# Patient Record
Sex: Male | Born: 1935 | Race: White | Hispanic: No | Marital: Married | Smoking: Former smoker
Health system: Southern US, Community
[De-identification: ages and names within clinical notes are randomized; demographics above are authoritative.]

## PROBLEM LIST (undated history)

## (undated) DIAGNOSIS — I714 Abdominal aortic aneurysm, without rupture: Secondary | ICD-10-CM

## (undated) DIAGNOSIS — R1310 Dysphagia, unspecified: Secondary | ICD-10-CM

## (undated) DIAGNOSIS — N400 Enlarged prostate without lower urinary tract symptoms: Secondary | ICD-10-CM

## (undated) DIAGNOSIS — I10 Essential (primary) hypertension: Secondary | ICD-10-CM

## (undated) DIAGNOSIS — I428 Other cardiomyopathies: Secondary | ICD-10-CM

## (undated) DIAGNOSIS — Z931 Gastrostomy status: Secondary | ICD-10-CM

## (undated) DIAGNOSIS — R7881 Bacteremia: Secondary | ICD-10-CM

## (undated) DIAGNOSIS — I82409 Acute embolism and thrombosis of unspecified deep veins of unspecified lower extremity: Secondary | ICD-10-CM

## (undated) DIAGNOSIS — R001 Bradycardia, unspecified: Secondary | ICD-10-CM

## (undated) DIAGNOSIS — C801 Malignant (primary) neoplasm, unspecified: Secondary | ICD-10-CM

## (undated) DIAGNOSIS — B965 Pseudomonas (aeruginosa) (mallei) (pseudomallei) as the cause of diseases classified elsewhere: Secondary | ICD-10-CM

## (undated) DIAGNOSIS — I498 Other specified cardiac arrhythmias: Secondary | ICD-10-CM

## (undated) DIAGNOSIS — T827XXA Infection and inflammatory reaction due to other cardiac and vascular devices, implants and grafts, initial encounter: Secondary | ICD-10-CM

## (undated) DIAGNOSIS — I359 Nonrheumatic aortic valve disorder, unspecified: Secondary | ICD-10-CM

## (undated) DIAGNOSIS — E785 Hyperlipidemia, unspecified: Secondary | ICD-10-CM

## (undated) HISTORY — PX: TRANSURETHRAL RESECTION OF PROSTATE: SHX73

## (undated) HISTORY — PX: HEMORRHOID SURGERY: SHX153

## (undated) HISTORY — PX: APPENDECTOMY: SHX54

## (undated) HISTORY — PX: JOINT REPLACEMENT: SHX530

## (undated) HISTORY — DX: Malignant (primary) neoplasm, unspecified: C80.1

## (undated) HISTORY — DX: Abdominal aortic aneurysm, without rupture: I71.4

## (undated) HISTORY — PX: HERNIA REPAIR: SHX51

## (undated) HISTORY — PX: COLECTOMY: SHX59

## (undated) HISTORY — PX: CATARACT EXTRACTION, BILATERAL: SHX1313

## (undated) HISTORY — PX: PROSTATE BIOPSY: SHX241

---

## 1988-03-15 HISTORY — PX: TOTAL KNEE ARTHROPLASTY: SHX125

## 1998-01-30 ENCOUNTER — Other Ambulatory Visit: Admission: RE | Admit: 1998-01-30 | Discharge: 1998-01-30 | Payer: Self-pay | Admitting: Urology

## 1998-03-28 ENCOUNTER — Ambulatory Visit (HOSPITAL_COMMUNITY): Admission: RE | Admit: 1998-03-28 | Discharge: 1998-03-28 | Payer: Self-pay | Admitting: Orthopedic Surgery

## 1998-03-28 ENCOUNTER — Encounter: Payer: Self-pay | Admitting: Orthopedic Surgery

## 1998-04-21 ENCOUNTER — Encounter: Admission: RE | Admit: 1998-04-21 | Discharge: 1998-07-20 | Payer: Self-pay | Admitting: Anesthesiology

## 2000-12-02 ENCOUNTER — Encounter (INDEPENDENT_AMBULATORY_CARE_PROVIDER_SITE_OTHER): Payer: Self-pay | Admitting: Specialist

## 2000-12-02 ENCOUNTER — Other Ambulatory Visit: Admission: RE | Admit: 2000-12-02 | Discharge: 2000-12-02 | Payer: Self-pay | Admitting: Urology

## 2001-07-10 ENCOUNTER — Encounter (INDEPENDENT_AMBULATORY_CARE_PROVIDER_SITE_OTHER): Payer: Self-pay

## 2001-07-10 ENCOUNTER — Ambulatory Visit (HOSPITAL_COMMUNITY): Admission: RE | Admit: 2001-07-10 | Discharge: 2001-07-10 | Payer: Self-pay | Admitting: *Deleted

## 2002-01-11 ENCOUNTER — Emergency Department (HOSPITAL_COMMUNITY): Admission: EM | Admit: 2002-01-11 | Discharge: 2002-01-11 | Payer: Self-pay | Admitting: Emergency Medicine

## 2002-12-26 ENCOUNTER — Encounter (INDEPENDENT_AMBULATORY_CARE_PROVIDER_SITE_OTHER): Payer: Self-pay | Admitting: *Deleted

## 2002-12-26 ENCOUNTER — Ambulatory Visit (HOSPITAL_COMMUNITY): Admission: RE | Admit: 2002-12-26 | Discharge: 2002-12-26 | Payer: Self-pay | Admitting: *Deleted

## 2003-02-15 ENCOUNTER — Encounter (INDEPENDENT_AMBULATORY_CARE_PROVIDER_SITE_OTHER): Payer: Self-pay | Admitting: Specialist

## 2003-02-15 ENCOUNTER — Inpatient Hospital Stay (HOSPITAL_COMMUNITY): Admission: RE | Admit: 2003-02-15 | Discharge: 2003-02-19 | Payer: Self-pay | Admitting: Urology

## 2004-02-03 ENCOUNTER — Emergency Department (HOSPITAL_COMMUNITY): Admission: EM | Admit: 2004-02-03 | Discharge: 2004-02-03 | Payer: Self-pay | Admitting: Emergency Medicine

## 2004-06-25 ENCOUNTER — Ambulatory Visit (HOSPITAL_COMMUNITY): Admission: RE | Admit: 2004-06-25 | Discharge: 2004-06-25 | Payer: Self-pay | Admitting: *Deleted

## 2004-06-25 ENCOUNTER — Encounter (INDEPENDENT_AMBULATORY_CARE_PROVIDER_SITE_OTHER): Payer: Self-pay | Admitting: *Deleted

## 2004-08-31 ENCOUNTER — Emergency Department (HOSPITAL_COMMUNITY): Admission: EM | Admit: 2004-08-31 | Discharge: 2004-08-31 | Payer: Self-pay | Admitting: *Deleted

## 2006-03-15 HISTORY — PX: BLADDER SURGERY: SHX569

## 2006-10-22 ENCOUNTER — Emergency Department (HOSPITAL_COMMUNITY): Admission: EM | Admit: 2006-10-22 | Discharge: 2006-10-22 | Payer: Self-pay | Admitting: Emergency Medicine

## 2006-12-05 ENCOUNTER — Ambulatory Visit (HOSPITAL_COMMUNITY): Admission: RE | Admit: 2006-12-05 | Discharge: 2006-12-05 | Payer: Self-pay | Admitting: Urology

## 2006-12-12 ENCOUNTER — Ambulatory Visit: Admission: RE | Admit: 2006-12-12 | Discharge: 2007-01-10 | Payer: Self-pay | Admitting: Radiation Oncology

## 2007-02-02 ENCOUNTER — Ambulatory Visit: Admission: RE | Admit: 2007-02-02 | Discharge: 2007-03-15 | Payer: Self-pay | Admitting: Radiation Oncology

## 2007-02-28 LAB — CBC WITH DIFFERENTIAL/PLATELET
BASO%: 0.6 % (ref 0.0–2.0)
EOS%: 4.6 % (ref 0.0–7.0)
LYMPH%: 28.6 % (ref 14.0–48.0)
MCHC: 34 g/dL (ref 32.0–35.9)
MCV: 84.4 fL (ref 81.6–98.0)
MONO%: 11.5 % (ref 0.0–13.0)
Platelets: 153 10*3/uL (ref 145–400)
RBC: 5.21 10*6/uL (ref 4.20–5.71)
WBC: 5.4 10*3/uL (ref 4.0–10.0)

## 2007-03-16 ENCOUNTER — Ambulatory Visit: Admission: RE | Admit: 2007-03-16 | Discharge: 2007-05-16 | Payer: Self-pay | Admitting: Radiation Oncology

## 2010-04-17 ENCOUNTER — Ambulatory Visit: Payer: Medicare Other | Admitting: Urology

## 2010-04-17 DIAGNOSIS — R350 Frequency of micturition: Secondary | ICD-10-CM

## 2010-04-17 DIAGNOSIS — Z8546 Personal history of malignant neoplasm of prostate: Secondary | ICD-10-CM

## 2010-04-17 DIAGNOSIS — R3129 Other microscopic hematuria: Secondary | ICD-10-CM

## 2010-07-31 NOTE — Procedures (Signed)
[facility]  Patient:    ADRIANA, LINA Visit Number: [id number] MRN: [id number]          Service Type: END Location: ENDO Attending Physician:  Sabino Gasser Dictated by:   Sabino Gasser, M.D. Proc. Date: 07/10/01 Admit Date:  07/10/2001                             Procedure Report  PROCEDURE:  Upper endoscopy.  INDICATION:  Gastroesophageal reflux disease.  ANESTHESIA:  Demerol 50, Versed 6 mg.  DESCRIPTION OF PROCEDURE:  With the patient mildly sedated in the left lateral decubitus position, the Olympus videoscopic endoscope was inserted into the mouth and passed under direct vision through the esophagus, which appeared normal, into the stomach.  Fundus, body, antrum, duodenal bulb, and second portion of duodenum were all visualized.  From this point, the endoscope was slowly withdrawn, taking circumferential views of the entire duodenal mucosa until the endoscope then pulled back into the stomach and placed in retroflexion to view the stomach from below.  The endoscope was then straightened and withdrawn, taking circumferential views of the remaining gastric and esophageal mucosa.  The patients vital signs and pulse oximeter remained stable.  The patient tolerated the procedure well without apparent complications.  FINDINGS:  Minimal erythema of duodenal bulb, otherwise unremarkable examination.  PLAN:  Proceed to colonoscopy. Dictated by:   Sabino Gasser, M.D. Attending Physician:  Sabino Gasser DD:  07/10/01 TD:  07/10/01 Job: [id number]

## 2010-07-31 NOTE — H&P (Signed)
NAME:  [name], POMPLUN NO.:  [id number]   MEDICAL RECORD NO.:  [id number]                   PATIENT TYPE:  INP   LOCATION:  0381                                 FACILITY:  [facility]   PHYSICIAN:  Jamison Neighbor, M.D.               DATE OF BIRTH:  07-08-1929   DATE OF ADMISSION:  02/15/2003  DATE OF DISCHARGE:                                HISTORY & PHYSICAL   CHIEF COMPLAINT:  Large prostate.   HISTORY OF PRESENT ILLNESS:  [name] is a 77 year old gentleman who has  been followed by Dr. Logan Bores for elevated PSA as well as lower urinary tract  symptoms.  The patient has a history of an elevated PSA and has undergone a  transrectal ultrasound-guided biopsy in the past.  He was found to have an  80 g prostate.  Recently the patient has complained of increasing dysuria  and frequency as well as lower urinary tract symptoms.  He also was found to  have some red cells in his urine and underwent a CT scan as part of a  hematuria workup.  The CT scan demonstrated an approximately 2 cm bladder  calculus.  Because of the patient's enlarged, prostate, lower urinary tract  symptoms, and bladder calculus, it was recommended to him that he undergo  transurethral resection of the prostate with stone removal.  The patient  consented to this and is being admitted today postoperatively for his  convalescence.   PAST MEDICAL HISTORY:  1. Morbid obesity.  2. Arthritis.  3. History of kidney stones.   PAST SURGICAL HISTORY:  1. Hemorrhoidectomy.  2. Appendectomy.  3. Four hernia surgeries.  4. Colectomy for diverticulitis.   MEDICATIONS:  1. Zocor.  2. Cardura.  3. TriCor.  4. The patient has stopped aspirin.   ALLERGIES:  PENICILLIN causes a rash.   SOCIAL HISTORY:  The patient has a significant smoking history but quit 25  years ago.   FAMILY HISTORY:  No known genitourinary disease.   REVIEW OF SYSTEMS:  As per history of present illness, otherwise  negative  times all systems.   PHYSICAL EXAMINATION:  GENERAL:  Alert and oriented x3, morbid obesity, no  acute distress.  HEENT:  Head is normocephalic, atraumatic.  Extraocular movements are  intact.  Pupils are anicteric.  NECK:  Supple without masses.  CHEST:  Lungs are clear to auscultation bilaterally.  CARDIAC:  Regular rate and rhythm without murmurs, gallops, or rubs.  ABDOMEN:  Soft, nontender, nondistended.  There are multiple scars.  GENITOURINARY:  The patient has an enlarge prostate without nodules or  induration.  EXTREMITIES:  Warm and well-perfused.  NEUROLOGIC:  No focal motor or sensory deficits are appreciated.   IMPRESSION:  A 77 year old with lower urinary tract symptoms and bladder  calculus.   PLAN:  The patient is to undergo transurethral resection of the prostate  with bladder  calculus removal.  He will be admitted to Dr. Logan Bores  postoperatively for his convalescence.     Susanne Borders, MD                           Jamison Neighbor, M.D.    DR/MEDQ  D:  02/15/2003  T:  02/15/2003  Job:  [id number]

## 2010-07-31 NOTE — Op Note (Signed)
   NAME:  [name], [name] NO.:  [id number]   MEDICAL RECORD NO.:  [id number]                   PATIENT TYPE:  AMB   LOCATION:  ENDO                                 FACILITY:  [facility]   PHYSICIAN:  Georgiana Spinner, M.D.                 DATE OF BIRTH:  01-29-1930   DATE OF PROCEDURE:  12/26/2002  DATE OF DISCHARGE:                                 OPERATIVE REPORT   PROCEDURE:  Colonoscopy.   INDICATIONS:  Colon polyps.   ANESTHESIA:  Demerol 20 mg, Versed 1 mg.   DESCRIPTION OF PROCEDURE:  With the patient mildly sedated and in the left  lateral decubitus position, the Olympus videoscopic colonoscope was inserted  in the rectum and passed under direct vision to the cecum, identified by  ileocecal valve and appendiceal orifice, both of which were photographed.  From this point,  the colonoscope was slowly withdrawn, taking  circumferential views of the colonic mucosa as we withdrew all the way to  the rectum, stopping only to photograph diverticula seen along the way, both  in the right and left colon and polyps that we saw mostly in the left colon.  Two in the sigmoid colon were adjacent.  One was removed using snare cautery  technique.  The other hot biopsy forceps technique, both the same 20/20  blended current and both were retrieved for pathology and placed in the same  specimen container.  Third polyp was seen at 20 cm from the anal verge and  it, too, was removed this time using hot biopsy forceps technique, a setting  of 20/20 blended current.  The endoscope was withdrawn into the rectum which  appeared normal and direct and retroflexed view showed hemorrhoids.  The  endoscope was straightened and withdrawn.  The patient's vital signs and  pulse oximetry remained stable.  The patient tolerated the procedure well  without apparent complications.   FINDINGS:  1. Hemorrhoids internally.  2. Diverticulosis, mild but noted throughout the colon, right  and left     equally.  3. Polyps as described above in the sigmoid colon at 20 cm from the anal     verge.   PLAN:  Await biopsy report.  The patient will call me for results and follow  up with me as an outpatient.                                               Georgiana Spinner, M.D.    GMO/MEDQ  D:  12/26/2002  T:  12/26/2002  Job:  [id number]

## 2010-07-31 NOTE — Op Note (Signed)
NAME:  [name], [name] NO.:  [id number]   MEDICAL RECORD NO.:  [id number]          PATIENT TYPE:  AMB   LOCATION:  ENDO                         FACILITY:  [facility]   PHYSICIAN:  Georgiana Spinner, M.D.    DATE OF BIRTH:  1930-01-08   DATE OF PROCEDURE:  DATE OF DISCHARGE:                                 OPERATIVE REPORT   PROCEDURE:  Colonoscopy.   ANESTHESIA:  Demerol 25, Versed 2 mg.   INDICATIONS:  Rectal bleeding.   PROCEDURE IN DETAIL:  With the patient mildly sedated in the left lateral  decubitus position, a rectal exam was performed which was unremarkable to my  view.  Subsequently the Olympus videoscopic colonoscope was inserted in the  rectum and passed rather easily to the cecum under direct vision.  The cecum  was identified by ileocecal valve and base of cecum both of which were  photographed.  From this point the colonoscope was slowly withdrawn.  __________ colonic mucosa stopping in the rectum which appeared normal and  showed hemorrhoidal tissue.  On retroflex view, the endoscope was straight  and withdrawn.  The patient's vital signs, pulse oximeter remained stable.  The patient tolerated the procedure well. There were no apparent  complications.   FINDINGS:  An occasional diverticulum was seen in the sigmoid colon and  internal hemorrhoids.  Otherwise an unremarkable colonoscope examination to  the cecum.   PLAN:  Consider repeat examination in 5 or more likely 10 years.      GMO/MEDQ  D:  06/25/2004  T:  06/25/2004  Job:  [id number]

## 2010-07-31 NOTE — Op Note (Signed)
NAME:  [name], [name] NO.:  [id number]   MEDICAL RECORD NO.:  [id number]                   PATIENT TYPE:  INP   LOCATION:  0381                                 FACILITY:  [facility]   PHYSICIAN:  Jamison Neighbor, M.D.               DATE OF BIRTH:  [birth year]   DATE OF PROCEDURE:  02/15/2003  DATE OF DISCHARGE:                                 OPERATIVE REPORT   PREOPERATIVE DIAGNOSES:  Benign prostatic hyperplasia/bladder outlet  obstruction, bladder calculus.   POSTOPERATIVE DIAGNOSES:  Benign prostatic hyperplasia/bladder outlet  obstruction, bladder calculus.   PROCEDURE:  Cystoscopy, transurethral resection of prostate, open cystostomy  with stone extraction and suprapubic tube placement.   SURGEON:  Jamison Neighbor, M.D.   ASSISTANT:  Susanne Borders, MD   ANESTHESIA:  General endotracheal.   DRAINS:  1. 24 French Foley catheter to straight drain.  2. 20 French suprapubic tube connected to continuous bladder irrigation.  3. Estimated blood loss 50 mL.   SPECIMENS:  1. Transurethral resection of the prostate, chips for pathologic analysis.  2. Bladder calculus.   COMPLICATIONS:  None.   DISPOSITION:  Post anesthesia care unit in stable condition.   INDICATIONS FOR PROCEDURE:  Mr. Gains is a 77 year old gentleman with a  long standing history of lower urinary tract symptoms.  The patient has a  known enlarged prostate from transrectal ultrasound in the past.  His  prostate is over 80 g in size.  The patient complains of urgency and  frequency and difficulty emptying his bladder.  The patient had some red  cells in his urine and was evaluated with a CT scan as part of his hematuria  workup.  Noted on the CT scan was an approximately 2 cm bladder calculus.  Because of the patient's history of lower urinary tract symptoms and known  bladder calculus with enlarged prostate. A transurethral resection of the  prostate with cystolithalopaxy was  offered to the patient.  He consented to  this after understanding the risks, benefits, and alternatives.   DESCRIPTION OF PROCEDURE:  The patient was brought to the operating room and  correctly identified by his identification bracelet. He was given  preoperative antibiotics and general endotracheal anesthesia.  He was placed  in the dorsal lithotomy position and genitalia were prepped and draped in  typical sterile fashion.  The resectoscope sheath was placed carefully  through the patient's urethra and into his bladder using the obturator.  The  12 degree lens resectoscope was connected to the sheath and the patient's  cystoscopy was performed. Bilateral ureteral orifices were seen in their  anatomic location.  The patient had marked trabeculation throughout his  bladder.  There were no mucosal abnormalities, tumors, or other  abnormalities.  Noted on the floor of the bladder was an approximately 2 cm  bladder calculus.  The adaptor was placed on the resectoscope sheath and the  cystoscope was placed within the resectoscope sheath.  The electrohydraulic  lithotripsy probe was then used and attempts were made to fragment the  calculus cystoscopically.  The stone was found to be very dense and was not  fragmenting whatsoever even with the electrohydraulic lithotriptor on  maximum setting.  At this point, a decision was made that the patient would  need to have an open cystostomy to remove the calculus.  Dr. Logan Bores had a  conversation with the patient's wife who gave consent for that procedure.   The transurethral resection of prostate was performed cystoscopically.  The  resectoscope was replaced within the sheath and the median lobe was  systematically taken down from the bladder neck to just proximal to the  verumontanum.  The tissue was resected down to the capsule an all vessels  were coagulated with a coagulation current. Next attention was turned to the  left lateral lobe and this  was taken down from approximately 2 o'clock to  the 6 o'clock position.  Again the adenoma was resected down to the level of  the capsule.  Care was taken not to resect pass the verumontanum.  This  process was repeated on the right side from approximately 6 o'clock to 11  o'clock.  Following the resection all sites of bleeding were carefully  fulgurated.  There was no damage to the external sphincter as the resection  did not pass the level of the verumontanum. The ureteral orifices were  visualized after resection and were intact.  All bleeding that was noted was  carefully coagulated with the coagulation current.  The chips were removed  through the resectoscope with a Toomey syringe.  The resectoscope was  removed and a 24 French Foley catheter was placed within the bladder.   The patient was then taken out of the lithotomy position and placed in  supine position.  He was shaved, prepped and draped above his pubis to his  umbilicus.  An approximately 8 cm infraumbilical midline incision was with a  scalpel.  The Bovie electrocautery was used to incise Camper's and Scarpa's  fascia down to the level of the rectus sheath.  The rectus sheath was  grasped on both sides with Kelly clamps and was incised in the midline with  Metzenbaum scissors for fear that the patient might have underlying bowel  beneath it. There was known bowel noted.  The fascia was incised down to the  level of the pubis.  The patient's bladder was filled slightly through his  Foley catheter with normal saline.  The bladder could be palpated and the  Foley balloon could be palpated.  Babcock clamps were placed on either side  of the midline of the bladder and an approximately 4 cm cystotomy incision  was made with the  Bovie electrocautery.  The stone was then visualized and  was easily pulled from the bladder and passed off the table.  A small stab  wound was made just inferior to our skin incision and a Vanderbilt  clamp was passed through the body wall and out of this incision.  The 20 French Foley  catheter was grasped and pulled through this incision.  A second small  incision was made in the bladder to the right of the midline cystotomy  incision.  A Vanderbilt was passed through the bladder wall and the Foley  catheter was pulled into the bladder.  The suprapubic tube was inflated with  approximately 6 mL of sterile water.  The  cystostomy incision was then  closed in two running layers of 2-0 Vicryl.  The suprapubic tube was  irrigated and the cystotomy closure was water tight. The entire wound was  then copiously irrigated and no further bleeding was noted.  Kocher clamps  were placed on either side of the rectus fascia and the fascia was closed  with a running #0 Vicryl suture.  There was no obvious fascial defect after  closure.  The skin was then irrigated once more with saline and the skin was  closed with a stapling device.  The suprapubic tube was connected to  continuous bladder irrigation and the Foley catheter was connected to  straight drain. The patient was then awakened from his anesthesia without  complications and taken to the post anesthesia care unit in stable  condition.  Please note that Dr. Logan Bores was present and participated in all  aspects of this case as he was the primary surgeon.     Susanne Borders, MD                           Jamison Neighbor, M.D.    DR/MEDQ  D:  02/15/2003  T:  02/15/2003  Job:  [id number]

## 2010-07-31 NOTE — Op Note (Signed)
   NAME:  [name], [name] NO.:  [id number]   MEDICAL RECORD NO.:  [id number]                   PATIENT TYPE:  AMB   LOCATION:  ENDO                                 FACILITY:  [facility]   PHYSICIAN:  Georgiana Spinner, M.D.                 DATE OF BIRTH:  04-Feb-1930   DATE OF PROCEDURE:  12/26/2002  DATE OF DISCHARGE:                                 OPERATIVE REPORT   PROCEDURE:  Upper endoscopy.   INDICATIONS:  GERD.   ANESTHESIA:  Demerol 60 mg, Versed 6 mg.   DESCRIPTION OF PROCEDURE:  With the patient mildly sedated in the left  lateral decubitus position, the Olympus videoscopic endoscope was inserted  in the mouth, passed under direct vision through the esophagus, which showed  changes of Barrett's esophagus distally.  Photographs and biopsies were  taken.  From this point the endoscope was then advanced into the stomach.  Fundus, body, antrum, duodenal bulb, second portion of duodenum were  visualized.  From this point the endoscope was slowly withdrawn, taking  circumferential views of the duodenal mucosa until the endoscope had been  pulled back in the stomach, placed in retroflexion to view the stomach from  below.  The endoscope was then straightened and withdrawn, taking  circumferential views of the remaining gastric and esophageal mucosa.  The  patient's vital signs and pulse oximetry remained stable.  The patient  tolerated the procedure well without apparent complications.   FINDINGS:  Barrett's esophagus, otherwise unremarkable exam.   PLAN:  Await biopsy report.  The patient will call me for results and follow  up with me as an outpatient.  Proceed to colonoscopy as planned.                                               Georgiana Spinner, M.D.    GMO/MEDQ  D:  12/26/2002  T:  12/26/2002  Job:  [id number]

## 2010-07-31 NOTE — Procedures (Signed)
[facility]  Patient:    [name], [name] Visit Number: [id number] MRN: [id number]          Service Type: END Location: ENDO Attending Physician:  Sabino Gasser Dictated by:   Sabino Gasser, M.D. Proc. Date: 07/10/01 Admit Date:  07/10/2001                             Procedure Report  PROCEDURE:  Colonoscopy.  INDICATION:  Colon polyp.  ANESTHESIA:  Demerol 10, Versed 1 mg.  DESCRIPTION OF PROCEDURE:  With the patient mildly sedated in the left lateral decubitus position, the Olympus videoscopic colonoscope was inserted into the rectum and passed under direct vision to the cecum, identified by the ileocecal valve and appendiceal orifice, both of which were photographed. From this point, the colonoscope was slowly withdrawn, taking circumferential views of the entire colonic mucosa, stopping first in the cecum where a polyp was seen, photographed, and removed using hot biopsy forceps technique setting of 20-20 blended current.  A second polyp was seen in the rectum, and it too was removed using hot biopsy forceps technique setting of 20-20 blended current.  The rectum was viewed on direct and retroflexed view.  The patients vital signs and pulse oximeter remained stable.  The patient tolerated the procedure well without apparent complications.  FINDINGS: 1. Polyp at cecum. 2. Polyp at rectum.  PLAN: 1. Await biopsy report. 2. The patient will call me for results and follow up with me as an    outpatient. Dictated by:   Sabino Gasser, M.D. Attending Physician:  Sabino Gasser DD:  07/10/01 TD:  07/10/01 Job: [id number]

## 2010-07-31 NOTE — Op Note (Signed)
NAME:  EARLEY, GROBE NO.:  [id number]   MEDICAL RECORD NO.:  [id number]          PATIENT TYPE:  AMB   LOCATION:  ENDO                         FACILITY:  [facility]   PHYSICIAN:  Georgiana Spinner, M.D.    DATE OF BIRTH:  March 19, 1929   DATE OF PROCEDURE:  06/25/2004  DATE OF DISCHARGE:                                 OPERATIVE REPORT   PROCEDURE:  Upper endoscopy.   INDICATIONS:  GERD.   ANESTHESIA:  Demerol 50 mg and Versed 6 mg.   DESCRIPTION OF PROCEDURE:  With the patient mildly sedated in the left  lateral decubitus position, the Olympus videoscopic endoscope was inserted  in the mouth and passed under direct vision through the esophagus, which  appeared normal.  The distal esophagus was approach and the squamocolumnar  junction could not be well seen due to the anatomy of this area, but I was  able to view parts of the squamocolumnar junction and took biopsies around  the perimeter.  We were then able to enter into the stomach.  The fundus,  body, antrum, duodenal bulb and second portion of the duodenum all appeared  normal.  From this point, the endoscope was slowly withdrawn, taking  circumferential views of the duodenal mucosa until the endoscope had been  pulled back into the stomach and placed in retroflexion to view the stomach  from below.  The endoscope was then straightened and withdrawn, taking  circumferential views of the remaining gastric and esophageal mucosa.  The  patient's vital signs and pulse oximetry remained stable.  The patient  tolerated the procedure well without apparent complications.   FINDINGS:  Unremarkable examination with biopsies taken of the  squamocolumnar junction.  Await biopsy report.  The patient will call me for  results and follow up with me as an outpatient.      GMO/MEDQ  D:  06/25/2004  T:  06/25/2004  Job:  [id number]

## 2010-07-31 NOTE — Discharge Summary (Signed)
NAME:  [name], [name] NO.:  [id number]   MEDICAL RECORD NO.:  [id number]                   PATIENT TYPE:  INP   LOCATION:  0381                                 FACILITY:  [facility]   PHYSICIAN:  Jamison Neighbor, M.D.               DATE OF BIRTH:  06-Mar-1930   DATE OF ADMISSION:  02/15/2003  DATE OF DISCHARGE:  02/19/2003                                 DISCHARGE SUMMARY   DISCHARGE DIAGNOSES:  1. Benign prostatic hypertrophy with bladder outlet obstruction.  2. Bladder calculi.  3. Hypertension.  4. Morbid obesity.  5. History of tobacco use.   PRINCIPAL PROCEDURE:  1. Transurethral resection of the prostate.  2. Cystotomy with removal of bladder calculus.   HISTORY:  This 77 year old male was found to have an elevated PSA.  He  underwent a biopsy which was negative.  He was found to have a large  prostate approximately 88 g in size.  The patient developed increasing  problems with dysuria, urgency, and frequency as well as some problems with  hematuria.  CT scan showed a stone in the bladder and cystoscopic  examination revealed evidence of bladder outlet obstruction.  The patient  was scheduled to undergo a TURP as well as removal of the bladder calculus.   PAST MEDICAL HISTORY:  1. Arthritis.  2. Morbid obesity.  3. Past history of renal calculi.   PAST SURGICAL HISTORY:  1. Hemorrhoidectomy.  2. Appendectomy.  3. Hernia surgery x4.  4. Colectomy for diverticulitis.   CURRENT MEDICATIONS:  1. Zocor.  2. Cardura.  3. TriCor.   ALLERGIES:  PENICILLIN.   PHYSICAL EXAMINATION:  RECTAL:  Prostatic enlargement.  GENERAL:  Significant obesity.   HOSPITAL COURSE:  The patient was taken to the operating room and underwent  cystoscopy and TURP.  The patient's stone could not be fragmented and for  that reason we obtained permission to make a small incision to remove the  stone.  This was done and a suprapubic tube was placed.   The patient  did well postoperatively.  He was advanced to a regular diet.  His Foley catheter was removed on the second postoperative day.  The patient  was thought to be ready for discharge on postoperative day #3.  He was going  to go home with his suprapubic tube in place in case he had any problems  with postoperative retention.  He did have a terrible problem with  constipation and required magnesium citrate.  Once the patient finally had a  bowel movement he felt appreciably better and was able to go home on  February 19, 2003.  He will return in several days for removal of the  suprapubic tube.  The patient will be checking postvoid residuals in the  postoperative period and we will monitor that when he returns in follow-up.  Jamison Neighbor, M.D.    RJE/MEDQ  D:  02/26/2003  T:  02/26/2003  Job:  [id number]

## 2010-09-02 ENCOUNTER — Ambulatory Visit (HOSPITAL_COMMUNITY)
Admission: RE | Admit: 2010-09-02 | Discharge: 2010-09-02 | Disposition: A | Discharge status: Home / Self Care | Payer: Medicare Other | Source: Ambulatory Visit | Attending: Internal Medicine | Admitting: Internal Medicine

## 2010-09-02 DIAGNOSIS — R011 Cardiac murmur, unspecified: Secondary | ICD-10-CM | POA: Insufficient documentation

## 2010-09-02 DIAGNOSIS — I10 Essential (primary) hypertension: Secondary | ICD-10-CM | POA: Insufficient documentation

## 2010-09-02 DIAGNOSIS — I359 Nonrheumatic aortic valve disorder, unspecified: Secondary | ICD-10-CM

## 2010-10-23 ENCOUNTER — Ambulatory Visit (INDEPENDENT_AMBULATORY_CARE_PROVIDER_SITE_OTHER): Payer: Medicare Other | Admitting: Urology

## 2010-10-23 DIAGNOSIS — R3129 Other microscopic hematuria: Secondary | ICD-10-CM

## 2010-10-23 DIAGNOSIS — Z8546 Personal history of malignant neoplasm of prostate: Secondary | ICD-10-CM

## 2010-10-23 DIAGNOSIS — R350 Frequency of micturition: Secondary | ICD-10-CM

## 2010-11-09 ENCOUNTER — Encounter: Payer: Self-pay | Admitting: Emergency Medicine

## 2010-11-09 ENCOUNTER — Emergency Department (HOSPITAL_COMMUNITY)
Admission: EM | Admit: 2010-11-09 | Discharge: 2010-11-09 | Disposition: A | Discharge status: Home / Self Care | Payer: Medicare Other | Attending: Emergency Medicine | Admitting: Emergency Medicine

## 2010-11-09 DIAGNOSIS — IMO0001 Reserved for inherently not codable concepts without codable children: Secondary | ICD-10-CM | POA: Insufficient documentation

## 2010-11-09 DIAGNOSIS — E785 Hyperlipidemia, unspecified: Secondary | ICD-10-CM | POA: Insufficient documentation

## 2010-11-09 DIAGNOSIS — M791 Myalgia, unspecified site: Secondary | ICD-10-CM

## 2010-11-09 DIAGNOSIS — I10 Essential (primary) hypertension: Secondary | ICD-10-CM | POA: Insufficient documentation

## 2010-11-09 HISTORY — DX: Benign prostatic hyperplasia without lower urinary tract symptoms: N40.0

## 2010-11-09 HISTORY — DX: Hyperlipidemia, unspecified: E78.5

## 2010-11-09 HISTORY — DX: Essential (primary) hypertension: I10

## 2010-11-09 MED ORDER — HYDROCODONE-ACETAMINOPHEN 5-325 MG PO TABS: 1.0000 | ORAL_TABLET | ORAL | Status: AC | PRN

## 2010-11-09 MED ORDER — CYCLOBENZAPRINE HCL 10 MG PO TABS: 10.0000 mg | ORAL_TABLET | Freq: Two times a day (BID) | ORAL | Status: AC | PRN

## 2010-11-09 NOTE — ED Provider Notes (Signed)
History   Chart scribed for Shelda Jakes, MD by Enos Fling; the patient was seen in room APA04/APA04; this patient's care was started at 10:22 AM.    CSN: [id number] Arrival date & time: 11/09/2010 10:00 AM  Chief Complaint  Patient presents with  . Headache   HPI [name] is a 77 y.o. male who presents to the Emergency Department complaining of head pain. Pt reports pain to right posterior head pain behind ear that has been intermittent x 1 week. Pt states pain comes and goes for various amounts time and is not associated with anything. Pain is relieved by tylenol, palpation, and rest. Pain is not worse with movement of head. Pt reports same pain 10-12 years ago, had head CT and dx pulled muscle. No headache, n/v, numbness, tingling, neck pain, change in vision, or dizziness.  PCP Thea Silversmith  Past Medical History  Diagnosis Date  . Hypertension   . BPH (benign prostatic hyperplasia)   . Hyperlipidemia     Past Surgical History  Procedure Date  . Abdominal surgery   . Appendectomy   . Hernia repair   . Knee surgery     History reviewed. No pertinent family history.  History  Substance Use Topics  . Smoking status: Not on file  . Smokeless tobacco: Not on file  . Alcohol Use:    Previous Medications   ACETAMINOPHEN (TYLENOL) 500 MG TABLET    Take 1,000 mg by mouth every 6 (six) hours as needed. For pain/fever    DEXBROMPHENIRAMINE-PSEUDOEPHEDRINE (DRIXORAL) 6-120 MG PER TABLET    Take 1 tablet by mouth daily.     DOXAZOSIN (CARDURA) 8 MG TABLET    Take 8 mg by mouth at bedtime.     SIMVASTATIN (ZOCOR) 80 MG TABLET    Take 80 mg by mouth at bedtime.       Allergies as of 11/09/2010 - Review Complete 11/09/2010  Allergen Reaction Noted  . Penicillins Other (See Comments) 11/09/2010       Review of Systems  Constitutional: Negative for fever and chills.  HENT: Negative for ear pain, congestion, rhinorrhea and neck pain.   Eyes: Negative for  photophobia, pain, redness and visual disturbance.  Respiratory: Negative for cough and shortness of breath.   Cardiovascular: Negative for chest pain and leg swelling.  Gastrointestinal: Negative for nausea, vomiting, abdominal pain and diarrhea.  Genitourinary: Negative for flank pain.  Musculoskeletal: Negative for back pain.       Head pain  Skin: Negative for rash.  Neurological: Negative for dizziness, weakness, numbness and headaches.    Physical Exam  BP 123/80  Pulse 78  Temp(Src) 98 F (36.7 C) (Oral)  Resp 18  Ht 6\' 2"  (1.88 m)  Wt 300 lb (136.079 kg)  BMI 38.52 kg/m2  SpO2 95%  Physical Exam  Nursing note and vitals reviewed. Constitutional: He is oriented to person, place, and time. He appears well-developed and well-nourished. No distress.  HENT:  Head: Normocephalic and atraumatic.       Bilateral TMs clear   Eyes: Conjunctivae and EOM are normal. Pupils are equal, round, and reactive to light.  Neck: Normal range of motion. Neck supple.       nontender  Cardiovascular: Normal rate, regular rhythm and normal heart sounds.   Pulmonary/Chest: Effort normal and breath sounds normal. He has no wheezes. He exhibits no tenderness.  Abdominal: Soft. Bowel sounds are normal. There is no tenderness.  Musculoskeletal: Normal range of motion. He  exhibits no edema and no tenderness.  Lymphadenopathy:    He has no cervical adenopathy.  Neurological: He is alert and oriented to person, place, and time. He has normal strength. No cranial nerve deficit or sensory deficit.  Skin: Skin is warm and dry. No rash noted.  Psychiatric: He has a normal mood and affect.    Procedures - none  OTHER DATA REVIEWED: Nursing notes and vital signs reviewed.   ED COURSE: 12:11 PM  Pt resting comfortably, c/o mild pain currently; discussed with pt plan for discharge with PCP f/u in one week if pain does not resolve with muscle relaxants. Pt understands and  agrees.  MDM: SPECIFICALLY ETIOLOGY NOT CLEAR BUT LONG STANDING POSSIBLE MUSCLE STRAIN.   IMPRESSION: 1. Muscle pain      PLAN: discharge All results reviewed and discussed with pt, questions answered, pt agreeable with plan.   CONDITION ON DISCHARGE: stable   MEDS GIVEN IN ED: none  DISCHARGE MEDICATIONS: New Prescriptions   CYCLOBENZAPRINE (FLEXERIL) 10 MG TABLET    Take 1 tablet (10 mg total) by mouth 2 (two) times daily as needed for muscle spasms.   HYDROCODONE-ACETAMINOPHEN (NORCO) 5-325 MG PER TABLET    Take 1-2 tablets by mouth every 4 (four) hours as needed for pain.     SCRIBE ATTESTATION: I personally performed the services described in this documentation, which was scribed in my presence. The recorded information has been reviewed and considered. No att. providers found        Shelda Jakes, MD 11/20/10 [phone]

## 2010-11-09 NOTE — ED Notes (Signed)
Pt c/o head pain behind the rt ear x one week. Pt denies any n/v or vision changes.

## 2010-11-09 NOTE — ED Notes (Signed)
Pt c/o pain in the right lower side of his head off and on x 1 week. Denies injury, dizziness, blurred vision, weakness, nausea or vomiting. Pt states that he had pain like this several years ago and it was a pulled muscle.

## 2010-11-12 ENCOUNTER — Emergency Department (HOSPITAL_COMMUNITY)
Admission: EM | Admit: 2010-11-12 | Discharge: 2010-11-12 | Disposition: A | Discharge status: Home / Self Care | Payer: Medicare Other | Attending: Emergency Medicine | Admitting: Emergency Medicine

## 2010-11-12 ENCOUNTER — Emergency Department (HOSPITAL_COMMUNITY): Payer: Medicare Other

## 2010-11-12 DIAGNOSIS — R51 Headache: Secondary | ICD-10-CM | POA: Insufficient documentation

## 2010-11-12 DIAGNOSIS — I10 Essential (primary) hypertension: Secondary | ICD-10-CM | POA: Insufficient documentation

## 2010-11-12 DIAGNOSIS — Z79899 Other long term (current) drug therapy: Secondary | ICD-10-CM | POA: Insufficient documentation

## 2012-02-25 ENCOUNTER — Ambulatory Visit (INDEPENDENT_AMBULATORY_CARE_PROVIDER_SITE_OTHER): Payer: Medicare Other | Admitting: Urology

## 2012-02-25 ENCOUNTER — Other Ambulatory Visit: Payer: Self-pay | Admitting: Urology

## 2012-02-25 DIAGNOSIS — R350 Frequency of micturition: Secondary | ICD-10-CM

## 2012-02-25 DIAGNOSIS — C61 Malignant neoplasm of prostate: Secondary | ICD-10-CM

## 2012-02-25 DIAGNOSIS — R3129 Other microscopic hematuria: Secondary | ICD-10-CM

## 2012-03-02 ENCOUNTER — Ambulatory Visit (HOSPITAL_COMMUNITY)
Admission: RE | Admit: 2012-03-02 | Discharge: 2012-03-02 | Disposition: A | Discharge status: Home / Self Care | Payer: Medicare Other | Source: Ambulatory Visit | Attending: Urology | Admitting: Urology

## 2012-03-02 DIAGNOSIS — N400 Enlarged prostate without lower urinary tract symptoms: Secondary | ICD-10-CM | POA: Insufficient documentation

## 2012-03-02 DIAGNOSIS — I714 Abdominal aortic aneurysm, without rupture, unspecified: Secondary | ICD-10-CM | POA: Insufficient documentation

## 2012-03-02 DIAGNOSIS — R3129 Other microscopic hematuria: Secondary | ICD-10-CM

## 2012-03-02 DIAGNOSIS — N200 Calculus of kidney: Secondary | ICD-10-CM | POA: Insufficient documentation

## 2012-03-02 MED ORDER — IOHEXOL 300 MG/ML  SOLN
125.0000 mL | Freq: Once | INTRAMUSCULAR | Status: AC | PRN
  Administered 2012-03-02: 125 mL via INTRAVENOUS

## 2012-03-24 ENCOUNTER — Encounter: Payer: Medicare Other | Admitting: Vascular Surgery

## 2012-03-27 ENCOUNTER — Encounter: Payer: Self-pay | Admitting: Vascular Surgery

## 2012-03-28 ENCOUNTER — Ambulatory Visit (INDEPENDENT_AMBULATORY_CARE_PROVIDER_SITE_OTHER): Payer: Medicare Other | Admitting: Vascular Surgery

## 2012-03-28 ENCOUNTER — Telehealth: Payer: Self-pay | Admitting: *Deleted

## 2012-03-28 ENCOUNTER — Encounter: Payer: Self-pay | Admitting: Vascular Surgery

## 2012-03-28 VITALS — BP 95/74 | HR 99 | Ht 74.0 in | Wt 283.9 lb

## 2012-03-28 DIAGNOSIS — I714 Abdominal aortic aneurysm, without rupture: Secondary | ICD-10-CM

## 2012-03-28 NOTE — Telephone Encounter (Signed)
Left Message for Hedy Jacob at the [facility] vascular referral [facility] regarding Dr Early wanted Dr Bennie Pierini to see Mr Schellenberg. I have requested Jeani Hawking to send a CD of the patients recent CT ABD & pelvis to [facility] also. I faxed todays notes the imaging report and lab to [phone] attn:[email or web address]

## 2012-03-28 NOTE — Progress Notes (Signed)
[facility] [place]   Patient name: [name] MRN: [id number] DOB: 1929/10/13 Sex: male   Referred by: Annabell Howells  Reason for referral:  Chief Complaint  Patient presents with  . AAA    New pt. 5.7cm AAA referaal from Dr. Bjorn Pippin     HISTORY OF PRESENT ILLNESS: The patient is a very pleasant 79 year old gentleman with prior history of prostate cancer. He recently had a CT scan for evaluation of microhematuria. This revealed aortic aneurysm and iliac artery aneurysm and I am seeing him today for further discussion of this. He had no prior known history of this. He does not have a family history of aneurysm. He does have a prior history of colon resection for diverticulitis approximately 10 years ago. Also had a history of surgery for a bladder stone. He denies any cardiac disease and is a nonsmoker since 21  Past Medical History  Diagnosis Date  . Hypertension   . BPH (benign prostatic hyperplasia)   . Hyperlipidemia   . AAA (abdominal aortic aneurysm)   . Cancer     prostate    Past Surgical History  Procedure Date  . Abdominal surgery   . Appendectomy   . Hernia repair   . Bladder surgery 2008  . Total knee arthroplasty 1990    left    History   Social History  . Marital Status: Married    Spouse Name: N/A    Number of Children: N/A  . Years of Education: N/A   Occupational History  . Not on file.   Social History Main Topics  . Smoking status: Former Smoker    Types: Cigarettes    Quit date: 03/15/1985  . Smokeless tobacco: Never Used  . Alcohol Use: No  . Drug Use: No  . Sexually Active: Not on file   Other Topics Concern  . Not on file   Social History Narrative  . No narrative on file    History reviewed. No pertinent family history.  Allergies as of 03/28/2012 - Review Complete 03/28/2012  Allergen Reaction Noted  . Penicillins Other (See Comments) 11/09/2010    Current Outpatient Prescriptions on File Prior to  Visit  Medication Sig Dispense Refill  . acetaminophen (TYLENOL) 500 MG tablet Take 1,000 mg by mouth every 6 (six) hours as needed. For pain/fever       . dexbrompheniramine-pseudoephedrine (DRIXORAL) 6-120 MG per tablet Take 1 tablet by mouth daily.        Marland Kitchen doxazosin (CARDURA) 8 MG tablet Take 8 mg by mouth at bedtime.        . simvastatin (ZOCOR) 80 MG tablet Take 80 mg by mouth at bedtime.           REVIEW OF SYSTEMS:  Positives indicated with an "X"  CARDIOVASCULAR:  [ ]  chest pain   [ ]  chest pressure   [ ]  palpitations   [ ]  orthopnea   [ ]  dyspnea on exertion   [ ]  claudication   [ ]  rest pain   [ ]  DVT   [ ]  phlebitis PULMONARY:   [ ]  productive cough   [ ]  asthma   [ ]  wheezing NEUROLOGIC:   [ ]  weakness  [ ]  paresthesias  [ ]  aphasia  [ ]  amaurosis  [ ]  dizziness HEMATOLOGIC:   [ ]  bleeding problems   [ ]  clotting disorders MUSCULOSKELETAL:  [ ]  joint pain   [ ]  joint swelling GASTROINTESTINAL: [ ]   blood in stool  [ ]   hematemesis GENITOURINARY:  [ ]   dysuria  [ ]   hematuria PSYCHIATRIC:  [ ]  history of major depression INTEGUMENTARY:  [ ]  rashes  [ ]  ulcers CONSTITUTIONAL:  [ ]  fever   [ ]  chills  PHYSICAL EXAMINATION:  General: The patient is a well-nourished male, in no acute distress. Vital signs are BP 95/74  Pulse 99  Ht 6\' 2"  (1.88 m)  Wt 283 lb 14.4 oz (128.776 kg)  BMI 36.45 kg/m2  SpO2 96% Pulmonary: There is a good air exchange bilaterally without wheezing or rales. Abdomen: Soft and non-tender with normal pitch bowel sounds. He does have a well-healed lower midline incision. I do not palpate an aneurysm Musculoskeletal: There are no major deformities.  There is no significant extremity pain. Neurologic: No focal weakness or paresthesias are detected, Skin: There are no ulcer or rashes noted. Psychiatric: The patient has normal affect. Cardiovascular: There is a regular rate and rhythm without significant murmur appreciated. Pulse status: 2+ radial 2+  femoral 2+ popliteal 2+ posterior tibial pulses with no evidence of peripheral aneurysm Carotid arteries without bruits bilaterally   I have reviewed his CT scan and discuss this with the patient. This does show a 5.7 cm thoracoabdominal aneurysm at the level of the celiac artery and extending down to the level of the superior mesenteric artery. He does have arteriomegaly in his thoracic aorta. The aorta is slightly enlarged at the level of the renal arteries. He then has a large common iliac artery on the right at approximately 4.3 cm. This extends to the iliac bifurcation with no evidence of aneurysm in his internal or external iliac arteries bilaterally.  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  I discussed the difficulty in treating his thoracoabdominal aneurysm with options being open thoracoabdominal repair or potentially fenestrated stent graft repair. I have suggested referral to Dr. Pattricia Boss at [facility]. I explained the options would be continued observation versus stent graft repair versus open repair. I explained the additional treatment options available to Dr.Farber. We will coordinate referral and see him again on an as-needed basis    [name] [facility] [place] Office: [phone]

## 2012-03-31 ENCOUNTER — Ambulatory Visit (INDEPENDENT_AMBULATORY_CARE_PROVIDER_SITE_OTHER): Payer: Medicare Other | Admitting: Urology

## 2012-03-31 DIAGNOSIS — N200 Calculus of kidney: Secondary | ICD-10-CM

## 2012-03-31 DIAGNOSIS — R3129 Other microscopic hematuria: Secondary | ICD-10-CM

## 2012-03-31 DIAGNOSIS — C61 Malignant neoplasm of prostate: Secondary | ICD-10-CM

## 2012-03-31 DIAGNOSIS — N3040 Irradiation cystitis without hematuria: Secondary | ICD-10-CM

## 2012-04-15 DIAGNOSIS — I714 Abdominal aortic aneurysm, without rupture, unspecified: Secondary | ICD-10-CM

## 2012-04-15 HISTORY — DX: Abdominal aortic aneurysm, without rupture: I71.4

## 2012-04-15 HISTORY — DX: Abdominal aortic aneurysm, without rupture, unspecified: I71.40

## 2012-04-15 HISTORY — PX: ABDOMINAL AORTIC ANEURYSM REPAIR: SUR1152

## 2012-05-19 ENCOUNTER — Emergency Department (HOSPITAL_COMMUNITY): Payer: Medicare Other

## 2012-05-19 ENCOUNTER — Inpatient Hospital Stay (HOSPITAL_COMMUNITY): Payer: Medicare Other

## 2012-05-19 ENCOUNTER — Inpatient Hospital Stay (HOSPITAL_COMMUNITY)
Admission: EM | Admit: 2012-05-19 | Discharge: 2012-05-21 | DRG: 554 | Disposition: A | Discharge status: Home / Self Care | Payer: Medicare Other | Attending: Family Medicine | Admitting: Family Medicine

## 2012-05-19 ENCOUNTER — Encounter (HOSPITAL_COMMUNITY): Payer: Self-pay

## 2012-05-19 DIAGNOSIS — N390 Urinary tract infection, site not specified: Secondary | ICD-10-CM

## 2012-05-19 DIAGNOSIS — R29898 Other symptoms and signs involving the musculoskeletal system: Secondary | ICD-10-CM

## 2012-05-19 DIAGNOSIS — Z880 Allergy status to penicillin: Secondary | ICD-10-CM

## 2012-05-19 DIAGNOSIS — Z96659 Presence of unspecified artificial knee joint: Secondary | ICD-10-CM

## 2012-05-19 DIAGNOSIS — Z7982 Long term (current) use of aspirin: Secondary | ICD-10-CM

## 2012-05-19 DIAGNOSIS — I714 Abdominal aortic aneurysm, without rupture, unspecified: Secondary | ICD-10-CM

## 2012-05-19 DIAGNOSIS — I10 Essential (primary) hypertension: Secondary | ICD-10-CM

## 2012-05-19 DIAGNOSIS — Z6835 Body mass index (BMI) 35.0-35.9, adult: Secondary | ICD-10-CM

## 2012-05-19 DIAGNOSIS — Z79899 Other long term (current) drug therapy: Secondary | ICD-10-CM

## 2012-05-19 DIAGNOSIS — M1710 Unilateral primary osteoarthritis, unspecified knee: Principal | ICD-10-CM | POA: Diagnosis present

## 2012-05-19 DIAGNOSIS — R531 Weakness: Secondary | ICD-10-CM

## 2012-05-19 DIAGNOSIS — Z8546 Personal history of malignant neoplasm of prostate: Secondary | ICD-10-CM

## 2012-05-19 DIAGNOSIS — E669 Obesity, unspecified: Secondary | ICD-10-CM | POA: Diagnosis present

## 2012-05-19 DIAGNOSIS — Z87891 Personal history of nicotine dependence: Secondary | ICD-10-CM

## 2012-05-19 LAB — URINE MICROSCOPIC-ADD ON

## 2012-05-19 LAB — BASIC METABOLIC PANEL
CO2: 27 mEq/L (ref 19–32)
Calcium: 9.7 mg/dL (ref 8.4–10.5)
Chloride: 99 mEq/L (ref 96–112)
GFR calc Af Amer: 76 mL/min — ABNORMAL LOW (ref >90)
Sodium: 135 mEq/L (ref 135–145)

## 2012-05-19 LAB — CBC WITH DIFFERENTIAL/PLATELET
Basophils Absolute: 0 10*3/uL (ref 0.0–0.1)
Lymphocytes Relative: 6 % — ABNORMAL LOW (ref 12–46)
Neutro Abs: 11.6 10*3/uL — ABNORMAL HIGH (ref 1.7–7.7)
Platelets: 187 10*3/uL (ref 150–400)
RDW: 14 % (ref 11.5–15.5)
WBC: 13.4 10*3/uL — ABNORMAL HIGH (ref 4.0–10.5)

## 2012-05-19 LAB — URINALYSIS, ROUTINE W REFLEX MICROSCOPIC
Glucose, UA: NEGATIVE mg/dL
Nitrite: POSITIVE — AB
Protein, ur: 30 mg/dL — AB

## 2012-05-19 LAB — PROTIME-INR: Prothrombin Time: 14.8 seconds (ref 11.6–15.2)

## 2012-05-19 MED ORDER — ATORVASTATIN CALCIUM 40 MG PO TABS
40.0000 mg | ORAL_TABLET | Freq: Every day | ORAL | Status: DC
  Administered 2012-05-19 – 2012-05-20 (×2): 40 mg via ORAL
  Filled 2012-05-19 (×3): qty 1

## 2012-05-19 MED ORDER — ONDANSETRON HCL 4 MG PO TABS: 4.0000 mg | ORAL_TABLET | Freq: Four times a day (QID) | ORAL | Status: DC | PRN

## 2012-05-19 MED ORDER — ONDANSETRON HCL 4 MG/2ML IJ SOLN: 4.0000 mg | Freq: Four times a day (QID) | INTRAMUSCULAR | Status: DC | PRN

## 2012-05-19 MED ORDER — ACETAMINOPHEN 500 MG PO TABS
1000.0000 mg | ORAL_TABLET | Freq: Four times a day (QID) | ORAL | Status: DC | PRN
  Administered 2012-05-20: 1000 mg via ORAL
  Filled 2012-05-19: qty 2

## 2012-05-19 MED ORDER — IOHEXOL 350 MG/ML SOLN
100.0000 mL | Freq: Once | INTRAVENOUS | Status: AC | PRN
  Administered 2012-05-19: 100 mL via INTRAVENOUS

## 2012-05-19 MED ORDER — DEXTROSE 5 % IV SOLN
1.0000 g | Freq: Once | INTRAVENOUS | Status: AC
  Administered 2012-05-19: 1 g via INTRAVENOUS
  Filled 2012-05-19: qty 10

## 2012-05-19 MED ORDER — ASPIRIN EC 81 MG PO TBEC
81.0000 mg | DELAYED_RELEASE_TABLET | Freq: Every day | ORAL | Status: DC
  Administered 2012-05-19 – 2012-05-21 (×3): 81 mg via ORAL
  Filled 2012-05-19 (×3): qty 1

## 2012-05-19 MED ORDER — OXYCODONE-ACETAMINOPHEN 5-325 MG PO TABS
1.0000 | ORAL_TABLET | Freq: Once | ORAL | Status: AC
  Administered 2012-05-19: 1 via ORAL
  Filled 2012-05-19: qty 1

## 2012-05-19 MED ORDER — ASPIRIN 81 MG PO CHEW
CHEWABLE_TABLET | ORAL | Status: AC
  Filled 2012-05-19: qty 1

## 2012-05-19 MED ORDER — TRAZODONE HCL 50 MG PO TABS
50.0000 mg | ORAL_TABLET | Freq: Every evening | ORAL | Status: DC | PRN
  Administered 2012-05-19: 50 mg via ORAL
  Filled 2012-05-19 (×2): qty 1

## 2012-05-19 MED ORDER — SODIUM CHLORIDE 0.9 % IV SOLN
INTRAVENOUS | Status: DC
  Administered 2012-05-19 – 2012-05-20 (×2): via INTRAVENOUS

## 2012-05-19 MED ORDER — DEXTROSE 5 % IV SOLN
1.0000 g | INTRAVENOUS | Status: DC
  Filled 2012-05-19 (×2): qty 10

## 2012-05-19 MED ORDER — DOXAZOSIN MESYLATE 8 MG PO TABS
8.0000 mg | ORAL_TABLET | Freq: Every day | ORAL | Status: DC
  Administered 2012-05-19 – 2012-05-20 (×2): 8 mg via ORAL
  Filled 2012-05-19 (×3): qty 1

## 2012-05-19 MED ORDER — DEXBROMPHENIRAMINE-PSE ER 6-120 MG PO TB12: 1.0000 | ORAL_TABLET | Freq: Every day | ORAL | Status: DC

## 2012-05-19 MED ORDER — DEXTROSE 5 % IV SOLN
1.0000 g | INTRAVENOUS | Status: DC
  Administered 2012-05-20: 1 g via INTRAVENOUS
  Filled 2012-05-19 (×5): qty 10

## 2012-05-19 NOTE — ED Notes (Signed)
Daughter states she wants her dad's stents checked out while he is here. States he is to have a CT next Thursday at [place]. States they do not feel comfortable taking him home.

## 2012-05-19 NOTE — ED Notes (Signed)
Pt sitting on side of bed with family present. Pt refuses to attempt to stand or ambulate, states he knows he is too week.

## 2012-05-19 NOTE — ED Notes (Signed)
Pt waiting to be reeval and disposition 

## 2012-05-19 NOTE — ED Notes (Signed)
Daughter states pt's right knee keeps giving out. States he needs to have surgery but it had to be postponed

## 2012-05-19 NOTE — H&P (Signed)
[facility] History and Physical  [name] ZOX:[id number] DOB: 15-Mar-1930 DOA: 05/19/2012  Referring physician: Dr. Adriana Simas, ER physician. PCP: Thayer Headings, MD    Chief Complaint: Weakness, inability to walk.  HPI: [name] is a 79 y.o. male who presents to the emergency room with sudden onset of weakness this morning and inability to get out of his bed and walk. He had been walking and driving his car yesterday. He denies any arm weakness, speech difficulties. He denies any fall or injury yesterday. He did have repair of abdominal aortic aneurysm 3 weeks ago at [facility]. This procedure was apparently done under laparoscopy.   Review of Systems: Apart from history of present illness, other systems negative.  Past Medical History  Diagnosis Date  . Hypertension   . BPH (benign prostatic hyperplasia)   . Hyperlipidemia   . AAA (abdominal aortic aneurysm)   . Cancer     prostate   Past Surgical History  Procedure Laterality Date  . Abdominal surgery    . Appendectomy    . Hernia repair    . Bladder surgery  2008  . Total knee arthroplasty  1990    left   Social History:  reports that he quit smoking about 27 years ago. His smoking use included Cigarettes. He smoked 0.00 packs per day. He has never used smokeless tobacco. He reports that he does not drink alcohol or use illicit drugs.    Allergies  Allergen Reactions  . Penicillins Other (See Comments)    Unknown. Childhood allergy    No family history on file. noncontributory.   Prior to Admission medications   Medication Sig Start Date End Date Taking? Authorizing Provider  acetaminophen (TYLENOL) 500 MG tablet Take 1,000 mg by mouth every 6 (six) hours as needed. For pain/fever    Yes Historical Provider, MD  aspirin EC 81 MG tablet Take 81 mg by mouth daily.   Yes Historical Provider, MD  dexbrompheniramine-pseudoephedrine (DRIXORAL) 6-120 MG per tablet Take 1 tablet by mouth daily.     Yes  Historical Provider, MD  doxazosin (CARDURA) 8 MG tablet Take 8 mg by mouth at bedtime.     Yes Historical Provider, MD  simvastatin (ZOCOR) 80 MG tablet Take 80 mg by mouth at bedtime.     Yes Historical Provider, MD   Physical Exam: Filed Vitals:   05/19/12 1255 05/19/12 1510 05/19/12 1648 05/19/12 1650  BP: 130/80 128/76 120/73 125/77  Pulse: 93 95 100 101  Temp:      TempSrc:      Resp:  18    SpO2: 98% 96%       General:  He looks systemically well. Is not toxic or septic.  Eyes: No pallor. No jaundice.  ENT: No abnormalities.  Neck: No lymphadenopathy.  Cardiovascular: Heart sounds are present and appears to have a resting tachycardia, regular.  Respiratory: Lung fields are clear.  Abdomen: Soft, nontender.  Skin: No rash.  Musculoskeletal: Right knee swelling and tender. Unable to extend.  Psychiatric: Appropriate affect.  Neurologic: His right leg in particular is weak, he is unable to flex his right leg. He is unable to stand on it. He is scared to stand on the left leg even though that has some strength although it is also slightly weak, 4 minus over 5. There is no specific temperature difference in either leg.  Labs on Admission:  Basic Metabolic Panel:  Recent Labs Lab 05/19/12 1232  NA 135  K 4.0  CL 99  CO2 27  GLUCOSE 134*  BUN 20  CREATININE 1.04  CALCIUM 9.7       CBC:  Recent Labs Lab 05/19/12 1232  WBC 13.4*  NEUTROABS 11.6*  HGB 13.2  HCT 39.8  MCV 85.6  PLT 187   Cardiac Enzymes: No results found for this basename: CKTOTAL, CKMB, CKMBINDEX, TROPONINI,  in the last 168 hours  BNP (last 3 results) No results found for this basename: PROBNP,  in the last 8760 hours CBG: No results found for this basename: GLUCAP,  in the last 168 hours  Radiological Exams on Admission: Dg Knee Complete 4 Views Right  05/19/2012  *RADIOLOGY REPORT*  Clinical Data: Knee pain  RIGHT KNEE - COMPLETE 4+ VIEW  Comparison: None.  Findings: Four  views of the right knee submitted.  No acute fracture or subluxation.  Significant narrowing of medial joint compartment.  There is spurring of medial and lateral tibial plateau.  Spurring of femoral condyles.  Significant narrowing of patellofemoral joint space.  Spurring of patella.  Moderate joint effusion.  IMPRESSION: No acute fracture or subluxation.  Significant degenerative changes as described above.  Moderate joint effusion.   Original Report Authenticated By: Natasha Mead, M.D.       Assessment/Plan   1. Right leg weakness, inability to walk. Unclear etiology. Recent repair of abdominal aortic aneurysm. 2. Hypertension. 3. Right knee osteoarthritis with knee effusion. 4. UTI.  Plan: 1. Admit to medical floor. 2. CT angiogram of the abdomen/pelvis to look at the abdominal aortic aneurysm repair to make sure there is no leak. 3. He may need spinal cord imaging if the CT angiogram of the abdomen is negative. 4. Treat UTI with IV Rocephin. Further recommendations will depend on hospital progress.  Code Status: Full code.   Family Communication: Discussed plan with patient and 2 daughters at the bedside.   Disposition Plan: Depending on progress.   Time spent: 45 minutes.  Wilson Singer [facility] Pager [phone]  If 7PM-7AM, please contact night-coverage [email or web address] Password [facility] 05/19/2012, 4:54 PM

## 2012-05-19 NOTE — ED Notes (Signed)
Pt refused to stand during orthostatics

## 2012-05-19 NOTE — ED Provider Notes (Signed)
History     CSN: [id number]  Arrival date & time 05/19/12  1029   First MD Initiated Contact with Patient 05/19/12 1041      Chief Complaint  Patient presents with  . Knee Pain    (Consider location/radiation/quality/duration/timing/severity/associated sxs/prior treatment) HPI Comments: Patient and his daughter c/o generalized weakness since yesterday.  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 Patient and daughter states that he had been progressing well since his return home, ambulating with a cane and appetite was good.  Daughter concerned that he may be developing an infection or "blood clot" due to his sudden lack of energy , generalized weakness and fatigue.  Patient also c/o chronic right knee pain states that he has right knee pain for "A long time" and is going to need a knee replacement. Patient also reports that his wife noticed "blood in his underwear" yesterday after he voided.  He denies dysuria, recent injury, abd pain, chest pain, shortness of breath, fever, chills, or vomiting.    Patient PMD is Dr. Thea Silversmith in [place].  Has f/u appt with his surgeon in [place] next Thursday.    Patient is a 79 y.o. male presenting with knee pain.  Knee Pain Associated symptoms: fatigue   Associated symptoms: no back pain, no fever and no neck pain     Past Medical History  Diagnosis Date  . Hypertension   . BPH (benign prostatic hyperplasia)   . Hyperlipidemia   . AAA (abdominal aortic aneurysm)   . Cancer     prostate    Past Surgical History  Procedure Laterality Date  . Abdominal surgery    . Appendectomy    . Hernia repair    . Bladder surgery  2008  . Total knee arthroplasty  1990    left    No family history on file.  History  Substance Use Topics  . Smoking status: Former Smoker    Types: Cigarettes    Quit date: 03/15/1985  .  Smokeless tobacco: Never Used  . Alcohol Use: No      Review of Systems  Constitutional: Positive for activity change and fatigue. Negative for fever, chills and appetite change.  HENT: Negative for trouble swallowing, neck pain and neck stiffness.   Respiratory: Negative for cough, chest tightness and shortness of breath.   Cardiovascular: Negative for chest pain, palpitations and leg swelling.  Gastrointestinal: Negative for nausea, vomiting, abdominal pain, diarrhea and blood in stool.  Genitourinary: Positive for hematuria. Negative for dysuria, frequency, flank pain and difficulty urinating.  Musculoskeletal: Positive for arthralgias and gait problem. Negative for myalgias, back pain and joint swelling.  Skin: Negative for color change and rash.  Neurological: Positive for weakness. Negative for dizziness, seizures, syncope, facial asymmetry, speech difficulty, light-headedness, numbness and headaches.  Hematological: Negative for adenopathy.  Psychiatric/Behavioral: Negative for confusion and decreased concentration.  All other systems reviewed and are negative.    Allergies  Penicillins  Home Medications   Current Outpatient Rx  Name  Route  Sig  Dispense  Refill  . acetaminophen (TYLENOL) 500 MG tablet   Oral   Take 1,000 mg by mouth every 6 (six) hours as needed. For pain/fever          . aspirin EC 81 MG tablet   Oral   Take 81 mg by mouth daily.         Marland Kitchen  dexbrompheniramine-pseudoephedrine (DRIXORAL) 6-120 MG per tablet   Oral   Take 1 tablet by mouth daily.           Marland Kitchen doxazosin (CARDURA) 8 MG tablet   Oral   Take 8 mg by mouth at bedtime.           . simvastatin (ZOCOR) 80 MG tablet   Oral   Take 80 mg by mouth at bedtime.             BP 130/80  Pulse 93  Temp(Src) 98.6 F (37 C) (Oral)  Resp 20  SpO2 98%  Physical Exam  Vitals reviewed. Constitutional: He is oriented to person, place, and time. He appears well-developed and  well-nourished. No distress.  HENT:  Head: Normocephalic and atraumatic.  Mouth/Throat: Oropharynx is clear and moist.  Neck: Normal range of motion. Neck supple.  Cardiovascular: Normal rate, regular rhythm, normal heart sounds and intact distal pulses.   No murmur heard. Pulmonary/Chest: Effort normal and breath sounds normal. No respiratory distress. He has no wheezes. He exhibits no tenderness.  Abdominal: Soft. Bowel sounds are normal. He exhibits no distension and no mass. There is no tenderness. There is no rebound and no guarding.  Musculoskeletal: He exhibits tenderness.       Right knee: He exhibits decreased range of motion and bony tenderness. He exhibits no swelling, no effusion, no ecchymosis, no deformity, no laceration, no erythema, normal alignment and no LCL laxity. Tenderness found. Medial joint line and lateral joint line tenderness noted.       Legs: Diffuse ttp of the anterior right knee.  Pain reproduced with flexion.  No erythema, edema, effusion.  DP pulses are brisk bilaterally, distal sensation intact, no calf pain, erythema or edema  Neurological: He is alert and oriented to person, place, and time. No cranial nerve deficit or sensory deficit. He exhibits normal muscle tone. Coordination normal.  Reflex Scores:      Patellar reflexes are 2+ on the right side and 2+ on the left side.      Achilles reflexes are 2+ on the right side and 2+ on the left side. Skin: Skin is warm and dry.  Surgical incision to the bilateral groin appears to be healing well.  No edema, erythema, drainage, or excessive warmth.      ED Course  Procedures (including critical care time)  Labs Reviewed  CBC WITH DIFFERENTIAL - Abnormal; Notable for the following:    WBC 13.4 (*)    Neutrophils Relative 86 (*)    Neutro Abs 11.6 (*)    Lymphocytes Relative 6 (*)    Monocytes Absolute 1.1 (*)    All other components within normal limits  BASIC METABOLIC PANEL - Abnormal; Notable for the  following:    Glucose, Bld 134 (*)    GFR calc non Af Amer 66 (*)    GFR calc Af Amer 76 (*)    All other components within normal limits  URINALYSIS, ROUTINE W REFLEX MICROSCOPIC - Abnormal; Notable for the following:    APPearance CLOUDY (*)    Specific Gravity, Urine >1.030 (*)    Hgb urine dipstick MODERATE (*)    Protein, ur 30 (*)    Nitrite POSITIVE (*)    Leukocytes, UA SMALL (*)    All other components within normal limits  URINE MICROSCOPIC-ADD ON - Abnormal; Notable for the following:    Bacteria, UA MANY (*)    All other components within normal limits  URINE  CULTURE  PROTIME-INR   Dg Knee Complete 4 Views Right  05/19/2012  *RADIOLOGY REPORT*  Clinical Data: Knee pain  RIGHT KNEE - COMPLETE 4+ VIEW  Comparison: None.  Findings: Four views of the right knee submitted.  No acute fracture or subluxation.  Significant narrowing of medial joint compartment.  There is spurring of medial and lateral tibial plateau.  Spurring of femoral condyles.  Significant narrowing of patellofemoral joint space.  Spurring of patella.  Moderate joint effusion.  IMPRESSION: No acute fracture or subluxation.  Significant degenerative changes as described above.  Moderate joint effusion.   Original Report Authenticated By: Natasha Mead, M.D.       Urine culture pending  MDM     Pt has generalized weakness w/o focal neuro deficits, vitals stable.  U/A shows UTI, urine culture obtained.  IV Rocephin given in the dept.  S/p surgical repair of aneurysm at [facility] 3 weeks ago, was ambulating well according to family until yesterday.  Family reports sudden change in his normal activities  Patient also seen by EDP and care plan discussed.  Will consult hospitalist for admit  3:09 PM Consulted Dr. Karilyn Cota.  Recommends outpatient treatment for UTI.  1620  Attempted to ambulate patient myself, pt sitting on the bed but unable to stand due to generalized weakness.  No focal neuro deficits.  Consulted   Dr. Karilyn Cota again who will see pt in the ED for admit     Loralye Loberg L. Caz Weaver, PA-C 05/19/12 1708

## 2012-05-20 ENCOUNTER — Ambulatory Visit (HOSPITAL_COMMUNITY)
Admit: 2012-05-20 | Discharge: 2012-05-20 | Disposition: A | Discharge status: Home / Self Care | Payer: Medicare Other | Source: Ambulatory Visit | Attending: Internal Medicine | Admitting: Internal Medicine

## 2012-05-20 DIAGNOSIS — M48061 Spinal stenosis, lumbar region without neurogenic claudication: Secondary | ICD-10-CM | POA: Insufficient documentation

## 2012-05-20 DIAGNOSIS — Z9889 Other specified postprocedural states: Secondary | ICD-10-CM | POA: Insufficient documentation

## 2012-05-20 DIAGNOSIS — R5381 Other malaise: Secondary | ICD-10-CM | POA: Insufficient documentation

## 2012-05-20 DIAGNOSIS — R262 Difficulty in walking, not elsewhere classified: Secondary | ICD-10-CM | POA: Insufficient documentation

## 2012-05-20 LAB — CBC
HCT: 36.9 % — ABNORMAL LOW (ref 39.0–52.0)
MCV: 85.2 fL (ref 78.0–100.0)
Platelets: 175 10*3/uL (ref 150–400)
RBC: 4.33 MIL/uL (ref 4.22–5.81)
RDW: 14.1 % (ref 11.5–15.5)
WBC: 9.3 10*3/uL (ref 4.0–10.5)

## 2012-05-20 LAB — COMPREHENSIVE METABOLIC PANEL
Albumin: 2.4 g/dL — ABNORMAL LOW (ref 3.5–5.2)
Alkaline Phosphatase: 52 U/L (ref 39–117)
BUN: 20 mg/dL (ref 6–23)
Creatinine, Ser: 1.06 mg/dL (ref 0.50–1.35)
GFR calc Af Amer: 74 mL/min — ABNORMAL LOW (ref >90)
Glucose, Bld: 115 mg/dL — ABNORMAL HIGH (ref 70–99)
Potassium: 4 mEq/L (ref 3.5–5.1)
Total Protein: 6.3 g/dL (ref 6.0–8.3)

## 2012-05-20 LAB — PROTIME-INR: INR: 1.24 (ref 0.00–1.49)

## 2012-05-20 MED ORDER — METHYLPREDNISOLONE ACETATE 40 MG/ML IJ SUSP
80.0000 mg | Freq: Once | INTRAMUSCULAR | Status: AC
  Administered 2012-05-20: 80 mg via INTRA_ARTICULAR
  Filled 2012-05-20: qty 2

## 2012-05-20 MED ORDER — ENSURE COMPLETE PO LIQD
237.0000 mL | Freq: Three times a day (TID) | ORAL | Status: DC
  Administered 2012-05-20: 237 mL via ORAL

## 2012-05-20 MED ORDER — GADOBENATE DIMEGLUMINE 529 MG/ML IV SOLN
20.0000 mL | Freq: Once | INTRAVENOUS | Status: AC | PRN
  Administered 2012-05-20: 20 mL via INTRAVENOUS

## 2012-05-20 MED ORDER — OXYCODONE HCL 5 MG PO TABS
5.0000 mg | ORAL_TABLET | ORAL | Status: DC | PRN
  Administered 2012-05-20: 5 mg via ORAL
  Filled 2012-05-20: qty 1

## 2012-05-20 NOTE — Progress Notes (Signed)
Report given to Mat[place]y on 5500.

## 2012-05-20 NOTE — Progress Notes (Signed)
INITIAL NUTRITION ASSESSMENT  DOCUMENTATION CODES Per approved criteria  -Obesity Unspecified   INTERVENTION: Ensure Complete po BID, each supplement provides 350 kcal and 13 grams of protein.  NUTRITION DIAGNOSIS: Inadequate oral food intake related to decreased appetite, weight loss as evidenced by recent sx, progressive wt loss, 5.6% wt loss x 1 month.   Goal: 1) Pt will maintain current weight of 267# 2) Pt will meet >75% of estimated needs  Monitor:  PO intake, labs, weight changes, skin integrity, changes in status  Reason for Assessment: MST=4  79 y.o. male  Admitting Dx: <principal problem not specified>  ASSESSMENT: Pt admitted with lower leg weakness. S/p aortic aneurysm repair at [facility] in 2/14 (about 3 weeks ago).  Chart reviewed. Pt unavailable at time of visit. Chart reveals wt loss since most recent sx. Review of wt hx reports wt loss since at least 8/12. Noted a 16# (5.6%) wt loss x 2 months.  PO intake good; PO: 75%.  Pt for MRI of lumbar spine. Pt to transfer to [facility].   Height: Ht Readings from Last 1 Encounters:  05/20/12 6\' 1"  (1.854 m)    Weight: Wt Readings from Last 1 Encounters:  05/20/12 267 lb 10.2 oz (121.4 kg)    Ideal Body Weight: 184#  % Ideal Body Weight: 145%  Wt Readings from Last 10 Encounters:  05/20/12 267 lb 10.2 oz (121.4 kg)  03/28/12 283 lb 14.4 oz (128.776 kg)  11/09/10 300 lb (136.079 kg)    Usual Body Weight: 300#  % Usual Body Weight: 89%  BMI:  Body mass index is 35.32 kg/(m^2). Classified as obesity, class II.   Estimated Nutritional Needs: Kcal: 1900-2000 daily Protein: 97-121 grams daily Fluid: 1 ml/kcal  Skin: Intact  Diet Order: Cardiac  EDUCATION NEEDS: -Education not appropriate at this time   Intake/Output Summary (Last 24 hours) at 05/20/12 1357 Last data filed at 05/20/12 0700  Gross per 24 hour  Intake   1170 ml  Output    775 ml  Net    395 ml    Last BM: 05/18/12  Labs:   Recent  Labs Lab 05/19/12 1232 05/20/12 0617  NA 135 138  K 4.0 4.0  CL 99 103  CO2 27 27  BUN 20 20  CREATININE 1.04 1.06  CALCIUM 9.7 9.4  GLUCOSE 134* 115*    CBG (last 3)  No results found for this basename: GLUCAP,  in the last 72 hours  Scheduled Meds: . aspirin EC  81 mg Oral Daily  . atorvastatin  40 mg Oral q1800  . cefTRIAXone (ROCEPHIN)  IV  1 g Intravenous Q24H  . doxazosin  8 mg Oral QHS    Continuous Infusions: . sodium chloride 75 mL/hr at 05/20/12 1610    Past Medical History  Diagnosis Date  . Hypertension   . BPH (benign prostatic hyperplasia)   . Hyperlipidemia   . AAA (abdominal aortic aneurysm)   . Cancer     prostate    Past Surgical History  Procedure Laterality Date  . Abdominal surgery    . Appendectomy    . Hernia repair    . Bladder surgery  2008  . Total knee arthroplasty  1990    left  . Abdominal aortic aneurysm repair  04/2012    Melody Haver, RD, LDN Pager: [phone]

## 2012-05-20 NOTE — Progress Notes (Signed)
-   Continues to have right hip weakness, with a recent AAA repair 3 weeks ago. Accepted  transfer [facility] hospital to proceed with an MRI of the lumbar spine to rule out any ischemia. 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 This region is small enough that it may simply represent artifact and does not appear to expand on the delayed images.  - Currently his hemoglobin has only dropped a gram there is no sign of melanotic stools.  -The patient is otherwise stable so it is adhered to the MedSurg unit.

## 2012-05-20 NOTE — Progress Notes (Signed)
NEURO HOSPITALIST CONSULT NOTE    Reason for Consult: Generalized weakness of unclear etiology.  HPI:                                                                                                                                          [name] is an 79 y.o. male history of hypertension, prostate cancer, BPH, hyperlipidemia, and is status post AAA repair 2 weeks ago, who was admitted to [facility] on 05/19/2012 14 onset of generalized weakness. Patient woke up with severe weakness of his extremities yesterday morning. He was doing well on the previous evening and previous day with no weakness. There was no associated sensory loss. He had no change in speech no difficulty with swallowing. He has known degenerative arthritis and had pain involving his right knee. His workup at 8 PM was unremarkable with no indication of infectious process except for possible urinary tract infection. A lumbar MRI study after being transferred here, which was normal. CT angiogram of his abdomen and pelvis was unremarkable. Patient's strength has returned to normal in his upper extremities and in his left lower extremity. It is difficult to assess his right lower extremity because of severe pain involving his right knee in addition to swelling and inflammation. He said no bowel or bladder control abnormalities. He also has not been experiencing back pain.  Past Medical History  Diagnosis Date  . Hypertension   . BPH (benign prostatic hyperplasia)   . Hyperlipidemia   . AAA (abdominal aortic aneurysm)   . Cancer     prostate    Past Surgical History  Procedure Laterality Date  . Abdominal surgery    . Appendectomy    . Hernia repair    . Bladder surgery  2008  . Total knee arthroplasty  1990    left  . Abdominal aortic aneurysm repair  04/2012    Social History:  reports that he quit smoking about 27 years ago. His smoking use included Cigarettes. He smoked 0.00 packs  per day. He has never used smokeless tobacco. He reports that he does not drink alcohol or use illicit drugs.  Allergies  Allergen Reactions  . Penicillins Other (See Comments)    Unknown. Childhood allergy    MEDICATIONS:  I have reviewed the patient's current medications. Scheduled: . aspirin EC  81 mg Oral Daily  . atorvastatin  40 mg Oral q1800  . cefTRIAXone (ROCEPHIN)  IV  1 g Intravenous Q24H  . doxazosin  8 mg Oral QHS  . feeding supplement  237 mL Oral TID WC   RUE:[id number], ondansetron (ZOFRAN) IV, ondansetron, oxyCODONE, traZODone   Blood pressure 127/79, pulse 107, temperature 99 F (37.2 C), temperature source Oral, resp. rate 20, height 6\' 1"  (1.854 m), weight 121.4 kg (267 lb 10.2 oz), SpO2 94.00%.   Neurologic Examination:                                                                                                      Mental Status: Alert, oriented, thought content appropriate.  Speech fluent without evidence of aphasia. Able to follow commands without difficulty. Cranial Nerves: II-Visual fields were normal. III/IV/VI-Pupils were equal and reacted. Extraocular movements were full and conjugate.    V/VII-no facial numbness and no facial weakness. VIII-normal. X-normal speech and symmetrical palatal movement. Motor: 5/5 strength bilaterally the upper extremities proximally and distally as well as normal strength proximally and distally involving left lower extremity. Proximal right lower extremity could not be assessed because of severe pain involving his right knee which appear to be edematous and was very tender and warm compared to his left knee. Strength of his right lower extremity distally was normal. Sensory: Normal throughout. Deep Tendon Reflexes: 2+ and symmetric in the upper extremities; left knee jerk was 2+, right knee jerk not  tested, and both ankle jerks were 2+.. Plantars: Mute bilaterally Cerebellar: Normal finger-to-nose testing. Carotid auscultation: Normal  No results found for this basename: cbc, bmp, coags, chol, tri, ldl, hga1c    Results for orders placed during the hospital encounter of 05/19/12 (from the past 48 hour(s))  URINALYSIS, ROUTINE W REFLEX MICROSCOPIC     Status: Abnormal   Collection Time    05/19/12 12:28 PM      Result Value Range   Color, Urine YELLOW  YELLOW   APPearance CLOUDY (*) CLEAR   Specific Gravity, Urine >1.030 (*) 1.005 - 1.030   pH 6.0  5.0 - 8.0   Glucose, UA NEGATIVE  NEGATIVE mg/dL   Hgb urine dipstick MODERATE (*) NEGATIVE   Bilirubin Urine NEGATIVE  NEGATIVE   Ketones, ur NEGATIVE  NEGATIVE mg/dL   Protein, ur 30 (*) NEGATIVE mg/dL   Urobilinogen, UA 0.2  0.0 - 1.0 mg/dL   Nitrite POSITIVE (*) NEGATIVE   Leukocytes, UA SMALL (*) NEGATIVE  URINE MICROSCOPIC-ADD ON     Status: Abnormal   Collection Time    05/19/12 12:28 PM      Result Value Range   WBC, UA 11-20  <3 WBC/hpf   RBC / HPF 3-6  <3 RBC/hpf   Bacteria, UA MANY (*) RARE  CBC WITH DIFFERENTIAL     Status: Abnormal   Collection Time    05/19/12 12:32 PM      Result Value Range   WBC 13.4 (*) 4.0 - 10.5 K/uL  RBC 4.65  4.22 - 5.81 MIL/uL   Hemoglobin 13.2  13.0 - 17.0 g/dL   HCT 11.9  14.7 - 82.9 %   MCV 85.6  78.0 - 100.0 fL   MCH 28.4  26.0 - 34.0 pg   MCHC 33.2  30.0 - 36.0 g/dL   RDW 56.2  13.0 - 86.5 %   Platelets 187  150 - 400 K/uL   Neutrophils Relative 86 (*) 43 - 77 %   Neutro Abs 11.6 (*) 1.7 - 7.7 K/uL   Lymphocytes Relative 6 (*) 12 - 46 %   Lymphs Abs 0.8  0.7 - 4.0 K/uL   Monocytes Relative 8  3 - 12 %   Monocytes Absolute 1.1 (*) 0.1 - 1.0 K/uL   Eosinophils Relative 0  0 - 5 %   Eosinophils Absolute 0.0  0.0 - 0.7 K/uL   Basophils Relative 0  0 - 1 %   Basophils Absolute 0.0  0.0 - 0.1 K/uL  BASIC METABOLIC PANEL     Status: Abnormal   Collection Time    05/19/12  12:32 PM      Result Value Range   Sodium 135  135 - 145 mEq/L   Potassium 4.0  3.5 - 5.1 mEq/L   Chloride 99  96 - 112 mEq/L   CO2 27  19 - 32 mEq/L   Glucose, Bld 134 (*) 70 - 99 mg/dL   BUN 20  6 - 23 mg/dL   Creatinine, Ser 7.84  0.50 - 1.35 mg/dL   Calcium 9.7  8.4 - 69.6 mg/dL   GFR calc non Af Amer 66 (*) >90 mL/min   GFR calc Af Amer 76 (*) >90 mL/min   Comment:            The eGFR has been calculated     using the CKD EPI equation.     This calculation has not been     validated in all clinical     situations.     eGFR's persistently     <90 mL/min signify     possible Chronic Kidney Disease.  PROTIME-INR     Status: None   Collection Time    05/19/12 12:32 PM      Result Value Range   Prothrombin Time 14.8  11.6 - 15.2 seconds   INR 1.18  0.00 - 1.49  COMPREHENSIVE METABOLIC PANEL     Status: Abnormal   Collection Time    05/20/12  6:17 AM      Result Value Range   Sodium 138  135 - 145 mEq/L   Potassium 4.0  3.5 - 5.1 mEq/L   Chloride 103  96 - 112 mEq/L   CO2 27  19 - 32 mEq/L   Glucose, Bld 115 (*) 70 - 99 mg/dL   BUN 20  6 - 23 mg/dL   Creatinine, Ser 2.95  0.50 - 1.35 mg/dL   Calcium 9.4  8.4 - 28.4 mg/dL   Total Protein 6.3  6.0 - 8.3 g/dL   Albumin 2.4 (*) 3.5 - 5.2 g/dL   AST 15  0 - 37 U/L   ALT 19  0 - 53 U/L   Alkaline Phosphatase 52  39 - 117 U/L   Total Bilirubin 0.5  0.3 - 1.2 mg/dL   GFR calc non Af Amer 64 (*) >90 mL/min   GFR calc Af Amer 74 (*) >90 mL/min   Comment:  The eGFR has been calculated     using the CKD EPI equation.     This calculation has not been     validated in all clinical     situations.     eGFR's persistently     <90 mL/min signify     possible Chronic Kidney Disease.  CBC     Status: Abnormal   Collection Time    05/20/12  6:17 AM      Result Value Range   WBC 9.3  4.0 - 10.5 K/uL   RBC 4.33  4.22 - 5.81 MIL/uL   Hemoglobin 12.2 (*) 13.0 - 17.0 g/dL   HCT 13.0 (*) 86.5 - 78.4 %   MCV 85.2  78.0  - 100.0 fL   MCH 28.2  26.0 - 34.0 pg   MCHC 33.1  30.0 - 36.0 g/dL   RDW 69.6  29.5 - 28.4 %   Platelets 175  150 - 400 K/uL  PROTIME-INR     Status: Abnormal   Collection Time    05/20/12  6:17 AM      Result Value Range   Prothrombin Time 15.4 (*) 11.6 - 15.2 seconds   INR 1.24  0.00 - 1.49    Mr Lumbar Spine W Wo Contrast  05/20/2012  *RADIOLOGY REPORT*  Clinical Data: Inability to walk.  Weakness.  Aortic stent graft 3 weeks ago.  MRI LUMBAR SPINE WITHOUT AND WITH CONTRAST  Technique:  Multiplanar and multiecho pulse sequences of the lumbar spine were obtained without and with intravenous contrast.  Contrast: 20mL MULTIHANCE GADOBENATE DIMEGLUMINE 529 MG/ML IV SOLN  Comparison: CTA abdomen 05/19/2012  Findings: Recent abdominal aortic stentgraft.  For aneurysm.  Upper abdominal aneurysm measures 6 mm.  Infrarenal abdominal   aneurysm has been treated with stent graft.  See separate CT report.  Normal lumbar alignment.  Negative for fracture or mass.  The distal spinal cord and conus medullaris appears normal.  Imaging is performed to the T10 level.  Postcontrast imaging reveals normal enhancement.  L1-2:  Negative  L2-3:  Disc bulging and facet degeneration with mild spinal stenosis  L3-4:  Mild disc bulging.  Moderate facet and ligamentum flavum hypertrophy with moderate spinal stenosis.  L4-5:  Disc degeneration with disc bulging and spurring.  Bilateral facet hypertrophy without significant spinal stenosis.  L5 S1:  Mild disc degeneration.  Advanced facet degeneration. Synovial cyst projects posterior to the left L5 and L1 facet and is not causing neural impingement.  Spinal canal is not narrowed.  IMPRESSION: No evidence of cord infarct or cord compression extending up to the T10 level.  The remainder of the thoracic cord is not evaluated on this study.  Abdominal aortic aneurysm with stent graft, as described in more detail on the recent CT A.  Lumbar degenerative changes with mild spinal  stenosis at L2-3 and moderate spinal stenosis at L3-4.   Original Report Authenticated By: Janeece Riggers, M.D.    Dg Knee Complete 4 Views Right  05/19/2012  *RADIOLOGY REPORT*  Clinical Data: Knee pain  RIGHT KNEE - COMPLETE 4+ VIEW  Comparison: None.  Findings: Four views of the right knee submitted.  No acute fracture or subluxation.  Significant narrowing of medial joint compartment.  There is spurring of medial and lateral tibial plateau.  Spurring of femoral condyles.  Significant narrowing of patellofemoral joint space.  Spurring of patella.  Moderate joint effusion.  IMPRESSION: No acute fracture or subluxation.  Significant degenerative changes as described  above.  Moderate joint effusion.   Original Report Authenticated By: Natasha Mead, M.D.    Ct Angio Abd/pel W/ And/or W/o  05/19/2012  *RADIOLOGY REPORT*  Clinical Data:  AAA repair 3 weeks ago, new right leg weakness evaluate for leak  CT ANGIOGRAPHY ABDOMEN AND PELVIS  Technique:  Multidetector CT imaging of the abdomen and pelvis was performed using the standard protocol during bolus administration of intravenous contrast.  Multiplanar reconstructed images including MIPs were obtained and reviewed to evaluate the vascular anatomy.  Contrast: OMNIPAQUE IOHEXOL 350 MG/ML SOLN  Comparison:  Most recent prior CT abdomen and pelvis 03/02/2012  Findings:  VASCULAR  Aorta: Stable thoracoabdominal aortic aneurysm measures 5.8 x 5.9 cm in greatest transverse dimension (measured in the axial plane just above the celiac artery origin).  There is a fair amount of wall adherent thrombus within the aneurysmal segment.  The patient is status post endovascular aortic repair of a bilobed infrarenal aortic aneurysm. The more cephalad aneurysmal component measures 3.3 x 3.9 cm which is unchanged.  The more inferior aneurysmal component beginning at the bifurcation and predominately involving the right common iliac artery is also unchanged at 4.3 cm in greatest  diameter.  The endograft appears to be a Gore endoprosthesis extending from just below the renal arteries into the bilateral common iliac arteries.  Additionally, there are kissing stent grafts on the right extending into the external and internal iliac arteries.  No evidence of limb occlusion. No definite endoleak on the arterial phase images.  On image 80 of series 4 there is a suggestion of contrast enhancement at the origin of the IMA which could represent a tiny type 2 endoleak. This does not expand on the delayed images.  On the delayed phase, there is no evidence of enhancement of the excluded upper portion of the aneurysm sac.  Unfortunately, the delayed images do not extend to the right common iliac artery aneurysm sac.  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  The artery is widely patent.  Conventional hepatic arterial anatomy.  SMA: Widely patent.  Renals: Single bilateral ring roll arteries are patent.  There is atherosclerotic calcification at the origins bilaterally which results in perhaps mild narrowing.  IMA: The IMA opacifies via retrograde flow.  Inflow: Successful exclusion of the right common iliac artery aneurysm as described above.  The excluded aneurysm sac measures 4.3 cm in greatest diameter which is unchanged compared to prior. No opacification of the aneurysm sac on the arterial phase images to suggest endoleak.  Unfortunately, this area is not included in the field of view on the delayed images. A second bifurcated endograft extends from the right limb of the primary endograft into the internal and external iliac arteries.  Additional limb extenders provide distal seal in the internal and external iliac arteries.  There is mild kinking of the extender stent graft in the external iliac artery which does not appear flow limiting.  The stent grafts are widely patent.  Minimal atherosclerotic disease is noted in the bilateral iliac systems.  No focal stenosis.   Proximal Outflow: Surgical changes of probable bilateral femoral cut downs.  No evidence of pseudoaneurysm.  The common femoral arteries are patent.  Visualized proximal superficial profunda femoral arteries are also patent.  Veins: Given limitations of non venous phase timing, no focal venous abnormality is identified.  NON-VASCULAR  Lower Chest:  Elevation of the left hemidiaphragm with left lower lobe atelectasis.  The lung bases are otherwise clear.  No pleural effusion, or focal consolidation. The visualized heart is within normal limits for size.  No pericardial effusion.  Diffuse atherosclerotic vascular calcifications throughout the coronary arteries.  Lower thoracic esophagus is unremarkable.  Abdomen: Unremarkable appearance of the stomach, spleen, duodenum, adrenal glands and liver. Gallbladder is unremarkable. No intra or extrahepatic biliary ductal dilatation. Fatty atrophy of the pancreas.  No focal lesion.  Stable 3.8 cm cyst exophytic from the upper pole of the left kidney.  Stable 1 cm cyst in the lower pole of the left kidney.  A large 1.5 cm calculus is identified in the lower pole of the left kidney extending into the lower pole infundibula.  No hydronephrosis.  No solid renal mass.  Normal-caliber large small bowel throughout the abdomen.  No evidence of bowel obstruction.  Scattered colonic diverticula without active inflammation.  No free fluid or suspicious adenopathy.  Pelvis: The bladder is collapsed.  Surgical clips identified in the region of the prostate gland.  Normal seminal vesicles.  No free fluid or suspicious adenopathy.  Bones/Soft Tissues: No acute fracture or aggressive appearing lytic or blastic osseous lesion.  Omental fat containing midline ventral abdominal hernia just above the umbilicus appears similar to prior.   Review of the MIP images confirms the above findings.  IMPRESSION:  1.  Fusiform infrarenal abdominal aortic aneurysm with the lower segment predominately  involving the right common iliac artery status post endovascular aortic repair with two bifurcated endografts and extension limbs into the right internal and external iliac arteries without evidence of complication.  The excluded aneurysm sacs remain unchanged in size compared to 03/02/2012. Please note that the right common iliac artery aneurysm sac is not imaged on the delayed series.  There is mild kinking of the extension limb of the right external iliac artery, however this does not appear flow limiting.  2.  Suggestion of a tiny type 2 endoleak at the IMA origin.  This region is small enough that it may simply represent artifact and does not appear to expand on the delayed images.  Recommend attention on routine follow-up imaging.  3. Stable 5.9 cm thoracoabdominal aortic aneurysm with peripheral wall adherent mural thrombus.  4.  Diffuse atherosclerotic vascular disease including coronary artery disease  5. Additional ancillary findings as above without significant interval change.  Signed,  Sterling Big, MD Vascular & Interventional Radiologist [facility]   Original Report Authenticated By: Malachy Moan, M.D.    Assessment/Plan: 1. Transient generalized weakness, resolved. Etiology is unclear but possibly related to urinary tract infection. 2. Patient's mobility at this point is limited because of severe pain and tenderness involving his right knee, which is warm and swollen as well.  Recommendations: 1. No further neurodiagnostic tests are indicated. 2. Recommend physical therapy intervention when patient is able to tolerate right lower extremity movement and weightbearing. 3. Management of acute inflammation involving right knee, and urinary tract infection per primary care team.  Venetia Maxon M.D. [facility] [phone]  05/20/2012, 5:17 PM

## 2012-05-20 NOTE — Progress Notes (Addendum)
Subjective: This man was admitted yesterday with weakness, mostly on the right than the left. I was concerned about leak in his recent repair of AAA. CT angiogram of the abdomen does not seem to indicate any significant leak. There is a suggestion of a tiny type II endoleak at the inferior mesenteric artery origin but the region is so small that it may represent an artifact. Overall, the patient feels somewhat better but his legs remain weak.           Physical Exam: Blood pressure 128/81, pulse 99, temperature 99.7 F (37.6 C), temperature source Oral, resp. rate 18, height 6\' 1"  (1.854 m), weight 121.4 kg (267 lb 10.2 oz), SpO2 91.00%. He is unable to flex his hip against gravity of the right leg at all. The left leg is weak but is able to lift against gravity. Otherwise, there are no new physical findings. He does seem better hydrated.   Investigations:     Basic Metabolic Panel:  Recent Labs  16/10/96 1232 05/20/12 0617  NA 135 138  K 4.0 4.0  CL 99 103  CO2 27 27  GLUCOSE 134* 115*  BUN 20 20  CREATININE 1.04 1.06  CALCIUM 9.7 9.4   Liver Function Tests:  Recent Labs  05/20/12 0617  AST 15  ALT 19  ALKPHOS 52  BILITOT 0.5  PROT 6.3  ALBUMIN 2.4*     CBC:  Recent Labs  05/19/12 1232 05/20/12 0617  WBC 13.4* 9.3  NEUTROABS 11.6*  --   HGB 13.2 12.2*  HCT 39.8 36.9*  MCV 85.6 85.2  PLT 187 175    Dg Knee Complete 4 Views Right  05/19/2012  *RADIOLOGY REPORT*  Clinical Data: Knee pain  RIGHT KNEE - COMPLETE 4+ VIEW  Comparison: None.  Findings: Four views of the right knee submitted.  No acute fracture or subluxation.  Significant narrowing of medial joint compartment.  There is spurring of medial and lateral tibial plateau.  Spurring of femoral condyles.  Significant narrowing of patellofemoral joint space.  Spurring of patella.  Moderate joint effusion.  IMPRESSION: No acute fracture or subluxation.  Significant degenerative changes as  described above.  Moderate joint effusion.   Original Report Authenticated By: Natasha Mead, M.D.    Ct Angio Abd/pel W/ And/or W/o  05/19/2012  *RADIOLOGY REPORT*  Clinical Data:  AAA repair 3 weeks ago, new right leg weakness evaluate for leak  CT ANGIOGRAPHY ABDOMEN AND PELVIS  Technique:  Multidetector CT imaging of the abdomen and pelvis was performed using the standard protocol during bolus administration of intravenous contrast.  Multiplanar reconstructed images including MIPs were obtained and reviewed to evaluate the vascular anatomy.  Contrast: OMNIPAQUE IOHEXOL 350 MG/ML SOLN  Comparison:  Most recent prior CT abdomen and pelvis 03/02/2012  Findings:  VASCULAR  Aorta: Stable thoracoabdominal aortic aneurysm measures 5.8 x 5.9 cm in greatest transverse dimension (measured in the axial plane just above the celiac artery origin).  There is a fair amount of wall adherent thrombus within the aneurysmal segment.  The patient is status post endovascular aortic repair of a bilobed infrarenal aortic aneurysm. The more cephalad aneurysmal component measures 3.3 x 3.9 cm which is unchanged.  The more inferior aneurysmal component beginning at the bifurcation and predominately involving the right common iliac artery is also unchanged at 4.3 cm in greatest diameter.  The endograft appears to be a Gore endoprosthesis extending from just below the renal arteries into the bilateral  common iliac arteries.  Additionally, there are kissing stent grafts on the right extending into the external and internal iliac arteries.  No evidence of limb occlusion. No definite endoleak on the arterial phase images.  On image 80 of series 4 there is a suggestion of contrast enhancement at the origin of the IMA which could represent a tiny type 2 endoleak. This does not expand on the delayed images.  On the delayed phase, there is no evidence of enhancement of the excluded upper portion of the aneurysm sac.  Unfortunately, the  delayed images do not extend to the right common iliac artery aneurysm sac.  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  The artery is widely patent.  Conventional hepatic arterial anatomy.  SMA: Widely patent.  Renals: Single bilateral ring roll arteries are patent.  There is atherosclerotic calcification at the origins bilaterally which results in perhaps mild narrowing.  IMA: The IMA opacifies via retrograde flow.  Inflow: Successful exclusion of the right common iliac artery aneurysm as described above.  The excluded aneurysm sac measures 4.3 cm in greatest diameter which is unchanged compared to prior. No opacification of the aneurysm sac on the arterial phase images to suggest endoleak.  Unfortunately, this area is not included in the field of view on the delayed images. A second bifurcated endograft extends from the right limb of the primary endograft into the internal and external iliac arteries.  Additional limb extenders provide distal seal in the internal and external iliac arteries.  There is mild kinking of the extender stent graft in the external iliac artery which does not appear flow limiting.  The stent grafts are widely patent.  Minimal atherosclerotic disease is noted in the bilateral iliac systems.  No focal stenosis.  Proximal Outflow: Surgical changes of probable bilateral femoral cut downs.  No evidence of pseudoaneurysm.  The common femoral arteries are patent.  Visualized proximal superficial profunda femoral arteries are also patent.  Veins: Given limitations of non venous phase timing, no focal venous abnormality is identified.  NON-VASCULAR  Lower Chest:  Elevation of the left hemidiaphragm with left lower lobe atelectasis.  The lung bases are otherwise clear.  No pleural effusion, or focal consolidation. The visualized heart is within normal limits for size.  No pericardial effusion.  Diffuse atherosclerotic vascular calcifications throughout the coronary  arteries.  Lower thoracic esophagus is unremarkable.  Abdomen: Unremarkable appearance of the stomach, spleen, duodenum, adrenal glands and liver. Gallbladder is unremarkable. No intra or extrahepatic biliary ductal dilatation. Fatty atrophy of the pancreas.  No focal lesion.  Stable 3.8 cm cyst exophytic from the upper pole of the left kidney.  Stable 1 cm cyst in the lower pole of the left kidney.  A large 1.5 cm calculus is identified in the lower pole of the left kidney extending into the lower pole infundibula.  No hydronephrosis.  No solid renal mass.  Normal-caliber large small bowel throughout the abdomen.  No evidence of bowel obstruction.  Scattered colonic diverticula without active inflammation.  No free fluid or suspicious adenopathy.  Pelvis: The bladder is collapsed.  Surgical clips identified in the region of the prostate gland.  Normal seminal vesicles.  No free fluid or suspicious adenopathy.  Bones/Soft Tissues: No acute fracture or aggressive appearing lytic or blastic osseous lesion.  Omental fat containing midline ventral abdominal hernia just above the umbilicus appears similar to prior.   Review of the MIP images confirms the above findings.  IMPRESSION:  1.  Fusiform infrarenal abdominal aortic aneurysm with the lower segment predominately involving the right common iliac artery status post endovascular aortic repair with two bifurcated endografts and extension limbs into the right internal and external iliac arteries without evidence of complication.  The excluded aneurysm sacs remain unchanged in size compared to 03/02/2012. Please note that the right common iliac artery aneurysm sac is not imaged on the delayed series.  There is mild kinking of the extension limb of the right external iliac artery, however this does not appear flow limiting.  2.  Suggestion of a tiny type 2 endoleak at the IMA origin.  This region is small enough that it may simply represent artifact and does not appear  to expand on the delayed images.  Recommend attention on routine follow-up imaging.  3. Stable 5.9 cm thoracoabdominal aortic aneurysm with peripheral wall adherent mural thrombus.  4.  Diffuse atherosclerotic vascular disease including coronary artery disease  5. Additional ancillary findings as above without significant interval change.  Signed,  Sterling Big, MD Vascular & Interventional Radiologist [facility]   Original Report Authenticated By: Malachy Moan, M.D.       Medications: I have reviewed the patient's current medications.  Impression: 1. Weakness of the right leg greater than the left leg. Unclear etiology. 2. Recent repair of AAA. 3. Hypertension. 4. Obesity.     Plan: 1. MRI lumbar spine today to make sure there is no spinal cord problem causing his symptoms. 2. Continue with therapy otherwise. Physical therapy evaluation as he may need skilled nursing facility placement, regardless of his pathology.     LOS: 1 day   Wilson Singer Pager [phone]  05/20/2012, 9:37 AM Addendum: The family are requesting transfer of this patient to [facility] hospital because patient's specialists are there. I've spoken with my colleague Dr. David Stall, who has kindly accepted the patient in transfer.

## 2012-05-20 NOTE — Progress Notes (Signed)
NURSING PROGRESS NOTE  ALEKSEI GOODLIN [id number] Transfer Data: 05/20/2012 4:16 PM Attending Provider: Marinda Elk, MD WUJ:[id number],NFAOZ, MD Code Status: full  [name] is a 79 y.o. male patient transferred from [facility] No acute distress noted.  No c/o shortness of breath, no c/o chest pain.   Blood pressure 127/79, pulse 107, temperature 99 F (37.2 C), temperature source Oral, resp. rate 20, height 6\' 1"  (1.854 m), weight 121.4 kg (267 lb 10.2 oz), SpO2 94.00%.   IV Fluids:  IV in place, occlusive dsg intact without redness, IV cath wrist right, condition patent and no redness normal saline.   Allergies:  Penicillins  Past Medical History:   has a past medical history of Hypertension; BPH (benign prostatic hyperplasia); Hyperlipidemia; AAA (abdominal aortic aneurysm); and Cancer.  Past Surgical History:   has past surgical history that includes Abdominal surgery; Appendectomy; Hernia repair; Bladder surgery (2008); Total knee arthroplasty (1990); and Abdominal aortic aneurysm repair (04/2012).  Social History:   reports that he quit smoking about 27 years ago. His smoking use included Cigarettes. He smoked 0.00 packs per day. He has never used smokeless tobacco. He reports that he does not drink alcohol or use illicit drugs.  Skin: dry and intact  Orientation to room, and floor completed with information packet given to patient/family.   SR up x 2, fall assessment complete, with patient and family able to verbalize understanding of risk associated with falls, and verbalized understanding to call for assistance before getting out of bed.   Call light within reach. Patient able to voice and demonstrate understanding of unit orientation instructions.   Will cont to eval and treat per MD orders.  Rosalie Doctor, RN

## 2012-05-20 NOTE — H&P (Signed)
PCP:   Thayer Headings, MD   Chief Complaint:  Generalized weakness  HPI: 79 yr old male who was transferred from the [facility] hospital for for further work up of lower extremity weakness, MRI of the lumbar spine showed No evidence of cord infarct or cord compression extending up to the  T10 level.  Patient says he is now better, but still unable to walk. CTA of the abdomen pelvis shows suggestion of tiny endoleak at the IMA origin. This  region is small enough that it may simply represent artifact and  does not appear to expand on the delayed images. Recommend  attention on routine follow-up imaging. Patient had abnormal UA and was started on rocephin. Urine culture is pending. Allergies:   Allergies  Allergen Reactions  . Penicillins Other (See Comments)    Unknown. Childhood allergy      Past Medical History  Diagnosis Date  . Hypertension   . BPH (benign prostatic hyperplasia)   . Hyperlipidemia   . AAA (abdominal aortic aneurysm)   . Cancer     prostate    Past Surgical History  Procedure Laterality Date  . Abdominal surgery    . Appendectomy    . Hernia repair    . Bladder surgery  2008  . Total knee arthroplasty  1990    left  . Abdominal aortic aneurysm repair  04/2012    Prior to Admission medications   Medication Sig Start Date End Date Taking? Authorizing Vielka Klinedinst  acetaminophen (TYLENOL) 500 MG tablet Take 1,000 mg by mouth every 6 (six) hours as needed. For pain/fever    Yes Historical Selby Slovacek, MD  aspirin EC 81 MG tablet Take 81 mg by mouth daily.   Yes Historical Ozan Maclay, MD  dexbrompheniramine-pseudoephedrine (DRIXORAL) 6-120 MG per tablet Take 1 tablet by mouth daily.     Yes Historical Trinadee Verhagen, MD  doxazosin (CARDURA) 8 MG tablet Take 8 mg by mouth at bedtime.     Yes Historical Jahayra Mazo, MD  simvastatin (ZOCOR) 80 MG tablet Take 80 mg by mouth at bedtime.     Yes Historical Destenie Ingber, MD    Social History:  reports that he quit smoking  about 27 years ago. His smoking use included Cigarettes. He smoked 0.00 packs per day. He has never used smokeless tobacco. He reports that he does not drink alcohol or use illicit drugs.  No family history on file.  Review of Systems:  HEENT: Denies headache, blurred vision, runny nose, sore throat,  Neck: Denies thyroid problems,lymphadenopathy Chest : Denies shortness of breath, no history of COPD Heart : Denies Chest pain,  coronary arterey disease GI: Denies  nausea, vomiting, diarrhea, constipation GU: Denies dysuria, urgency, frequency of urination, hematuria Neuro: Denies stroke,    Physical Exam: Blood pressure 127/79, pulse 107, temperature 99 F (37.2 C), temperature source Oral, resp. rate 20, height 6\' 1"  (1.854 m), weight 121.4 kg (267 lb 10.2 oz), SpO2 94.00%. Constitutional:   Patient is a well-developed and well-nourished male * in no acute distress and cooperative with exam. Head: Normocephalic and atraumatic Mouth: Mucus membranes moist Eyes: PERRL, EOMI, conjunctivae normal Neck: Supple, No Thyromegaly Cardiovascular: RRR, S1 normal, S2 normal Pulmonary/Chest: CTAB, no wheezes, rales, or rhonchi Abdominal: Soft. Non-tender, non-distended, bowel sounds are normal, no masses, organomegaly, or guarding present.  Neurological: A&O x3, Strenght is normal and symmetric bilaterally, cranial nerve II-XII are grossly intact, no focal motor deficit, sensory intact to light touch bilaterally. has generalized weakness Extremities : Right knee  warm to touch, no erythema   Labs on Admission:  Results for orders placed during the hospital encounter of 05/19/12 (from the past 48 hour(s))  URINALYSIS, ROUTINE W REFLEX MICROSCOPIC     Status: Abnormal   Collection Time    05/19/12 12:28 PM      Result Value Range   Color, Urine YELLOW  YELLOW   APPearance CLOUDY (*) CLEAR   Specific Gravity, Urine >1.030 (*) 1.005 - 1.030   pH 6.0  5.0 - 8.0   Glucose, UA NEGATIVE  NEGATIVE  mg/dL   Hgb urine dipstick MODERATE (*) NEGATIVE   Bilirubin Urine NEGATIVE  NEGATIVE   Ketones, ur NEGATIVE  NEGATIVE mg/dL   Protein, ur 30 (*) NEGATIVE mg/dL   Urobilinogen, UA 0.2  0.0 - 1.0 mg/dL   Nitrite POSITIVE (*) NEGATIVE   Leukocytes, UA SMALL (*) NEGATIVE  URINE MICROSCOPIC-ADD ON     Status: Abnormal   Collection Time    05/19/12 12:28 PM      Result Value Range   WBC, UA 11-20  <3 WBC/hpf   RBC / HPF 3-6  <3 RBC/hpf   Bacteria, UA MANY (*) RARE  CBC WITH DIFFERENTIAL     Status: Abnormal   Collection Time    05/19/12 12:32 PM      Result Value Range   WBC 13.4 (*) 4.0 - 10.5 K/uL   RBC 4.65  4.22 - 5.81 MIL/uL   Hemoglobin 13.2  13.0 - 17.0 g/dL   HCT 16.1  09.6 - 04.5 %   MCV 85.6  78.0 - 100.0 fL   MCH 28.4  26.0 - 34.0 pg   MCHC 33.2  30.0 - 36.0 g/dL   RDW 40.9  81.1 - 91.4 %   Platelets 187  150 - 400 K/uL   Neutrophils Relative 86 (*) 43 - 77 %   Neutro Abs 11.6 (*) 1.7 - 7.7 K/uL   Lymphocytes Relative 6 (*) 12 - 46 %   Lymphs Abs 0.8  0.7 - 4.0 K/uL   Monocytes Relative 8  3 - 12 %   Monocytes Absolute 1.1 (*) 0.1 - 1.0 K/uL   Eosinophils Relative 0  0 - 5 %   Eosinophils Absolute 0.0  0.0 - 0.7 K/uL   Basophils Relative 0  0 - 1 %   Basophils Absolute 0.0  0.0 - 0.1 K/uL  BASIC METABOLIC PANEL     Status: Abnormal   Collection Time    05/19/12 12:32 PM      Result Value Range   Sodium 135  135 - 145 mEq/L   Potassium 4.0  3.5 - 5.1 mEq/L   Chloride 99  96 - 112 mEq/L   CO2 27  19 - 32 mEq/L   Glucose, Bld 134 (*) 70 - 99 mg/dL   BUN 20  6 - 23 mg/dL   Creatinine, Ser 7.82  0.50 - 1.35 mg/dL   Calcium 9.7  8.4 - 95.6 mg/dL   GFR calc non Af Amer 66 (*) >90 mL/min   GFR calc Af Amer 76 (*) >90 mL/min   Comment:            The eGFR has been calculated     using the CKD EPI equation.     This calculation has not been     validated in all clinical     situations.     eGFR's persistently     <90 mL/min signify  possible Chronic Kidney  Disease.  PROTIME-INR     Status: None   Collection Time    05/19/12 12:32 PM      Result Value Range   Prothrombin Time 14.8  11.6 - 15.2 seconds   INR 1.18  0.00 - 1.49  COMPREHENSIVE METABOLIC PANEL     Status: Abnormal   Collection Time    05/20/12  6:17 AM      Result Value Range   Sodium 138  135 - 145 mEq/L   Potassium 4.0  3.5 - 5.1 mEq/L   Chloride 103  96 - 112 mEq/L   CO2 27  19 - 32 mEq/L   Glucose, Bld 115 (*) 70 - 99 mg/dL   BUN 20  6 - 23 mg/dL   Creatinine, Ser 1.61  0.50 - 1.35 mg/dL   Calcium 9.4  8.4 - 09.6 mg/dL   Total Protein 6.3  6.0 - 8.3 g/dL   Albumin 2.4 (*) 3.5 - 5.2 g/dL   AST 15  0 - 37 U/L   ALT 19  0 - 53 U/L   Alkaline Phosphatase 52  39 - 117 U/L   Total Bilirubin 0.5  0.3 - 1.2 mg/dL   GFR calc non Af Amer 64 (*) >90 mL/min   GFR calc Af Amer 74 (*) >90 mL/min   Comment:            The eGFR has been calculated     using the CKD EPI equation.     This calculation has not been     validated in all clinical     situations.     eGFR's persistently     <90 mL/min signify     possible Chronic Kidney Disease.  CBC     Status: Abnormal   Collection Time    05/20/12  6:17 AM      Result Value Range   WBC 9.3  4.0 - 10.5 K/uL   RBC 4.33  4.22 - 5.81 MIL/uL   Hemoglobin 12.2 (*) 13.0 - 17.0 g/dL   HCT 04.5 (*) 40.9 - 81.1 %   MCV 85.2  78.0 - 100.0 fL   MCH 28.2  26.0 - 34.0 pg   MCHC 33.1  30.0 - 36.0 g/dL   RDW 91.4  78.2 - 95.6 %   Platelets 175  150 - 400 K/uL  PROTIME-INR     Status: Abnormal   Collection Time    05/20/12  6:17 AM      Result Value Range   Prothrombin Time 15.4 (*) 11.6 - 15.2 seconds   INR 1.24  0.00 - 1.49    Radiological Exams on Admission: Mr Lumbar Spine W Wo Contrast  05/20/2012  *RADIOLOGY REPORT*  Clinical Data: Inability to walk.  Weakness.  Aortic stent graft 3 weeks ago.  MRI LUMBAR SPINE WITHOUT AND WITH CONTRAST  Technique:  Multiplanar and multiecho pulse sequences of the lumbar spine were obtained  without and with intravenous contrast.  Contrast: 20mL MULTIHANCE GADOBENATE DIMEGLUMINE 529 MG/ML IV SOLN  Comparison: CTA abdomen 05/19/2012  Findings: Recent abdominal aortic stentgraft.  For aneurysm.  Upper abdominal aneurysm measures 6 mm.  Infrarenal abdominal   aneurysm has been treated with stent graft.  See separate CT report.  Normal lumbar alignment.  Negative for fracture or mass.  The distal spinal cord and conus medullaris appears normal.  Imaging is performed to the T10 level.  Postcontrast imaging reveals normal enhancement.  L1-2:  Negative  L2-3:  Disc bulging and facet degeneration with mild spinal stenosis  L3-4:  Mild disc bulging.  Moderate facet and ligamentum flavum hypertrophy with moderate spinal stenosis.  L4-5:  Disc degeneration with disc bulging and spurring.  Bilateral facet hypertrophy without significant spinal stenosis.  L5 S1:  Mild disc degeneration.  Advanced facet degeneration. Synovial cyst projects posterior to the left L5 and L1 facet and is not causing neural impingement.  Spinal canal is not narrowed.  IMPRESSION: No evidence of cord infarct or cord compression extending up to the T10 level.  The remainder of the thoracic cord is not evaluated on this study.  Abdominal aortic aneurysm with stent graft, as described in more detail on the recent CT A.  Lumbar degenerative changes with mild spinal stenosis at L2-3 and moderate spinal stenosis at L3-4.   Original Report Authenticated By: Janeece Riggers, M.D.    Dg Knee Complete 4 Views Right  05/19/2012  *RADIOLOGY REPORT*  Clinical Data: Knee pain  RIGHT KNEE - COMPLETE 4+ VIEW  Comparison: None.  Findings: Four views of the right knee submitted.  No acute fracture or subluxation.  Significant narrowing of medial joint compartment.  There is spurring of medial and lateral tibial plateau.  Spurring of femoral condyles.  Significant narrowing of patellofemoral joint space.  Spurring of patella.  Moderate joint effusion.   IMPRESSION: No acute fracture or subluxation.  Significant degenerative changes as described above.  Moderate joint effusion.   Original Report Authenticated By: Natasha Mead, M.D.    Ct Angio Abd/pel W/ And/or W/o  05/19/2012  *RADIOLOGY REPORT*  Clinical Data:  AAA repair 3 weeks ago, new right leg weakness evaluate for leak  CT ANGIOGRAPHY ABDOMEN AND PELVIS  Technique:  Multidetector CT imaging of the abdomen and pelvis was performed using the standard protocol during bolus administration of intravenous contrast.  Multiplanar reconstructed images including MIPs were obtained and reviewed to evaluate the vascular anatomy.  Contrast: OMNIPAQUE IOHEXOL 350 MG/ML SOLN  Comparison:  Most recent prior CT abdomen and pelvis 03/02/2012  Findings:  VASCULAR  Aorta: Stable thoracoabdominal aortic aneurysm measures 5.8 x 5.9 cm in greatest transverse dimension (measured in the axial plane just above the celiac artery origin).  There is a fair amount of wall adherent thrombus within the aneurysmal segment.  The patient is status post endovascular aortic repair of a bilobed infrarenal aortic aneurysm. The more cephalad aneurysmal component measures 3.3 x 3.9 cm which is unchanged.  The more inferior aneurysmal component beginning at the bifurcation and predominately involving the right common iliac artery is also unchanged at 4.3 cm in greatest diameter.  The endograft appears to be a Gore endoprosthesis extending from just below the renal arteries into the bilateral common iliac arteries.  Additionally, there are kissing stent grafts on the right extending into the external and internal iliac arteries.  No evidence of limb occlusion. No definite endoleak on the arterial phase images.  On image 80 of series 4 there is a suggestion of contrast enhancement at the origin of the IMA which could represent a tiny type 2 endoleak. This does not expand on the delayed images.  On the delayed phase, there is no evidence of  enhancement of the excluded upper portion of the aneurysm sac.  Unfortunately, the delayed images do not extend to the right common iliac artery aneurysm sac.  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  The artery is widely patent.  Conventional hepatic  arterial anatomy.  SMA: Widely patent.  Renals: Single bilateral ring roll arteries are patent.  There is atherosclerotic calcification at the origins bilaterally which results in perhaps mild narrowing.  IMA: The IMA opacifies via retrograde flow.  Inflow: Successful exclusion of the right common iliac artery aneurysm as described above.  The excluded aneurysm sac measures 4.3 cm in greatest diameter which is unchanged compared to prior. No opacification of the aneurysm sac on the arterial phase images to suggest endoleak.  Unfortunately, this area is not included in the field of view on the delayed images. A second bifurcated endograft extends from the right limb of the primary endograft into the internal and external iliac arteries.  Additional limb extenders provide distal seal in the internal and external iliac arteries.  There is mild kinking of the extender stent graft in the external iliac artery which does not appear flow limiting.  The stent grafts are widely patent.  Minimal atherosclerotic disease is noted in the bilateral iliac systems.  No focal stenosis.  Proximal Outflow: Surgical changes of probable bilateral femoral cut downs.  No evidence of pseudoaneurysm.  The common femoral arteries are patent.  Visualized proximal superficial profunda femoral arteries are also patent.  Veins: Given limitations of non venous phase timing, no focal venous abnormality is identified.  NON-VASCULAR  Lower Chest:  Elevation of the left hemidiaphragm with left lower lobe atelectasis.  The lung bases are otherwise clear.  No pleural effusion, or focal consolidation. The visualized heart is within normal limits for size.  No pericardial  effusion.  Diffuse atherosclerotic vascular calcifications throughout the coronary arteries.  Lower thoracic esophagus is unremarkable.  Abdomen: Unremarkable appearance of the stomach, spleen, duodenum, adrenal glands and liver. Gallbladder is unremarkable. No intra or extrahepatic biliary ductal dilatation. Fatty atrophy of the pancreas.  No focal lesion.  Stable 3.8 cm cyst exophytic from the upper pole of the left kidney.  Stable 1 cm cyst in the lower pole of the left kidney.  A large 1.5 cm calculus is identified in the lower pole of the left kidney extending into the lower pole infundibula.  No hydronephrosis.  No solid renal mass.  Normal-caliber large small bowel throughout the abdomen.  No evidence of bowel obstruction.  Scattered colonic diverticula without active inflammation.  No free fluid or suspicious adenopathy.  Pelvis: The bladder is collapsed.  Surgical clips identified in the region of the prostate gland.  Normal seminal vesicles.  No free fluid or suspicious adenopathy.  Bones/Soft Tissues: No acute fracture or aggressive appearing lytic or blastic osseous lesion.  Omental fat containing midline ventral abdominal hernia just above the umbilicus appears similar to prior.   Review of the MIP images confirms the above findings.  IMPRESSION:  1.  Fusiform infrarenal abdominal aortic aneurysm with the lower segment predominately involving the right common iliac artery status post endovascular aortic repair with two bifurcated endografts and extension limbs into the right internal and external iliac arteries without evidence of complication.  The excluded aneurysm sacs remain unchanged in size compared to 03/02/2012. Please note that the right common iliac artery aneurysm sac is not imaged on the delayed series.  There is mild kinking of the extension limb of the right external iliac artery, however this does not appear flow limiting.  2.  Suggestion of a tiny type 2 endoleak at the IMA origin.  This  region is small enough that it may simply represent artifact and does not appear to expand on the  delayed images.  Recommend attention on routine follow-up imaging.  3. Stable 5.9 cm thoracoabdominal aortic aneurysm with peripheral wall adherent mural thrombus.  4.  Diffuse atherosclerotic vascular disease including coronary artery disease  5. Additional ancillary findings as above without significant interval change.  Signed,  Sterling Big, MD Vascular & Interventional Radiologist [facility]   Original Report Authenticated By: Malachy Moan, M.D.     Assessment/Plan Active Problems:   Abdominal aneurysm without mention of rupture   Weakness of right leg   HTN (hypertension)  S/p AAA repair CTA shows type 2 endoleak at the IMA. Called and discussed with Dr Darrick Penna who has reviewed the films and does not think it is causing lower ext weakness. He also does not recommend any intervention at this time  Generalized weakness ? neurological cause Will get neuro consult  UTI Continue IV rocephin Urine culture is pending  OA Patient has moderate joint effusion in rt knee Continue oxycodone prn for pain Ortho consulted  DVT prophylaxis SCD  Time Spent on Admission:   [facility] Pager: [phone] 05/20/2012, 4:43 PM

## 2012-05-20 NOTE — Progress Notes (Signed)
Patient transferred to [facility] by [facility].

## 2012-05-21 LAB — BASIC METABOLIC PANEL
BUN: 18 mg/dL (ref 6–23)
CO2: 25 mEq/L (ref 19–32)
GFR calc non Af Amer: 69 mL/min — ABNORMAL LOW (ref >90)
Glucose, Bld: 126 mg/dL — ABNORMAL HIGH (ref 70–99)
Potassium: 4.4 mEq/L (ref 3.5–5.1)
Sodium: 136 mEq/L (ref 135–145)

## 2012-05-21 MED ORDER — CIPROFLOXACIN HCL 250 MG PO TABS: 250.0000 mg | ORAL_TABLET | Freq: Two times a day (BID) | ORAL | Status: DC

## 2012-05-21 MED ORDER — OXYCODONE HCL 5 MG PO TABS: 5.0000 mg | ORAL_TABLET | ORAL | Status: DC | PRN

## 2012-05-21 NOTE — Consult Note (Signed)
I was asked to review pt CT scan as possible cause of his leg weakness. The patient was discharged to home prior to me being able to obtain history or physical exam.    His CT was read as type II endoleak possibly from IMA.  On my review of images this is trivial if present at all.  Pt has infrarenal stent graft with branch to right internal iliac and to distal left common iliac.  No evidence of type one leak.  No evidence of stent migration or aneurysm rupture.  Type II endoleaks occur relatively frequently and have an overall benign course.  The patient should be encouraged to continue follow up surveillance of his stent graft with his surgeon at [facility].  Fabienne Bruns, MD [facility] [place] Office: [phone] Pager: [phone]

## 2012-05-21 NOTE — Evaluation (Signed)
Physical Therapy Evaluation Patient Details Name: [name] MRN: [id number] DOB: 1929/12/30 Today's Date: 05/21/2012 Time: 1478-2956 PT Time Calculation (min): 34 min  PT Assessment / Plan / Recommendation Clinical Impression  79 yo adm with transient bil LE weakness, UTI, and Rt knee effusion. Pt will benefit from PT to address the deficits below and to educate in safe use of DME and progress independence prior to d/c home with wife.    PT Assessment  Patient needs continued PT services    Follow Up Recommendations  Home health PT;Supervision for mobility/OOB [facility] for safety eval)    Does the patient have the potential to tolerate intense rehabilitation      Barriers to Discharge None      Equipment Recommendations  Rolling walker with 5" wheels    Recommendations for Other Services OT consult   Frequency Min 4X/week    Precautions / Restrictions Precautions Precautions: Fall   Pertinent Vitals/Pain 5/10 Rt knee; instructed in ROM exercises; repositioned       Mobility  Bed Mobility Bed Mobility: Rolling Right;Right Sidelying to Sit;Sitting - Scoot to Edge of Bed Rolling Right: 5: Supervision;With rail Right Sidelying to Sit: 4: Min assist;With rails;HOB elevated Sitting - Scoot to Edge of Bed: 4: Min guard Details for Bed Mobility Assistance: pt requesting assist due to feeling weak; encouraged to do as much for himself as possible Transfers Transfers: Sit to Stand;Stand to Sit Sit to Stand: 4: Min assist;From elevated surface;From bed Stand to Sit: 4: Min assist;With upper extremity assist;With armrests;To chair/3-in-1 Details for Transfer Assistance: vc for safe use of RW; assist to shift forward over BOS Ambulation/Gait Ambulation/Gait Assistance: 4: Min guard Ambulation Distance (Feet): 10 Feet Assistive device: Rolling walker Ambulation/Gait Assistance Details: vc for sequencing for RLE pain Gait Pattern: Step-to pattern    Exercises General Exercises  - Lower Extremity Ankle Circles/Pumps: AROM;Both;5 reps;Supine Heel Slides: AROM;Right;10 reps;Seated (foot on floor, knee flexion/ext in comfortable ROM) Straight Leg Raises: AROM;Right;Supine   PT Diagnosis: Difficulty walking;Acute pain  PT Problem List: Decreased strength;Decreased range of motion;Decreased activity tolerance;Decreased mobility;Decreased knowledge of use of DME;Pain PT Treatment Interventions: DME instruction;Gait training;Stair training;Functional mobility training;Therapeutic activities;Therapeutic exercise;Patient/family education   PT Goals Acute Rehab PT Goals PT Goal Formulation: With patient Time For Goal Achievement: 05/26/12 Potential to Achieve Goals: Good Pt will go Supine/Side to Sit: with supervision;with HOB 0 degrees PT Goal: Supine/Side to Sit - Progress: Goal set today Pt will go Sit to Supine/Side: with supervision;with HOB 0 degrees PT Goal: Sit to Supine/Side - Progress: Goal set today Pt will go Sit to Stand: with supervision PT Goal: Sit to Stand - Progress: Goal set today Pt will go Stand to Sit: with supervision PT Goal: Stand to Sit - Progress: Goal set today Pt will Ambulate: 51 - 150 feet;with supervision;with least restrictive assistive device PT Goal: Ambulate - Progress: Goal set today Pt will Go Up / Down Stairs: 1-2 stairs;with min assist PT Goal: Up/Down Stairs - Progress: Goal set today  Visit Information  Last PT Received On: 05/21/12 Assistance Needed: +1    Subjective Data  Subjective: Reports knee has bothered him for about 1 month Patient Stated Goal: decr knee pain; get back to baseline   Prior Functioning  Home Living Lives With: Spouse Available Help at Discharge: Family;Available 24 hours/day Type of Home: House Home Access: Stairs to enter [employer] of Steps: 1 Entrance Stairs-Rails: None Home Layout: One level Bathroom Shower/Tub: Tub/shower unit;Door [employer]: Standard  Bathroom  Accessibility: Yes How Accessible: Accessible via walker Home Adaptive Equipment: Bedside commode/3-in-1;Crutches;Shower chair without back;Straight cane;Walker - standard Prior Function Level of Independence: Independent with assistive device(s) Able to Take Stairs?: Yes Driving: Yes Vocation: Retired Comments: began using cane ~1 month ago due to Rt knee pain Communication Communication: HOH    Cognition  Cognition Overall Cognitive Status: Appears within functional limits for tasks assessed/performed Arousal/Alertness: Awake/alert Orientation Level: Oriented X4 / Intact Behavior During Session: WFL for tasks performed    Extremity/Trunk Assessment Right Upper Extremity Assessment RUE ROM/Strength/Tone: [facility] for tasks assessed Left Upper Extremity Assessment LUE ROM/Strength/Tone: WFL for tasks assessed Right Lower Extremity Assessment RLE ROM/Strength/Tone: Deficits;Due to pain RLE ROM/Strength/Tone Deficits: AROM hip WFL, knee flexion to 100, ankle WFL; dorsiflexion 4/5, hip flexion 3+ RLE Sensation: WFL - Light Touch RLE Coordination: WFL - gross motor Left Lower Extremity Assessment LLE ROM/Strength/Tone: WFL for tasks assessed LLE Sensation: WFL - Light Touch LLE Coordination: WFL - gross motor Trunk Assessment Trunk Assessment: Normal   Balance    End of Session PT - End of Session Equipment Utilized During Treatment: Gait belt Activity Tolerance: Patient tolerated treatment well Patient left: in chair;with call bell/phone within reach;with family/visitor present Nurse Communication: Mobility status  GP     SASSER,LYNN 05/21/2012, 10:41 AM Pager [phone]

## 2012-05-21 NOTE — Progress Notes (Signed)
   CARE MANAGEMENT NOTE 05/21/2012  Patient:  [name], [name]   Account Number:  [id number]  Date Initiated:  05/21/2012  Documentation initiated by:  [facility]  Subjective/Objective Assessment:     Action/Plan:   Anticipated DC Date:  05/21/2012   Anticipated DC Plan:  HOME W HOME HEALTH SERVICES      DC Planning Services  CM consult      [facility] Choice  HOME HEALTH   Choice offered to / List presented to:  C-4 Adult Children   DME arranged  WALKER - [name]      DME agency  [facility]     HH arranged  HH-2 PT  HH-4 NURSE'S AIDE      HH agency  [facility]   Status of service:  Completed, signed off Medicare Important Message given?   (If response is "NO", the following Medicare IM given date fields will be blank) Date Medicare IM given:   Date Additional Medicare IM given:    Discharge Disposition:  HOME W HOME HEALTH SERVICES  Per UR Regulation:    If discussed at Long Length of Stay Meetings, dates discussed:    Comments:  05/21/2012 1300 NCM spoke to dtr, [name] # [phone]. Offered choice for [facility]. States she wants [facility] for [facility]. Received RW for [facility] DME rep. Isidoro Donning RN CCM Case Mgmt phone [phone]

## 2012-05-21 NOTE — Discharge Summary (Signed)
Physician Discharge Summary  [name] YNW:[id number] DOB: 1929/07/18 DOA: 05/19/2012  PCP: Thayer Headings, MD  Admit date: 05/19/2012 Discharge date: 05/21/2012  Time spent: > 35 minutes  Recommendations for Outpatient Follow-up:  1) Please be sure to follow up with your orthopaedic surgeon for further evaluation and recommendations.  Discharge Diagnoses:  Active Problems:   Abdominal aneurysm without mention of rupture   Weakness of right leg   HTN (hypertension)   Discharge Condition: Stable  Diet recommendation: heart healthy  Filed Weights   05/20/12 0044  Weight: 121.4 kg (267 lb 10.2 oz)    History of present illness:  79 y/o CM with h/o BPH, HPL, AAA, transferred from [facility] for evaluation and lower extremity weakness.    Hospital Course:  S/p AAA repair  Caser reportedly discussed with Dr Darrick Penna by admitting physician and per their discussion they did not recommend any intervention at this time   Generalized weakness  - Neurology saw patient in consult and did no recommend any more neurodiagnostic tests - Physical therapy evaluated patient and recommended home health with physical therapy. - Patient had right knee pain and swelling.  Was evaluated by orthopaedic surgeon who recommended knee aspiration with injection of depomedrol and lidocaine.  Patient is feeling better after procedure.  UTI  Has received 2 days of rocephin Urine culture grew out E. Coli - Will d/c on cipro for 2 more days for uncomplicated uti.  OA  prescrive oxycodone IR prn for pain  Ortho consulted while in house and aspirated right knee effusion. Please see above. Patient to follow up with orthopaedic surgeon as per discussion with his orthopaedic surgeon while in house.   Procedures:  Joint aspiration and injection please see above  Consultations:  Orthopaedic surgeon: Dr. Durene Romans  Neurology: Dr. Roseanne Reno  Discussed by admission physician with Dr. Darrick Penna  (vascular)  Discharge Exam: Filed Vitals:   05/20/12 1121 05/20/12 1501 05/20/12 2142 05/21/12 0542  BP: 131/74 127/79 148/61 130/71  Pulse: 100 107 100 70  Temp: 98.2 F (36.8 C) 99 F (37.2 C) 100 F (37.8 C) 97.6 F (36.4 C)  TempSrc:  Oral Oral Oral  Resp: 19 20 18 18   Height:      Weight:      SpO2: 93% 94% 94% 92%    General: Pt in NAD, Alert and Awake Cardiovascular: RRR, no mrg Respiratory: cTA BL, no wheezes  Discharge Instructions  Discharge Orders   Future Orders Complete By Expires     Call MD for:  extreme fatigue  As directed     Call MD for:  redness, tenderness, or signs of infection (pain, swelling, redness, odor or green/yellow discharge around incision site)  As directed     Call MD for:  severe uncontrolled pain  As directed     Call MD for:  temperature >100.4  As directed     Diet - low sodium heart healthy  As directed     Discharge instructions  As directed     Comments:      Discharge home with home health    Increase activity slowly  As directed         Medication List    STOP taking these medications       dexbrompheniramine-pseudoephedrine 6-120 MG per tablet  Commonly known as:  DRIXORAL      TAKE these medications       acetaminophen 500 MG tablet  Commonly known as:  TYLENOL  Take 1,000 mg by mouth every 6 (six) hours as needed. For pain/fever     aspirin EC 81 MG tablet  Take 81 mg by mouth daily.     doxazosin 8 MG tablet  Commonly known as:  CARDURA  Take 8 mg by mouth at bedtime.     simvastatin 80 MG tablet  Commonly known as:  ZOCOR  Take 80 mg by mouth at bedtime.           Follow-up Information   Follow up with [name],RONALD A, MD. Schedule an appointment as soon as possible for a visit in 3 weeks.   Contact information:   [address], [address] [place] [address] [phone]        The results of significant diagnostics from this hospitalization (including  imaging, microbiology, ancillary and laboratory) are listed below for reference.    Significant Diagnostic Studies: Mr Lumbar Spine W Wo Contrast  05/20/2012  *RADIOLOGY REPORT*  Clinical Data: Inability to walk.  Weakness.  Aortic stent graft 3 weeks ago.  MRI LUMBAR SPINE WITHOUT AND WITH CONTRAST  Technique:  Multiplanar and multiecho pulse sequences of the lumbar spine were obtained without and with intravenous contrast.  Contrast: 20mL MULTIHANCE GADOBENATE DIMEGLUMINE 529 MG/ML IV SOLN  Comparison: CTA abdomen 05/19/2012  Findings: Recent abdominal aortic stentgraft.  For aneurysm.  Upper abdominal aneurysm measures 6 mm.  Infrarenal abdominal   aneurysm has been treated with stent graft.  See separate CT report.  Normal lumbar alignment.  Negative for fracture or mass.  The distal spinal cord and conus medullaris appears normal.  Imaging is performed to the T10 level.  Postcontrast imaging reveals normal enhancement.  L1-2:  Negative  L2-3:  Disc bulging and facet degeneration with mild spinal stenosis  L3-4:  Mild disc bulging.  Moderate facet and ligamentum flavum hypertrophy with moderate spinal stenosis.  L4-5:  Disc degeneration with disc bulging and spurring.  Bilateral facet hypertrophy without significant spinal stenosis.  L5 S1:  Mild disc degeneration.  Advanced facet degeneration. Synovial cyst projects posterior to the left L5 and L1 facet and is not causing neural impingement.  Spinal canal is not narrowed.  IMPRESSION: No evidence of cord infarct or cord compression extending up to the T10 level.  The remainder of the thoracic cord is not evaluated on this study.  Abdominal aortic aneurysm with stent graft, as described in more detail on the recent CT A.  Lumbar degenerative changes with mild spinal stenosis at L2-3 and moderate spinal stenosis at L3-4.   Original Report Authenticated By: Janeece Riggers, M.D.    Dg Knee Complete 4 Views Right  05/19/2012  *RADIOLOGY REPORT*  Clinical Data:  Knee pain  RIGHT KNEE - COMPLETE 4+ VIEW  Comparison: None.  Findings: Four views of the right knee submitted.  No acute fracture or subluxation.  Significant narrowing of medial joint compartment.  There is spurring of medial and lateral tibial plateau.  Spurring of femoral condyles.  Significant narrowing of patellofemoral joint space.  Spurring of patella.  Moderate joint effusion.  IMPRESSION: No acute fracture or subluxation.  Significant degenerative changes as described above.  Moderate joint effusion.   Original Report Authenticated By: Natasha Mead, M.D.    Ct Angio Abd/pel W/ And/or W/o  05/19/2012  *RADIOLOGY REPORT*  Clinical Data:  AAA repair 3 weeks ago, new right leg weakness evaluate for leak  CT ANGIOGRAPHY ABDOMEN AND PELVIS  Technique:  Multidetector CT  imaging of the abdomen and pelvis was performed using the standard protocol during bolus administration of intravenous contrast.  Multiplanar reconstructed images including MIPs were obtained and reviewed to evaluate the vascular anatomy.  Contrast: OMNIPAQUE IOHEXOL 350 MG/ML SOLN  Comparison:  Most recent prior CT abdomen and pelvis 03/02/2012  Findings:  VASCULAR  Aorta: Stable thoracoabdominal aortic aneurysm measures 5.8 x 5.9 cm in greatest transverse dimension (measured in the axial plane just above the celiac artery origin).  There is a fair amount of wall adherent thrombus within the aneurysmal segment.  The patient is status post endovascular aortic repair of a bilobed infrarenal aortic aneurysm. The more cephalad aneurysmal component measures 3.3 x 3.9 cm which is unchanged.  The more inferior aneurysmal component beginning at the bifurcation and predominately involving the right common iliac artery is also unchanged at 4.3 cm in greatest diameter.  The endograft appears to be a Gore endoprosthesis extending from just below the renal arteries into the bilateral common iliac arteries.  Additionally, there are kissing stent grafts on  the right extending into the external and internal iliac arteries.  No evidence of limb occlusion. No definite endoleak on the arterial phase images.  On image 80 of series 4 there is a suggestion of contrast enhancement at the origin of the IMA which could represent a tiny type 2 endoleak. This does not expand on the delayed images.  On the delayed phase, there is no evidence of enhancement of the excluded upper portion of the aneurysm sac.  Unfortunately, the delayed images do not extend to the right common iliac artery aneurysm sac.  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  The artery is widely patent.  Conventional hepatic arterial anatomy.  SMA: Widely patent.  Renals: Single bilateral ring roll arteries are patent.  There is atherosclerotic calcification at the origins bilaterally which results in perhaps mild narrowing.  IMA: The IMA opacifies via retrograde flow.  Inflow: Successful exclusion of the right common iliac artery aneurysm as described above.  The excluded aneurysm sac measures 4.3 cm in greatest diameter which is unchanged compared to prior. No opacification of the aneurysm sac on the arterial phase images to suggest endoleak.  Unfortunately, this area is not included in the field of view on the delayed images. A second bifurcated endograft extends from the right limb of the primary endograft into the internal and external iliac arteries.  Additional limb extenders provide distal seal in the internal and external iliac arteries.  There is mild kinking of the extender stent graft in the external iliac artery which does not appear flow limiting.  The stent grafts are widely patent.  Minimal atherosclerotic disease is noted in the bilateral iliac systems.  No focal stenosis.  Proximal Outflow: Surgical changes of probable bilateral femoral cut downs.  No evidence of pseudoaneurysm.  The common femoral arteries are patent.  Visualized proximal superficial profunda  femoral arteries are also patent.  Veins: Given limitations of non venous phase timing, no focal venous abnormality is identified.  NON-VASCULAR  Lower Chest:  Elevation of the left hemidiaphragm with left lower lobe atelectasis.  The lung bases are otherwise clear.  No pleural effusion, or focal consolidation. The visualized heart is within normal limits for size.  No pericardial effusion.  Diffuse atherosclerotic vascular calcifications throughout the coronary arteries.  Lower thoracic esophagus is unremarkable.  Abdomen: Unremarkable appearance of the stomach, spleen, duodenum, adrenal glands and liver. Gallbladder is unremarkable. No intra or extrahepatic  biliary ductal dilatation. Fatty atrophy of the pancreas.  No focal lesion.  Stable 3.8 cm cyst exophytic from the upper pole of the left kidney.  Stable 1 cm cyst in the lower pole of the left kidney.  A large 1.5 cm calculus is identified in the lower pole of the left kidney extending into the lower pole infundibula.  No hydronephrosis.  No solid renal mass.  Normal-caliber large small bowel throughout the abdomen.  No evidence of bowel obstruction.  Scattered colonic diverticula without active inflammation.  No free fluid or suspicious adenopathy.  Pelvis: The bladder is collapsed.  Surgical clips identified in the region of the prostate gland.  Normal seminal vesicles.  No free fluid or suspicious adenopathy.  Bones/Soft Tissues: No acute fracture or aggressive appearing lytic or blastic osseous lesion.  Omental fat containing midline ventral abdominal hernia just above the umbilicus appears similar to prior.   Review of the MIP images confirms the above findings.  IMPRESSION:  1.  Fusiform infrarenal abdominal aortic aneurysm with the lower segment predominately involving the right common iliac artery status post endovascular aortic repair with two bifurcated endografts and extension limbs into the right internal and external iliac arteries without  evidence of complication.  The excluded aneurysm sacs remain unchanged in size compared to 03/02/2012. Please note that the right common iliac artery aneurysm sac is not imaged on the delayed series.  There is mild kinking of the extension limb of the right external iliac artery, however this does not appear flow limiting.  2.  Suggestion of a tiny type 2 endoleak at the IMA origin.  This region is small enough that it may simply represent artifact and does not appear to expand on the delayed images.  Recommend attention on routine follow-up imaging.  3. Stable 5.9 cm thoracoabdominal aortic aneurysm with peripheral wall adherent mural thrombus.  4.  Diffuse atherosclerotic vascular disease including coronary artery disease  5. Additional ancillary findings as above without significant interval change.  Signed,  Sterling Big, MD Vascular & Interventional Radiologist [facility]   Original Report Authenticated By: Malachy Moan, M.D.     Microbiology: Recent Results (from the past 240 hour(s))  URINE CULTURE     Status: None   Collection Time    05/19/12 12:28 PM      Result Value Range Status   Specimen Description URINE, CLEAN CATCH   Final   Special Requests URINE, CLEAN CATCH   Final   Culture  Setup Time 05/20/2012 00:47   Final   Colony Count >=100,000 COLONIES/ML   Final   Culture ESCHERICHIA COLI   Final   Report Status PENDING   Incomplete     Labs: Basic Metabolic Panel:  Recent Labs Lab 05/19/12 1232 05/20/12 0617 05/21/12 0540  NA 135 138 136  K 4.0 4.0 4.4  CL 99 103 101  CO2 27 27 25   GLUCOSE 134* 115* 126*  BUN 20 20 18   CREATININE 1.04 1.06 1.00  CALCIUM 9.7 9.4 9.3   Liver Function Tests:  Recent Labs Lab 05/20/12 0617  AST 15  ALT 19  ALKPHOS 52  BILITOT 0.5  PROT 6.3  ALBUMIN 2.4*   No results found for this basename: LIPASE, AMYLASE,  in the last 168 hours No results found for this basename: AMMONIA,  in the last 168  hours CBC:  Recent Labs Lab 05/19/12 1232 05/20/12 0617  WBC 13.4* 9.3  NEUTROABS 11.6*  --   HGB 13.2 12.2*  HCT 39.8 36.9*  MCV 85.6 85.2  PLT 187 175   Cardiac Enzymes: No results found for this basename: CKTOTAL, CKMB, CKMBINDEX, TROPONINI,  in the last 168 hours BNP: BNP (last 3 results) No results found for this basename: PROBNP,  in the last 8760 hours CBG: No results found for this basename: GLUCAP,  in the last 168 hours     Signed:  Penny Pia  [facility] 05/21/2012, 1:14 PM

## 2012-05-21 NOTE — Progress Notes (Signed)
NURSING PROGRESS NOTE  [name] [id number] Discharge Data: 05/21/2012 2:17 PM Attending Shanvi Moyd: Penny Pia, MD WUJ:[email or web address],NFAOZ, MD     Daralene Milch to be D/C'd Home per MD order.  Discussed with the patient the After Visit Summary and all questions fully answered. All IV's discontinued with no bleeding noted. All belongings returned to patient for patient to take home.   Last Vital Signs:  Blood pressure 130/71, pulse 70, temperature 97.6 F (36.4 C), temperature source Oral, resp. rate 18, height 6\' 1"  (1.854 m), weight 121.4 kg (267 lb 10.2 oz), SpO2 92.00%.  Discharge Medication List   Medication List    STOP taking these medications       dexbrompheniramine-pseudoephedrine 6-120 MG per tablet  Commonly known as:  DRIXORAL      TAKE these medications       acetaminophen 500 MG tablet  Commonly known as:  TYLENOL  Take 1,000 mg by mouth every 6 (six) hours as needed. For pain/fever     aspirin EC 81 MG tablet  Take 81 mg by mouth daily.     ciprofloxacin 250 MG tablet  Commonly known as:  CIPRO  Take 1 tablet (250 mg total) by mouth 2 (two) times daily.     doxazosin 8 MG tablet  Commonly known as:  CARDURA  Take 8 mg by mouth at bedtime.     oxyCODONE 5 MG immediate release tablet  Commonly known as:  Oxy IR/ROXICODONE  Take 1 tablet (5 mg total) by mouth every 4 (four) hours as needed for pain.     simvastatin 80 MG tablet  Commonly known as:  ZOCOR  Take 80 mg by mouth at bedtime.        Rosalie Doctor, RN

## 2012-05-21 NOTE — Consult Note (Signed)
Reason for Consult: Right knee pain associated with decreased functional capacity and activity Referring Physician:  Vega,O  [name] is an 79 y.o. male.  HPI: 79 yo male with recent lethargy and decrease functional activity.  Admitted for basically failing to thrive.  Medical team recognized right knee swelling and pain and questioned whether this could be contributed to his recent decline.  Has recent history of aneurysmal surgery complicating activity level.  Has history of left total knee replacement.  No reports of fever chills or night sweats associated with this right knee pain onset.   Past Medical History  Diagnosis Date  . Hypertension   . BPH (benign prostatic hyperplasia)   . Hyperlipidemia   . AAA (abdominal aortic aneurysm)   . Cancer     prostate    Past Surgical History  Procedure Laterality Date  . Abdominal surgery    . Appendectomy    . Hernia repair    . Bladder surgery  2008  . Total knee arthroplasty  1990    left  . Abdominal aortic aneurysm repair  04/2012    No family history on file.  Social History:  reports that he quit smoking about 27 years ago. His smoking use included Cigarettes. He smoked 0.00 packs per day. He has never used smokeless tobacco. He reports that he does not drink alcohol or use illicit drugs.  Allergies:  Allergies  Allergen Reactions  . Penicillins Other (See Comments)    Unknown. Childhood allergy    Medications:  I have reviewed the patient's current medications. Scheduled: . aspirin EC  81 mg Oral Daily  . atorvastatin  40 mg Oral q1800  . cefTRIAXone (ROCEPHIN)  IV  1 g Intravenous Q24H  . doxazosin  8 mg Oral QHS  . feeding supplement  237 mL Oral TID WC    Results for orders placed during the hospital encounter of 05/19/12 (from the past 24 hour(s))  BASIC METABOLIC PANEL     Status: Abnormal   Collection Time    05/21/12  5:40 AM      Result Value Range   Sodium 136  135 - 145 mEq/L   Potassium 4.4   3.5 - 5.1 mEq/L   Chloride 101  96 - 112 mEq/L   CO2 25  19 - 32 mEq/L   Glucose, Bld 126 (*) 70 - 99 mg/dL   BUN 18  6 - 23 mg/dL   Creatinine, Ser 1.61  0.50 - 1.35 mg/dL   Calcium 9.3  8.4 - 09.6 mg/dL   GFR calc non Af Amer 69 (*) >90 mL/min   GFR calc Af Amer 80 (*) >90 mL/min    X-ray: Advanced right knee arthritis, no acute fracture Positive for large knee effusion  Review of Systems:  HEENT: Denies headache, blurred vision, runny nose, sore throat,  Neck: Denies thyroid problems,lymphadenopathy  Chest : Denies shortness of breath, no history of COPD  Heart : Denies Chest pain, coronary arterey disease  GI: Denies nausea, vomiting, diarrhea, constipation  GU: Denies dysuria, urgency, frequency of urination, hematuria  Neuro: Denies stroke,  Musculoskeletal: painful swelling right knee General reports of weakness   Blood pressure 130/71, pulse 70, temperature 97.6 F (36.4 C), temperature source Oral, resp. rate 18, height 6\' 1"  (1.854 m), weight 121.4 kg (267 lb 10.2 oz), SpO2 92.00%.  Exam: Awake alert In bed somewhat comfortable without moving right lower leg Daughter at bedside  Right knee large effusion, tender to palpation,  painful motion - but not excessively  history of left TKR with healed incision no warmth, effusion Otherwise neuro stable intact sensibility  General medical exam reviewed from admission work  Assessment/Plan: Advanced right knee arthritis associated with larger effusion/pain questioning correlation with decreased functional ability  After reviewing his right and left knee issues reviewed with he and his daughter a plan that consisted of aspiration and injection of the right knee, ice and subsequent follow up in our office   Under sterile clean prep I aspirated 70-80cc of synovial fluid, slightly inflammatory in appearance but non infectious I then injected the knee with 80mg  depomedrol and 5 cc 1% lidocaine  Follow up with [name] his  primary Orthopaedic surgeon at [facility] D 05/21/2012, 9:03 AM

## 2012-05-22 LAB — URINE CULTURE: Colony Count: >=100000

## 2012-05-29 NOTE — ED Provider Notes (Signed)
Medical screening examination/treatment/procedure(s) were conducted as a shared visit with non-physician practitioner(s) and myself.  I personally evaluated the patient during the encounter.  Patient unable to walk. I recommended admission for this patient. Hospitalist consultant will evaluate patient. Decision to admit or discharge is to be made by the hospitalist  Donnetta Hutching, MD 05/29/12 [phone]

## 2012-06-07 ENCOUNTER — Telehealth (HOSPITAL_COMMUNITY): Payer: Self-pay | Admitting: Emergency Medicine

## 2012-07-12 ENCOUNTER — Encounter (HOSPITAL_COMMUNITY): Payer: Self-pay | Admitting: Pharmacy Technician

## 2012-07-12 NOTE — Progress Notes (Signed)
Dr Darrelyn Hillock-  NEED PRE OP ORDERS PLEASE  THANK YOU

## 2012-07-18 ENCOUNTER — Encounter (HOSPITAL_COMMUNITY)
Admission: RE | Admit: 2012-07-18 | Discharge: 2012-07-18 | Disposition: A | Discharge status: Home / Self Care | Payer: Medicare Other | Source: Ambulatory Visit | Attending: Orthopedic Surgery | Admitting: Orthopedic Surgery

## 2012-07-18 ENCOUNTER — Ambulatory Visit (HOSPITAL_COMMUNITY)
Admission: RE | Admit: 2012-07-18 | Discharge: 2012-07-18 | Disposition: A | Discharge status: Home / Self Care | Payer: Medicare Other | Source: Ambulatory Visit | Attending: Orthopedic Surgery | Admitting: Orthopedic Surgery

## 2012-07-18 ENCOUNTER — Encounter (HOSPITAL_COMMUNITY): Payer: Self-pay

## 2012-07-18 DIAGNOSIS — Z01818 Encounter for other preprocedural examination: Secondary | ICD-10-CM | POA: Insufficient documentation

## 2012-07-18 DIAGNOSIS — Z0183 Encounter for blood typing: Secondary | ICD-10-CM | POA: Insufficient documentation

## 2012-07-18 DIAGNOSIS — I10 Essential (primary) hypertension: Secondary | ICD-10-CM | POA: Insufficient documentation

## 2012-07-18 DIAGNOSIS — M5380 Other specified dorsopathies, site unspecified: Secondary | ICD-10-CM | POA: Insufficient documentation

## 2012-07-18 DIAGNOSIS — I700 Atherosclerosis of aorta: Secondary | ICD-10-CM | POA: Insufficient documentation

## 2012-07-18 DIAGNOSIS — Z01812 Encounter for preprocedural laboratory examination: Secondary | ICD-10-CM | POA: Insufficient documentation

## 2012-07-18 LAB — COMPREHENSIVE METABOLIC PANEL
ALT: 12 U/L (ref 0–53)
AST: 18 U/L (ref 0–37)
Albumin: 3.1 g/dL — ABNORMAL LOW (ref 3.5–5.2)
Alkaline Phosphatase: 59 U/L (ref 39–117)
BUN: 20 mg/dL (ref 6–23)
CO2: 27 mEq/L (ref 19–32)
Calcium: 9.7 mg/dL (ref 8.4–10.5)
Chloride: 106 mEq/L (ref 96–112)
Creatinine, Ser: 1.2 mg/dL (ref 0.50–1.35)
GFR calc Af Amer: 64 mL/min — ABNORMAL LOW (ref >90)
GFR calc non Af Amer: 55 mL/min — ABNORMAL LOW (ref >90)
Glucose, Bld: 122 mg/dL — ABNORMAL HIGH (ref 70–99)
Potassium: 4.3 mEq/L (ref 3.5–5.1)
Sodium: 141 mEq/L (ref 135–145)
Total Bilirubin: 0.3 mg/dL (ref 0.3–1.2)
Total Protein: 6.2 g/dL (ref 6.0–8.3)

## 2012-07-18 LAB — SURGICAL PCR SCREEN: Staphylococcus aureus: NEGATIVE

## 2012-07-18 LAB — URINALYSIS, ROUTINE W REFLEX MICROSCOPIC
Bilirubin Urine: NEGATIVE
Glucose, UA: NEGATIVE mg/dL
Ketones, ur: NEGATIVE mg/dL
Leukocytes, UA: NEGATIVE
Nitrite: NEGATIVE
Protein, ur: NEGATIVE mg/dL
Specific Gravity, Urine: 1.028 (ref 1.005–1.030)
Urobilinogen, UA: 0.2 mg/dL (ref 0.0–1.0)
pH: 5 (ref 5.0–8.0)

## 2012-07-18 LAB — APTT: aPTT: 30 seconds (ref 24–37)

## 2012-07-18 LAB — URINE MICROSCOPIC-ADD ON

## 2012-07-18 LAB — CBC
HCT: 40.6 % (ref 39.0–52.0)
Hemoglobin: 13 g/dL (ref 13.0–17.0)
MCV: 86.2 fL (ref 78.0–100.0)
RBC: 4.71 MIL/uL (ref 4.22–5.81)
WBC: 5.4 10*3/uL (ref 4.0–10.5)

## 2012-07-18 LAB — PROTIME-INR
INR: 1.03 (ref 0.00–1.49)
Prothrombin Time: 13.4 seconds (ref 11.6–15.2)

## 2012-07-18 LAB — ABO/RH: ABO/RH(D): O POS

## 2012-07-18 MED ORDER — CHLORHEXIDINE GLUCONATE 4 % EX LIQD: 60.0000 mL | Freq: Once | CUTANEOUS | Status: DC

## 2012-07-18 NOTE — Progress Notes (Signed)
07/18/12 1355  OBSTRUCTIVE SLEEP APNEA  Have you ever been diagnosed with sleep apnea through a sleep study? No  Do you snore loudly (loud enough to be heard through closed doors)?  0  Do you often feel tired, fatigued, or sleepy during the daytime? 0  Has anyone observed you stop breathing during your sleep? 0  Do you have, or are you being treated for high blood pressure? 1  BMI more than 35 kg/m2? 1  Age over 77 years old? 1  Neck circumference greater than 40 cm/18 inches? 1  Gender: 1  Obstructive Sleep Apnea Score 5  Score 4 or greater  Results sent to PCP

## 2012-07-18 NOTE — Pre-Procedure Instructions (Signed)
CXR WAS DONE TODAY AT [facility] - PREOP. PT HAS EKG REPORT AND OFFICE NOTE 07/06/12 AND MEDICAL CLEARANCE FOR RIGHT TKA FROM DR. Martyn Ehrich ARE ON PT'S CHART.

## 2012-07-18 NOTE — Patient Instructions (Signed)
YOUR SURGERY IS SCHEDULED AT [facility]  ON:  Wednesday  5/14  REPORT TO [place] SHORT STAY [facility]ENTER AT:  11:00 AM      PHONE # FOR SHORT STAY IS [phone]  DO NOT EAT ANYTHING AFTER MIDNIGHT THE NIGHT BEFORE YOUR SURGERY.   NO FOOD, NO CHEWING GUM, NO MINTS, NO CANDIES, NO CHEWING TOBACCO. YOU MAY HAVE CLEAR LIQUIDS TO DRINK FROM MIDNIGHT UNTIL 7:30 AM DAY OF SURGERY  -  LIKE WATER  --NOTHING TO DRINK AFTER 7:30 AM DAY OF YOUR SURGERY.  PLEASE TAKE THE FOLLOWING MEDICATIONS THE AM OF YOUR SURGERY WITH A FEW SIPS OF WATER:  TOLTERODINE ( DETROL LA )    DO NOT BRING VALUABLES, MONEY, CREDIT CARDS.  DO NOT WEAR JEWELRY, MAKE-UP, NAIL POLISH AND NO METAL PINS OR CLIPS IN YOUR HAIR. CONTACT LENS, DENTURES / PARTIALS, GLASSES SHOULD NOT BE WORN TO SURGERY AND IN MOST CASES-HEARING AIDS WILL NEED TO BE REMOVED.  BRING YOUR GLASSES CASE, ANY EQUIPMENT NEEDED FOR YOUR CONTACT LENS. FOR PATIENTS ADMITTED TO THE HOSPITAL--CHECK OUT TIME THE DAY OF DISCHARGE IS 11:00 AM.  ALL INPATIENT ROOMS ARE PRIVATE - WITH BATHROOM, TELEPHONE, TELEVISION AND WIFI INTERNET.                             PLEASE READ OVER ANY  FACT SHEETS THAT YOU WERE GIVEN: MRSA INFORMATION, BLOOD TRANSFUSION INFORMATION, INCENTIVE SPIROMETER INFORMATION. FAILURE TO FOLLOW THESE INSTRUCTIONS MAY RESULT IN THE CANCELLATION OF YOUR SURGERY.   PATIENT SIGNATURE_________________________________

## 2012-07-19 NOTE — Pre-Procedure Instructions (Signed)
PT'S PREOP CBC REPORT FAXED TO DR. [name]'S OFFICE - PLATELETS 126,000.

## 2012-07-25 NOTE — H&P (Signed)
TOTAL KNEE ADMISSION H&P  Patient is being admitted for right total knee arthroplasty.  Subjective:  Chief Complaint:right knee pain.  HPI: [name], 79 y.o. male, has a history of pain and functional disability in the right knee due to arthritis and has failed non-surgical conservative treatments for greater than 12 weeks to includeNSAID's and/or analgesics, corticosteriod injections, use of assistive devices and activity modification.  Onset of symptoms was gradual, starting >10 years ago with gradually worsening course since that time. The patient noted no past surgery on the right knee(s).  Patient currently rates pain in the right knee(s) at 7 out of 10 with activity. Patient has night pain, worsening of pain with activity and weight bearing, pain that interferes with activities of daily living, pain with passive range of motion, crepitus and joint swelling.  Patient has evidence of periarticular osteophytes and joint space narrowing by imaging studies. There is no active infection.  Patient Active Problem List   Diagnosis Date Noted  . Weakness of right leg 05/19/2012  . HTN (hypertension) 05/19/2012  . Abdominal aneurysm without mention of rupture 03/28/2012   Past Medical History  Diagnosis Date  . Hypertension   . BPH (benign prostatic hyperplasia)   . Hyperlipidemia   . Cancer 2008-2009    prostate TREATED WITH RADIATION  . AAA (abdominal aortic aneurysm) 04/2012    STENTING OF AAA IN [place]    Past Surgical History  Procedure Laterality Date  . Abdominal surgery      PART OF COLON REMOVED FOR DIVERTICULITIS  . Appendectomy    . Hernia repair    . Bladder surgery  2008    FOR BLADDER STONE  . Total knee arthroplasty  1990    left  . Abdominal aortic aneurysm repair  04/2012     Current outpatient prescriptions: aspirin EC 81 MG tablet, Take 81 mg by mouth daily., Disp: , Rfl: ;   doxazosin (CARDURA) 8 MG tablet, Take 8 mg by mouth at bedtime. , Disp: ,  Rfl: ;   oxyCODONE (OXY IR/ROXICODONE) 5 MG immediate release tablet, Take 1 tablet (5 mg total) by mouth every 4 (four) hours as needed for pain., Disp: 30 tablet, Rfl: 0;   simvastatin (ZOCOR) 40 MG tablet, Take 40 mg by mouth every evening., Disp: , Rfl:  tolterodine (DETROL LA) 4 MG 24 hr capsule, Take 4 mg by mouth daily. TAKES IN AM, Disp: , Rfl:   Allergies  Allergen Reactions  . Penicillins Other (See Comments)    HIVES     History  Substance Use Topics  . Smoking status: Former Smoker    Types: Cigarettes    Quit date: 03/15/1985  . Smokeless tobacco: Never Used  . Alcohol Use: No    Family History Father deceased age 66 due to cancer Mother deceased age 62 due to MI  Review of Systems  Constitutional: Negative.   HENT: Negative.  Negative for neck pain.   Eyes: Negative.   Respiratory: Negative.   Cardiovascular: Negative.   Gastrointestinal: Negative.   Genitourinary: Negative.   Musculoskeletal: Positive for joint pain. Negative for myalgias, back pain and falls.       Right knee pain  Skin: Negative.   Neurological: Negative.   Endo/Heme/Allergies: Negative.     Objective:  Physical Exam  Constitutional: He is oriented to person, place, and time. He appears well-developed and well-nourished. No distress.  HENT:  Head: Normocephalic and atraumatic.  Right Ear: External ear normal.  Left Ear: External ear normal.  Nose: Nose normal.  Mouth/Throat: Oropharynx is clear and moist.  Eyes: Conjunctivae and EOM are normal.  Neck: Normal range of motion. Neck supple. No tracheal deviation present. No thyromegaly present.  Cardiovascular: Normal rate, regular rhythm, normal heart sounds and intact distal pulses.   No murmur heard. Respiratory: Effort normal. No respiratory distress. He has decreased breath sounds. He has no wheezes. He exhibits no tenderness.  GI: Soft. Bowel sounds are normal. He exhibits no distension and no mass. There is no tenderness.   Musculoskeletal:       Right hip: Normal.       Left hip: Normal.       Right knee: He exhibits decreased range of motion and swelling. He exhibits no effusion and no erythema. Tenderness found. Medial joint line and lateral joint line tenderness noted.       Left knee: Normal.       Right lower leg: He exhibits no tenderness and no swelling.       Left lower leg: He exhibits no tenderness and no swelling.  Lymphadenopathy:    He has no cervical adenopathy.  Neurological: He is alert and oriented to person, place, and time. He has normal strength and normal reflexes. No sensory deficit.  Skin: No rash noted. He is not diaphoretic. No erythema.  Psychiatric: He has a normal mood and affect. His behavior is normal.    Vitals Pulse: 93 (Regular) BP: 101/75 (Sitting, Left Arm, Standard)   Estimated body mass index is 35.32 kg/(m^2) as calculated from the following:   Height as of 05/20/12: 6\' 1"  (1.854 m).   Weight as of 05/19/12: 121.4 kg (267 lb 10.2 oz).   Imaging Review Plain radiographs demonstrate severe degenerative joint disease of the right knee(s). The overall alignment issignificant varus. The bone quality appears to be good for age and reported activity level.  Assessment/Plan:  End stage arthritis, right knee   The patient history, physical examination, clinical judgment of the provider and imaging studies are consistent with end stage degenerative joint disease of the right knee(s) and total knee arthroplasty is deemed medically necessary. The treatment options including medical management, injection therapy arthroscopy and arthroplasty were discussed at length. The risks and benefits of total knee arthroplasty were presented and reviewed. The risks due to aseptic loosening, infection, stiffness, patella tracking problems, thromboembolic complications and other imponderables were discussed. The patient acknowledged the explanation, agreed to proceed with the plan and  consent was signed. Patient is being admitted for inpatient treatment for surgery, pain control, PT, OT, prophylactic antibiotics, VTE prophylaxis, progressive ambulation and ADL's and discharge planning. The patient is planning to be discharged to skilled nursing facility   [facility]. PA-C

## 2012-07-26 ENCOUNTER — Encounter (HOSPITAL_COMMUNITY): Payer: Self-pay | Admitting: Anesthesiology

## 2012-07-26 ENCOUNTER — Encounter (HOSPITAL_COMMUNITY): Payer: Self-pay | Admitting: *Deleted

## 2012-07-26 ENCOUNTER — Inpatient Hospital Stay (HOSPITAL_COMMUNITY): Payer: Medicare Other | Admitting: Anesthesiology

## 2012-07-26 ENCOUNTER — Inpatient Hospital Stay (HOSPITAL_COMMUNITY)
Admission: RE | Admit: 2012-07-26 | Discharge: 2012-07-29 | DRG: 470 | Disposition: A | Discharge status: Skilled Nursing Facility | Payer: Medicare Other | Source: Ambulatory Visit | Attending: Orthopedic Surgery | Admitting: Orthopedic Surgery

## 2012-07-26 ENCOUNTER — Encounter (HOSPITAL_COMMUNITY)
Admission: RE | Disposition: A | Discharge status: Skilled Nursing Facility | Payer: Self-pay | Source: Ambulatory Visit | Attending: Orthopedic Surgery

## 2012-07-26 ENCOUNTER — Inpatient Hospital Stay (HOSPITAL_COMMUNITY): Payer: Medicare Other

## 2012-07-26 DIAGNOSIS — Z96651 Presence of right artificial knee joint: Secondary | ICD-10-CM

## 2012-07-26 DIAGNOSIS — M1710 Unilateral primary osteoarthritis, unspecified knee: Principal | ICD-10-CM | POA: Diagnosis present

## 2012-07-26 DIAGNOSIS — I10 Essential (primary) hypertension: Secondary | ICD-10-CM | POA: Diagnosis present

## 2012-07-26 DIAGNOSIS — N400 Enlarged prostate without lower urinary tract symptoms: Secondary | ICD-10-CM | POA: Diagnosis present

## 2012-07-26 DIAGNOSIS — M1711 Unilateral primary osteoarthritis, right knee: Secondary | ICD-10-CM | POA: Diagnosis present

## 2012-07-26 DIAGNOSIS — D62 Acute posthemorrhagic anemia: Secondary | ICD-10-CM | POA: Diagnosis not present

## 2012-07-26 DIAGNOSIS — E785 Hyperlipidemia, unspecified: Secondary | ICD-10-CM | POA: Diagnosis present

## 2012-07-26 HISTORY — PX: TOTAL KNEE ARTHROPLASTY: SHX125

## 2012-07-26 LAB — TYPE AND SCREEN
ABO/RH(D): O POS
Antibody Screen: NEGATIVE

## 2012-07-26 SURGERY — ARTHROPLASTY, KNEE, TOTAL
Anesthesia: General | Site: Knee | Laterality: Right | Wound class: Clean

## 2012-07-26 MED ORDER — ACETAMINOPHEN 10 MG/ML IV SOLN
INTRAVENOUS | Status: DC | PRN
  Administered 2012-07-26: 1000 mg via INTRAVENOUS

## 2012-07-26 MED ORDER — SODIUM CHLORIDE 0.9 % IR SOLN
Status: DC | PRN
  Administered 2012-07-26: 13:00:00

## 2012-07-26 MED ORDER — RIVAROXABAN 10 MG PO TABS
10.0000 mg | ORAL_TABLET | Freq: Every day | ORAL | Status: DC
  Administered 2012-07-27 – 2012-07-29 (×3): 10 mg via ORAL
  Filled 2012-07-26 (×4): qty 1

## 2012-07-26 MED ORDER — HYDROCODONE-ACETAMINOPHEN 5-325 MG PO TABS
1.0000 | ORAL_TABLET | ORAL | Status: DC | PRN
  Administered 2012-07-26 – 2012-07-27 (×3): 2 via ORAL
  Filled 2012-07-26 (×3): qty 2

## 2012-07-26 MED ORDER — CLINDAMYCIN PHOSPHATE 600 MG/50ML IV SOLN
600.0000 mg | Freq: Four times a day (QID) | INTRAVENOUS | Status: AC
  Administered 2012-07-26 – 2012-07-27 (×2): 600 mg via INTRAVENOUS
  Filled 2012-07-26 (×2): qty 50

## 2012-07-26 MED ORDER — FENTANYL CITRATE 0.05 MG/ML IJ SOLN
INTRAMUSCULAR | Status: DC | PRN
  Administered 2012-07-26 (×3): 100 ug via INTRAVENOUS
  Administered 2012-07-26: 50 ug via INTRAVENOUS

## 2012-07-26 MED ORDER — FERROUS SULFATE 325 (65 FE) MG PO TABS
325.0000 mg | ORAL_TABLET | Freq: Three times a day (TID) | ORAL | Status: DC
  Administered 2012-07-27 – 2012-07-29 (×6): 325 mg via ORAL
  Filled 2012-07-26 (×11): qty 1

## 2012-07-26 MED ORDER — EPHEDRINE SULFATE 50 MG/ML IJ SOLN
INTRAMUSCULAR | Status: DC | PRN
  Administered 2012-07-26: 10 mg via INTRAVENOUS

## 2012-07-26 MED ORDER — LIDOCAINE HCL (PF) 2 % IJ SOLN
INTRAMUSCULAR | Status: DC | PRN
  Administered 2012-07-26: 75 mg

## 2012-07-26 MED ORDER — FESOTERODINE FUMARATE ER 4 MG PO TB24
4.0000 mg | ORAL_TABLET | Freq: Every day | ORAL | Status: DC
  Administered 2012-07-27 – 2012-07-29 (×3): 4 mg via ORAL
  Filled 2012-07-26 (×3): qty 1

## 2012-07-26 MED ORDER — METHOCARBAMOL 500 MG PO TABS
500.0000 mg | ORAL_TABLET | Freq: Four times a day (QID) | ORAL | Status: DC | PRN
  Administered 2012-07-26 – 2012-07-28 (×5): 500 mg via ORAL
  Filled 2012-07-26 (×5): qty 1

## 2012-07-26 MED ORDER — ONDANSETRON HCL 4 MG/2ML IJ SOLN
INTRAMUSCULAR | Status: DC | PRN
  Administered 2012-07-26: 4 mg via INTRAVENOUS

## 2012-07-26 MED ORDER — ONDANSETRON HCL 4 MG PO TABS
4.0000 mg | ORAL_TABLET | Freq: Four times a day (QID) | ORAL | Status: DC | PRN
  Filled 2012-07-26: qty 1

## 2012-07-26 MED ORDER — LACTATED RINGERS IV SOLN: INTRAVENOUS | Status: DC

## 2012-07-26 MED ORDER — PHENOL 1.4 % MT LIQD: 1.0000 | OROMUCOSAL | Status: DC | PRN

## 2012-07-26 MED ORDER — SIMVASTATIN 40 MG PO TABS
40.0000 mg | ORAL_TABLET | Freq: Every day | ORAL | Status: DC
  Administered 2012-07-26 – 2012-07-28 (×3): 40 mg via ORAL
  Filled 2012-07-26 (×5): qty 1

## 2012-07-26 MED ORDER — ACETAMINOPHEN 325 MG PO TABS: 650.0000 mg | ORAL_TABLET | Freq: Four times a day (QID) | ORAL | Status: DC | PRN

## 2012-07-26 MED ORDER — HYDROMORPHONE HCL PF 1 MG/ML IJ SOLN
0.2500 mg | INTRAMUSCULAR | Status: DC | PRN
  Administered 2012-07-26: 0.5 mg via INTRAVENOUS

## 2012-07-26 MED ORDER — LACTATED RINGERS IV SOLN
INTRAVENOUS | Status: DC
  Administered 2012-07-26 (×2): via INTRAVENOUS

## 2012-07-26 MED ORDER — METHOCARBAMOL 100 MG/ML IJ SOLN: 500.0000 mg | Freq: Four times a day (QID) | INTRAVENOUS | Status: DC | PRN

## 2012-07-26 MED ORDER — CLINDAMYCIN PHOSPHATE 900 MG/50ML IV SOLN
900.0000 mg | INTRAVENOUS | Status: AC
  Administered 2012-07-26: 900 mg via INTRAVENOUS
  Filled 2012-07-26: qty 50

## 2012-07-26 MED ORDER — NEOSTIGMINE METHYLSULFATE 1 MG/ML IJ SOLN
INTRAMUSCULAR | Status: DC | PRN
  Administered 2012-07-26: 4 mg via INTRAVENOUS

## 2012-07-26 MED ORDER — ROCURONIUM BROMIDE 100 MG/10ML IV SOLN
INTRAVENOUS | Status: DC | PRN
  Administered 2012-07-26: 60 mg via INTRAVENOUS

## 2012-07-26 MED ORDER — THROMBIN 5000 UNITS EX SOLR
OROMUCOSAL | Status: DC | PRN
  Administered 2012-07-26: 13:00:00 via TOPICAL

## 2012-07-26 MED ORDER — BUPIVACAINE LIPOSOME 1.3 % IJ SUSP
20.0000 mL | Freq: Once | INTRAMUSCULAR | Status: DC
  Filled 2012-07-26: qty 20

## 2012-07-26 MED ORDER — GLYCOPYRROLATE 0.2 MG/ML IJ SOLN
INTRAMUSCULAR | Status: DC | PRN
  Administered 2012-07-26: 0.6 mg via INTRAVENOUS

## 2012-07-26 MED ORDER — ALUM & MAG HYDROXIDE-SIMETH 200-200-20 MG/5ML PO SUSP
30.0000 mL | ORAL | Status: DC | PRN
  Filled 2012-07-26: qty 30

## 2012-07-26 MED ORDER — FLEET ENEMA 7-19 GM/118ML RE ENEM
1.0000 | ENEMA | Freq: Once | RECTAL | Status: AC | PRN
  Filled 2012-07-26: qty 1

## 2012-07-26 MED ORDER — SODIUM CHLORIDE 0.9 % IJ SOLN
INTRAMUSCULAR | Status: DC | PRN
  Administered 2012-07-26: 14:00:00

## 2012-07-26 MED ORDER — OXYCODONE-ACETAMINOPHEN 5-325 MG PO TABS
2.0000 | ORAL_TABLET | ORAL | Status: DC | PRN
  Administered 2012-07-26: 1 via ORAL
  Administered 2012-07-27 (×2): 2 via ORAL
  Filled 2012-07-26: qty 2
  Filled 2012-07-26: qty 1
  Filled 2012-07-26: qty 2

## 2012-07-26 MED ORDER — ONDANSETRON HCL 4 MG/2ML IJ SOLN: 4.0000 mg | Freq: Four times a day (QID) | INTRAMUSCULAR | Status: DC | PRN

## 2012-07-26 MED ORDER — POLYETHYLENE GLYCOL 3350 17 G PO PACK
17.0000 g | PACK | Freq: Every day | ORAL | Status: DC | PRN
  Filled 2012-07-26: qty 1

## 2012-07-26 MED ORDER — ACETAMINOPHEN 650 MG RE SUPP
650.0000 mg | Freq: Four times a day (QID) | RECTAL | Status: DC | PRN
  Filled 2012-07-26: qty 1

## 2012-07-26 MED ORDER — MENTHOL 3 MG MT LOZG: 1.0000 | LOZENGE | OROMUCOSAL | Status: DC | PRN

## 2012-07-26 MED ORDER — BISACODYL 10 MG RE SUPP
10.0000 mg | Freq: Every day | RECTAL | Status: DC | PRN
  Filled 2012-07-26: qty 1

## 2012-07-26 MED ORDER — HYDROMORPHONE HCL PF 1 MG/ML IJ SOLN
1.0000 mg | INTRAMUSCULAR | Status: DC | PRN
  Administered 2012-07-27: 1 mg via INTRAVENOUS
  Filled 2012-07-26: qty 1

## 2012-07-26 MED ORDER — PROPOFOL 10 MG/ML IV BOLUS
INTRAVENOUS | Status: DC | PRN
  Administered 2012-07-26: 140 mg via INTRAVENOUS

## 2012-07-26 MED ORDER — HYDROMORPHONE HCL PF 1 MG/ML IJ SOLN
INTRAMUSCULAR | Status: DC | PRN
  Administered 2012-07-26 (×4): 0.5 mg via INTRAVENOUS

## 2012-07-26 MED ORDER — SODIUM CHLORIDE 0.9 % IR SOLN
Status: DC | PRN
  Administered 2012-07-26: 1000 mL

## 2012-07-26 MED ORDER — DOXAZOSIN MESYLATE 8 MG PO TABS
8.0000 mg | ORAL_TABLET | Freq: Every day | ORAL | Status: DC
  Administered 2012-07-26 – 2012-07-27 (×2): 8 mg via ORAL
  Filled 2012-07-26 (×5): qty 1

## 2012-07-26 MED ORDER — CELECOXIB 200 MG PO CAPS
200.0000 mg | ORAL_CAPSULE | Freq: Two times a day (BID) | ORAL | Status: DC
  Administered 2012-07-26 – 2012-07-29 (×6): 200 mg via ORAL
  Filled 2012-07-26 (×8): qty 1

## 2012-07-26 MED ORDER — LACTATED RINGERS IV SOLN
INTRAVENOUS | Status: DC
  Administered 2012-07-26 (×2): via INTRAVENOUS

## 2012-07-26 SURGICAL SUPPLY — 63 items
BAG SPEC THK2 15X12 ZIP CLS (MISCELLANEOUS) ×1
BAG ZIPLOCK 12X15 (MISCELLANEOUS) ×2 IMPLANT
BANDAGE ELASTIC 4 VELCRO ST LF (GAUZE/BANDAGES/DRESSINGS) ×2 IMPLANT
BANDAGE ELASTIC 6 VELCRO ST LF (GAUZE/BANDAGES/DRESSINGS) ×2 IMPLANT
BANDAGE ESMARK 6X9 LF (GAUZE/BANDAGES/DRESSINGS) ×1 IMPLANT
BANDAGE GAUZE ELAST BULKY 4 IN (GAUZE/BANDAGES/DRESSINGS) ×3 IMPLANT
BLADE SAG 18X100X1.27 (BLADE) ×2 IMPLANT
BLADE SAW SGTL 11.0X1.19X90.0M (BLADE) ×2 IMPLANT
BNDG CMPR 9X6 STRL LF SNTH (GAUZE/BANDAGES/DRESSINGS) ×1
BNDG ESMARK 6X9 LF (GAUZE/BANDAGES/DRESSINGS) ×2
BONE CEMENT GENTAMICIN (Cement) ×4 IMPLANT
CEMENT BONE GENTAMICIN 40 (Cement) ×2 IMPLANT
CLOTH BEACON ORANGE TIMEOUT ST (SAFETY) ×2 IMPLANT
CLSR STERI-STRIP ANTIMIC 1/2X4 (GAUZE/BANDAGES/DRESSINGS) ×4 IMPLANT
CUFF TOURN SGL QUICK 34 (TOURNIQUET CUFF) ×2
CUFF TRNQT CYL 34X4X40X1 (TOURNIQUET CUFF) ×1 IMPLANT
DRAPE EXTREMITY T 121X128X90 (DRAPE) ×2 IMPLANT
DRAPE INCISE IOBAN 66X45 STRL (DRAPES) ×2 IMPLANT
DRAPE LG THREE QUARTER DISP (DRAPES) ×2 IMPLANT
DRAPE POUCH INSTRU U-SHP 10X18 (DRAPES) ×2 IMPLANT
DRAPE U-SHAPE 47X51 STRL (DRAPES) ×2 IMPLANT
DRSG ADAPTIC 3X8 NADH LF (GAUZE/BANDAGES/DRESSINGS) ×2 IMPLANT
DRSG PAD ABDOMINAL 8X10 ST (GAUZE/BANDAGES/DRESSINGS) ×5 IMPLANT
DURAPREP 26ML APPLICATOR (WOUND CARE) ×2 IMPLANT
ELECT BLADE TIP CTD 4 INCH (ELECTRODE) ×1 IMPLANT
ELECT REM PT RETURN 9FT ADLT (ELECTROSURGICAL) ×2
ELECTRODE REM PT RTRN 9FT ADLT (ELECTROSURGICAL) ×1 IMPLANT
EVACUATOR 1/8 PVC DRAIN (DRAIN) ×2 IMPLANT
FACESHIELD LNG OPTICON STERILE (SAFETY) ×12 IMPLANT
GLOVE BIOGEL PI IND STRL 8 (GLOVE) ×1 IMPLANT
GLOVE BIOGEL PI INDICATOR 8 (GLOVE) ×1
GLOVE ECLIPSE 8.0 STRL XLNG CF (GLOVE) ×4 IMPLANT
GLOVE SURG SS PI 6.5 STRL IVOR (GLOVE) ×4 IMPLANT
GOWN PREVENTION PLUS LG XLONG (DISPOSABLE) ×4 IMPLANT
GOWN STRL REIN XL XLG (GOWN DISPOSABLE) ×3 IMPLANT
HANDPIECE INTERPULSE COAX TIP (DISPOSABLE) ×2
IMMOBILIZER KNEE 20 (SOFTGOODS) ×4
IMMOBILIZER KNEE 20 THIGH 36 (SOFTGOODS) IMPLANT
KIT BASIN OR (CUSTOM PROCEDURE TRAY) ×2 IMPLANT
MANIFOLD NEPTUNE II (INSTRUMENTS) ×2 IMPLANT
NEEDLE HYPO 22GX1.5 SAFETY (NEEDLE) ×2 IMPLANT
NS IRRIG 1000ML POUR BTL (IV SOLUTION) ×1 IMPLANT
PACK TOTAL JOINT (CUSTOM PROCEDURE TRAY) ×2 IMPLANT
POSITIONER SURGICAL ARM (MISCELLANEOUS) ×2 IMPLANT
SET HNDPC FAN SPRY TIP SCT (DISPOSABLE) ×1 IMPLANT
SPONGE GAUZE 4X4 12PLY (GAUZE/BANDAGES/DRESSINGS) ×1 IMPLANT
SPONGE LAP 18X18 X RAY DECT (DISPOSABLE) ×2 IMPLANT
SPONGE SURGIFOAM ABS GEL 100 (HEMOSTASIS) ×2 IMPLANT
STAPLER VISISTAT 35W (STAPLE) IMPLANT
SUCTION FRAZIER 12FR DISP (SUCTIONS) ×2 IMPLANT
SUT BONE WAX W31G (SUTURE) ×2 IMPLANT
SUT MNCRL AB 4-0 PS2 18 (SUTURE) ×2 IMPLANT
SUT VIC AB 1 CT1 27 (SUTURE) ×4
SUT VIC AB 1 CT1 27XBRD ANTBC (SUTURE) ×2 IMPLANT
SUT VIC AB 2-0 CT1 27 (SUTURE) ×8
SUT VIC AB 2-0 CT1 TAPERPNT 27 (SUTURE) ×2 IMPLANT
SUT VLOC 180 0 24IN GS25 (SUTURE) ×2 IMPLANT
SYR 20CC LL (SYRINGE) ×2 IMPLANT
TOWEL OR 17X26 10 PK STRL BLUE (TOWEL DISPOSABLE) ×4 IMPLANT
TOWER CARTRIDGE SMART MIX (DISPOSABLE) ×2 IMPLANT
TRAY FOLEY CATH 14FRSI W/METER (CATHETERS) ×2 IMPLANT
WATER STERILE IRR 1500ML POUR (IV SOLUTION) ×3 IMPLANT
WRAP KNEE MAXI GEL POST OP (GAUZE/BANDAGES/DRESSINGS) ×3 IMPLANT

## 2012-07-26 NOTE — Transfer of Care (Signed)
Immediate Anesthesia Transfer of Care Note  Patient: [name]  Procedure(s) Performed: Procedure(s): RIGHT TOTAL KNEE ARTHROPLASTY (Right)  Patient Location: PACU  Anesthesia Type:General  Level of Consciousness: awake, sedated and patient cooperative  Airway & Oxygen Therapy: Patient Spontanous Breathing and Patient connected to face mask oxygen  Post-op Assessment: Report given to PACU RN and Post -op Vital signs reviewed and stable  Post vital signs: Reviewed and stable  Complications: No apparent anesthesia complications

## 2012-07-26 NOTE — Interval H&P Note (Signed)
History and Physical Interval Note:  07/26/2012 12:43 PM  [name]  has presented today for surgery, with the diagnosis of OSTEOARTHRITIS OF RIGHT KNEE  The various methods of treatment have been discussed with the patient and family. After consideration of risks, benefits and other options for treatment, the patient has consented to  Procedure(s): RIGHT TOTAL KNEE ARTHROPLASTY (Right) as a surgical intervention .  The patient's history has been reviewed, patient examined, no change in status, stable for surgery.  I have reviewed the patient's chart and labs.  Questions were answered to the patient's satisfaction.     Suhaylah Wampole A

## 2012-07-26 NOTE — Brief Op Note (Signed)
07/26/2012  2:53 PM  PATIENT:  [name]  79 y.o. male  PRE-OPERATIVE DIAGNOSIS:  OSTEOARTHRITIS OF RIGHT KNEE  POST-OPERATIVE DIAGNOSIS:  OSTEOARTHRITIS OF RIGHT KNEE  PROCEDURE:  Procedure(s): RIGHT TOTAL KNEE ARTHROPLASTY (Right)  SURGEON:  Surgeon(s) and Role:    * Jacki Cones, MD - Primary  PHYSICIAN ASSISTANT: Dimitri Ped PA  ASSISTANTS: Pharmacist, community PA}   ANESTHESIA:   general  EBL:  Total I/O In: 1000 [I.V.:1000] Out: 150 [Urine:150]  BLOOD ADMINISTERED:none  DRAINS: (one) Hemovact drain(s) in the Right Knee with  Suction Open   LOCAL MEDICATIONS USED:  BUPIVICAINE 20cc mixed with 20cc of Normal Saline  SPECIMEN:  No Specimen  DISPOSITION OF SPECIMEN:  N/A  COUNTS:  YES  TOURNIQUET:   Total Tourniquet Time Documented: Thigh (Right) - 90 minutes Total: Thigh (Right) - 90 minutes   DICTATION: .Other Dictation: Dictation Number [id number]  PLAN OF CARE: Admit to inpatient   PATIENT DISPOSITION:  Stable in OR   Delay start of Pharmacological VTE agent (>24hrs) due to surgical blood loss or risk of bleeding: yes

## 2012-07-26 NOTE — Anesthesia Preprocedure Evaluation (Addendum)
Anesthesia Evaluation  Patient identified by MRN, date of birth, ID band Patient awake    Reviewed: Allergy & Precautions, H&P , NPO status , Patient's Chart, lab work & pertinent test results  Airway Mallampati: II TM Distance: >3 FB Neck ROM: full    Dental  (+) Edentulous Upper, Edentulous Lower and Dental Advisory Given   Pulmonary neg pulmonary ROS, former smoker,  breath sounds clear to auscultation  Pulmonary exam normal       Cardiovascular Exercise Tolerance: Good hypertension, + Peripheral Vascular Disease (AAA, stenting) negative cardio ROS  Rhythm:regular Rate:Normal     Neuro/Psych negative neurological ROS  negative psych ROS   GI/Hepatic negative GI ROS, Neg liver ROS,   Endo/Other  negative endocrine ROSMorbid obesity  Renal/GU negative Renal ROS  negative genitourinary   Musculoskeletal negative musculoskeletal ROS (+)   Abdominal (+) + obese,   Peds negative pediatric ROS (+)  Hematology negative hematology ROS (+)   Anesthesia Other Findings   Reproductive/Obstetrics negative OB ROS                         Anesthesia Physical Anesthesia Plan  ASA: III  Anesthesia Plan: General   Post-op Pain Management:    Induction: Intravenous  Airway Management Planned: Oral ETT  Additional Equipment:   Intra-op Plan:   Post-operative Plan: Extubation in OR  Informed Consent: I have reviewed the patients History and Physical, chart, labs and discussed the procedure including the risks, benefits and alternatives for the proposed anesthesia with the patient or authorized representative who has indicated his/her understanding and acceptance.   Dental Advisory Given  Plan Discussed with: CRNA and Surgeon  Anesthesia Plan Comments:         Anesthesia Quick Evaluation

## 2012-07-27 ENCOUNTER — Encounter (HOSPITAL_COMMUNITY): Payer: Self-pay | Admitting: Orthopedic Surgery

## 2012-07-27 ENCOUNTER — Inpatient Hospital Stay (HOSPITAL_COMMUNITY): Payer: Medicare Other

## 2012-07-27 LAB — BASIC METABOLIC PANEL
Calcium: 8.8 mg/dL (ref 8.4–10.5)
GFR calc non Af Amer: 65 mL/min — ABNORMAL LOW (ref >90)
Glucose, Bld: 153 mg/dL — ABNORMAL HIGH (ref 70–99)
Sodium: 136 mEq/L (ref 135–145)

## 2012-07-27 LAB — CBC
Hemoglobin: 10.5 g/dL — ABNORMAL LOW (ref 13.0–17.0)
MCH: 26.9 pg (ref 26.0–34.0)
MCHC: 31.3 g/dL (ref 30.0–36.0)

## 2012-07-27 MED ORDER — HYDROMORPHONE HCL 2 MG PO TABS
2.0000 mg | ORAL_TABLET | ORAL | Status: DC | PRN
  Administered 2012-07-27 – 2012-07-29 (×10): 2 mg via ORAL
  Filled 2012-07-27 (×10): qty 1

## 2012-07-27 NOTE — Anesthesia Postprocedure Evaluation (Signed)
Anesthesia Post Note  Patient: [name]  Procedure(s) Performed: Procedure(s) (LRB): RIGHT TOTAL KNEE ARTHROPLASTY (Right)  Anesthesia type: General  Patient location: PACU  Post pain: Pain level controlled  Post assessment: Post-op Vital signs reviewed  Last Vitals:  Filed Vitals:   07/27/12 0640  BP: 106/73  Pulse: 95  Temp: 36.8 C  Resp: 16    Post vital signs: Reviewed  Level of consciousness: sedated  Complications: No apparent anesthesia complications

## 2012-07-27 NOTE — Progress Notes (Signed)
07/27/12 1600  PT Visit Information  Last PT Received On 07/27/12  Assistance Needed +2  PT Time Calculation  PT Start Time 1517  PT Stop Time 1542  PT Time Calculation (min) 25 min  Subjective Data  Subjective I just need to catch my breath. (after ambulation upon return to supine, prior to starting ther ex)  Precautions  Precautions Knee  Required Braces or Orthoses Knee Immobilizer - Right  Restrictions  RLE Weight Bearing WBAT  Cognition  Arousal/Alertness Awake/alert  Behavior During Therapy WFL for tasks assessed/performed  Overall Cognitive Status Within Functional Limits for tasks assessed  Bed Mobility  Bed Mobility Supine to Sit;Sit to Supine  Supine to Sit 1: +2 Total assist  Supine to Sit: Patient Percentage 60%  Sit to Supine 3: Mod assist  Details for Bed Mobility Assistance assist for R LE and trunk upon upright and assist for bil LEs onto bed  Transfers  Transfers Sit to Stand;Stand to Sit  Sit to Stand 1: +2 Total assist;With upper extremity assist;From bed  Sit to Stand: Patient Percentage 70%  Stand to Sit 1: +2 Total assist;To bed  Stand to Sit: Patient Percentage 70%  Details for Transfer Assistance verbal cues for safe technique including R LE forward and hand placement  Ambulation/Gait  Ambulation/Gait Assistance 1: +2 Total assist  Ambulation/Gait: Patient Percentage 70%  Ambulation Distance (Feet) 40 Feet  Assistive device Rolling walker  Ambulation/Gait Assistance Details pt able to tolerate ambulating into hallway and back to room this visit, slight SOB upon return to bed, remained on 3L O2 [place] throughout session, no dizziness, verbal cues for sequence and RW distance  Gait Pattern Step-to pattern;Decreased step length - right;Trunk flexed  Gait velocity decreased  Exercises  Exercises Total Joint  Total Joint Exercises  Ankle Circles/Pumps AROM;Both;15 reps  Quad Sets AROM;Right;15 reps  Short Arc [employer];Right;15 reps  Heel Slides  AAROM;Right;15 reps  Hip ABduction/ADduction AROM;Right;15 reps  Straight Leg Raises AAROM;Right;15 reps  Goniometric ROM R knee -8-35*  PT - End of Session  Equipment Utilized During Treatment Oxygen;Right knee immobilizer  Activity Tolerance Patient limited by fatigue;Patient limited by pain  Patient left in bed;with call bell/phone within reach  PT - Assessment/Plan  Comments on Treatment Session Pt premedicated for session.  Pt ambulated short distance in hallway and performed exercises with multiple verbal and manual cues for neutral hip (knee cap toward ceiling) as pt tends to rest in external rotation and reports toe out ambulation due to knee prior to surgery.  PT Plan Discharge plan remains appropriate;Frequency remains appropriate  Follow Up Recommendations SNF  PT equipment None recommended by PT  Acute Rehab PT Goals  PT Goal: Supine/Side to Sit - Progress Progressing toward goal  PT Goal: Sit to Supine/Side - Progress Progressing toward goal  PT Goal: Sit to Stand - Progress Progressing toward goal  PT Goal: Stand to Sit - Progress Progressing toward goal  PT Goal: Ambulate - Progress Progressing toward goal  PT Goal: Perform Home Exercise Program - Progress Progressing toward goal  PT General Charges  $$ ACUTE PT VISIT 1 Procedure  PT Treatments  $Gait Training 8-22 mins  $Therapeutic Exercise 8-22 mins   Zenovia Jarred, PT, DPT 07/27/2012 Pager: [phone]

## 2012-07-27 NOTE — Evaluation (Signed)
Physical Therapy Evaluation Patient Details Name: [name] MRN: [id number] DOB: January 03, 1930 Today's Date: 07/27/2012 Time: 4098-1191 PT Time Calculation (min): 14 min  PT Assessment / Plan / Recommendation Clinical Impression  Pt is an 79 year old male s/p R TKR.  Pt would benefit from acute PT services in order to improve indepedence with tranfers and ambulation by increasing R LE strength and ROM to prepare for d/c to SNF.    PT Assessment  Patient needs continued PT services    Follow Up Recommendations  SNF    Does the patient have the potential to tolerate intense rehabilitation      Barriers to Discharge        Equipment Recommendations  None recommended by PT    Recommendations for Other Services     Frequency 7X/week    Precautions / Restrictions Precautions Precautions: Knee Required Braces or Orthoses: Knee Immobilizer - Right Restrictions Weight Bearing Restrictions: No RLE Weight Bearing: Weight bearing as tolerated   Pertinent Vitals/Pain Pt reports minimal pain at rest.  Ice pack applied upon sitting in recliner      Mobility  Bed Mobility Bed Mobility: Supine to Sit Supine to Sit: 1: +2 Total assist Supine to Sit: Patient Percentage: 50% Details for Bed Mobility Assistance: verbal cues for technique, assist for R LE and trunk Transfers Transfers: Sit to Stand;Stand to Sit Sit to Stand: 1: +2 Total assist;From bed;With upper extremity assist Sit to Stand: Patient Percentage: 60% Stand to Sit: 1: +2 Total assist;To chair/3-in-1;With upper extremity assist Stand to Sit: Patient Percentage: 60% Details for Transfer Assistance: verbal cues for safe technique including R LE forward and hand placement Ambulation/Gait Ambulation/Gait Assistance: 1: +2 Total assist Ambulation/Gait: Patient Percentage: 60% Ambulation Distance (Feet): 20 Feet Assistive device: Rolling walker Ambulation/Gait Assistance Details: verbal cues for sequence, step length,  RW distance, pt became dizzy so recliner brought behind pt Gait Pattern: Step-to pattern;Decreased step length - right;Trunk flexed Gait velocity: decreased General Gait Details: ambulated on 4L O2 since on 3.5 L in room via     Exercises     PT Diagnosis: Difficulty walking;Acute pain  PT Problem List: Decreased strength;Decreased range of motion;Decreased activity tolerance;Decreased mobility;Decreased safety awareness;Decreased knowledge of use of DME;Pain;Decreased knowledge of precautions PT Treatment Interventions: DME instruction;Gait training;Functional mobility training;Therapeutic activities;Therapeutic exercise;Patient/family education   PT Goals Acute Rehab PT Goals PT Goal Formulation: With patient Time For Goal Achievement: 08/03/12 Potential to Achieve Goals: Good Pt will go Supine/Side to Sit: with supervision PT Goal: Supine/Side to Sit - Progress: Goal set today Pt will go Sit to Supine/Side: with supervision PT Goal: Sit to Supine/Side - Progress: Goal set today Pt will go Sit to Stand: with supervision PT Goal: Sit to Stand - Progress: Goal set today Pt will go Stand to Sit: with supervision PT Goal: Stand to Sit - Progress: Goal set today Pt will Ambulate: 51 - 150 feet;with supervision;with least restrictive assistive device PT Goal: Ambulate - Progress: Progressing toward goal Pt will Perform Home Exercise Program: with supervision, verbal cues required/provided PT Goal: Perform Home Exercise Program - Progress: Goal set today  Visit Information  Last PT Received On: 07/27/12 Assistance Needed: +2    Subjective Data  Subjective: I believe you better get the chair.  (dizzy with ambulation)   Prior Functioning  Home Living Available Help at Discharge: Skilled Nursing Facility Home Adaptive Equipment: Walker - rolling Additional Comments: Plans to d/c to SNF. Prior Function Level of Independence: Independent  Communication Communication: HOH     Cognition  Cognition Arousal/Alertness: Awake/alert Behavior During Therapy: WFL for tasks assessed/performed Overall Cognitive Status: Within Functional Limits for tasks assessed    Extremity/Trunk Assessment Right Upper Extremity Assessment RUE ROM/Strength/Tone: WFL for tasks assessed Left Upper Extremity Assessment LUE ROM/Strength/Tone: WFL for tasks assessed Right Lower Extremity Assessment RLE ROM/Strength/Tone: Deficits RLE ROM/Strength/Tone Deficits: R knee AAROM -7-35* supine limited by pain, fair quad set, unable to perform SLR Left Lower Extremity Assessment LLE ROM/Strength/Tone: [facility] for tasks assessed   Balance    End of Session PT - End of Session Equipment Utilized During Treatment: Oxygen;Right knee immobilizer Activity Tolerance: Patient limited by fatigue;Patient limited by pain Patient left: in chair;with call bell/phone within reach;with family/visitor present  GP     [name],KATHrine E 07/27/2012, 11:04 AM Zenovia Jarred, PT, DPT 07/27/2012 Pager: [phone]

## 2012-07-27 NOTE — Progress Notes (Signed)
   Subjective: 1 Day Post-Op Procedure(s) (LRB): RIGHT TOTAL KNEE ARTHROPLASTY (Right) Patient reports pain as mild.   Patient seen in rounds with Dr. Darrelyn Hillock. Patient is feeling well but is having some issues with mantaining O2 sats without rebreather mask. he denies chest pain or shortness of breath. He reports that his knee is feeling better this morning than it did last night.  We will start therapy today.  Plan is to go Skilled nursing facility after hospital stay.  Objective: Vital signs in last 24 hours: Temp:  [97.5 F (36.4 C)-98.5 F (36.9 C)] 98.2 F (36.8 C) (05/15 0640) Pulse Rate:  [73-99] 95 (05/15 0640) Resp:  [14-18] 16 (05/15 0640) BP: (106-168)/(73-110) 106/73 mmHg (05/15 0640) SpO2:  [90 %-98 %] 91 % (05/15 0640) Weight:  [126.554 kg (279 lb)] 126.554 kg (279 lb) (05/14 1645)  Intake/Output from previous day:  Intake/Output Summary (Last 24 hours) at 07/27/12 0715 Last data filed at 07/27/12 0641  Gross per 24 hour  Intake 3273.33 ml  Output    985 ml  Net 2288.33 ml    Intake/Output this shift:    Labs:  Recent Labs  07/27/12 0355  HGB 10.5*    Recent Labs  07/27/12 0355  WBC 9.1  RBC 3.90*  HCT 33.6*  PLT 95*    Recent Labs  07/27/12 0355  NA 136  K 5.0  CL 102  CO2 28  BUN 20  CREATININE 1.05  GLUCOSE 153*  CALCIUM 8.8   No results found for this basename: LABPT, INR,  in the last 72 hours  EXAM General - Patient is Alert and Oriented Extremity - Neurologically intact Neurovascular intact Dorsiflexion/Plantar flexion intact Incision: moderate drainage No cellulitis present Compartment soft Dressing - moderate bloody drainage Motor Function - intact, moving foot and toes well on exam.  Hemovac pulled without difficulty.  Past Medical History  Diagnosis Date  . Hypertension   . BPH (benign prostatic hyperplasia)   . Hyperlipidemia   . Cancer 2008-2009    prostate TREATED WITH RADIATION  . AAA (abdominal aortic  aneurysm) 04/2012    STENTING OF AAA IN [place]    Assessment/Plan: 1 Day Post-Op Procedure(s) (LRB): RIGHT TOTAL KNEE ARTHROPLASTY (Right) Active Problems:   Osteoarthritis of right knee  Estimated body mass index is 37.83 kg/(m^2) as calculated from the following:   Height as of this encounter: 6' (1.829 m).   Weight as of this encounter: 126.554 kg (279 lb). Advance diet Up with therapy Discharge to SNF tentative Saturday  DVT Prophylaxis - Xarelto Weight-Bearing as tolerated   Will try on nasal cannula this morning. Will get CXR to evaluate cause for decreased sats. Recheck labs in the morning. Start therapy today. DC foley.   [name] LAUREN 07/27/2012, 7:15 AM

## 2012-07-27 NOTE — Op Note (Signed)
NAMEMarland Kitchen  AUDREY, ELLER NO.:  [id number]  MEDICAL RECORD NO.:  [id number]  LOCATION:  1612                         FACILITY:  [facility]  PHYSICIAN:  Georges Lynch. Jayzon Taras, M.D.DATE OF BIRTH:  08/26/1929  DATE OF PROCEDURE:  07/26/2012 DATE OF DISCHARGE:                              OPERATIVE REPORT   SURGEON:  Georges Lynch. Darrelyn Hillock, M.D.  ASSISTANT:  Dimitri Ped, [place]  PREOPERATIVE DIAGNOSIS: 1. Severe degenerative arthritis of the right knee with a complex genu     varus. 2. Flexion contracture of right knee.  POSTOPERATIVE DIAGNOSIS: 1. Severe degenerative arthritis of the right knee with a complex genu     varus. 2. Flexion contracture of right knee.  OPERATION: 1. Right total knee arthroplasty utilizing DePuy system. Sizes used     was a size 5 right femur posterior cruciate sacrificing. Tibial     tray was a size 5. The insert was a size 5, 15 mm thickness.  The     patella was a size 38 patella with 3 pegs.  All 3 components were     cemented. 2. Medial release for a severe genu varus with bone on bone.  PROCEDURE IN DETAIL:  Under general anesthesia, routine orthopedic prep and draping of the right lower extremity was carried out.  The patient had 900 mg of clindamycin.  At this time, the appropriate time-out was carried out first.  I also marked the appropriate right leg in the holding area.  Following that, after sterile prep and draping, the leg was exsanguinated first with an Esmarch.  Tourniquet was elevated at 350 mmHg.  At this time, the knee was flexed.  An incision was made over the anterior aspect of the right knee.  Bleeders were identified and cauterized.  Two flaps were created.  I then carried out a median parapatellar incision.  Patella was reflected laterally.  The knee was flexed, and I did medial lateral meniscectomies.  I excised the anterior and posterior cruciate ligaments.  I then made my initial drill hole in the intercondylar notch,  removed 12 mm thickness off the distal femur because of the severe contractures and severe genu varus.  Following that, I then measured the femur to be a size 5.  I then cut the distal femur for a size 5 right femur.  After the femoral cuts were made, I then directed the attention to the tibia.  I had to do further posterior medial release because of the contracture.  We thoroughly debrided out the area.  We then made initial soft cut to even out the surface.  We measured the tibial plateau for a size 5 tray.  At this time, the initial drill hole was made in the tibial plateau.  We then inserted our intramedullary guide after we thoroughly irrigated out the tibial canal. We then removed 6 mm thickness off the affected medial side of the tibia and removed all the spurs.  We then inserted our lamina spreaders, did our soft tissue debridement.  She had a large massive soft tissue in the posterior medial corner, we removed.  Following that, we inserted our spacer guides and had excellent function with  a size 15 mm thickness spacer guide to flexion and extension.  We had good medial lateral tension as well.  We then removed the guide and then continued our cuts with the tibial plateau.  We cut our keel cut in the usual fashion.  We then cut our notch cut.  Cut out the distal femur.  We inserted our trial components through range of motion.  I first tried a 12.5 mm thickness.  It was too loose.  I then went to 15, which fit very nicely, pars flexion and extension and the medial lateral stability.  With the trials in place, I then did a resurfacing procedure on the patella for a size 38 patella.  Three drill holes were made in the patella as well at that time.  The insert removed all trial components, thoroughly water picked out the knee and cemented all 3 components in simultaneously. Once the cement was hardened, we removed all the loose pieces of cement. We irrigated out the knee.  I injected  my first portion of the mixture of 20 mL of Exparel and 20 mL of normal saline.  I then inserted my rotating platform for 15 mm thickness, size 5 reduced the knee and had excellent motion.  We then inserted our drain, Hemovac drain and closed the knee in the layers in the usual fashion.          ______________________________ Georges Lynch Darrelyn Hillock, M.D.     RAG/MEDQ  D:  07/26/2012  T:  07/27/2012  Job:  [id number]

## 2012-07-27 NOTE — Evaluation (Signed)
Occupational Therapy Evaluation Patient Details Name: [name] MRN: [id number] DOB: 08-Nov-1929 Today's Date: 07/27/2012 Time: 4098-1191 OT Time Calculation (min): 20 min  OT Assessment / Plan / Recommendation Clinical Impression  Pt is s/p R TKA and displays increased pain which is impacting his functional mobility and ADL. He will benefit from skilled OT services to improve strength and independence with self care tasks for next venue of care.     OT Assessment  Patient needs continued OT Services    Follow Up Recommendations  SNF;Supervision/Assistance - 24 hour    Barriers to Discharge      Equipment Recommendations  3 in 1 bedside comode    Recommendations for Other Services    Frequency  Min 2X/week    Precautions / Restrictions Precautions Precautions: Knee Required Braces or Orthoses: Knee Immobilizer - Right Restrictions Weight Bearing Restrictions: No RLE Weight Bearing: Weight bearing as tolerated        ADL  Eating/Feeding: Simulated;Independent Where Assessed - Eating/Feeding: Chair Grooming: Simulated;Set up Where Assessed - Grooming: Supported sitting Upper Body Bathing: Simulated;Chest;Right arm;Left arm;Abdomen;Set up;Supervision/safety Where Assessed - Upper Body Bathing: Unsupported sitting Lower Body Bathing: Simulated;+2 Total assistance Lower Body Bathing: Patient Percentage: 40% Where Assessed - Lower Body Bathing: Supported sit to stand Upper Body Dressing: Simulated;Supervision/safety;Set up Where Assessed - Upper Body Dressing: Unsupported sitting Lower Body Dressing: Simulated;+2 Total assistance (10/10 pain right now) Lower Body Dressing: Patient Percentage: 20% Where Assessed - Lower Body Dressing: Supported sit to stand Toilet Transfer: Simulated;+2 Total assistance Toilet Transfer: Patient Percentage: 60% Statistician Method: Stand pivot Toileting - Architect and Hygiene: Simulated;+2 Total assistance Toileting -  Architect and Hygiene: Patient Percentage: 40% Where Assessed - Engineer, mining and Hygiene: Standing Equipment Used: Rolling walker ADL Comments: Discussed AE use PRN versus reaching to feet to don socks, etc. Pt planning SNF.    OT Diagnosis: Generalized weakness;Acute pain  OT Problem List: Decreased strength OT Treatment Interventions: Self-care/ADL training;Therapeutic activities;Patient/family education;DME and/or AE instruction   OT Goals Acute Rehab OT Goals OT Goal Formulation: With patient/family Time For Goal Achievement: 08/03/12 Potential to Achieve Goals: Good ADL Goals Pt Will Perform Grooming: with min assist;Standing at sink ADL Goal: Grooming - Progress: Goal set today Pt Will Transfer to Toilet: with min assist;Ambulation;3-in-1;with DME ADL Goal: Toilet Transfer - Progress: Goal set today Pt Will Perform Toileting - Clothing Manipulation: with min assist;Standing ADL Goal: Toileting - Clothing Manipulation - Progress: Goal set today  Visit Information  Last OT Received On: 07/27/12 Assistance Needed: +2    Subjective Data  Subjective: I am hurting Patient Stated Goal: to go back to bed   Prior Functioning     Home Living Available Help at Discharge: Skilled Nursing Facility Home Adaptive Equipment: Walker - rolling Additional Comments: Plans to d/c to SNF. Prior Function Level of Independence: Independent with assistive device(s) (pt states he used a cane PTA) Communication Communication: HOH         Vision/Perception     Cognition  Cognition Arousal/Alertness: Awake/alert Behavior During Therapy: WFL for tasks assessed/performed Overall Cognitive Status: Within Functional Limits for tasks assessed    Extremity/Trunk Assessment Right Upper Extremity Assessment RUE ROM/Strength/Tone: WFL for tasks assessed Left Upper Extremity Assessment LUE ROM/Strength/Tone: WFL for tasks assessed Right Lower Extremity  Assessment RLE ROM/Strength/Tone: Deficits RLE ROM/Strength/Tone Deficits: R knee AAROM -7-35* supine limited by pain, fair quad set, unable to perform SLR Left Lower Extremity Assessment LLE ROM/Strength/Tone: [facility] for tasks  assessed     Mobility Bed Mobility Bed Mobility: Sit to Supine Supine to Sit: 1: +2 Total assist Supine to Sit: Patient Percentage: 50% Sit to Supine: 3: Mod assist;HOB flat;With rail Details for Bed Mobility Assistance: used rails to self assist and slide further up in bed. Needed assist for R LE onto bed.  Transfers Transfers: Sit to Stand;Stand to Sit Sit to Stand: 1: +2 Total assist;With upper extremity assist;From chair/3-in-1 Sit to Stand: Patient Percentage: 60% Stand to Sit: 1: +2 Total assist;To bed Stand to Sit: Patient Percentage: 60% Details for Transfer Assistance: verbal cues for hand placement and R LE management     Exercise     Balance     End of Session OT - End of Session Equipment Utilized During Treatment: Gait belt Activity Tolerance: Patient limited by pain Patient left: in bed;with call bell/phone within reach;with family/visitor present  GO     Lennox Laity [phone] 07/27/2012, 11:29 AM

## 2012-07-27 NOTE — Progress Notes (Signed)
Utilization review completed.  

## 2012-07-27 NOTE — Progress Notes (Signed)
Clinical Social Work Department CLINICAL SOCIAL WORK PLACEMENT NOTE 07/27/2012  Patient:  [name], [name]  Account Number:  [id number] Admit date:  07/26/2012  Clinical Social Worker:  Cori Razor, LCSW  Date/time:  07/27/2012 01:00 PM  Clinical Social Work is seeking post-discharge placement for this patient at the following level of care:   SKILLED NURSING   (*CSW will update this form in Epic as items are completed)   07/27/2012  Patient/family provided with [facility] Department of Clinical Social Work's list of facilities offering this level of care within the geographic area requested by the patient (or if unable, by the patient's family).  07/27/2012  Patient/family informed of their freedom to choose among providers that offer the needed level of care, that participate in Medicare, Medicaid or managed care program needed by the patient, have an available bed and are willing to accept the patient.  07/27/2012  Patient/family informed of [facility]' ownership interest in [facility], as well as of the fact that they are under no obligation to receive care at this facility.  PASARR submitted to EDS on 07/26/2012 PASARR number received from EDS on 07/26/2012  FL2 transmitted to all facilities in geographic area requested by pt/family on  07/27/2012 FL2 transmitted to all facilities within larger geographic area on   Patient informed that his/her managed care company has contracts with or will negotiate with  certain facilities, including the following:     Patient/family informed of bed offers received:   Patient chooses bed at  Physician recommends and patient chooses bed at    Patient to be transferred to  on   Patient to be transferred to facility by   The following physician request were entered in Epic:   Additional Comments:  Cori Razor LCSW [phone]

## 2012-07-27 NOTE — Care Management Note (Signed)
    Page 1 of 1   07/27/2012     5:07:27 PM   CARE MANAGEMENT NOTE 07/27/2012  Patient:  [name], [name]   Account Number:  [id number]  Date Initiated:  07/27/2012  Documentation initiated by:  Colleen Can  Subjective/Objective Assessment:   dx Osteoarthritis rt knee; total knee replacemnt     Action/Plan:   SNF rehab  CM will follow as needed.   Anticipated DC Date:  07/29/2012   Anticipated DC Plan:  SKILLED NURSING FACILITY  In-house referral  Clinical Social Worker      DC Planning Services  CM consult      Choice offered to / List presented to:             Status of service:  Completed, signed off Medicare Important Message given?  NA - LOS <3 / Initial given by admissions (If response is "NO", the following Medicare IM given date fields will be blank) Date Medicare IM given:   Date Additional Medicare IM given:    Discharge Disposition:    Per UR Regulation:    If discussed at Long Length of Stay Meetings, dates discussed:    Comments:  05

## 2012-07-27 NOTE — Progress Notes (Signed)
Clinical Social Work Department BRIEF PSYCHOSOCIAL ASSESSMENT 07/27/2012  Patient:  [name], [name]     Account Number:  [id number]     Admit date:  07/26/2012  Clinical Social Worker:  Candie Chroman  Date/Time:  07/27/2012 12:49 PM  Referred by:  Physician  Date Referred:  07/27/2012 Referred for  SNF Placement   Other Referral:   Interview type:  Patient Other interview type:    PSYCHOSOCIAL DATA Living Status:  WIFE Admitted from facility:   Level of care:   Primary support name:  [name] Primary support relationship to patient:  SPOUSE Degree of support available:   supportive    CURRENT CONCERNS Current Concerns  Post-Acute Placement   Other Concerns:    SOCIAL WORK ASSESSMENT / PLAN Pt is an 79 yr old gentleman living at home prior to hospitalization. CSW met with pt / daughter to assist with d/c planning. ST Rehab is needed following hospital d/c. Pt would like to have rehab at [facility]. SNF has been contacted and decision is pending. SNF search has been initiated in [facility] and all bed offers will be provided as received.   Assessment/plan status:  Psychosocial Support/Ongoing Assessment of Needs Other assessment/ plan:   Information/referral to community resources:   SNF list provided.    PATIENT'S/FAMILY'S RESPONSE TO PLAN OF CARE: Pt feels rehab would be helpful at d/c. Family supports this decision. Pt is hoping for placement at the [facility].    Elijah Razor LCSW [phone]

## 2012-07-28 DIAGNOSIS — D62 Acute posthemorrhagic anemia: Secondary | ICD-10-CM | POA: Diagnosis not present

## 2012-07-28 LAB — BASIC METABOLIC PANEL
CO2: 28 mEq/L (ref 19–32)
Calcium: 8.5 mg/dL (ref 8.4–10.5)
GFR calc non Af Amer: 57 mL/min — ABNORMAL LOW (ref >90)
Sodium: 131 mEq/L — ABNORMAL LOW (ref 135–145)

## 2012-07-28 LAB — CBC
MCH: 27.5 pg (ref 26.0–34.0)
MCHC: 32.4 g/dL (ref 30.0–36.0)
Platelets: 81 10*3/uL — ABNORMAL LOW (ref 150–400)
RBC: 3.09 MIL/uL — ABNORMAL LOW (ref 4.22–5.81)

## 2012-07-28 LAB — HEMOGLOBIN AND HEMATOCRIT, BLOOD
HCT: 26.3 % — ABNORMAL LOW (ref 39.0–52.0)
Hemoglobin: 8.6 g/dL — ABNORMAL LOW (ref 13.0–17.0)

## 2012-07-28 MED ORDER — OXYCODONE HCL 5 MG PO TABS: 5.0000 mg | ORAL_TABLET | ORAL | Status: DC | PRN

## 2012-07-28 MED ORDER — POLYETHYLENE GLYCOL 3350 17 G PO PACK: 17.0000 g | PACK | Freq: Every day | ORAL | Status: DC | PRN

## 2012-07-28 MED ORDER — RIVAROXABAN 10 MG PO TABS: 10.0000 mg | ORAL_TABLET | Freq: Every day | ORAL | Status: DC

## 2012-07-28 MED ORDER — METHOCARBAMOL 500 MG PO TABS: 500.0000 mg | ORAL_TABLET | Freq: Four times a day (QID) | ORAL | Status: DC | PRN

## 2012-07-28 MED ORDER — BISACODYL 10 MG RE SUPP: 10.0000 mg | Freq: Every day | RECTAL | Status: DC | PRN

## 2012-07-28 MED ORDER — FERROUS SULFATE 325 (65 FE) MG PO TABS: 325.0000 mg | ORAL_TABLET | Freq: Three times a day (TID) | ORAL | Status: DC

## 2012-07-28 MED ORDER — SODIUM CHLORIDE 0.9 % IV BOLUS (SEPSIS)
500.0000 mL | Freq: Once | INTRAVENOUS | Status: AC
  Administered 2012-07-28: 500 mL via INTRAVENOUS

## 2012-07-28 NOTE — Progress Notes (Signed)
Physical Therapy Treatment Note   07/28/12 1600  PT Visit Information  Last PT Received On 07/28/12  Assistance Needed +2  PT Time Calculation  PT Start Time 1424  PT Stop Time 1437  PT Time Calculation (min) 13 min  Subjective Data  Subjective Every time I try to sleep someone comes in.  Precautions  Precautions Knee  Required Braces or Orthoses Knee Immobilizer - Right  Restrictions  RLE Weight Bearing WBAT  Cognition  Arousal/Alertness Awake/alert  Behavior During Therapy WFL for tasks assessed/performed  Overall Cognitive Status Within Functional Limits for tasks assessed  Bed Mobility  Bed Mobility Supine to Sit;Sit to Supine  Supine to Sit 3: Mod assist  Sit to Supine 3: Mod assist  Details for Bed Mobility Assistance assist for R LE and trunk upon upright and assist for bil LEs onto bed  Transfers  Transfers Sit to Stand;Stand to Sit  Sit to Stand 1: +2 Total assist;With upper extremity assist;From bed  Sit to Stand: Patient Percentage 70%  Stand to Sit 1: +2 Total assist;To bed;With upper extremity assist  Stand to Sit: Patient Percentage 70%  Details for Transfer Assistance verbal cues for safe technique including R LE forward and hand placement  Ambulation/Gait  Ambulation/Gait Assistance 1: +2 Total assist  Ambulation/Gait: Patient Percentage 80%  Ambulation Distance (Feet) 40 Feet  Assistive device Rolling walker  Ambulation/Gait Assistance Details verbal cues for step length and keeping R LE within RW, pt denies lightheadness, maintained 2L O2 [place]  Gait Pattern Step-to pattern;Decreased step length - right;Trunk flexed  Gait velocity decreased  PT - Assessment/Plan  Comments on Treatment Session Pt ambulated in hallway again this afternoon and assisted back to bed.  Pt reports d/c to SNF tomorrow.  PT Plan Discharge plan remains appropriate;Frequency remains appropriate  Follow Up Recommendations SNF  PT equipment None recommended by PT  Acute Rehab PT Goals   PT Goal: Supine/Side to Sit - Progress Progressing toward goal  PT Goal: Sit to Supine/Side - Progress Progressing toward goal  PT Goal: Sit to Stand - Progress Progressing toward goal  PT Goal: Stand to Sit - Progress Progressing toward goal  PT Goal: Ambulate - Progress Progressing toward goal  PT General Charges  $$ ACUTE PT VISIT 1 Procedure  PT Treatments  $Gait Training 8-22 mins   Zenovia Jarred, PT, DPT 07/28/2012 Pager: [phone]

## 2012-07-28 NOTE — Progress Notes (Signed)
   Subjective: 2 Days Post-Op Procedure(s) (LRB): RIGHT TOTAL KNEE ARTHROPLASTY (Right) Patient reports pain as mild.   Patient seen in rounds without Dr. Darrelyn Hillock. Patient is well, and has had no acute complaints or problems. He reports that he slept better last night than he has in weeks. No complaints of chest pain or shortness of breath. Reports that although uncomfortable he felt positive about therapy yesterday.  Plan is to go Skilled nursing facility after hospital stay.  Objective: Vital signs in last 24 hours: Temp:  [97.9 F (36.6 C)-98.7 F (37.1 C)] 98 F (36.7 C) (05/16 0624) Pulse Rate:  [97-105] 97 (05/16 0624) Resp:  [16] 16 (05/16 0624) BP: (103-132)/(64-72) 103/72 mmHg (05/16 0624) SpO2:  [95 %-96 %] 95 % (05/16 0624)  Intake/Output from previous day:  Intake/Output Summary (Last 24 hours) at 07/28/12 0736 Last data filed at 07/28/12 0329  Gross per 24 hour  Intake    720 ml  Output    675 ml  Net     45 ml    Intake/Output this shift:    Labs:  Recent Labs  07/27/12 0355 07/28/12 0409  HGB 10.5* 8.5*    Recent Labs  07/27/12 0355 07/28/12 0409  WBC 9.1 8.7  RBC 3.90* 3.09*  HCT 33.6* 26.2*  PLT 95* 81*    Recent Labs  07/27/12 0355 07/28/12 0409  NA 136 131*  K 5.0 4.3  CL 102 98  CO2 28 28  BUN 20 25*  CREATININE 1.05 1.17  GLUCOSE 153* 129*  CALCIUM 8.8 8.5    EXAM General - Patient is Alert and Oriented Extremity - Neurologically intact Intact pulses distally Dorsiflexion/Plantar flexion intact No cellulitis present Compartment soft Dressing/Incision -minimal frank blood drainage. Dressing changed Motor Function - intact, moving foot and toes well on exam.   Past Medical History  Diagnosis Date  . Hypertension   . BPH (benign prostatic hyperplasia)   . Hyperlipidemia   . Cancer 2008-2009    prostate TREATED WITH RADIATION  . AAA (abdominal aortic aneurysm) 04/2012    STENTING OF AAA IN [place]     Assessment/Plan: 2 Days Post-Op Procedure(s) (LRB): RIGHT TOTAL KNEE ARTHROPLASTY (Right) Active Problems:   Osteoarthritis of right knee Acute blood los anemia  Estimated body mass index is 37.83 kg/(m^2) as calculated from the following:   Height as of this encounter: 6' (1.829 m).   Weight as of this encounter: 126.554 kg (279 lb). Advance diet Up with therapy D/C IV fluids Discharge to SNF tomorrow  DVT Prophylaxis - Xarelto Weight-Bearing as tolerated  Continue to monitor dressing today. Reinforce as needed. Continue PT. Will recheck Hgb this afternoon. Possible transfusion if too low and/or patient is symptomatic.   Labrina Lines LAUREN 07/28/2012, 7:36 AM

## 2012-07-28 NOTE — Progress Notes (Signed)
Physical Therapy Treatment Patient Details Name: [name] MRN: [id number] DOB: [birth year] Today's Date: 07/28/2012 Time: 4098-1191 PT Time Calculation (min): 25 min  PT Assessment / Plan / Recommendation Comments on Treatment Session  Pt ambulated short distance in hallway and performed exercises in supine.  Continues to need cues to perform neutral hip vs increased external rotation with R LE exercises. May require oxygen upon d/c due to desats on room air.    Follow Up Recommendations  SNF     Does the patient have the potential to tolerate intense rehabilitation     Barriers to Discharge        Equipment Recommendations  None recommended by PT    Recommendations for Other Services    Frequency     Plan Discharge plan remains appropriate;Frequency remains appropriate    Precautions / Restrictions Precautions Precautions: Knee Required Braces or Orthoses: Knee Immobilizer - Right Restrictions Weight Bearing Restrictions: No RLE Weight Bearing: Weight bearing as tolerated   Pertinent Vitals/Pain Pt reports minimal pain.  Premedicated and ice applied to knee after session.   SATURATION QUALIFICATIONS: (This note is used to comply with regulatory documentation for home oxygen)  Patient Saturations on Room Air at Rest = 86%  2L O2 [place] applied for ambulation.  Patient Saturations on 2 Liters of oxygen while Ambulating = 95%  Please briefly explain why patient needs home oxygen: to maintain sats above 88% at rest and during physical activity   Mobility  Bed Mobility Bed Mobility: Supine to Sit;Sit to Supine Supine to Sit: 1: +2 Total assist Supine to Sit: Patient Percentage: 70% Sit to Supine: 3: Mod assist Details for Bed Mobility Assistance: assist for R LE and trunk upon upright and assist for bil LEs onto bed Transfers Transfers: Sit to Stand;Stand to Sit Sit to Stand: 1: +2 Total assist;With upper extremity assist;From bed Sit to Stand: Patient Percentage:  70% Stand to Sit: 1: +2 Total assist;To bed Stand to Sit: Patient Percentage: 70% Details for Transfer Assistance: verbal cues for safe technique including R LE forward and hand placement Ambulation/Gait Ambulation/Gait Assistance: 1: +2 Total assist Ambulation/Gait: Patient Percentage: 80% Ambulation Distance (Feet): 40 Feet Assistive device: Rolling walker Ambulation/Gait Assistance Details: verbal cues for sequence and step length, ambulated on 2L O2 [place], pt reports slight dizziness today but "not as bad as yesterday" SaO2 95% on 2L and HR 127 during ambulation Gait Pattern: Step-to pattern;Decreased step length - right;Trunk flexed Gait velocity: decreased    Exercises Total Joint Exercises Ankle Circles/Pumps: AROM;Both;15 reps Quad Sets: AROM;Right;15 reps Gluteal Sets: AROM;Both;10 reps Short Arc QuadBarbaraann Boys;Right;15 reps Heel Slides: AAROM;Right;15 reps Hip ABduction/ADduction: Right;15 reps;AAROM Straight Leg Raises: AAROM;Right;15 reps Goniometric ROM: R knee -8-35* supine   PT Diagnosis:    PT Problem List:   PT Treatment Interventions:     PT Goals Acute Rehab PT Goals PT Goal: Supine/Side to Sit - Progress: Progressing toward goal PT Goal: Sit to Supine/Side - Progress: Progressing toward goal PT Goal: Sit to Stand - Progress: Progressing toward goal PT Goal: Stand to Sit - Progress: Progressing toward goal PT Goal: Ambulate - Progress: Progressing toward goal PT Goal: Perform Home Exercise Program - Progress: Progressing toward goal  Visit Information  Last PT Received On: 07/28/12 Assistance Needed: +2    Subjective Data  Subjective: It feels better than yesterday.   Cognition  Cognition Arousal/Alertness: Awake/alert Behavior During Therapy: WFL for tasks assessed/performed Overall Cognitive Status: Within Functional Limits for tasks assessed  Balance     End of Session PT - End of Session Equipment Utilized During Treatment: Oxygen;Right knee  immobilizer Activity Tolerance: Patient limited by fatigue;Patient limited by pain Patient left: in bed;with call bell/phone within reach   GP     [name],[name] 07/28/2012, 11:13 AM [name], PT, DPT 07/28/2012 Pager: [phone]

## 2012-07-28 NOTE — Progress Notes (Signed)
CSW assisting with d/c planning. Tentative D/C Summary sent to [facility] for possible d/c tomorrow. Weekend CSW will assist with d/c planning to SNF. Pt has been updated and is in agreement with d/c plan.  Cori Razor LCSW [phone]

## 2012-07-28 NOTE — Discharge Instructions (Signed)
Total Knee Replacement Care After These instructions give you information on caring for yourself after your procedure. Your doctor also may give you specific instructions. Call your doctor if you have any problems or questions after your procedure. HOME CARE  See a physical therapist as told by your doctor.  Take medicines as told by your doctor.  Do not lift or drive until your doctor says it is okay.  Use crutches, a walker, or a cane as told by your doctor.  If you were given a knee joint motion machine, use it as told. GET HELP RIGHT AWAY IF:   You have a fever.  You have a rash.  You have shortness of breath or chest pain.  You have pain or puffiness (swelling) in your calf or thigh.  Your ability to move your knee is decreasing rather than increasing.  Your knee pain is increasing daily.  Your wound is red, puffy, or is getting more painful.  You have yellowish-white fluid (pus) coming from your wound.  You have a bad smell coming from your wound.  Your wound opens up after sutures (stitches) or staples are removed.  Your wound will not stop bleeding. MAKE SURE YOU:   Understand these instructions.  Will watch your condition.  Will get help right away if you are not doing well or get worse. Document Released: 05/24/2011 Document Reviewed: 05/24/2011 [facility] Patient Information 2013 [place], [place].

## 2012-07-28 NOTE — Progress Notes (Signed)
Patient hypotensive with tachycardia during routine vital check.  Patient is asymptomatic.  Paged [facility] PA, received orders to hold cardura tonight and give NS bolus.  Will continue to monitor.

## 2012-07-28 NOTE — Discharge Summary (Signed)
Physician Discharge Summary   Patient ID: [name] MRN: [id number] DOB/AGE: 1930/02/09 79 y.o.  Admit date: 07/26/2012 Discharge date:   Primary Diagnosis: Osteoarthritis, right knee  Admission Diagnoses:  Past Medical History  Diagnosis Date  . Hypertension   . BPH (benign prostatic hyperplasia)   . Hyperlipidemia   . Cancer 2008-2009    prostate TREATED WITH RADIATION  . AAA (abdominal aortic aneurysm) 04/2012    STENTING OF AAA IN [place]   Discharge Diagnoses:   Active Problems:   Osteoarthritis of right knee   Postoperative anemia due to acute blood loss  Estimated body mass index is 37.83 kg/(m^2) as calculated from the following:   Height as of this encounter: 6' (1.829 m).   Weight as of this encounter: 126.554 kg (279 lb).  Procedure:  Procedure(s) (LRB): RIGHT TOTAL KNEE ARTHROPLASTY (Right)   Consults: None  HPI: [name], 79 y.o. male, has a history of pain and functional disability in the right knee due to arthritis and has failed non-surgical conservative treatments for greater than 12 weeks to includeNSAID's and/or analgesics, corticosteriod injections, use of assistive devices and activity modification. Onset of symptoms was gradual, starting >10 years ago with gradually worsening course since that time. The patient noted no past surgery on the right knee(s). Patient currently rates pain in the right knee(s) at 7 out of 10 with activity. Patient has night pain, worsening of pain with activity and weight bearing, pain that interferes with activities of daily living, pain with passive range of motion, crepitus and joint swelling. Patient has evidence of periarticular osteophytes and joint space narrowing by imaging studies. There is no active infection.   Laboratory Data: Admission on 07/26/2012  Component Date Value Range Status  . ABO/RH(D) 07/18/2012 O POS   Final  . WBC 07/27/2012 9.1  4.0 - 10.5 K/uL Final  . RBC 07/27/2012 3.90* 4.22 -  5.81 MIL/uL Final  . Hemoglobin 07/27/2012 10.5* 13.0 - 17.0 g/dL Final  . HCT 28/41/3244 33.6* 39.0 - 52.0 % Final  . MCV 07/27/2012 86.2  78.0 - 100.0 fL Final  . MCH 07/27/2012 26.9  26.0 - 34.0 pg Final  . MCHC 07/27/2012 31.3  30.0 - 36.0 g/dL Final  . RDW 03/17/7251 16.0* 11.5 - 15.5 % Final  . Platelets 07/27/2012 95* 150 - 400 K/uL Final   Comment: REPEATED TO VERIFY                          SPECIMEN CHECKED FOR CLOTS  . Sodium 07/27/2012 136  135 - 145 mEq/L Final  . Potassium 07/27/2012 5.0  3.5 - 5.1 mEq/L Final  . Chloride 07/27/2012 102  96 - 112 mEq/L Final  . CO2 07/27/2012 28  19 - 32 mEq/L Final  . Glucose, Bld 07/27/2012 153* 70 - 99 mg/dL Final  . BUN 66/44/0347 20  6 - 23 mg/dL Final  . Creatinine, Ser 07/27/2012 1.05  0.50 - 1.35 mg/dL Final  . Calcium 42/59/5638 8.8  8.4 - 10.5 mg/dL Final  . GFR calc non Af Amer 07/27/2012 65* >90 mL/min Final  . GFR calc Af Amer 07/27/2012 75* >90 mL/min Final   Comment:                                 The eGFR has been calculated  using the CKD EPI equation.                          This calculation has not been                          validated in all clinical                          situations.                          eGFR's persistently                          <90 mL/min signify                          possible Chronic Kidney Disease.  . WBC 07/28/2012 8.7  4.0 - 10.5 K/uL Final  . RBC 07/28/2012 3.09* 4.22 - 5.81 MIL/uL Final  . Hemoglobin 07/28/2012 8.5* 13.0 - 17.0 g/dL Final   Comment: REPEATED TO VERIFY                          DELTA CHECK NOTED  . HCT 07/28/2012 26.2* 39.0 - 52.0 % Final  . MCV 07/28/2012 84.8  78.0 - 100.0 fL Final  . MCH 07/28/2012 27.5  26.0 - 34.0 pg Final  . MCHC 07/28/2012 32.4  30.0 - 36.0 g/dL Final  . RDW 16/12/9602 15.9* 11.5 - 15.5 % Final  . Platelets 07/28/2012 81* 150 - 400 K/uL Final   CONSISTENT WITH PREVIOUS RESULT  . Sodium 07/28/2012 131* 135 -  145 mEq/L Final  . Potassium 07/28/2012 4.3  3.5 - 5.1 mEq/L Final  . Chloride 07/28/2012 98  96 - 112 mEq/L Final  . CO2 07/28/2012 28  19 - 32 mEq/L Final  . Glucose, Bld 07/28/2012 129* 70 - 99 mg/dL Final  . BUN 54/11/8117 25* 6 - 23 mg/dL Final  . Creatinine, Ser 07/28/2012 1.17  0.50 - 1.35 mg/dL Final  . Calcium 14/78/2956 8.5  8.4 - 10.5 mg/dL Final  . GFR calc non Af Amer 07/28/2012 57* >90 mL/min Final  . GFR calc Af Amer 07/28/2012 66* >90 mL/min Final   Comment:                                 The eGFR has been calculated                          using the CKD EPI equation.                          This calculation has not been                          validated in all clinical                          situations.                          eGFR's  persistently                          <90 mL/min signify                          possible Chronic Kidney Disease.  Hospital Outpatient Visit on 07/18/2012  Component Date Value Range Status  . MRSA, PCR 07/18/2012 NEGATIVE  NEGATIVE Final  . Staphylococcus aureus 07/18/2012 NEGATIVE  NEGATIVE Final   Comment:                                 The Xpert SA Assay (FDA                          approved for NASAL specimens                          in patients over 77 years of age),                          is one component of                          a comprehensive surveillance                          program.  Test performance has                          been validated by [employer] for patients greater                          than or equal to 25 year old.                          It is not intended                          to diagnose infection nor to                          guide or monitor treatment.  . WBC 07/18/2012 5.4  4.0 - 10.5 K/uL Final  . RBC 07/18/2012 4.71  4.22 - 5.81 MIL/uL Final  . Hemoglobin 07/18/2012 13.0  13.0 - 17.0 g/dL Final  . HCT 47/82/9562 40.6  39.0 - 52.0 % Final  .  MCV 07/18/2012 86.2  78.0 - 100.0 fL Final  . MCH 07/18/2012 27.6  26.0 - 34.0 pg Final  . MCHC 07/18/2012 32.0  30.0 - 36.0 g/dL Final  . RDW 13/10/6576 16.5* 11.5 - 15.5 % Final  . Platelets 07/18/2012 126* 150 - 400 K/uL Final  . aPTT 07/18/2012 30  24 - 37 seconds Final  . Sodium 07/18/2012 141  135 - 145 mEq/L Final  . Potassium 07/18/2012 4.3  3.5 - 5.1 mEq/L Final  . Chloride 07/18/2012 106  96 - 112 mEq/L Final  . CO2 07/18/2012  27  19 - 32 mEq/L Final  . Glucose, Bld 07/18/2012 122* 70 - 99 mg/dL Final  . BUN 16/12/9602 20  6 - 23 mg/dL Final  . Creatinine, Ser 07/18/2012 1.20  0.50 - 1.35 mg/dL Final  . Calcium 54/11/8117 9.7  8.4 - 10.5 mg/dL Final  . Total Protein 07/18/2012 6.2  6.0 - 8.3 g/dL Final  . Albumin 14/78/2956 3.1* 3.5 - 5.2 g/dL Final  . AST 21/30/8657 18  0 - 37 U/L Final  . ALT 07/18/2012 12  0 - 53 U/L Final  . Alkaline Phosphatase 07/18/2012 59  39 - 117 U/L Final  . Total Bilirubin 07/18/2012 0.3  0.3 - 1.2 mg/dL Final  . GFR calc non Af Amer 07/18/2012 55* >90 mL/min Final  . GFR calc Af Amer 07/18/2012 64* >90 mL/min Final   Comment:                                 The eGFR has been calculated                          using the CKD EPI equation.                          This calculation has not been                          validated in all clinical                          situations.                          eGFR's persistently                          <90 mL/min signify                          possible Chronic Kidney Disease.  Marland Kitchen Prothrombin Time 07/18/2012 13.4  11.6 - 15.2 seconds Final  . INR 07/18/2012 1.03  0.00 - 1.49 Final  . ABO/RH(D) 07/18/2012 O POS   Final  . Antibody Screen 07/18/2012 NEG   Final  . Sample Expiration 07/18/2012 07/29/2012   Final  . Color, Urine 07/18/2012 YELLOW  YELLOW Final  . APPearance 07/18/2012 CLEAR  CLEAR Final  . Specific Gravity, Urine 07/18/2012 1.028  1.005 - 1.030 Final  . pH 07/18/2012 5.0  5.0 - 8.0  Final  . Glucose, UA 07/18/2012 NEGATIVE  NEGATIVE mg/dL Final  . Hgb urine dipstick 07/18/2012 TRACE* NEGATIVE Final  . Bilirubin Urine 07/18/2012 NEGATIVE  NEGATIVE Final  . Ketones, ur 07/18/2012 NEGATIVE  NEGATIVE mg/dL Final  . Protein, ur 84/69/6295 NEGATIVE  NEGATIVE mg/dL Final  . Urobilinogen, UA 07/18/2012 0.2  0.0 - 1.0 mg/dL Final  . Nitrite 28/41/3244 NEGATIVE  NEGATIVE Final  . Leukocytes, UA 07/18/2012 NEGATIVE  NEGATIVE Final  . RBC / HPF 07/18/2012 3-6  <3 RBC/hpf Final  . Bacteria, UA 07/18/2012 FEW* RARE Final  . Urine-Other 07/18/2012 MUCOUS PRESENT   Final     X-Rays:Dg Chest 2 View  07/18/2012   *RADIOLOGY REPORT*  Clinical Data: Preoperative evaluation for right total knee replacement,  hypertension  CHEST - 2 VIEW  Comparison: 10/22/2006  Findings: Enlargement of cardiac silhouette. Atherosclerotic calcification aortic arch. Mediastinal contours and pulmonary vascularity normal. Chronic bibasilar atelectasis versus scarring. No acute infiltrate, pleural effusion or pneumothorax. Endplate spur formation thoracic spine.  IMPRESSION: Chronic bibasilar atelectasis versus scarring. Enlargement of cardiac silhouette. No acute abnormalities.   Original Report Authenticated By: Ulyses Southward, M.D.   Dg Chest Port 1 View  07/27/2012   *RADIOLOGY REPORT*  Clinical Data: P of decreased oxygen saturation levels. History of hypertension.  PORTABLE CHEST - 1 VIEW  Comparison: 07/18/2012.  Findings: There is moderate enlargement of the cardiac silhouette. Ectasia of thoracic aorta is seen.  The aortic arch is prominent. Aneurysmal dilatation cannot be excluded.  There is elevation of the left hemidiaphragm with minimal atelectasis in the left lung base.  There is atelectasis and hazy infiltrative density in the right base.  Right costophrenic angle is slightly indistinct. Small amount of right pleural fluid cannot be excluded.  There is slight scoliosis convexity to the right.  There is  degenerative spondylosis.  IMPRESSION: Moderate cardiac silhouette enlargement.  Ectasia and prominence of the aortic arch.  Aneurysmal dilatation cannot be excluded. Elevated left hemidiaphragm with minimal left basilar atelectasis. Atelectasis and infiltrative density in right base.  Small amount of right pleural fluid cannot be excluded.   Original Report Authenticated By: Onalee Hua Call   Dg Knee Right Port  07/26/2012   *RADIOLOGY REPORT*  Clinical Data: Post right total knee replacement  PORTABLE RIGHT KNEE - 1-2 VIEW  Comparison: 05/19/2012  Findings:  Post right total knee replacement without evidence of hardware failure or loosening.  No fracture.  Alignment appears near anatomic.  There is a small, expected postoperative joint effusion. A surgical drain is seen with the superior aspect of the knee joint space.  There is a minimal amount of expected subcutaneous emphysema about the operative site.  No radiopaque foreign body.  IMPRESSION: Post right total knee replacement without complicating feature.   Original Report Authenticated By: Tacey Ruiz, MD    Hospital Course: [name] is a 79 y.o. who was admitted to [facility]. They were brought to the operating room on 07/26/2012 and underwent Procedure(s): RIGHT TOTAL KNEE ARTHROPLASTY.  Patient tolerated the procedure well and was later transferred to the recovery room and then to the orthopaedic floor for postoperative care.  They were given PO and IV analgesics for pain control following their surgery.  They were given 24 hours of postoperative antibiotics of  Anti-infectives   Start     Dose/Rate Route Frequency Ordered Stop   07/26/12 2000  clindamycin (CLEOCIN) IVPB 600 mg     600 mg 100 mL/hr over 30 Minutes Intravenous Every 6 hours 07/26/12 1651 07/27/12 0255   07/26/12 1317  polymyxin B 500,000 Units, bacitracin 50,000 Units in sodium chloride irrigation 0.9 % 500 mL irrigation  Status:  Discontinued       As needed  07/26/12 1317 07/26/12 1505   07/26/12 1103  clindamycin (CLEOCIN) IVPB 900 mg     900 mg 100 mL/hr over 30 Minutes Intravenous On call to O.R. 07/26/12 1103 07/26/12 1256     and started on DVT prophylaxis in the form of Xarelto.   PT and OT were ordered for total joint protocol.  Discharge planning consulted to help with postop disposition and equipment needs.  Patient had a fair night on the evening of surgery.  They started to get up  OOB with therapy on day one. Hemovac drain was pulled without difficulty.  Dressing was saturated with frank blood and was therefore changed. Continued to work with therapy into day two.  Dressing was changed on day two and the incision was healing but with continued mild frank bloody drainage. On day three, patient plans for work with therapy and be discharge to SNF .   Discharge Medications: Prior to Admission medications   Medication Sig Start Date End Date Taking? Authorizing Provider  doxazosin (CARDURA) 8 MG tablet Take 8 mg by mouth at bedtime.    Yes Historical Provider, MD  simvastatin (ZOCOR) 40 MG tablet Take 40 mg by mouth every evening.   Yes Historical Provider, MD  tolterodine (DETROL LA) 4 MG 24 hr capsule Take 4 mg by mouth daily. TAKES IN AM   Yes Historical Provider, MD  bisacodyl (DULCOLAX) 10 MG suppository Place 1 suppository (10 mg total) rectally daily as needed. 07/28/12   Joban Colledge Tamala Ser, PA-C  ferrous sulfate 325 (65 FE) MG tablet Take 1 tablet (325 mg total) by mouth 3 (three) times daily after meals. 07/28/12   Riyanna Crutchley Tamala Ser, PA-C  methocarbamol (ROBAXIN) 500 MG tablet Take 1 tablet (500 mg total) by mouth every 6 (six) hours as needed. 07/28/12   Alima Naser Tamala Ser, PA-C  oxyCODONE (OXY IR/ROXICODONE) 5 MG immediate release tablet Take 1-3 tablets (5-15 mg total) by mouth every 4 (four) hours as needed for pain. 07/28/12   Lasheka Kempner Tamala Ser, PA-C  polyethylene glycol (MIRALAX / GLYCOLAX) packet Take 17 g by mouth  daily as needed. 07/28/12   Cuahutemoc Attar Tamala Ser, PA-C  rivaroxaban (XARELTO) 10 MG TABS tablet Take 1 tablet (10 mg total) by mouth daily with breakfast. 07/28/12   Laiana Fratus Tamala Ser, PA-C    Diet: Cardiac diet Activity:WBAT Follow-up:in 2 weeks Disposition - Skilled nursing facility Discharged Condition: fair   Discharge Orders   Future Orders Complete By Expires     Call MD / Call 911  As directed     Comments:      If you experience chest pain or shortness of breath, CALL 911 and be transported to the hospital emergency room.  If you develope a fever above 101 F, pus (white drainage) or increased drainage or redness at the wound, or calf pain, call your surgeon's office.    Change dressing  As directed     Comments:      Change dressing daily with sterile 4 x 4 inch gauze dressing    Constipation Prevention  As directed     Comments:      Drink plenty of fluids.  Prune juice may be helpful.  You may use a stool softener, such as Colace (over the counter) 100 mg twice a day.  Use MiraLax (over the counter) for constipation as needed.    Diet - low sodium heart healthy  As directed     Discharge instructions  As directed     Comments:      Walk with your walker. Weight bearing as instructed. Change your dressing daily. Shower only, no tub bath. Call if any temperatures greater than 101 or any wound complications: [phone] during the day and ask for Dr. Jeannetta Ellis nurse, Mackey Birchwood.    Do not put a pillow under the knee. Place it under the heel.  As directed     Driving restrictions  As directed     Comments:      No driving  Increase activity slowly as tolerated  As directed         Medication List    STOP taking these medications       aspirin EC 81 MG tablet      TAKE these medications       bisacodyl 10 MG suppository  Commonly known as:  DULCOLAX  Place 1 suppository (10 mg total) rectally daily as needed.     doxazosin 8 MG tablet  Commonly known  as:  CARDURA  Take 8 mg by mouth at bedtime.     ferrous sulfate 325 (65 FE) MG tablet  Take 1 tablet (325 mg total) by mouth 3 (three) times daily after meals.     methocarbamol 500 MG tablet  Commonly known as:  ROBAXIN  Take 1 tablet (500 mg total) by mouth every 6 (six) hours as needed.     oxyCODONE 5 MG immediate release tablet  Commonly known as:  Oxy IR/ROXICODONE  Take 1-3 tablets (5-15 mg total) by mouth every 4 (four) hours as needed for pain.     polyethylene glycol packet  Commonly known as:  MIRALAX / GLYCOLAX  Take 17 g by mouth daily as needed.     rivaroxaban 10 MG Tabs tablet  Commonly known as:  XARELTO  Take 1 tablet (10 mg total) by mouth daily with breakfast.     simvastatin 40 MG tablet  Commonly known as:  ZOCOR  Take 40 mg by mouth every evening.     tolterodine 4 MG 24 hr capsule  Commonly known as:  DETROL LA  Take 4 mg by mouth daily. TAKES IN AM           Follow-up Information   Follow up with [name],RONALD A, MD. Schedule an appointment as soon as possible for a visit in 2 weeks.   Contact information:   [address], [address] [place] [address] [phone]       Signed: Kerby Nora 07/28/2012, 11:41 AM

## 2012-07-29 ENCOUNTER — Inpatient Hospital Stay
Admission: RE | Admit: 2012-07-29 | Discharge: 2012-08-11 | Disposition: A | Discharge status: Home / Self Care | Payer: Medicare Other | Source: Ambulatory Visit | Attending: Internal Medicine | Admitting: Internal Medicine

## 2012-07-29 LAB — CBC
HCT: 24.3 % — ABNORMAL LOW (ref 39.0–52.0)
Hemoglobin: 7.7 g/dL — ABNORMAL LOW (ref 13.0–17.0)
MCH: 27.1 pg (ref 26.0–34.0)
MCHC: 31.7 g/dL (ref 30.0–36.0)
MCV: 85.6 fL (ref 78.0–100.0)
Platelets: 94 K/uL — ABNORMAL LOW (ref 150–400)
RBC: 2.84 MIL/uL — ABNORMAL LOW (ref 4.22–5.81)
RDW: 16.1 % — ABNORMAL HIGH (ref 11.5–15.5)
WBC: 6.6 K/uL (ref 4.0–10.5)

## 2012-07-29 MED ORDER — FERROUS SULFATE 325 (65 FE) MG PO TABS: 325.0000 mg | ORAL_TABLET | Freq: Three times a day (TID) | ORAL | Status: DC

## 2012-07-29 NOTE — Plan of Care (Signed)
Problem: Discharge Progression Outcomes Goal: Discharge plan in place and appropriate Outcome: Completed/Met Date Met:  07/29/12 [facility]

## 2012-07-29 NOTE — Progress Notes (Signed)
Report called to Merrilee Jansky, RN @ [facility].

## 2012-07-29 NOTE — Progress Notes (Signed)
Patient discharged to [facility] via EMS all belongings sent with patient.

## 2012-07-29 NOTE — Progress Notes (Signed)
Subjective: 3 Days Post-Op Procedure(s) (LRB): RIGHT TOTAL KNEE ARTHROPLASTY (Right) Patient reports pain as well controlled. Tolerating PO's well, Passing flatus. Denies SOB, CP, or Calf pain. Denies dizziness or other anemia type symptoms.   Sates he is ready to D/c to SNF.  Objective: Vital signs in last 24 hours: Temp:  [98.7 F (37.1 C)-98.9 F (37.2 C)] 98.7 F (37.1 C) (05/17 0530) Pulse Rate:  [95-112] 99 (05/17 0530) Resp:  [16] 16 (05/17 0530) BP: (93-123)/(62-77) 122/77 mmHg (05/17 0530) SpO2:  [91 %-97 %] 96 % (05/17 0530)  Intake/Output from previous day: 05/16 0701 - 05/17 0700 In: 1220 [P.O.:720; IV Piggyback:500] Out: 1875 [Urine:1875] Intake/Output this shift: Total I/O In: 480 [P.O.:480] Out: 350 [Urine:350]   Recent Labs  07/27/12 0355 07/28/12 0409 07/28/12 1356 07/29/12 0550  HGB 10.5* 8.5* 8.6* 7.7*    Recent Labs  07/28/12 0409 07/28/12 1356 07/29/12 0550  WBC 8.7  --  6.6  RBC 3.09*  --  2.84*  HCT 26.2* 26.3* 24.3*  PLT 81*  --  94*    Recent Labs  07/27/12 0355 07/28/12 0409  NA 136 131*  K 5.0 4.3  CL 102 98  CO2 28 28  BUN 20 25*  CREATININE 1.05 1.17  GLUCOSE 153* 129*  CALCIUM 8.8 8.5   No results found for this basename: LABPT, INR,  in the last 72 hours  Well nourished. Alert and oriented. RRR, Lungs clear, BS positive x4, right calf soft and non-tender. Rigth LE neurovascularly intact. Dressing changed. Some serosanguinous drainage noted. Water blister also noted at the distal incision region.  Assessment/Plan: 3 Days Post-Op Procedure(s) (LRB): RIGHT TOTAL KNEE ARTHROPLASTY (Right) D/c to [facility] for Rehab today. Follow D/C instructions and take medicine as directed. Follow up in 2 weeks for office visit.  Anemia: Post-op related Take Iron as directed.   Eloisa Chokshi L 07/29/2012, 9:41 AM

## 2012-07-29 NOTE — Progress Notes (Signed)
Per MD, Pt ready for d/c.  Notified RN, Pt, and facility.  Per Pt, family aware of d/c today.  Thus, contact from CSW is not needed.  Sent d/c summary, prog note from today and narcotic scripts, as requested by Olegario Messier, Environmental education officer at [employer].  Facility ready to receive Pt.  Arranged for transportation.  Providence Crosby, LCSWA Clinical Social Work [phone]

## 2012-07-31 ENCOUNTER — Non-Acute Institutional Stay (SKILLED_NURSING_FACILITY): Payer: Medicare Other | Admitting: Internal Medicine

## 2012-07-31 DIAGNOSIS — L03119 Cellulitis of unspecified part of limb: Secondary | ICD-10-CM

## 2012-07-31 DIAGNOSIS — L02419 Cutaneous abscess of limb, unspecified: Secondary | ICD-10-CM

## 2012-07-31 DIAGNOSIS — I359 Nonrheumatic aortic valve disorder, unspecified: Secondary | ICD-10-CM

## 2012-07-31 DIAGNOSIS — D649 Anemia, unspecified: Secondary | ICD-10-CM

## 2012-07-31 DIAGNOSIS — I350 Nonrheumatic aortic (valve) stenosis: Secondary | ICD-10-CM

## 2012-07-31 DIAGNOSIS — N400 Enlarged prostate without lower urinary tract symptoms: Secondary | ICD-10-CM

## 2012-07-31 NOTE — Progress Notes (Signed)
Patient ID: [name], male   DOB: 1929/07/27, 79 y.o.   MRN: [id number] Chief complaint; admission to [facility] May 14, through 07/29/2012. This is admission to SNF.  History; patient was admitted to hospital electively due to her pain and functional disability in the right knee. This was due to osteoarthritis. He failed nonsurgical conservative measures for greater than 12 weeks. Patient underwent an elective right total knee replacement. He was placed on xarelto for DVT prophylaxis. His postoperative hemoglobin very from 7.7-8.5.  Past medical history/problem. #1 osteoarthritis post left total knee replacement 20 years ago. Now. Total knee her placement on the right. #2 BPH. #3 history of prostate cancer treated with radiation 2008. #4 abdominal aortic aneurysm stenting in 2014. #5 Hypercholestolemia  Medications; Cardura 8 mg each bedtime, ferrous sulfate 325 3 times a day, Zocor 40 mg q. evening, xarelto 10 mg daily, [DVT prophylaxis.], Detrol LA 4 mg daily, oxycodone when necessary, Robaxin when necessary, Dulcolax suppository, when necessary  Social history; nonsmoker for many years. Uses a cane. Other than this independent with ADLs and IADLs. Still driving. Lives with his wife, who is more dependent. family history is not on file.  Review of systems Respiratory no or exertional shortness of breath. Cardiac no chest pain or palpitations. No history of heart murmur Abdomen no nausea, vomiting, or diarrhea. GU urinary frequency. No dysuria.  Physical exam; pulse 82, respirations 18 , blood pressure 106/54, temperature 90.8 Respiratory clear entry bilaterally. Cardiac 3/ 6 systolic ejection murmur heard at the left sternal border and aortic area  radiating faintly into the right carotid. Abdomen no liver no spleen no tenderness. No masses. GU bladder is not distended. No CVA tenderness. Musculoskeletal right knee large blister over the lower part of the incision filled with  clear fluid. This was opened and culture. There are smaller blisters, which appeared to be sanguinous in the mid aspect of the incision. Surrounding erythema is noted on both sides of the incision.  Impression/plan #1 cellulitis of the right knee incision based on erythema and tenderness. I will start him on doxycycline. He is allergic to penicillin. Culture of a blister from the lower aspect of the wound was obtained. The wound will be dressed with silver alginate and a dry dressing. #2 urinary frequency secondary to a history of BPH, possibly prostate cancer, on Detrol LA, and Cardura #3 hypertension. #4 DVT prophylaxis with xarelto. He will not need this when he leaves the facility and probably can be stopped in 2 weeks' time. #5 hyperlipidemia on Zocor. #6. Hemoglobin is down to 7.3 on iron. I will monitor this in 2 days' time. He may require transfusion. #7, heart murmur clinically aortic stenosis. He has not had an echo. I will arrange this.  Patient is a good candidate for rehabilitation, motivated, and cognitively intact. I will get I will order an echocardiogram. Started on doxycycline monitor the right knee. Careful

## 2012-08-01 ENCOUNTER — Ambulatory Visit (HOSPITAL_COMMUNITY)
Admission: RE | Admit: 2012-08-01 | Discharge: 2012-08-01 | Disposition: A | Discharge status: Home / Self Care | Payer: Medicare Other | Source: Ambulatory Visit | Attending: Internal Medicine | Admitting: Internal Medicine

## 2012-08-01 DIAGNOSIS — R29898 Other symptoms and signs involving the musculoskeletal system: Secondary | ICD-10-CM | POA: Insufficient documentation

## 2012-08-01 DIAGNOSIS — I359 Nonrheumatic aortic valve disorder, unspecified: Secondary | ICD-10-CM

## 2012-08-01 DIAGNOSIS — I10 Essential (primary) hypertension: Secondary | ICD-10-CM | POA: Insufficient documentation

## 2012-08-01 DIAGNOSIS — M7989 Other specified soft tissue disorders: Secondary | ICD-10-CM | POA: Insufficient documentation

## 2012-08-01 NOTE — Progress Notes (Signed)
*  PRELIMINARY RESULTS* Echocardiogram 2D Echocardiogram has been performed.  [name] [name] 08/01/2012, 3:43 PM 

## 2012-08-02 ENCOUNTER — Non-Acute Institutional Stay (SKILLED_NURSING_FACILITY): Payer: Medicare Other | Admitting: Internal Medicine

## 2012-08-02 ENCOUNTER — Other Ambulatory Visit: Payer: Self-pay | Admitting: *Deleted

## 2012-08-02 DIAGNOSIS — L02419 Cutaneous abscess of limb, unspecified: Secondary | ICD-10-CM

## 2012-08-02 DIAGNOSIS — L03119 Cellulitis of unspecified part of limb: Secondary | ICD-10-CM

## 2012-08-02 MED ORDER — OXYCODONE HCL 5 MG PO TABS: ORAL_TABLET | ORAL | Status: DC

## 2012-08-13 NOTE — Progress Notes (Signed)
Patient ID: [name], male   DOB: 1929-10-11, 79 y.o.   MRN: [id number]           PROGRESS NOTE  DATE:  08/02/2012  FACILITY: [facility]   LEVEL OF CARE:   SNF   Acute Visit   CHIEF COMPLAINT:  Review of right knee incision.    HISTORY OF PRESENT ILLNESS:  I admitted [name] to the facility two days ago.  He had a huge blister at the inferior aspect of the incision which I opened and cultured.  The culture was, fortunately, negative.  More concerning was a circular area of erythema which I thought could be incisional cellulitis.  I put him on doxycycline.  He has no real complaints other than pain with physical therapy.    PHYSICAL EXAMINATION:   MUSCULOSKELETAL:   EXTREMITIES:   RIGHT LOWER EXTREMITY:  Right knee:  The area of erythema is much better.  There is an open area with denuded skin where the blister was.  This is better.  He has some superficial blisters remaining in the mid part of the incision.  He has an extensive amount of blood loss here, old bruising, into the posterior thigh and down the calf.   The calf is edematous.  I think this is simply blood loss.  His hemoglobin was 7.3 on 07/31/2012.  This was due to be followed today.    ASSESSMENT/PLAN:  Cellulitis in incision of the knee.  I do not believe this represents a deeper infection or a serious problem.   Doxycycline has been effective.  Silver alginate to the huge blister site, which has been completely evacuated.    Anemia.  I am waiting for the follow-up hemoglobin.  This is obviously postop.  If it is below 7, he will need a transfusion.    Swelling of the right leg.  I do not believe this represents a DVT.  There is intense discoloration from significant blood loss.   I will follow this clinically for now.   CPT CODE: 40981

## 2012-09-10 NOTE — ED Provider Notes (Signed)
Unknown significance of "message"  Donnetta Hutching, MD 09/10/12 1044

## 2012-09-29 ENCOUNTER — Ambulatory Visit (INDEPENDENT_AMBULATORY_CARE_PROVIDER_SITE_OTHER): Payer: Medicare Other | Admitting: Urology

## 2012-09-29 DIAGNOSIS — N200 Calculus of kidney: Secondary | ICD-10-CM

## 2012-09-29 DIAGNOSIS — R82998 Other abnormal findings in urine: Secondary | ICD-10-CM

## 2012-09-29 DIAGNOSIS — R350 Frequency of micturition: Secondary | ICD-10-CM

## 2012-09-29 DIAGNOSIS — C61 Malignant neoplasm of prostate: Secondary | ICD-10-CM

## 2013-03-15 HISTORY — PX: VENA CAVA FILTER PLACEMENT: SUR1032

## 2013-03-16 ENCOUNTER — Ambulatory Visit (HOSPITAL_COMMUNITY)
Admission: RE | Admit: 2013-03-16 | Discharge: 2013-03-16 | Disposition: A | Discharge status: Home / Self Care | Payer: Medicare Other | Source: Ambulatory Visit | Attending: Urology | Admitting: Urology

## 2013-03-16 ENCOUNTER — Other Ambulatory Visit: Payer: Self-pay | Admitting: Urology

## 2013-03-16 DIAGNOSIS — C61 Malignant neoplasm of prostate: Secondary | ICD-10-CM | POA: Insufficient documentation

## 2013-03-16 DIAGNOSIS — N200 Calculus of kidney: Secondary | ICD-10-CM

## 2013-03-21 ENCOUNTER — Other Ambulatory Visit (HOSPITAL_COMMUNITY): Payer: Medicare Other

## 2013-03-23 ENCOUNTER — Ambulatory Visit (INDEPENDENT_AMBULATORY_CARE_PROVIDER_SITE_OTHER): Payer: Medicare Other | Admitting: Urology

## 2013-03-23 ENCOUNTER — Ambulatory Visit (HOSPITAL_COMMUNITY)
Admission: RE | Admit: 2013-03-23 | Discharge: 2013-03-23 | Disposition: A | Discharge status: Home / Self Care | Payer: Medicare Other | Source: Ambulatory Visit | Attending: Internal Medicine | Admitting: Internal Medicine

## 2013-03-23 DIAGNOSIS — E785 Hyperlipidemia, unspecified: Secondary | ICD-10-CM | POA: Insufficient documentation

## 2013-03-23 DIAGNOSIS — N320 Bladder-neck obstruction: Secondary | ICD-10-CM

## 2013-03-23 DIAGNOSIS — C61 Malignant neoplasm of prostate: Secondary | ICD-10-CM

## 2013-03-23 DIAGNOSIS — R3915 Urgency of urination: Secondary | ICD-10-CM

## 2013-03-23 DIAGNOSIS — N200 Calculus of kidney: Secondary | ICD-10-CM

## 2013-03-23 DIAGNOSIS — Z6834 Body mass index (BMI) 34.0-34.9, adult: Secondary | ICD-10-CM | POA: Insufficient documentation

## 2013-03-23 DIAGNOSIS — I10 Essential (primary) hypertension: Secondary | ICD-10-CM | POA: Insufficient documentation

## 2013-03-23 DIAGNOSIS — I359 Nonrheumatic aortic valve disorder, unspecified: Secondary | ICD-10-CM | POA: Insufficient documentation

## 2013-03-23 DIAGNOSIS — Z87891 Personal history of nicotine dependence: Secondary | ICD-10-CM | POA: Insufficient documentation

## 2013-03-23 DIAGNOSIS — N3020 Other chronic cystitis without hematuria: Secondary | ICD-10-CM

## 2013-03-23 NOTE — Progress Notes (Signed)
*  PRELIMINARY RESULTS* Echocardiogram 2D Echocardiogram has been performed.  Tumacacori-Carmen, [place] 03/23/2013, 1:34 PM

## 2013-04-09 ENCOUNTER — Other Ambulatory Visit: Payer: Self-pay | Admitting: Urology

## 2013-04-09 DIAGNOSIS — N200 Calculus of kidney: Secondary | ICD-10-CM

## 2013-04-15 DIAGNOSIS — I82409 Acute embolism and thrombosis of unspecified deep veins of unspecified lower extremity: Secondary | ICD-10-CM

## 2013-04-15 HISTORY — DX: Acute embolism and thrombosis of unspecified deep veins of unspecified lower extremity: I82.409

## 2013-04-18 ENCOUNTER — Encounter (HOSPITAL_COMMUNITY): Payer: Self-pay | Admitting: Pharmacy Technician

## 2013-04-20 ENCOUNTER — Inpatient Hospital Stay (HOSPITAL_COMMUNITY): Admission: RE | Admit: 2013-04-20 | Payer: Medicare Other | Source: Ambulatory Visit

## 2013-04-24 ENCOUNTER — Other Ambulatory Visit: Payer: Self-pay | Admitting: Radiology

## 2013-04-25 ENCOUNTER — Encounter (HOSPITAL_COMMUNITY): Payer: Self-pay

## 2013-04-25 ENCOUNTER — Inpatient Hospital Stay (HOSPITAL_COMMUNITY)
Admission: RE | Admit: 2013-04-25 | Discharge: 2013-05-14 | DRG: 660 | Disposition: A | Discharge status: Skilled Nursing Facility | Payer: Medicare Other | Source: Ambulatory Visit | Attending: Urology | Admitting: Urology

## 2013-04-25 DIAGNOSIS — I495 Sick sinus syndrome: Secondary | ICD-10-CM | POA: Diagnosis present

## 2013-04-25 DIAGNOSIS — R001 Bradycardia, unspecified: Secondary | ICD-10-CM

## 2013-04-25 DIAGNOSIS — I714 Abdominal aortic aneurysm, without rupture, unspecified: Secondary | ICD-10-CM

## 2013-04-25 DIAGNOSIS — N401 Enlarged prostate with lower urinary tract symptoms: Secondary | ICD-10-CM | POA: Diagnosis present

## 2013-04-25 DIAGNOSIS — N200 Calculus of kidney: Principal | ICD-10-CM

## 2013-04-25 DIAGNOSIS — R310 Gross hematuria: Secondary | ICD-10-CM

## 2013-04-25 DIAGNOSIS — I824Y9 Acute embolism and thrombosis of unspecified deep veins of unspecified proximal lower extremity: Secondary | ICD-10-CM | POA: Diagnosis not present

## 2013-04-25 DIAGNOSIS — B964 Proteus (mirabilis) (morganii) as the cause of diseases classified elsewhere: Secondary | ICD-10-CM | POA: Diagnosis present

## 2013-04-25 DIAGNOSIS — I359 Nonrheumatic aortic valve disorder, unspecified: Secondary | ICD-10-CM

## 2013-04-25 DIAGNOSIS — I5042 Chronic combined systolic (congestive) and diastolic (congestive) heart failure: Secondary | ICD-10-CM | POA: Diagnosis present

## 2013-04-25 DIAGNOSIS — Z96659 Presence of unspecified artificial knee joint: Secondary | ICD-10-CM

## 2013-04-25 DIAGNOSIS — E785 Hyperlipidemia, unspecified: Secondary | ICD-10-CM | POA: Diagnosis present

## 2013-04-25 DIAGNOSIS — Z7982 Long term (current) use of aspirin: Secondary | ICD-10-CM

## 2013-04-25 DIAGNOSIS — Z86718 Personal history of other venous thrombosis and embolism: Secondary | ICD-10-CM | POA: Diagnosis not present

## 2013-04-25 DIAGNOSIS — I509 Heart failure, unspecified: Secondary | ICD-10-CM | POA: Diagnosis present

## 2013-04-25 DIAGNOSIS — N179 Acute kidney failure, unspecified: Secondary | ICD-10-CM | POA: Diagnosis not present

## 2013-04-25 DIAGNOSIS — E7800 Pure hypercholesterolemia, unspecified: Secondary | ICD-10-CM | POA: Diagnosis present

## 2013-04-25 DIAGNOSIS — I10 Essential (primary) hypertension: Secondary | ICD-10-CM

## 2013-04-25 DIAGNOSIS — M7989 Other specified soft tissue disorders: Secondary | ICD-10-CM | POA: Diagnosis not present

## 2013-04-25 DIAGNOSIS — Z923 Personal history of irradiation: Secondary | ICD-10-CM

## 2013-04-25 DIAGNOSIS — Z9849 Cataract extraction status, unspecified eye: Secondary | ICD-10-CM

## 2013-04-25 DIAGNOSIS — I428 Other cardiomyopathies: Secondary | ICD-10-CM

## 2013-04-25 DIAGNOSIS — R3915 Urgency of urination: Secondary | ICD-10-CM | POA: Diagnosis present

## 2013-04-25 DIAGNOSIS — N390 Urinary tract infection, site not specified: Secondary | ICD-10-CM

## 2013-04-25 DIAGNOSIS — Z8546 Personal history of malignant neoplasm of prostate: Secondary | ICD-10-CM

## 2013-04-25 DIAGNOSIS — R5383 Other fatigue: Secondary | ICD-10-CM

## 2013-04-25 DIAGNOSIS — Z87442 Personal history of urinary calculi: Secondary | ICD-10-CM

## 2013-04-25 DIAGNOSIS — I82409 Acute embolism and thrombosis of unspecified deep veins of unspecified lower extremity: Secondary | ICD-10-CM

## 2013-04-25 DIAGNOSIS — N320 Bladder-neck obstruction: Secondary | ICD-10-CM | POA: Diagnosis present

## 2013-04-25 DIAGNOSIS — N138 Other obstructive and reflux uropathy: Secondary | ICD-10-CM | POA: Diagnosis present

## 2013-04-25 DIAGNOSIS — I498 Other specified cardiac arrhythmias: Secondary | ICD-10-CM

## 2013-04-25 DIAGNOSIS — I440 Atrioventricular block, first degree: Secondary | ICD-10-CM | POA: Diagnosis present

## 2013-04-25 DIAGNOSIS — I824Z9 Acute embolism and thrombosis of unspecified deep veins of unspecified distal lower extremity: Secondary | ICD-10-CM | POA: Diagnosis not present

## 2013-04-25 DIAGNOSIS — R5381 Other malaise: Secondary | ICD-10-CM | POA: Diagnosis present

## 2013-04-25 DIAGNOSIS — R350 Frequency of micturition: Secondary | ICD-10-CM | POA: Diagnosis present

## 2013-04-25 DIAGNOSIS — Z9079 Acquired absence of other genital organ(s): Secondary | ICD-10-CM

## 2013-04-25 DIAGNOSIS — I739 Peripheral vascular disease, unspecified: Secondary | ICD-10-CM | POA: Diagnosis present

## 2013-04-25 DIAGNOSIS — K5900 Constipation, unspecified: Secondary | ICD-10-CM | POA: Diagnosis present

## 2013-04-25 DIAGNOSIS — R609 Edema, unspecified: Secondary | ICD-10-CM | POA: Diagnosis present

## 2013-04-25 DIAGNOSIS — Z87891 Personal history of nicotine dependence: Secondary | ICD-10-CM

## 2013-04-25 DIAGNOSIS — D696 Thrombocytopenia, unspecified: Secondary | ICD-10-CM

## 2013-04-25 DIAGNOSIS — R110 Nausea: Secondary | ICD-10-CM | POA: Diagnosis present

## 2013-04-25 HISTORY — DX: Other cardiomyopathies: I42.8

## 2013-04-25 HISTORY — DX: Nonrheumatic aortic valve disorder, unspecified: I35.9

## 2013-04-25 HISTORY — DX: Other specified cardiac arrhythmias: I49.8

## 2013-04-25 HISTORY — DX: Bradycardia, unspecified: R00.1

## 2013-04-25 LAB — BASIC METABOLIC PANEL
BUN: 20 mg/dL (ref 6–23)
CALCIUM: 9.5 mg/dL (ref 8.4–10.5)
CHLORIDE: 103 meq/L (ref 96–112)
CO2: 28 meq/L (ref 19–32)
CREATININE: 1.21 mg/dL (ref 0.50–1.35)
GFR calc non Af Amer: 54 mL/min — ABNORMAL LOW (ref >90)
GFR, EST AFRICAN AMERICAN: 63 mL/min — AB (ref >90)
Glucose, Bld: 89 mg/dL (ref 70–99)
Potassium: 4.8 mEq/L (ref 3.7–5.3)
Sodium: 140 mEq/L (ref 137–147)

## 2013-04-25 LAB — PROTIME-INR
INR: 1.05 (ref 0.00–1.49)
Prothrombin Time: 13.5 seconds (ref 11.6–15.2)

## 2013-04-25 LAB — CBC
HEMATOCRIT: 42.9 % (ref 39.0–52.0)
Hemoglobin: 13.7 g/dL (ref 13.0–17.0)
MCH: 27.3 pg (ref 26.0–34.0)
MCHC: 31.9 g/dL (ref 30.0–36.0)
MCV: 85.5 fL (ref 78.0–100.0)
Platelets: 126 10*3/uL — ABNORMAL LOW (ref 150–400)
RBC: 5.02 MIL/uL (ref 4.22–5.81)
RDW: 14.6 % (ref 11.5–15.5)
WBC: 5.9 10*3/uL (ref 4.0–10.5)

## 2013-04-25 LAB — TYPE AND SCREEN
ABO/RH(D): O POS
Antibody Screen: NEGATIVE

## 2013-04-25 LAB — APTT: APTT: 29 s (ref 24–37)

## 2013-04-25 MED ORDER — ACETAMINOPHEN 325 MG PO TABS: 650.0000 mg | ORAL_TABLET | ORAL | Status: DC | PRN

## 2013-04-25 MED ORDER — DEXTROSE 5 % IV SOLN
700.0000 mg | INTRAVENOUS | Status: DC
Start: 2013-04-25 — End: 2013-04-26
  Administered 2013-04-25: 700 mg via INTRAVENOUS
  Filled 2013-04-25: qty 17.5

## 2013-04-25 MED ORDER — TOBRAMYCIN SULFATE 80 MG/2ML IJ SOLN
60.0000 mg | Freq: Once | INTRAMUSCULAR | Status: DC
Start: 2013-04-25 — End: 2013-04-25

## 2013-04-25 MED ORDER — DIPHENHYDRAMINE HCL 12.5 MG/5ML PO ELIX: 12.5000 mg | ORAL_SOLUTION | Freq: Four times a day (QID) | ORAL | Status: DC | PRN

## 2013-04-25 MED ORDER — ZOLPIDEM TARTRATE 5 MG PO TABS: 5.0000 mg | ORAL_TABLET | Freq: Every evening | ORAL | Status: DC | PRN

## 2013-04-25 MED ORDER — HYOSCYAMINE SULFATE 0.125 MG SL SUBL
0.1250 mg | SUBLINGUAL_TABLET | SUBLINGUAL | Status: DC | PRN
  Filled 2013-04-25: qty 1

## 2013-04-25 MED ORDER — DIPHENHYDRAMINE HCL 50 MG/ML IJ SOLN: 12.5000 mg | Freq: Four times a day (QID) | INTRAMUSCULAR | Status: DC | PRN

## 2013-04-25 MED ORDER — ONDANSETRON HCL 4 MG/2ML IJ SOLN: 4.0000 mg | INTRAMUSCULAR | Status: DC | PRN

## 2013-04-25 MED ORDER — HYDROCODONE-ACETAMINOPHEN 5-325 MG PO TABS: 1.0000 | ORAL_TABLET | ORAL | Status: DC | PRN

## 2013-04-25 MED ORDER — DOCUSATE SODIUM 100 MG PO CAPS
100.0000 mg | ORAL_CAPSULE | Freq: Two times a day (BID) | ORAL | Status: DC
Start: 2013-04-25 — End: 2013-04-26
  Administered 2013-04-25: 100 mg via ORAL
  Filled 2013-04-25 (×3): qty 1

## 2013-04-25 MED ORDER — CIPROFLOXACIN IN D5W 400 MG/200ML IV SOLN
400.0000 mg | INTRAVENOUS | Status: AC
  Administered 2013-04-26: 400 mg via INTRAVENOUS
  Filled 2013-04-25: qty 200

## 2013-04-25 MED ORDER — KCL IN DEXTROSE-NACL 20-5-0.45 MEQ/L-%-% IV SOLN
INTRAVENOUS | Status: DC
  Administered 2013-04-25: 17:00:00 via INTRAVENOUS
  Filled 2013-04-25 (×3): qty 1000

## 2013-04-25 MED ORDER — HYDROMORPHONE HCL PF 1 MG/ML IJ SOLN: 0.5000 mg | INTRAMUSCULAR | Status: DC | PRN

## 2013-04-25 NOTE — Progress Notes (Addendum)
ANTIBIOTIC CONSULT NOTE - INITIAL  Pharmacy Consult for Tobramycin  Indication: UTI  Allergies  Allergen Reactions  . Penicillins Other (See Comments)    HIVES     Patient Measurements: Height: 6\' 2"  (188 cm) Weight: 282 lb (127.914 kg) IBW/kg (Calculated) : 82.2   Vital Signs: Temp: 97.4 F (36.3 C) (02/11 1405) Temp src: Oral (02/11 1405) BP: 171/87 mmHg (02/11 1405) Pulse Rate: 61 (02/11 1405) Intake/Output from previous day:   Intake/Output from this shift:    Labs:  Recent Labs  04/25/13 1505  WBC 5.9  HGB 13.7  PLT 126*  CREATININE 1.21   Estimated Creatinine Clearance: 68.1 ml/min (by C-G formula based on Cr of 1.21). No results found for this basename: VANCOTROUGH, VANCOPEAK, VANCORANDOM, GENTTROUGH, GENTPEAK, GENTRANDOM, TOBRATROUGH, TOBRAPEAK, TOBRARND, AMIKACINPEAK, AMIKACINTROU, AMIKACIN,  in the last 72 hours   Microbiology: No results found for this or any previous visit (from the past 720 hour(s)).  Medical History: Past Medical History  Diagnosis Date  . Hypertension   . BPH (benign prostatic hyperplasia)   . Hyperlipidemia   . Cancer 2008-2009    prostate TREATED WITH RADIATION  . AAA (abdominal aortic aneurysm) 04/2012    STENTING OF AAA IN [place]    Medications:  Scheduled:  . [START ON 04/26/2013] ciprofloxacin  400 mg Intravenous On Call  . docusate sodium  100 mg Oral BID   Infusions:  . dextrose 5 % and 0.45 % NaCl with KCl 20 mEq/L     PRN: acetaminophen, diphenhydrAMINE, diphenhydrAMINE, HYDROcodone-acetaminophen, HYDROmorphone (DILAUDID) injection, hyoscyamine, ondansetron, zolpidem Assessment:  80 yo M with large renal calculus.  He is scheduled for surgery with Dr. Jeffie Pollock on 2/11.  His pre-op urine culture grew proteus, sensitive to tobramycin.  Also sensitive to PCN and cephalosporin, however patient has anaphylaxis to PCN  Scr 1.21 with estimated CrCl 68 ml/min, AF, WBC WNL  Using adjusted body weight to dose (100  kg)  Goal of Therapy:  Tobramycin per renal function   Plan:  1.) Tobramycin 7 mg/kg extended dosing interval - 700 mg IV q24h  2.) Monitor renal function, tobramycin levels as needed 3.) Dr. Jeffie Pollock, please address duration of therapy when able so pharmacy aware of need for tobramycin levels.   Karrissa Parchment, Gaye Alken PharmD Pager #: [phone] 4:31 PM 04/25/2013

## 2013-04-25 NOTE — Progress Notes (Signed)
Dr. Jeffie Pollock notified of patient's arrival to the floor.

## 2013-04-25 NOTE — Consult Note (Signed)
HPI: [name] is an 80 y.o. male patient with a large (L) renal calculus. He is scheduled for surgery with Dr. Jeffie Pollock tomorrow but IR is requested to place nephroureteral access tube prior to procedure. He is on schedule with IR dept at 0930 on 2/12. Pt has been admitted today for pre-op preparation. PMHx and meds reviewed.  Past Medical History:  Past Medical History  Diagnosis Date  . Hypertension   . BPH (benign prostatic hyperplasia)   . Hyperlipidemia   . Cancer 2008-2009    prostate TREATED WITH RADIATION  . AAA (abdominal aortic aneurysm) 04/2012    STENTING OF AAA IN [place]    Past Surgical History:  Past Surgical History  Procedure Laterality Date  . Abdominal surgery      PART OF COLON REMOVED FOR DIVERTICULITIS  . Appendectomy    . Hernia repair    . Bladder surgery  2008    FOR BLADDER STONE  . Total knee arthroplasty  1990    left  . Abdominal aortic aneurysm repair  04/2012  . Total knee arthroplasty Right 07/26/2012    Procedure: RIGHT TOTAL KNEE ARTHROPLASTY;  Surgeon: Tobi Bastos, MD;  Location: [facility];  Service: Orthopedics;  Laterality: Right;    Family History:  Family History  Problem Relation Age of Onset  . Lung cancer Father     Social History:  reports that he quit smoking about 28 years ago. His smoking use included Cigarettes. He smoked 0.00 packs per day. He has never used smokeless tobacco. He reports that he does not drink alcohol or use illicit drugs.  Allergies:  Allergies  Allergen Reactions  . Penicillins Other (See Comments)    HIVES     Medications:   Medication List    ASK your doctor about these medications       acetaminophen 500 MG tablet  Commonly known as:  TYLENOL  Take 1,000 mg by mouth every 6 (six) hours as needed for mild pain or moderate pain.     aspirin 325 MG tablet  Take 325 mg by mouth daily.     doxazosin 8 MG tablet  Commonly known as:  CARDURA  Take 8 mg by mouth at bedtime.     simvastatin 80 MG tablet  Commonly known as:  ZOCOR  Take 80 mg by mouth every evening.     tolterodine 4 MG 24 hr capsule  Commonly known as:  DETROL LA  Take 4 mg by mouth 2 (two) times daily.        Please HPI for pertinent positives, otherwise complete 10 system ROS negative.  Physical Exam: Ht 6\' 2"  (1.88 m)  Wt 282 lb (127.914 kg)  BMI 36.19 kg/m2 Body mass index is 36.19 kg/(m^2).   General Appearance:  Alert, cooperative, no distress, appears stated age  Head:  Normocephalic, without obvious abnormality, atraumatic  ENT: Unremarkable airway  Lungs:   Clear to auscultation bilaterally, no w/r/r, respirations unlabored without use of accessory muscles.  Chest Wall:  No tenderness or deformity  Heart:  Regular rate and rhythm, S1, S2 normal, no murmur, rub or gallop.  Abdomen:   Soft, non-tender, non distended.  Extremities: Extremities normal, atraumatic, no cyanosis or edema  Pulses: 2+ and symmetric  Neurologic: Normal affect, no gross deficits.   No results found for this or any previous visit (from the past 48 hour(s)). No results found.  Assessment/Plan (L)renal calculus For percutaneous left nephroureteral access drain/tube tomorrow, prior  to operative procedure with Urology. Labs ordered, will review. Discussed IR part of procedure, risks, complications, use of moderate sedation Consent signed in chart  Ascencion Dike PA-C 04/25/2013, 2:41 PM

## 2013-04-25 NOTE — H&P (Addendum)
1. 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 Aneurysm Of The Abdominal Aorta (441.4)  3. Bilateral kidney stones (592.0)  4. Bladder neck contracture (596.0)  5. Erectile dysfunction due to arterial insufficiency (607.84)  6. H/O prostate cancer (V10.46)  7. Increased urinary frequency (788.41)  8. 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 Microscopic hematuria (599.72)  10. Pyuria (791.9)  11. Renal cyst, acquired (593.2)  12. Urinary urgency (788.63)  History of Present Illness        [name] returns today in f/u.  He has a history of a 3.42mm right renal stone and a 1.4cm LLP stone with microhematuria.  He thinks he passed a stone on the left about a month ago.   He has had no flank pain or gross hematuria.   he has no dysuria but he does have some urgency and UUI.   He has a history of a UTI in 2/14 and had another in 7/14 that was treated with fosfomycin but he doesn't think that helped, and his UA today looks infected.  He has a history of prostate cancer treated in 2008 with EXRT and his PSA has been rising slowly.  His PSA is 1.36 prior to this visit which is up from 1.15 in 7/14.   He has BPH with BOO and remains on detrol LA 4mg  2 daily and doxazosin.   He thinks he empties some of the time and he can have intermittancy but his stream is strong.   Past Medical History  1. 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 History of Aneurysm Of The Abdominal Aorta (441.4)  3. History of Bladder Calculus (V13.01)  4. H/O prostate cancer (V10.46)  5. History of diverticulitis of colon (V12.79)  6. History of hypercholesterolemia (V12.29)  7. History of hypertension (V12.59)  Surgical History  1. History of Appendectomy  2. History of Biopsy Of The Prostate Needle  3. History of Cataract Surgery  4. History of Endovascular Repair Of Aortic Aneurysm  5. History of Eye Surgery  6. History of Hemorrhoidectomy  7. History of Hernia Repair  8. History of  Intestinal Surgery  9. History of Knee Replacement  10. History of Lithotomy  11. History of Transurethral Resection Of Prostate (TURP)  Current Meds  1. Aspirin 81 MG Oral Tablet; 1 tab 3 x week;  Therapy: [id number] to Recorded  2. Cardura 4 MG Oral Tablet;  Therapy: [id number] to Recorded  3. Tolterodine Tartrate ER 4 MG Oral Capsule Extended Release 24 Hour; TAKE 2  CAPSULE qHS;  Therapy: [id number] to (Evaluate:13Jul2015)  Requested for: [phone]; Last  Rx:18Jul2014 Ordered  4. Zocor 40 MG Oral Tablet;  Therapy: [id number] to Recorded  Allergies  1. Penicillins  Family History  Negative   Social History  1. Denied: History of Alcohol Use  2. Marital History - Currently Married  3. Occupation:   RETIRED  4. Tobacco Use (V15.82)   SMOKED FOR 35 YRS AND QUIT OVER 22 YRS AGO   Past and social history reviewed and updated.   Review of Systems Genitourinary, constitutional, skin, eye, otolaryngeal, hematologic/lymphatic, cardiovascular, pulmonary, endocrine, musculoskeletal, gastrointestinal, neurological and psychiatric system(s) were reviewed and pertinent findings if present are noted.  Genitourinary: urinary frequency, feelings of urinary urgency, weak urinary stream, urinary stream starts and stops and incomplete emptying of bladder, but no hematuria.  Gastrointestinal: no flank pain, no diarrhea and no constipation.  Constitutional: no fever.  Musculoskeletal: joint pain (knee), but  no bone pain.    Vitals Vital Signs [Data Includes: Last 1 Day]  Recorded: [id number] 10:14AM  Weight: 272 lb  BMI Calculated: 34.92 BSA Calculated: 2.48 Blood Pressure: 111 / 83 Temperature: 98.4 F Heart Rate: 90  Physical Exam Constitutional: Well nourished and well developed . No acute distress.  ENT:. The ears and nose are normal in appearance.  Neck: The appearance of the neck is normal and no neck mass is present.  Pulmonary: No respiratory distress and normal respiratory  rhythm and effort.  Cardiovascular: Heart rate and rhythm are normal . No peripheral edema. A grade 2/6 systolic murmur was heard.  Abdomen: The abdomen is mildly obese. The abdomen is soft and nontender. No masses are palpated. No CVA tenderness. No hernias are palpable. No hepatosplenomegaly noted.  Rectal: Rectal exam demonstrates normal sphincter tone, no tenderness and no masses. The prostate is smooth and flat. The prostate has no nodularity and is not tender. The left seminal vesicle is nonpalpable. The right seminal vesicle is nonpalpable. The perineum is normal on inspection.  Genitourinary: Examination of the penis demonstrates no discharge, no masses, no lesions and a normal meatus. The scrotum is without lesions. The right epididymis is palpably normal and non-tender. The left epididymis is palpably normal and non-tender. The right testis is non-tender and without masses. The left testis is non-tender and without masses.  Lymphatics: The supraclavicular, axillary, femoral and inguinal nodes are not enlarged or tender.  Skin: Normal skin turgor, no visible rash and no visible skin lesions.  Neuro/Psych:. Mood and affect are appropriate. Normal sensation of the perineum/perianal region (S3,4,5).    Results/Data Urine [Data Includes: Last 1 Day]   [id number]  COLOR YELLOW   APPEARANCE CLOUDY   SPECIFIC GRAVITY >1.030   pH 6.0   GLUCOSE NEG mg/dL  BILIRUBIN NEG   KETONE NEG mg/dL  BLOOD LARGE   PROTEIN 30 mg/dL  UROBILINOGEN 0.2 mg/dL  NITRITE NEG   LEUKOCYTE ESTERASE LARGE   SQUAMOUS EPITHELIAL/HPF RARE   WBC TNTC WBC/hpf  RBC 11-20 RBC/hpf  BACTERIA FEW   CRYSTALS NONE SEEN   CASTS NONE SEEN   Other MUCUS    The following images/tracing/specimen were independently visualized:  KUB reviewed and his stone in the LLP is up to 2.1cm from 1.6cm in 2013. Prior CT reviewed as well.  The following clinical lab reports were reviewed:  UA and PSA reviewed. PSA Flowsheet [Data  Includes: Last 5 Years]   Goal [id number] [id number] [id number]  Free PSA         % Free PSA         PSA  1.36 ng/mL 1.15 ng/mL 1.05 ng/mL 0.54 ng/mL 0.52 ng/mL 0.55 ng/mL  Assessment  1. H/O prostate cancer (V10.46)  2. Pyuria (791.9)  3. Bilateral kidney stones (592.0)  4. Bladder neck contracture (596.0)  5. Urinary urgency (788.63)   He has a recurrent/persistent UTI. His PSA is up but only slightly. He has further enlargement of the LLP stone to 2.1cm and needs treatment.  He has increased urgency which may be related to the infection.   Plan Microscopic hematuria   1. UA With REFLEX; [Do Not Release]; Status:Resulted - Requires Verification;   [id number] 10:03AM Pyuria   2. URINE CULTURE; Status:Hold For - Specimen/Data Collection,Appointment; Requested  [id number];    Urine culture today. I will treat accordingly. I discussed ESWL and PCNL with him for the LLP stone and with the location, size and  infection, I think a perc is the best option and reviewed the risks of bleeding, infection, urine leak, renal and adjacent organ injury, need for a staged procedure, thrombotic events and anesthetic complications.  He will need medical clearance.  I will work on scheduling.   Discussion/Summary  CC: Dr. Trilby Drummer.   His preop urine culture grew proteus that is sensitive to pcn's, cephalosporins and tobramycin.   He has anaphyllaxis with PCN so he is to be admitted on 12/11 to begin tobramycin.

## 2013-04-26 ENCOUNTER — Observation Stay (HOSPITAL_COMMUNITY): Payer: Medicare Other

## 2013-04-26 ENCOUNTER — Observation Stay (HOSPITAL_COMMUNITY): Payer: Medicare Other | Admitting: Certified Registered Nurse Anesthetist

## 2013-04-26 ENCOUNTER — Encounter (HOSPITAL_COMMUNITY): Payer: Medicare Other | Admitting: Certified Registered Nurse Anesthetist

## 2013-04-26 ENCOUNTER — Encounter (HOSPITAL_COMMUNITY)
Admission: RE | Disposition: A | Discharge status: Skilled Nursing Facility | Payer: Self-pay | Source: Ambulatory Visit | Attending: Urology

## 2013-04-26 ENCOUNTER — Encounter (HOSPITAL_COMMUNITY): Payer: Self-pay | Admitting: Certified Registered Nurse Anesthetist

## 2013-04-26 ENCOUNTER — Ambulatory Visit (HOSPITAL_COMMUNITY)
Admission: RE | Admit: 2013-04-26 | Discharge: 2013-04-26 | Disposition: A | Discharge status: Home / Self Care | Payer: Medicare Other | Source: Ambulatory Visit | Attending: Urology | Admitting: Urology

## 2013-04-26 VITALS — BP 114/71 | HR 64 | Resp 22

## 2013-04-26 DIAGNOSIS — R001 Bradycardia, unspecified: Secondary | ICD-10-CM | POA: Diagnosis present

## 2013-04-26 DIAGNOSIS — I495 Sick sinus syndrome: Secondary | ICD-10-CM

## 2013-04-26 DIAGNOSIS — I714 Abdominal aortic aneurysm, without rupture, unspecified: Secondary | ICD-10-CM

## 2013-04-26 DIAGNOSIS — N200 Calculus of kidney: Secondary | ICD-10-CM

## 2013-04-26 DIAGNOSIS — I498 Other specified cardiac arrhythmias: Secondary | ICD-10-CM | POA: Diagnosis present

## 2013-04-26 HISTORY — PX: NEPHROLITHOTOMY: SHX5134

## 2013-04-26 LAB — HEMOGLOBIN AND HEMATOCRIT, BLOOD
HCT: 42 % (ref 39.0–52.0)
HEMOGLOBIN: 13.8 g/dL (ref 13.0–17.0)

## 2013-04-26 SURGERY — NEPHROLITHOTOMY PERCUTANEOUS
Anesthesia: General | Laterality: Left

## 2013-04-26 MED ORDER — PROPOFOL 10 MG/ML IV BOLUS
INTRAVENOUS | Status: AC
  Filled 2013-04-26: qty 20

## 2013-04-26 MED ORDER — LACTATED RINGERS IV SOLN
INTRAVENOUS | Status: DC
  Administered 2013-04-26: 08:00:00 via INTRAVENOUS

## 2013-04-26 MED ORDER — LACTATED RINGERS IV SOLN
INTRAVENOUS | Status: DC | PRN
  Administered 2013-04-26: 11:00:00 via INTRAVENOUS

## 2013-04-26 MED ORDER — ACETAMINOPHEN 325 MG PO TABS
650.0000 mg | ORAL_TABLET | ORAL | Status: DC | PRN
  Administered 2013-04-27 – 2013-04-28 (×2): 650 mg via ORAL
  Filled 2013-04-26 (×2): qty 2

## 2013-04-26 MED ORDER — FENTANYL CITRATE 0.05 MG/ML IJ SOLN
INTRAMUSCULAR | Status: AC
  Filled 2013-04-26: qty 6

## 2013-04-26 MED ORDER — FENTANYL CITRATE 0.05 MG/ML IJ SOLN
INTRAMUSCULAR | Status: DC | PRN
  Administered 2013-04-26: 100 ug via INTRAVENOUS

## 2013-04-26 MED ORDER — HYDROMORPHONE HCL PF 1 MG/ML IJ SOLN
0.5000 mg | INTRAMUSCULAR | Status: DC | PRN
  Administered 2013-04-26 – 2013-04-27 (×2): 0.5 mg via INTRAVENOUS
  Administered 2013-04-28 – 2013-05-04 (×7): 1 mg via INTRAVENOUS
  Filled 2013-04-26 (×9): qty 1

## 2013-04-26 MED ORDER — PROPOFOL 10 MG/ML IV BOLUS
INTRAVENOUS | Status: DC | PRN
  Administered 2013-04-26: 50 mg via INTRAVENOUS
  Administered 2013-04-26: 120 mg via INTRAVENOUS

## 2013-04-26 MED ORDER — PHENYLEPHRINE HCL 10 MG/ML IJ SOLN
INTRAMUSCULAR | Status: DC | PRN
  Administered 2013-04-26 (×6): 80 ug via INTRAVENOUS

## 2013-04-26 MED ORDER — DOCUSATE SODIUM 100 MG PO CAPS
100.0000 mg | ORAL_CAPSULE | Freq: Two times a day (BID) | ORAL | Status: DC
  Administered 2013-04-26 – 2013-05-14 (×33): 100 mg via ORAL
  Filled 2013-04-26 (×39): qty 1

## 2013-04-26 MED ORDER — SUCCINYLCHOLINE CHLORIDE 20 MG/ML IJ SOLN
INTRAMUSCULAR | Status: AC
  Filled 2013-04-26: qty 1

## 2013-04-26 MED ORDER — HYDROMORPHONE HCL PF 1 MG/ML IJ SOLN: 0.2500 mg | INTRAMUSCULAR | Status: DC | PRN

## 2013-04-26 MED ORDER — TOBRAMYCIN SULFATE 80 MG/2ML IJ SOLN
700.0000 mg | INTRAVENOUS | Status: DC
  Administered 2013-04-26: 700 mg via INTRAVENOUS
  Filled 2013-04-26 (×2): qty 17.5

## 2013-04-26 MED ORDER — LACTATED RINGERS IV SOLN: INTRAVENOUS | Status: DC

## 2013-04-26 MED ORDER — ONDANSETRON HCL 4 MG/2ML IJ SOLN
INTRAMUSCULAR | Status: DC | PRN
  Administered 2013-04-26: 4 mg via INTRAVENOUS

## 2013-04-26 MED ORDER — NEOSTIGMINE METHYLSULFATE 1 MG/ML IJ SOLN
INTRAMUSCULAR | Status: AC
  Filled 2013-04-26: qty 10

## 2013-04-26 MED ORDER — ONDANSETRON HCL 4 MG/2ML IJ SOLN
4.0000 mg | INTRAMUSCULAR | Status: DC | PRN
  Administered 2013-04-27 – 2013-05-04 (×2): 4 mg via INTRAVENOUS
  Filled 2013-04-26 (×3): qty 2

## 2013-04-26 MED ORDER — NEOSTIGMINE METHYLSULFATE 1 MG/ML IJ SOLN
INTRAMUSCULAR | Status: DC | PRN
  Administered 2013-04-26: 5 mg via INTRAVENOUS

## 2013-04-26 MED ORDER — IOHEXOL 300 MG/ML  SOLN
INTRAMUSCULAR | Status: DC | PRN
  Administered 2013-04-26: 10 mL via URETHRAL

## 2013-04-26 MED ORDER — MIDAZOLAM HCL 2 MG/2ML IJ SOLN
INTRAMUSCULAR | Status: AC | PRN
  Administered 2013-04-26: 1 mg via INTRAVENOUS

## 2013-04-26 MED ORDER — ROCURONIUM BROMIDE 100 MG/10ML IV SOLN
INTRAVENOUS | Status: AC
  Filled 2013-04-26: qty 1

## 2013-04-26 MED ORDER — MIDAZOLAM HCL 2 MG/2ML IJ SOLN
INTRAMUSCULAR | Status: AC
  Filled 2013-04-26: qty 6

## 2013-04-26 MED ORDER — DIPHENHYDRAMINE HCL 50 MG/ML IJ SOLN: 12.5000 mg | Freq: Four times a day (QID) | INTRAMUSCULAR | Status: DC | PRN

## 2013-04-26 MED ORDER — OXYCODONE-ACETAMINOPHEN 5-325 MG PO TABS
1.0000 | ORAL_TABLET | ORAL | Status: DC | PRN
  Administered 2013-04-27 (×2): 1 via ORAL
  Administered 2013-05-01 – 2013-05-08 (×8): 2 via ORAL
  Filled 2013-04-26: qty 2
  Filled 2013-04-26: qty 1
  Filled 2013-04-26 (×6): qty 2
  Filled 2013-04-26: qty 1
  Filled 2013-04-26: qty 2

## 2013-04-26 MED ORDER — GLYCOPYRROLATE 0.2 MG/ML IJ SOLN
INTRAMUSCULAR | Status: DC | PRN
  Administered 2013-04-26: .8 mg via INTRAVENOUS

## 2013-04-26 MED ORDER — FENTANYL CITRATE 0.05 MG/ML IJ SOLN
INTRAMUSCULAR | Status: AC
  Filled 2013-04-26: qty 5

## 2013-04-26 MED ORDER — LIDOCAINE HCL (CARDIAC) 20 MG/ML IV SOLN
INTRAVENOUS | Status: DC | PRN
  Administered 2013-04-26: 100 mg via INTRAVENOUS

## 2013-04-26 MED ORDER — LIDOCAINE HCL (CARDIAC) 20 MG/ML IV SOLN
INTRAVENOUS | Status: AC
  Filled 2013-04-26: qty 5

## 2013-04-26 MED ORDER — ONDANSETRON HCL 4 MG/2ML IJ SOLN
INTRAMUSCULAR | Status: AC
  Filled 2013-04-26: qty 2

## 2013-04-26 MED ORDER — ROCURONIUM BROMIDE 100 MG/10ML IV SOLN
INTRAVENOUS | Status: DC | PRN
  Administered 2013-04-26: 50 mg via INTRAVENOUS
  Administered 2013-04-26: 10 mg via INTRAVENOUS

## 2013-04-26 MED ORDER — SODIUM CHLORIDE 0.9 % IR SOLN
Status: DC | PRN
  Administered 2013-04-26: 5000 mL

## 2013-04-26 MED ORDER — KCL IN DEXTROSE-NACL 20-5-0.45 MEQ/L-%-% IV SOLN
INTRAVENOUS | Status: DC
  Administered 2013-04-26 – 2013-04-29 (×6): via INTRAVENOUS
  Filled 2013-04-26 (×7): qty 1000

## 2013-04-26 MED ORDER — CIPROFLOXACIN IN D5W 400 MG/200ML IV SOLN
INTRAVENOUS | Status: AC
  Filled 2013-04-26: qty 200

## 2013-04-26 MED ORDER — DIPHENHYDRAMINE HCL 12.5 MG/5ML PO ELIX
12.5000 mg | ORAL_SOLUTION | Freq: Four times a day (QID) | ORAL | Status: DC | PRN
  Administered 2013-04-30: 12.5 mg via ORAL
  Filled 2013-04-26 (×2): qty 5

## 2013-04-26 MED ORDER — ATORVASTATIN CALCIUM 40 MG PO TABS
40.0000 mg | ORAL_TABLET | Freq: Every day | ORAL | Status: DC
  Administered 2013-04-27 – 2013-05-13 (×17): 40 mg via ORAL
  Filled 2013-04-26 (×19): qty 1

## 2013-04-26 MED ORDER — DOXAZOSIN MESYLATE 8 MG PO TABS
8.0000 mg | ORAL_TABLET | Freq: Every day | ORAL | Status: DC
  Administered 2013-04-26 – 2013-05-13 (×18): 8 mg via ORAL
  Filled 2013-04-26 (×20): qty 1

## 2013-04-26 MED ORDER — SUCCINYLCHOLINE CHLORIDE 20 MG/ML IJ SOLN
INTRAMUSCULAR | Status: DC | PRN
  Administered 2013-04-26: 160 mg via INTRAVENOUS

## 2013-04-26 MED ORDER — GLYCOPYRROLATE 0.2 MG/ML IJ SOLN
INTRAMUSCULAR | Status: AC
  Filled 2013-04-26: qty 3

## 2013-04-26 MED ORDER — FESOTERODINE FUMARATE ER 4 MG PO TB24
4.0000 mg | ORAL_TABLET | Freq: Every day | ORAL | Status: DC
  Administered 2013-04-27 – 2013-04-28 (×2): 4 mg via ORAL
  Filled 2013-04-26 (×3): qty 1

## 2013-04-26 MED ORDER — EPHEDRINE SULFATE 50 MG/ML IJ SOLN
INTRAMUSCULAR | Status: DC | PRN
  Administered 2013-04-26: 5 mg via INTRAVENOUS
  Administered 2013-04-26 (×2): 10 mg via INTRAVENOUS

## 2013-04-26 MED ORDER — FENTANYL CITRATE 0.05 MG/ML IJ SOLN
INTRAMUSCULAR | Status: AC | PRN
  Administered 2013-04-26: 50 ug via INTRAVENOUS

## 2013-04-26 MED ORDER — BISACODYL 10 MG RE SUPP
10.0000 mg | Freq: Every day | RECTAL | Status: DC | PRN
  Administered 2013-05-04: 10 mg via RECTAL
  Filled 2013-04-26 (×2): qty 1

## 2013-04-26 SURGICAL SUPPLY — 47 items
APL SKNCLS STERI-STRIP NONHPOA (GAUZE/BANDAGES/DRESSINGS) ×2
BAG URINE DRAINAGE (UROLOGICAL SUPPLIES) ×1 IMPLANT
BASKET ZERO TIP NITINOL 2.4FR (BASKET) ×1 IMPLANT
BENZOIN TINCTURE PRP APPL 2/3 (GAUZE/BANDAGES/DRESSINGS) ×8 IMPLANT
BLADE SURG 15 STRL LF DISP TIS (BLADE) ×1 IMPLANT
BLADE SURG 15 STRL SS (BLADE) ×3
BSKT STON RTRVL ZERO TP 2.4FR (BASKET)
CARTRIDGE STONEBREAK CO2 KIDNE (ELECTROSURGICAL) ×4 IMPLANT
CATCHER STONE W/TUBE ADAPTER (UROLOGICAL SUPPLIES) ×1 IMPLANT
CATH BEACON 5.038 65CM KMP-01 (CATHETERS) ×2 IMPLANT
CATH FOLEY 2W COUNCIL 20FR 5CC (CATHETERS) ×2 IMPLANT
CATH ROBINSON RED A/P 20FR (CATHETERS) IMPLANT
CATH URET 5FR 28IN OPEN ENDED (CATHETERS) IMPLANT
CATH X-FORCE N30 NEPHROSTOMY (TUBING) ×3 IMPLANT
COVER SURGICAL LIGHT HANDLE (MISCELLANEOUS) ×3 IMPLANT
DRAPE C-ARM 42X120 X-RAY (DRAPES) ×3 IMPLANT
DRAPE CAMERA CLOSED 9X96 (DRAPES) ×3 IMPLANT
DRAPE LINGEMAN PERC (DRAPES) ×3 IMPLANT
DRAPE SURG IRRIG POUCH 19X23 (DRAPES) ×3 IMPLANT
DRSG TEGADERM 8X12 (GAUZE/BANDAGES/DRESSINGS) ×6 IMPLANT
FIBER LASER FLEXIVA 550 (UROLOGICAL SUPPLIES) IMPLANT
GLOVE SURG SS PI 8.0 STRL IVOR (GLOVE) IMPLANT
GOWN STRL REUS W/TWL XL LVL3 (GOWN DISPOSABLE) ×3 IMPLANT
GUIDEWIRE AMPLAZ .035X145 (WIRE) ×4 IMPLANT
KIT BASIN OR (CUSTOM PROCEDURE TRAY) ×3 IMPLANT
LASER FIBER DISP 1000U (UROLOGICAL SUPPLIES) IMPLANT
MANIFOLD NEPTUNE II (INSTRUMENTS) ×3 IMPLANT
NS IRRIG 1000ML POUR BTL (IV SOLUTION) ×1 IMPLANT
PACK BASIC VI WITH GOWN DISP (CUSTOM PROCEDURE TRAY) ×3 IMPLANT
PAD ABD 7.5X8 STRL (GAUZE/BANDAGES/DRESSINGS) ×6 IMPLANT
PROBE KIDNEY STONEBRKR 2.0X425 (ELECTROSURGICAL) ×2 IMPLANT
PROBE LITHOCLAST ULTRA 3.8X403 (UROLOGICAL SUPPLIES) ×1 IMPLANT
PROBE PNEUMATIC 1.0MMX570MM (UROLOGICAL SUPPLIES) ×1 IMPLANT
SET IRRIG Y TYPE TUR BLADDER L (SET/KITS/TRAYS/PACK) ×3 IMPLANT
SET WARMING FLUID IRRIGATION (MISCELLANEOUS) ×1 IMPLANT
SHEATH PEELAWAY SET 9 (SHEATH) ×2 IMPLANT
SPONGE GAUZE 4X4 12PLY (GAUZE/BANDAGES/DRESSINGS) ×3 IMPLANT
SPONGE LAP 4X18 X RAY DECT (DISPOSABLE) ×3 IMPLANT
STONE CATCHER W/TUBE ADAPTER (UROLOGICAL SUPPLIES) IMPLANT
SUT SILK 2 0 30  PSL (SUTURE) ×2
SUT SILK 2 0 30 PSL (SUTURE) ×1 IMPLANT
SYR 20CC LL (SYRINGE) ×6 IMPLANT
SYRINGE 10CC LL (SYRINGE) ×3 IMPLANT
TOWEL OR NON WOVEN STRL DISP B (DISPOSABLE) ×3 IMPLANT
TRAY FOLEY CATH 14FRSI W/METER (CATHETERS) ×1 IMPLANT
TUBING CONNECTING 10 (TUBING) ×6 IMPLANT
TUBING CONNECTING 10' (TUBING) ×3

## 2013-04-26 NOTE — Anesthesia Postprocedure Evaluation (Signed)
  Anesthesia Post-op Note  Patient: [name]  Procedure(s) Performed: Procedure(s) (LRB): LEFT PERCUTANEOUS NEPHROLITHOTOMY  (Left)  Patient Location: PACU  Anesthesia Type: General  Level of Consciousness: awake and alert   Airway and Oxygen Therapy: Patient Spontanous Breathing  Post-op Pain: mild  Post-op Assessment: Post-op Vital signs reviewed, Patient's Cardiovascular Status Stable, Respiratory Function Stable, Patent Airway and No signs of Nausea or vomiting  Last Vitals:  Filed Vitals:   04/26/13 1355  BP:   Pulse: 78  Temp:   Resp: 21    Post-op Vital Signs: stable   Complications: No apparent anesthesia complications

## 2013-04-26 NOTE — Preoperative (Signed)
Beta Blockers   Reason not to administer Beta Blockers:Not Applicable 

## 2013-04-26 NOTE — Progress Notes (Signed)
Patient ID: [name], male   DOB: 04/29/1929, 80 y.o.   MRN: [id number]   [name] had bradycardia with episodes of a junctional rhythm in the PACU and a cardiology consult was obtained.   He is now in sinus tach without symptoms.   His post op hgb was stable from preop.   His NT drainage is bloody but the foley drainage is less so.   I will get a CT without contrast in the AM to assess for residual fragments and if that is negative, I will plan to get a nephrostogram and possible tube removal on Saturday.   He will stay on tobramycin but will not need antibiotics post discharge per my conversation today with ID.

## 2013-04-26 NOTE — Progress Notes (Signed)
Dr. Landry Dyke  Made aware of patient's condition and that patient will be going to [facility] per Dr. Jeffie Pollock.

## 2013-04-26 NOTE — Progress Notes (Signed)
UR completed 

## 2013-04-26 NOTE — Procedures (Signed)
66f antegrade Left nephroureteral placed LLP to bladder under fluoro No complication No blood loss. See complete dictation in [facility].

## 2013-04-26 NOTE — Brief Op Note (Signed)
04/25/2013 - 04/26/2013  12:36 PM  PATIENT:  [name]  80 y.o. male  PRE-OPERATIVE DIAGNOSIS:  Left 2.1cm renal stone  POST-OPERATIVE DIAGNOSIS:  Left 2.1cm renal stone  PROCEDURE:  Procedure(s): 1st Stage LEFT PERCUTANEOUS NEPHROLITHOTOMY >2cm  (Left)  SURGEON:  Surgeon(s) and Role:    * [name], MD - Primary  PHYSICIAN ASSISTANT:   ASSISTANTS: none   ANESTHESIA:   general  EBL:  Total I/O In: -  Out: 340 [Urine:340]  BLOOD ADMINISTERED:none  DRAINS: Urinary Catheter (Foley) and Left 32fr council nephrostomy tube and 18fr Kompy safety catheter   LOCAL MEDICATIONS USED:  NONE  SPECIMEN:  Source of Specimen:  left renal stones  DISPOSITION OF SPECIMEN:  to family to bring to office for analysis  COUNTS:  YES  TOURNIQUET:  * No tourniquets in log *  DICTATION: .Other Dictation: Dictation Number [id number]?  PLAN OF CARE: Admit for overnight observation  PATIENT DISPOSITION:  PACU - hemodynamically stable.   Delay start of Pharmacological VTE agent (>24hrs) due to surgical blood loss or risk of bleeding: yes

## 2013-04-26 NOTE — Transfer of Care (Signed)
Immediate Anesthesia Transfer of Care Note  Patient: [name]  Procedure(s) Performed: Procedure(s) (LRB): LEFT PERCUTANEOUS NEPHROLITHOTOMY  (Left)  Patient Location: PACU  Anesthesia Type: General  Level of Consciousness: sedated, patient cooperative and responds to stimulation  Airway & Oxygen Therapy: Patient Spontanous Breathing and Patient connected to face mask oxgen  Post-op Assessment: Report given to PACU RN and Post -op Vital signs reviewed and stable  Post vital signs: Reviewed and stable  Complications: No apparent anesthesia complications

## 2013-04-26 NOTE — Progress Notes (Signed)
Dr. Landry Dyke made aware of patient's heart rates being as low as 39-41-blood pressures stable

## 2013-04-26 NOTE — Progress Notes (Signed)
Hgb. 13.8 Hct. 42.0 results noted.

## 2013-04-26 NOTE — Consult Note (Signed)
Admit date: 04/25/2013 Referring Physician  Dr. Jeffie Pollock Primary Physician  Dr. Trilby Drummer Primary Cardiologist   New- [place] Reason for Consultation  bradycardia  HPI: 80 year old man who had urologic surgery today. In the PACU, he developed a junctional rhythm. When the ECG was obtained, it showed an accelerated junctional rhythm. There were also episodes of sinus bradycardia, with rates down into the 30s. He recalls that at that time, he was having some pain related to the procedure today. He had some nausea. He denies any chest pain, dizziness or syncope. Even prior to the operation, he has not had dizziness or syncope. He notes that his heart rate was slow when he came to be admitted before the surgery. His daughter states that his heart rate was 49.  He has an extensive vascular history. He has had an iliac aneurysm. He apparently has an abdominal aortic aneurysm as well, treated by Dr. Katy Apo. This is being monitored.  At the time I saw him, his bradycardia had recovered. He had his rhythm of sinus tachycardia with heart rates around 100.    PMH:   Past Medical History  Diagnosis Date  . Hypertension   . BPH (benign prostatic hyperplasia)   . Hyperlipidemia   . Cancer 2008-2009    prostate TREATED WITH RADIATION  . AAA (abdominal aortic aneurysm) 04/2012    STENTING OF AAA IN [place]     PSH:   Past Surgical History  Procedure Laterality Date  . Abdominal surgery      PART OF COLON REMOVED FOR DIVERTICULITIS  . Appendectomy    . Hernia repair    . Bladder surgery  2008    FOR BLADDER STONE  . Total knee arthroplasty  1990    left  . Abdominal aortic aneurysm repair  04/2012  . Total knee arthroplasty Right 07/26/2012    Procedure: RIGHT TOTAL KNEE ARTHROPLASTY;  Surgeon: Tobi Bastos, MD;  Location: [facility];  Service: Orthopedics;  Laterality: Right;    Allergies:  Penicillins Prior to Admit Meds:   Prescriptions prior to admission  Medication Sig Dispense  Refill  . acetaminophen (TYLENOL) 500 MG tablet Take 1,000 mg by mouth every 6 (six) hours as needed for mild pain or moderate pain.      Marland Kitchen aspirin 325 MG tablet Take 325 mg by mouth daily.      Marland Kitchen doxazosin (CARDURA) 8 MG tablet Take 8 mg by mouth at bedtime.       . Naphazoline-Glycerin-Zinc Sulf (CLEAR EYES MAXIMUM ITCHY EYE OP) Place 2 drops into the left eye as needed (for reddness in eye).      . simvastatin (ZOCOR) 80 MG tablet Take 80 mg by mouth every evening.      . tolterodine (DETROL LA) 4 MG 24 hr capsule Take 4 mg by mouth 2 (two) times daily.        Fam HX:    Family History  Problem Relation Age of Onset  . Lung cancer Father    Social HX:    History   Social History  . Marital Status: Married    Spouse Name: N/A    Number of Children: N/A  . Years of Education: N/A   Occupational History  . Not on file.   Social History Main Topics  . Smoking status: Former Smoker    Types: Cigarettes    Quit date: 03/15/1985  . Smokeless tobacco: Never Used  . Alcohol Use: No  . Drug Use:  No  . Sexual Activity: No   Other Topics Concern  . Not on file   Social History Narrative  . No narrative on file     ROS:  All 11 ROS were addressed and are negative except what is stated in the HPI  Physical Exam: Blood pressure 137/95, pulse 103, temperature 97.8 F (36.6 C), temperature source Oral, resp. rate 24, height 6\' 2"  (1.88 m), weight 279 lb 1.6 oz (126.6 kg), SpO2 97.00%.    General: Well developed, well nourished, in no acute distress Head: Eyes PERRLA, No xanthomas.   Normal cephalic and atramatic  Lungs:   Clear bilaterally to auscultation and percussion. Heart:   HRRR S1 S2 No JVD.   Abdomen: abdomen soft and non-tender Msk:   Normal strength and tone for age. Extremities:  no edema. Neuro: Alert and oriented X 3. Psych:  Normal affect, responds appropriately    Labs:   Lab Results  Component Value Date   WBC 5.9 04/25/2013   HGB 13.8 04/26/2013   HCT  42.0 04/26/2013   MCV 85.5 04/25/2013   PLT 126* 04/25/2013    Recent Labs Lab 04/25/13 1505  NA 140  K 4.8  CL 103  CO2 28  BUN 20  CREATININE 1.21  CALCIUM 9.5  GLUCOSE 89   No results found for this basename: PTT   Lab Results  Component Value Date   INR 1.05 04/25/2013   INR 1.03 07/18/2012   INR 1.24 05/20/2012   No results found for this basename: CKTOTAL, CKMB, CKMBINDEX, TROPONINI     No results found for this basename: CHOL   No results found for this basename: HDL   No results found for this basename: LDLCALC   No results found for this basename: TRIG   No results found for this basename: CHOLHDL   No results found for this basename: LDLDIRECT      Radiology:  Ir Oris Drone Cath Perc Left  04/26/2013   CLINICAL DATA:  Left lower pole calculus , pre percutaneous intraoperative nephrolithotomy  EXAM: LEFT PERCUTANEOUS NEPHRO-URETERAL CATHETER PLACEMENT UNDER FLUOROSCOPIC GUIDANCE  TECHNIQUE: The procedure, risks (including but not limited to bleeding, infection, organ damage ), benefits, and alternatives were explained to the patient. Questions regarding the procedure were encouraged and answered. The patient understands and consents to the procedure. The leftflank region prepped with Betadine, draped in usual sterile fashion, infiltrated locally with 1% lidocaine.As antibiotic prophylaxis, ciprofloxacin 400 mg was ordered pre-procedure and administered intravenously within one hour of incision.  Intravenous Fentanyl and Versed were administered as conscious sedation during continuous cardiorespiratory monitoring by the radiology RN, with a total moderate sedation time of 20 minutes.  Under real-time ultrasound guidance, a 21-gauge trocar needle was advanced into a posterior lower pole calyx using the calculus as a guide. A 018 inch guidewire was advanced into the proximal ureter. Needle exchanged for 3 French dilator allowing contrast injection, confirming appropriate  positioning. The dilator was exchanged over the 018 wire for the transitional dilator, which allowed passage of the 035 Bentson wire down the ureter. Over this, a 5 [place] angiographic catheter was advanced into the urinary bladder. Catheter was capped externally and secured to the skin with an 0 silk suture. The patient tolerated the procedure well. No immediate complication.  FLUOROSCOPY TIME:  5 MIN 0 seconds  IMPRESSION: 1. Technically successful left percutaneous nephro-ureteral catheter placement for planned intraoperative procedure.   Electronically Signed   By: Delories Heinz.D.  On: 04/26/2013 15:17    EKG:  Junctional rhythm, early RS transition, no significant ST segment changes, prolonged QT interval  ASSESSMENT: Intermittent bradycardia postoperatively, peripheral vascular disease, hyperlipidemia  PLAN:  Watch on telemetry. He'll be going to the step down unit. I suspect his bradycardia was related to pain and a vasovagal type reaction.  As long as he is asymptomatic, we can continue to monitor without any further invasive management. He has not had any symptoms of bradycardia even before this.  I long conversation with [name], the patient's daughter. I explained that we need to watch for symptoms of dizziness or syncope. Those potentially could be indication for pacemaker if they're connected to low heart rates. I would not be surprised if he had slow heart rates at night while asleep. Nighttime bradycardia would not concern me as much.  Peripheral vascular disease: He follows at [facility] with Dr. Sammuel Hines. He has a high likelihood of coronary artery disease. I do not think that his current rhythm issues are related to ischemia. He has not had any symptoms of angina. Continue aggressive medical therapy for his peripheral vascular disease as this will be beneficial for heart health as well.  Hyperlipidemia: Continue simvastatin.  The patient's daughter would like him to have cardiology  followup at discharge. I think this is very reasonable.  Jettie Booze., MD  04/26/2013  6:14 PM

## 2013-04-26 NOTE — Progress Notes (Signed)
Dr. Jeffie Pollock made aware of patient's condition - Orders given

## 2013-04-26 NOTE — Discharge Instructions (Addendum)
Percutaneous Nephrolithotomy Kidney stones can cause a great deal of pain. They can block urine from leaving the body. And they can lead to infection. Kidney stones might pass on their own, or they can be broken up by shock waves from a special machine. But sometimes surgery is needed to get rid of kidney stones. One type of surgery is called percutaneous nephrolithotomy. "Nephro" means kidney, "litho" means stone and "tomy" means removal by surgery. "Percutaneous" means through the skin. This type of surgery needs only a small cut (incision) in the skin. This surgery may be suggested for various reasons. They include:  The kidney stones are 2 centimeters wide (about 3/4 inch) or bigger. They also might be oddly shaped.  Other treatments were tried, but all or some of the kidney stones remain.  Infection has developed.  No other treatment can be used. LET YOUR CAREGIVER KNOW ABOUT:   Any allergies.  All medications you are taking, including:  Herbs, eyedrops, over-the-counter medications and creams.  Blood thinners (anticoagulants), aspirin or other drugs that could affect blood clotting.  Use of steroids (by mouth or as creams).  Previous problems with anesthetics, including local anesthetics.  Possibility of pregnancy, if this applies.  Any history of blood clots.  Any history of bleeding or other blood problems.  Previous surgery.  Smoking history.  Other health problems. RISKS AND COMPLICATIONS   During surgery:  Sometimes all the kidney stones cannot be removed through the tube. Then, the percutaneous nephrolithotomy procedure would be stopped. Open surgery would be used to remove the remaining stones.  Short-term risks from the surgery could include:  Excessive bleeding.  Blood in your urine.  Holes in the kidney. These usually heal on their own.  Pain.  Redness or tenderness at the incision site.  Numbness (loss of feeling) in the area treated. Tingling  also is possible.  A pooling of blood in the wound (hematoma).  Infection.  Slow healing.  Longer-term possibilities include:  Kidney damage.  Damage to organs near the kidney.  Need for a repeat surgery. BEFORE THE PROCEDURE  You may need to take some tests before your surgery. These might include:  Blood tests.  Urine tests.  Tests to make sure your heart is working properly.  Let your healthcare provider know if you think you might have a urinary tract infection. You will probably need to take an antibiotic to treat this before the surgery.  Two weeks before your surgery, stop using aspirin and non-steroidal anti-inflammatory drugs (NSAIDs) for pain relief. This includes prescription drugs and over-the-counter drugs such as ibuprofen and naproxen. Also stop taking vitamin E.  If you take blood-thinners, ask your healthcare provider when you should stop taking them.  Do not eat or drink for about 8 hours before your surgery.  You might be asked to shower or wash with a special antibacterial soap before the procedure.  Arrive at least an hour before the surgery, or whenever your surgeon recommends. This will give you time to check in and fill out any needed paperwork. PROCEDURE  The preparation:  You will change into a hospital gown.  You will be given an IV. A needle will be inserted in your arm. Medication will be able to flow directly into your body through this needle.  You might be given a sedative. This medication will help you relax.  You may be given a general anesthetic (a drug that will put you to sleep during the surgery). Or, you may  get a local anesthetic (part of your body will be numb, but you will remain awake).  A catheter (tube) will be put in your bladder to drain urine during and after surgery.  The procedure:  The surgeon will make a small incision in your lower back.  A tube will be inserted through the incision into your kidney.  Each  kidney stone is removed through this tube. Larger stones may first be broken up with a laser (a high-intensity light beam) or other tools.  If a kidney stone has already left the kidney, the surgeon would use a special tool to bring it back in. Then it would be removed through the tube.  After all the stones are taken out, a catheter will be put in. Fluid can build up around the kidney as it heals. The catheter lets this fluid drain out of the body.  A dressing (medicine and bandage) will be put on the incision area.  The surgery usually takes three to four hours. AFTER THE PROCEDURE  You will stay in a recovery area until the anesthesia has worn off. Your blood pressure and pulse will be checked often. You might be given more pain medication.  You will be moved to a hospital room for the rest of your stay.  The catheter that is taking urine out of your body will be taken out within 24 hours.  The day after your surgery, you should be able to walk around. Walking helps prevent blood clots (thick clumps that can block the flow of blood).  You may be asked to do some breathing exercises.  You will be able to have only liquids for a day or two.  Before you go home:  The catheter that is draining fluid from the kidney area is usually taken out.  You will be taught how to care for the incision. Be sure to ask how often the dressing should be changed and when it can get wet.  You also will be told what you should and should not do while your kidney and incisions heal. For instance, you may be urged to walk to prevent blood clots. Document Released: 12/27/2008 Document Revised: 05/24/2011 Document Reviewed: 12/27/2008 [facility] Patient Information 2014 [place], [place]. Inferior Vena Cava Filter Insertion Insertion of an inferior vena cava (IVC) filter is a procedure in which a filter is placed into the large vein in your abdomen that carries blood from the lower part of your body to your  heart (inferior vena cava). Placement of the filter here helps prevent blood clots in the legs or pelvis from traveling to your lungs. A large blood clot in the lungs can cause death.  The filter is a small, metal device about an inch long. It is shaped like the spokes of an umbrella and is inserted through a pathway created in your neck or groin. The risks of this procedure are usually small and easily managed. Inferior vena cava filters are only used when blood thinners (anticoagulants) cannot be used to prevent blood clots from forming. This may occur because of:  You have severe platelet problems or shortage.  You have had recent or current major bleeding that cannot be treated.  You have bleeding associated with anticoagulants.  You have recurrence of blood clots while on anticoagulants.  You have a need for surgery in the near future.  You have bleeding in your head. EXPECTATIONS OF A FILTER  An IVC filter will reduce the risk of a large blood  clot making its way to your lungs (pulmonary embolus, or PE). It cannot eliminate the risk completely, or prevent small PEs from occurring.  It does not stop blood clots from growing, recurring, or developing into postphlebitic syndrome. This is a condition that can occur after there is inflammation in a vein (phlebitis). LET [facility] CARE PROVIDER KNOW ABOUT:  Any allergies you have. This includes an allergy to iodine or contrast dye.  All medicines you are taking, including vitamins, herbs, eye drops, creams and over-the-counter medicines.  Previous problems you or members of your family have had with the use of anesthetics.  Any blood disorders you have.  Previous surgeries you had had.  Medical conditions you have.  Possibility of pregnancy, if this applies. RISKS AND COMPLICATIONS Generally, this is a safe procedure. However, as with any procedure, problems can occur. Possible problems include:  A small bruise around the  needle insertion site. A larger pooling of blood called a hematoma may form. This is usually of no concern.  The filter can block the vena cava. This can cause some swelling of the legs.  The filter may eventually fail and not work properly.  Continued bleeding or infections (uncommon).  Damage to the vein by the catheter (rare). BEFORE THE PROCEDURE  Your health care provider may want you to have blood tests. These tests can help tell how well your kidneys and liver are working. They can also show how well your blood clots.  If you take blood thinners, ask your health care provider if and when you should stop taking them.  Do not eat or drink for 4 hours before the procedure or as directed by your health care provider.  Make arrangements for someone to drive you home. Depending on the procedure, you may be able to go home the same day.  PROCEDURE   The procedure usually takes about 30 minutes to 1 hour. This can vary.  An IV needle will be inserted into one of your veins. Medicine will be able to flow directly into your body through this needle.  Medicines may given to help you relax and relieve anxiety (sedative).  The procedure is done through a large vein either in your neck or groin. The skin around this area is cleaned and shaved, as necessary.  The skin and deeper tissues over the vein will be made numb with a local anesthetic. You are awake during the procedure and can let your health care providers know if you have discomfort.  A needle is then put into the vein. A guide wire is placed through the needle and into the vein. This is used to help insert a catheter and the IVC filter into your vein.  Contrast dye may be injected into the inferior vena cava to help guide the catheter and verify precise placement of the IVC filter. X-ray equipment may also be used to verify that the catheter and the wire are in the correct position.  The wire is then withdrawn.  The IVC filter  is then passed over the catheter into the vein, and inserted into the correct location in the vena cava.  The catheter is then removed. Pressure will be kept on the needle insertion point for several minutes or until it is unlikely to bleed. AFTER THE PROCEDURE  You will stay in a recovery area until any sedation medicine you were given has worn off. Your blood pressure and pulse will be checked.  If there are no problems, you  should be able to go home after the procedure.  You may feel sore at the area of the needle insertion for a few days. Document Released: 04/21/2005 Document Revised: 12/20/2012 Document Reviewed: 11/06/2012 [facility] Patient Information 2014 [place].   Coumadin: Would recommend home dose to be 5mg  (1 tablet) daily  INR should be checked Tues (3/4) and Fri (3/7) next week for continued dosing

## 2013-04-26 NOTE — Progress Notes (Signed)
Hgb. And Hct drawn by lab

## 2013-04-26 NOTE — Progress Notes (Signed)
Dr. Landry Dyke shown EKG copy

## 2013-04-26 NOTE — Progress Notes (Signed)
12 Lead EKG done.

## 2013-04-26 NOTE — Anesthesia Preprocedure Evaluation (Addendum)
Anesthesia Evaluation  Patient identified by MRN, date of birth, ID band Patient awake    Reviewed: Allergy & Precautions, H&P , NPO status , Patient's Chart, lab work & pertinent test results  Airway Mallampati: II TM Distance: >3 FB Neck ROM: full    Dental  (+) Edentulous Upper, Edentulous Lower   Pulmonary neg pulmonary ROS, former smoker,  breath sounds clear to auscultation  Pulmonary exam normal       Cardiovascular Exercise Tolerance: Good hypertension, Pt. on medications Rhythm:regular Rate:Normal  AAA stenting   Neuro/Psych Weakness right leg negative neurological ROS  negative psych ROS   GI/Hepatic negative GI ROS, Neg liver ROS,   Endo/Other  negative endocrine ROS  Renal/GU negative Renal ROS  negative genitourinary   Musculoskeletal   Abdominal   Peds  Hematology negative hematology ROS (+)   Anesthesia Other Findings   Reproductive/Obstetrics negative OB ROS                          Anesthesia Physical Anesthesia Plan  ASA: III  Anesthesia Plan: General   Post-op Pain Management:    Induction: Intravenous  Airway Management Planned: Oral ETT  Additional Equipment:   Intra-op Plan:   Post-operative Plan: Extubation in OR  Informed Consent: I have reviewed the patients History and Physical, chart, labs and discussed the procedure including the risks, benefits and alternatives for the proposed anesthesia with the patient or authorized representative who has indicated his/her understanding and acceptance.   Dental Advisory Given  Plan Discussed with: CRNA and Surgeon  Anesthesia Plan Comments:         Anesthesia Quick Evaluation

## 2013-04-27 ENCOUNTER — Inpatient Hospital Stay (HOSPITAL_COMMUNITY): Payer: Medicare Other

## 2013-04-27 ENCOUNTER — Encounter (HOSPITAL_COMMUNITY): Payer: Self-pay | Admitting: Urology

## 2013-04-27 DIAGNOSIS — N390 Urinary tract infection, site not specified: Secondary | ICD-10-CM | POA: Diagnosis present

## 2013-04-27 LAB — BASIC METABOLIC PANEL
BUN: 21 mg/dL (ref 6–23)
CO2: 25 meq/L (ref 19–32)
Calcium: 8.8 mg/dL (ref 8.4–10.5)
Chloride: 99 mEq/L (ref 96–112)
Creatinine, Ser: 1.29 mg/dL (ref 0.50–1.35)
GFR calc Af Amer: 58 mL/min — ABNORMAL LOW (ref >90)
GFR, EST NON AFRICAN AMERICAN: 50 mL/min — AB (ref >90)
GLUCOSE: 153 mg/dL — AB (ref 70–99)
POTASSIUM: 4.6 meq/L (ref 3.7–5.3)
SODIUM: 135 meq/L — AB (ref 137–147)

## 2013-04-27 LAB — TOBRAMYCIN LEVEL, RANDOM: Tobramycin Rm: 6.9 ug/mL

## 2013-04-27 LAB — HEMOGLOBIN AND HEMATOCRIT, BLOOD
HEMATOCRIT: 41.5 % (ref 39.0–52.0)
Hemoglobin: 13.7 g/dL (ref 13.0–17.0)

## 2013-04-27 MED ORDER — TRAMADOL HCL 50 MG PO TABS
50.0000 mg | ORAL_TABLET | Freq: Four times a day (QID) | ORAL | Status: DC | PRN
  Administered 2013-05-04 (×2): 50 mg via ORAL
  Filled 2013-04-27 (×2): qty 1

## 2013-04-27 MED ORDER — DEXTROSE 5 % IV SOLN
1.0000 g | INTRAVENOUS | Status: DC
  Administered 2013-04-28 – 2013-05-02 (×4): 1 g via INTRAVENOUS
  Filled 2013-04-27 (×6): qty 10

## 2013-04-27 MED ORDER — SODIUM CHLORIDE 0.9 % IV BOLUS (SEPSIS)
500.0000 mL | Freq: Once | INTRAVENOUS | Status: AC
  Administered 2013-04-28: 500 mL via INTRAVENOUS

## 2013-04-27 NOTE — Progress Notes (Signed)
Patient's output for this evening was 125 ml,  dark red urine. Nephrostomy output was 5 ml.  The answering service for the MD on call was notified by telephone.  Awaiting a response.

## 2013-04-27 NOTE — Progress Notes (Signed)
       Patient Name: [name] Date of Encounter: 04/27/2013    SUBJECTIVE: No cardiovascular symptoms.  TELEMETRY:  Review of telemetry demonstrates first degree AV block with sinus rhythm and rare bradycardia since on telemetry. He has occasional junctional rhythm. No high-grade AV block of any significance is noted. Filed Vitals:   04/26/13 2105 04/26/13 2200 04/27/13 0000 04/27/13 0400  BP: 104/74 111/71 91/61 107/63  Pulse: 112 112 104 98  Temp: 98.2 F (36.8 C)  98.1 F (36.7 C) 97.4 F (36.3 C)  TempSrc: Oral  Oral Oral  Resp: 27 23 21 20   Height:      Weight:    280 lb 13.9 oz (127.4 kg)  SpO2: 97% 97% 97% 96%    Intake/Output Summary (Last 24 hours) at 04/27/13 0657 Last data filed at 04/27/13 0600  Gross per 24 hour  Intake   2017 ml  Output   1370 ml  Net    647 ml    LABS: Basic Metabolic Panel:  Recent Labs  04/25/13 1505 04/27/13 0345  NA 140 135*  K 4.8 4.6  CL 103 99  CO2 28 25  GLUCOSE 89 153*  BUN 20 21  CREATININE 1.21 1.29  CALCIUM 9.5 8.8   CBC:  Recent Labs  04/25/13 1505 04/26/13 1325 04/27/13 0345  WBC 5.9  --   --   HGB 13.7 13.8 13.7  HCT 42.9 42.0 41.5  MCV 85.5  --   --   PLT 126*  --   --     Radiology/Studies:   no new data   Physical Exam: Blood pressure 107/63, pulse 98, temperature 97.4 F (36.3 C), temperature source Oral, resp. rate 20, height 6\' 2"  (1.88 m), weight 280 lb 13.9 oz (127.4 kg), SpO2 96.00%. Weight change: -2 lb 14.4 oz (-1.314 kg)    1 of 6 systolic murmur. Neck veins are flat Chest is clear  ASSESSMENT:   1. 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 Significant bradycardia post procedure possibly related to vagal mechanism versus underlying sinus node dysfunction.  Plan:   1. Avoid medications that can block the AV node or depressed sinus node function   2. There is certainly no rhythm  associated with symptoms are that will qualify for permanent pacemaker implantation 3. May consider outpatient 30 day continuous monitor to get a broader view of his overall rhythm generation. 4. No further inpatient evaluation seems necessary  Demetrios Isaacs 04/27/2013, 6:57 AM

## 2013-04-27 NOTE — Progress Notes (Signed)
Pt bladder scanned and 34 ml found

## 2013-04-27 NOTE — Progress Notes (Signed)
Report called to Levada Dy, RN for transfer to [phone].

## 2013-04-27 NOTE — Progress Notes (Signed)
Patient ID: [name], male   DOB: November 13, 1929, 80 y.o.   MRN: [id number]  [name] has had little pain today.   His urine remains bloody despite a stable Hgb.   He is having urgency with incontinence since the foley was removed and now has a condom cath.   The CT today showed several residual stone fragments up to 12mm in the lower pole with a few smaller fragments adjacent to the tube in the renal pelvis.   The tube is in good position.  There is mild to moderate perinephric stranding.   I will have a PVR checked and replace the foley if it is greater than 200cc.   I have cancelled the nephrostogram for tomorrow and have scheduled him for a second look next Thursday to retrieve the residual fragments.  He was changed to rocephin per pharmacy recommendation since he tolerated that in the past be their records.

## 2013-04-27 NOTE — Progress Notes (Signed)
Cardiology PA notified pt once again having juctional, SB, 1st degree HB rhythms w/ HR falling to 30 BPM; Pt is asymptomatic, B/P 105/69.  RN continuing to monitor.

## 2013-04-27 NOTE — Op Note (Signed)
NAME:  TIELER, COURNOYER NO.:  [id number]  MEDICAL RECORD NO.:  [id number]  LOCATION:  XRAY                         FACILITY:  [facility] ([place])  PHYSICIAN:  Marshall Cork. Jeffie Pollock, M.D.    DATE OF BIRTH:  1930/01/26  DATE OF PROCEDURE:  04/26/2013 DATE OF DISCHARGE:  04/26/2013                              OPERATIVE REPORT   PROCEDURE: 1st Stage Left percutaneous nephrolithotomy for 2.1-cm stone.  PREOPERATIVE DIAGNOSIS:  A 2.1-cm left renal stone.  POSTOPERATIVE DIAGNOSIS:  A 2.1-cm left renal stone.  SURGEON:  Marshall Cork. Jeffie Pollock, MD  ANESTHESIA:  General.  SPECIMEN:  Stone fragments.  DRAINS:  Left 20-French Councill catheter nephrostomy tube and 6-French Kumpe safety catheter along with a Foley catheter.  ESTIMATED BLOOD LOSS:  200 mL.  COMPLICATIONS:  None.  INDICATIONS:  Mr. Selover is an 80 year old white male with a history of nephrolithiasis.  He was found on recent evaluation to have metabolically active stone disease with close to his left renal stone from 1.3 to 2.1 cm over approximately a year.  He also has a Proteus urinary tract infection, and was admitted the day prior to surgery because of his history of anaphylaxis with penicillin and the need for tobramycin for coverage of the Proteus.  FINDINGS AND PROCEDURE:  He was admitted the day before and given tobramycin per pharmacy.  The morning of surgery, he was taken to the Interventional Radiology Suite where he was given Cipro for additional prophylaxis and a 6-French nephrostomy catheter was placed by the stone down the ureter into the bladder.  He was then taken to the operating room where a general anesthetic was induced on the holding room stretcher.  His genitalia was prepped with Betadine solution and a Foley catheter was inserted.  The balloon was filled with 10 mL of sterile fluid and the catheter was placed to straight drainage.  The patient was then rolled prone on to chest rolls with great care  being taken to pad all pressure points.  PAS hoses were placed.  His nephrostomy tube site was then prepped with ChloraPrep and he was draped in usual sterile fashion.  Guidewire was then passed through the nephrostomy catheter and nephrostomy catheter was removed.  The flank incision was extended to 2 cm with a knife and a 10-French peel-away sheath was placed over the catheter in the proximal ureter.  The inner core was removed and a second wire was passed down the ureter to the bladder.  At this point, the peel-away sheath was removed and a nephrostomy balloon catheter was placed over the working wire into the renal pelvis by the stone.  The balloon was inflated to 18 atmospheres and the nephrostomy sheath was advanced over the balloon into the renal pelvis.  At this point, the balloon was deflated and removed.  The working wire was left in place and the rigid nephroscope was then advanced through the sheath into the renal pelvis.  Initially, there was some bleeding, but this abated with time.  The sheath was backed out to the point where the stone was visualized.  The stone was too large to remove intact, so the CO2 StoneBreaker was then passed  through the scope and the stone was broken into manageable fragments.  The fragments were then removed with two prong grasping forceps.  Once all visible fragments were removed, inspection on fluoroscopy revealed no retained stones but a CT will be obtained in the morning to assess the need for secondary procedures.  At this point, a 20-French Councill catheter was advanced over the working wire through the sheath into the renal pelvis.  The balloon was filled with 3 mL of sterile water.  The wire was removed and nephrostogram was performed.  The tube was in good position, but there was moderate extravasation.  At this point, the nephrostomy sheath was backed out over the catheter and a fresh 6-French Kumpe catheter was placed over the  safety wire. This was advanced to the distal ureter.  The wire was then removed.  The nephrostomy tube and safety catheter were secured to the skin with a 0 silk suture which was also used to partially close the incision. Pressure was applied to the flank for approximately 4 minutes for some minor oozing.  The drapes were then removed. The dressing of 4x4s, ABDs, and Hypafix tape was applied.  The nephrostomy tube was placed to straight drainage.  The Kumpe catheter had been capped.  The patient was then rolled back supine on the recovery room stretcher.  His anesthetic was reversed.  He was moved to recovery room in stable condition.  There were no complications.     Marshall Cork. Jeffie Pollock, M.D.     JJW/MEDQ  D:  04/26/2013  T:  04/27/2013  Job:  [id number]

## 2013-04-27 NOTE — Progress Notes (Signed)
Patient ID: [name], male   DOB: 1929/06/01, 80 y.o.   MRN: [id number] 1 Day Post-Op  Subjective: [name] is without major complaints but did have some pain.  He has adequate urine output but the drainage remains bloody.  His Hgb is stable.  He continues to have some issues with Bradycardia and a junctional rhythm and has been seen by Cardiology who felt further inpatient evaluation is not indicated but that outpatient monitoring will be of value.   He is due for a CT today.   ROS: Negative except as above.   Objective: Vital signs in last 24 hours: Temp:  [97 F (36.1 C)-98.2 F (36.8 C)] 97.4 F (36.3 C) (02/13 0400) Pulse Rate:  [42-112] 98 (02/13 0400) Resp:  [16-27] 20 (02/13 0400) BP: (91-165)/(61-110) 107/63 mmHg (02/13 0400) SpO2:  [93 %-100 %] 96 % (02/13 0400) Weight:  [126.6 kg (279 lb 1.6 oz)-127.4 kg (280 lb 13.9 oz)] 127.4 kg (280 lb 13.9 oz) (02/13 0400)  Intake/Output from previous day: 02/12 0701 - 02/13 0700 In: 2117 [I.V.:2000; IV Piggyback:117] Out: 1370 [Urine:1370] Intake/Output this shift:    General appearance: alert and no distress Resp: clear to auscultation bilaterally Cardio: sinus bradycardia GI: His left flank dressing is dry  Lab Results:   Recent Labs  04/25/13 1505 04/26/13 1325 04/27/13 0345  WBC 5.9  --   --   HGB 13.7 13.8 13.7  HCT 42.9 42.0 41.5  PLT 126*  --   --    BMET  Recent Labs  04/25/13 1505 04/27/13 0345  NA 140 135*  K 4.8 4.6  CL 103 99  CO2 28 25  GLUCOSE 89 153*  BUN 20 21  CREATININE 1.21 1.29  CALCIUM 9.5 8.8   PT/INR  Recent Labs  04/25/13 1505  LABPROT 13.5  INR 1.05   ABG No results found for this basename: PHART, PCO2, PO2, HCO3,  in the last 72 hours  Studies/Results: Dg C-arm 1-60 Min-no Report  04/26/2013   CLINICAL DATA: intra op   C-ARM 1-60 MINUTES  Fluoroscopy was utilized by the requesting physician.  No radiographic  interpretation.    Ir Oris Drone Cath Perc  Left  04/26/2013   CLINICAL DATA:  Left lower pole calculus , pre percutaneous intraoperative nephrolithotomy  EXAM: LEFT PERCUTANEOUS NEPHRO-URETERAL CATHETER PLACEMENT UNDER FLUOROSCOPIC GUIDANCE  TECHNIQUE: The procedure, risks (including but not limited to bleeding, infection, organ damage ), benefits, and alternatives were explained to the patient. Questions regarding the procedure were encouraged and answered. The patient understands and consents to the procedure. The leftflank region prepped with Betadine, draped in usual sterile fashion, infiltrated locally with 1% lidocaine.As antibiotic prophylaxis, ciprofloxacin 400 mg was ordered pre-procedure and administered intravenously within one hour of incision.  Intravenous Fentanyl and Versed were administered as conscious sedation during continuous cardiorespiratory monitoring by the radiology RN, with a total moderate sedation time of 20 minutes.  Under real-time ultrasound guidance, a 21-gauge trocar needle was advanced into a posterior lower pole calyx using the calculus as a guide. A 018 inch guidewire was advanced into the proximal ureter. Needle exchanged for 3 French dilator allowing contrast injection, confirming appropriate positioning. The dilator was exchanged over the 018 wire for the transitional dilator, which allowed passage of the 035 Bentson wire down the ureter. Over this, a 5 [place] angiographic catheter was advanced into the urinary bladder. Catheter was capped externally and secured to the skin with an 0 silk suture. The patient  tolerated the procedure well. No immediate complication.  FLUOROSCOPY TIME:  5 MIN 0 seconds  IMPRESSION: 1. Technically successful left percutaneous nephro-ureteral catheter placement for planned intraoperative procedure.   Electronically Signed   By: Arne Cleveland M.D.   On: 04/26/2013 15:17    Anti-infectives: Anti-infectives   Start     Dose/Rate Route Frequency Ordered Stop   04/26/13 1800  tobramycin  (NEBCIN) 700 mg in dextrose 5 % 100 mL IVPB  Status:  Discontinued     700 mg 117.5 mL/hr over 60 Minutes Intravenous Every 24 hours 04/26/13 1731 04/27/13 0710   04/26/13 0853  ciprofloxacin (CIPRO) 400 MG/200ML IVPB    Comments:  Margaretmary Dys   : cabinet override      04/26/13 0853 04/26/13 0943   04/26/13 0730  ciprofloxacin (CIPRO) IVPB 400 mg     400 mg 200 mL/hr over 60 Minutes Intravenous On call 04/25/13 1423 04/26/13 1029   04/25/13 1730  [MAR Hold]  tobramycin (NEBCIN) 700 mg in dextrose 5 % 100 mL IVPB  Status:  Discontinued     (On MAR Hold since 04/26/13 1014)   700 mg 117.5 mL/hr over 60 Minutes Intravenous Every 24 hours 04/25/13 1637 04/26/13 1637   04/25/13 1415  tobramycin (NEBCIN) 60 mg in dextrose 5 % 50 mL IVPB  Status:  Discontinued    Comments:  PER PHARMACY DOSING   60 mg 103 mL/hr over 30 Minutes Intravenous  Once 04/25/13 1409 04/25/13 1419      Current Facility-Administered Medications  Medication Dose Route Frequency Provider Last Rate Last Dose  . acetaminophen (TYLENOL) tablet 650 mg  650 mg Oral Q4H PRN [name], MD      . atorvastatin (LIPITOR) tablet 40 mg  40 mg Oral q1800 [name], MD      . bisacodyl (DULCOLAX) suppository 10 mg  10 mg Rectal Daily PRN [name], MD      . dextrose 5 % and 0.45 % NaCl with KCl 20 mEq/L infusion   Intravenous Continuous [name], MD 100 mL/hr at 04/27/13 0727    . diphenhydrAMINE (BENADRYL) injection 12.5 mg  12.5 mg Intravenous Q6H PRN [name], MD       Or  . diphenhydrAMINE (BENADRYL) 12.5 MG/5ML elixir 12.5 mg  12.5 mg Oral Q6H PRN [name], MD      . docusate sodium (COLACE) capsule 100 mg  100 mg Oral BID [name], MD   100 mg at 04/26/13 2116  . doxazosin (CARDURA) tablet 8 mg  8 mg Oral QHS [name], MD   8 mg at 04/26/13 2116  . fesoterodine (TOVIAZ) tablet 4 mg  4 mg Oral Daily [name], MD      . HYDROmorphone (DILAUDID) injection 0.5-1 mg  0.5-1 mg Intravenous Q2H PRN [name], MD   0.5  mg at 04/27/13 0550  . ondansetron (ZOFRAN) injection 4 mg  4 mg Intravenous Q4H PRN [name], MD   4 mg at 04/27/13 0550  . oxyCODONE-acetaminophen (PERCOCET/ROXICET) 5-325 MG per tablet 1-2 tablet  1-2 tablet Oral Q4H PRN [name], MD   1 tablet at 04/27/13 0527    Assessment: s/p Procedure(s): LEFT PERCUTANEOUS NEPHROLITHOTOMY   He is doing well post op but has some persistent bradycardia.    His Hgb is stable but his urine remains bloody.   Plan: CT today and if there are no fragments, I will arrange a nephrostogram for tomorrow prior to tube removal to r/o  persistent extravasation.      LOS: 2 days    Malka So 04/27/2013

## 2013-04-28 LAB — CBC
HEMATOCRIT: 39.1 % (ref 39.0–52.0)
HEMOGLOBIN: 13.1 g/dL (ref 13.0–17.0)
MCH: 28.6 pg (ref 26.0–34.0)
MCHC: 33.5 g/dL (ref 30.0–36.0)
MCV: 85.4 fL (ref 78.0–100.0)
Platelets: 101 10*3/uL — ABNORMAL LOW (ref 150–400)
RBC: 4.58 MIL/uL (ref 4.22–5.81)
RDW: 15 % (ref 11.5–15.5)
WBC: 12.9 10*3/uL — ABNORMAL HIGH (ref 4.0–10.5)

## 2013-04-28 MED ORDER — FESOTERODINE FUMARATE ER 8 MG PO TB24
8.0000 mg | ORAL_TABLET | Freq: Every day | ORAL | Status: DC
  Administered 2013-04-29 – 2013-05-14 (×15): 8 mg via ORAL
  Filled 2013-04-28 (×19): qty 1

## 2013-04-28 MED ORDER — BISACODYL 10 MG RE SUPP
10.0000 mg | Freq: Once | RECTAL | Status: AC
  Administered 2013-04-28: 10 mg via RECTAL
  Filled 2013-04-28: qty 1

## 2013-04-28 NOTE — Progress Notes (Signed)
Patient ID: [name], male   DOB: 06-28-1929, 80 y.o.   MRN: [id number] 2 Days Post-Op  Subjective: [name] continues to have some pain and had some dry heaves yesterday.   He has not had a BM since Weds but has eaten little since then.   He has had no fever.   He continues to have urgency with UUI but his PVR was only 34cc yesterday.  He has been on toviaz 4mg  prior to admission.   His urine remains blood tinged but his Hgb remains fairly stable.     ROS: Negative except as above.   Objective: Vital signs in last 24 hours: Temp:  [98 F (36.7 C)-101.3 F (38.5 C)] 98.6 F (37 C) (02/14 0509) Pulse Rate:  [59-100] 99 (02/14 0509) Resp:  [16-30] 20 (02/14 0509) BP: (98-142)/(66-85) 142/73 mmHg (02/14 0509) SpO2:  [92 %-99 %] 92 % (02/14 0509)  Intake/Output from previous day: 02/13 0701 - 02/14 0700 In: 3198.3 [P.O.:360; I.V.:2338.3; IV Piggyback:500] Out: 480 [Urine:480] Intake/Output this shift:    General appearance: alert and no distress Resp: clear to auscultation bilaterally Cardio: regular rate and rhythm GI: Soft with positive BS.   NT in left flank draining bloody urine without clots.   Lab Results:   Recent Labs  04/25/13 1505  04/27/13 0345 04/28/13 0550  WBC 5.9  --   --  12.9*  HGB 13.7  < > 13.7 13.1  HCT 42.9  < > 41.5 39.1  PLT 126*  --   --  101*  < > = values in this interval not displayed. BMET  Recent Labs  04/25/13 1505 04/27/13 0345  NA 140 135*  K 4.8 4.6  CL 103 99  CO2 28 25  GLUCOSE 89 153*  BUN 20 21  CREATININE 1.21 1.29  CALCIUM 9.5 8.8   PT/INR  Recent Labs  04/25/13 1505  LABPROT 13.5  INR 1.05   ABG No results found for this basename: PHART, PCO2, PO2, HCO3,  in the last 72 hours  Studies/Results: Ct Abdomen Wo Contrast  04/27/2013   CLINICAL DATA:  Status post lithotripsy, evaluate for residual left renal stones  EXAM: CT ABDOMEN WITHOUT CONTRAST  TECHNIQUE: Multidetector CT imaging of the abdomen was  performed following the standard protocol without IV contrast.  COMPARISON:  IR INTRO URET CATH PERC *L* dated 04/26/2013; DG ABDOMEN 1V dated 03/16/2013; CT CTA ABD/PEL W/CM AND/OR W/O CM dated 05/19/2012  FINDINGS: There is perinephric inflammatory change on the left, and there are multiple small foci of retroperitoneal left perinephric air. There is a percutaneous nephrostomy tube in addition to a percutaneously placed and partially visualized left ureteral stent. There is mild dilatation of the left renal pelvis. There is a 4.5 cm left renal cyst.  There are a few tiny stone fragments in the mildly dilated renal pelvis. There are approximately 3 tiny fragments the largest measuring 2 mm. In the lower pole collecting system, there are for remaining calculi. These measure 4 mm, 6 mm, 4 mm, and 8 mm respectively.  There is coronary arterial calcification. There are small bilateral pleural effusions and bilateral lower lobe consolidation deep dependently. Aspiration is not excluded, although dependent atelectasis may account for the findings. There is a 6 cm abdominal aortic aneurysm at the level of the diaphragm. There is stent bypass repair involving the distal abdominal aorta. No significant change in aortic aneurysm when compared to 2014. Liver, gallbladder, right kidney, pancreas, spleen, and adrenal glands  show no acute findings.  IMPRESSION: Multiple residual left renal calculi as described above. Other findings involving the left kidney consistent with post-procedure appearance.  Bilateral lower lobe consolidation.   Electronically Signed   By: Skipper Cliche M.D.   On: 04/27/2013 09:52   Dg C-arm 1-60 Min-no Report  04/26/2013   CLINICAL DATA: intra op   C-ARM 1-60 MINUTES  Fluoroscopy was utilized by the requesting physician.  No radiographic  interpretation.    Ir Oris Drone Cath Perc Left  04/26/2013   CLINICAL DATA:  Left lower pole calculus , pre percutaneous intraoperative nephrolithotomy  EXAM:  LEFT PERCUTANEOUS NEPHRO-URETERAL CATHETER PLACEMENT UNDER FLUOROSCOPIC GUIDANCE  TECHNIQUE: The procedure, risks (including but not limited to bleeding, infection, organ damage ), benefits, and alternatives were explained to the patient. Questions regarding the procedure were encouraged and answered. The patient understands and consents to the procedure. The leftflank region prepped with Betadine, draped in usual sterile fashion, infiltrated locally with 1% lidocaine.As antibiotic prophylaxis, ciprofloxacin 400 mg was ordered pre-procedure and administered intravenously within one hour of incision.  Intravenous Fentanyl and Versed were administered as conscious sedation during continuous cardiorespiratory monitoring by the radiology RN, with a total moderate sedation time of 20 minutes.  Under real-time ultrasound guidance, a 21-gauge trocar needle was advanced into a posterior lower pole calyx using the calculus as a guide. A 018 inch guidewire was advanced into the proximal ureter. Needle exchanged for 3 French dilator allowing contrast injection, confirming appropriate positioning. The dilator was exchanged over the 018 wire for the transitional dilator, which allowed passage of the 035 Bentson wire down the ureter. Over this, a 5 [place] angiographic catheter was advanced into the urinary bladder. Catheter was capped externally and secured to the skin with an 0 silk suture. The patient tolerated the procedure well. No immediate complication.  FLUOROSCOPY TIME:  5 MIN 0 seconds  IMPRESSION: 1. Technically successful left percutaneous nephro-ureteral catheter placement for planned intraoperative procedure.   Electronically Signed   By: Arne Cleveland M.D.   On: 04/26/2013 15:17    Anti-infectives: Anti-infectives   Start     Dose/Rate Route Frequency Ordered Stop   04/28/13 1200  cefTRIAXone (ROCEPHIN) 1 g in dextrose 5 % 50 mL IVPB     1 g 100 mL/hr over 30 Minutes Intravenous Every 24 hours 04/27/13  1040     04/26/13 1800  tobramycin (NEBCIN) 700 mg in dextrose 5 % 100 mL IVPB  Status:  Discontinued     700 mg 117.5 mL/hr over 60 Minutes Intravenous Every 24 hours 04/26/13 1731 04/27/13 0710   04/26/13 0853  ciprofloxacin (CIPRO) 400 MG/200ML IVPB    Comments:  Margaretmary Dys   : cabinet override      04/26/13 0853 04/26/13 0943   04/26/13 0730  ciprofloxacin (CIPRO) IVPB 400 mg     400 mg 200 mL/hr over 60 Minutes Intravenous On call 04/25/13 1423 04/26/13 1029   04/25/13 1730  [MAR Hold]  tobramycin (NEBCIN) 700 mg in dextrose 5 % 100 mL IVPB  Status:  Discontinued     (On MAR Hold since 04/26/13 1014)   700 mg 117.5 mL/hr over 60 Minutes Intravenous Every 24 hours 04/25/13 1637 04/26/13 1637   04/25/13 1415  tobramycin (NEBCIN) 60 mg in dextrose 5 % 50 mL IVPB  Status:  Discontinued    Comments:  PER PHARMACY DOSING   60 mg 103 mL/hr over 30 Minutes Intravenous  Once 04/25/13 1409 04/25/13 1419  Current Facility-Administered Medications  Medication Dose Route Frequency Provider Last Rate Last Dose  . acetaminophen (TYLENOL) tablet 650 mg  650 mg Oral Q4H PRN [name], MD   650 mg at 04/27/13 2000  . atorvastatin (LIPITOR) tablet 40 mg  40 mg Oral q1800 [name], MD   40 mg at 04/27/13 1742  . bisacodyl (DULCOLAX) suppository 10 mg  10 mg Rectal Daily PRN [name], MD      . cefTRIAXone (ROCEPHIN) 1 g in dextrose 5 % 50 mL IVPB  1 g Intravenous Q24H [name], MD      . dextrose 5 % and 0.45 % NaCl with KCl 20 mEq/L infusion   Intravenous Continuous [name], MD 100 mL/hr at 04/28/13 0516    . diphenhydrAMINE (BENADRYL) injection 12.5 mg  12.5 mg Intravenous Q6H PRN [name], MD       Or  . diphenhydrAMINE (BENADRYL) 12.5 MG/5ML elixir 12.5 mg  12.5 mg Oral Q6H PRN [name], MD      . docusate sodium (COLACE) capsule 100 mg  100 mg Oral BID [name], MD   100 mg at 04/28/13 3220  . doxazosin (CARDURA) tablet 8 mg  8 mg Oral QHS [name], MD   8 mg at 04/27/13  2254  . [START ON 04/29/2013] fesoterodine (TOVIAZ) tablet 8 mg  8 mg Oral Daily [name], MD      . HYDROmorphone (DILAUDID) injection 0.5-1 mg  0.5-1 mg Intravenous Q2H PRN [name], MD   1 mg at 04/28/13 2542  . ondansetron (ZOFRAN) injection 4 mg  4 mg Intravenous Q4H PRN [name], MD   4 mg at 04/27/13 0550  . oxyCODONE-acetaminophen (PERCOCET/ROXICET) 5-325 MG per tablet 1-2 tablet  1-2 tablet Oral Q4H PRN [name], MD   1 tablet at 04/27/13 1130  . traMADol (ULTRAM) tablet 50 mg  50 mg Oral QID PRN [name], MD        Assessment: s/p Procedure(s): LEFT PERCUTANEOUS NEPHROLITHOTOMY   He continues to have increased UUI but is emptying.  He has been on toviaz 4mg  preop. He is constipated. His urine remains bloody but his Hgb is stable.  He is tolerating rocephin.   Plan: I am going to increase the toviaz to 8mg  daily. I will have him given a B&O.  If the UUI doesn't improve with the increased toviaz, I will consider Myrbetriq. He is on the schedule for a second look next Thursday and I hope to get him home prior to that but he can't go now.   I will get a PT/OT consult to aid ambulation.       LOS: 3 days    Malka So 04/28/2013

## 2013-04-29 NOTE — Evaluation (Addendum)
Physical Therapy Evaluation Patient Details Name: [name] MRN: [id number] DOB: 26-Sep-1929 Today's Date: 04/29/2013 Time: 1011-1040 PT Time Calculation (min): 29 min  PT Assessment / Plan / Recommendation History of Present Illness  [name] returns today in f/u.  He has a history of a 3.66mm right renal stone and a 1.4cm LLP stone with microhematuria.  He thinks he passed a stone on the left about a month ago.   He has had no flank pain or gross hematuria.   he has no dysuria but he does have some urgency and UUI.   He has a history of a UTI in 2/14 and had another in 7/14 that was treated with fosfomycin but he doesn't think that helped, and his UA today looks infected.  He has a history of prostate cancer treated in 2008 . Pt is S/P  L percutaneous Nephrolithotomy on 04/27/13.  Clinical Impression  Pt incontinent of BM prior to getting up. Pt is weak, requires 2 persons for safety to be up with RW. Pt adamantly declines going to SNF for rehab. Recommend OT consult and HHPT if family  Can provide assistance. Pt reports his wife is unable to assist./    PT Assessment  Patient needs continued PT services    Follow Up Recommendations   HHPT, Supervision/Assistance - 24 hour    Does the patient have the potential to tolerate intense rehabilitation      Barriers to Discharge Decreased caregiver support      Equipment Recommendations  None recommended by PT    Recommendations for Other Services OT consult   Frequency Min 3X/week    Precautions / Restrictions Precautions Precautions: Fall Precaution Comments: L post/lateral drain, condom cath   Pertinent Vitals/Pain L side  At tube site when moving.     Mobility  Bed Mobility Overal bed mobility: Needs Assistance;+2 for physical assistance;+ 2 for safety/equipment Bed Mobility: Supine to Sit Supine to sit: +2 for physical assistance;+2 for safety/equipment;Max assist;HOB elevated General bed mobility comments: pt requires  extensive assistance to get to sitting upright on edge af bed, HOB elevated to facilitate. Transfers Overall transfer level: Needs assistance Equipment used: Rolling walker (2 wheeled) Transfers: Sit to/from [facility] Sit to Stand: +2 physical assistance;+2 safety/equipment;Mod assist;From elevated surface Stand pivot transfers: +2 safety/equipment;+2 physical assistance;Mod assist;From elevated surface General transfer comment: pt stood  from bed with cues for UE placement.  able to take steps to move to recliner. Ambulation/Gait Ambulation/Gait assistance: +2 safety/equipment;Mod assist Ambulation Distance (Feet): 5 Feet Assistive device: Rolling walker (2 wheeled) Gait Pattern/deviations: Step-to pattern General Gait Details: pt took 4 steps forward and back x 2 before sitting down into recliner.    Exercises     PT Diagnosis: Difficulty walking;Generalized weakness  PT Problem List: Decreased strength;Decreased range of motion;Decreased activity tolerance;Decreased mobility;Decreased knowledge of use of DME;Decreased knowledge of precautions;Decreased safety awareness;Pain PT Treatment Interventions: DME instruction;Gait training;Functional mobility training;Therapeutic activities;Therapeutic exercise;Patient/family education     PT Goals(Current goals can be found in the care plan section) Acute Rehab PT Goals Patient Stated Goal: I want to go home PT Goal Formulation: With patient Time For Goal Achievement: 05/13/13 Potential to Achieve Goals: Good  Visit Information  Last PT Received On: 04/29/13 Assistance Needed: +2 History of Present Illness: [name] returns today in f/u.  He has a history of a 3.6mm right renal stone and a 1.4cm LLP stone with microhematuria.  He thinks he passed a stone on the left  about a month ago.   He has had no flank pain or gross hematuria.   he has no dysuria but he does have some urgency and UUI.   He has a history of a UTI  in 2/14 and had another in 7/14 that was treated with fosfomycin but he doesn't think that helped, and his UA today looks infected.  He has a history of prostate cancer treated in 2008 . Pt is S/P  L percutaneous Nephrolithotomy on 04/27/13.       Prior [place] expects to be discharged to:: Private residence Living Arrangements: Spouse/significant other Available Help at Discharge: Family Type of Home: House Home Access: Stairs to enter Technical brewer of Steps: 1 Home Layout: One level Home Equipment: Environmental consultant - 2 wheels;Bedside commode Additional Comments: Does not want to go to rehab again, states wife is unable to help, daughter lives nex door. Prior Function Level of Independence: Independent Communication Communication: HOH    Cognition  Cognition Arousal/Alertness: Awake/alert Behavior During Therapy: WFL for tasks assessed/performed Overall Cognitive Status: Within Functional Limits for tasks assessed    Extremity/Trunk Assessment Upper Extremity Assessment Upper Extremity Assessment: Generalized weakness Lower Extremity Assessment Lower Extremity Assessment: Generalized weakness;LLE deficits/detail LLE Deficits / Details: stiffness L knee.   Balance Balance Overall balance assessment: Needs assistance Sitting-balance support: Bilateral upper extremity supported Sitting balance-Leahy Scale: Fair Standing balance support: Bilateral upper extremity supported;During functional activity Standing balance-Leahy Scale: Fair  End of Session PT - End of Session Equipment Utilized During Treatment: Gait belt Activity Tolerance: Patient limited by fatigue;Patient tolerated treatment well Patient left: in chair;with call bell/phone within reach Nurse Communication: Mobility status  GP     Claretha Cooper 04/29/2013, 11:18 AM Tresa Endo PT [phone]

## 2013-04-29 NOTE — Progress Notes (Signed)
Patient ID: [name], male   DOB: 01/15/1930, 80 y.o.   MRN: [id number] 3 Days Post-Op  Subjective: [name] is feeling better today.  He had 3 BM's last night but is not having diarrhea.   He continues to have UUI and is using the condom catheter.   He is eating better as well.   He has had increased output from the left NT and the urine is clearing.  His Hgb is down minimally at 13.1.   He has a moderate thrombocytopenia of 101K and an elevated WBC but no fever.  He remains on rocephin. ROS: Negative except as above.  He has no nausea and no fever.   He remains weak.   Objective: Vital signs in last 24 hours: Temp:  [98.4 F (36.9 C)-100.1 F (37.8 C)] 98.4 F (36.9 C) (02/15 0531) Pulse Rate:  [95-116] 95 (02/15 0531) Resp:  [22-24] 22 (02/15 0531) BP: (112-137)/(74-86) 112/74 mmHg (02/15 0531) SpO2:  [90 %-93 %] 92 % (02/15 0531)  Intake/Output from previous day: 02/14 0701 - 02/15 0700 In: 3343.3 [P.O.:840; I.V.:2453.3; IV Piggyback:50] Out: 2000 [Urine:2000] Intake/Output this shift:    General appearance: alert and no distress Resp: clear to auscultation bilaterally Cardio: regular rate and rhythm GI: soft, non-tender; bowel sounds normal; no masses,  no organomegaly and The NT is draining well with pink urine.   Lab Results:   Recent Labs  04/27/13 0345 04/28/13 0550  WBC  --  12.9*  HGB 13.7 13.1  HCT 41.5 39.1  PLT  --  101*   BMET  Recent Labs  04/27/13 0345  NA 135*  K 4.6  CL 99  CO2 25  GLUCOSE 153*  BUN 21  CREATININE 1.29  CALCIUM 8.8   PT/INR No results found for this basename: LABPROT, INR,  in the last 72 hours ABG No results found for this basename: PHART, PCO2, PO2, HCO3,  in the last 72 hours  Studies/Results: No results found.  Anti-infectives: Anti-infectives   Start     Dose/Rate Route Frequency Ordered Stop   04/28/13 1200  cefTRIAXone (ROCEPHIN) 1 g in dextrose 5 % 50 mL IVPB     1 g 100 mL/hr over 30 Minutes  Intravenous Every 24 hours 04/27/13 1040     04/26/13 1800  tobramycin (NEBCIN) 700 mg in dextrose 5 % 100 mL IVPB  Status:  Discontinued     700 mg 117.5 mL/hr over 60 Minutes Intravenous Every 24 hours 04/26/13 1731 04/27/13 0710   04/26/13 0853  ciprofloxacin (CIPRO) 400 MG/200ML IVPB    Comments:  Margaretmary Dys   : cabinet override      04/26/13 0853 04/26/13 0943   04/26/13 0730  ciprofloxacin (CIPRO) IVPB 400 mg     400 mg 200 mL/hr over 60 Minutes Intravenous On call 04/25/13 1423 04/26/13 1029   04/25/13 1730  [MAR Hold]  tobramycin (NEBCIN) 700 mg in dextrose 5 % 100 mL IVPB  Status:  Discontinued     (On MAR Hold since 04/26/13 1014)   700 mg 117.5 mL/hr over 60 Minutes Intravenous Every 24 hours 04/25/13 1637 04/26/13 1637   04/25/13 1415  tobramycin (NEBCIN) 60 mg in dextrose 5 % 50 mL IVPB  Status:  Discontinued    Comments:  PER PHARMACY DOSING   60 mg 103 mL/hr over 30 Minutes Intravenous  Once 04/25/13 1409 04/25/13 1419      Current Facility-Administered Medications  Medication Dose Route Frequency Provider Last Rate  Last Dose  . acetaminophen (TYLENOL) tablet 650 mg  650 mg Oral Q4H PRN [name], MD   650 mg at 04/28/13 2127  . atorvastatin (LIPITOR) tablet 40 mg  40 mg Oral q1800 [name], MD   40 mg at 04/28/13 1729  . bisacodyl (DULCOLAX) suppository 10 mg  10 mg Rectal Daily PRN [name], MD      . cefTRIAXone (ROCEPHIN) 1 g in dextrose 5 % 50 mL IVPB  1 g Intravenous Q24H [name], MD   1 g at 04/28/13 1105  . dextrose 5 % and 0.45 % NaCl with KCl 20 mEq/L infusion   Intravenous Continuous [name], MD 100 mL/hr at 04/29/13 0045    . diphenhydrAMINE (BENADRYL) injection 12.5 mg  12.5 mg Intravenous Q6H PRN [name], MD       Or  . diphenhydrAMINE (BENADRYL) 12.5 MG/5ML elixir 12.5 mg  12.5 mg Oral Q6H PRN [name], MD      . docusate sodium (COLACE) capsule 100 mg  100 mg Oral BID [name], MD   100 mg at 04/29/13 1884  . doxazosin (CARDURA)  tablet 8 mg  8 mg Oral QHS [name], MD   8 mg at 04/28/13 2133  . fesoterodine (TOVIAZ) tablet 8 mg  8 mg Oral Daily [name], MD   8 mg at 04/29/13 1660  . HYDROmorphone (DILAUDID) injection 0.5-1 mg  0.5-1 mg Intravenous Q2H PRN [name], MD   1 mg at 04/29/13 0352  . ondansetron (ZOFRAN) injection 4 mg  4 mg Intravenous Q4H PRN [name], MD   4 mg at 04/27/13 0550  . oxyCODONE-acetaminophen (PERCOCET/ROXICET) 5-325 MG per tablet 1-2 tablet  1-2 tablet Oral Q4H PRN [name], MD   1 tablet at 04/27/13 1130  . traMADol (ULTRAM) tablet 50 mg  50 mg Oral QID PRN [name], MD        Assessment: s/p Procedure(s): LEFT PERCUTANEOUS NEPHROLITHOTOMY   He continues to have UUI despite the higher dose of toviaz. He has a new thrombocytopenia that will need to be monitored particularly with the repeat procedure that is planned for next Thursday. He remains weak and not ready for discharge.  Plan: Heplock IV.  Continue rocephin.  I will continue the rocephin since he has only had one day on the higher dose. PT to see today.     LOS: 4 days    Malka So 04/29/2013

## 2013-04-30 ENCOUNTER — Other Ambulatory Visit: Payer: Self-pay | Admitting: Urology

## 2013-04-30 DIAGNOSIS — D696 Thrombocytopenia, unspecified: Secondary | ICD-10-CM | POA: Diagnosis not present

## 2013-04-30 NOTE — Progress Notes (Signed)
Patient ID: [name], male   DOB: 08/14/1929, 80 y.o.   MRN: [id number] 4 Days Post-Op  Subjective: Mr. Halsted is doing well without pain and his urine has finally cleared in the NT.   He continues to have some issues with UUI which is probably related to the safety catheter.   He remains afebrile.   PT saw him yesterday and got him up in the chair but he remains weak and needs additional therapy ROS: Negative except as above  Objective: Vital signs in last 24 hours: Temp:  [98.3 F (36.8 C)-98.9 F (37.2 C)] 98.9 F (37.2 C) (02/16 0603) Pulse Rate:  [93-102] 93 (02/16 0603) Resp:  [20-22] 20 (02/16 0603) BP: (142-146)/(87-96) 142/96 mmHg (02/16 0603) SpO2:  [94 %-96 %] 96 % (02/16 0603)  Intake/Output from previous day: 02/15 0701 - 02/16 0700 In: 2228.3 [P.O.:1820; I.V.:408.3] Out: 2100 [Urine:2100] Intake/Output this shift:    General appearance: alert, no distress and urine clear in NT.   Lab Results:   Recent Labs  04/28/13 0550  WBC 12.9*  HGB 13.1  HCT 39.1  PLT 101*   BMET No results found for this basename: NA, K, CL, CO2, GLUCOSE, BUN, CREATININE, CALCIUM,  in the last 72 hours PT/INR No results found for this basename: LABPROT, INR,  in the last 72 hours ABG No results found for this basename: PHART, PCO2, PO2, HCO3,  in the last 72 hours  Studies/Results: No results found.  Anti-infectives: Anti-infectives   Start     Dose/Rate Route Frequency Ordered Stop   04/28/13 1200  cefTRIAXone (ROCEPHIN) 1 g in dextrose 5 % 50 mL IVPB     1 g 100 mL/hr over 30 Minutes Intravenous Every 24 hours 04/27/13 1040     04/26/13 1800  tobramycin (NEBCIN) 700 mg in dextrose 5 % 100 mL IVPB  Status:  Discontinued     700 mg 117.5 mL/hr over 60 Minutes Intravenous Every 24 hours 04/26/13 1731 04/27/13 0710   04/26/13 0853  ciprofloxacin (CIPRO) 400 MG/200ML IVPB    Comments:  Margaretmary Dys   : cabinet override      04/26/13 0853 04/26/13 0943   04/26/13 0730   ciprofloxacin (CIPRO) IVPB 400 mg     400 mg 200 mL/hr over 60 Minutes Intravenous On call 04/25/13 1423 04/26/13 1029   04/25/13 1730  [MAR Hold]  tobramycin (NEBCIN) 700 mg in dextrose 5 % 100 mL IVPB  Status:  Discontinued     (On MAR Hold since 04/26/13 1014)   700 mg 117.5 mL/hr over 60 Minutes Intravenous Every 24 hours 04/25/13 1637 04/26/13 1637   04/25/13 1415  tobramycin (NEBCIN) 60 mg in dextrose 5 % 50 mL IVPB  Status:  Discontinued    Comments:  PER PHARMACY DOSING   60 mg 103 mL/hr over 30 Minutes Intravenous  Once 04/25/13 1409 04/25/13 1419      Current Facility-Administered Medications  Medication Dose Route Frequency Provider Last Rate Last Dose  . acetaminophen (TYLENOL) tablet 650 mg  650 mg Oral Q4H PRN [name], MD   650 mg at 04/28/13 2127  . atorvastatin (LIPITOR) tablet 40 mg  40 mg Oral q1800 [name], MD   40 mg at 04/29/13 1713  . bisacodyl (DULCOLAX) suppository 10 mg  10 mg Rectal Daily PRN [name], MD      . cefTRIAXone (ROCEPHIN) 1 g in dextrose 5 % 50 mL IVPB  1 g Intravenous Q24H [name], MD  1 g at 04/29/13 1111  . diphenhydrAMINE (BENADRYL) injection 12.5 mg  12.5 mg Intravenous Q6H PRN [name], MD       Or  . diphenhydrAMINE (BENADRYL) 12.5 MG/5ML elixir 12.5 mg  12.5 mg Oral Q6H PRN [name], MD      . docusate sodium (COLACE) capsule 100 mg  100 mg Oral BID [name], MD   100 mg at 04/29/13 2158  . doxazosin (CARDURA) tablet 8 mg  8 mg Oral QHS [name], MD   8 mg at 04/29/13 2158  . fesoterodine (TOVIAZ) tablet 8 mg  8 mg Oral Daily [name], MD   8 mg at 04/29/13 1962  . HYDROmorphone (DILAUDID) injection 0.5-1 mg  0.5-1 mg Intravenous Q2H PRN [name], MD   1 mg at 04/29/13 2009  . ondansetron (ZOFRAN) injection 4 mg  4 mg Intravenous Q4H PRN [name], MD   4 mg at 04/27/13 0550  . oxyCODONE-acetaminophen (PERCOCET/ROXICET) 5-325 MG per tablet 1-2 tablet  1-2 tablet Oral Q4H PRN [name], MD   1 tablet at 04/27/13 1130   . traMADol (ULTRAM) tablet 50 mg  50 mg Oral QID PRN [name], MD        Assessment: s/p Procedure(s): LEFT PERCUTANEOUS NEPHROLITHOTOMY   He is doing well but continues to have UUI despite Toviaz 8mg  and remains weak. He has a moderate thrombocytopenia that I will recheck on Weds.  Plan: Continue PT.  Continue rocephin. Plan for second stage PCNL on Thursday.       LOS: 5 days    Malka So 04/30/2013

## 2013-05-01 NOTE — Progress Notes (Signed)
Patient ID: MELL GUIA, male   DOB: January 07, 1930, 80 y.o.   MRN: [id number] 5 Days Post-Op  Subjective: [name] is doing better with his bladder with reduced UUI.  He remains on rocephin for his UTI.   He is on the schedule for his second look PCNL on Thursday.  He has mild left flank pain at times.   His urine is clear.   He has been seen by PT and OT has been recommended but he has not been up since the initial evaluation. ROS: Negative except as above.  He has no fever or nausea.   Objective: Vital signs in last 24 hours: Temp:  [98.6 F (37 C)-98.9 F (37.2 C)] 98.6 F (37 C) (02/17 0645) Pulse Rate:  [94-100] 94 (02/17 0645) Resp:  [18-20] 18 (02/17 0645) BP: (134-148)/(76-91) 134/89 mmHg (02/17 0645) SpO2:  [94 %-98 %] 94 % (02/17 0645)  Intake/Output from previous day: 02/16 0701 - 02/17 0700 In: 510 [P.O.:510] Out: 1325 [Urine:1325] Intake/Output this shift:    General appearance: alert and no distress NT draining clear urine.   Lab Results:  No results found for this basename: WBC, HGB, HCT, PLT,  in the last 72 hours BMET No results found for this basename: NA, K, CL, CO2, GLUCOSE, BUN, CREATININE, CALCIUM,  in the last 72 hours PT/INR No results found for this basename: LABPROT, INR,  in the last 72 hours ABG No results found for this basename: PHART, PCO2, PO2, HCO3,  in the last 72 hours  Studies/Results: No results found.  Anti-infectives: Anti-infectives   Start     Dose/Rate Route Frequency Ordered Stop   04/28/13 1200  cefTRIAXone (ROCEPHIN) 1 g in dextrose 5 % 50 mL IVPB     1 g 100 mL/hr over 30 Minutes Intravenous Every 24 hours 04/27/13 1040     04/26/13 1800  tobramycin (NEBCIN) 700 mg in dextrose 5 % 100 mL IVPB  Status:  Discontinued     700 mg 117.5 mL/hr over 60 Minutes Intravenous Every 24 hours 04/26/13 1731 04/27/13 0710   04/26/13 0853  ciprofloxacin (CIPRO) 400 MG/200ML IVPB    Comments:  Margaretmary Dys   : cabinet override   04/26/13 0853 04/26/13 0943   04/26/13 0730  ciprofloxacin (CIPRO) IVPB 400 mg     400 mg 200 mL/hr over 60 Minutes Intravenous On call 04/25/13 1423 04/26/13 1029   04/25/13 1730  [MAR Hold]  tobramycin (NEBCIN) 700 mg in dextrose 5 % 100 mL IVPB  Status:  Discontinued     (On MAR Hold since 04/26/13 1014)   700 mg 117.5 mL/hr over 60 Minutes Intravenous Every 24 hours 04/25/13 1637 04/26/13 1637   04/25/13 1415  tobramycin (NEBCIN) 60 mg in dextrose 5 % 50 mL IVPB  Status:  Discontinued    Comments:  PER PHARMACY DOSING   60 mg 103 mL/hr over 30 Minutes Intravenous  Once 04/25/13 1409 04/25/13 1419      Current Facility-Administered Medications  Medication Dose Route Frequency Provider Last Rate Last Dose  . acetaminophen (TYLENOL) tablet 650 mg  650 mg Oral Q4H PRN [name], MD   650 mg at 04/28/13 2127  . atorvastatin (LIPITOR) tablet 40 mg  40 mg Oral q1800 [name], MD   40 mg at 04/30/13 1824  . bisacodyl (DULCOLAX) suppository 10 mg  10 mg Rectal Daily PRN [name], MD      . cefTRIAXone (ROCEPHIN) 1 g in dextrose 5 % 50  mL IVPB  1 g Intravenous Q24H [name], MD   1 g at 04/30/13 1137  . diphenhydrAMINE (BENADRYL) injection 12.5 mg  12.5 mg Intravenous Q6H PRN [name], MD       Or  . diphenhydrAMINE (BENADRYL) 12.5 MG/5ML elixir 12.5 mg  12.5 mg Oral Q6H PRN [name], MD   12.5 mg at 04/30/13 1828  . docusate sodium (COLACE) capsule 100 mg  100 mg Oral BID [name], MD   100 mg at 04/30/13 2157  . doxazosin (CARDURA) tablet 8 mg  8 mg Oral QHS [name], MD   8 mg at 04/30/13 2157  . fesoterodine (TOVIAZ) tablet 8 mg  8 mg Oral Daily [name], MD   8 mg at 04/30/13 1137  . HYDROmorphone (DILAUDID) injection 0.5-1 mg  0.5-1 mg Intravenous Q2H PRN [name], MD   1 mg at 05/01/13 0112  . ondansetron (ZOFRAN) injection 4 mg  4 mg Intravenous Q4H PRN [name], MD   4 mg at 04/27/13 0550  . oxyCODONE-acetaminophen (PERCOCET/ROXICET) 5-325 MG per tablet 1-2 tablet   1-2 tablet Oral Q4H PRN [name], MD   1 tablet at 04/27/13 1130  . traMADol (ULTRAM) tablet 50 mg  50 mg Oral QID PRN [name], MD        Assessment: s/p Procedure(s): LEFT PERCUTANEOUS NEPHROLITHOTOMY   He is doing well but is deconditioned and is not getting assistance with ambulation.  His UUI has improved with the higher dose Toviaz.   Plan: I have reinforced the need for the patient to ambulate. We will proceed with PCNL on Thursday.   Continue rocephin.     LOS: 6 days    Malka So 05/01/2013

## 2013-05-01 NOTE — Progress Notes (Signed)
Physical Therapy Treatment Patient Details Name: KEYION KNACK MRN: [id number] DOB: [birth year] Today's Date: 05/01/2013 Time: 1749-4496 PT Time Calculation (min): 24 min  PT Assessment / Plan / Recommendation  History of Present Illness Mr. Noll returns today in f/u.  He has a history of a 3.65mm right renal stone and a 1.4cm LLP stone with microhematuria.  He thinks he passed a stone on the left about a month ago.   He has had no flank pain or gross hematuria.   he has no dysuria but he does have some urgency and UUI.   He has a history of a UTI in 2/14 and had another in 7/14 that was treated with fosfomycin but he doesn't think that helped, and his UA today looks infected.  He has a history of prostate cancer treated in 2008 . Pt is S/P  L percutaneous Nephrolithotomy on 04/27/13.   PT Comments   Pt OOB in recliner incont urine.  Assisted with standing twice for hygiene + 2 assist.  Amb with EVA walker for increased support.  Assisted back to bed per pt request.   Follow Up Recommendations  Home health PT (Pt ref SNF)     Does the patient have the potential to tolerate intense rehabilitation     Barriers to Discharge        Equipment Recommendations       Recommendations for Other Services    Frequency Min 3X/week   Progress towards PT Goals Progress towards PT goals: Progressing toward goals  Plan      Precautions / Restrictions Precautions Precautions: Fall Precaution Comments: L post/lateral drain, condom cath Restrictions Weight Bearing Restrictions: No   Pertinent Vitals/Pain     Mobility  Bed Mobility Bed Mobility: Sit to Supine Sit to supine: +2 for physical assistance General bed mobility comments: + 2 assist back to bed Transfers Overall transfer level: Needs assistance Equipment used:  (EVA walker) Transfers: Sit to/from Stand Sit to Stand: +2 physical assistance;+2 safety/equipment;Mod assist;From elevated surface General transfer comment: + 2 assist  twice due to hygiene.   Ambulation/Gait Ambulation/Gait assistance: +2 physical assistance Ambulation Distance (Feet): 42 Feet Assistive device:  (EVA walker) Gait Pattern/deviations: Step-to pattern Gait velocity: decreased General Gait Details: Used EVA walker for increased support.  Pt was able to amb greater distance.  Noted decrease stance time R LE and excessive support thru B platform EVA walker.       PT Goals (current goals can now be found in the care plan section)    Visit Information  Last PT Received On: 05/01/13 History of Present Illness: Mr. Dejaynes returns today in f/u.  He has a history of a 3.45mm right renal stone and a 1.4cm LLP stone with microhematuria.  He thinks he passed a stone on the left about a month ago.   He has had no flank pain or gross hematuria.   he has no dysuria but he does have some urgency and UUI.   He has a history of a UTI in 2/14 and had another in 7/14 that was treated with fosfomycin but he doesn't think that helped, and his UA today looks infected.  He has a history of prostate cancer treated in 2008 . Pt is S/P  L percutaneous Nephrolithotomy on 04/27/13.    Subjective Data      Cognition       Balance     End of Session PT - End of Session Equipment Utilized During Treatment: Gait  belt Activity Tolerance: Patient limited by fatigue;Patient tolerated treatment well Patient left: in bed;with call bell/phone within reach   Rica Koyanagi  PTA [facility]  Acute  Rehab Pager      [phone]

## 2013-05-02 ENCOUNTER — Other Ambulatory Visit: Payer: Self-pay

## 2013-05-02 LAB — CBC
HEMATOCRIT: 36.9 % — AB (ref 39.0–52.0)
Hemoglobin: 12.1 g/dL — ABNORMAL LOW (ref 13.0–17.0)
MCH: 27.9 pg (ref 26.0–34.0)
MCHC: 32.8 g/dL (ref 30.0–36.0)
MCV: 85.2 fL (ref 78.0–100.0)
PLATELETS: 140 10*3/uL — AB (ref 150–400)
RBC: 4.33 MIL/uL (ref 4.22–5.81)
RDW: 14.6 % (ref 11.5–15.5)
WBC: 8.5 10*3/uL (ref 4.0–10.5)

## 2013-05-02 LAB — TYPE AND SCREEN
ABO/RH(D): O POS
Antibody Screen: NEGATIVE

## 2013-05-02 MED ORDER — DEXTROSE 5 % IV SOLN
2.0000 g | INTRAVENOUS | Status: AC
  Administered 2013-05-03: 2 g via INTRAVENOUS
  Filled 2013-05-02: qty 2

## 2013-05-02 NOTE — Progress Notes (Signed)
Patient ID: [name], male   DOB: 1929-07-17, 80 y.o.   MRN: [id number] 6 Days Post-Op  Subjective: [name] is doing well.  He was up walking with assistance this morning.  He has no complaints of pain.  His urgency remains improved.   ROS: Negative except He has not had a BM in a couple of days.   Objective: Vital signs in last 24 hours: Temp:  [98.1 F (36.7 C)-98.7 F (37.1 C)] 98.4 F (36.9 C) (02/18 0519) Pulse Rate:  [90-106] 90 (02/18 0519) Resp:  [18-20] 20 (02/18 0519) BP: (109-142)/(70-94) 129/82 mmHg (02/18 0519) SpO2:  [94 %-97 %] 95 % (02/18 0519)  Intake/Output from previous day: 02/17 0701 - 02/18 0700 In: 960 [P.O.:960] Out: 1075 [Urine:1075] Intake/Output this shift:    General appearance: alert, no distress and he is weak and requires assistence for ambulation.  Resp: clear to auscultation bilaterally Cardio: regular rate and rhythm GI: soft, NT with + BS.   urine clear in NT.   Lab Results:   Recent Labs  05/02/13 0438  WBC 8.5  HGB 12.1*  HCT 36.9*  PLT 140*   BMET No results found for this basename: NA, K, CL, CO2, GLUCOSE, BUN, CREATININE, CALCIUM,  in the last 72 hours PT/INR No results found for this basename: LABPROT, INR,  in the last 72 hours ABG No results found for this basename: PHART, PCO2, PO2, HCO3,  in the last 72 hours  Studies/Results: No results found.  Anti-infectives: Anti-infectives   Start     Dose/Rate Route Frequency Ordered Stop   04/28/13 1200  cefTRIAXone (ROCEPHIN) 1 g in dextrose 5 % 50 mL IVPB     1 g 100 mL/hr over 30 Minutes Intravenous Every 24 hours 04/27/13 1040     04/26/13 1800  tobramycin (NEBCIN) 700 mg in dextrose 5 % 100 mL IVPB  Status:  Discontinued     700 mg 117.5 mL/hr over 60 Minutes Intravenous Every 24 hours 04/26/13 1731 04/27/13 0710   04/26/13 0853  ciprofloxacin (CIPRO) 400 MG/200ML IVPB    Comments:  Margaretmary Dys   : cabinet override      04/26/13 0853 04/26/13 0943   04/26/13 0730  ciprofloxacin (CIPRO) IVPB 400 mg     400 mg 200 mL/hr over 60 Minutes Intravenous On call 04/25/13 1423 04/26/13 1029   04/25/13 1730  [MAR Hold]  tobramycin (NEBCIN) 700 mg in dextrose 5 % 100 mL IVPB  Status:  Discontinued     (On MAR Hold since 04/26/13 1014)   700 mg 117.5 mL/hr over 60 Minutes Intravenous Every 24 hours 04/25/13 1637 04/26/13 1637   04/25/13 1415  tobramycin (NEBCIN) 60 mg in dextrose 5 % 50 mL IVPB  Status:  Discontinued    Comments:  PER PHARMACY DOSING   60 mg 103 mL/hr over 30 Minutes Intravenous  Once 04/25/13 1409 04/25/13 1419      Current Facility-Administered Medications  Medication Dose Route Frequency Provider Last Rate Last Dose  . acetaminophen (TYLENOL) tablet 650 mg  650 mg Oral Q4H PRN [name], MD   650 mg at 04/28/13 2127  . atorvastatin (LIPITOR) tablet 40 mg  40 mg Oral q1800 [name], MD   40 mg at 05/01/13 1758  . bisacodyl (DULCOLAX) suppository 10 mg  10 mg Rectal Daily PRN [name], MD      . cefTRIAXone (ROCEPHIN) 1 g in dextrose 5 % 50 mL IVPB  1 g Intravenous Q24H  [name], MD   1 g at 04/30/13 1137  . diphenhydrAMINE (BENADRYL) injection 12.5 mg  12.5 mg Intravenous Q6H PRN [name], MD       Or  . diphenhydrAMINE (BENADRYL) 12.5 MG/5ML elixir 12.5 mg  12.5 mg Oral Q6H PRN [name], MD   12.5 mg at 04/30/13 1828  . docusate sodium (COLACE) capsule 100 mg  100 mg Oral BID [name], MD   100 mg at 05/01/13 2049  . doxazosin (CARDURA) tablet 8 mg  8 mg Oral QHS [name], MD   8 mg at 05/01/13 2049  . fesoterodine (TOVIAZ) tablet 8 mg  8 mg Oral Daily [name], MD   8 mg at 05/01/13 7510  . HYDROmorphone (DILAUDID) injection 0.5-1 mg  0.5-1 mg Intravenous Q2H PRN [name], MD   1 mg at 05/01/13 2215  . ondansetron (ZOFRAN) injection 4 mg  4 mg Intravenous Q4H PRN [name], MD   4 mg at 04/27/13 0550  . oxyCODONE-acetaminophen (PERCOCET/ROXICET) 5-325 MG per tablet 1-2 tablet  1-2 tablet Oral Q4H PRN [name], MD   2 tablet at 05/01/13 2049  . traMADol (ULTRAM) tablet 50 mg  50 mg Oral QID PRN [name], MD        Assessment: s/p Procedure(s): LEFT PERCUTANEOUS NEPHROLITHOTOMY   He is doing well but remains weak. Afebrile on rocephin.   Plan: He will have a second look PCNL tomorrow.  Continue PT/OT.  He will not need antibiotic post discharge.      LOS: 7 days    Chord Takahashi J 05/02/2013

## 2013-05-02 NOTE — Progress Notes (Signed)
Physical Therapy Treatment Patient Details Name: LAKENDRICK PARADIS MRN: [id number] DOB: 11-11-1929 Today's Date: 05/02/2013 Time: 0836-0900 PT Time Calculation (min): 24 min  PT Assessment / Plan / Recommendation  History of Present Illness Mr. Scarantino returns today in f/u.  He has a history of a 3.33mm right renal stone and a 1.4cm LLP stone with microhematuria.  He thinks he passed a stone on the left about a month ago.   He has had no flank pain or gross hematuria.   he has no dysuria but he does have some urgency and UUI.   He has a history of a UTI in 2/14 and had another in 7/14 that was treated with fosfomycin but he doesn't think that helped, and his UA today looks infected.  He has a history of prostate cancer treated in 2008 . Pt is S/P  L percutaneous Nephrolithotomy on 04/27/13.   PT Comments   Assisted pt OOB and amb full unit using EVA walker for increased support.   Follow Up Recommendations  Home health PT     Does the patient have the potential to tolerate intense rehabilitation     Barriers to Discharge        Equipment Recommendations  None recommended by PT    Recommendations for Other Services    Frequency Min 3X/week   Progress towards PT Goals Progress towards PT goals: Progressing toward goals  Plan      Precautions / Restrictions Precautions Precautions: Fall Precaution Comments: L post/lateral drain Restrictions Weight Bearing Restrictions: No   Pertinent Vitals/Pain No c/o    Mobility  Bed Mobility Overal bed mobility: Needs Assistance;+2 for physical assistance;+ 2 for safety/equipment Bed Mobility: Supine to Sit Supine to sit: +2 for physical assistance;+2 for safety/equipment;Max assist;HOB elevated General bed mobility comments: increased time and repeat cueing to stay on task Transfers Overall transfer level: Needs assistance Equipment used:  (EVA walker) Transfers: Sit to/from Stand Sit to Stand: +2 physical assistance;+2 safety/equipment;Mod  assist;From elevated surface (pt 50%) General transfer comment: increased tolerance Ambulation/Gait Ambulation/Gait assistance: +2 physical assistance;Min assist Ambulation Distance (Feet): 450 Feet Assistive device:  (EVA walker) Gait Pattern/deviations: Step-to pattern;Decreased stance time - right General Gait Details: Used EVA walker for increased support.  Pt was able to amb greater distance.      PT Goals (current goals can now be found in the care plan section)    Visit Information  Last PT Received On: 05/02/13 Assistance Needed: +2 History of Present Illness: Mr. Savage returns today in f/u.  He has a history of a 3.10mm right renal stone and a 1.4cm LLP stone with microhematuria.  He thinks he passed a stone on the left about a month ago.   He has had no flank pain or gross hematuria.   he has no dysuria but he does have some urgency and UUI.   He has a history of a UTI in 2/14 and had another in 7/14 that was treated with fosfomycin but he doesn't think that helped, and his UA today looks infected.  He has a history of prostate cancer treated in 2008 . Pt is S/P  L percutaneous Nephrolithotomy on 04/27/13.    Subjective Data      Cognition       Balance     End of Session PT - End of Session Equipment Utilized During Treatment: Gait belt Activity Tolerance: Patient limited by fatigue;Patient tolerated treatment well Patient left: in chair;with call bell/phone within reach  Rica Koyanagi  PTA [facility]  Acute  Rehab Pager      [phone]

## 2013-05-02 NOTE — Progress Notes (Signed)
OT Cancellation Note  Patient Details Name: [name] MRN: [id number] DOB: [birth year]   Cancelled Treatment:    Reason Eval/Treat Not Completed: Fatigue/lethargy limiting ability to participate  Betsy Pries 05/02/2013, 1:20 PM

## 2013-05-03 ENCOUNTER — Encounter (HOSPITAL_COMMUNITY)
Admission: RE | Disposition: A | Discharge status: Skilled Nursing Facility | Payer: Self-pay | Source: Ambulatory Visit | Attending: Urology

## 2013-05-03 ENCOUNTER — Encounter (HOSPITAL_COMMUNITY): Payer: Medicare Other | Admitting: Anesthesiology

## 2013-05-03 ENCOUNTER — Inpatient Hospital Stay (HOSPITAL_COMMUNITY): Payer: Medicare Other

## 2013-05-03 ENCOUNTER — Encounter (HOSPITAL_COMMUNITY): Payer: Self-pay | Admitting: Certified Registered"

## 2013-05-03 ENCOUNTER — Ambulatory Visit (HOSPITAL_COMMUNITY): Admission: RE | Admit: 2013-05-03 | Payer: Medicare Other | Source: Ambulatory Visit | Admitting: Urology

## 2013-05-03 ENCOUNTER — Inpatient Hospital Stay (HOSPITAL_COMMUNITY): Payer: Medicare Other | Admitting: Anesthesiology

## 2013-05-03 DIAGNOSIS — N390 Urinary tract infection, site not specified: Secondary | ICD-10-CM

## 2013-05-03 DIAGNOSIS — I10 Essential (primary) hypertension: Secondary | ICD-10-CM

## 2013-05-03 DIAGNOSIS — M7989 Other specified soft tissue disorders: Secondary | ICD-10-CM

## 2013-05-03 DIAGNOSIS — I82409 Acute embolism and thrombosis of unspecified deep veins of unspecified lower extremity: Secondary | ICD-10-CM

## 2013-05-03 DIAGNOSIS — Z86718 Personal history of other venous thrombosis and embolism: Secondary | ICD-10-CM | POA: Diagnosis not present

## 2013-05-03 HISTORY — PX: NEPHROLITHOTOMY: SHX5134

## 2013-05-03 SURGERY — NEPHROLITHOTOMY PERCUTANEOUS SECOND LOOK
Anesthesia: General | Laterality: Left

## 2013-05-03 MED ORDER — ONDANSETRON HCL 4 MG/2ML IJ SOLN
INTRAMUSCULAR | Status: DC | PRN
  Administered 2013-05-03: 4 mg via INTRAVENOUS

## 2013-05-03 MED ORDER — FENTANYL CITRATE 0.05 MG/ML IJ SOLN
INTRAMUSCULAR | Status: DC | PRN
  Administered 2013-05-03 (×2): 50 ug via INTRAVENOUS

## 2013-05-03 MED ORDER — TRAMADOL HCL 50 MG PO TABS: 50.0000 mg | ORAL_TABLET | Freq: Four times a day (QID) | ORAL | Status: DC | PRN

## 2013-05-03 MED ORDER — ENOXAPARIN SODIUM 150 MG/ML ~~LOC~~ SOLN
140.0000 mg | Freq: Once | SUBCUTANEOUS | Status: AC
  Administered 2013-05-04: 140 mg via SUBCUTANEOUS
  Filled 2013-05-03: qty 1

## 2013-05-03 MED ORDER — WARFARIN SODIUM 10 MG PO TABS
10.0000 mg | ORAL_TABLET | Freq: Once | ORAL | Status: AC
  Administered 2013-05-03: 10 mg via ORAL
  Filled 2013-05-03: qty 1

## 2013-05-03 MED ORDER — PROMETHAZINE HCL 25 MG/ML IJ SOLN: 6.2500 mg | INTRAMUSCULAR | Status: DC | PRN

## 2013-05-03 MED ORDER — IOHEXOL 300 MG/ML  SOLN
INTRAMUSCULAR | Status: DC | PRN
  Administered 2013-05-03: 30 mL via INTRAVENOUS

## 2013-05-03 MED ORDER — PROPOFOL 10 MG/ML IV BOLUS
INTRAVENOUS | Status: AC
  Filled 2013-05-03: qty 20

## 2013-05-03 MED ORDER — PHENYLEPHRINE 40 MCG/ML (10ML) SYRINGE FOR IV PUSH (FOR BLOOD PRESSURE SUPPORT)
PREFILLED_SYRINGE | INTRAVENOUS | Status: AC
  Filled 2013-05-03: qty 10

## 2013-05-03 MED ORDER — MEPERIDINE HCL 50 MG/ML IJ SOLN: 6.2500 mg | INTRAMUSCULAR | Status: DC | PRN

## 2013-05-03 MED ORDER — EPHEDRINE SULFATE 50 MG/ML IJ SOLN
INTRAMUSCULAR | Status: AC
  Filled 2013-05-03: qty 1

## 2013-05-03 MED ORDER — GLYCOPYRROLATE 0.2 MG/ML IJ SOLN
INTRAMUSCULAR | Status: AC
  Filled 2013-05-03: qty 1

## 2013-05-03 MED ORDER — NEOSTIGMINE METHYLSULFATE 1 MG/ML IJ SOLN
INTRAMUSCULAR | Status: DC | PRN
  Administered 2013-05-03: 4 mg via INTRAVENOUS

## 2013-05-03 MED ORDER — PHENYLEPHRINE HCL 10 MG/ML IJ SOLN
INTRAMUSCULAR | Status: DC | PRN
  Administered 2013-05-03: 80 ug via INTRAVENOUS

## 2013-05-03 MED ORDER — WARFARIN - PHARMACIST DOSING INPATIENT: Freq: Every day | Status: DC

## 2013-05-03 MED ORDER — SODIUM CHLORIDE 0.9 % IR SOLN
Status: DC | PRN
  Administered 2013-05-03: 6000 mL

## 2013-05-03 MED ORDER — LACTATED RINGERS IV SOLN
INTRAVENOUS | Status: DC
  Administered 2013-05-03: 1000 mL via INTRAVENOUS
  Administered 2013-05-03: 12:00:00 via INTRAVENOUS

## 2013-05-03 MED ORDER — GLYCOPYRROLATE 0.2 MG/ML IJ SOLN
INTRAMUSCULAR | Status: AC
  Filled 2013-05-03: qty 3

## 2013-05-03 MED ORDER — NEOSTIGMINE METHYLSULFATE 1 MG/ML IJ SOLN
INTRAMUSCULAR | Status: AC
  Filled 2013-05-03: qty 10

## 2013-05-03 MED ORDER — HYDROMORPHONE HCL PF 1 MG/ML IJ SOLN: 0.2500 mg | INTRAMUSCULAR | Status: DC | PRN

## 2013-05-03 MED ORDER — PROPOFOL 10 MG/ML IV BOLUS
INTRAVENOUS | Status: DC | PRN
  Administered 2013-05-03: 200 mg via INTRAVENOUS

## 2013-05-03 MED ORDER — ROCURONIUM BROMIDE 100 MG/10ML IV SOLN
INTRAVENOUS | Status: AC
  Filled 2013-05-03: qty 1

## 2013-05-03 MED ORDER — OXYCODONE HCL 5 MG PO TABS: 5.0000 mg | ORAL_TABLET | Freq: Once | ORAL | Status: DC | PRN

## 2013-05-03 MED ORDER — OXYCODONE HCL 5 MG/5ML PO SOLN
5.0000 mg | Freq: Once | ORAL | Status: DC | PRN
  Filled 2013-05-03: qty 5

## 2013-05-03 MED ORDER — ONDANSETRON HCL 4 MG/2ML IJ SOLN
INTRAMUSCULAR | Status: AC
  Filled 2013-05-03: qty 2

## 2013-05-03 MED ORDER — ENOXAPARIN SODIUM 120 MG/0.8ML ~~LOC~~ SOLN
120.0000 mg | Freq: Two times a day (BID) | SUBCUTANEOUS | Status: DC
  Filled 2013-05-03 (×2): qty 0.8

## 2013-05-03 MED ORDER — GLYCOPYRROLATE 0.2 MG/ML IJ SOLN
INTRAMUSCULAR | Status: DC | PRN
  Administered 2013-05-03: 0.2 mg via INTRAVENOUS
  Administered 2013-05-03: 0.6 mg via INTRAVENOUS

## 2013-05-03 MED ORDER — SODIUM CHLORIDE 0.9 % IJ SOLN
INTRAMUSCULAR | Status: AC
  Filled 2013-05-03: qty 10

## 2013-05-03 MED ORDER — LIDOCAINE HCL (CARDIAC) 20 MG/ML IV SOLN
INTRAVENOUS | Status: DC | PRN
  Administered 2013-05-03: 60 mg via INTRAVENOUS

## 2013-05-03 MED ORDER — ENOXAPARIN SODIUM 150 MG/ML ~~LOC~~ SOLN
140.0000 mg | Freq: Once | SUBCUTANEOUS | Status: AC
  Administered 2013-05-03: 140 mg via SUBCUTANEOUS
  Filled 2013-05-03: qty 1

## 2013-05-03 MED ORDER — ROCURONIUM BROMIDE 100 MG/10ML IV SOLN
INTRAVENOUS | Status: DC | PRN
  Administered 2013-05-03: 30 mg via INTRAVENOUS

## 2013-05-03 MED ORDER — FENTANYL CITRATE 0.05 MG/ML IJ SOLN
INTRAMUSCULAR | Status: AC
  Filled 2013-05-03: qty 2

## 2013-05-03 SURGICAL SUPPLY — 49 items
APL SKNCLS STERI-STRIP NONHPOA (GAUZE/BANDAGES/DRESSINGS) ×1
BAG URINE DRAINAGE (UROLOGICAL SUPPLIES) ×1 IMPLANT
BASKET ZERO TIP NITINOL 2.4FR (BASKET) ×3 IMPLANT
BENZOIN TINCTURE PRP APPL 2/3 (GAUZE/BANDAGES/DRESSINGS) ×6 IMPLANT
BLADE SURG 15 STRL LF DISP TIS (BLADE) ×1 IMPLANT
BLADE SURG 15 STRL SS (BLADE) ×3
BSKT STON RTRVL ZERO TP 2.4FR (BASKET) ×1
CATCHER STONE W/TUBE ADAPTER (UROLOGICAL SUPPLIES) ×1 IMPLANT
CATH FOLEY 2W COUNCIL 20FR 5CC (CATHETERS) IMPLANT
CATH FOLEY 2WAY SLVR  5CC 18FR (CATHETERS) ×2
CATH FOLEY 2WAY SLVR 5CC 18FR (CATHETERS) IMPLANT
CATH ROBINSON RED A/P 20FR (CATHETERS) IMPLANT
CATH URET 5FR 28IN OPEN ENDED (CATHETERS) IMPLANT
CATH X-FORCE N30 NEPHROSTOMY (TUBING) ×1 IMPLANT
COVER SURGICAL LIGHT HANDLE (MISCELLANEOUS) ×3 IMPLANT
DRAPE C-ARM 42X120 X-RAY (DRAPES) ×3 IMPLANT
DRAPE CAMERA CLOSED 9X96 (DRAPES) ×3 IMPLANT
DRAPE LINGEMAN PERC (DRAPES) ×3 IMPLANT
DRAPE SURG IRRIG POUCH 19X23 (DRAPES) ×3 IMPLANT
DRSG TEGADERM 8X12 (GAUZE/BANDAGES/DRESSINGS) ×4 IMPLANT
FIBER LASER FLEXIVA 550 (UROLOGICAL SUPPLIES) IMPLANT
GLOVE BIOGEL PI IND STRL 6.5 (GLOVE) IMPLANT
GLOVE BIOGEL PI INDICATOR 6.5 (GLOVE) ×4
GLOVE SURG SS PI 7.5 STRL IVOR (GLOVE) ×4 IMPLANT
GLOVE SURG SS PI 8.0 STRL IVOR (GLOVE) ×2 IMPLANT
GOWN STRL REUS W/TWL XL LVL3 (GOWN DISPOSABLE) ×7 IMPLANT
GUIDEWIRE AMPLAZ .035X145 (WIRE) ×2 IMPLANT
KIT BASIN OR (CUSTOM PROCEDURE TRAY) ×3 IMPLANT
LASER FIBER DISP 1000U (UROLOGICAL SUPPLIES) IMPLANT
MANIFOLD NEPTUNE II (INSTRUMENTS) ×3 IMPLANT
NS IRRIG 1000ML POUR BTL (IV SOLUTION) ×1 IMPLANT
PACK BASIC VI WITH GOWN DISP (CUSTOM PROCEDURE TRAY) ×3 IMPLANT
PAD ABD 7.5X8 STRL (GAUZE/BANDAGES/DRESSINGS) ×6 IMPLANT
PAD ABD 8X10 STRL (GAUZE/BANDAGES/DRESSINGS) ×2 IMPLANT
PROBE LITHOCLAST ULTRA 3.8X403 (UROLOGICAL SUPPLIES) ×1 IMPLANT
PROBE PNEUMATIC 1.0MMX570MM (UROLOGICAL SUPPLIES) ×1 IMPLANT
SET IRRIG Y TYPE TUR BLADDER L (SET/KITS/TRAYS/PACK) ×3 IMPLANT
SET WARMING FLUID IRRIGATION (MISCELLANEOUS) ×1 IMPLANT
SPONGE GAUZE 4X4 12PLY (GAUZE/BANDAGES/DRESSINGS) ×3 IMPLANT
SPONGE LAP 4X18 X RAY DECT (DISPOSABLE) ×3 IMPLANT
STONE CATCHER W/TUBE ADAPTER (UROLOGICAL SUPPLIES) IMPLANT
SUT SILK 2 0 30  PSL (SUTURE) ×2
SUT SILK 2 0 30 PSL (SUTURE) ×1 IMPLANT
SYR 20CC LL (SYRINGE) ×4 IMPLANT
SYRINGE 10CC LL (SYRINGE) ×3 IMPLANT
TOWEL OR NON WOVEN STRL DISP B (DISPOSABLE) ×3 IMPLANT
TRAY FOLEY CATH 14FRSI W/METER (CATHETERS) ×3 IMPLANT
TUBING CONNECTING 10 (TUBING) ×5 IMPLANT
TUBING CONNECTING 10' (TUBING) ×2

## 2013-05-03 NOTE — Consult Note (Signed)
[facility] Medical Consultation  [name] OAC:[id number] DOB: 1929/04/08 DOA: 04/25/2013 PCP: [name], MD   Requesting physician: Dr. Jeffie Pollock ([facility]) Date of consultation: 05/03/2013 Reason for consultation: LLE swelling   Impression/Recommendations Principal Problem:   Renal stone Active Problems:   DVT, lower extremity   Junctional cardiac arrhythmia   Severe sinus bradycardia   Infection of urinary tract   Thrombocytopenia, unspecified   HPI:  80 year old male with past medical history of hypertension, BPH, history of prostate cancer and radiation treatment who presented to [facility] 04/25/2013 with right renal stone and LLP stone with microhematuria. Pt underwent left percutaneous nephrolithotomy on 05/03/2013. [facility] was consulted due to concern for his LLE swelling. No reports of shortness of breath or chest pain. No palpitations.  Doppler study revealed left lower extremity DVT.   Assessment and Plan:  Principal Problem:   Renal stone - status post left percutaneous nephrolithotomy done 2/19 - management per GU  Active Problems: DVT left lower extremity - Start Lovenox and coumadin per pharmacy - Pt is at relatively moderate risk of bleed considering his initial presentation as microhematuria and now having had perc nephrolithotomy.  He needs to be monitored closely and if any signs of bleed he likely will need IVC filter placement.  - he had SCD's prior to this event which is adequate for DVT prevention. This was extensively discussed with the patient's family. I also mentioned to them his history of prostate cancer puts him at higher risk of blood clots. Family seemed to be understanding of this. Dyslipidemia - continue statin therapy  Leisa Lenz [facility] [phone]   Will follow with you again tomorrow.  Review of Systems:  Constitutional: Negative for fever, chills, diaphoresis, activity change, appetite change and fatigue.  HENT: Negative for ear pain,  nosebleeds, congestion, facial swelling, rhinorrhea, neck pain, neck stiffness and ear discharge.   Eyes: Negative for pain, discharge, redness, itching and visual disturbance.  Respiratory: Negative for cough, choking, chest tightness, shortness of breath, wheezing and stridor.   Cardiovascular: Negative for chest pain, palpitations and positive for left leg swelling.  Gastrointestinal: Negative for abdominal distention.  Genitourinary: Negative for dysuria, urgency, frequency, hematuria, flank pain, decreased urine volume, difficulty urinating and dyspareunia.  Musculoskeletal: Negative for back pain, joint swelling, arthralgias and gait problem.  Neurological: Negative for dizziness, tremors, seizures, syncope, facial asymmetry, speech difficulty, weakness, light-headedness, numbness and headaches.  Hematological: Negative for adenopathy. Does not bruise/bleed easily.  Psychiatric/Behavioral: Negative for hallucinations, behavioral problems, confusion, dysphoric mood, decreased concentration and agitation.     Past Medical History  Diagnosis Date  . Hypertension   . BPH (benign prostatic hyperplasia)   . Hyperlipidemia   . Cancer 2008-2009    prostate TREATED WITH RADIATION  . AAA (abdominal aortic aneurysm) 04/2012    STENTING OF AAA IN [place]  . Junctional cardiac arrhythmia     Occurred postoperatively after urologic surgery  . Severe sinus bradycardia     Occurred postoperatively after urologic surgery   Past Surgical History  Procedure Laterality Date  . Abdominal surgery      PART OF COLON REMOVED FOR DIVERTICULITIS  . Appendectomy    . Hernia repair    . Bladder surgery  2008    FOR BLADDER STONE  . Total knee arthroplasty  1990    left  . Abdominal aortic aneurysm repair  04/2012  . Total knee arthroplasty Right 07/26/2012    Procedure: RIGHT TOTAL KNEE ARTHROPLASTY;  Surgeon: Jori Moll  Fransico Setters, MD;  Location: [facility]S;  Service: Orthopedics;  Laterality: Right;  .  Nephrolithotomy Left 04/26/2013    Procedure: LEFT PERCUTANEOUS NEPHROLITHOTOMY ;  Surgeon: [name], MD;  Location: [facility] ORS;  Service: Urology;  Laterality: Left;   Social History:  reports that he quit smoking about 28 years ago. His smoking use included Cigarettes. He smoked 0.00 packs per day. He has never used smokeless tobacco. He reports that he does not drink alcohol or use illicit drugs.  Allergies  Allergen Reactions  . Penicillins Other (See Comments)    HIVES    Family History  Problem Relation Age of Onset  . Lung cancer Father     Prior to Admission medications   Medication Sig Start Date End Date Taking? Authorizing Provider  acetaminophen (TYLENOL) 500 MG tablet Take 1,000 mg by mouth every 6 (six) hours as needed for mild pain or moderate pain.   Yes Historical Provider, MD  aspirin 325 MG tablet Take 325 mg by mouth daily.   Yes Historical Provider, MD  doxazosin (CARDURA) 8 MG tablet Take 8 mg by mouth at bedtime.    Yes Historical Provider, MD  Naphazoline-Glycerin-Zinc Sulf (CLEAR EYES MAXIMUM ITCHY EYE OP) Place 2 drops into the left eye as needed (for reddness in eye).   Yes Historical Provider, MD  simvastatin (ZOCOR) 80 MG tablet Take 80 mg by mouth every evening.   Yes Historical Provider, MD  tolterodine (DETROL LA) 4 MG 24 hr capsule Take 4 mg by mouth 2 (two) times daily.    Yes Historical Provider, MD  traMADol (ULTRAM) 50 MG tablet Take 1 tablet (50 mg total) by mouth every 6 (six) hours as needed for moderate pain. 05/03/13   [name], MD   Physical Exam: Blood pressure 121/81, pulse 92, temperature 98 F (36.7 C), temperature source Oral, resp. rate 20, height 6\' 2"  (1.88 m), weight 127.4 kg (280 lb 13.9 oz), SpO2 94.00%. Filed Vitals:   05/03/13 1424  BP: 121/81  Pulse: 92  Temp: 98 F (36.7 C)  Resp: 20    Physical Exam  Constitutional: Appears well-developed and well-nourished. No distress.  HENT: Normocephalic. External right and left ear  normal. Oropharynx is clear and moist.  Eyes: Conjunctivae and EOM are normal. PERRLA, no scleral icterus.  Neck: Normal ROM. Neck supple. No JVD. No tracheal deviation. No thyromegaly.  CVS: RRR, S1/S2 +, no murmurs, no gallops, no carotid bruit.  Pulmonary: Effort and breath sounds normal, no stridor, rhonchi, wheezes, rales.  Abdominal: Soft. BS +,  no distension, tenderness, rebound or guarding.  Musculoskeletal: Normal range of motion. LLE swelling  Lymphadenopathy: No lymphadenopathy noted, cervical, inguinal. Neuro: Alert. Normal reflexes, muscle tone coordination. No cranial nerve deficit. Skin: Skin is warm and dry. No rash noted. Not diaphoretic. No erythema. No pallor.  Psychiatric: Normal mood and affect. Behavior, judgment, thought content normal.   Labs on Admission:  Basic Metabolic Panel:  Recent Labs Lab 04/27/13 0345  NA 135*  K 4.6  CL 99  CO2 25  GLUCOSE 153*  BUN 21  CREATININE 1.29  CALCIUM 8.8   Liver Function Tests: No results found for this basename: AST, ALT, ALKPHOS, BILITOT, PROT, ALBUMIN,  in the last 168 hours No results found for this basename: LIPASE, AMYLASE,  in the last 168 hours No results found for this basename: AMMONIA,  in the last 168 hours CBC:  Recent Labs Lab 04/27/13 0345 04/28/13 0550 05/02/13 0438  WBC  --  12.9* 8.5  HGB 13.7 13.1 12.1*  HCT 41.5 39.1 36.9*  MCV  --  85.4 85.2  PLT  --  101* 140*   Cardiac Enzymes: No results found for this basename: CKTOTAL, CKMB, CKMBINDEX, TROPONINI,  in the last 168 hours BNP: No components found with this basename: POCBNP,  CBG: No results found for this basename: GLUCAP,  in the last 168 hours  Radiological Exams on Admission: Dg Abd 1 View  05/03/2013   CLINICAL DATA:  Left renal stone.  EXAM: ABDOMEN - 1 VIEW; DG C-ARM 1-60 MIN - NRPT [facility]  COMPARISON:  CT scan dated 04/27/2013  FLUOROSCOPY TIME:  0 min 54 seconds  FINDINGS: Multiple C-arm images demonstrate the percutaneous  nephrostomy as well as a nephrostogram. According to the operative note the stone was fragmented and extracted. Small filling defect in the distal ureter on image number 5 could represent an air bubble or a small stone fragment.  IMPRESSION: Percutaneous nephrostomy with removal of left renal stone performed by Dr. Jeffie Pollock.   Electronically Signed   By: Rozetta Nunnery M.D.   On: 05/03/2013 13:18   Dg C-arm 1-60 Min-no Report  05/03/2013   CLINICAL DATA:  Left renal stone.  EXAM: ABDOMEN - 1 VIEW; DG C-ARM 1-60 MIN - NRPT [facility]  COMPARISON:  CT scan dated 04/27/2013  FLUOROSCOPY TIME:  0 min 54 seconds  FINDINGS: Multiple C-arm images demonstrate the percutaneous nephrostomy as well as a nephrostogram. According to the operative note the stone was fragmented and extracted. Small filling defect in the distal ureter on image number 5 could represent an air bubble or a small stone fragment.  IMPRESSION: Percutaneous nephrostomy with removal of left renal stone performed by Dr. Jeffie Pollock.   Electronically Signed   By: Rozetta Nunnery M.D.   On: 05/03/2013 13:18    Time spent: 30 minutes  Leisa Lenz [facility] Pager [phone]  If 7PM-7AM, please contact night-coverage [email or web address] Password [facility] 05/03/2013, 4:56 PM

## 2013-05-03 NOTE — Progress Notes (Signed)
ANTICOAGULATION CONSULT NOTE - Initial Consult  Pharmacy Consult for enoxaparin and warfarin Indication: DVT  Allergies  Allergen Reactions  . Penicillins Other (See Comments)    HIVES     Patient Measurements: Height: 6\' 2"  (188 cm) Weight: 280 lb 13.9 oz (127.4 kg) IBW/kg (Calculated) : 82.2  Vital Signs: Temp: 98 F (36.7 C) (02/19 1424) Temp src: Oral (02/19 1424) BP: 121/81 mmHg (02/19 1424) Pulse Rate: 92 (02/19 1424)  Labs:  Recent Labs  05/02/13 0438  HGB 12.1*  HCT 36.9*  PLT 140*    Estimated Creatinine Clearance: 63.7 ml/min (by C-G formula based on Cr of 1.29).   Medical History: Past Medical History  Diagnosis Date  . Hypertension   . BPH (benign prostatic hyperplasia)   . Hyperlipidemia   . Cancer 2008-2009    prostate TREATED WITH RADIATION  . AAA (abdominal aortic aneurysm) 04/2012    STENTING OF AAA IN [place]  . Junctional cardiac arrhythmia     Occurred postoperatively after urologic surgery  . Severe sinus bradycardia     Occurred postoperatively after urologic surgery    Medications:  Scheduled:  . atorvastatin  40 mg Oral q1800  . cefTRIAXone (ROCEPHIN)  IV  1 g Intravenous Q24H  . docusate sodium  100 mg Oral BID  . doxazosin  8 mg Oral QHS  . [START ON 05/04/2013] enoxaparin (LOVENOX) injection  120 mg Subcutaneous Q12H  . enoxaparin (LOVENOX) injection  140 mg Subcutaneous Once  . [START ON 05/04/2013] enoxaparin (LOVENOX) injection  140 mg Subcutaneous Once  . fesoterodine  8 mg Oral Daily   Infusions:    Assessment: 59 yoM admitted 2/11 for left percutaneous nephrolithotomy. Stage 1 performed 2/12, stage 2 performed 2/19. Pt with PMH significant for AAA, aneurysm of R iliac artery, microscopic hematuria. Pt found on 2/19 to have LLE DVT and pharmacy has been consulted to dose enoxaparin and warfarin for VTE treatment. Patient on aspirin 325mg  PO daily which has not been continued inpatient.  Baseline INR nml at  1.05  Hgb 12.1, slightly decreased from baseline  Noted patient with thrombocytopenia at baseline (plts~100 in past year per Epic records), at 140k today  No bleeding has been noted  Today is day 1/5 minimum required overlap for VTE treatment  Goal of Therapy:  INR 2-3 Anti-Xa level 0.6-1 units/ml 4hrs after LMWH dose given Monitor platelets by anticoagulation protocol: Yes   Plan:  - enoxaparin 140mg  SQ ~ q12h x 2 doses  - one time doses ordered for tonight and tomorrow AM to adjust timing so patient does not get dose too early in the morning - enoxaparin 120mg  SQ q12h starting 2/20 at 2000 - warfarin 10mg  PO x 1 tonight - daily PT/INR - CBC in AM and at least q72h while inpatient - follow-up warfarin education - monitor for bleeding

## 2013-05-03 NOTE — Progress Notes (Addendum)
Occupational Therapy Note  See plan for surgery today. Will check back.  Pauline Aus [phone]

## 2013-05-03 NOTE — Transfer of Care (Signed)
Immediate Anesthesia Transfer of Care Note  Patient: [name]  Procedure(s) Performed: Procedure(s): 2ND STAGE LEFT PERCUTANEOUS NEPHROLITHOTOMY  (Left)  Patient Location: PACU  Anesthesia Type:General  Level of Consciousness: awake, alert  and oriented  Airway & Oxygen Therapy: Patient Spontanous Breathing and Patient connected to face mask oxygen  Post-op Assessment: Report given to PACU RN and Post -op Vital signs reviewed and stable  Post vital signs: Reviewed and stable  Complications: No apparent anesthesia complications

## 2013-05-03 NOTE — Progress Notes (Addendum)
Reviewed the results of LE doppler which was positive for LLE DVT. Official consult to follow. For now, order placed for lovenox and coumadin per pharmacy to dose for VTE treatment. We need to watch for any signs of bleed especially for hematuria.  Leisa Lenz [facility] [phone] or [phone]

## 2013-05-03 NOTE — Anesthesia Preprocedure Evaluation (Addendum)
Anesthesia Evaluation  Patient identified by MRN, date of birth, ID band Patient awake    Reviewed: Allergy & Precautions, H&P , NPO status , Patient's Chart, lab work & pertinent test results  Airway Mallampati: II TM Distance: >3 FB     Dental  (+) Dental Advisory Given   Pulmonary former smoker,  breath sounds clear to auscultation        Cardiovascular hypertension, Pt. on medications + Peripheral Vascular Disease Rhythm:Regular Rate:Normal     Neuro/Psych negative neurological ROS  negative psych ROS   GI/Hepatic negative GI ROS, Neg liver ROS,   Endo/Other  negative endocrine ROS  Renal/GU Renal disease     Musculoskeletal negative musculoskeletal ROS (+)   Abdominal   Peds  Hematology  (+) anemia ,   Anesthesia Other Findings   Reproductive/Obstetrics negative OB ROS                                                          Anesthesia Evaluation  Patient identified by MRN, date of birth, ID band Patient awake    Reviewed: Allergy & Precautions, H&P , NPO status , Patient's Chart, lab work & pertinent test results  Airway Mallampati: II TM Distance: >3 FB Neck ROM: full    Dental  (+) Edentulous Upper, Edentulous Lower   Pulmonary neg pulmonary ROS, former smoker,  breath sounds clear to auscultation  Pulmonary exam normal       Cardiovascular Exercise Tolerance: Good hypertension, Pt. on medications Rhythm:regular Rate:Normal  AAA stenting   Neuro/Psych Weakness right leg negative neurological ROS  negative psych ROS   GI/Hepatic negative GI ROS, Neg liver ROS,   Endo/Other  negative endocrine ROS  Renal/GU negative Renal ROS  negative genitourinary   Musculoskeletal   Abdominal   Peds  Hematology negative hematology ROS (+)   Anesthesia Other Findings   Reproductive/Obstetrics negative OB ROS                           Anesthesia Physical Anesthesia Plan  ASA: III  Anesthesia Plan: General   Post-op Pain Management:    Induction: Intravenous  Airway Management Planned: Oral ETT  Additional Equipment:   Intra-op Plan:   Post-operative Plan: Extubation in OR  Informed Consent: I have reviewed the patients History and Physical, chart, labs and discussed the procedure including the risks, benefits and alternatives for the proposed anesthesia with the patient or authorized representative who has indicated his/her understanding and acceptance.   Dental Advisory Given  Plan Discussed with: CRNA and Surgeon  Anesthesia Plan Comments:         Anesthesia Quick Evaluation  Anesthesia Physical Anesthesia Plan  ASA: III  Anesthesia Plan: General   Post-op Pain Management:    Induction: Intravenous  Airway Management Planned: Oral ETT  Additional Equipment:   Intra-op Plan:   Post-operative Plan: Extubation in OR  Informed Consent: I have reviewed the patients History and Physical, chart, labs and discussed the procedure including the risks, benefits and alternatives for the proposed anesthesia with the patient or authorized representative who has indicated his/her understanding and acceptance.   Dental advisory given  Plan Discussed with: CRNA  Anesthesia Plan Comments:         Anesthesia Quick Evaluation

## 2013-05-03 NOTE — Progress Notes (Signed)
Physical Therapy Treatment Patient Details Name: [name] MRN: [id number] DOB: June 19, 1929 Today's Date: 05/03/2013 Time: 0802-0826 PT Time Calculation (min): 24 min  PT Assessment / Plan / Recommendation  History of Present Illness Mr. Ploeger returns today in f/u.  He has a history of a 3.69mm right renal stone and a 1.4cm LLP stone with microhematuria.  He thinks he passed a stone on the left about a month ago.   He has had no flank pain or gross hematuria.   he has no dysuria but he does have some urgency and UUI.   He has a history of a UTI in 2/14 and had another in 7/14 that was treated with fosfomycin but he doesn't think that helped, and his UA today looks infected.  He has a history of prostate cancer treated in 2008 . Pt is S/P  L percutaneous Nephrolithotomy on 04/27/13.   PT Comments   Assisted pt OOB to amb this am before his 10:30 procedure.  Son arrived and was concerned about pt's L LE increased swelling.   Noted increased girth of L thigh and calf.  No c/o pain other than "tightness".  Reported to RN.   Follow Up Recommendations  Home health PT     Does the patient have the potential to tolerate intense rehabilitation     Barriers to Discharge        Equipment Recommendations   (EVA walker)    Recommendations for Other Services    Frequency Min 3X/week   Progress towards PT Goals Progress towards PT goals: Progressing toward goals  Plan      Precautions / Restrictions Precautions Precautions: Fall Precaution Comments: L post/lateral drain Restrictions Weight Bearing Restrictions: No   Pertinent Vitals/Pain     Mobility  Bed Mobility Overal bed mobility: Needs Assistance Bed Mobility: Supine to Sit;Sit to Supine Supine to sit: Supervision;Min guard Sit to supine: Min guard;Min assist General bed mobility comments: increased assist to support B LE up onto bed Transfers Overall transfer level: Needs assistance Equipment used:  (EVA wa;ler) Sit to  Stand: Mod assist Stand pivot transfers: Mod assist General transfer comment: increased Ambulation/Gait Ambulation/Gait assistance: Mod assist Ambulation Distance (Feet): 450 Feet Assistive device:  (EVA walker) Gait Pattern/deviations: Step-through pattern Gait velocity: decreased General Gait Details: Used EVA walker for increased support and encourage increaesed amb distance.     PT Goals (current goals can now be found in the care plan section)    Visit Information  Last PT Received On: 05/03/13 Assistance Needed: +1 History of Present Illness: Mr. Marken returns today in f/u.  He has a history of a 3.7mm right renal stone and a 1.4cm LLP stone with microhematuria.  He thinks he passed a stone on the left about a month ago.   He has had no flank pain or gross hematuria.   he has no dysuria but he does have some urgency and UUI.   He has a history of a UTI in 2/14 and had another in 7/14 that was treated with fosfomycin but he doesn't think that helped, and his UA today looks infected.  He has a history of prostate cancer treated in 2008 . Pt is S/P  L percutaneous Nephrolithotomy on 04/27/13.    Subjective Data      Cognition       Balance     End of Session PT - End of Session Equipment Utilized During Treatment: Gait belt Activity Tolerance: Patient tolerated treatment well Patient  left: in bed;with call bell/phone within reach;with family/visitor present Nurse Communication:  (noted increased L LE edema from hip to calf)   Rica Koyanagi  PTA [facility]  Acute  Rehab Pager      [phone]

## 2013-05-03 NOTE — Progress Notes (Signed)
Patient ID: [name], male   DOB: [birth year], 80 y.o.   MRN: [id number]   Mr. Colledge is doing well from the surgery today, but he was sent for venous duplex studies after the procedure and was confirmed to have acute DVT on the left to the groin, despite a lack of tenderness, erythema or Homan's.     I had a long discussion with the family in the presence of the consulting hospitalist about my general practice of avoiding lovenox or heparin in the post op period following a PCNL and relying on PAS hose because of the fresh nephrostomy tract.  We also discussed their observation that his PAS hose were not on regularly and that he was not ambulated as ordered.   PT was consulted over the weekend and he was seen an evaluated on Sunday but not walked again until Tuesday.   He required two nursing staff for ambulatory assistance.  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   That will be about 3 months out on anticoagulation and hopefully he will be able to proceed.   I also told them that he is still at some increased bleeding risk with his second look PCNL today but I didn't redilate the tract so hopefully he will do well.     He has a foley and safety catheter which will be left until at least tomorrow.

## 2013-05-03 NOTE — Brief Op Note (Signed)
04/25/2013 - 05/03/2013  12:22 PM  PATIENT:  [name]  80 y.o. male  PRE-OPERATIVE DIAGNOSIS:  LEFT RENAL STONE   POST-OPERATIVE DIAGNOSIS:  Left renal stone   PROCEDURE:  Procedure(s): 2ND STAGE LEFT PERCUTANEOUS NEPHROLITHOTOMY  <2cm (Left)  SURGEON:  Surgeon(s) and Role:    * [name], MD - Primary  PHYSICIAN ASSISTANT:   ASSISTANTS: none   ANESTHESIA:   general  EBL:  Total I/O In: 1000 [I.V.:1000] Out: 125 [Urine:125]  BLOOD ADMINISTERED:none  DRAINS: 30fr Kompy nephrostomy catheter   LOCAL MEDICATIONS USED:  NONE  SPECIMEN:  Source of Specimen:  stone fragments  DISPOSITION OF SPECIMEN:  to family  COUNTS:  YES  TOURNIQUET:  * No tourniquets in log *  DICTATION: .Other Dictation: Dictation Number [id number]  PLAN OF CARE: Admit to inpatient   PATIENT DISPOSITION:  PACU - hemodynamically stable.   Delay start of Pharmacological VTE agent (>24hrs) due to surgical blood loss or risk of bleeding: not applicable

## 2013-05-03 NOTE — Progress Notes (Signed)
Patient's left leg is significantly larger than right leg (3X's the size). Left message with Dr. Ralene Muskrat nurse letting him know.  Patient denies any pain.

## 2013-05-03 NOTE — Anesthesia Postprocedure Evaluation (Signed)
Anesthesia Post Note  Patient: [name]  Procedure(s) Performed: Procedure(s) (LRB): 2ND STAGE LEFT PERCUTANEOUS NEPHROLITHOTOMY  (Left)  Anesthesia type: General  Patient location: PACU  Post pain: Pain level controlled  Post assessment: Post-op Vital signs reviewed  Last Vitals: BP 121/81  Pulse 92  Temp(Src) 36.7 C (Oral)  Resp 20  Ht 6\' 2"  (1.88 m)  Wt 280 lb 13.9 oz (127.4 kg)  BMI 36.05 kg/m2  SpO2 94%  Post vital signs: Reviewed  Level of consciousness: sedated  Complications: No apparent anesthesia complications

## 2013-05-03 NOTE — Progress Notes (Addendum)
*  Preliminary Results* Bilateral lower extremity venous duplex completed. The right lower extremity is negative for deep vein thrombosis. There is no Baker's cyst on the right.  The left lower extremity is positive for extensive, acute, occlusive deep vein thrombosis involving the left saphenofemoral junction, common femoral, femoral, profunda femoral, popliteal, posterior tibial, and peroneal veins. There is evidence of a left Baker's cyst.  Preliminary results discussed with Suzie, RN and Dr.Wrenn.  05/03/2013 3:09 PM  Maudry Mayhew, RVT, RDCS, RDMS

## 2013-05-04 ENCOUNTER — Encounter (HOSPITAL_COMMUNITY): Payer: Self-pay | Admitting: Urology

## 2013-05-04 LAB — PROTIME-INR
INR: 1.27 (ref 0.00–1.49)
Prothrombin Time: 15.6 seconds — ABNORMAL HIGH (ref 11.6–15.2)

## 2013-05-04 LAB — CBC
HCT: 37.2 % — ABNORMAL LOW (ref 39.0–52.0)
Hemoglobin: 12 g/dL — ABNORMAL LOW (ref 13.0–17.0)
MCH: 27.5 pg (ref 26.0–34.0)
MCHC: 32.3 g/dL (ref 30.0–36.0)
MCV: 85.3 fL (ref 78.0–100.0)
PLATELETS: 162 10*3/uL (ref 150–400)
RBC: 4.36 MIL/uL (ref 4.22–5.81)
RDW: 14.7 % (ref 11.5–15.5)
WBC: 10 10*3/uL (ref 4.0–10.5)

## 2013-05-04 MED ORDER — PATIENT'S GUIDE TO USING COUMADIN BOOK
Freq: Once | Status: AC
  Administered 2013-05-04: 12:00:00
  Filled 2013-05-04: qty 1

## 2013-05-04 MED ORDER — WARFARIN VIDEO
Freq: Once | Status: AC
  Administered 2013-05-04: 12:00:00

## 2013-05-04 MED ORDER — WARFARIN SODIUM 5 MG PO TABS
5.0000 mg | ORAL_TABLET | Freq: Once | ORAL | Status: DC
  Filled 2013-05-04: qty 1

## 2013-05-04 MED ORDER — WARFARIN SODIUM 7.5 MG PO TABS
7.5000 mg | ORAL_TABLET | Freq: Once | ORAL | Status: DC
  Filled 2013-05-04: qty 1

## 2013-05-04 NOTE — Progress Notes (Signed)
Patient having hematuria.  Patient denies any pain or SOB.  Patient urinated 50 cc of urine that looked like dark red blood, no clots visible.  Dr. Edwena Blow on call for Dr. Jeffie Pollock.  Dr. Edwena Blow informed writer to let pharmacy know about patient's hematuria and to recommend the lowest dose possible for patient to have tonight and for coumadin not to be started. Notified Larkin Ina, PharmD of patient's status and of what Dr. Edwena Blow ordered.  Larkin Ina, PharmD said he would discontinue coumadin and since patient received lovenox this am, that he would discontinue the lovenox dose ordered for tonight and write a note to re-evaluate in am.  Will continue to monitor patient.

## 2013-05-04 NOTE — Progress Notes (Signed)
Pharmacy Consult Note - Lovenox/Warfarin Follow Up  A/P: Per nursing and PharmD from first shift, patient had significant hematuria this afternoon. Note patient admitted 2/11 for left percutaneous nephrolithotomy with stage 1 performed on 2/12 and state 2 performed 2/19. Full dose Lovenox dose of 140mg  given last PM and again this AM at 0618 and dose of warfarin given at 1900 2/19. Plan was to reduce dose of warfarin to 5mg  tonight given hematuria but per Dr. Karsten Ro we are to hold warfarin tonight and reduce Lovenox to "lowest possible dose." Well, this would mean to reduce to prophylactic dose for time being. The dose for this patient for prophylactic dose would be 60mg  (0.5mg /kg) q24, but with patient already receiving 140mg  earlier today this dose will not be started tonight. Will re-evaluate patient in AM and contact Md if Lovenox can safely be started at either prophylactic dose or restart full dose.  Adrian Saran, PharmD, BCPS Pager [phone] 05/04/2013 4:54 PM

## 2013-05-04 NOTE — Progress Notes (Signed)
Pt selected [facility] for [facility] needs. Referral called to in house Rep with [facility].

## 2013-05-04 NOTE — Consult Note (Signed)
[facility] PROGRESS NOTE  [name] YPP:[id number] DOB: May 01, 1929 DOA: 04/25/2013 PCP: [name], MD  Brief narrative: 80 year old male with past medical history of hypertension, BPH, history of prostate cancer and radiation treatment who presented to [facility] 04/25/2013 with right renal stone and LLP stone with microhematuria. Pt underwent left percutaneous nephrolithotomy on 05/03/2013. [facility] was consulted due to concern for his LLE swelling. Doppler study revealed left lower extremity DVT.   Assessment and Plan:   Principal Problem:  Renal stone  - status post left percutaneous nephrolithotomy done 2/19  - management per GU  Active Problems:  DVT left lower extremity  - Continue Lovenox and coumadin per pharmacy  - as already mentioned, pt is at moderate risk of bleed considering his comorbidities. Please continue to monitor for any signs of bleed including but not limited to hematuria Dyslipidemia  - continue statin therapy    Code Status: full code Family Communication: family not at the bedside Disposition Plan: home when stable; per primary team  Leisa Lenz, MD  [facility] Pager [phone]  If 7PM-7AM, please contact night-coverage [email or web address] Password [email or web address] 05/04/2013, 12:19 PM   LOS: 9 days    HPI/Subjective: No overnight events.   Objective: Filed Vitals:   05/03/13 1424 05/03/13 2119 05/04/13 0623 05/04/13 1100  BP: 121/81 146/71 135/71 112/68  Pulse: 92 103 90 77  Temp: 98 F (36.7 C) 99.8 F (37.7 C) 98.6 F (37 C) 99 F (37.2 C)  TempSrc: Oral Oral Oral Oral  Resp: 20 18 22 18   Height:      Weight:      SpO2: 94% 96% 94% 98%    Intake/Output Summary (Last 24 hours) at 05/04/13 1219 Last data filed at 05/04/13 0900  Gross per 24 hour  Intake   1110 ml  Output    925 ml  Net    185 ml    Exam:   General:  Pt is alert, follows commands appropriately, not in acute distress  Cardiovascular: Regular rate and rhythm,  S1/S2 appreciated  Respiratory: Clear to auscultation bilaterally, no wheezing, no crackles, no rhonchi  Abdomen: Soft, non tender, non distended, bowel sounds present, no guarding  Extremities: LLE edema, pulses DP and PT palpable bilaterally  Neuro: Grossly nonfocal  Data Reviewed: Basic Metabolic Panel: No results found for this basename: NA, K, CL, CO2, GLUCOSE, BUN, CREATININE, CALCIUM, MG, PHOS,  in the last 168 hours Liver Function Tests: No results found for this basename: AST, ALT, ALKPHOS, BILITOT, PROT, ALBUMIN,  in the last 168 hours No results found for this basename: LIPASE, AMYLASE,  in the last 168 hours No results found for this basename: AMMONIA,  in the last 168 hours CBC:  Recent Labs Lab 04/28/13 0550 05/02/13 0438 05/04/13 0415  WBC 12.9* 8.5 10.0  HGB 13.1 12.1* 12.0*  HCT 39.1 36.9* 37.2*  MCV 85.4 85.2 85.3  PLT 101* 140* 162   Cardiac Enzymes: No results found for this basename: CKTOTAL, CKMB, CKMBINDEX, TROPONINI,  in the last 168 hours BNP: No components found with this basename: POCBNP,  CBG: No results found for this basename: GLUCAP,  in the last 168 hours  No results found for this or any previous visit (from the past 240 hour(s)).   Studies: Dg Abd 1 View  05/03/2013   CLINICAL DATA:  Left renal stone.  EXAM: ABDOMEN - 1 VIEW; DG C-ARM 1-60 MIN - NRPT [facility]  COMPARISON:  CT scan dated 04/27/2013  FLUOROSCOPY TIME:  0 min 54 seconds  FINDINGS: Multiple C-arm images demonstrate the percutaneous nephrostomy as well as a nephrostogram. According to the operative note the stone was fragmented and extracted. Small filling defect in the distal ureter on image number 5 could represent an air bubble or a small stone fragment.  IMPRESSION: Percutaneous nephrostomy with removal of left renal stone performed by Dr. Jeffie Pollock.   Electronically Signed   By: Rozetta Nunnery M.D.   On: 05/03/2013 13:18   Dg C-arm 1-60 Min-no Report  05/03/2013   CLINICAL DATA:   Left renal stone.  EXAM: ABDOMEN - 1 VIEW; DG C-ARM 1-60 MIN - NRPT [facility]  COMPARISON:  CT scan dated 04/27/2013  FLUOROSCOPY TIME:  0 min 54 seconds  FINDINGS: Multiple C-arm images demonstrate the percutaneous nephrostomy as well as a nephrostogram. According to the operative note the stone was fragmented and extracted. Small filling defect in the distal ureter on image number 5 could represent an air bubble or a small stone fragment.  IMPRESSION: Percutaneous nephrostomy with removal of left renal stone performed by Dr. Jeffie Pollock.   Electronically Signed   By: Rozetta Nunnery M.D.   On: 05/03/2013 13:18    Scheduled Meds: . atorvastatin  40 mg Oral q1800  . docusate sodium  100 mg Oral BID  . doxazosin  8 mg Oral QHS  . enoxaparin (LOVENOX) injection  120 mg Subcutaneous Q12H  . fesoterodine  8 mg Oral Daily  . warfarin  7.5 mg Oral ONCE-1800  . Warfarin - Pharmacist Dosing Inpatient   Does not apply q1800   Continuous Infusions:

## 2013-05-04 NOTE — Progress Notes (Signed)
OT Cancellation Note  Patient Details Name: JUVENCIO VERDI MRN: [id number] DOB: [birth year]   Cancelled Treatment:    Reason Eval/Treat Not Completed: Other (comment)  Pt had surgery yesterday and is now on bedrest.  Will check back tomorrow.    Abby Stines 05/04/2013, 1:24 PM Lesle Chris, OTR/L [phone] 05/04/2013

## 2013-05-04 NOTE — Progress Notes (Signed)
Patient refused to order lunch.  Patient appears to be sleeping.

## 2013-05-04 NOTE — Progress Notes (Signed)
Patient resting upon entering room.  Patient encouraged to order lunch and patient states " I do not want to order lunch or eat anything for lunch."  I informed patient's daughter of what patient said and daughter informed writer that patient's appetite is not good and he does not normally eat lunch.

## 2013-05-04 NOTE — Progress Notes (Signed)
Family is visiting with patient.  Patient is lying in bed and appears to be content with visit.

## 2013-05-04 NOTE — Progress Notes (Signed)
Physical Therapy Treatment Patient Details Name: [name] MRN: [id number] DOB: 01-28-1930 Today's Date: 05/04/2013 Time: 6237-6283 PT Time Calculation (min): 26 min  PT Assessment / Plan / Recommendation  History of Present Illness [name] returns today in f/u.  He has a history of a 3.79mm right renal stone and a 1.4cm LLP stone with microhematuria.  He thinks he passed a stone on the left about a month ago.   He has had no flank pain or gross hematuria.   he has no dysuria but he does have some urgency and UUI.   He has a history of a UTI in 2/14 and had another in 7/14 that was treated with fosfomycin but he doesn't think that helped, and his UA today looks infected.  He has a history of prostate cancer treated in 2008 . Pt is S/P  L percutaneous Nephrolithotomy on 04/27/13.   PT Comments   Pt underwent 2nd procedure yeasterday and feeling alittle faitgued but very willing to walk.  onlt able to tolerate 1/2 his usual distance due to c/o L LE "tightness" and general fatigue.   Follow Up Recommendations  Home health PT (Pt declining any SNF rec)     Does the patient have the potential to tolerate intense rehabilitation     Barriers to Discharge        Equipment Recommendations       Recommendations for Other Services    Frequency Min 3X/week   Progress towards PT Goals Progress towards PT goals: Progressing toward goals  Plan      Precautions / Restrictions Precautions Precautions: Fall Restrictions Weight Bearing Restrictions: No   Pertinent Vitals/Pain No c/o pain just L LE tightness esp in calf    Mobility  Bed Mobility Overal bed mobility: Needs Assistance Bed Mobility: Supine to Sit;Sit to Supine Supine to sit: Supervision;Min guard Sit to supine: Min guard;Min assist General bed mobility comments: increased assist to support B LE up onto bed Transfers Overall transfer level: Needs assistance Equipment used:  Associate Professor) Sit to Stand: Mod assist Stand  pivot transfers: Mod assist General transfer comment: increased time Ambulation/Gait Ambulation/Gait assistance: Mod assist Ambulation Distance (Feet): 225 Feet Assistive device:  (EVA walker) Gait velocity: decreased General Gait Details: Used EVA walker for increased support however pt only able to tolerate 1/2 his usual distance due to c/o B LE weakness and "tightness" L LE     PT Goals (current goals can now be found in the care plan section)    Visit Information  Last PT Received On: 05/04/13 Assistance Needed: +1 History of Present Illness: [name] returns today in f/u.  He has a history of a 3.22mm right renal stone and a 1.4cm LLP stone with microhematuria.  He thinks he passed a stone on the left about a month ago.   He has had no flank pain or gross hematuria.   he has no dysuria but he does have some urgency and UUI.   He has a history of a UTI in 2/14 and had another in 7/14 that was treated with fosfomycin but he doesn't think that helped, and his UA today looks infected.  He has a history of prostate cancer treated in 2008 . Pt is S/P  L percutaneous Nephrolithotomy on 04/27/13.    Subjective Data      Cognition       Balance     End of Session PT - End of Session Equipment Utilized During Treatment: Gait  belt Activity Tolerance: Patient limited by fatigue Patient left: in bed;with call bell/phone within reach;with family/visitor present   Rica Koyanagi  PTA [facility]  Acute  Rehab Pager      [phone]

## 2013-05-04 NOTE — Progress Notes (Signed)
Family at bedside.  Encouraged patient to drink fluids and patient verbalized he understood and said he would like to have orange juice.  Patient given 240 cc of orange juice to drink.  Will continue to encourage fluids.  Patient and patient's family watching coumadin video and given coumadin handout.  Patient's daughter verbalized understanding of coumadin and states " we know a lot about coumadin because several family members have been on it."  Patient and family told if they have any questions to please let writer know.

## 2013-05-04 NOTE — Progress Notes (Addendum)
1 Day Post-Op  Subjective: Mr. Bunch report no complaints this morning.  He is POD 1 from a second look left PCNL and his stones were all retrieved.  He is POD 7 from his initial left PCNL.   He developed a left DVT and was seen by Dr. Doyle Askew yesterday and has been started on Lovenox with transition to warfarin.  His urine is clearing this morning.   The left leg remains swollen but is improving per report.  ROS: Negative except as above.  He has no flank pain or bleeding.  He has no CP or SOB.   Objective: Vital signs in last 24 hours: Temp:  [98 F (36.7 C)-99.8 F (37.7 C)] 98.6 F (37 C) (02/20 0623) Pulse Rate:  [86-103] 90 (02/20 0623) Resp:  [18-22] 22 (02/20 0623) BP: (117-151)/(71-103) 135/71 mmHg (02/20 0623) SpO2:  [93 %-100 %] 94 % (02/20 0623)  Intake/Output from previous day: 02/19 0701 - 02/20 0700 In: 1870 [P.O.:120; I.V.:1750] Out: 600 [Urine:600] Intake/Output this shift:    General appearance: alert and no distress Resp: clear to auscultation bilaterally Cardio: regular rate and rhythm GI: soft, NT.  The NT site has minimal bloody drainage.  The safety catheter was removed and the dressing was replaced.  Extremities: He has persistent LLE swelling but no calf tenderness.   The right leg is normal and has a PAS hose in place.   Lab Results:   Recent Labs  05/02/13 0438 05/04/13 0415  WBC 8.5 10.0  HGB 12.1* 12.0*  HCT 36.9* 37.2*  PLT 140* 162   BMET No results found for this basename: NA, K, CL, CO2, GLUCOSE, BUN, CREATININE, CALCIUM,  in the last 72 hours PT/INR  Recent Labs  05/04/13 0415  LABPROT 15.6*  INR 1.27   ABG No results found for this basename: PHART, PCO2, PO2, HCO3,  in the last 72 hours  Studies/Results: Dg Abd 1 View  05/03/2013   CLINICAL DATA:  Left renal stone.  EXAM: ABDOMEN - 1 VIEW; DG C-ARM 1-60 MIN - NRPT [facility]  COMPARISON:  CT scan dated 04/27/2013  FLUOROSCOPY TIME:  0 min 54 seconds  FINDINGS: Multiple C-arm images  demonstrate the percutaneous nephrostomy as well as a nephrostogram. According to the operative note the stone was fragmented and extracted. Small filling defect in the distal ureter on image number 5 could represent an air bubble or a small stone fragment.  IMPRESSION: Percutaneous nephrostomy with removal of left renal stone performed by Dr. Jeffie Pollock.   Electronically Signed   By: Rozetta Nunnery M.D.   On: 05/03/2013 13:18   Dg C-arm 1-60 Min-no Report  05/03/2013   CLINICAL DATA:  Left renal stone.  EXAM: ABDOMEN - 1 VIEW; DG C-ARM 1-60 MIN - [facility]PT [facility]  COMPARISON:  CT scan dated 04/27/2013  FLUOROSCOPY TIME:  0 min 54 seconds  FINDINGS: Multiple C-arm images demonstrate the percutaneous nephrostomy as well as a nephrostogram. According to the operative note the stone was fragmented and extracted. Small filling defect in the distal ureter on image number 5 could represent an air bubble or a small stone fragment.  IMPRESSION: Percutaneous nephrostomy with removal of left renal stone performed by Dr. Jeffie Pollock.   Electronically Signed   By: Rozetta Nunnery M.D.   On: 05/03/2013 13:18    Anti-infectives: Anti-infectives   Start     Dose/Rate Route Frequency Ordered Stop   05/03/13 0600  cefTRIAXone (ROCEPHIN) 2 g in dextrose 5 % 50 mL IVPB  2 g 100 mL/hr over 30 Minutes Intravenous 30 min pre-op 05/02/13 0902 05/03/13 1112   04/28/13 1200  cefTRIAXone (ROCEPHIN) 1 g in dextrose 5 % 50 mL IVPB     1 g 100 mL/hr over 30 Minutes Intravenous Every 24 hours 04/27/13 1040     04/26/13 1800  tobramycin (NEBCIN) 700 mg in dextrose 5 % 100 mL IVPB  Status:  Discontinued     700 mg 117.5 mL/hr over 60 Minutes Intravenous Every 24 hours 04/26/13 1731 04/27/13 0710   04/26/13 0853  ciprofloxacin (CIPRO) 400 MG/200ML IVPB    Comments:  Margaretmary Dys   : cabinet override      04/26/13 0853 04/26/13 0943   04/26/13 0730  ciprofloxacin (CIPRO) IVPB 400 mg     400 mg 200 mL/hr over 60 Minutes Intravenous On call  04/25/13 1423 04/26/13 1029   04/25/13 1730  [MAR Hold]  tobramycin (NEBCIN) 700 mg in dextrose 5 % 100 mL IVPB  Status:  Discontinued     (On MAR Hold since 04/26/13 1014)   700 mg 117.5 mL/hr over 60 Minutes Intravenous Every 24 hours 04/25/13 1637 04/26/13 1637   04/25/13 1415  tobramycin (NEBCIN) 60 mg in dextrose 5 % 50 mL IVPB  Status:  Discontinued    Comments:  PER PHARMACY DOSING   60 mg 103 mL/hr over 30 Minutes Intravenous  Once 04/25/13 1409 04/25/13 1419      Current Facility-Administered Medications  Medication Dose Route Frequency Provider Last Rate Last Dose  . acetaminophen (TYLENOL) tablet 650 mg  650 mg Oral Q4H PRN [name], MD   650 mg at 04/28/13 2127  . atorvastatin (LIPITOR) tablet 40 mg  40 mg Oral q1800 [name], MD   40 mg at 05/03/13 1709  . bisacodyl (DULCOLAX) suppository 10 mg  10 mg Rectal Daily PRN [name], MD      . cefTRIAXone (ROCEPHIN) 1 g in dextrose 5 % 50 mL IVPB  1 g Intravenous Q24H [name], MD   1 g at 05/02/13 1200  . diphenhydrAMINE (BENADRYL) injection 12.5 mg  12.5 mg Intravenous Q6H PRN [name], MD       Or  . diphenhydrAMINE (BENADRYL) 12.5 MG/5ML elixir 12.5 mg  12.5 mg Oral Q6H PRN [name], MD   12.5 mg at 04/30/13 1828  . docusate sodium (COLACE) capsule 100 mg  100 mg Oral BID [name], MD   100 mg at 05/03/13 2237  . doxazosin (CARDURA) tablet 8 mg  8 mg Oral QHS [name], MD   8 mg at 05/03/13 2237  . enoxaparin (LOVENOX) injection 120 mg  120 mg Subcutaneous Q12H Mosetta Pigeon, [facility]      . fesoterodine (TOVIAZ) tablet 8 mg  8 mg Oral Daily [name], MD   8 mg at 05/02/13 1125  . HYDROmorphone (DILAUDID) injection 0.5-1 mg  0.5-1 mg Intravenous Q2H PRN [name], MD   1 mg at 05/01/13 2215  . ondansetron (ZOFRAN) injection 4 mg  4 mg Intravenous Q4H PRN [name], MD   4 mg at 04/27/13 0550  . oxyCODONE-acetaminophen (PERCOCET/ROXICET) 5-325 MG per tablet 1-2 tablet  1-2 tablet Oral Q4H PRN [name], MD   2  tablet at 05/03/13 2237  . traMADol (ULTRAM) tablet 50 mg  50 mg Oral QID PRN [name], MD      . Warfarin - Pharmacist Dosing Inpatient   Does not apply [place], [facility]  Assessment: s/p Procedure(s): 2ND STAGE LEFT PERCUTANEOUS NEPHROLITHOTOMY   He is doing well with some improvement in the LLE edema on Lovenox.   Plan: D/C foley. Safety catheter removed. Continue lovenox with transition to warfarin. Will resume ambulation later today.  I will discontinue the rocephin.    LOS: 9 days    Baljit Liebert J 05/04/2013

## 2013-05-04 NOTE — Progress Notes (Signed)
Nutrition Services  Patient screened for LOS Day 9. Pt reported slight decrease in appetite during admit d/t dislike of hospital foods, but is still consuming 75% of meals. Family will occasionally bring in foods from outside sources. Denied any changes in appetite or weight pta.  No nutrition interventions warranted at this time. Please consult as needed  [place] Clinical Dietitian [phone]

## 2013-05-04 NOTE — Progress Notes (Signed)
I was contacted regarding [name] who underwent a second stage percutaneous nephrolithotomy yesterday.  His nephrostomy catheter was removed as well as his Foley catheter.  He developed a DVT and was started on Lovenox yesterday.  He was due to begin his first dose of Coumadin this evening.  He began having gross hematuria but was not reporting any flank pain.  At this point my concern is that of bleeding from his kidney however without flank pain it would indicate the bleeding is most likely into the area of the renal pelvis.  He is not having any clot colic at this time and is not having difficulty voiding.  I have asked that the pharmacist be contacted in order to determine the lowest dose Lovenox that could be administered protect against a PE.  I also felt that rather then begin Coumadin this evening this should be postponed so that the Lovenox could be stopped if some form of procedure was required.  With bed rest and time my hope is that the bleeding stopped spontaneously.  If it does not stop while the patient is on some form of anticoagulation he will likely need to undergo a vena caval filter placement and have all anticoagulation stopped until the bleeding ceases.  Since he is voiding spontaneously a catheter will not be replaced but may be necessary if he develops clot retention.  I will also monitor his H&H.

## 2013-05-04 NOTE — Progress Notes (Signed)
Urinary catheter was removed via Dr order. Bleeding and brown urine was observed and measured at 350 mL.   One dime size clot was present upon removal.

## 2013-05-04 NOTE — Progress Notes (Addendum)
ANTICOAGULATION CONSULT NOTE - Follow Up Consult  Pharmacy Consult for warfarin/enoxaparin Indication: acute DVT  Allergies  Allergen Reactions  . Penicillins Other (See Comments)    HIVES     Patient Measurements: Height: 6\' 2"  (188 cm) Weight: 280 lb 13.9 oz (127.4 kg) IBW/kg (Calculated) : 82.2 Heparin Dosing Weight:   Vital Signs: Temp: 98.6 F (37 C) (02/20 0623) Temp src: Oral (02/20 0623) BP: 135/71 mmHg (02/20 0623) Pulse Rate: 90 (02/20 0623)  Labs:  Recent Labs  05/02/13 0438 05/04/13 0415  HGB 12.1* 12.0*  HCT 36.9* 37.2*  PLT 140* 162  LABPROT  --  15.6*  INR  --  1.27    Estimated Creatinine Clearance: 63.7 ml/min (by C-G formula based on Cr of 1.29).   Assessment: 61 yoM admitted 2/11 for left percutaneous nephrolithotomy. Stage 1 performed 2/12, stage 2 performed 2/19. Pt with PMH significant for AAA, aneurysm of R iliac artery, microscopic hematuria. Pt found on 2/19 to have LLE DVT and pharmacy has been consulted to dose enoxaparin and warfarin for VTE treatment. Patient on aspirin 325mg  PO daily which has not been continued inpatient.  Today's INR = 1.27 following 1st dose of coumadin of 10mg  last pm Hgb 12, slightly decreased from baseline. Platelets = 162 (improved). Noted patient with thrombocytopenia at baseline (plts~100 in past year per Epic records) No bleeding has been noted  Today is day 2/5 minimum required overlap for VTE treatment  Goal of Therapy:  INR 2-3 Anti-Xa level 0.6-1 units/ml 4hrs after LMWH dose given Monitor platelets by anticoagulation protocol: Yes   Plan:  Today is day 2/5 minimum required overlap for VTE treatment  Coumadin 7.5mg  po x 1 tonight  Continue enoxaparin 120mg  SQ q12h  CBC q72h  Daily INR  Doreene Eland, PharmD, BCPS.   Pager: [phone]  05/04/2013,10:42 AM   Addendum:  Due to hematuria - reduce warfarin dose to 5mg  tonight RN calling urology, may need to hold anticoagulation or change to  heparin gtt for now Patient educated re: warfarin   Doreene Eland, PharmD, BCPS.   Pager: [phone] 05/04/2013 4:21 PM

## 2013-05-04 NOTE — Progress Notes (Signed)
I have reviewed all documentation from student nurse. 

## 2013-05-04 NOTE — Progress Notes (Deleted)
Patient ID: [name], male   DOB: 1929-10-23, 80 y.o.   MRN: [id number]

## 2013-05-05 ENCOUNTER — Inpatient Hospital Stay (HOSPITAL_COMMUNITY): Payer: Medicare Other

## 2013-05-05 DIAGNOSIS — M7989 Other specified soft tissue disorders: Secondary | ICD-10-CM

## 2013-05-05 LAB — BASIC METABOLIC PANEL
BUN: 25 mg/dL — AB (ref 6–23)
CHLORIDE: 100 meq/L (ref 96–112)
CO2: 24 meq/L (ref 19–32)
Calcium: 8.8 mg/dL (ref 8.4–10.5)
Creatinine, Ser: 1.43 mg/dL — ABNORMAL HIGH (ref 0.50–1.35)
GFR calc Af Amer: 51 mL/min — ABNORMAL LOW (ref >90)
GFR calc non Af Amer: 44 mL/min — ABNORMAL LOW (ref >90)
Glucose, Bld: 120 mg/dL — ABNORMAL HIGH (ref 70–99)
Potassium: 4.7 mEq/L (ref 3.7–5.3)
Sodium: 136 mEq/L — ABNORMAL LOW (ref 137–147)

## 2013-05-05 LAB — HEMOGLOBIN AND HEMATOCRIT, BLOOD
HCT: 35 % — ABNORMAL LOW (ref 39.0–52.0)
Hemoglobin: 11.2 g/dL — ABNORMAL LOW (ref 13.0–17.0)

## 2013-05-05 LAB — PROTIME-INR
INR: 1.41 (ref 0.00–1.49)
Prothrombin Time: 16.9 seconds — ABNORMAL HIGH (ref 11.6–15.2)

## 2013-05-05 MED ORDER — ENOXAPARIN SODIUM 40 MG/0.4ML ~~LOC~~ SOLN
40.0000 mg | SUBCUTANEOUS | Status: DC
  Administered 2013-05-05: 40 mg via SUBCUTANEOUS
  Filled 2013-05-05 (×2): qty 0.4

## 2013-05-05 NOTE — Progress Notes (Signed)
MD on call, notified that pt was having more flank pain and the area was swollen and warm to touch. Pt medicated with IV Dilaudid and Zofran given for nausea. Temp 99.3 and cont to void bloody urine. Will cont to monitor pt.

## 2013-05-05 NOTE — Progress Notes (Addendum)
Patient ID: [name], male   DOB: September 27, 1929, 80 y.o.   MRN: [id number]  Pt without complaints. Voiding well now.  Filed Vitals:   05/05/13 0456  BP: 119/76  Pulse: 87  Temp: 97.9 F (36.6 C)  Resp: 18    Intake/Output Summary (Last 24 hours) at 05/05/13 1950 Last data filed at 05/05/13 0244  Gross per 24 hour  Intake    600 ml  Output   1125 ml  Net   -525 ml    PE: NAD abd soft, NT Left flank non-tender , no obvious fluid collection, no erythema or ecchymosis  H/H down slightly Cr up just a bit from baseline  Imp - s/p left PCNL and second look - slight decrease in h/h, bump in Cr, flank pain, gross hematuria - he's not in any distress but given his complex medical situation he needs to be staged with CT a/p to check for hydronephrosis, hematoma, urinoma, clot in collecting system / ureter so that we can confidently treat him with anticoagulation.   As seen from the pharmacist note -- Lovenox on hold until 6 pm.   Will get CT  A/p

## 2013-05-05 NOTE — Progress Notes (Signed)
2 Days Post-Op Subjective: Patient reports having had some flank pain last night requiring pain medication however he currently is free of any flank pain or abdominal/suprapubic pain. He is voiding without difficulty. His urine remains somewhat bloody. No nausea, vomiting, fever or pulmonary complaint.  Objective: Vital signs in last 24 hours: Temp:  [97.9 F (36.6 C)-99.3 F (37.4 C)] 97.9 F (36.6 C) (02/21 0456) Pulse Rate:  [77-96] 87 (02/21 0456) Resp:  [18-19] 18 (02/21 0456) BP: (105-136)/(68-87) 119/76 mmHg (02/21 0456) SpO2:  [90 %-98 %] 94 % (02/21 0456)  Intake/Output from previous day: 02/20 0701 - 02/21 0700 In: 600 [P.O.:600] Out: 1125 [Urine:1125] Intake/Output this shift:    Physical Exam:  General:alert, cooperative and no distress GI: not done and soft, non tender, normal bowel sounds, no palpable masses.  No CVAT or flank ecchymoses. Lab Results:  Recent Labs  05/04/13 0415 05/05/13 0353  HGB 12.0* 11.2*  HCT 37.2* 35.0*   BMET  Recent Labs  05/05/13 0353  NA 136*  K 4.7  CL 100  CO2 24  GLUCOSE 120*  BUN 25*  CREATININE 1.43*  CALCIUM 8.8    Recent Labs  05/04/13 0415 05/05/13 0353  INR 1.27 1.41   No results found for this basename: LABURIN,  in the last 72 hours Results for orders placed during the hospital encounter of 07/18/12  SURGICAL PCR SCREEN     Status: None   Collection Time    07/18/12  1:05 PM      Result Value Ref Range Status   MRSA, PCR NEGATIVE  NEGATIVE Final   Staphylococcus aureus NEGATIVE  NEGATIVE Final   Comment:            The Xpert SA Assay (FDA     approved for NASAL specimens     in patients over 43 years of age),     is one component of     a comprehensive surveillance     program.  Test performance has     been validated by [employer] for patients greater     than or equal to 4 year old.     It is not intended     to diagnose infection nor to     guide or monitor treatment.     Studies/Results: Dg Abd 1 View  05/03/2013   CLINICAL DATA:  Left renal stone.  EXAM: ABDOMEN - 1 VIEW; DG C-ARM 1-60 MIN - NRPT [facility]  COMPARISON:  CT scan dated 04/27/2013  FLUOROSCOPY TIME:  0 min 54 seconds  FINDINGS: Multiple C-arm images demonstrate the percutaneous nephrostomy as well as a nephrostogram. According to the operative note the stone was fragmented and extracted. Small filling defect in the distal ureter on image number 5 could represent an air bubble or a small stone fragment.  IMPRESSION: Percutaneous nephrostomy with removal of left renal stone performed by Dr. Jeffie Pollock.   Electronically Signed   By: Rozetta Nunnery M.D.   On: 05/03/2013 13:18   Dg C-arm 1-60 Min-no Report  05/03/2013   CLINICAL DATA:  Left renal stone.  EXAM: ABDOMEN - 1 VIEW; DG C-ARM 1-60 MIN - [facility]PT [facility]  COMPARISON:  CT scan dated 04/27/2013  FLUOROSCOPY TIME:  0 min 54 seconds  FINDINGS: Multiple C-arm images demonstrate the percutaneous nephrostomy as well as a nephrostogram. According to the operative note the stone was fragmented and extracted. Small filling defect in the distal ureter on image number 5  could represent an air bubble or a small stone fragment.  IMPRESSION: Percutaneous nephrostomy with removal of left renal stone performed by Dr. Jeffie Pollock.   Electronically Signed   By: Rozetta Nunnery M.D.   On: 05/03/2013 13:18    Assessment/Plan: Clinically he seems to be doing well. No chest pain, shortness of breath or flank pain. He continues to void without difficulty and although his urine is still blood tinged it has improved overnight. His hemoglobin is down slightly. I do not feel there is any need for transfusion at this time. I do believe he needs to remain on the lowest dose of Lovenox and is clinically effective and that his coumadinization needs to be held for at least another 24 hours.  Continue Lovenox at reduced dose.  Hold coumadinization for another 24 hours.  Recheck H&H in a.m.   LOS: 10  days   Curvin Hunger C 05/05/2013, 8:01 AM

## 2013-05-05 NOTE — Progress Notes (Signed)
Patient ID: DAVARIUS RIDENER, male   DOB: 16-Jul-1929, 80 y.o.   MRN: [id number]   Pt doing well this afternoon.   NAD  Given that pt is stable, asymptomatic and that he may or may not be anticoagulated after the IVC filter, I will hold off on the CT. If the clot progresses and he needs anticoagulation in a week or two, the kidney would likely be healed.   If continued anticoagulation is considered I discussed with the patient's daughter significant risk of bleeding from the kidney possibly requiring IR embolization, blood transfusion, urinoma formation requiring perc drain if kidney obstructed by clots), clot evacuation or stent placement in OR. These risks are why we don't give anticoagulation after a PCNL. However if family and medical team decide to start anticoagulation it might be worthwhile to "stage" patient with CT to ensure he has not developed any of the above, but again not likely given his stability, only 1 g drop in hgb and only .1 increase in Cr. All questions answered. I also discussed patient with Dr. Charlies Silvers and Dr. Karsten Ro.

## 2013-05-05 NOTE — Progress Notes (Signed)
[facility] PROGRESS NOTE  [name] JQZ:[id number] DOB: 11-18-1929 DOA: 04/25/2013 PCP: [name], MD  Brief narrative: 80 year old male with past medical history of hypertension, BPH, history of prostate cancer and radiation treatment who presented to [facility] 04/25/2013 with right renal stone and LLP stone with microhematuria. Pt underwent left percutaneous nephrolithotomy on 05/03/2013. [facility] was consulted due to concern for his LLE swelling.  Doppler study revealed left lower extremity DVT.   Assessment and Plan:   Principal Problem:  Renal stone  - status post left percutaneous nephrolithotomy done 2/19  - management per GU  Active Problems:  DVT left lower extremity  - agree with reducing to lowest possible Lovenox dose due to blood tinged urine. In addition, we can hold coumadin - doppler US repeated and it seems that DVT propagated  to the proximal common femoral vein since the study of 05/03/2013. - asked IR to do IVC filter which is likely going to be done Sunday or Monday  Dyslipidemia  - continue statin therapy   Code Status: full code  Family Communication: family not at the bedside  Disposition Plan: home when stable; per primary team    Leisa Lenz, MD  [facility] Pager [phone]  If 7PM-7AM, please contact night-coverage [email or web address] Password [email or web address] 05/05/2013, 1:06 PM   LOS: 10 days    HPI/Subjective: No overnight events.   Objective: Filed Vitals:   05/04/13 2100 05/04/13 2124 05/05/13 0251 05/05/13 0456  BP:  125/74 105/72 119/76  Pulse:  96 91 87  Temp: 99.3 F (37.4 C) 98.4 F (36.9 C) 98.2 F (36.8 C) 97.9 F (36.6 C)  TempSrc: Oral Oral Oral Oral  Resp:  19 19 18   Height:      Weight:      SpO2:  90% 94% 94%    Intake/Output Summary (Last 24 hours) at 05/05/13 1306 Last data filed at 05/05/13 0902  Gross per 24 hour  Intake    600 ml  Output   1025 ml  Net   -425 ml    Exam:   General:  Pt is alert, follows  commands appropriately, not in acute distress  Cardiovascular: Regular rate and rhythm, S1/S2 appreciated   Respiratory: Clear to auscultation bilaterally, no wheezing, no crackles, no rhonchi  Abdomen: Soft, non tender, non distended, bowel sounds present, no guarding  Extremities: LLE swelling, pulses DP and PT palpable bilaterally  Neuro: Grossly nonfocal  Data Reviewed: Basic Metabolic Panel:  Recent Labs Lab 05/05/13 0353  NA 136*  K 4.7  CL 100  CO2 24  GLUCOSE 120*  BUN 25*  CREATININE 1.43*  CALCIUM 8.8   Liver Function Tests: No results found for this basename: AST, ALT, ALKPHOS, BILITOT, PROT, ALBUMIN,  in the last 168 hours No results found for this basename: LIPASE, AMYLASE,  in the last 168 hours No results found for this basename: AMMONIA,  in the last 168 hours CBC:  Recent Labs Lab 05/02/13 0438 05/04/13 0415 05/05/13 0353  WBC 8.5 10.0  --   HGB 12.1* 12.0* 11.2*  HCT 36.9* 37.2* 35.0*  MCV 85.2 85.3  --   PLT 140* 162  --    Cardiac Enzymes: No results found for this basename: CKTOTAL, CKMB, CKMBINDEX, TROPONINI,  in the last 168 hours BNP: No components found with this basename: POCBNP,  CBG: No results found for this basename: GLUCAP,  in the last 168 hours  No results found for this or any previous visit (from the  past 240 hour(s)).   Studies: No results found.  Scheduled Meds: . atorvastatin  40 mg Oral q1800  . docusate sodium  100 mg Oral BID  . doxazosin  8 mg Oral QHS  . enoxaparin (LOVENOX) injection  40 mg Subcutaneous Q24H  . fesoterodine  8 mg Oral Daily   Continuous Infusions:

## 2013-05-05 NOTE — Progress Notes (Signed)
VASCULAR LAB PRELIMINARY  PRELIMINARY  PRELIMINARY  PRELIMINARY  Left lower extremity venous duplex completed.    Preliminary report:  Positive for an acute left lower extremity DVT which has now propagated to the proximal common femoral vein since the study of 05/03/2013. Color flow and Doppler waveforms are not seen except for a minimal flow in the profunda vein from the tibial peroneal trunk to the proximal common femoral. Also noted is propagation of a superficial thrombus of the mid to proximal greater saphenous vein in the thigh. There is no evidence of propagation to the right lower extremity at this time.  Siara Gorder, RVS 05/05/2013, 9:41 AM

## 2013-05-05 NOTE — Progress Notes (Signed)
ANTICOAGULATION CONSULT NOTE - Follow Up Consult  Pharmacy Consult for Lovenox Indication: acute DVT  Allergies  Allergen Reactions  . Penicillins Other (See Comments)    HIVES     Patient Measurements: Height: 6\' 2"  (188 cm) Weight: 280 lb 13.9 oz (127.4 kg) IBW/kg (Calculated) : 82.2  Vital Signs: Temp: 97.9 F (36.6 C) (02/21 0456) Temp src: Oral (02/21 0456) BP: 119/76 mmHg (02/21 0456) Pulse Rate: 87 (02/21 0456)  Labs:  Recent Labs  05/04/13 0415 05/05/13 0353  HGB 12.0* 11.2*  HCT 37.2* 35.0*  PLT 162  --   LABPROT 15.6* 16.9*  INR 1.27 1.41  CREATININE  --  1.43*    Estimated Creatinine Clearance: 57.5 ml/min (by C-G formula based on Cr of 1.43).   Assessment: 62 yoM admitted 2/11 for left percutaneous nephrolithotomy. Stage 1 performed 2/12, stage 2 performed 2/19. Pt with PMH significant for AAA, aneurysm of R iliac artery, microscopic hematuria. Pt found on 2/19 to have LLE DVT and pharmacy has been consulted to dose enoxaparin and warfarin for VTE treatment. Patient on aspirin 325mg  PO daily which has not been continued inpatient.  Today's INR = 1.43 following 1 dose of coumadin of 10mg  on 2/19 Has received 2 doses of Lovenox 140mg  - most recent being at [facility] on 2/20   Warfarin and Lovenox held yesterday d/t significant hematuria. Per Dr. Karsten Ro last night, we are to hold warfarin and reduce Lovenox to "lowest possible dose."  Hgb 12.2, slightly decreased from baseline. Platelets = 162 yesterday. Noted patient with thrombocytopenia at baseline (plts~100 in past year per Epic records)  Goal of Therapy:  Monitor platelets by anticoagulation protocol: Yes   Plan:    Continue to hold warfarin until instructed to resume per Urologist  Reduce Lovenox to 40mg  sq q24h - have scheduled next dose to be at 6p tonight to give time for Urologist to assess if it should continue or consider changing to IV heparin as it's effects can be abated sooner than  Lovenox  Monitor CBC and hematuria closely   Peggyann Juba, PharmD, BCPS Pager: [phone] 05/05/2013,7:13 AM

## 2013-05-05 NOTE — Progress Notes (Signed)
Patient ID: [name], male   DOB: March 31, 1929, 80 y.o.   MRN: [id number] Request received for IVC filter placement in pt with history of prostate cancer 2008 , renal calculi, s/p recent left PCNL (x2) for residual stones ; now with persistent hematuria and newly diagnosed LLE DVT. Additional PMH as below. Exam: pt awake/alert; chest- sl dim BS bases; heart- RRR; abd- soft,+BS,NT; LLE edema noted, NT, sens/motor fxn ok.  Filed Vitals:   05/04/13 2100 05/04/13 2124 05/05/13 0251 05/05/13 0456  BP:  125/74 105/72 119/76  Pulse:  96 91 87  Temp: 99.3 F (37.4 C) 98.4 F (36.9 C) 98.2 F (36.8 C) 97.9 F (36.6 C)  TempSrc: Oral Oral Oral Oral  Resp:  19 19 18   Height:      Weight:      SpO2:  90% 94% 94%   Past Medical History  Diagnosis Date  . Hypertension   . BPH (benign prostatic hyperplasia)   . Hyperlipidemia   . Cancer 2008-2009    prostate TREATED WITH RADIATION  . AAA (abdominal aortic aneurysm) 04/2012    STENTING OF AAA IN [place]  . Junctional cardiac arrhythmia     Occurred postoperatively after urologic surgery  . Severe sinus bradycardia     Occurred postoperatively after urologic surgery   Past Surgical History  Procedure Laterality Date  . Abdominal surgery      PART OF COLON REMOVED FOR DIVERTICULITIS  . Appendectomy    . Hernia repair    . Bladder surgery  2008    FOR BLADDER STONE  . Total knee arthroplasty  1990    left  . Abdominal aortic aneurysm repair  04/2012  . Total knee arthroplasty Right 07/26/2012    Procedure: RIGHT TOTAL KNEE ARTHROPLASTY;  Surgeon: Tobi Bastos, MD;  Location: [facility];  Service: Orthopedics;  Laterality: Right;  . Nephrolithotomy Left 04/26/2013    Procedure: LEFT PERCUTANEOUS NEPHROLITHOTOMY ;  Surgeon: [name], MD;  Location: [facility];  Service: Urology;  Laterality: Left;  . Nephrolithotomy Left 05/03/2013    Procedure: 2ND STAGE LEFT PERCUTANEOUS NEPHROLITHOTOMY ;  Surgeon: [name], MD;  Location: [facility];   Service: Urology;  Laterality: Left;   Ct Abdomen Wo Contrast  04/27/2013   CLINICAL DATA:  Status post lithotripsy, evaluate for residual left renal stones  EXAM: CT ABDOMEN WITHOUT CONTRAST  TECHNIQUE: Multidetector CT imaging of the abdomen was performed following the standard protocol without IV contrast.  COMPARISON:  IR INTRO URET CATH PERC *L* dated 04/26/2013; DG ABDOMEN 1V dated 03/16/2013; CT CTA ABD/PEL W/CM AND/OR W/O CM dated 05/19/2012  FINDINGS: There is perinephric inflammatory change on the left, and there are multiple small foci of retroperitoneal left perinephric air. There is a percutaneous nephrostomy tube in addition to a percutaneously placed and partially visualized left ureteral stent. There is mild dilatation of the left renal pelvis. There is a 4.5 cm left renal cyst.  There are a few tiny stone fragments in the mildly dilated renal pelvis. There are approximately 3 tiny fragments the largest measuring 2 mm. In the lower pole collecting system, there are for remaining calculi. These measure 4 mm, 6 mm, 4 mm, and 8 mm respectively.  There is coronary arterial calcification. There are small bilateral pleural effusions and bilateral lower lobe consolidation deep dependently. Aspiration is not excluded, although dependent atelectasis may account for the findings. There is a 6 cm abdominal aortic aneurysm at the level of the diaphragm.  There is stent bypass repair involving the distal abdominal aorta. No significant change in aortic aneurysm when compared to 2014. Liver, gallbladder, right kidney, pancreas, spleen, and adrenal glands show no acute findings.  IMPRESSION: Multiple residual left renal calculi as described above. Other findings involving the left kidney consistent with post-procedure appearance.  Bilateral lower lobe consolidation.   Electronically Signed   By: Skipper Cliche M.D.   On: 04/27/2013 09:52   Dg Abd 1 View  05/03/2013   CLINICAL DATA:  Left renal stone.  EXAM:  ABDOMEN - 1 VIEW; DG C-ARM 1-60 MIN - NRPT [facility]  COMPARISON:  CT scan dated 04/27/2013  FLUOROSCOPY TIME:  0 min 54 seconds  FINDINGS: Multiple C-arm images demonstrate the percutaneous nephrostomy as well as a nephrostogram. According to the operative note the stone was fragmented and extracted. Small filling defect in the distal ureter on image number 5 could represent an air bubble or a small stone fragment.  IMPRESSION: Percutaneous nephrostomy with removal of left renal stone performed by Dr. Jeffie Pollock.   Electronically Signed   By: Rozetta Nunnery M.D.   On: 05/03/2013 13:18   Dg C-arm 1-60 Min-no Report  05/03/2013   CLINICAL DATA:  Left renal stone.  EXAM: ABDOMEN - 1 VIEW; DG C-ARM 1-60 MIN - NRPT [facility]  COMPARISON:  CT scan dated 04/27/2013  FLUOROSCOPY TIME:  0 min 54 seconds  FINDINGS: Multiple C-arm images demonstrate the percutaneous nephrostomy as well as a nephrostogram. According to the operative note the stone was fragmented and extracted. Small filling defect in the distal ureter on image number 5 could represent an air bubble or a small stone fragment.  IMPRESSION: Percutaneous nephrostomy with removal of left renal stone performed by Dr. Jeffie Pollock.   Electronically Signed   By: Rozetta Nunnery M.D.   On: 05/03/2013 13:18   Dg C-arm 1-60 Min-no Report  04/26/2013   CLINICAL DATA: intra op   C-ARM 1-60 MINUTES  Fluoroscopy was utilized by the requesting physician.  No radiographic  interpretation.    Ir Oris Drone Cath Perc Left  04/26/2013   CLINICAL DATA:  Left lower pole calculus , pre percutaneous intraoperative nephrolithotomy  EXAM: LEFT PERCUTANEOUS NEPHRO-URETERAL CATHETER PLACEMENT UNDER FLUOROSCOPIC GUIDANCE  TECHNIQUE: The procedure, risks (including but not limited to bleeding, infection, organ damage ), benefits, and alternatives were explained to the patient. Questions regarding the procedure were encouraged and answered. The patient understands and consents to the procedure. The leftflank  region prepped with Betadine, draped in usual sterile fashion, infiltrated locally with 1% lidocaine.As antibiotic prophylaxis, ciprofloxacin 400 mg was ordered pre-procedure and administered intravenously within one hour of incision.  Intravenous Fentanyl and Versed were administered as conscious sedation during continuous cardiorespiratory monitoring by the radiology RN, with a total moderate sedation time of 20 minutes.  Under real-time ultrasound guidance, a 21-gauge trocar needle was advanced into a posterior lower pole calyx using the calculus as a guide. A 018 inch guidewire was advanced into the proximal ureter. Needle exchanged for 3 French dilator allowing contrast injection, confirming appropriate positioning. The dilator was exchanged over the 018 wire for the transitional dilator, which allowed passage of the 035 Bentson wire down the ureter. Over this, a 5 [place] angiographic catheter was advanced into the urinary bladder. Catheter was capped externally and secured to the skin with an 0 silk suture. The patient tolerated the procedure well. No immediate complication.  FLUOROSCOPY TIME:  5 MIN 0 seconds  IMPRESSION: 1. Technically successful  left percutaneous nephro-ureteral catheter placement for planned intraoperative procedure.   Electronically Signed   By: Arne Cleveland M.D.   On: 04/26/2013 15:17  Results for orders placed during the hospital encounter of 04/25/13  APTT      Result Value Ref Range   aPTT 29  24 - 37 seconds  BASIC METABOLIC PANEL      Result Value Ref Range   Sodium 140  137 - 147 mEq/L   Potassium 4.8  3.7 - 5.3 mEq/L   Chloride 103  96 - 112 mEq/L   CO2 28  19 - 32 mEq/L   Glucose, Bld 89  70 - 99 mg/dL   BUN 20  6 - 23 mg/dL   Creatinine, Ser 1.21  0.50 - 1.35 mg/dL   Calcium 9.5  8.4 - 10.5 mg/dL   GFR calc non Af Amer 54 (*) >90 mL/min   GFR calc Af Amer 63 (*) >90 mL/min  CBC      Result Value Ref Range   WBC 5.9  4.0 - 10.5 K/uL   RBC 5.02  4.22 - 5.81  MIL/uL   Hemoglobin 13.7  13.0 - 17.0 g/dL   HCT 42.9  39.0 - 52.0 %   MCV 85.5  78.0 - 100.0 fL   MCH 27.3  26.0 - 34.0 pg   MCHC 31.9  30.0 - 36.0 g/dL   RDW 14.6  11.5 - 15.5 %   Platelets 126 (*) 150 - 400 K/uL  PROTIME-INR      Result Value Ref Range   Prothrombin Time 13.5  11.6 - 15.2 seconds   INR 1.05  0.00 - 1.49  HEMOGLOBIN AND HEMATOCRIT, BLOOD      Result Value Ref Range   Hemoglobin 13.8  13.0 - 17.0 g/dL   HCT 42.0  39.0 - 52.0 %  HEMOGLOBIN AND HEMATOCRIT, BLOOD      Result Value Ref Range   Hemoglobin 13.7  13.0 - 17.0 g/dL   HCT 41.5  39.0 - 52.0 %  TOBRAMYCIN LEVEL, RANDOM      Result Value Ref Range   Tobramycin Rm 6.9    BASIC METABOLIC PANEL      Result Value Ref Range   Sodium 135 (*) 137 - 147 mEq/L   Potassium 4.6  3.7 - 5.3 mEq/L   Chloride 99  96 - 112 mEq/L   CO2 25  19 - 32 mEq/L   Glucose, Bld 153 (*) 70 - 99 mg/dL   BUN 21  6 - 23 mg/dL   Creatinine, Ser 1.29  0.50 - 1.35 mg/dL   Calcium 8.8  8.4 - 10.5 mg/dL   GFR calc non Af Amer 50 (*) >90 mL/min   GFR calc Af Amer 58 (*) >90 mL/min  CBC      Result Value Ref Range   WBC 12.9 (*) 4.0 - 10.5 K/uL   RBC 4.58  4.22 - 5.81 MIL/uL   Hemoglobin 13.1  13.0 - 17.0 g/dL   HCT 39.1  39.0 - 52.0 %   MCV 85.4  78.0 - 100.0 fL   MCH 28.6  26.0 - 34.0 pg   MCHC 33.5  30.0 - 36.0 g/dL   RDW 15.0  11.5 - 15.5 %   Platelets 101 (*) 150 - 400 K/uL  CBC      Result Value Ref Range   WBC 8.5  4.0 - 10.5 K/uL   RBC 4.33  4.22 - 5.81 MIL/uL  Hemoglobin 12.1 (*) 13.0 - 17.0 g/dL   HCT 36.9 (*) 39.0 - 52.0 %   MCV 85.2  78.0 - 100.0 fL   MCH 27.9  26.0 - 34.0 pg   MCHC 32.8  30.0 - 36.0 g/dL   RDW 14.6  11.5 - 15.5 %   Platelets 140 (*) 150 - 400 K/uL  PROTIME-INR      Result Value Ref Range   Prothrombin Time 15.6 (*) 11.6 - 15.2 seconds   INR 1.27  0.00 - 1.49  CBC      Result Value Ref Range   WBC 10.0  4.0 - 10.5 K/uL   RBC 4.36  4.22 - 5.81 MIL/uL   Hemoglobin 12.0 (*) 13.0 - 17.0 g/dL    HCT 37.2 (*) 39.0 - 52.0 %   MCV 85.3  78.0 - 100.0 fL   MCH 27.5  26.0 - 34.0 pg   MCHC 32.3  30.0 - 36.0 g/dL   RDW 14.7  11.5 - 15.5 %   Platelets 162  150 - 400 K/uL  HEMOGLOBIN AND HEMATOCRIT, BLOOD      Result Value Ref Range   Hemoglobin 11.2 (*) 13.0 - 17.0 g/dL   HCT 35.0 (*) 39.0 - 52.0 %  PROTIME-INR      Result Value Ref Range   Prothrombin Time 16.9 (*) 11.6 - 15.2 seconds   INR 1.41  0.00 - [id number]  BASIC METABOLIC PANEL      Result Value Ref Range   Sodium 136 (*) 137 - 147 mEq/L   Potassium 4.7  3.7 - 5.3 mEq/L   Chloride 100  96 - 112 mEq/L   CO2 24  19 - 32 mEq/L   Glucose, Bld 120 (*) 70 - 99 mg/dL   BUN 25 (*) 6 - 23 mg/dL   Creatinine, Ser 1.43 (*) 0.50 - 1.35 mg/dL   Calcium 8.8  8.4 - 10.5 mg/dL   GFR calc non Af Amer 44 (*) >90 mL/min   GFR calc Af Amer 51 (*) >90 mL/min  TYPE AND SCREEN      Result Value Ref Range   ABO/RH(D) O POS     Antibody Screen NEG     Sample Expiration 04/28/2013    TYPE AND SCREEN      Result Value Ref Range   ABO/RH(D) O POS     Antibody Screen NEG     Sample Expiration 05/05/2013     A/P:  Pt with history of prostate cancer 2008 , renal calculi, s/p recent left PCNL (x2) for residual stones ; now with persistent hematuria and newly diagnosed LLE DVT. Plan is for IVC filter placement on 2/22. Details/risks of procedure d/w pt with his understanding and consent.

## 2013-05-05 NOTE — Progress Notes (Signed)
OT Cancellation Note  Patient Details Name: [name] MRN: [id number] DOB: Aug 08, 1929   Cancelled Treatment:    Reason Eval/Treat Not Completed: Medical issues which prohibited therapy (pt with acute DVT, anticoagulation on hold until this evening due to hematuria)  Andreas Sobolewski A 05/05/2013, 10:48 AM

## 2013-05-06 ENCOUNTER — Inpatient Hospital Stay (HOSPITAL_COMMUNITY): Payer: Medicare Other

## 2013-05-06 DIAGNOSIS — R310 Gross hematuria: Secondary | ICD-10-CM | POA: Diagnosis not present

## 2013-05-06 LAB — APTT: APTT: 43 s — AB (ref 24–37)

## 2013-05-06 LAB — BASIC METABOLIC PANEL
BUN: 26 mg/dL — ABNORMAL HIGH (ref 6–23)
CHLORIDE: 102 meq/L (ref 96–112)
CO2: 28 mEq/L (ref 19–32)
CREATININE: 1.35 mg/dL (ref 0.50–1.35)
Calcium: 8.9 mg/dL (ref 8.4–10.5)
GFR, EST AFRICAN AMERICAN: 55 mL/min — AB (ref >90)
GFR, EST NON AFRICAN AMERICAN: 48 mL/min — AB (ref >90)
Glucose, Bld: 89 mg/dL (ref 70–99)
POTASSIUM: 4.6 meq/L (ref 3.7–5.3)
Sodium: 139 mEq/L (ref 137–147)

## 2013-05-06 LAB — HEMOGLOBIN AND HEMATOCRIT, BLOOD
HCT: 32.9 % — ABNORMAL LOW (ref 39.0–52.0)
Hemoglobin: 10.6 g/dL — ABNORMAL LOW (ref 13.0–17.0)

## 2013-05-06 LAB — PROTIME-INR
INR: 1.3 (ref 0.00–1.49)
PROTHROMBIN TIME: 15.9 s — AB (ref 11.6–15.2)

## 2013-05-06 MED ORDER — IOHEXOL 300 MG/ML  SOLN
INTRAMUSCULAR | Status: AC | PRN
  Administered 2013-05-06: 1 mL

## 2013-05-06 MED ORDER — MIDAZOLAM HCL 2 MG/2ML IJ SOLN
INTRAMUSCULAR | Status: AC
  Filled 2013-05-06: qty 6

## 2013-05-06 MED ORDER — MIDAZOLAM HCL 2 MG/2ML IJ SOLN
INTRAMUSCULAR | Status: AC | PRN
  Administered 2013-05-06: 1 mg via INTRAVENOUS

## 2013-05-06 MED ORDER — FENTANYL CITRATE 0.05 MG/ML IJ SOLN
INTRAMUSCULAR | Status: AC | PRN
  Administered 2013-05-06: 25 ug via INTRAVENOUS

## 2013-05-06 MED ORDER — FENTANYL CITRATE 0.05 MG/ML IJ SOLN
INTRAMUSCULAR | Status: AC
  Filled 2013-05-06: qty 6

## 2013-05-06 MED ORDER — LIDOCAINE HCL 1 % IJ SOLN
INTRAMUSCULAR | Status: AC
  Filled 2013-05-06: qty 20

## 2013-05-06 NOTE — Progress Notes (Addendum)
[facility] PROGRESS NOTE  [name] HDQ:[id number] DOB: [birth year] DOA: 04/25/2013 PCP: [name], MD  Brief narrative: 80 year old male with past medical history of hypertension, BPH, history of prostate cancer and radiation treatment who presented to [facility] 04/25/2013 with right renal stone and LLP stone with microhematuria. Pt underwent left percutaneous nephrolithotomy on 05/03/2013. [facility] was consulted due to concern for his LLE swelling.  Doppler study revealed left lower extremity DVT. Doppler study repeated with some propagation of DVT so order placed for IVC filter.  Assessment and Plan:   Principal Problem:  Renal stone  - status post left percutaneous nephrolithotomy done 2/19  - management per GU  Active Problems:  DVT left lower extremity  - IVC filter placement today - hope he will be off AC once IVC filter placed  Dyslipidemia  - continue statin therapy   Code Status: full code  Family Communication: family not at the bedside this am Disposition Plan: home when stable; per primary team    Leisa Lenz, MD  [facility] Pager [phone]  If 7PM-7AM, please contact night-coverage [email or web address] Password [email or web address] 05/06/2013, 11:01 AM   LOS: 11 days    HPI/Subjective: No acute overnight events.   Objective: Filed Vitals:   05/06/13 0854 05/06/13 0859 05/06/13 0904 05/06/13 0923  BP: 114/75 107/75 107/73 109/66  Pulse: 84 87 86 85  Temp:      TempSrc:      Resp: 21 20 19 20   Height:      Weight:      SpO2: 98% 95% 95% 95%    Intake/Output Summary (Last 24 hours) at 05/06/13 1101 Last data filed at 05/06/13 0900  Gross per 24 hour  Intake    240 ml  Output    950 ml  Net   -710 ml    Exam:  General: Pt is alert, follows commands appropriately, not in acute distress  Cardiovascular: Regular rate and rhythm, S1/S2 appreciated  Respiratory: Clear to auscultation bilaterally, no wheezing, no crackles, no rhonchi  Abdomen: Soft, non  tender, non distended, bowel sounds present, no guarding  Extremities: LLE swelling, pulses DP and PT palpable bilaterally  Neuro: Grossly nonfocal   Data Reviewed: Basic Metabolic Panel:  Recent Labs Lab 05/05/13 0353 05/06/13 0437  NA 136* 139  K 4.7 4.6  CL 100 102  CO2 24 28  GLUCOSE 120* 89  BUN 25* 26*  CREATININE 1.43* 1.35  CALCIUM 8.8 8.9   Liver Function Tests: No results found for this basename: AST, ALT, ALKPHOS, BILITOT, PROT, ALBUMIN,  in the last 168 hours No results found for this basename: LIPASE, AMYLASE,  in the last 168 hours No results found for this basename: AMMONIA,  in the last 168 hours CBC:  Recent Labs Lab 05/02/13 0438 05/04/13 0415 05/05/13 0353 05/06/13 0437  WBC 8.5 10.0  --   --   HGB 12.1* 12.0* 11.2* 10.6*  HCT 36.9* 37.2* 35.0* 32.9*  MCV 85.2 85.3  --   --   PLT 140* 162  --   --    Cardiac Enzymes: No results found for this basename: CKTOTAL, CKMB, CKMBINDEX, TROPONINI,  in the last 168 hours BNP: No components found with this basename: POCBNP,  CBG: No results found for this basename: GLUCAP,  in the last 168 hours  No results found for this or any previous visit (from the past 240 hour(s)).   Studies: No results found.  Scheduled Meds: . atorvastatin  40 mg Oral  q1800  . docusate sodium  100 mg Oral BID  . doxazosin  8 mg Oral QHS  . fesoterodine  8 mg Oral Daily  . lidocaine       Continuous Infusions:

## 2013-05-06 NOTE — Progress Notes (Signed)
PT Cancellation Note  ___Treatment cancelled today due to medical issues with patient which prohibited therapy  _X_ Treatment cancelled today due to patient receiving procedure or test .......... IVC filter placement today  ___ Treatment cancelled today due to patient's refusal to participate   ___ Treatment cancelled today due to   Rica Koyanagi  PTA [facility]  Acute  Rehab Pager      [phone]

## 2013-05-06 NOTE — Progress Notes (Signed)
Patient back from interventional radiology post IVC filter placement, patient alert and oriented, denies any pain or distress, in bed resting, IVC filter placement site right neck, no sign of bleeding/edema noted (clean/dry/intact). Family at the bedside, will continue to assess patient.

## 2013-05-06 NOTE — Progress Notes (Signed)
OT Cancellation Note  Patient Details Name: [name] MRN: [id number] DOB: Apr 21, 1929   Cancelled Treatment:    Reason Eval/Treat Not Completed: Medical issues which prohibited therapy (pt to have IVC filter placed today)  Jaye Saal A 05/06/2013, 8:23 AM

## 2013-05-06 NOTE — Progress Notes (Signed)
3 Days Post-Op Subjective: Patient reports no further flank pain. He also reports no SOB or chest pain. By his report his urine has continued to clear and is free of clots. He is voiding spontaneously.  Objective: Vital signs in last 24 hours: Temp:  [97.9 F (36.6 C)-99 F (37.2 C)] 98.4 F (36.9 C) (02/22 0646) Pulse Rate:  [80-89] 80 (02/22 0646) Resp:  [18-24] 24 (02/22 0646) BP: (118-128)/(74-76) 128/74 mmHg (02/22 0646) SpO2:  [97 %-98 %] 98 % (02/22 0646)  Intake/Output from previous day: 02/21 0701 - 02/22 0700 In: 360 [P.O.:360] Out: 1200 [Urine:1200] Intake/Output this shift:     Lab Results:  Recent Labs  05/04/13 0415 05/05/13 0353 05/06/13 0437  HGB 12.0* 11.2* 10.6*  HCT 37.2* 35.0* 32.9*   BMET  Recent Labs  05/05/13 0353 05/06/13 0437  NA 136* 139  K 4.7 4.6  CL 100 102  CO2 24 28  GLUCOSE 120* 89  BUN 25* 26*  CREATININE 1.43* 1.35  CALCIUM 8.8 8.9    Recent Labs  05/04/13 0415 05/05/13 0353 05/06/13 0437  INR 1.27 1.41 1.30   No results found for this basename: LABURIN,  in the last 72 hours Results for orders placed during the hospital encounter of 07/18/12  SURGICAL PCR SCREEN     Status: None   Collection Time    07/18/12  1:05 PM      Result Value Ref Range Status   MRSA, PCR NEGATIVE  NEGATIVE Final   Staphylococcus aureus NEGATIVE  NEGATIVE Final   Comment:            The Xpert SA Assay (FDA     approved for NASAL specimens     in patients over 62 years of age),     is one component of     a comprehensive surveillance     program.  Test performance has     been validated by [employer] for patients greater     than or equal to 87 year old.     It is not intended     to diagnose infection nor to     guide or monitor treatment.    Studies/Results: No results found.  Assessment/Plan: He appears to be doing well with reported clearing of his urine by report. He is voiding spontaneously and his urine was not  visualized personally. With no further flank pain I agree that a CT scan is currently not necessary. I do however agree with Dr. Lyndal Rainbow recommendations. His hemoglobin has decreased slightly but I do not feel he is in need of transfusion at this time. His creatinine has improved over yesterday. IVC filter placement today will allow him to be taken off his Lovenox with likely clearance of his bleeding and then eventual anticoagulation if necessary.  For IVC filter today.   LOS: 11 days   Kali Deadwyler C 05/06/2013, 7:05 AM

## 2013-05-06 NOTE — Procedures (Signed)
IVC filter  

## 2013-05-07 LAB — HEPARIN LEVEL (UNFRACTIONATED): Heparin Unfractionated: 0.34 IU/mL (ref 0.30–0.70)

## 2013-05-07 MED ORDER — HEPARIN (PORCINE) IN NACL 100-0.45 UNIT/ML-% IJ SOLN
1750.0000 [IU]/h | INTRAMUSCULAR | Status: DC
Start: 2013-05-07 — End: 2013-05-09
  Administered 2013-05-07 – 2013-05-08 (×3): 1750 [IU]/h via INTRAVENOUS
  Filled 2013-05-07 (×5): qty 250

## 2013-05-07 NOTE — Progress Notes (Addendum)
SNF bed offers reviewed with pt daughter. Pt family chooses bed at [facility]. CSW left message with [facility] admission coordinator. CSW to continue to follow and facilitate pt discharge needs when pt medically ready for discharge.    Clinical Social Work Department CLINICAL SOCIAL WORK PLACEMENT NOTE 05/07/2013  Patient:  Elijah, [name]  Account Number:  [id number] Admit date:  04/25/2013  Clinical Social Worker:  Renold Genta  Date/time:  05/07/2013 03:25 PM  Clinical Social Work is seeking post-discharge placement for this patient at the following level of care:   SKILLED NURSING   (*CSW will update this form in Epic as items are completed)   05/07/2013  Patient/family provided with [facility] Department of Clinical Social Work's list of facilities offering this level of care within the geographic area requested by the patient (or if unable, by the patient's family).  05/07/2013  Patient/family informed of their freedom to choose among providers that offer the needed level of care, that participate in Medicare, Medicaid or managed care program needed by the patient, have an available bed and are willing to accept the patient.  05/07/2013  Patient/family informed of [facility]' ownership interest in [facility], as well as of the fact that they are under no obligation to receive care at this facility.  PASARR submitted to EDS on 05/07/2013 PASARR number received from EDS on 05/07/2013  FL2 transmitted to all facilities in geographic area requested by pt/family on  05/07/2013 FL2 transmitted to all facilities within larger geographic area on   Patient informed that his/her managed care company has contracts with or will negotiate with  certain facilities, including the following:     Patient/family informed of bed offers received:  05/08/2013 Patient chooses bed at [facility] Physician recommends and patient chooses bed at    Patient to be transferred  to  on   Patient to be transferred to facility by   The following physician request were entered in Epic:   Additional Comments:   [name], [place] Worker cell #: [phone]  [name], MSW, LCSW Clinical Social Work Coverage for [place], [place]

## 2013-05-07 NOTE — Progress Notes (Signed)
Patient ID: [name], male   DOB: [birth year], 80 y.o.   MRN: [id number] 4 Days Post-Op  Subjective: [name] is 4 days post op 2nd look PCNL and 11 days post op 1st look PCNL.  His course has been complicated by an acute LLE DVT.  He was originally managed with SCD's, but had some post op bleeding and was slow to ambulate.   He was initially managed for the DVT with Lovenox that was started Thursday.  He developed hematuria and clot colic over the weekend and had placement of a Greenfield filter on 05/06/13.  The hematuria has largely cleared.   He is voiding well without incontinence.  He has had a BM.  He denies pain in the LLE.  He remains on bedrest. ROS: Negative except as above.    Objective: Vital signs in last 24 hours: Temp:  [98.8 F (37.1 C)-99.2 F (37.3 C)] 99.2 F (37.3 C) (02/22 2010) Pulse Rate:  [77-93] 93 (02/22 2010) Resp:  [19-22] 20 (02/22 2010) BP: (107-136)/(66-81) 135/72 mmHg (02/22 2010) SpO2:  [95 %-99 %] 99 % (02/22 2010)  Intake/Output from previous day: 02/22 0701 - 02/23 0700 In: 360 [P.O.:360] Out: 1225 [Urine:1225] Intake/Output this shift:    General appearance: alert and no distress Resp: clear to auscultation bilaterally Cardio: regular rate and rhythm GI: soft, non-tender; bowel sounds normal; no masses,  no organomegaly Extremities: He has persistent edema of the LLE to above the knee but has no erythema or tenderness.  The right calf has SCD in place and is without edema.  Nephrostomy dressing is intact.   Lab Results:   Recent Labs  05/05/13 0353 05/06/13 0437  HGB 11.2* 10.6*  HCT 35.0* 32.9*   BMET  Recent Labs  05/05/13 0353 05/06/13 0437  NA 136* 139  K 4.7 4.6  CL 100 102  CO2 24 28  GLUCOSE 120* 89  BUN 25* 26*  CREATININE 1.43* 1.35  CALCIUM 8.8 8.9   PT/INR  Recent Labs  05/05/13 0353 05/06/13 0437  LABPROT 16.9* 15.9*  INR 1.41 1.30   ABG No results found for this basename: PHART, PCO2, PO2, HCO3,   in the last 72 hours  Studies/Results: Ir Ivc Filter Plmt / S&i /img Guid/mod Sed  05/06/2013   CLINICAL DATA:  DVT.  Hematuria.  EXAM: IVC FILTER,INFERIOR VENA CAVOGRAM  MEDICATIONS AND MEDICAL HISTORY: Versed 1 mg, Fentanyl 25 mcg.  Additional Medications: None.  ANESTHESIA/SEDATION: Moderate sedation time: 10 minutes  CONTRAST:  40 cc Omnipaque 300  FLUOROSCOPY TIME:  1.0 min  PROCEDURE: The procedure, risks, benefits, and alternatives were explained to the patient. Questions regarding the procedure were encouraged and answered. The patient understands and consents to the procedure.  The right neck was prepped with Betadine in a sterile fashion, and a sterile drape was applied covering the operative field. A sterile gown and sterile gloves were used for the procedure.  The right internal jugular vein was noted to be patent initially with ultrasound. Under sonographic guidance, a micropuncture needle was inserted into the right internal jugular vein (Ultrasound image documentation was performed). It was removed over an 018 wire which was upsized to a [place]. The sheath was inserted over the wire and into the IVC. IVC venography was performed.  The temporary Denali filter was then deployed in the infrarenal IVC. The sheath was removed and hemostasis was achieved with direct pressure.  COMPLICATIONS: None  FINDINGS: IVC venography confirms renal vein inflow at L1  and no venous anomaly.  The image demonstrates placement of an IVC filter with its tip at the L1-2 disc.  IMPRESSION: Successful infrarenal IVC filter placement. This is a temporary filter. It can be removed or remain in place to become permanent.   Electronically Signed   By: Maryclare Bean M.D.   On: 05/06/2013 12:17    Anti-infectives: Anti-infectives   Start     Dose/Rate Route Frequency Ordered Stop   05/03/13 0600  cefTRIAXone (ROCEPHIN) 2 g in dextrose 5 % 50 mL IVPB     2 g 100 mL/hr over 30 Minutes Intravenous 30 min pre-op 05/02/13 0902  05/03/13 1112   04/28/13 1200  cefTRIAXone (ROCEPHIN) 1 g in dextrose 5 % 50 mL IVPB  Status:  Discontinued     1 g 100 mL/hr over 30 Minutes Intravenous Every 24 hours 04/27/13 1040 05/04/13 0731   04/26/13 1800  tobramycin (NEBCIN) 700 mg in dextrose 5 % 100 mL IVPB  Status:  Discontinued     700 mg 117.5 mL/hr over 60 Minutes Intravenous Every 24 hours 04/26/13 1731 04/27/13 0710   04/26/13 0853  ciprofloxacin (CIPRO) 400 MG/200ML IVPB    Comments:  Margaretmary Dys   : cabinet override      04/26/13 0853 04/26/13 0943   04/26/13 0730  ciprofloxacin (CIPRO) IVPB 400 mg     400 mg 200 mL/hr over 60 Minutes Intravenous On call 04/25/13 1423 04/26/13 1029   04/25/13 1730  [MAR Hold]  tobramycin (NEBCIN) 700 mg in dextrose 5 % 100 mL IVPB  Status:  Discontinued     (On MAR Hold since 04/26/13 1014)   700 mg 117.5 mL/hr over 60 Minutes Intravenous Every 24 hours 04/25/13 1637 04/26/13 1637   04/25/13 1415  tobramycin (NEBCIN) 60 mg in dextrose 5 % 50 mL IVPB  Status:  Discontinued    Comments:  PER PHARMACY DOSING   60 mg 103 mL/hr over 30 Minutes Intravenous  Once 04/25/13 1409 04/25/13 1419      Current Facility-Administered Medications  Medication Dose Route Frequency Provider Last Rate Last Dose  . acetaminophen (TYLENOL) tablet 650 mg  650 mg Oral Q4H PRN [name], MD   650 mg at 04/28/13 2127  . atorvastatin (LIPITOR) tablet 40 mg  40 mg Oral q1800 [name], MD   40 mg at 05/06/13 1725  . bisacodyl (DULCOLAX) suppository 10 mg  10 mg Rectal Daily PRN [name], MD   10 mg at 05/04/13 1402  . diphenhydrAMINE (BENADRYL) injection 12.5 mg  12.5 mg Intravenous Q6H PRN [name], MD       Or  . diphenhydrAMINE (BENADRYL) 12.5 MG/5ML elixir 12.5 mg  12.5 mg Oral Q6H PRN [name], MD   12.5 mg at 04/30/13 1828  . docusate sodium (COLACE) capsule 100 mg  100 mg Oral BID [name], MD   100 mg at 05/06/13 2122  . doxazosin (CARDURA) tablet 8 mg  8 mg Oral QHS [name], MD   8 mg at  05/06/13 2122  . fesoterodine (TOVIAZ) tablet 8 mg  8 mg Oral Daily [name], MD   8 mg at 05/06/13 7124  . HYDROmorphone (DILAUDID) injection 0.5-1 mg  0.5-1 mg Intravenous Q2H PRN [name], MD   1 mg at 05/04/13 2100  . ondansetron (ZOFRAN) injection 4 mg  4 mg Intravenous Q4H PRN [name], MD   4 mg at 05/04/13 2100  . oxyCODONE-acetaminophen (PERCOCET/ROXICET) 5-325 MG per tablet 1-2  tablet  1-2 tablet Oral Q4H PRN [name], MD   2 tablet at 05/06/13 2122  . traMADol (ULTRAM) tablet 50 mg  50 mg Oral QID PRN [name], MD   50 mg at 05/04/13 1943    Assessment: s/p Procedure(s): 2ND STAGE LEFT PERCUTANEOUS NEPHROLITHOTOMY   He is doing well post Greenfield filter placement and the hematuria has declined.  Plan: He resume ambulation if ok with the hospitalist service. He will need to stay off of lovenox for another 24 hrs to insure that his hematuria doesn't return.    LOS: 12 days    Dang Mathison J 05/07/2013

## 2013-05-07 NOTE — Progress Notes (Signed)
ANTICOAGULATION CONSULT NOTE - Initial Consult  Pharmacy Consult for IV Heparin Indication: acute DVT  Allergies  Allergen Reactions  . Penicillins Other (See Comments)    HIVES     Patient Measurements: Height: 6\' 2"  (188 cm) Weight: 280 lb 13.9 oz (127.4 kg) IBW/kg (Calculated) : 82.2 Heparin weight: 110 kg  Vital Signs: Temp: 98.4 F (36.9 C) (02/23 0610) Temp src: Oral (02/23 0610) BP: 137/82 mmHg (02/23 0610) Pulse Rate: 84 (02/23 0610)  Labs:  Recent Labs  05/05/13 0353 05/06/13 0437  HGB 11.2* 10.6*  HCT 35.0* 32.9*  APTT  --  43*  LABPROT 16.9* 15.9*  INR 1.41 1.30  CREATININE 1.43* 1.35    Estimated Creatinine Clearance: 60.9 ml/min (by C-G formula based on Cr of 1.35).   Assessment: 52 yoM admitted 2/11 for left percutaneous nephrolithotomy. Stage 1 performed 2/12, stage 2 performed 2/19. Pt with PMH significant for AAA, aneurysm of R iliac artery, microscopic hematuria. Pt found on 2/19 to have LLE DVT and pharmacy consulted to dose enoxaparin and warfarin for VTE treatment. Has since been placed on hold due to hematuria.  IVC filter placed 2/22.  Dr. Marin Olp has been consulted and recommending resuming anticoagulation with IV heparin 2/23.  Anticoagulation labs slightly elevated due to recent Lovenox/Warfarin administration.  INR 1.30, APTT 43. Hgb 10.6, decreasing. Platelets = 162 (2/20).  Monitoring CBC closely. SCr 1.35, CrCl~60 ml/min  Goal of Therapy:  Heparin level goal 0.3-0.7 Monitor platelets by anticoagulation protocol: Yes   Plan:  1.  Start IV heparin at 1750 units/hr.  No bolus. 2.  Check heparin level 8 hours following start. 3.  Daily HL/CBC.  Hershal Coria, PharmD, BCPS Pager: [phone] 05/07/2013 1:57 PM

## 2013-05-07 NOTE — Progress Notes (Addendum)
[facility] PROGRESS NOTE  [name] BZJ:[id number] DOB: 1930/01/09 DOA: 04/25/2013 PCP: [name], MD  Brief narrative: 80 year old male with past medical history of hypertension, BPH, history of prostate cancer and radiation treatment who presented to [facility] 04/25/2013 with right renal stone and LLP stone with microhematuria. Pt underwent left percutaneous nephrolithotomy on 05/03/2013. [facility] was consulted due to concern for his LLE swelling.  Doppler study revealed left lower extremity DVT. Doppler study repeated with some propagation of DVT so order placed for IVC filter.   Assessment and Plan:   Principal Problem:  Renal stone  - status post left percutaneous nephrolithotomy done 2/19  - management per GU   Active Problems:  DVT left lower extremity  - IVC filter placed 05/06/2013  - I spoke with Dr. Marin Olp if pt should have AC. He said it would be beneficial to have [facility] and we should start it today. AC with heparin drip. Order placed. - Dr. Marin Olp graciously agreed to see the pt  Dyslipidemia  - Continue statin therapy   Code Status: full code  Family Communication: pt may require SNF placement on discharge so will ask SW assistance in discharge plan   Leisa Lenz, MD  [facility] Pager [phone]  If 7PM-7AM, please contact night-coverage [email or web address] Password [email or web address] 05/07/2013, 12:43 PM   LOS: 12 days   HPI/Subjective: No overnight events.   Objective: Filed Vitals:   05/06/13 0923 05/06/13 1502 05/06/13 2010 05/07/13 0610  BP: 109/66 136/80 135/72 137/82  Pulse: 85 77 93 84  Temp:  98.8 F (37.1 C) 99.2 F (37.3 C) 98.4 F (36.9 C)  TempSrc:  Oral Oral Oral  Resp: 20 20 20 18   Height:      Weight:      SpO2: 95% 97% 99% 96%    Intake/Output Summary (Last 24 hours) at 05/07/13 1243 Last data filed at 05/07/13 0900  Gross per 24 hour  Intake    600 ml  Output   1245 ml  Net   -645 ml    Exam:   General:  Pt is not in acute  distress  Cardiovascular: Regular rate and rhythm, S1/S2, no murmurs, no rubs, no gallops  Respiratory: Clear to auscultation bilaterally, no wheezing, no crackles, no rhonchi  Abdomen: Soft, non tender, non distended, bowel sounds present, no guarding  Extremities: LLE swelling, pulses DP and PT palpable bilaterally  Neuro: Grossly nonfocal  Data Reviewed: Basic Metabolic Panel:  Recent Labs Lab 05/05/13 0353 05/06/13 0437  NA 136* 139  K 4.7 4.6  CL 100 102  CO2 24 28  GLUCOSE 120* 89  BUN 25* 26*  CREATININE 1.43* 1.35  CALCIUM 8.8 8.9   Liver Function Tests: No results found for this basename: AST, ALT, ALKPHOS, BILITOT, PROT, ALBUMIN,  in the last 168 hours No results found for this basename: LIPASE, AMYLASE,  in the last 168 hours No results found for this basename: AMMONIA,  in the last 168 hours CBC:  Recent Labs Lab 05/02/13 0438 05/04/13 0415 05/05/13 0353 05/06/13 0437  WBC 8.5 10.0  --   --   HGB 12.1* 12.0* 11.2* 10.6*  HCT 36.9* 37.2* 35.0* 32.9*  MCV 85.2 85.3  --   --   PLT 140* 162  --   --    Cardiac Enzymes: No results found for this basename: CKTOTAL, CKMB, CKMBINDEX, TROPONINI,  in the last 168 hours BNP: No components found with this basename: POCBNP,  CBG: No results found  for this basename: GLUCAP,  in the last 168 hours  No results found for this or any previous visit (from the past 240 hour(s)).   Studies: Ir Ivc Filter Plmt / S&i /img Guid/mod Sed 05/06/2013    IMPRESSION: Successful infrarenal IVC filter placement. This is a temporary filter. It can be removed or remain in place to become permanent.   Electronically Signed   By: Maryclare Bean M.D.   On: 05/06/2013 12:17    Scheduled Meds: . atorvastatin  40 mg Oral q1800  . docusate sodium  100 mg Oral BID  . doxazosin  8 mg Oral QHS  . fesoterodine  8 mg Oral Daily   Continuous Infusions:

## 2013-05-07 NOTE — Progress Notes (Signed)
Clinical Social Work Department BRIEF PSYCHOSOCIAL ASSESSMENT 05/07/2013  Patient:  [name], [name]     Account Number:  [id number]     Admit date:  04/25/2013  Clinical Social Worker:  Renold Genta  Date/Time:  05/07/2013 03:20 PM  Referred by:  Physician  Date Referred:  05/07/2013 Referred for  SNF Placement   Other Referral:   Interview type:  Patient Other interview type:    PSYCHOSOCIAL DATA Living Status:  WIFE Admitted from facility:   Level of care:   Primary support name:  Baldomero Mirarchi (wife) ph#: [phone] Primary support relationship to patient:  SPOUSE Degree of support available:   good    CURRENT CONCERNS Current Concerns  Post-Acute Placement   Other Concerns:    SOCIAL WORK ASSESSMENT / PLAN CSW received consult from Dr. Charlies Silvers for SNF/Rehab placement.   Assessment/plan status:  Information/Referral to [employer] Other assessment/ plan:   Information/referral to community resources:   CSW completed FL2 and faxed information out to [facility] - provided list of faciltiies to patient & will follow-up with bed offers tomorrow.    PATIENT'S/FAMILY'S RESPONSE TO PLAN OF CARE: Patient states that he would be open to going to a SNF in [facility], that he had been to a SNF in [place] last Summer but did not have a good experience "would not want to get in a fight." CSW attempted to call patient's wife to confirm, but no answer at her home number. Patient states that his wife is in [place] and his daughter, [name] lives down the street from them.    CSW will follow-up tomorrow with bed offers when available.       Raynaldo Opitz, [facility] Clinical Social Worker cell #: [phone]

## 2013-05-07 NOTE — Progress Notes (Signed)
OT Cancellation Note  Patient Details Name: [name] MRN: [id number] DOB: 31-Mar-1929   Cancelled Treatment:    Reason Eval/Treat Not Completed: Other (comment) Pt remains on bedrest. Per urology note, need hospitalist to clear pt for OOB. Will check back once pt off bedrest.  Jules Schick [phone] 05/07/2013, 11:33 AM

## 2013-05-07 NOTE — Progress Notes (Signed)
Physical Therapy Treatment Patient Details Name: [name] MRN: [id number] DOB: [birth year] Today's Date: 05/07/2013 Time: 6073-7106 PT Time Calculation (min): 35 min  PT Assessment / Plan / Recommendation  History of Present Illness [name] returns today in f/u.  He has a history of a 3.62mm right renal stone and a 1.4cm LLP stone with microhematuria.  He thinks he passed a stone on the left about a month ago.   He has had no flank pain or gross hematuria.   he has no dysuria but he does have some urgency and UUI.   He has a history of a UTI in 2/14 and had another in 7/14 that was treated with fosfomycin but he doesn't think that helped, and his UA today looks infected.  He has a history of prostate cancer treated in 2008 . Pt is S/P  L percutaneous Nephrolithotomy on 04/27/13.   PT Comments   Bed rest order was lifted @ 750 this morning.  Assisted pt OOb to amb 1/2 unit using EVA walker for increased support as pt c/o B LE weakness.  Stated L LE "feeld better but is still tight". Assisted pt back to bed per pt request.   Follow Up Recommendations  Home health PT (pt declines SNF)     Does the patient have the potential to tolerate intense rehabilitation     Barriers to Discharge        Equipment Recommendations       Recommendations for Other Services    Frequency Min 3X/week   Progress towards PT Goals Progress towards PT goals: Progressing toward goals  Plan      Precautions / Restrictions Precautions Precautions: Fall Restrictions Weight Bearing Restrictions: No   Pertinent Vitals/Pain No c/o pain    Mobility  Bed Mobility Overal bed mobility: Needs Assistance Supine to sit: Supervision;Min guard Sit to supine: Min guard General bed mobility comments: increased time back to bed Transfers Overall transfer level: Needs assistance Equipment used:  (EVA walker) Transfers: Sit to/from Stand Sit to Stand: Min assist Stand pivot transfers: Min assist General  transfer comment: increased time Ambulation/Gait Ambulation/Gait assistance: Mod assist Ambulation Distance (Feet): 215 Feet Assistive device:  (EVA walker) Gait Pattern/deviations: Step-through pattern Gait velocity: decreased General Gait Details: Used EVA walker for increased support.  Pt tolerated amb 1/2 unit.  C/O B LE weakness.  L LE "feeling better" but still tight.     PT Goals (current goals can now be found in the care plan section)    Visit Information  Last PT Received On: 05/07/13 Assistance Needed: +1 History of Present Illness: [name] returns today in f/u.  He has a history of a 3.47mm right renal stone and a 1.4cm LLP stone with microhematuria.  He thinks he passed a stone on the left about a month ago.   He has had no flank pain or gross hematuria.   he has no dysuria but he does have some urgency and UUI.   He has a history of a UTI in 2/14 and had another in 7/14 that was treated with fosfomycin but he doesn't think that helped, and his UA today looks infected.  He has a history of prostate cancer treated in 2008 . Pt is S/P  L percutaneous Nephrolithotomy on 04/27/13.    Subjective Data      Cognition       Balance     End of Session PT - End of Session Equipment Utilized During Treatment:  Gait belt Activity Tolerance: Patient limited by fatigue Patient left: in bed;with call bell/phone within reach;with family/visitor present   Rica Koyanagi  PTA [facility]  Acute  Rehab Pager      [phone]

## 2013-05-08 DIAGNOSIS — I824Z9 Acute embolism and thrombosis of unspecified deep veins of unspecified distal lower extremity: Secondary | ICD-10-CM

## 2013-05-08 DIAGNOSIS — I824Y9 Acute embolism and thrombosis of unspecified deep veins of unspecified proximal lower extremity: Secondary | ICD-10-CM

## 2013-05-08 DIAGNOSIS — N200 Calculus of kidney: Principal | ICD-10-CM

## 2013-05-08 LAB — CBC
HEMATOCRIT: 35.1 % — AB (ref 39.0–52.0)
Hemoglobin: 11.4 g/dL — ABNORMAL LOW (ref 13.0–17.0)
MCH: 27.6 pg (ref 26.0–34.0)
MCHC: 32.5 g/dL (ref 30.0–36.0)
MCV: 85 fL (ref 78.0–100.0)
Platelets: 224 10*3/uL (ref 150–400)
RBC: 4.13 MIL/uL — ABNORMAL LOW (ref 4.22–5.81)
RDW: 14.5 % (ref 11.5–15.5)
WBC: 8.1 10*3/uL (ref 4.0–10.5)

## 2013-05-08 LAB — HEPARIN LEVEL (UNFRACTIONATED): Heparin Unfractionated: 0.44 IU/mL (ref 0.30–0.70)

## 2013-05-08 NOTE — Progress Notes (Signed)
Patient ID: [name], male   DOB: 1929-09-14, 80 y.o.   MRN: [id number]  [facility] PROGRESS NOTE  [name] DOB: Jul 13, 1929 DOA: 04/25/2013 PCP: [name], MD  Brief narrative: 80 year old male with past medical history of hypertension, BPH, history of prostate cancer and radiation treatment who presented to [facility] 04/25/2013 with right renal stone and LLP stone with microhematuria. Pt underwent left percutaneous nephrolithotomy on 05/03/2013. [facility] was consulted due to concern for his LLE swelling.   Doppler study revealed left lower extremity DVT. Doppler study repeated with some propagation of DVT so order placed for IVC filter.   Assessment and Plan:  Principal Problem:  Renal stone  - status post left percutaneous nephrolithotomy done 2/19  - management per GU  Active Problems:  DVT left lower extremity  - IVC filter placed 05/06/2013  - Dr. 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, also place compression stocking to the left leg - recommended 6 months of anticoagulation  - keep on heparin today and likely Coumadin tomorrow, he will need to be hospitalized until coumadin is therapeutic  Dyslipidemia  - Continue statin therapy  Acute renal failure - Cr now WNL, repeat BMP in AM  Code Status: full code  Family Communication: pt may require SNF placement on discharge so will ask SW assistance in discharge plan  Disposition Plan: per primary team   Leisa Lenz, MD  [facility] Pager [phone]  If 7PM-7AM, please contact night-coverage [email or web address] Password [email or web address] 05/08/2013, 1:04 PM   LOS: 13 days   HPI/Subjective: No events overnight.   Objective: Filed Vitals:   05/07/13 0610 05/07/13 1423 05/07/13 2125 05/08/13 0500  BP: 137/82 116/81 121/75 118/74  Pulse: 84 91 80 81  Temp: 98.4 F (36.9  C) 98.3 F (36.8 C) 98.9 F (37.2 C) 98.3 F (36.8 C)  TempSrc: Oral Oral Oral Oral  Resp: 18 16 20 20   Height:      Weight:      SpO2: 96% 95% 94% 94%    Intake/Output Summary (Last 24 hours) at 05/08/13 1304 Last data filed at 05/08/13 0900  Gross per 24 hour  Intake 562.83 ml  Output    750 ml  Net -187.17 ml    Exam:   General:  Pt is alert, follows commands appropriately, not in acute distress  Cardiovascular: Regular rate and rhythm, S1/S2, no murmurs, no rubs, no gallops  Respiratory: Clear to auscultation bilaterally, no wheezing, no crackles, no rhonchi  Abdomen: Soft, non tender, non distended, bowel sounds present, no guarding  Extremities: Left LE edema extending form ankle to thigh, non tender to palpation, pulses DP and PT palpable bilaterally  Neuro: Grossly nonfocal  Data Reviewed: Basic Metabolic Panel:  Recent Labs Lab 05/05/13 0353 05/06/13 0437  NA 136* 139  K 4.7 4.6  CL 100 102  CO2 24 28  GLUCOSE 120* 89  BUN 25* 26*  CREATININE 1.43* 1.35  CALCIUM 8.8 8.9   CBC:  Recent Labs Lab 05/02/13 0438 05/04/13 0415 05/05/13 0353 05/06/13 0437 05/08/13 0415  WBC 8.5 10.0  --   --  8.1  HGB 12.1* 12.0* 11.2* 10.6* 11.4*  HCT 36.9* 37.2* 35.0* 32.9* 35.1*  MCV 85.2 85.3  --   --  85.0  PLT 140* 162  --   --  224   Studies: No results found.  Scheduled Meds: . atorvastatin  40 mg Oral q1800  . docusate sodium  100 mg Oral BID  . doxazosin  8 mg Oral QHS  . fesoterodine  8 mg Oral Daily   Continuous Infusions: . heparin 1,750 Units/hr (05/08/13 0530)

## 2013-05-08 NOTE — Consult Note (Signed)
NAME:  [name], [name] NO.:  [id number]  MEDICAL RECORD NO.:  [id number]  LOCATION:  9211                         FACILITY:  [facility]  PHYSICIAN:  [name], M.D.  DATE OF BIRTH:  11-Jan-1930  DATE OF CONSULTATION: DATE OF DISCHARGE:                                CONSULTATION   REASON FOR CONSULTATION: 1. DVT of the left leg. 2. Nephrolithiasis.  HISTORY OF PRESENT ILLNESS:  [name] is a very nice 80 year old white gentleman.  He has been in good health.  He has problem with kidney stones.  He apparently had quite a few kidney stones in the left kidney.  He was admitted by Dr. [name] of Urology.  Dr. Jeffie Pollock went ahead and did a percutaneous nephrolithotomy.  This was done on the 19th.  The procedure went without difficulty.  Postop, [name] began to develop some leg swelling.  This was of his left leg.  He did have compression devices on his legs.  He had an ultrasound done initially on February 19th.  This showed extensive, occlusive DVT of left common femoral vein, left profunda femoral vein, popliteal vein, posterior tibial vein, and left peroneal vein.  He was subsequently anticoagulated.  Again, started on Lovenox and Coumadin.  He then had some blood with his urine.  I think he had, had some blood with the urine after his procedure.  The anticoagulation was stopped.  A repeat Doppler was done on the 21st.  This showed propagation of the thrombus.  He then had a filter placed.  Because of the concern with his hematuria, we were asked to provide some assistance with respect to management of the thromboembolic event.  He says his urine is getting better.  He has not noted any obvious blood in the urine.  He has never had any issues with blood clots in the past.  He has had bilateral knee surgeries.  He has had no problems with this.  He was ambulating after his procedure.  He says his left leg is getting a little bit better.   It is not swollen. It is not painful.  He has had a good appetite.  He has no abdominal pain.  He has had no cough or shortness of breath.  There has been no chest wall pain.  PAST MEDICAL HISTORY:  Remarkable for, 1. Hyperlipidemia. 2. Hypertension. 3. Cardiac arrhythmia.  ALLERGIES:  To PENICILLIN.  HOME MEDICATIONS:  Aspirin 325 mg p.o. daily, Cardura 8 mg p.o. at bedtime, Zocor 80 mg p.o. at bedtime, Detrol LA 4 mg p.o. b.i.d.  SOCIAL HISTORY:  Negative for tobacco or alcohol use.  He smoked over 20 years ago.  He was a Administrator.  He did serve in [employer] for I think 4 years.  FAMILY HISTORY:  Negative for any family history of blood clots.  REVIEW OF SYSTEMS:  As stated in the history of present illness.  PHYSICAL EXAMINATION:  GENERAL:  This is an elderly, but fairly well- nourished white gentleman in no obvious distress. VITAL SIGNS:  Temperature of 98.3, pulse 81, respiratory rate 20, blood pressure 118/74, oxygen saturation is 94% on room air. HEAD  AND NECK:  Normocephalic, atraumatic skull.  There are no ocular or oral lesions.  There are no palpable cervical or supraclavicular lymph nodes. LUNGS:  Clear bilaterally. CARDIAC:  Regular rate and rhythm with normal S1 and S2.  There are no murmurs, rubs, or bruits. ABDOMEN:  Soft.  He has good bowel sounds.  There is no fluid wave. There is no palpable abdominal mass.  There is no palpable hepatosplenomegaly. BACK:  No tenderness over the spine, ribs, or hips. EXTREMITIES:  Nonpitting edema of the left leg.  There is no plethora. He has good pulses in his feet.  He has no obvious venous cord in the left leg.  Right leg is unremarkable.  He has good strength in his muscles. SKIN:  No rashes, ecchymosis, or petechia. NEUROLOGICAL:  No focal neurological deficits.  LABORATORY STUDIES:  White cell count is 8.1, hemoglobin 11.4, hematocrit 35.1, platelet count 224.  BUN is 26 and creatinine 1.35. Calcium  8.9.  IMPRESSION:  [name] is a very nice 80 year old gentleman.  He has nephrolithiasis.  He now has an extensive thromboembolic event of his left leg.  He has a filter placed because of concerns for hematuria.  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  He is at risk for post-phlebitic syndrome.  I will keep him on heparin for right now.  I do not think that we could use one of the new oral anticoagulants.  I think with his age, risk of bleeding, it would be hard to reverse one of the new oral anticoagulants right now.  As such, I think Coumadin is going to have to be what we use.  I would favor 6 months of anticoagulation for him.  He has the IVC filter in.  It certainly could be possible that this could be removed at some point down the road, depending on how well we resolve his thromboembolic disease.  I do not think that him getting out of bed and walking after he was found to have the blood clot caused any further problems.  In fact, it is recommended that patients who do have acute DVT be active as soon as possible.  Again, we will have to be cautious with him.  I would keep him on heparin for right now.  I probably would start his Coumadin tomorrow.  I would have him stay in the hospital until his INR is therapeutic.  I will try to keep his INR, initially, between 2.5 and 3 for the first 3 months and then get his INR down to 2 to 2.5.  I had a good time talking with [name].  I explained what we needed to do.  He understood this.  I appreciate the opportunity to see [name].  He is a real nice guy.     [name], M.D.     PRE/MEDQ  D:  05/08/2013  T:  05/08/2013  Job:  [id number]  cc:   [name], M.D. Fax: [place] Jeffie Pollock, M.D. Fax: [phone]

## 2013-05-08 NOTE — Consult Note (Signed)
#   [id number]  Is consult note.  He has an extensive DVT in the left leg. I would favor anticoagulating him despite the IVC filter. He was very active prior to his kidney surgery. As such, I want to try to make sure that he has a good quality of life and is active after this DVT is treated.   He is at risk for post phlebitic syndrome. I think is probably going to need a compression stocking for the left leg. We'll see if this can be applied.  I would favor of 6 months of anticoagulation.  I would not start Coumadin until tomorrow. I would just keep on heparin for today. He will need to be hospitalized until his Coumadin is therapeutic.  We will follow along.  Joylene John 6:9

## 2013-05-08 NOTE — Op Note (Signed)
NAME:  Elijah, [name] NO.:  [id number]  MEDICAL RECORD NO.:  [id number]  LOCATION:                                 FACILITY:  PHYSICIAN:  Marshall Cork. Jeffie Pollock, M.D.    DATE OF BIRTH:  19-Nov-1929  DATE OF PROCEDURE:  05/03/2013 DATE OF DISCHARGE:                              OPERATIVE REPORT   PROCEDURE:  Second-look left percutaneous nephrolithotomy.  PREOPERATIVE DIAGNOSIS:  Residual stone fragments in the left kidney.  POSTOPERATIVE DIAGNOSIS:  Residual stone fragments in the left kidney.  SURGEON:  Marshall Cork. Jeffie Pollock, M.D.  ANESTHESIA:  General.  SPECIMEN:  Stone fragments.  DRAINS:  Foley catheter and 6-French Kumpe catheter.  COMPLICATIONS:  None.  BLOOD LOSS:  Minimal.  INDICATIONS:  [name] is an 80 year old, white male, who had a 21-mm left renal pelvic stone who underwent a first-look percutaneous nephrolithotomy last week.  He had bleeding and only partial stone removal was performed.  This was confirmed on followup CT scan.  He presents today for second-look for removal of residual fragments.  On his preoperative evaluation today, he was complaining of some left leg swelling, but reports that he has had intermittent swelling since his knee replacement on the left 20 years ago, and feels this is aggravated by recent ambulation.  On exam, he had edema of the left lower extremity to approximately at the top of the knee.  He has no tightness in the calf.  No erythema.  No tenderness and negative Homans sign.  Since it was a chronic issue felt that proceeding with this limited procedure would be a reasonable approach.  FINDINGS AND PROCEDURE:  He had been on Rocephin for the last week.  He was taken to the operating room where general anesthetic was induced on the holding room stretcher.  An 18-French Foley catheter was placed using sterile technique.  He was then rolled prone on chest rolls with care to pad all pressure points.  His left  percutaneous site was prepped with Betadine solution.  He was draped in usual sterile fashion.  A 22-French Councill nephrostomy catheter was removed.  The safety catheter was left in place, and a Super Stiff guidewire was placed to the bladder through the safety catheter to prevent and removal of the safety catheter.  At this point, the 17-French flexible nephroscope was passed through the tract without difficulty.  There was no bleeding.  During the passage, some small fragments flushed from the tract.  Once in the collecting system, several fragments were identified, 2 large fragments approximately 6-7 mm in size as consistent with the CT findings were identified and removed with a basket.  Additional small fragments ranging from 2-5 mm were removed.  Once all mobile fragments were removed, final inspection revealed 1 small fragment that was not embedded in the mucosa and could not be teased loose.  There were some Randall plaques as well.  No active bleeding was noted.  I did note that the safety catheter appeared to go transmucosal down into the ureter, so I passed a guidewire through the nephroscope into the proper channel through the renal pelvis and then removed the scope.  The  safety catheter was removed and was replaced over the working wire to just above the bladder.  The wire was removed and the catheter was secured to the skin with a 2-0 silk suture.  This safety catheter was capped.  The drapes were removed and a dressing was applied.  I will plan to remove the safety catheter in the morning and anticipate urine leak from the flank through the night with that tube in position.  He was then rolled back supine on the recovery room stretcher.  His anesthetic was reversed.  He was moved to recovery room in stable condition.  There were no complications.     Marshall Cork. Jeffie Pollock, M.D.     JJW/MEDQ  D:  05/03/2013  T:  05/04/2013  Job:  [id number]

## 2013-05-08 NOTE — Progress Notes (Signed)
ANTICOAGULATION CONSULT NOTE - Follow Up Consult  Pharmacy Consult for Heparin Indication: DVT  Allergies  Allergen Reactions  . Penicillins Other (See Comments)    HIVES     Patient Measurements: Height: 6\' 2"  (188 cm) Weight: 280 lb 13.9 oz (127.4 kg) IBW/kg (Calculated) : 82.2 Heparin Dosing Weight:   Vital Signs: Temp: 98.9 F (37.2 C) (02/23 2125) Temp src: Oral (02/23 2125) BP: 121/75 mmHg (02/23 2125) Pulse Rate: 80 (02/23 2125)  Labs:  Recent Labs  05/06/13 0437 05/07/13 2307 05/08/13 0415  HGB 10.6*  --  11.4*  HCT 32.9*  --  35.1*  PLT  --   --  224  APTT 43*  --   --   LABPROT 15.9*  --   --   INR 1.30  --   --   HEPARINUNFRC  --  0.34 0.44  CREATININE 1.35  --   --     Estimated Creatinine Clearance: 60.9 ml/min (by C-G formula based on Cr of 1.35).   Medications:  Infusions:  . heparin 1,750 Units/hr (05/07/13 1515)    Assessment: Patient with two heparin levels at goal.  No issues noted per RN.  Goal of Therapy:  Heparin level 0.3-0.7 units/ml Monitor platelets by anticoagulation protocol: Yes   Plan:  Continue heparin at current rate. Begin daily heparin levels for 2/25  [name] 05/08/2013,4:53 AM

## 2013-05-08 NOTE — Evaluation (Signed)
Occupational Therapy Evaluation Patient Details Name: [name] MRN: [id number] DOB: 29-Oct-1929 Today's Date: 05/08/2013 Time: 2355-7322 OT Time Calculation (min): 22 min  OT Assessment / Plan / Recommendation History of present illness Mr. Ratterman returns today in f/u.  He has a history of a 3.76mm right renal Ala Kratz and a 1.4cm LLP Contrell Ballentine with microhematuria.  He thinks he passed a Daxton Nydam on the left about a month ago.   He has had no flank pain or gross hematuria.   he has no dysuria but he does have some urgency and UUI.   He has a history of a UTI in 2/14 and had another in 7/14 that was treated with fosfomycin but he doesn't think that helped, and his UA today looks infected.  He has a history of prostate cancer treated in 2008 . Pt is S/P  L percutaneous Nephrolithotomy on 04/27/13.   Clinical Impression   Pt limited by pain this session. Nursing in to give pain meds. 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 He will benefit from skilled OT services to improve ADL independence for next venue.    OT Assessment  Patient needs continued OT Services    Follow Up Recommendations  SNF;Supervision/Assistance - 24 hour    Barriers to Discharge      Equipment Recommendations  None recommended by OT    Recommendations for Other Services    Frequency  Min 2X/week    Precautions / Restrictions Precautions Precautions: Fall Restrictions Weight Bearing Restrictions: No   Pertinent Vitals/Pain 9/10 L flank; nursing made aware.     ADL  Eating/Feeding: Simulated;Independent Where Assessed - Eating/Feeding: Chair Grooming: Simulated;Wash/dry hands;Set up Where Assessed - Grooming: Supported sitting Upper Body Bathing: Simulated;Chest;Right arm;Left arm;Abdomen;Set up Where Assessed - Upper Body Bathing: Unsupported sitting Lower Body Bathing: Simulated;Other (comment);Maximal assistance (+2 physical assist and  safety) Where Assessed - Lower Body Bathing: Supported sit to stand Upper Body Dressing: Simulated;Minimal assistance Where Assessed - Upper Body Dressing: Unsupported sitting Lower Body Dressing: Simulated;Maximal assistance;Other (comment) (+2 for safety and physical assist) Where Assessed - Lower Body Dressing: Supported sit to stand Toilet Transfer: Simulated;Moderate assistance;Other (comment) (+2 for safety and physical assist. Pt unsteady and 9/10 pain) Toilet Transfer Method: Stand pivot Toileting - Clothing Manipulation and Hygiene: Simulated;+1 Total assistance;Other (comment) (+2 safety and physical assist) Where Assessed - Toileting Clothing Manipulation and Hygiene: Sit to stand from 3-in-1 or toilet Equipment Used: Other (comment) (EVA walker) ADL Comments: Pt complaining of 9/10 pain flank when OT arrived and wanted to return to bed despite nursing encouraging pt to try to sit up a little longer. Nursing gave pt pain meds at start of session. Pt used EVA walker to step around to bed but was very shaky and  unsteady on his feet in standing so +2 utilized for safety and assist. Pt using handles on EVA walker to support self so currently not able to let go of walker to perform hygiene or clothing management. Pt states he is agreeable to SNF at d/c to regain independence.  He is a caregiver for his wife. OT order written for graded compression stockings. Nursing made aware of order written and that OT doesnt address these stockings. Nursing to check on. Practiced with reacher to doff L sock but pt's foot was too wide to successfully use regular sock aid. Demonstrated overall technique of sock aid but pt declines obtaining any  other AE at this time. He owns a Secondary school teacher. Discussed how he can use the reacher to don pants. He states he wont worry about socks and pants until he can bring his LEs up onto the bed as he did PTA.     OT Diagnosis: Generalized weakness;Acute pain  OT Problem List:  Decreased strength;Decreased knowledge of use of DME or AE;Pain OT Treatment Interventions: Self-care/ADL training;DME and/or AE instruction;Therapeutic activities;Patient/family education   OT Goals(Current goals can be found in the care plan section) Acute Rehab OT Goals Patient Stated Goal: wants to go to rehab to regain independence. OT Goal Formulation: With patient Time For Goal Achievement: 05/22/13 Potential to Achieve Goals: Good  Visit Information  Last OT Received On: 05/08/13 Assistance Needed: +2 History of Present Illness: Mr. Stemm returns today in f/u.  He has a history of a 3.28mm right renal Angalena Cousineau and a 1.4cm LLP Kimmy Parish with microhematuria.  He thinks he passed a Ayn Domangue on the left about a month ago.   He has had no flank pain or gross hematuria.   he has no dysuria but he does have some urgency and UUI.   He has a history of a UTI in 2/14 and had another in 7/14 that was treated with fosfomycin but he doesn't think that helped, and his UA today looks infected.  He has a history of prostate cancer treated in 2008 . Pt is S/P  L percutaneous Nephrolithotomy on 04/27/13.       Prior [place] expects to be discharged to:: Private residence Living Arrangements: Spouse/significant other Available Help at Discharge: Family Type of Home: House Home Access: Stairs to enter Technical brewer of Steps: 1 Home Layout: One level Home Equipment: Environmental consultant - 2 wheels;Bedside commode Additional Comments: Does not want to go to rehab again, states wife is unable to help, daughter lives nex door. 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 Pt unable to lift LEs onto bed himself. Transfers Overall transfer level: Needs assistance Equipment used:  (EVA walker) Transfers: Sit to/from [facility] Sit to Stand: Mod assist;+2 physical assistance;+2 safety/equipment Stand pivot transfers: Mod assist;+2 physical assistance;+2 safety/equipment General transfer comment: pt with 9/10 flank pain and needed +2 assist for safety and some physical assist. verbal cues for hand placement.     Exercise     Balance     End of Session OT - End of Session Equipment Utilized During Treatment: Gait belt Activity Tolerance: Patient limited by pain Patient left: in bed;with call bell/phone within reach  GO     Jules Schick [phone] 05/08/2013, 1:20 PM

## 2013-05-08 NOTE — Progress Notes (Signed)
Patient ID: [name], male   DOB: [birth year], 80 y.o.   MRN: [id number] 5 Days Post-Op  Subjective: Mr. Melander is without complaints.  He has been seen by Dr. Marin Olp for the DVT and it was recommended that he be fitted with a compression stocking and be anticoagulated for 6 months to reduce the risk of post phlebitic syndrome.   He continues to void well and the urine has minimal hematuria.   He ambulated twice yesterday. ROS: Negative except as above.   He has no pain.   Objective: Vital signs in last 24 hours: Temp:  [98.3 F (36.8 C)-98.9 F (37.2 C)] 98.3 F (36.8 C) (02/24 0500) Pulse Rate:  [80-91] 81 (02/24 0500) Resp:  [16-20] 20 (02/24 0500) BP: (116-121)/(74-81) 118/74 mmHg (02/24 0500) SpO2:  [94 %-95 %] 94 % (02/24 0500)  Intake/Output from previous day: 02/23 0701 - 02/24 0700 In: 922.8 [P.O.:840; I.V.:82.8] Out: 1020 [Urine:1020] Intake/Output this shift:    General appearance: alert and no distress Resp: clear to auscultation bilaterally Cardio: regular rate and rhythm GI: soft, non-tender; bowel sounds normal; no masses,  no organomegaly Incision/Wound: I removed the 2 stitches from his Left NT site.  The wound was clean and dry.  Ext:  Right calf is soft and non-tender without swelling.   Left LE appears to have reduced swelling and no tenderness or erythema.   Lab Results:   Recent Labs  05/06/13 0437 05/08/13 0415  WBC  --  8.1  HGB 10.6* 11.4*  HCT 32.9* 35.1*  PLT  --  224   BMET  Recent Labs  05/06/13 0437  NA 139  K 4.6  CL 102  CO2 28  GLUCOSE 89  BUN 26*  CREATININE 1.35  CALCIUM 8.9   PT/INR  Recent Labs  05/06/13 0437  LABPROT 15.9*  INR 1.30   ABG No results found for this basename: PHART, PCO2, PO2, HCO3,  in the last 72 hours  Studies/Results: Ir Ivc Filter Plmt / S&i /img Guid/mod Sed  05/06/2013   CLINICAL DATA:  DVT.  Hematuria.  EXAM: IVC FILTER,INFERIOR VENA CAVOGRAM  MEDICATIONS AND MEDICAL HISTORY:  Versed 1 mg, Fentanyl 25 mcg.  Additional Medications: None.  ANESTHESIA/SEDATION: Moderate sedation time: 10 minutes  CONTRAST:  40 cc Omnipaque 300  FLUOROSCOPY TIME:  1.0 min  PROCEDURE: The procedure, risks, benefits, and alternatives were explained to the patient. Questions regarding the procedure were encouraged and answered. The patient understands and consents to the procedure.  The right neck was prepped with Betadine in a sterile fashion, and a sterile drape was applied covering the operative field. A sterile gown and sterile gloves were used for the procedure.  The right internal jugular vein was noted to be patent initially with ultrasound. Under sonographic guidance, a micropuncture needle was inserted into the right internal jugular vein (Ultrasound image documentation was performed). It was removed over an 018 wire which was upsized to a [place]. The sheath was inserted over the wire and into the IVC. IVC venography was performed.  The temporary [place] filter was then deployed in the infrarenal IVC. The sheath was removed and hemostasis was achieved with direct pressure.  COMPLICATIONS: None  FINDINGS: IVC venography confirms renal vein inflow at L1 and no venous anomaly.  The image demonstrates placement of an IVC filter with its tip at the L1-2 disc.  IMPRESSION: Successful infrarenal IVC filter placement. This is a temporary filter. It can be removed or remain in place  to become permanent.   Electronically Signed   By: Maryclare Bean M.D.   On: 05/06/2013 12:17    Anti-infectives: Anti-infectives   Start     Dose/Rate Route Frequency Ordered Stop   05/03/13 0600  cefTRIAXone (ROCEPHIN) 2 g in dextrose 5 % 50 mL IVPB     2 g 100 mL/hr over 30 Minutes Intravenous 30 min pre-op 05/02/13 0902 05/03/13 1112   04/28/13 1200  cefTRIAXone (ROCEPHIN) 1 g in dextrose 5 % 50 mL IVPB  Status:  Discontinued     1 g 100 mL/hr over 30 Minutes Intravenous Every 24 hours 04/27/13 1040 05/04/13 0731   04/26/13  1800  tobramycin (NEBCIN) 700 mg in dextrose 5 % 100 mL IVPB  Status:  Discontinued     700 mg 117.5 mL/hr over 60 Minutes Intravenous Every 24 hours 04/26/13 1731 04/27/13 0710   04/26/13 0853  ciprofloxacin (CIPRO) 400 MG/200ML IVPB    Comments:  Margaretmary Dys   : cabinet override      04/26/13 0853 04/26/13 0943   04/26/13 0730  ciprofloxacin (CIPRO) IVPB 400 mg     400 mg 200 mL/hr over 60 Minutes Intravenous On call 04/25/13 1423 04/26/13 1029   04/25/13 1730  [MAR Hold]  tobramycin (NEBCIN) 700 mg in dextrose 5 % 100 mL IVPB  Status:  Discontinued     (On MAR Hold since 04/26/13 1014)   700 mg 117.5 mL/hr over 60 Minutes Intravenous Every 24 hours 04/25/13 1637 04/26/13 1637   04/25/13 1415  tobramycin (NEBCIN) 60 mg in dextrose 5 % 50 mL IVPB  Status:  Discontinued    Comments:  PER PHARMACY DOSING   60 mg 103 mL/hr over 30 Minutes Intravenous  Once 04/25/13 1409 04/25/13 1419      Current Facility-Administered Medications  Medication Dose Route Frequency Provider Last Rate Last Dose  . acetaminophen (TYLENOL) tablet 650 mg  650 mg Oral Q4H PRN [name], MD   650 mg at 04/28/13 2127  . atorvastatin (LIPITOR) tablet 40 mg  40 mg Oral q1800 [name], MD   40 mg at 05/07/13 1714  . bisacodyl (DULCOLAX) suppository 10 mg  10 mg Rectal Daily PRN [name], MD   10 mg at 05/04/13 1402  . diphenhydrAMINE (BENADRYL) injection 12.5 mg  12.5 mg Intravenous Q6H PRN [name], MD       Or  . diphenhydrAMINE (BENADRYL) 12.5 MG/5ML elixir 12.5 mg  12.5 mg Oral Q6H PRN [name], MD   12.5 mg at 04/30/13 1828  . docusate sodium (COLACE) capsule 100 mg  100 mg Oral BID [name], MD   100 mg at 05/07/13 8921  . doxazosin (CARDURA) tablet 8 mg  8 mg Oral QHS [name], MD   8 mg at 05/07/13 2140  . fesoterodine (TOVIAZ) tablet 8 mg  8 mg Oral Daily [name], MD   8 mg at 05/07/13 0917  . heparin ADULT infusion 100 units/mL (25000 units/250 mL)  1,750 Units/hr Intravenous Continuous  Donald Prose Runyon, RPH 17.5 mL/hr at 05/08/13 0530 1,750 Units/hr at 05/08/13 0530  . HYDROmorphone (DILAUDID) injection 0.5-1 mg  0.5-1 mg Intravenous Q2H PRN [name], MD   1 mg at 05/04/13 2100  . ondansetron (ZOFRAN) injection 4 mg  4 mg Intravenous Q4H PRN [name], MD   4 mg at 05/04/13 2100  . oxyCODONE-acetaminophen (PERCOCET/ROXICET) 5-325 MG per tablet 1-2 tablet  1-2 tablet Oral Q4H PRN [name], MD  2 tablet at 05/07/13 2141  . traMADol (ULTRAM) tablet 50 mg  50 mg Oral QID PRN [name], MD   50 mg at 05/04/13 1943    Assessment: s/p Procedure(s): 2ND STAGE LEFT PERCUTANEOUS NEPHROLITHOTOMY  History of persistent UTI with left renal stone now s/p 1st and 2nd stage left PCNL with postop DVT now being managed with heparin.  Plan: Continue with recommendations from Dr. Marin Olp. Continue PT and increase ambulation and activity as tolerated.     LOS: 13 days    Lorri Fukuhara J 05/08/2013

## 2013-05-09 LAB — CBC
HCT: 33.8 % — ABNORMAL LOW (ref 39.0–52.0)
Hemoglobin: 10.9 g/dL — ABNORMAL LOW (ref 13.0–17.0)
MCH: 27.4 pg (ref 26.0–34.0)
MCHC: 32.2 g/dL (ref 30.0–36.0)
MCV: 84.9 fL (ref 78.0–100.0)
PLATELETS: 261 10*3/uL (ref 150–400)
RBC: 3.98 MIL/uL — ABNORMAL LOW (ref 4.22–5.81)
RDW: 14.4 % (ref 11.5–15.5)
WBC: 8.3 10*3/uL (ref 4.0–10.5)

## 2013-05-09 LAB — BASIC METABOLIC PANEL
BUN: 24 mg/dL — AB (ref 6–23)
CALCIUM: 9.1 mg/dL (ref 8.4–10.5)
CO2: 26 mEq/L (ref 19–32)
Chloride: 101 mEq/L (ref 96–112)
Creatinine, Ser: 1.32 mg/dL (ref 0.50–1.35)
GFR calc Af Amer: 57 mL/min — ABNORMAL LOW (ref >90)
GFR, EST NON AFRICAN AMERICAN: 49 mL/min — AB (ref >90)
GLUCOSE: 96 mg/dL (ref 70–99)
Potassium: 4.4 mEq/L (ref 3.7–5.3)
Sodium: 137 mEq/L (ref 137–147)

## 2013-05-09 LAB — URINALYSIS, ROUTINE W REFLEX MICROSCOPIC
BILIRUBIN URINE: NEGATIVE
Glucose, UA: NEGATIVE mg/dL
Ketones, ur: NEGATIVE mg/dL
Nitrite: NEGATIVE
PROTEIN: NEGATIVE mg/dL
Specific Gravity, Urine: 1.017 (ref 1.005–1.030)
UROBILINOGEN UA: 1 mg/dL (ref 0.0–1.0)
pH: 6.5 (ref 5.0–8.0)

## 2013-05-09 LAB — URINE MICROSCOPIC-ADD ON

## 2013-05-09 LAB — PROTIME-INR
INR: 1.25 (ref 0.00–1.49)
Prothrombin Time: 15.4 seconds — ABNORMAL HIGH (ref 11.6–15.2)

## 2013-05-09 LAB — HEPARIN LEVEL (UNFRACTIONATED)
Heparin Unfractionated: 0.63 IU/mL (ref 0.30–0.70)
Heparin Unfractionated: 0.62 IU/mL (ref 0.30–0.70)

## 2013-05-09 MED ORDER — POLYETHYLENE GLYCOL 3350 17 G PO PACK
17.0000 g | PACK | Freq: Two times a day (BID) | ORAL | Status: DC
  Administered 2013-05-09 – 2013-05-11 (×4): 17 g via ORAL
  Filled 2013-05-09 (×11): qty 1

## 2013-05-09 MED ORDER — WARFARIN SODIUM 7.5 MG PO TABS
7.5000 mg | ORAL_TABLET | Freq: Once | ORAL | Status: AC
  Administered 2013-05-09: 7.5 mg via ORAL
  Filled 2013-05-09: qty 1

## 2013-05-09 MED ORDER — HEPARIN (PORCINE) IN NACL 100-0.45 UNIT/ML-% IJ SOLN
1550.0000 [IU]/h | INTRAMUSCULAR | Status: DC
  Administered 2013-05-09: 1650 [IU]/h via INTRAVENOUS
  Administered 2013-05-10 (×2): 1550 [IU]/h via INTRAVENOUS
  Filled 2013-05-09 (×4): qty 250

## 2013-05-09 MED ORDER — WARFARIN - PHARMACIST DOSING INPATIENT: Freq: Every day | Status: DC

## 2013-05-09 NOTE — Progress Notes (Signed)
PROGRESS NOTE  [name] YQI:[id number] DOB: January 23, 1930 DOA: 04/25/2013 PCP: [name], MD  Assessment/Plan: Renal stone  - status post left percutaneous nephrolithotomy done 2/19  - management per GU  DVT left lower extremity  - IVC filter placed 05/06/2013  - Dr. Jonette Eva consulted for further recommendations, we appreciate input  - recommendation is to start anticoagulation wit Heparin despite IVC filter due to risk of post phlebitic syndrome, also place compression stocking to the left leg  - recommended 6 months of anticoagulation  - continue heparin, start Coumadin tonight.  Dyslipidemia  - Continue statin therapy  Acute renal failure  - Cr now WNL, repeat BMP in AM   Antibiotics - none  HPI/Subjective: - feeling better  Objective: Filed Vitals:   05/08/13 0500 05/08/13 1357 05/08/13 2050 05/09/13 0456  BP: 118/74 110/67 124/72 111/70  Pulse: 81 77 85 83  Temp: 98.3 F (36.8 C) 98.3 F (36.8 C) 98.4 F (36.9 C) 98.4 F (36.9 C)  TempSrc: Oral Oral Oral Oral  Resp: 20 18 18 18   Height:      Weight:      SpO2: 94% 95% 95% 92%    Intake/Output Summary (Last 24 hours) at 05/09/13 1339 Last data filed at 05/09/13 0700  Gross per 24 hour  Intake  762.5 ml  Output   1275 ml  Net -512.5 ml   Filed Weights   04/25/13 1422 04/26/13 1800 04/27/13 0400  Weight: 127.914 kg (282 lb) 126.6 kg (279 lb 1.6 oz) 127.4 kg (280 lb 13.9 oz)    Exam:   General:  NAD  Cardiovascular: regular rate and rhythm, without MRG  Respiratory: good air movement, clear to auscultation throughout, no wheezing, ronchi or rales  Abdomen: soft, not tender to palpation, positive bowel sounds  MSK: LLE swelling, palpable pulses  Neuro: non focal  Data Reviewed: Basic Metabolic Panel:  Recent Labs Lab 05/05/13 0353 05/06/13 0437 05/09/13 0447  NA 136* 139 137  K 4.7 4.6 4.4  CL 100 102 101  CO2 24 28 26   GLUCOSE 120* 89 96  BUN 25* 26* 24*  CREATININE 1.43*  1.35 1.32  CALCIUM 8.8 8.9 9.1   CBC:  Recent Labs Lab 05/04/13 0415 05/05/13 0353 05/06/13 0437 05/08/13 0415 05/09/13 0447  WBC 10.0  --   --  8.1 8.3  HGB 12.0* 11.2* 10.6* 11.4* 10.9*  HCT 37.2* 35.0* 32.9* 35.1* 33.8*  MCV 85.3  --   --  85.0 84.9  PLT 162  --   --  224 261   Studies: No results found.  Scheduled Meds: . atorvastatin  40 mg Oral q1800  . docusate sodium  100 mg Oral BID  . doxazosin  8 mg Oral QHS  . fesoterodine  8 mg Oral Daily  . warfarin  7.5 mg Oral ONCE-1800  . Warfarin - Pharmacist Dosing Inpatient   Does not apply q1800   Continuous Infusions: . heparin 1,650 Units/hr (05/09/13 1035)    Principal Problem:   DVT, lower extremity Active Problems:   Renal stone   Junctional cardiac arrhythmia   Severe sinus bradycardia   Infection of urinary tract   Thrombocytopenia, unspecified   Gross hematuria   Time spent: 15  This note has been created with Surveyor, quantity. Any transcriptional errors are unintentional.   Marzetta Board, MD [facility] Pager [phone]. If 7 PM - 7 AM, please contact night-coverage at [email or web address], password [facility] 05/09/2013, 1:39  PM  LOS: 14 days

## 2013-05-09 NOTE — Progress Notes (Signed)
Physical Therapy Treatment Patient Details Name: [name] MRN: [id number] DOB: 23-Jun-1929 Today's Date: 05/09/2013 Time: 1100-1145 PT Time Calculation (min): 45 min  PT Assessment / Plan / Recommendation  History of Present Illness Mr. Dinneen returns today in f/u.  He has a history of a 3.72mm right renal stone and a 1.4cm LLP stone with microhematuria.  He thinks he passed a stone on the left about a month ago.   He has had no flank pain or gross hematuria.   he has no dysuria but he does have some urgency and UUI.   He has a history of a UTI in 2/14 and had another in 7/14 that was treated with fosfomycin but he doesn't think that helped, and his UA today looks infected.  He has a history of prostate cancer treated in 2008 . Pt is S/P  L percutaneous Nephrolithotomy on 04/27/13.   PT Comments   Assisted pt OOB with increased time and use of rails.  Amb in hallway using EVA walker for increased support.  Daughter present and observed.  Assisted pt to BR for a BM.  Required increased time and VC's on safety negotiating AD.  Positioned in recliner. Due to increased LOS and weakness, pt and family agree to [place] at [facility].  Follow Up Recommendations  SNF     Does the patient have the potential to tolerate intense rehabilitation     Barriers to Discharge        Equipment Recommendations       Recommendations for Other Services    Frequency Min 3X/week   Progress towards PT Goals Progress towards PT goals: Progressing toward goals  Plan      Precautions / Restrictions Precautions Precautions: Fall Restrictions Weight Bearing Restrictions: No   Pertinent Vitals/Pain     Mobility  Bed Mobility Overal bed mobility: Needs Assistance Bed Mobility: Supine to Sit Supine to sit: Supervision;Min guard General bed mobility comments: increased time and use of rails Transfers Overall transfer level: Needs assistance Equipment used:  (EVA walker) Transfers: Sit to/from Stand Sit to  Stand: Min assist;Mod assist General transfer comment: used EVA walker for increased support due to MAX c/o B LE weakness.  Required increased time and assist on/off toilet. Ambulation/Gait Ambulation/Gait assistance: Mod assist Ambulation Distance (Feet): 350 Feet Assistive device:  (Eva walker) Gait Pattern/deviations: Step-through pattern Gait velocity: decreased General Gait Details: Used EVA walker for increased support.  Pt tolerated amb 1/2 unit.  C/O B LE weakness.  L LE "feeling better" but still tight.  C/o overall weakness with increased LOS.     PT Goals (current goals can now be found in the care plan section)    Visit Information  Last PT Received On: 05/09/13 Assistance Needed: +1 History of Present Illness: Mr. Coolman returns today in f/u.  He has a history of a 3.61mm right renal stone and a 1.4cm LLP stone with microhematuria.  He thinks he passed a stone on the left about a month ago.   He has had no flank pain or gross hematuria.   he has no dysuria but he does have some urgency and UUI.   He has a history of a UTI in 2/14 and had another in 7/14 that was treated with fosfomycin but he doesn't think that helped, and his UA today looks infected.  He has a history of prostate cancer treated in 2008 . Pt is S/P  L percutaneous Nephrolithotomy on 04/27/13.    Subjective  Data      Cognition       Balance     End of Session PT - End of Session Equipment Utilized During Treatment: Gait belt Activity Tolerance: Patient limited by fatigue Patient left: in chair;with call bell/phone within reach;with family/visitor present   Rica Koyanagi  PTA [facility]  Acute  Rehab Pager      [phone]

## 2013-05-09 NOTE — Progress Notes (Signed)
ANTICOAGULATION CONSULT NOTE - Follow Up Consult  Pharmacy Consult for Heparin Indication: DVT  Allergies  Allergen Reactions  . Penicillins Other (See Comments)    HIVES     Patient Measurements: Height: 6\' 2"  (188 cm) Weight: 280 lb 13.9 oz (127.4 kg) IBW/kg (Calculated) : 82.2 Heparin Dosing Weight:   Vital Signs: Temp: 98.4 F (36.9 C) (02/25 1351) Temp src: Oral (02/25 1351) BP: 123/78 mmHg (02/25 1351) Pulse Rate: 81 (02/25 1351)  Labs:  Recent Labs  05/08/13 0415 05/09/13 0447 05/09/13 1535  HGB 11.4* 10.9*  --   HCT 35.1* 33.8*  --   PLT 224 261  --   LABPROT  --  15.4*  --   INR  --  1.25  --   HEPARINUNFRC 0.44 0.63 0.62  CREATININE  --  1.32  --     Estimated Creatinine Clearance: 62.3 ml/min (by C-G formula based on Cr of 1.32).   Medications:  Infusions:  . heparin 1,650 Units/hr (05/09/13 1035)    Assessment: 36 yoM with PMHx HTN, BPH, hx prostate cancer and radiation, underwent left percutaneous nephrolithotomy and then found to have LLE DVT with propagation.  Pt initially started on lovenox and warfarn which was subsequently held due to hematuria.  IVC filter placed 2/22 and pt has been started on IV heparin per oncology recommendations.  Patient started warfarin today.   2/25 PM Heparin level = 0.62 on 1650 units/hr which is not really decreased from this morning's level despite heparin rate decrease. No infusion line issues per RN, however the patient is still having minimal bleeding at left flank from where nephrostomy tube was removed (several days ago), much improved from this AM, MD is aware and wants to continue with heparin infusion and warfarin dosing. CBC stable.   Heparin dosing weight = 110kg.    Goal of Therapy:  Heparin level 0.3-0.7 units/ml Monitor platelets by anticoagulation protocol: Yes   Plan:  Reduce heparin to 1550 units/hr. Heparin level, PT/INR and CBC in AM Monitor for bleeding  Thank you for the  consult.  Johny Drilling, PharmD, BCPS Pager: [phone] Pharmacy: [phone] 05/09/2013 5:35 PM

## 2013-05-09 NOTE — Progress Notes (Signed)
Mr. Busk is doing well.  No bleeding on heparin. Left leg looks a little bit better. Does not appear to be a swollen. He does have some compression stockings on.  I think we are probably start the Coumadin now. We will have pharmacy help [place] with this.  He is getting out of bed little bit.  Is no complaint of pain. He's had no nausea vomiting. He's had no cough. She's had no fever.  .His vital signs are stable. Blood pressure 111/70. Temperature is 98.4. Lungs are clear. Cardiac exam regular in rhythm. He is a 1/6 systolic ejection murmur. Abdomen is soft. It is mildly obese. There is no palpable hepato- splenomegaly. Extremities shows some mild nonpitting edema of the left leg. There is no erythema of the left leg. She has good pulses in his distal extremities. Skin exam no ecchymoses or petechia.  I appreciate pharmacy helping [place] out with the heparin.  His creatinine is 1.32. This is improving.  Platelet  count 261.  We need to wait for his Coumadin to become therapeutic. I still would want to avoid it Lovenox for fear of hematuria recurrent. We can control his anticoagulation much better with heparin. I realize it's more of a hassle as Mr. Dusza needs to be in the hospital. However, it is safer for him.  [name]  1 Cor 13:13

## 2013-05-09 NOTE — Progress Notes (Addendum)
Patient ambulated around the entire west side using the four wheel walker. Patient tolerated very well.

## 2013-05-09 NOTE — Progress Notes (Addendum)
ANTICOAGULATION CONSULT NOTE - Follow Up Consult  Pharmacy Consult for Heparin / Warfarin Indication: DVT  Allergies  Allergen Reactions  . Penicillins Other (See Comments)    HIVES     Patient Measurements: Height: 6\' 2"  (188 cm) Weight: 280 lb 13.9 oz (127.4 kg) IBW/kg (Calculated) : 82.2 Heparin Dosing Weight:   Vital Signs: Temp: 98.4 F (36.9 C) (02/25 0456) Temp src: Oral (02/25 0456) BP: 111/70 mmHg (02/25 0456) Pulse Rate: 83 (02/25 0456)  Labs:  Recent Labs  05/07/13 2307 05/08/13 0415 05/09/13 0447  HGB  --  11.4* 10.9*  HCT  --  35.1* 33.8*  PLT  --  224 261  LABPROT  --   --  15.4*  INR  --   --  1.25  HEPARINUNFRC 0.34 0.44 0.63  CREATININE  --   --  1.32    Estimated Creatinine Clearance: 62.3 ml/min (by C-G formula based on Cr of 1.32).   Medications:  Infusions:  . heparin 1,750 Units/hr (05/08/13 1927)    Assessment: 20 yoM with PMHx HTN, BPH, hx prostate cancer and radiation, underwent left percutaneous nephrolithotomy and then found to have LLE DVT with propagation.  Pt initially started on lovenox and warfarn which was subsequently held due to hematuria.  IVC filter placed 2/22 and pt has been started on IV heparin per oncology recommendations.  Tentative plan to start warfarin today.   2/25 Heparin level = 0.63, trending up but remains therapeutic.  CBC stable.  No bleeding or infusion issues noted.  Heparin dosing weight = 110kg.    Goal of Therapy:  Heparin level 0.3-0.7 units/ml Monitor platelets by anticoagulation protocol: Yes   Plan:  Reduce heparin to 1650 units/hr.  Await orders for coumadin.  F/u confirmation heparin level in 8 hours.    ADDENDUM: Coumadin consult placed.  D#1 coumadin / heparin bridge.  Baseline INR 1.25.  Begin coumadin 7.5mg  po x 1 today.   Noted plans for pt to stay hospitalized until INR therapeutic (goal 2.5 per Dr. Jonette Eva).    Ralene Bathe, PharmD, BCPS 05/09/2013, 7:25 AM  Pager: [phone]

## 2013-05-09 NOTE — Progress Notes (Signed)
Patient ID: [name], male   DOB: 04-15-1929, 80 y.o.   MRN: [id number] 6 Days Post-Op  Subjective: [name] is doing well without new complaints.   He denies hematuria.  He walked yesterday evening.  ROS: Negative except he has persistent swelling in the left leg.  Objective: Vital signs in last 24 hours: Temp:  [98.3 F (36.8 C)-98.4 F (36.9 C)] 98.4 F (36.9 C) (02/25 0456) Pulse Rate:  [77-85] 83 (02/25 0456) Resp:  [18] 18 (02/25 0456) BP: (110-124)/(67-72) 111/70 mmHg (02/25 0456) SpO2:  [92 %-95 %] 92 % (02/25 0456)  Intake/Output from previous day: 02/24 0701 - 02/25 0700 In: 1122.5 [P.O.:720; I.V.:402.5] Out: 1425 [Urine:1425] Intake/Output this shift:    General appearance: alert and no distress Resp: clear to auscultation bilaterally Cardio: regular rate and rhythm GI: soft, non-tender; bowel sounds normal; no masses,  no organomegaly Extremities: left leg remains somewhat swollen.  the right has no edema.   Lab Results:   Recent Labs  05/08/13 0415 05/09/13 0447  WBC 8.1 8.3  HGB 11.4* 10.9*  HCT 35.1* 33.8*  PLT 224 261   BMET  Recent Labs  05/09/13 0447  NA 137  K 4.4  CL 101  CO2 26  GLUCOSE 96  BUN 24*  CREATININE 1.32  CALCIUM 9.1   PT/INR  Recent Labs  05/09/13 0447  LABPROT 15.4*  INR 1.25   ABG No results found for this basename: PHART, PCO2, PO2, HCO3,  in the last 72 hours  Studies/Results: No results found.  Anti-infectives: Anti-infectives   Start     Dose/Rate Route Frequency Ordered Stop   05/03/13 0600  cefTRIAXone (ROCEPHIN) 2 g in dextrose 5 % 50 mL IVPB     2 g 100 mL/hr over 30 Minutes Intravenous 30 min pre-op 05/02/13 0902 05/03/13 1112   04/28/13 1200  cefTRIAXone (ROCEPHIN) 1 g in dextrose 5 % 50 mL IVPB  Status:  Discontinued     1 g 100 mL/hr over 30 Minutes Intravenous Every 24 hours 04/27/13 1040 05/04/13 0731   04/26/13 1800  tobramycin (NEBCIN) 700 mg in dextrose 5 % 100 mL IVPB  Status:   Discontinued     700 mg 117.5 mL/hr over 60 Minutes Intravenous Every 24 hours 04/26/13 1731 04/27/13 0710   04/26/13 0853  ciprofloxacin (CIPRO) 400 MG/200ML IVPB    Comments:  [name]   : cabinet override      04/26/13 0853 04/26/13 0943   04/26/13 0730  ciprofloxacin (CIPRO) IVPB 400 mg     400 mg 200 mL/hr over 60 Minutes Intravenous On call 04/25/13 1423 04/26/13 1029   04/25/13 1730  [MAR Hold]  tobramycin (NEBCIN) 700 mg in dextrose 5 % 100 mL IVPB  Status:  Discontinued     (On MAR Hold since 04/26/13 1014)   700 mg 117.5 mL/hr over 60 Minutes Intravenous Every 24 hours 04/25/13 1637 04/26/13 1637   04/25/13 1415  tobramycin (NEBCIN) 60 mg in dextrose 5 % 50 mL IVPB  Status:  Discontinued    Comments:  PER PHARMACY DOSING   60 mg 103 mL/hr over 30 Minutes Intravenous  Once 04/25/13 1409 04/25/13 1419      Current Facility-Administered Medications  Medication Dose Route Frequency Provider Last Rate Last Dose  . acetaminophen (TYLENOL) tablet 650 mg  650 mg Oral Q4H PRN Elijah Seal, MD   650 mg at 04/28/13 2127  . atorvastatin (LIPITOR) tablet 40 mg  40 mg Oral q1800  Elijah Seal, MD   40 mg at 05/08/13 1737  . bisacodyl (DULCOLAX) suppository 10 mg  10 mg Rectal Daily PRN Elijah Seal, MD   10 mg at 05/04/13 1402  . diphenhydrAMINE (BENADRYL) injection 12.5 mg  12.5 mg Intravenous Q6H PRN Elijah Seal, MD       Or  . diphenhydrAMINE (BENADRYL) 12.5 MG/5ML elixir 12.5 mg  12.5 mg Oral Q6H PRN Elijah Seal, MD   12.5 mg at 04/30/13 1828  . docusate sodium (COLACE) capsule 100 mg  100 mg Oral BID Elijah Seal, MD   100 mg at 05/08/13 2228  . doxazosin (CARDURA) tablet 8 mg  8 mg Oral QHS Elijah Seal, MD   8 mg at 05/08/13 2228  . fesoterodine (TOVIAZ) tablet 8 mg  8 mg Oral Daily Elijah Seal, MD   8 mg at 05/08/13 1047  . heparin ADULT infusion 100 units/mL (25000 units/250 mL)  1,650 Units/hr Intravenous Continuous [name], RPH 16.5 mL/hr at 05/09/13 0800 1,650 Units/hr at  05/09/13 0800  . HYDROmorphone (DILAUDID) injection 0.5-1 mg  0.5-1 mg Intravenous Q2H PRN Elijah Seal, MD   1 mg at 05/04/13 2100  . ondansetron (ZOFRAN) injection 4 mg  4 mg Intravenous Q4H PRN Elijah Seal, MD   4 mg at 05/04/13 2100  . oxyCODONE-acetaminophen (PERCOCET/ROXICET) 5-325 MG per tablet 1-2 tablet  1-2 tablet Oral Q4H PRN Elijah Seal, MD   2 tablet at 05/08/13 1132  . traMADol (ULTRAM) tablet 50 mg  50 mg Oral QID PRN Elijah Seal, MD   50 mg at 05/04/13 1943  . warfarin (COUMADIN) tablet 7.5 mg  7.5 mg Oral ONCE-1800 [name], RPH      . Warfarin - Pharmacist Dosing Inpatient   Does not apply q1800 [name], [facility]       Labs and Dr. Antonieta [name] note reviewed.   Assessment: s/p Procedure(s): 2ND STAGE LEFT PERCUTANEOUS NEPHROLITHOTOMY   Left DVT now doing well post filter placement on heparin with compression hose in place.   Plan: Continue anticoagulation per [name]. Continue PT.  D/C when ok with [name].      LOS: 14 days    [name] 05/09/2013

## 2013-05-10 LAB — URINE MICROSCOPIC-ADD ON

## 2013-05-10 LAB — CBC
HCT: 33.7 % — ABNORMAL LOW (ref 39.0–52.0)
Hemoglobin: 11.1 g/dL — ABNORMAL LOW (ref 13.0–17.0)
MCH: 28 pg (ref 26.0–34.0)
MCHC: 32.9 g/dL (ref 30.0–36.0)
MCV: 84.9 fL (ref 78.0–100.0)
PLATELETS: 256 10*3/uL (ref 150–400)
RBC: 3.97 MIL/uL — AB (ref 4.22–5.81)
RDW: 14.4 % (ref 11.5–15.5)
WBC: 7.7 10*3/uL (ref 4.0–10.5)

## 2013-05-10 LAB — URINALYSIS, ROUTINE W REFLEX MICROSCOPIC
Bilirubin Urine: NEGATIVE
Glucose, UA: NEGATIVE mg/dL
KETONES UR: NEGATIVE mg/dL
Nitrite: NEGATIVE
Protein, ur: 30 mg/dL — AB
Specific Gravity, Urine: 1.017 (ref 1.005–1.030)
UROBILINOGEN UA: 1 mg/dL (ref 0.0–1.0)
pH: 6 (ref 5.0–8.0)

## 2013-05-10 LAB — HEPARIN LEVEL (UNFRACTIONATED)
Heparin Unfractionated: 0.41 IU/mL (ref 0.30–0.70)
Heparin Unfractionated: 0.38 IU/mL (ref 0.30–0.70)

## 2013-05-10 LAB — PROTIME-INR
INR: 1.23 (ref 0.00–1.49)
Prothrombin Time: 15.2 seconds (ref 11.6–15.2)

## 2013-05-10 MED ORDER — WARFARIN SODIUM 7.5 MG PO TABS
7.5000 mg | ORAL_TABLET | Freq: Once | ORAL | Status: AC
  Administered 2013-05-10: 7.5 mg via ORAL
  Filled 2013-05-10: qty 1

## 2013-05-10 NOTE — Progress Notes (Signed)
PROGRESS NOTE  [name] DOB: [birth year] DOA: 04/25/2013 PCP: [name], MD  Assessment/Plan: DVT left lower extremity  - IVC filter placed 05/06/2013  - Dr. Jonette Eva consulted for further recommendations, we appreciate input  - recommendation is to start anticoagulation wit Heparin despite IVC filter due to risk of post phlebitic syndrome, also place compression stocking to the left leg  - recommended 6 months of anticoagulation  - continue heparin, started coumadin 2/25, monitor INR daily per pharmacy Renal stone  - status post left percutaneous nephrolithotomy done 2/19  - management per GU  Dyslipidemia  - Continue statin therapy  Acute renal failure  - Cr now WNL, repeat BMP in AM   Antibiotics - none  HPI/Subjective: - feeling better  Objective: Filed Vitals:   05/09/13 1351 05/09/13 2031 05/10/13 0530 05/10/13 1355  BP: 123/78 135/83 143/88 125/81  Pulse: 81 77 81 77  Temp: 98.4 F (36.9 C) 98.6 F (37 C) 98 F (36.7 C) 98.7 F (37.1 C)  TempSrc: Oral Oral Oral Oral  Resp: 18 20 20 18   Height:      Weight:      SpO2: 94% 94% 96% 95%    Intake/Output Summary (Last 24 hours) at 05/10/13 1709 Last data filed at 05/10/13 1650  Gross per 24 hour  Intake 942.95 ml  Output   1575 ml  Net -632.05 ml   Filed Weights   04/25/13 1422 04/26/13 1800 04/27/13 0400  Weight: 127.914 kg (282 lb) 126.6 kg (279 lb 1.6 oz) 127.4 kg (280 lb 13.9 oz)    Exam:   General:  NAD  Cardiovascular: regular rate and rhythm, without MRG  Respiratory: good air movement, clear to auscultation throughout, no wheezing, ronchi or rales  Abdomen: soft, not tender to palpation, positive bowel sounds  MSK: LLE swelling, palpable pulses  Neuro: non focal  Data Reviewed: Basic Metabolic Panel:  Recent Labs Lab 05/05/13 0353 05/06/13 0437 05/09/13 0447  NA 136* 139 137  K 4.7 4.6 4.4  CL 100 102 101  CO2 24 28 26   GLUCOSE 120* 89 96  BUN 25*  26* 24*  CREATININE 1.43* 1.35 1.32  CALCIUM 8.8 8.9 9.1   CBC:  Recent Labs Lab 05/04/13 0415 05/05/13 0353 05/06/13 0437 05/08/13 0415 05/09/13 0447 05/10/13 0343  WBC 10.0  --   --  8.1 8.3 7.7  HGB 12.0* 11.2* 10.6* 11.4* 10.9* 11.1*  HCT 37.2* 35.0* 32.9* 35.1* 33.8* 33.7*  MCV 85.3  --   --  85.0 84.9 84.9  PLT 162  --   --  224 261 256   Studies: No results found.  Scheduled Meds: . atorvastatin  40 mg Oral q1800  . docusate sodium  100 mg Oral BID  . doxazosin  8 mg Oral QHS  . fesoterodine  8 mg Oral Daily  . polyethylene glycol  17 g Oral BID  . warfarin  7.5 mg Oral ONCE-1800  . Warfarin - Pharmacist Dosing Inpatient   Does not apply q1800   Continuous Infusions: . heparin 1,550 Units/hr (05/10/13 1544)    Principal Problem:   DVT, lower extremity Active Problems:   Renal stone   Junctional cardiac arrhythmia   Severe sinus bradycardia   Infection of urinary tract   Thrombocytopenia, unspecified   Gross hematuria   Time spent: 15  This note has been created with Surveyor, quantity. Any transcriptional errors are unintentional.   Marzetta Board, MD  [facility] Pager [phone]. If 7 PM - 7 AM, please contact night-coverage at [email or web address], password [facility] 05/10/2013, 5:09 PM  LOS: 15 days

## 2013-05-10 NOTE — Progress Notes (Signed)
Physical Therapy Treatment Patient Details Name: [name] MRN: [id number] DOB: 12-06-1929 Today's Date: 05/10/2013 Time: 0935-1000 PT Time Calculation (min): 25 min  PT Assessment / Plan / Recommendation  History of Present Illness [name] returns today in f/u.  He has a history of a 3.77mm right renal stone and a 1.4cm LLP stone with microhematuria.  He thinks he passed a stone on the left about a month ago.   He has had no flank pain or gross hematuria.   he has no dysuria but he does have some urgency and UUI.   He has a history of a UTI in 2/14 and had another in 7/14 that was treated with fosfomycin but he doesn't think that helped, and his UA today looks infected.  He has a history of prostate cancer treated in 2008 . Pt is S/P  L percutaneous Nephrolithotomy on 04/27/13.   PT Comments   Pt progressing slowly and still requiring EVA walker to amb due to weakness.  Pt will need ST Rehab at SNF prior to D/c to home.   Follow Up Recommendations  SNF     Does the patient have the potential to tolerate intense rehabilitation     Barriers to Discharge        Equipment Recommendations       Recommendations for Other Services    Frequency Min 3X/week   Progress towards PT Goals Progress towards PT goals: Progressing toward goals  Plan      Precautions / Restrictions Precautions Precautions: Fall Restrictions Weight Bearing Restrictions: No   Pertinent Vitals/Pain     Mobility  Bed Mobility Overal bed mobility: Needs Assistance Bed Mobility: Supine to Sit;Sit to Supine Supine to sit: Supervision Sit to supine: Supervision General bed mobility comments: increased time and use of rails Transfers Overall transfer level: Needs assistance Equipment used:  (EVA walker) Transfers: Sit to/from Stand Sit to Stand: Min guard;Min assist General transfer comment: Pt more able to transition from sit to stand.  Was able to get off raied toilet at [facility] assist.    Ambulation/Gait Ambulation/Gait assistance: Min assist;Min guard Ambulation Distance (Feet): 400 Feet Assistive device:  (EVA walker) Gait Pattern/deviations: Step-through pattern Gait velocity: decreased General Gait Details: Used EVA walker for increased support.  Pt tolerating a great distance.  Amb in hallway then assist to BR before returning back to bed.     PT Goals (current goals can now be found in the care plan section)    Visit Information  Last PT Received On: 05/10/13 Assistance Needed: +1 History of Present Illness: [name] returns today in f/u.  He has a history of a 3.23mm right renal stone and a 1.4cm LLP stone with microhematuria.  He thinks he passed a stone on the left about a month ago.   He has had no flank pain or gross hematuria.   he has no dysuria but he does have some urgency and UUI.   He has a history of a UTI in 2/14 and had another in 7/14 that was treated with fosfomycin but he doesn't think that helped, and his UA today looks infected.  He has a history of prostate cancer treated in 2008 . Pt is S/P  L percutaneous Nephrolithotomy on 04/27/13.    Subjective Data      Cognition       Balance     End of Session PT - End of Session Equipment Utilized During Treatment: Gait belt Activity Tolerance: Patient  tolerated treatment well Patient left: in bed;with call bell/phone within reach   Rica Koyanagi  PTA [facility]  Acute  Rehab Pager      [phone]

## 2013-05-10 NOTE — Progress Notes (Signed)
ANTICOAGULATION CONSULT NOTE - Follow Up Consult  Pharmacy Consult for Heparin / Warfarin Indication: DVT  Allergies  Allergen Reactions  . Penicillins Other (See Comments)    HIVES     Patient Measurements: Height: 6\' 2"  (188 cm) Weight: 280 lb 13.9 oz (127.4 kg) IBW/kg (Calculated) : 82.2 Heparin Dosing Weight:   Vital Signs: Temp: 98 F (36.7 C) (02/26 0530) Temp src: Oral (02/26 0530) BP: 143/88 mmHg (02/26 0530) Pulse Rate: 81 (02/26 0530)  Labs:  Recent Labs  05/08/13 0415 05/09/13 0447 05/09/13 1535 05/10/13 0343 05/10/13 1256  HGB 11.4* 10.9*  --  11.1*  --   HCT 35.1* 33.8*  --  33.7*  --   PLT 224 261  --  256  --   LABPROT  --  15.4*  --  15.2  --   INR  --  1.25  --  1.23  --   HEPARINUNFRC 0.44 0.63 0.62 0.41 0.38  CREATININE  --  1.32  --   --   --     Estimated Creatinine Clearance: 62.3 ml/min (by C-G formula based on Cr of 1.32).   Medications:  Infusions:  . heparin 1,550 Units/hr (05/10/13 0124)    Assessment: 14 yoM with PMHx HTN, BPH, hx prostate cancer and radiation, underwent left percutaneous nephrolithotomy and then found to have LLE DVT with propagation.  Pt initially started on lovenox and warfarn which was subsequently held due to hematuria.  IVC filter placed 2/22 and pt has been started on IV heparin per oncology recommendations. Warfarin bridge began 2/25.    D#2 Warfarin / Heparin bridge 2/26 AM heparin level = 0.41, therapeutic after decreasing infusion rate 2x yesterday.  INR = 1.23, no change as expected after only 1 dose warfarin.  Per RN, no further bleeding from left flank where nephrostomy tube was removed.  CBC stable.   Heparin dosing weight = 110kg.    Goal of Therapy:  Heparin level 0.3-0.7 units/ml Monitor platelets by anticoagulation protocol: Yes   Plan:  Continue heparin at 1550 units/hr.  Recheck confirmation heparin level in 8 hours.  Repeat coumadin 7.5mg  po x 1 tonight at 1800.  Heparin level, PT/INR  and CBC in AM Monitor for bleeding  Thank you for the consult.  Ralene Bathe, PharmD, BCPS 05/10/2013, 1:28 PM  Pager: [phone]   ADDENDUM:   Confirmation HL is also therapeutic.  No issues per RN.  Continue heparin at current rate, check next heparin level with AM labs.    Ralene Bathe, PharmD, BCPS 05/10/2013, 1:29 PM  Pager: [phone]

## 2013-05-10 NOTE — Progress Notes (Signed)
ANTICOAGULATION CONSULT NOTE - Follow Up Consult  Pharmacy Consult for Heparin / Warfarin Indication: DVT  Allergies  Allergen Reactions  . Penicillins Other (See Comments)    HIVES     Patient Measurements: Height: 6\' 2"  (188 cm) Weight: 280 lb 13.9 oz (127.4 kg) IBW/kg (Calculated) : 82.2 Heparin Dosing Weight:   Vital Signs: Temp: 98 F (36.7 C) (02/26 0530) Temp src: Oral (02/26 0530) BP: 143/88 mmHg (02/26 0530) Pulse Rate: 81 (02/26 0530)  Labs:  Recent Labs  05/08/13 0415 05/09/13 0447 05/09/13 1535 05/10/13 0343  HGB 11.4* 10.9*  --  11.1*  HCT 35.1* 33.8*  --  33.7*  PLT 224 261  --  256  LABPROT  --  15.4*  --  15.2  INR  --  1.25  --  1.23  HEPARINUNFRC 0.44 0.63 0.62 0.41  CREATININE  --  1.32  --   --     Estimated Creatinine Clearance: 62.3 ml/min (by C-G formula based on Cr of 1.32).   Medications:  Infusions:  . heparin 1,550 Units/hr (05/10/13 0124)    Assessment: 86 yoM with PMHx HTN, BPH, hx prostate cancer and radiation, underwent left percutaneous nephrolithotomy and then found to have LLE DVT with propagation.  Pt initially started on lovenox and warfarn which was subsequently held due to hematuria.  IVC filter placed 2/22 and pt has been started on IV heparin per oncology recommendations. Warfarin bridge began 2/25.    D#2 Warfarin / Heparin bridge 2/26 AM heparin level = 0.41, therapeutic after decreasing infusion rate 2x yesterday.  INR = 1.23, no change as expected after only 1 dose warfarin.  Per RN, no further bleeding from left flank where nephrostomy tube was removed.  CBC stable.   Heparin dosing weight = 110kg.    Goal of Therapy:  Heparin level 0.3-0.7 units/ml Monitor platelets by anticoagulation protocol: Yes   Plan:  Continue heparin at 1550 units/hr.  Recheck confirmation heparin level in 8 hours.  Repeat coumadin 7.5mg  po x 1 tonight at 1800.  Heparin level, PT/INR and CBC in AM Monitor for bleeding  Thank you for  the consult.  Ralene Bathe, PharmD, BCPS 05/10/2013, 7:58 AM  Pager: [phone]

## 2013-05-10 NOTE — Progress Notes (Signed)
Physical Therapy Treatment Patient Details Name: [name] MRN: [id number] DOB: [birth year] Today's Date: 05/10/2013 Time: 1450-1505 PT Time Calculation (min): 15 min  PT Assessment / Plan / Recommendation  History of Present Illness [name] returns today in f/u.  He has a history of a 3.50mm right renal stone and a 1.4cm LLP stone with microhematuria.  He thinks he passed a stone on the left about a month ago.   He has had no flank pain or gross hematuria.   he has no dysuria but he does have some urgency and UUI.   He has a history of a UTI in 2/14 and had another in 7/14 that was treated with fosfomycin but he doesn't think that helped, and his UA today looks infected.  He has a history of prostate cancer treated in 2008 . Pt is S/P  L percutaneous Nephrolithotomy on 04/27/13.   PT Comments   Amb pt second time per pt request.  Follow Up Recommendations  SNF     Does the patient have the potential to tolerate intense rehabilitation     Barriers to Discharge        Equipment Recommendations       Recommendations for Other Services    Frequency Min 3X/week   Progress towards PT Goals Progress towards PT goals: Progressing toward goals  Plan      Precautions / Restrictions Precautions Precautions: Fall Restrictions Weight Bearing Restrictions: No   Pertinent Vitals/Pain     Mobility  Bed Mobility Overal bed mobility: Needs Assistance Bed Mobility: Supine to Sit;Sit to Supine Supine to sit: Supervision Sit to supine: Supervision General bed mobility comments: increased time and use of rails Transfers Overall transfer level: Needs assistance Equipment used:  (EVA walker) Transfers: Sit to/from Stand Sit to Stand: Min guard;Min assist General transfer comment: Pt more able to transition from sit to stand.  Was able to get off raied toilet at [facility] assist.   Ambulation/Gait Ambulation/Gait assistance: Min assist;Min guard Ambulation Distance (Feet): 400  Feet Assistive device:  (EVA walker) Gait Pattern/deviations: Step-through pattern Gait velocity: decreased General Gait Details: Used EVA walker for increased support.  Pt tolerating a great distance.  Amb in hallway then assist to BR before returning back to bed.     PT Goals (current goals can now be found in the care plan section)    Visit Information  Last PT Received On: 05/10/13 Assistance Needed: +1 History of Present Illness: [name] returns today in f/u.  He has a history of a 3.51mm right renal stone and a 1.4cm LLP stone with microhematuria.  He thinks he passed a stone on the left about a month ago.   He has had no flank pain or gross hematuria.   he has no dysuria but he does have some urgency and UUI.   He has a history of a UTI in 2/14 and had another in 7/14 that was treated with fosfomycin but he doesn't think that helped, and his UA today looks infected.  He has a history of prostate cancer treated in 2008 . Pt is S/P  L percutaneous Nephrolithotomy on 04/27/13.    Subjective Data      Cognition       Balance     End of Session PT - End of Session Equipment Utilized During Treatment: Gait belt Activity Tolerance: Patient tolerated treatment well Patient left: in bed;with call bell/phone within reach   Rica Koyanagi  PTA [facility]  Acute  Rehab Pager      [phone]

## 2013-05-10 NOTE — Progress Notes (Signed)
[name] is improving slowly but surely. his left leg is not swollen. He continues on heparin. He had some bleeding when he had a nephrostomy tube removed. Since then, has been no bleeding. He has no hematuria. He's been able anymore.  His appetite is okay. There is no abdominal pain. He's had no nausea.  On physical, his vital signs are stable. His temperature is 90.8. Pulse 81. Blood pressure 143/88. Lungs are clear. Cardiac exam regular rate and rhythm. He is a 1/6 systolic murmur. Abdomen is soft. Has good bowel sounds. There is no fluid wave. Extremities shows mild nonpitting edema of the left leg. No venous cord is noted. No discoloration of the leg is noted. Right leg is unremarkable.  His labs show his hemoglobin to be 11.1. This is stable. Platelet count 256. His INR is 1.23.  Pharmacy is managing his anticoagulation.  He needs to be an inpatient until he is therapeutic on Coumadin.  Am glad to see that he is ambulating well.  He does have the IVC filter in place. I feel that he does need a course of therapeutic anticoagulation so that he does not develop postphlebitic syndrome of his left leg.  He will need a measured compression stocking for the left leg. Occupational therapy can do this. They may have already done this week to be awaiting the manufacture of his stocking.  We will continue to follow along.

## 2013-05-10 NOTE — Progress Notes (Signed)
7 Days Post-Op Subjective: Patient reports that he is doing well.   He has no hematuria or voiding complaints.   He has been ambulating and reports improving strength.   His INR is still only 1.23.  ROS: He denies fever or flank pain.   Objective: Vital signs in last 24 hours: Temp:  [98 F (36.7 C)-98.6 F (37 C)] 98 F (36.7 C) (02/26 0530) Pulse Rate:  [77-81] 81 (02/26 0530) Resp:  [18-20] 20 (02/26 0530) BP: (123-143)/(78-88) 143/88 mmHg (02/26 0530) SpO2:  [94 %-96 %] 96 % (02/26 0530)  Intake/Output from previous day: 02/25 0701 - 02/26 0700 In: 1405.5 [P.O.:1020; I.V.:385.5] Out: 950 [Urine:950] Intake/Output this shift:    Physical Exam:  General:alert and no distress GI: not done and soft, non tender, normal bowel sounds, no palpable masses, no organomegaly, no inguinal hernia  Resp: clear to auscultation bilaterally Cardio: regular rate and rhythm Extremities: He has stable swelling of the LLE with no calf tenderness left or right.   Compression hose in place.   Lab Results:  Recent Labs  05/08/13 0415 05/09/13 0447 05/10/13 0343  HGB 11.4* 10.9* 11.1*  HCT 35.1* 33.8* 33.7*   BMET  Recent Labs  05/09/13 0447  NA 137  K 4.4  CL 101  CO2 26  GLUCOSE 96  BUN 24*  CREATININE 1.32  CALCIUM 9.1    Recent Labs  05/09/13 0447 05/10/13 0343  INR 1.25 1.23   No results found for this basename: LABURIN,  in the last 72 hours Results for orders placed during the hospital encounter of 07/18/12  SURGICAL PCR SCREEN     Status: None   Collection Time    07/18/12  1:05 PM      Result Value Ref Range Status   MRSA, PCR NEGATIVE  NEGATIVE Final   Staphylococcus aureus NEGATIVE  NEGATIVE Final   Comment:            The Xpert SA Assay (FDA     approved for NASAL specimens     in patients over 65 years of age),     is one component of     a comprehensive surveillance     program.  Test performance has     been validated by [employer] for  patients greater     than or equal to 53 year old.     It is not intended     to diagnose infection nor to     guide or monitor treatment.    Studies/Results: No results found.  Assessment/Plan: 7 Days Post-Op Procedure(s) (LRB): 2ND STAGE LEFT PERCUTANEOUS NEPHROLITHOTOMY  (Left)  Left DVT on heparin drip and coumadin.   Still not therapeutic.   He has no bleeding issues.  Deconditioning.  He is regaining strength and is ambulating.   D/C is pending a therapeutic coumadin level.     LOS: 15 days   Daxton Nydam J 05/10/2013, 10:55 AM

## 2013-05-11 LAB — CBC
HCT: 33.9 % — ABNORMAL LOW (ref 39.0–52.0)
Hemoglobin: 11 g/dL — ABNORMAL LOW (ref 13.0–17.0)
MCH: 27.6 pg (ref 26.0–34.0)
MCHC: 32.4 g/dL (ref 30.0–36.0)
MCV: 85 fL (ref 78.0–100.0)
PLATELETS: 266 10*3/uL (ref 150–400)
RBC: 3.99 MIL/uL — AB (ref 4.22–5.81)
RDW: 14.3 % (ref 11.5–15.5)
WBC: 6.9 10*3/uL (ref 4.0–10.5)

## 2013-05-11 LAB — PROTIME-INR
INR: 1.38 (ref 0.00–1.49)
Prothrombin Time: 16.6 seconds — ABNORMAL HIGH (ref 11.6–15.2)

## 2013-05-11 LAB — HEPARIN LEVEL (UNFRACTIONATED)
Heparin Unfractionated: 0.31 IU/mL (ref 0.30–0.70)
Heparin Unfractionated: 0.42 IU/mL (ref 0.30–0.70)

## 2013-05-11 MED ORDER — WARFARIN SODIUM 10 MG PO TABS
10.0000 mg | ORAL_TABLET | Freq: Once | ORAL | Status: AC
  Administered 2013-05-11: 10 mg via ORAL
  Filled 2013-05-11: qty 1

## 2013-05-11 MED ORDER — HEPARIN (PORCINE) IN NACL 100-0.45 UNIT/ML-% IJ SOLN
1650.0000 [IU]/h | INTRAMUSCULAR | Status: DC
  Administered 2013-05-11 – 2013-05-13 (×5): 1650 [IU]/h via INTRAVENOUS
  Filled 2013-05-11 (×6): qty 250

## 2013-05-11 NOTE — Progress Notes (Signed)
ANTICOAGULATION CONSULT NOTE - Follow Up Consult  Pharmacy Consult for Heparin / Warfarin Indication: DVT  Allergies  Allergen Reactions  . Penicillins Other (See Comments)    HIVES     Patient Measurements: Height: 6\' 2"  (188 cm) Weight: 280 lb 13.9 oz (127.4 kg) IBW/kg (Calculated) : 82.2 Heparin Dosing Weight:   Vital Signs: Temp: 98.5 F (36.9 C) (02/27 0600) Temp src: Oral (02/27 0600) BP: 133/68 mmHg (02/27 0600) Pulse Rate: 79 (02/27 0600)  Labs:  Recent Labs  05/09/13 0447  05/10/13 0343 05/10/13 1256 05/11/13 0419  HGB 10.9*  --  11.1*  --  11.0*  HCT 33.8*  --  33.7*  --  33.9*  PLT 261  --  256  --  266  LABPROT 15.4*  --  15.2  --  16.6*  INR 1.25  --  1.23  --  1.38  HEPARINUNFRC 0.63  < > 0.41 0.38 0.31  CREATININE 1.32  --   --   --   --   < > = values in this interval not displayed.  Estimated Creatinine Clearance: 62.3 ml/min (by C-G formula based on Cr of 1.32).   Medications:  Infusions:  . heparin 1,550 Units/hr (05/10/13 1544)    Assessment: 19 yoM with PMHx HTN, BPH, hx prostate cancer and radiation, underwent left percutaneous nephrolithotomy and then found to have LLE DVT with propagation.  Pt initially started on lovenox and warfarn which was subsequently held due to hematuria.  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  Also has compression stocking on left leg.   2/27: D3 warfarin / heparin bridge.   AM heparin level = 0.31, remains therapeutic but trending downward.   INR = 1.38, slight increase.   CBC stable.  No bleeding or infusion site issues noted.  Heparin dosing weight = 110kg.    Goal of Therapy:  Heparin level 0.3-0.7 units/ml Monitor platelets by anticoagulation protocol: Yes   Plan:  Increase heparin to 1650 units/hr.  Recheck confirmation heparin level in 8 hours.  Coumadin 10mg  po x 1 tonight at 1800.  Heparin level, PT/INR and CBC in  AM Monitor for bleeding  Thank you for the consult.  Ralene Bathe, PharmD, BCPS 05/11/2013, 7:03 AM  Pager: [phone]   ADDENDUM:   Confirmation HL is also therapeutic.  No issues per RN.  Continue heparin at current rate, check next heparin level with AM labs.    Ralene Bathe, PharmD, BCPS 05/11/2013, 7:03 AM  Pager: [phone]

## 2013-05-11 NOTE — Progress Notes (Signed)
ANTICOAGULATION CONSULT NOTE - Follow Up Consult  Pharmacy Consult for Heparin / Warfarin Indication: DVT  Allergies  Allergen Reactions  . Penicillins Other (See Comments)    HIVES    Labs:  Recent Labs  05/09/13 0447  05/10/13 0343 05/10/13 1256 05/11/13 0419 05/11/13 1614  HGB 10.9*  --  11.1*  --  11.0*  --   HCT 33.8*  --  33.7*  --  33.9*  --   PLT 261  --  256  --  266  --   LABPROT 15.4*  --  15.2  --  16.6*  --   INR 1.25  --  1.23  --  1.38  --   HEPARINUNFRC 0.63  < > 0.41 0.38 0.31 0.42  CREATININE 1.32  --   --   --   --   --   < > = values in this interval not displayed.  Estimated Creatinine Clearance: 62.3 ml/min (by C-G formula based on Cr of 1.32).   Infusions:  . heparin 1,650 Units/hr (05/11/13 0801)     Assessment:  2/27, D#3 warfarin / heparin bridge.    Confirmatory Heparin level = 0.42, remains in therapeutic range after dose increase this morning.   RN reports no issues with heparin drip and no complications or bleeding.   Goal of Therapy:  Heparin level 0.3-0.7 units/ml Monitor platelets by anticoagulation protocol: Yes   Plan:   Continue heparin IV infusion at 1650 units/hr (16.5 ml/hr)  Daily heparin level, INR, and CBC  Continue to monitor H&H and platelets  Warfarin as previously ordered.  Thank you for the consult.  Gretta Arab PharmD, BCPS Pager [phone] 05/11/2013 5:41 PM

## 2013-05-11 NOTE — Progress Notes (Signed)
[facility]  [phone]) [phone]    HOSPITAL PROGRESS NOTE  Events since 2/26   Noted. 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 responding well. INR today 1.37 No lower extremity swelling. Ambulating without difficulty. .No bleeding issues reported today.No hematuria, voiding without problems.His appetite is okay. There is no abdominal pain. He's had no nausea.     MEDICATIONS:  Scheduled Meds: . atorvastatin  40 mg Oral q1800  . docusate sodium  100 mg Oral BID  . doxazosin  8 mg Oral QHS  . fesoterodine  8 mg Oral Daily  . polyethylene glycol  17 g Oral BID  . warfarin  10 mg Oral ONCE-1800  . Warfarin - Pharmacist Dosing Inpatient   Does not apply q1800   Continuous Infusions: . heparin 1,650 Units/hr (05/11/13 0801)   PRN Meds:.acetaminophen, bisacodyl, diphenhydrAMINE, diphenhydrAMINE, HYDROmorphone (DILAUDID) injection, ondansetron, oxyCODONE-acetaminophen, traMADol  ALLERGIES:   Allergies  Allergen Reactions  . Penicillins Other (See Comments)    HIVES      PHYSICAL EXAMINATION:   Filed Vitals:   05/11/13 0600  BP: 133/68  Pulse: 79  Temp: 98.5 F (36.9 C)  Resp: 18     Filed Weights   04/25/13 1422 04/26/13 1800 04/27/13 0400  Weight: 282 lb (127.914 kg) 279 lb 1.6 oz (126.6 kg) 280 lb 13.9 oz (127.43 kg)    80 year old white male in no acute distress, alert and oriented x3  General well-developed and well-nourished  HEENT: Normocephalic, atraumatic, PERRLA. Oral cavity without thrush or lesions. Neck supple. no thyromegaly, no cervical or supraclavicular adenopathy  Lungs clear bilaterally . No wheezing, rhonchi or rales. Cardiac: regular rate and rhythm,1/6 murmur , rubs or gallops Abdomen soft nontender , bowel sounds x4. No hepatosplenomegaly. Percutaneous site as per Urology  Extremities no clubbing cyanosis  Trace edema LLE. No  petechial  rash Neuro: non focal  LABORATORY/RADIOLOGY DATA:   Recent Labs Lab 05/06/13 0437 05/08/13 0415 05/09/13 0447 05/10/13 0343 05/11/13 0419  WBC  --  8.1 8.3 7.7 6.9  HGB 10.6* 11.4* 10.9* 11.1* 11.0*  HCT 32.9* 35.1* 33.8* 33.7* 33.9*  PLT  --  224 261 256 266  MCV  --  85.0 84.9 84.9 85.0  MCH  --  27.6 27.4 28.0 27.6  MCHC  --  32.5 32.2 32.9 32.4  RDW  --  14.5 14.4 14.4 14.3    CMP    Recent Labs Lab 05/05/13 0353 05/06/13 0437 05/09/13 0447  NA 136* 139 137  K 4.7 4.6 4.4  CL 100 102 101  CO2 24 28 26   GLUCOSE 120* 89 96  BUN 25* 26* 24*  CREATININE 1.43* 1.35 1.32  CALCIUM 8.8 8.9 9.1        Component Value Date/Time   BILITOT 0.3 07/18/2012 1405    Recent Labs Lab 05/05/13 0353 05/06/13 0437 05/09/13 0447 05/10/13 0343 05/11/13 0419  INR 1.41 1.30 1.25 1.23 1.38      Urinalysis    Component Value Date/Time   COLORURINE AMBER* 05/10/2013 0500   APPEARANCEUR CLOUDY* 05/10/2013 0500   LABSPEC 1.017 05/10/2013 0500   PHURINE 6.0 05/10/2013 0500   GLUCOSEU NEGATIVE 05/10/2013 0500   HGBUR LARGE* 05/10/2013 0500   BILIRUBINUR NEGATIVE 05/10/2013 0500   KETONESUR NEGATIVE 05/10/2013 0500   PROTEINUR 30* 05/10/2013 0500   UROBILINOGEN 1.0 05/10/2013 0500   NITRITE NEGATIVE 05/10/2013 0500   LEUKOCYTESUR  SMALL* 05/10/2013 0500    Radiology Studies:  Ct Abdomen Wo Contrast  04/27/2013   CLINICAL DATA:  Status post lithotripsy, evaluate for residual left renal stones  EXAM: CT ABDOMEN WITHOUT CONTRAST  TECHNIQUE: Multidetector CT imaging of the abdomen was performed following the standard protocol without IV contrast.  COMPARISON:  IR INTRO URET CATH PERC *L* dated 04/26/2013; DG ABDOMEN 1V dated 03/16/2013; CT CTA ABD/PEL W/CM AND/OR W/O CM dated 05/19/2012  FINDINGS: There is perinephric inflammatory change on the left, and there are multiple small foci of retroperitoneal left perinephric air. There is a percutaneous nephrostomy tube in addition to a  percutaneously placed and partially visualized left ureteral stent. There is mild dilatation of the left renal pelvis. There is a 4.5 cm left renal cyst.  There are a few tiny stone fragments in the mildly dilated renal pelvis. There are approximately 3 tiny fragments the largest measuring 2 mm. In the lower pole collecting system, there are for remaining calculi. These measure 4 mm, 6 mm, 4 mm, and 8 mm respectively.  There is coronary arterial calcification. There are small bilateral pleural effusions and bilateral lower lobe consolidation deep dependently. Aspiration is not excluded, although dependent atelectasis may account for the findings. There is a 6 cm abdominal aortic aneurysm at the level of the diaphragm. There is stent bypass repair involving the distal abdominal aorta. No significant change in aortic aneurysm when compared to 2014. Liver, gallbladder, right kidney, pancreas, spleen, and adrenal glands show no acute findings.  IMPRESSION: Multiple residual left renal calculi as described above. Other findings involving the left kidney consistent with post-procedure appearance.  Bilateral lower lobe consolidation.   Electronically Signed   By: Skipper Cliche M.D.   On: 04/27/2013 09:52   Dg Abd 1 View  05/03/2013   CLINICAL DATA:  Left renal stone.  EXAM: ABDOMEN - 1 VIEW; DG C-ARM 1-60 MIN - NRPT [facility]  COMPARISON:  CT scan dated 04/27/2013  FLUOROSCOPY TIME:  0 min 54 seconds  FINDINGS: Multiple C-arm images demonstrate the percutaneous nephrostomy as well as a nephrostogram. According to the operative note the stone was fragmented and extracted. Small filling defect in the distal ureter on image number 5 could represent an air bubble or a small stone fragment.  IMPRESSION: Percutaneous nephrostomy with removal of left renal stone performed by Dr. Jeffie Pollock.   Electronically Signed   By: Rozetta Nunnery M.D.   On: 05/03/2013 13:18   Ir Ivc Filter Plmt / S&i /img Guid/mod Sed  05/06/2013   CLINICAL  DATA:  DVT.  Hematuria.  EXAM: IVC FILTER,INFERIOR VENA CAVOGRAM  MEDICATIONS AND MEDICAL HISTORY: Versed 1 mg, Fentanyl 25 mcg.  Additional Medications: None.  ANESTHESIA/SEDATION: Moderate sedation time: 10 minutes  CONTRAST:  40 cc Omnipaque 300  FLUOROSCOPY TIME:  1.0 min  PROCEDURE: The procedure, risks, benefits, and alternatives were explained to the patient. Questions regarding the procedure were encouraged and answered. The patient understands and consents to the procedure.  The right neck was prepped with Betadine in a sterile fashion, and a sterile drape was applied covering the operative field. A sterile gown and sterile gloves were used for the procedure.  The right internal jugular vein was noted to be patent initially with ultrasound. Under sonographic guidance, a micropuncture needle was inserted into the right internal jugular vein (Ultrasound image documentation was performed). It was removed over an 018 wire which was upsized to a King. The sheath was inserted over the wire and  into the IVC. IVC venography was performed.  The temporary Denali filter was then deployed in the infrarenal IVC. The sheath was removed and hemostasis was achieved with direct pressure.  COMPLICATIONS: None  FINDINGS: IVC venography confirms renal vein inflow at L1 and no venous anomaly.  The image demonstrates placement of an IVC filter with its tip at the L1-2 disc.  IMPRESSION: Successful infrarenal IVC filter placement. This is a temporary filter. It can be removed or remain in place to become permanent.   Electronically Signed   By: Maryclare Bean M.D.   On: 05/06/2013 12:17   Dg C-arm 1-60 Min-no Report  05/03/2013   CLINICAL DATA:  Left renal stone.  EXAM: ABDOMEN - 1 VIEW; DG C-ARM 1-60 MIN - NRPT [facility]  COMPARISON:  CT scan dated 04/27/2013  FLUOROSCOPY TIME:  0 min 54 seconds  FINDINGS: Multiple C-arm images demonstrate the percutaneous nephrostomy as well as a nephrostogram. According to the operative note the  stone was fragmented and extracted. Small filling defect in the distal ureter on image number 5 could represent an air bubble or a small stone fragment.  IMPRESSION: Percutaneous nephrostomy with removal of left renal stone performed by Dr. Jeffie Pollock.   Electronically Signed   By: Rozetta Nunnery M.D.   On: 05/03/2013 13:18   Dg C-arm 1-60 Min-no Report  04/26/2013   CLINICAL DATA: intra op   C-ARM 1-60 MINUTES  Fluoroscopy was utilized by the requesting physician.  No radiographic  interpretation.    Ir Oris Drone Cath Perc Left  04/26/2013   CLINICAL DATA:  Left lower pole calculus , pre percutaneous intraoperative nephrolithotomy  EXAM: LEFT PERCUTANEOUS NEPHRO-URETERAL CATHETER PLACEMENT UNDER FLUOROSCOPIC GUIDANCE  TECHNIQUE: The procedure, risks (including but not limited to bleeding, infection, organ damage ), benefits, and alternatives were explained to the patient. Questions regarding the procedure were encouraged and answered. The patient understands and consents to the procedure. The leftflank region prepped with Betadine, draped in usual sterile fashion, infiltrated locally with 1% lidocaine.As antibiotic prophylaxis, ciprofloxacin 400 mg was ordered pre-procedure and administered intravenously within one hour of incision.  Intravenous Fentanyl and Versed were administered as conscious sedation during continuous cardiorespiratory monitoring by the radiology RN, with a total moderate sedation time of 20 minutes.  Under real-time ultrasound guidance, a 21-gauge trocar needle was advanced into a posterior lower pole calyx using the calculus as a guide. A 018 inch guidewire was advanced into the proximal ureter. Needle exchanged for 3 French dilator allowing contrast injection, confirming appropriate positioning. The dilator was exchanged over the 018 wire for the transitional dilator, which allowed passage of the 035 Bentson wire down the ureter. Over this, a 5 [place] angiographic catheter was advanced into  the urinary bladder. Catheter was capped externally and secured to the skin with an 0 silk suture. The patient tolerated the procedure well. No immediate complication.  FLUOROSCOPY TIME:  5 MIN 0 seconds  IMPRESSION: 1. Technically successful left percutaneous nephro-ureteral catheter placement for planned intraoperative procedure.   Electronically Signed   By: Arne Cleveland M.D.   On: 04/26/2013 15:17       ASSESSMENT AND PLAN:   DVT left lower extremity s/p - IVC filter placed 05/06/2013. 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  recommended 6 months of anticoagulation . He is to be discharged when INR therapeutic.  Other medical issues  as per Urology and Internal Medicine team  [facility] E, PA-C 05/11/2013, 2:29 PM  ADDENDUM:  As above by Clarise Cruz.  He continues on heparin. Doing well. No bleeding. Left leg improving slowly. His diet therapeutic on Coumadin. He is getting up there. His INR is 1.38.  We will continue to follow along and help if necessary.  Harriette Ohara 33:3

## 2013-05-11 NOTE — Progress Notes (Signed)
CSW continues to follow for discharge planning, patient has a bed @ [facility] when stable for discharge. CSW confirmed with Florentina Jenny @ [facility] that they would be able to take patient over the weekend if he's ready.   Weekend CSW, Estill Bamberg (ph#: [phone]) to facilitate discharge, weekend supervisor @ [address] = [place] (ph#: [phone]) Room to call report is [phone].   Clinical Social Work Department CLINICAL SOCIAL WORK PLACEMENT NOTE 05/11/2013  Patient:  [name], [name]  Account Number:  [id number] Admit date:  04/25/2013  Clinical Social Worker:  Renold Genta  Date/time:  05/07/2013 03:25 PM  Clinical Social Work is seeking post-discharge placement for this patient at the following level of care:   SKILLED NURSING   (*CSW will update this form in Epic as items are completed)   05/07/2013  Patient/family provided with [place] Department of Clinical Social Work's list of facilities offering this level of care within the geographic area requested by the patient (or if unable, by the patient's family).  05/07/2013  Patient/family informed of their freedom to choose among providers that offer the needed level of care, that participate in Medicare, Medicaid or managed care program needed by the patient, have an available bed and are willing to accept the patient.  05/07/2013  Patient/family informed of [facility]' ownership interest in [facility], as well as of the fact that they are under no obligation to receive care at this facility.  PASARR submitted to EDS on 05/07/2013 PASARR number received from EDS on 05/07/2013  FL2 transmitted to all facilities in geographic area requested by pt/family on  05/07/2013 FL2 transmitted to all facilities within larger geographic area on   Patient informed that his/her managed care company has contracts with or will negotiate with  certain facilities, including the following:     Patient/family informed of bed offers received:   05/09/2013 Patient chooses bed at [place] Physician recommends and patient chooses bed at    Patient to be transferred to [place] on   Patient to be transferred to facility by   The following physician request were entered in Epic:   Additional Comments:   Raynaldo Opitz, [place] Worker cell #: [phone]

## 2013-05-11 NOTE — Progress Notes (Signed)
PROGRESS NOTE  [name] WJX:[id number] DOB: 1930/02/29 DOA: 04/25/2013 PCP: [name], MD  Assessment/Plan: DVT left lower extremity  - IVC filter placed 05/06/2013  - Dr. 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    Antibiotics - none  HPI/Subjective: - without complaints today   Objective: Filed Vitals:   05/10/13 0530 05/10/13 1355 05/10/13 2049 05/11/13 0600  BP: 143/88 125/81 125/75 133/68  Pulse: 81 77 75 79  Temp: 98 F (36.7 C) 98.7 F (37.1 C) 99.1 F (37.3 C) 98.5 F (36.9 C)  TempSrc: Oral Oral Oral Oral  Resp: 20 18 18 18   Height:      Weight:      SpO2: 96% 95% 95% 98%    Intake/Output Summary (Last 24 hours) at 05/11/13 1323 Last data filed at 05/11/13 1100  Gross per 24 hour  Intake 527.92 ml  Output   1925 ml  Net -1397.08 ml   Filed Weights   04/25/13 1422 04/26/13 1800 04/27/13 0400  Weight: 127.914 kg (282 lb) 126.6 kg (279 lb 1.6 oz) 127.4 kg (280 lb 13.9 oz)   Exam:  General:  NAD  Cardiovascular: regular rate and rhythm, without MRG  Respiratory: good air movement, clear to auscultation throughout, no wheezing, ronchi or rales  Abdomen: soft, not tender to palpation, positive bowel sounds  MSK: LLE swelling, palpable pulses  Neuro: non focal  Data Reviewed: Basic Metabolic Panel:  Recent Labs Lab 05/05/13 0353 05/06/13 0437 05/09/13 0447  NA 136* 139 137  K 4.7 4.6 4.4    CL 100 102 101  CO2 24 28 26   GLUCOSE 120* 89 96  BUN 25* 26* 24*  CREATININE 1.43* 1.35 1.32  CALCIUM 8.8 8.9 9.1   CBC:  Recent Labs Lab 05/06/13 0437 05/08/13 0415 05/09/13 0447 05/10/13 0343 05/11/13 0419  WBC  --  8.1 8.3 7.7 6.9  HGB 10.6* 11.4* 10.9* 11.1* 11.0*  HCT 32.9* 35.1* 33.8* 33.7* 33.9*  MCV  --  85.0 84.9 84.9 85.0  PLT  --  224 261 256 266   Scheduled Meds: . atorvastatin  40 mg Oral q1800  . docusate sodium  100 mg Oral BID  . doxazosin  8 mg Oral QHS  . fesoterodine  8 mg Oral Daily  . polyethylene glycol  17 g Oral BID  . warfarin  10 mg Oral ONCE-1800  . Warfarin - Pharmacist Dosing Inpatient   Does not apply q1800   Continuous Infusions: . heparin 1,650 Units/hr (05/11/13 0801)   Principal Problem:   DVT, lower extremity Active Problems:   Renal stone   Junctional cardiac arrhythmia   Severe sinus bradycardia   Infection of urinary tract   Thrombocytopenia, unspecified   Gross hematuria  Time spent: 15  This note has been created with Surveyor, quantity. Any transcriptional errors are unintentional.   Marzetta Board, MD [facility] Pager [phone]. If 7 PM - 7 AM, please contact night-coverage at  [email or web address], password [facility] 05/11/2013, 1:23 PM  LOS: 16 days

## 2013-05-11 NOTE — Progress Notes (Signed)
Physical Therapy Treatment Patient Details Name: [name] MRN: [id number] DOB: Jun 03, 1929 Today's Date: 05/11/2013 Time: 5009-3818 PT Time Calculation (min): 25 min  PT Assessment / Plan / Recommendation  History of Present Illness Mr. Maynez returns today in f/u.  He has a history of a 3.56mm right renal stone and a 1.4cm LLP stone with microhematuria.  He thinks he passed a stone on the left about a month ago.   He has had no flank pain or gross hematuria.   he has no dysuria but he does have some urgency and UUI.   He has a history of a UTI in 2/14 and had another in 7/14 that was treated with fosfomycin but he doesn't think that helped, and his UA today looks infected.  He has a history of prostate cancer treated in 2008 . Pt is S/P  L percutaneous Nephrolithotomy on 04/27/13.   PT Comments   Assisted pt OOB to amb in hallway.  Pt progressing well.  Follow Up Recommendations  SNF     Does the patient have the potential to tolerate intense rehabilitation     Barriers to Discharge        Equipment Recommendations       Recommendations for Other Services    Frequency Min 3X/week   Progress towards PT Goals Progress towards PT goals: Progressing toward goals  Plan      Precautions / Restrictions Precautions Precautions: Fall Restrictions Weight Bearing Restrictions: No   Pertinent Vitals/Pain     Mobility  Bed Mobility Overal bed mobility: Modified Independent General bed mobility comments: increased time and use of rails Transfers Overall transfer level: Needs assistance Equipment used:  (EVA walker) Transfers: Sit to/from Stand Sit to Stand: Supervision;Min guard Stand pivot transfers: Supervision;Min guard General transfer comment: good use of hands and safety tech Ambulation/Gait Ambulation/Gait assistance: Supervision;Min guard Ambulation Distance (Feet): 400 Feet (around full unit) Assistive device:  (EVA walker) Gait Pattern/deviations: Step-through  pattern Gait velocity: decreased General Gait Details: Used EVA walker for increased support.  Pt tolerating a great distance.      PT Goals (current goals can now be found in the care plan section)    Visit Information  Last PT Received On: 05/11/13 Assistance Needed: +1 History of Present Illness: Mr. Lesesne returns today in f/u.  He has a history of a 3.92mm right renal stone and a 1.4cm LLP stone with microhematuria.  He thinks he passed a stone on the left about a month ago.   He has had no flank pain or gross hematuria.   he has no dysuria but he does have some urgency and UUI.   He has a history of a UTI in 2/14 and had another in 7/14 that was treated with fosfomycin but he doesn't think that helped, and his UA today looks infected.  He has a history of prostate cancer treated in 2008 . Pt is S/P  L percutaneous Nephrolithotomy on 04/27/13.    Subjective Data      Cognition       Balance     End of Session PT - End of Session Equipment Utilized During Treatment: Gait belt Activity Tolerance: Patient tolerated treatment well Patient left: in bed;with call bell/phone within reach   Rica Koyanagi  PTA [facility]  Acute  Rehab Pager      [phone]

## 2013-05-11 NOTE — Progress Notes (Signed)
Patient ID: [name], male   DOB: March 27, 1929, 80 y.o.   MRN: [id number] 8 Days Post-Op  Subjective: [name] is doing well without complaints.  He remains on a heparin drip and coumadin for his acute left DVT and his INR is up slightly to 1.38 but it is not therapeutic.   He has no pain and continues to void well.  ROS: Negative except as above  Objective: Vital signs in last 24 hours: Temp:  [98.5 F (36.9 C)-99.1 F (37.3 C)] 98.5 F (36.9 C) (02/27 0600) Pulse Rate:  [75-79] 79 (02/27 0600) Resp:  [18] 18 (02/27 0600) BP: (125-133)/(68-81) 133/68 mmHg (02/27 0600) SpO2:  [95 %-98 %] 98 % (02/27 0600)  Intake/Output from previous day: 02/26 0701 - 02/27 0700 In: 287.9 [P.O.:120; I.V.:167.9] Out: 1675 [Urine:1675] Intake/Output this shift: Total I/O In: -  Out: 400 [Urine:400]  General appearance: alert and no distress Resp: clear to auscultation bilaterally Cardio: regular rate and rhythm GI: soft, non-tender; bowel sounds normal; no masses,  no organomegaly and His perc site is still oozing some old blood and the dressing was changed.  Ext:  LLE edema is improved.  RLE is without swelling.  Compression hose are off temporarily.   Lab Results:   Recent Labs  05/10/13 0343 05/11/13 0419  WBC 7.7 6.9  HGB 11.1* 11.0*  HCT 33.7* 33.9*  PLT 256 266   BMET  Recent Labs  05/09/13 0447  NA 137  K 4.4  CL 101  CO2 26  GLUCOSE 96  BUN 24*  CREATININE 1.32  CALCIUM 9.1   PT/INR  Recent Labs  05/10/13 0343 05/11/13 0419  LABPROT 15.2 16.6*  INR 1.23 1.38   ABG No results found for this basename: PHART, PCO2, PO2, HCO3,  in the last 72 hours  UA from 2/27 has 7-10 WBC and TNTC RBC which is consistent with having a recent PCNL.    Studies/Results: No results found.  Anti-infectives: Anti-infectives   Start     Dose/Rate Route Frequency Ordered Stop   05/03/13 0600  cefTRIAXone (ROCEPHIN) 2 g in dextrose 5 % 50 mL IVPB     2 g 100 mL/hr over  30 Minutes Intravenous 30 min pre-op 05/02/13 0902 05/03/13 1112   04/28/13 1200  cefTRIAXone (ROCEPHIN) 1 g in dextrose 5 % 50 mL IVPB  Status:  Discontinued     1 g 100 mL/hr over 30 Minutes Intravenous Every 24 hours 04/27/13 1040 05/04/13 0731   04/26/13 1800  tobramycin (NEBCIN) 700 mg in dextrose 5 % 100 mL IVPB  Status:  Discontinued     700 mg 117.5 mL/hr over 60 Minutes Intravenous Every 24 hours 04/26/13 1731 04/27/13 0710   04/26/13 0853  ciprofloxacin (CIPRO) 400 MG/200ML IVPB    Comments:  Margaretmary Dys   : cabinet override      04/26/13 0853 04/26/13 0943   04/26/13 0730  ciprofloxacin (CIPRO) IVPB 400 mg     400 mg 200 mL/hr over 60 Minutes Intravenous On call 04/25/13 1423 04/26/13 1029   04/25/13 1730  [MAR Hold]  tobramycin (NEBCIN) 700 mg in dextrose 5 % 100 mL IVPB  Status:  Discontinued     (On MAR Hold since 04/26/13 1014)   700 mg 117.5 mL/hr over 60 Minutes Intravenous Every 24 hours 04/25/13 1637 04/26/13 1637   04/25/13 1415  tobramycin (NEBCIN) 60 mg in dextrose 5 % 50 mL IVPB  Status:  Discontinued    Comments:  PER PHARMACY DOSING   60 mg 103 mL/hr over 30 Minutes Intravenous  Once 04/25/13 1409 04/25/13 1419      Current Facility-Administered Medications  Medication Dose Route Frequency Provider Last Rate Last Dose  . acetaminophen (TYLENOL) tablet 650 mg  650 mg Oral Q4H PRN [name], MD   650 mg at 04/28/13 2127  . atorvastatin (LIPITOR) tablet 40 mg  40 mg Oral q1800 [name], MD   40 mg at 05/10/13 1739  . bisacodyl (DULCOLAX) suppository 10 mg  10 mg Rectal Daily PRN [name], MD   10 mg at 05/04/13 1402  . diphenhydrAMINE (BENADRYL) injection 12.5 mg  12.5 mg Intravenous Q6H PRN [name], MD       Or  . diphenhydrAMINE (BENADRYL) 12.5 MG/5ML elixir 12.5 mg  12.5 mg Oral Q6H PRN [name], MD   12.5 mg at 04/30/13 1828  . docusate sodium (COLACE) capsule 100 mg  100 mg Oral BID [name], MD   100 mg at 05/10/13 2130  . doxazosin (CARDURA)  tablet 8 mg  8 mg Oral QHS [name], MD   8 mg at 05/10/13 2130  . fesoterodine (TOVIAZ) tablet 8 mg  8 mg Oral Daily [name], MD   8 mg at 05/10/13 0930  . heparin ADULT infusion 100 units/mL (25000 units/250 mL)  1,650 Units/hr Intravenous Continuous Octavio Graves Summe, RPH 16.5 mL/hr at 05/11/13 0801 1,650 Units/hr at 05/11/13 0801  . HYDROmorphone (DILAUDID) injection 0.5-1 mg  0.5-1 mg Intravenous Q2H PRN [name], MD   1 mg at 05/04/13 2100  . ondansetron (ZOFRAN) injection 4 mg  4 mg Intravenous Q4H PRN [name], MD   4 mg at 05/04/13 2100  . oxyCODONE-acetaminophen (PERCOCET/ROXICET) 5-325 MG per tablet 1-2 tablet  1-2 tablet Oral Q4H PRN [name], MD   2 tablet at 05/08/13 1132  . polyethylene glycol (MIRALAX / GLYCOLAX) packet 17 g  17 g Oral BID [name], MD   17 g at 05/10/13 2130  . traMADol (ULTRAM) tablet 50 mg  50 mg Oral QID PRN [name], MD   50 mg at 05/04/13 1943  . warfarin (COUMADIN) tablet 10 mg  10 mg Oral ONCE-1800 Colleen E Summe, RPH      . Warfarin - Pharmacist Dosing Inpatient   Does not apply q1800 [name], RPH        Assessment: s/p Procedure(s): 2ND STAGE LEFT PERCUTANEOUS NEPHROLITHOTOMY   Post DVT now on heparin and warfarin with slight increase in INR.   Plan: Continue current care. D/C home when INR is therapeutic.      LOS: 16 days    Jurgen Groeneveld J 05/11/2013

## 2013-05-12 LAB — URINALYSIS, ROUTINE W REFLEX MICROSCOPIC
Bilirubin Urine: NEGATIVE
Bilirubin Urine: NEGATIVE
GLUCOSE, UA: NEGATIVE mg/dL
Glucose, UA: NEGATIVE mg/dL
Ketones, ur: NEGATIVE mg/dL
Ketones, ur: NEGATIVE mg/dL
NITRITE: NEGATIVE
Nitrite: NEGATIVE
PROTEIN: NEGATIVE mg/dL
PROTEIN: NEGATIVE mg/dL
SPECIFIC GRAVITY, URINE: 1.014 (ref 1.005–1.030)
Specific Gravity, Urine: 1.017 (ref 1.005–1.030)
UROBILINOGEN UA: 2 mg/dL — AB (ref 0.0–1.0)
Urobilinogen, UA: 1 mg/dL (ref 0.0–1.0)
pH: 6.5 (ref 5.0–8.0)
pH: 6.5 (ref 5.0–8.0)

## 2013-05-12 LAB — CBC
HCT: 36.6 % — ABNORMAL LOW (ref 39.0–52.0)
HEMOGLOBIN: 11.6 g/dL — AB (ref 13.0–17.0)
MCH: 26.9 pg (ref 26.0–34.0)
MCHC: 31.7 g/dL (ref 30.0–36.0)
MCV: 84.9 fL (ref 78.0–100.0)
Platelets: 292 10*3/uL (ref 150–400)
RBC: 4.31 MIL/uL (ref 4.22–5.81)
RDW: 14.4 % (ref 11.5–15.5)
WBC: 6.5 10*3/uL (ref 4.0–10.5)

## 2013-05-12 LAB — URINE MICROSCOPIC-ADD ON

## 2013-05-12 LAB — PROTIME-INR
INR: 1.7 — ABNORMAL HIGH (ref 0.00–1.49)
PROTHROMBIN TIME: 19.5 s — AB (ref 11.6–15.2)

## 2013-05-12 LAB — HEPARIN LEVEL (UNFRACTIONATED): HEPARIN UNFRACTIONATED: 0.42 [IU]/mL (ref 0.30–0.70)

## 2013-05-12 MED ORDER — WARFARIN SODIUM 10 MG PO TABS
10.0000 mg | ORAL_TABLET | Freq: Once | ORAL | Status: AC
  Administered 2013-05-12: 10 mg via ORAL
  Filled 2013-05-12: qty 1

## 2013-05-12 MED ORDER — SENNOSIDES-DOCUSATE SODIUM 8.6-50 MG PO TABS: 1.0000 | ORAL_TABLET | Freq: Two times a day (BID) | ORAL | Status: DC | PRN

## 2013-05-12 MED ORDER — BISACODYL 5 MG PO TBEC
10.0000 mg | DELAYED_RELEASE_TABLET | Freq: Every day | ORAL | Status: DC | PRN
  Administered 2013-05-12: 10 mg via ORAL
  Filled 2013-05-12: qty 2

## 2013-05-12 NOTE — Progress Notes (Signed)
He looks good.  The left leg  Looks better.  He is still not therapeutic on Coumadin.  Continues on Heparin. I appreciate pharmacy's help.   No bleeding.  CBC is stable.  VSS.    Left leg with some mild non-pitting edema.  Lungs:  Clear. COR:  RRR  Again, await coumadin to be therapeutic.  Lum Keas  Psalms 34:4

## 2013-05-12 NOTE — Progress Notes (Signed)
Chart review.  Noted that MD note from today says that Pt's d/c needs to be on hold until "INR therapeutic."  CSW will continue to follow.  Bernita Raisin, [place] [facility] [phone]

## 2013-05-12 NOTE — Progress Notes (Signed)
   PROGRESS NOTE  [name] JXB:[id number] DOB: 11-30-1929 DOA: 04/25/2013 PCP: [name], MD  Assessment/Plan: DVT left lower extremity  - IVC filter placed 05/06/2013  - Dr. Jonette Eva consulted for further recommendations, we appreciate input  - recommendation is to start anticoagulation wit Heparin despite IVC filter due to risk of post phlebitic syndrome, also place compression stocking to the left leg  - recommended 6 months of anticoagulation  - continue heparin, started coumadin 2/25, monitor INR daily per pharmacy - INR 1.7 this morning. Renal stone  - status post left percutaneous nephrolithotomy done 2/19  - management per GU  Dyslipidemia  - Continue statin therapy  Acute renal failure  - Cr stable Chronic combined systolic and diastolic heart failure - compensated.    Antibiotics - none  HPI/Subjective: - without complaints today   Objective: Filed Vitals:   05/11/13 1532 05/11/13 2112 05/12/13 0607 05/12/13 1358  BP: 131/70 111/63 127/81 114/79  Pulse: 65 80 81 77  Temp: 98.1 F (36.7 C) 98.6 F (37 C) 97.9 F (36.6 C) 97.6 F (36.4 C)  TempSrc: Oral Oral Oral Oral  Resp: 18 18 20 18   Height:      Weight:      SpO2: 91% 98% 95% 96%    Intake/Output Summary (Last 24 hours) at 05/12/13 1801 Last data filed at 05/12/13 1300  Gross per 24 hour  Intake 502.42 ml  Output    875 ml  Net -372.58 ml   Filed Weights   04/25/13 1422 04/26/13 1800 04/27/13 0400  Weight: 127.914 kg (282 lb) 126.6 kg (279 lb 1.6 oz) 127.4 kg (280 lb 13.9 oz)   Exam:  General:  NAD  Cardiovascular: regular rate and rhythm, without MRG  Respiratory: good air movement, clear to auscultation throughout, no wheezing, ronchi or rales  Abdomen: soft, not tender to palpation, positive bowel sounds  MSK: LLE swelling, palpable pulses  Neuro: non focal  Data Reviewed: Basic Metabolic Panel:  Recent Labs Lab 05/06/13 0437 05/09/13 0447  NA 139 137  K 4.6 4.4    CL 102 101  CO2 28 26  GLUCOSE 89 96  BUN 26* 24*  CREATININE 1.35 1.32  CALCIUM 8.9 9.1   CBC:  Recent Labs Lab 05/08/13 0415 05/09/13 0447 05/10/13 0343 05/11/13 0419 05/12/13 0555  WBC 8.1 8.3 7.7 6.9 6.5  HGB 11.4* 10.9* 11.1* 11.0* 11.6*  HCT 35.1* 33.8* 33.7* 33.9* 36.6*  MCV 85.0 84.9 84.9 85.0 84.9  PLT 224 261 256 266 292   Scheduled Meds: . atorvastatin  40 mg Oral q1800  . docusate sodium  100 mg Oral BID  . doxazosin  8 mg Oral QHS  . fesoterodine  8 mg Oral Daily  . polyethylene glycol  17 g Oral BID  . Warfarin - Pharmacist Dosing Inpatient   Does not apply q1800   Continuous Infusions: . heparin 1,650 Units/hr (05/12/13 1424)   Principal Problem:   DVT, lower extremity Active Problems:   Renal stone   Junctional cardiac arrhythmia   Severe sinus bradycardia   Infection of urinary tract   Thrombocytopenia, unspecified   Gross hematuria  Time spent: 15  This note has been created with Surveyor, quantity. Any transcriptional errors are unintentional.   Marzetta Board, MD [facility] Pager [phone]. If 7 PM - 7 AM, please contact night-coverage at [email or web address], password [facility] 05/12/2013, 6:01 PM  LOS: 17 days

## 2013-05-12 NOTE — Progress Notes (Signed)
ANTICOAGULATION CONSULT NOTE - Follow Up Consult  Pharmacy Consult for Heparin / Warfarin Indication: DVT  Allergies  Allergen Reactions  . Penicillins Other (See Comments)    HIVES     Patient Measurements: Height: 6\' 2"  (188 cm) Weight: 280 lb 13.9 oz (127.4 kg) IBW/kg (Calculated) : 82.2 Heparin Dosing Weight:   Vital Signs: Temp: 97.9 F (36.6 C) (02/28 0607) Temp src: Oral (02/28 0607) BP: 127/81 mmHg (02/28 0607) Pulse Rate: 81 (02/28 0607)  Labs:  Recent Labs  05/10/13 0343  05/11/13 0419 05/11/13 1614 05/12/13 0555  HGB 11.1*  --  11.0*  --  11.6*  HCT 33.7*  --  33.9*  --  36.6*  PLT 256  --  266  --  292  LABPROT 15.2  --  16.6*  --  19.5*  INR 1.23  --  1.38  --  1.70*  HEPARINUNFRC 0.41  < > 0.31 0.42 0.42  < > = values in this interval not displayed.  Estimated Creatinine Clearance: 62.3 ml/min (by C-G formula based on Cr of 1.32).   Medications:  Infusions:  . heparin 1,650 Units/hr (05/11/13 2217)    Assessment: 43 yoM with PMHx HTN, BPH, hx prostate cancer and radiation, underwent left percutaneous nephrolithotomy and then found to have LLE DVT with propagation.  Pt initially started on lovenox and warfarn which was subsequently held due to hematuria.  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  Also has compression stocking on left leg.   2/28: D4 warfarin / heparin bridge.   AM heparin level = 0.42, in therapeutic range x2  INR = 1.70, increased   CBC stable.  No bleeding or infusion site issues per RN.  Heparin dosing weight = 110kg.    Goal of Therapy:  Heparin level 0.3-0.7 units/ml Monitor platelets by anticoagulation protocol: Yes INR 2-3   Plan:  - continue heparin at 1650 units/hr - Coumadin 10mg  po x 1 tonight at 1800.  - Heparin level, PT/INR and CBC in AM - Monitor for bleeding  Thank you for the consult.  Johny Drilling, PharmD, BCPS Pager:  [phone] Pharmacy: [phone] 05/12/2013 9:32 AM

## 2013-05-12 NOTE — Progress Notes (Signed)
Patient ID: [name], male   DOB: 1929-09-25, 80 y.o.   MRN: [id number] 9 Days Post-Op  Subjective:  No GU events overnight. Tolerating regular diet. No abdominal or back pain. Voiding without difficulty; denies gross hematuria.    ROS: Negative except as above  Objective: Vital signs in last 24 hours: Temp:  [97.9 F (36.6 C)-98.6 F (37 C)] 97.9 F (36.6 C) (02/28 0607) Pulse Rate:  [65-81] 81 (02/28 0607) Resp:  [18-20] 20 (02/28 0607) BP: (111-131)/(63-81) 127/81 mmHg (02/28 0607) SpO2:  [91 %-98 %] 95 % (02/28 0607)  Intake/Output from previous day: 02/27 0701 - 02/28 0700 In: 897.4 [P.O.:480; I.V.:177.4] Out: 1200 [Urine:1200] Intake/Output this shift: Total I/O In: -  Out: 200 [Urine:200]  Gen: NAD, AA CV: RRR, No rubs. Chest: CTA-B, no wheezes Abd: soft, ND, NTTP Flank: left flank dressed, no soakage, negative flank ecchymosis. Ext: BLE edema, negative erythema.  Lab Results:   Recent Labs  05/11/13 0419 05/12/13 0555  WBC 6.9 6.5  HGB 11.0* 11.6*  HCT 33.9* 36.6*  PLT 266 292   BMET No results found for this basename: NA, K, CL, CO2, GLUCOSE, BUN, CREATININE, CALCIUM,  in the last 72 hours PT/INR  Recent Labs  05/11/13 0419 05/12/13 0555  LABPROT 16.6* 19.5*  INR 1.38 1.70*   ABG No results found for this basename: PHART, PCO2, PO2, HCO3,  in the last 72 hours  UA from 2/27 has 7-10 WBC and TNTC RBC which is consistent with having a recent PCNL.    Studies/Results: No results found.  Anti-infectives: Anti-infectives   Start     Dose/Rate Route Frequency Ordered Stop   05/03/13 0600  cefTRIAXone (ROCEPHIN) 2 g in dextrose 5 % 50 mL IVPB     2 g 100 mL/hr over 30 Minutes Intravenous 30 min pre-op 05/02/13 0902 05/03/13 1112   04/28/13 1200  cefTRIAXone (ROCEPHIN) 1 g in dextrose 5 % 50 mL IVPB  Status:  Discontinued     1 g 100 mL/hr over 30 Minutes Intravenous Every 24 hours 04/27/13 1040 05/04/13 0731   04/26/13 1800   tobramycin (NEBCIN) 700 mg in dextrose 5 % 100 mL IVPB  Status:  Discontinued     700 mg 117.5 mL/hr over 60 Minutes Intravenous Every 24 hours 04/26/13 1731 04/27/13 0710   04/26/13 0853  ciprofloxacin (CIPRO) 400 MG/200ML IVPB    Comments:  Margaretmary Dys   : cabinet override      04/26/13 0853 04/26/13 0943   04/26/13 0730  ciprofloxacin (CIPRO) IVPB 400 mg     400 mg 200 mL/hr over 60 Minutes Intravenous On call 04/25/13 1423 04/26/13 1029   04/25/13 1730  [MAR Hold]  tobramycin (NEBCIN) 700 mg in dextrose 5 % 100 mL IVPB  Status:  Discontinued     (On MAR Hold since 04/26/13 1014)   700 mg 117.5 mL/hr over 60 Minutes Intravenous Every 24 hours 04/25/13 1637 04/26/13 1637   04/25/13 1415  tobramycin (NEBCIN) 60 mg in dextrose 5 % 50 mL IVPB  Status:  Discontinued    Comments:  PER PHARMACY DOSING   60 mg 103 mL/hr over 30 Minutes Intravenous  Once 04/25/13 1409 04/25/13 1419      Current Facility-Administered Medications  Medication Dose Route Frequency Provider Last Rate Last Dose  . acetaminophen (TYLENOL) tablet 650 mg  650 mg Oral Q4H PRN [name], MD   650 mg at 04/28/13 2127  . atorvastatin (LIPITOR) tablet 40 mg  40 mg Oral q1800 [name], MD   40 mg at 05/11/13 1742  . bisacodyl (DULCOLAX) suppository 10 mg  10 mg Rectal Daily PRN [name], MD   10 mg at 05/04/13 1402  . diphenhydrAMINE (BENADRYL) injection 12.5 mg  12.5 mg Intravenous Q6H PRN [name], MD       Or  . diphenhydrAMINE (BENADRYL) 12.5 MG/5ML elixir 12.5 mg  12.5 mg Oral Q6H PRN [name], MD   12.5 mg at 04/30/13 1828  . docusate sodium (COLACE) capsule 100 mg  100 mg Oral BID [name], MD   100 mg at 05/11/13 1120  . doxazosin (CARDURA) tablet 8 mg  8 mg Oral QHS [name], MD   8 mg at 05/11/13 2217  . fesoterodine (TOVIAZ) tablet 8 mg  8 mg Oral Daily [name], MD   8 mg at 05/11/13 1120  . heparin ADULT infusion 100 units/mL (25000 units/250 mL)  1,650 Units/hr Intravenous Continuous Octavio Graves  Summe, RPH 16.5 mL/hr at 05/11/13 2217 1,650 Units/hr at 05/11/13 2217  . HYDROmorphone (DILAUDID) injection 0.5-1 mg  0.5-1 mg Intravenous Q2H PRN [name], MD   1 mg at 05/04/13 2100  . ondansetron (ZOFRAN) injection 4 mg  4 mg Intravenous Q4H PRN [name], MD   4 mg at 05/04/13 2100  . oxyCODONE-acetaminophen (PERCOCET/ROXICET) 5-325 MG per tablet 1-2 tablet  1-2 tablet Oral Q4H PRN [name], MD   2 tablet at 05/08/13 1132  . polyethylene glycol (MIRALAX / GLYCOLAX) packet 17 g  17 g Oral BID [name], MD   17 g at 05/11/13 1120  . traMADol (ULTRAM) tablet 50 mg  50 mg Oral QID PRN [name], MD   50 mg at 05/04/13 1943  . Warfarin - Pharmacist Dosing Inpatient   Does not apply q1800 [name], RPH        Assessment: s/p Procedure(s): 2ND STAGE LEFT PERCUTANEOUS NEPHROLITHOTOMY   Post DVT now on heparin and warfarin with slight increase in INR.   Plan: Continue heparin and coumadin. INR is not therapeutic at this time. Hold discharge until INR therapeutic.     LOS: 17 days    [name] 05/12/2013

## 2013-05-13 LAB — CBC
HEMATOCRIT: 34.8 % — AB (ref 39.0–52.0)
Hemoglobin: 11.5 g/dL — ABNORMAL LOW (ref 13.0–17.0)
MCH: 27.9 pg (ref 26.0–34.0)
MCHC: 33 g/dL (ref 30.0–36.0)
MCV: 84.5 fL (ref 78.0–100.0)
Platelets: 270 10*3/uL (ref 150–400)
RBC: 4.12 MIL/uL — ABNORMAL LOW (ref 4.22–5.81)
RDW: 14.6 % (ref 11.5–15.5)
WBC: 7 10*3/uL (ref 4.0–10.5)

## 2013-05-13 LAB — PROTIME-INR
INR: 2.27 — ABNORMAL HIGH (ref 0.00–1.49)
Prothrombin Time: 24.3 seconds — ABNORMAL HIGH (ref 11.6–15.2)

## 2013-05-13 LAB — HEPARIN LEVEL (UNFRACTIONATED): Heparin Unfractionated: 0.49 IU/mL (ref 0.30–0.70)

## 2013-05-13 MED ORDER — WARFARIN SODIUM 7.5 MG PO TABS
7.5000 mg | ORAL_TABLET | Freq: Once | ORAL | Status: AC
Start: 2013-05-13 — End: 2013-05-13
  Administered 2013-05-13: 7.5 mg via ORAL
  Filled 2013-05-13: qty 1

## 2013-05-13 NOTE — Progress Notes (Signed)
ANTICOAGULATION CONSULT NOTE - Follow Up Consult  Pharmacy Consult for Heparin / Warfarin Indication: DVT  Allergies  Allergen Reactions  . Penicillins Other (See Comments)    HIVES     Patient Measurements: Height: 6\' 2"  (188 cm) Weight: 280 lb 13.9 oz (127.4 kg) IBW/kg (Calculated) : 82.2  Vital Signs: Temp: 97.6 F (36.4 C) (03/01 0458) Temp src: Oral (03/01 0458) BP: 127/81 mmHg (03/01 0458) Pulse Rate: 89 (03/01 0458)  Labs:  Recent Labs  05/11/13 0419 05/11/13 1614 05/12/13 0555 05/13/13 0500  HGB 11.0*  --  11.6* 11.5*  HCT 33.9*  --  36.6* 34.8*  PLT 266  --  292 270  LABPROT 16.6*  --  19.5* 24.3*  INR 1.38  --  1.70* 2.27*  HEPARINUNFRC 0.31 0.42 0.42 0.49    Estimated Creatinine Clearance: 62.3 ml/min (by C-G formula based on Cr of 1.32).   Medications:  Infusions:  . heparin 1,650 Units/hr (05/13/13 0507)    Assessment: 27 yoM with PMHx HTN, BPH, hx prostate cancer and radiation, underwent left percutaneous nephrolithotomy and then found to have LLE DVT with propagation.  Pt initially started on lovenox and warfarn which was subsequently held due to hematuria.  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  Also has compression stocking on left leg.   3/1: D5 warfarin / heparin bridge.   AM heparin level = 0.45, remains in therapeutic range INR = 2.27, now within therapeutic range   CBC stable.  No bleeding or infusion site issues per RN.  Heparin dosing weight = 110kg.   Patient eating 100% of meals  Discharge planning: Noted planned discharge to SNF on 3/2 Patient's INR slow at first to respond to warfarin dosing, now with large jumps Anticipate home warfarin dosed with 5mg  tablets Would recommend home dose to be 5mg  (1 tablet) daily with 7.5mg  (1.5 tablets) MWF INR should be checked Tues (3/4) and Fri (3/7) next week for continued dosing  Goal of Therapy:  Heparin level  0.3-0.7 units/ml Monitor platelets by anticoagulation protocol: Yes INR 2-3   Plan:  - continue heparin at 1650 units/hr - Coumadin 7.5mg  po x 1 tonight at 1800.  - Heparin level, PT/INR and CBC in AM - Monitor for bleeding  Thank you for the consult.  Johny Drilling, PharmD, BCPS Pager: [phone] Pharmacy: [phone] 05/13/2013 8:26 AM

## 2013-05-13 NOTE — Progress Notes (Signed)
   PROGRESS NOTE  [name] AYT:[id number] DOB: 20-Jul-1929 DOA: 04/25/2013 PCP: [name], MD  Assessment/Plan: DVT left lower extremity  - IVC filter placed 05/06/2013  - Dr. Jonette Eva consulted for further recommendations, continue Coumadin for at least 6 months.  - INR 2.27 this morning, continue overlap with heparin for 24 hours. Can discontinue heparin gtt in am and patient should be ready for discharge.  Renal stone  Dyslipidemia  Acute renal failure - Cr stable Chronic combined systolic and diastolic heart failure - compensated.   Thank you for this consult, will sign off. Please call with further questions.    Antibiotics - none  HPI/Subjective: - without complaints today   Objective: Filed Vitals:   05/12/13 0607 05/12/13 1358 05/12/13 2059 05/13/13 0458  BP: 127/81 114/79 119/75 127/81  Pulse: 81 77 85 89  Temp: 97.9 F (36.6 C) 97.6 F (36.4 C) 97.4 F (36.3 C) 97.6 F (36.4 C)  TempSrc: Oral Oral Oral Oral  Resp: 20 18 18 18   Height:      Weight:      SpO2: 95% 96% 95% 98%    Intake/Output Summary (Last 24 hours) at 05/13/13 1257 Last data filed at 05/13/13 0900  Gross per 24 hour  Intake    996 ml  Output    875 ml  Net    121 ml   Filed Weights   04/25/13 1422 04/26/13 1800 04/27/13 0400  Weight: 127.914 kg (282 lb) 126.6 kg (279 lb 1.6 oz) 127.4 kg (280 lb 13.9 oz)   Exam:  General:  NAD  Cardiovascular: regular rate and rhythm, without MRG  Respiratory: good air movement, clear to auscultation throughout, no wheezing, ronchi or rales  Abdomen: soft, not tender to palpation, positive bowel sounds  MSK: LLE swelling, palpable pulses  Neuro: non focal  Data Reviewed: Basic Metabolic Panel:  Recent Labs Lab 05/09/13 0447  NA 137  K 4.4  CL 101  CO2 26  GLUCOSE 96  BUN 24*  CREATININE 1.32  CALCIUM 9.1   CBC:  Recent Labs Lab 05/09/13 0447 05/10/13 0343 05/11/13 0419 05/12/13 0555 05/13/13 0500  WBC 8.3 7.7 6.9  6.5 7.0  HGB 10.9* 11.1* 11.0* 11.6* 11.5*  HCT 33.8* 33.7* 33.9* 36.6* 34.8*  MCV 84.9 84.9 85.0 84.9 84.5  PLT 261 256 266 292 270   Scheduled Meds: . atorvastatin  40 mg Oral q1800  . docusate sodium  100 mg Oral BID  . doxazosin  8 mg Oral QHS  . fesoterodine  8 mg Oral Daily  . polyethylene glycol  17 g Oral BID  . warfarin  7.5 mg Oral ONCE-1800  . Warfarin - Pharmacist Dosing Inpatient   Does not apply q1800   Continuous Infusions: . heparin 1,650 Units/hr (05/13/13 0507)   Principal Problem:   DVT, lower extremity Active Problems:   Renal stone   Junctional cardiac arrhythmia   Severe sinus bradycardia   Infection of urinary tract   Thrombocytopenia, unspecified   Gross hematuria  Time spent: 15  This note has been created with Surveyor, quantity. Any transcriptional errors are unintentional.   Marzetta Board, MD [facility] Pager [phone]. If 7 PM - 7 AM, please contact night-coverage at [email or web address], password [facility] 05/13/2013, 12:57 PM  LOS: 18 days

## 2013-05-13 NOTE — Progress Notes (Signed)
Patient ID: KEONDRE MARKSON, male   DOB: 03-Jul-1929, 80 y.o.   MRN: [id number] 10 Days Post-Op  Subjective:  No GU events overnight. Positive BM. Negative gross hematuria.   ROS: Negative except as above  Objective: Vital signs in last 24 hours: Temp:  [97.4 F (36.3 C)-97.6 F (36.4 C)] 97.6 F (36.4 C) (03/01 0458) Pulse Rate:  [77-89] 89 (03/01 0458) Resp:  [18] 18 (03/01 0458) BP: (114-127)/(75-81) 127/81 mmHg (03/01 0458) SpO2:  [95 %-98 %] 98 % (03/01 0458)  Intake/Output from previous day: 02/28 0701 - 03/01 0700 In: 798 [P.O.:600; I.V.:198] Out: 1075 [Urine:1075] Intake/Output this shift: Total I/O In: -  Out: 600 [Urine:600]  Gen: NAD, AA CV: RRR, No rubs. Chest: CTA-B, no wheezes Abd: soft, ND, NTTP Flank: left flank dressed, no soakage, negative flank ecchymosis. Ext: BLE edema, negative erythema.  Lab Results:   Recent Labs  05/12/13 0555 05/13/13 0500  WBC 6.5 7.0  HGB 11.6* 11.5*  HCT 36.6* 34.8*  PLT 292 270   BMET No results found for this basename: NA, K, CL, CO2, GLUCOSE, BUN, CREATININE, CALCIUM,  in the last 72 hours PT/INR  Recent Labs  05/12/13 0555 05/13/13 0500  LABPROT 19.5* 24.3*  INR 1.70* 2.27*   ABG No results found for this basename: PHART, PCO2, PO2, HCO3,  in the last 72 hours  UA from 2/27 has 7-10 WBC and TNTC RBC which is consistent with having a recent PCNL.    Studies/Results: No results found.  Anti-infectives: Anti-infectives   Start     Dose/Rate Route Frequency Ordered Stop   05/03/13 0600  cefTRIAXone (ROCEPHIN) 2 g in dextrose 5 % 50 mL IVPB     2 g 100 mL/hr over 30 Minutes Intravenous 30 min pre-op 05/02/13 0902 05/03/13 1112   04/28/13 1200  cefTRIAXone (ROCEPHIN) 1 g in dextrose 5 % 50 mL IVPB  Status:  Discontinued     1 g 100 mL/hr over 30 Minutes Intravenous Every 24 hours 04/27/13 1040 05/04/13 0731   04/26/13 1800  tobramycin (NEBCIN) 700 mg in dextrose 5 % 100 mL IVPB  Status:  Discontinued      700 mg 117.5 mL/hr over 60 Minutes Intravenous Every 24 hours 04/26/13 1731 04/27/13 0710   04/26/13 0853  ciprofloxacin (CIPRO) 400 MG/200ML IVPB    Comments:  Margaretmary Dys   : cabinet override      04/26/13 0853 04/26/13 0943   04/26/13 0730  ciprofloxacin (CIPRO) IVPB 400 mg     400 mg 200 mL/hr over 60 Minutes Intravenous On call 04/25/13 1423 04/26/13 1029   04/25/13 1730  [MAR Hold]  tobramycin (NEBCIN) 700 mg in dextrose 5 % 100 mL IVPB  Status:  Discontinued     (On MAR Hold since 04/26/13 1014)   700 mg 117.5 mL/hr over 60 Minutes Intravenous Every 24 hours 04/25/13 1637 04/26/13 1637   04/25/13 1415  tobramycin (NEBCIN) 60 mg in dextrose 5 % 50 mL IVPB  Status:  Discontinued    Comments:  PER PHARMACY DOSING   60 mg 103 mL/hr over 30 Minutes Intravenous  Once 04/25/13 1409 04/25/13 1419      Current Facility-Administered Medications  Medication Dose Route Frequency Provider Last Rate Last Dose  . acetaminophen (TYLENOL) tablet 650 mg  650 mg Oral Q4H PRN [name], MD   650 mg at 04/28/13 2127  . atorvastatin (LIPITOR) tablet 40 mg  40 mg Oral q1800 [name], MD   40  mg at 05/12/13 1723  . bisacodyl (DULCOLAX) EC tablet 10 mg  10 mg Oral Daily PRN Caren Griffins, MD   10 mg at 05/12/13 1723  . bisacodyl (DULCOLAX) suppository 10 mg  10 mg Rectal Daily PRN [name], MD   10 mg at 05/04/13 1402  . diphenhydrAMINE (BENADRYL) injection 12.5 mg  12.5 mg Intravenous Q6H PRN [name], MD       Or  . diphenhydrAMINE (BENADRYL) 12.5 MG/5ML elixir 12.5 mg  12.5 mg Oral Q6H PRN [name], MD   12.5 mg at 04/30/13 1828  . docusate sodium (COLACE) capsule 100 mg  100 mg Oral BID [name], MD   100 mg at 05/12/13 2101  . doxazosin (CARDURA) tablet 8 mg  8 mg Oral QHS [name], MD   8 mg at 05/12/13 2101  . fesoterodine (TOVIAZ) tablet 8 mg  8 mg Oral Daily [name], MD   8 mg at 05/12/13 1047  . heparin ADULT infusion 100 units/mL (25000 units/250 mL)  1,650 Units/hr  Intravenous Continuous Octavio Graves Summe, RPH 16.5 mL/hr at 05/13/13 0507 1,650 Units/hr at 05/13/13 0507  . HYDROmorphone (DILAUDID) injection 0.5-1 mg  0.5-1 mg Intravenous Q2H PRN [name], MD   1 mg at 05/04/13 2100  . ondansetron (ZOFRAN) injection 4 mg  4 mg Intravenous Q4H PRN [name], MD   4 mg at 05/04/13 2100  . oxyCODONE-acetaminophen (PERCOCET/ROXICET) 5-325 MG per tablet 1-2 tablet  1-2 tablet Oral Q4H PRN [name], MD   2 tablet at 05/08/13 1132  . polyethylene glycol (MIRALAX / GLYCOLAX) packet 17 g  17 g Oral BID [name], MD   17 g at 05/11/13 1120  . senna-docusate (Senokot-S) tablet 1 tablet  1 tablet Oral BID PRN Caren Griffins, MD      . traMADol Veatrice Bourbon) tablet 50 mg  50 mg Oral QID PRN [name], MD   50 mg at 05/04/13 1943  . Warfarin - Pharmacist Dosing Inpatient   Does not apply q1800 [name], RPH        Assessment: s/p Procedure(s): 2ND STAGE LEFT PERCUTANEOUS NEPHROLITHOTOMY   Post DVT now on heparin and warfarin with slight increase in INR.   Plan: INR has reach therapeutic range. Need to establish dosing parameters for warfarin for discharge planning.  Recheck INR tomorrow to ensure stability.  Will plan for discharge to SNF tomorrow.     LOS: 18 days    [name] 05/13/2013

## 2013-05-13 NOTE — Progress Notes (Signed)
Chart review.  Noted that MD note states that Pt likely ready for d/c on Monday.  Weekday CSW to follow and facilitate d/c.  Bernita Raisin, [place] Work [phone]

## 2013-05-14 ENCOUNTER — Encounter (HOSPITAL_COMMUNITY): Payer: Self-pay | Admitting: Interventional Cardiology

## 2013-05-14 DIAGNOSIS — I359 Nonrheumatic aortic valve disorder, unspecified: Secondary | ICD-10-CM

## 2013-05-14 DIAGNOSIS — I428 Other cardiomyopathies: Secondary | ICD-10-CM

## 2013-05-14 LAB — COMPREHENSIVE METABOLIC PANEL
ALBUMIN: 2.5 g/dL — AB (ref 3.5–5.2)
ALT: 25 U/L (ref 0–53)
AST: 31 U/L (ref 0–37)
Alkaline Phosphatase: 63 U/L (ref 39–117)
BUN: 16 mg/dL (ref 6–23)
CALCIUM: 9.5 mg/dL (ref 8.4–10.5)
CO2: 26 mEq/L (ref 19–32)
CREATININE: 1.19 mg/dL (ref 0.50–1.35)
Chloride: 102 mEq/L (ref 96–112)
GFR calc Af Amer: 64 mL/min — ABNORMAL LOW (ref >90)
GFR calc non Af Amer: 55 mL/min — ABNORMAL LOW (ref >90)
Glucose, Bld: 99 mg/dL (ref 70–99)
Potassium: 4 mEq/L (ref 3.7–5.3)
SODIUM: 138 meq/L (ref 137–147)
Total Bilirubin: 0.4 mg/dL (ref 0.3–1.2)
Total Protein: 5.9 g/dL — ABNORMAL LOW (ref 6.0–8.3)

## 2013-05-14 LAB — CBC
HCT: 36.5 % — ABNORMAL LOW (ref 39.0–52.0)
HEMOGLOBIN: 11.6 g/dL — AB (ref 13.0–17.0)
MCH: 27 pg (ref 26.0–34.0)
MCHC: 31.8 g/dL (ref 30.0–36.0)
MCV: 85.1 fL (ref 78.0–100.0)
Platelets: 263 10*3/uL (ref 150–400)
RBC: 4.29 MIL/uL (ref 4.22–5.81)
RDW: 14.7 % (ref 11.5–15.5)
WBC: 6.2 10*3/uL (ref 4.0–10.5)

## 2013-05-14 LAB — HEPARIN LEVEL (UNFRACTIONATED): HEPARIN UNFRACTIONATED: 0.63 [IU]/mL (ref 0.30–0.70)

## 2013-05-14 LAB — PHOSPHORUS: PHOSPHORUS: 3.6 mg/dL (ref 2.3–4.6)

## 2013-05-14 LAB — MAGNESIUM: Magnesium: 2 mg/dL (ref 1.5–2.5)

## 2013-05-14 LAB — PROTIME-INR
INR: 2.87 — AB (ref 0.00–1.49)
PROTHROMBIN TIME: 29.1 s — AB (ref 11.6–15.2)

## 2013-05-14 MED ORDER — WARFARIN SODIUM 5 MG PO TABS: 5.0000 mg | ORAL_TABLET | Freq: Every day | ORAL | Status: DC

## 2013-05-14 NOTE — Progress Notes (Signed)
   PROGRESS NOTE  [name] OFB:[id number] DOB: Jan 07, 1930 DOA: 04/25/2013 PCP: [name], MD  Assessment/Plan: DVT left lower extremity  - IVC filter placed 05/06/2013  - Dr. Jonette Eva consulted for further recommendations, continue Coumadin for at least 6 months.  - INR therapeutic, d/c heparin gtt Sinus pause > 3 s - overnight, consulted cardiology to evaluate, appreciate input. Hold discharge until evaluated.  Renal stone - per primary team Dyslipidemia  Acute renal failure - Cr stable, repeat CMP this morning. Repeat Mg as well  Chronic combined systolic and diastolic heart failure - compensated.    Antibiotics - none  HPI/Subjective: - without chest pain, breathing difficulties.   Objective: Filed Vitals:   05/13/13 0458 05/13/13 1450 05/13/13 2116 05/14/13 0456  BP: 127/81 144/88 133/80 114/76  Pulse: 89 84 83 87  Temp: 97.6 F (36.4 C) 98.5 F (36.9 C) 98.8 F (37.1 C) 98.1 F (36.7 C)  TempSrc: Oral Oral Oral Oral  Resp: 18 20 20 20   Height:      Weight:      SpO2: 98% 97% 97% 94%    Intake/Output Summary (Last 24 hours) at 05/14/13 0752 Last data filed at 05/14/13 0700  Gross per 24 hour  Intake    960 ml  Output   1650 ml  Net   -690 ml   Filed Weights   04/25/13 1422 04/26/13 1800 04/27/13 0400  Weight: 127.914 kg (282 lb) 126.6 kg (279 lb 1.6 oz) 127.4 kg (280 lb 13.9 oz)   Exam:  General:  NAD  Cardiovascular: regular rate and rhythm, without MRG  Respiratory: good air movement, clear to auscultation throughout, no wheezing, ronchi or rales  Abdomen: soft, not tender to palpation, positive bowel sounds  MSK: LLE swelling, palpable pulses  Neuro: non focal  Data Reviewed: Basic Metabolic Panel:  Recent Labs Lab 05/09/13 0447  NA 137  K 4.4  CL 101  CO2 26  GLUCOSE 96  BUN 24*  CREATININE 1.32  CALCIUM 9.1   CBC:  Recent Labs Lab 05/10/13 0343 05/11/13 0419 05/12/13 0555 05/13/13 0500 05/14/13 0430  WBC 7.7  6.9 6.5 7.0 6.2  HGB 11.1* 11.0* 11.6* 11.5* 11.6*  HCT 33.7* 33.9* 36.6* 34.8* 36.5*  MCV 84.9 85.0 84.9 84.5 85.1  PLT 256 266 292 270 263   Scheduled Meds: . atorvastatin  40 mg Oral q1800  . docusate sodium  100 mg Oral BID  . doxazosin  8 mg Oral QHS  . fesoterodine  8 mg Oral Daily  . polyethylene glycol  17 g Oral BID  . Warfarin - Pharmacist Dosing Inpatient   Does not apply q1800   Continuous Infusions:   Principal Problem:   DVT, lower extremity Active Problems:   Renal stone   Junctional cardiac arrhythmia   Severe sinus bradycardia   Infection of urinary tract   Thrombocytopenia, unspecified   Gross hematuria  Time spent: 25  This note has been created with Surveyor, quantity. Any transcriptional errors are unintentional.   Marzetta Board, MD [facility] Pager [phone]. If 7 PM - 7 AM, please contact night-coverage at [email or web address], password [facility] 05/14/2013, 7:52 AM  LOS: 19 days

## 2013-05-14 NOTE — Consult Note (Addendum)
Admit date: 04/25/2013 Referring Physician  Dr. Renne Crigler Primary Physician  Dr. Alyson Ingles Primary Cardiologist  Emiah Pellicano-new Reason for Consultation  Sinus pause  HPI: 80 year old man with history of prostate cancer and abdominal aortic aneurysm, status post stent graft. He was admitted for problems with kidney stones. He had a percutaneous nephrolithotomy. Postoperatively, he had a junctional rhythm. He had no hemodynamic instability and his cardiac rhythm issues resolved. Later, he developed left leg swelling. He was found to have a deep vein thrombosis of his left/thigh leg venous system. He was anticoagulated but then developed hematuria. The anticoagulation was stopped. An IVC filter was placed.  On telemetry early this morning, was noted that he had a 3.6 second pause. It occurred at 512 this morning. The patient states that he was asleep. He remembers waking up at about 2:00 in the morning and then sleeping until about 6 in the morning. He denies any symptoms of lightheadedness or dizziness. When he is at home, he is active. He does a fair amount of walking without any dizziness, or lightheadedness. He denies chest discomfort or shortness of breath. He has never had any prior cardiac issues.     PMH:   Past Medical History  Diagnosis Date  . Hypertension   . BPH (benign prostatic hyperplasia)   . Hyperlipidemia   . Cancer 2008-2009    prostate TREATED WITH RADIATION  . AAA (abdominal aortic aneurysm) 04/2012    STENTING OF AAA IN [place]  . Junctional cardiac arrhythmia     Occurred postoperatively after urologic surgery  . Severe sinus bradycardia     Occurred postoperatively after urologic surgery     PSH:   Past Surgical History  Procedure Laterality Date  . Abdominal surgery      PART OF COLON REMOVED FOR DIVERTICULITIS  . Appendectomy    . Hernia repair    . Bladder surgery  2008    FOR BLADDER STONE  . Total knee arthroplasty  1990    left  . Abdominal aortic  aneurysm repair  04/2012  . Total knee arthroplasty Right 07/26/2012    Procedure: RIGHT TOTAL KNEE ARTHROPLASTY;  Surgeon: Tobi Bastos, MD;  Location: [facility];  Service: Orthopedics;  Laterality: Right;  . Nephrolithotomy Left 04/26/2013    Procedure: LEFT PERCUTANEOUS NEPHROLITHOTOMY ;  Surgeon: [name], MD;  Location: [facility];  Service: Urology;  Laterality: Left;  . Nephrolithotomy Left 05/03/2013    Procedure: 2ND STAGE LEFT PERCUTANEOUS NEPHROLITHOTOMY ;  Surgeon: [name], MD;  Location: [facility];  Service: Urology;  Laterality: Left;    Allergies:  Penicillins Prior to Admit Meds:   Prescriptions prior to admission  Medication Sig Dispense Refill  . acetaminophen (TYLENOL) 500 MG tablet Take 1,000 mg by mouth every 6 (six) hours as needed for mild pain or moderate pain.      Marland Kitchen aspirin 325 MG tablet Take 325 mg by mouth daily.      Marland Kitchen doxazosin (CARDURA) 8 MG tablet Take 8 mg by mouth at bedtime.       . Naphazoline-Glycerin-Zinc Sulf (CLEAR EYES MAXIMUM ITCHY EYE OP) Place 2 drops into the left eye as needed (for reddness in eye).      . simvastatin (ZOCOR) 80 MG tablet Take 80 mg by mouth every evening.      . tolterodine (DETROL LA) 4 MG 24 hr capsule Take 4 mg by mouth 2 (two) times daily.        Fam HX:  Family History  Problem Relation Age of Onset  . Lung cancer Father    Social HX:    History   Social History  . Marital Status: Married    Spouse Name: N/A    Number of Children: N/A  . Years of Education: N/A   Occupational History  . Not on file.   Social History Main Topics  . Smoking status: Former Smoker    Types: Cigarettes    Quit date: 03/15/1985  . Smokeless tobacco: Never Used  . Alcohol Use: No  . Drug Use: No  . Sexual Activity: No   Other Topics Concern  . Not on file   Social History Narrative  . No narrative on file     ROS:  All 11 ROS were addressed and are negative except what is stated in the HPI  Physical Exam: Blood pressure  114/76, pulse 87, temperature 98.1 F (36.7 C), temperature source Oral, resp. rate 20, height 6\' 2"  (1.88 m), weight 280 lb 13.9 oz (127.4 kg), SpO2 94.00%.    General: Well developed, well nourished, in no acute distress Head:    Normal cephalic and atramatic  Lungs:   No wheezing. Heart:   HRRR S1 S2  No JVD.  3/6 early systolic murmur Abdomen:  abdomen soft and non-tender Msk:   Normal strength and tone for age. Extremities:  No edema.  DP +1 Neuro: Alert and oriented X 3. Psych:  Normal affect, responds appropriately    Labs:   Lab Results  Component Value Date   WBC 6.2 05/14/2013   HGB 11.6* 05/14/2013   HCT 36.5* 05/14/2013   MCV 85.1 05/14/2013   PLT 263 05/14/2013    Recent Labs Lab 05/09/13 0447  NA 137  K 4.4  CL 101  CO2 26  BUN 24*  CREATININE 1.32  CALCIUM 9.1  GLUCOSE 96   No results found for this basename: PTT   Lab Results  Component Value Date   INR 2.87* 05/14/2013   INR 2.27* 05/13/2013   INR 1.70* 05/12/2013   No results found for this basename: CKTOTAL, CKMB, CKMBINDEX, TROPONINI     No results found for this basename: CHOL   No results found for this basename: HDL   No results found for this basename: LDLCALC   No results found for this basename: TRIG   No results found for this basename: CHOLHDL   No results found for this basename: LDLDIRECT      Radiology:  No results found.  EKG:  Most recent 12-lead ECG from February 18 shows normal sinus rhythm with a prolonged PR interval, occasional PVC, narrow QRS complex  ASSESSMENT: Sinus pauses during sleep; not on rate slowing medication; postoperative junctional rhythm  PLAN:  No symptoms of bradycardia noted by history. His pause occurred while he was asleep. Given his age and prolonged PR interval, he likely has some degree of conduction system disease; this is also supported by his postoperative rhythm issue. However, he does not have any indication for pacemaker placement at this time. I  asked him to continue to monitor for symptoms of dizziness or syncope. If he has any problems like this, he will let us know.  No contraindication to going to rehab today.  Vascular disease: He has no symptoms of coronary artery disease. He did have an abdominal aortic aneurysm which puts him at higher risk for having coronary artery disease. Watch for symptoms of ischemia. LDL target less than 100.  Continue  statin therapy.  Warfarin for DVT. It appears aspirin has been stopped. This is reasonable.  Cardiomyopathy: Ejection fraction listed at 40%. No symptoms of CHF at this time. He would benefit from ACE inhibitor therapy. Would still consider low-dose beta blocker therapy as well despite pause while sleeping. He will need cardiology outpatient followup.  Aortic stenosis: Mild to moderate by echocardiogram. No symptoms of severe stenosis.  Spoke at length to daughter.  Jettie Booze., MD  05/14/2013  9:10 AM

## 2013-05-14 NOTE — Progress Notes (Signed)
Pt had 3.48 sec pause on heart monitor. Pt asymptomatic, VSS. Dr. Emeterio Reeve notified. Will continue to monitor. Wendee Beavers [place]

## 2013-05-14 NOTE — Plan of Care (Signed)
Problem: Discharge Progression Outcomes Goal: Activity appropriate for discharge plan Outcome: Progressing F/U rehab at the nursing home

## 2013-05-14 NOTE — Discharge Summary (Signed)
Physician Discharge Summary  Patient ID: [name] MRN: [id number] DOB/AGE: [birth year] 80 y.o.  Admit date: 04/25/2013 Discharge date: 05/14/2013  Admission Diagnoses:left renal stone.  Discharge Diagnoses:  Principal Problem:   DVT, lower extremity Active Problems:   Renal stone   Junctional cardiac arrhythmia   Severe sinus bradycardia   Infection of urinary tract   Thrombocytopenia, unspecified   Gross hematuria   Aortic valve disorders   Other primary cardiomyopathies   Discharged Condition: good  Hospital Course: Mr. Vanvoorhis was initially admitted on 04/25/13 for a left PCNL on 04/26/13 and was started on antibiotics for a preexisting proteus infection.   He had partial stone removal on the 1st look and then was kept in house pending a second look procedure a week later.   He was managed with SCD's but developed an acute left DVT and was placed on anticoagulation but had hematuria and a [place] filter was placed.  Dr. Marin Olp was consulted and compression hose and anticoagulation was recommended.  He was placed on a heparin drip and warfarin and is now therapeutic.   He had some arrhythmias yesterday but was cleared for discharge by cardiology today. He will be sent to SNF for rehab  Consults: cardiology and Oncology and Hospitalist.  Significant Diagnostic studies  Venous Dopplers  Treatments: as noted above.   Discharge Exam: Blood pressure 114/76, pulse 87, temperature 98.1 F (36.7 C), temperature source Oral, resp. rate 20, height 6\' 2"  (1.88 m), weight 127.4 kg (280 lb 13.9 oz), SpO2 94.00%. General appearance: alert and no distress GI: soft, non-tender; bowel sounds normal; no masses,  no organomegaly Extremities: He has reduced LLE edema.   Disposition: Skilled nursing  Discharge Orders   Future Appointments Provider Department Dept Phone   07/12/2013 8:30 AM Casandra Doffing, MD [place] [phone]   Future Orders Complete By Expires    Discontinue IV  As directed        Medication List         acetaminophen 500 MG tablet  Commonly known as:  TYLENOL  Take 1,000 mg by mouth every 6 (six) hours as needed for mild pain or moderate pain.     aspirin 325 MG tablet  Take 325 mg by mouth daily.     CLEAR EYES MAXIMUM ITCHY EYE OP  Place 2 drops into the left eye as needed (for reddness in eye).     doxazosin 8 MG tablet  Commonly known as:  CARDURA  Take 8 mg by mouth at bedtime.     simvastatin 80 MG tablet  Commonly known as:  ZOCOR  Take 80 mg by mouth every evening.     tolterodine 4 MG 24 hr capsule  Commonly known as:  DETROL LA  Take 4 mg by mouth 2 (two) times daily.     traMADol 50 MG tablet  Commonly known as:  ULTRAM  Take 1 tablet (50 mg total) by mouth every 6 (six) hours as needed for moderate pain.           Follow-up Information   Follow up with Malka So, MD On 05/10/2013. (215)    Specialty:  Urology   Contact information:   [facility]  [place] [address] [phone]       Follow up with [place].   Contact information:   [address] [place] [address] [phone]  Call Malka So, MD. (call office for an appt for 2-3 weeks if not already scheduled. )    Specialty:  Urology   Contact information:   [facility]  [place] [address] [phone]       Signed: Malka So 05/14/2013, 11:18 AM

## 2013-05-14 NOTE — Progress Notes (Signed)
Patient discharged to [facility] for rehab via ambulance. Discharge packet prepared by CSW and given to EMS for transport. Report called to Marcetia-LPN at the facility. Surgical incision on left flank and right neck, clean and intact, no sign of infection or irritation noted, No other  Wound noted.

## 2013-05-14 NOTE — Progress Notes (Signed)
ANTICOAGULATION CONSULT NOTE - Follow Up Consult  Pharmacy Consult for Heparin / Warfarin Indication: DVT  Allergies  Allergen Reactions  . Penicillins Other (See Comments)    HIVES     Patient Measurements: Height: 6\' 2"  (188 cm) Weight: 280 lb 13.9 oz (127.4 kg) IBW/kg (Calculated) : 82.2  Vital Signs: Temp: 98.1 F (36.7 C) (03/02 0456) Temp src: Oral (03/02 0456) BP: 114/76 mmHg (03/02 0456) Pulse Rate: 87 (03/02 0456)  Labs:  Recent Labs  05/12/13 0555 05/13/13 0500 05/14/13 0430  HGB 11.6* 11.5* 11.6*  HCT 36.6* 34.8* 36.5*  PLT 292 270 263  LABPROT 19.5* 24.3* 29.1*  INR 1.70* 2.27* 2.87*  HEPARINUNFRC 0.42 0.49 0.63    Estimated Creatinine Clearance: 62.3 ml/min (by C-G formula based on Cr of 1.32).   Medications:  Infusions:  . heparin 1,650 Units/hr (05/13/13 2118)    Assessment: 3 yoM with PMHx HTN, BPH, hx prostate cancer and radiation, underwent left percutaneous nephrolithotomy and then found to have LLE DVT with propagation.  Pt initially started on lovenox and warfarn which was subsequently held due to hematuria.  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  Also has compression stocking on left leg.   3/2: D6 warfarin/heparin overlap, INR therapeutic x 24 hours, can d/c heparin  AM heparin level = 0.63, remains in therapeutic range INR = 2.87, now within therapeutic range   CBC stable.  No bleeding or infusion site issues per RN.  Heparin dosing weight = 110kg.   Patient eating 100% of meals  Discharge planning: Noted planned discharge to SNF on 3/2 Patient's INR slow at first to respond to warfarin dosing, now with large jumps - Monitor outpatient INR closely given INR continue to trend up Anticipate home warfarin dosed with 5mg  tablets Would recommend home dose to be 5mg  (1 tablet) daily INR should be checked Tues (3/4) and Fri (3/7) next week for continued  dosing  Goal of Therapy:  Heparin level 0.3-0.7 units/ml Monitor platelets by anticoagulation protocol: Yes INR 2-3   Plan:  - Discontinue IV heparin today, d/c heparin levels - will f/u with MD - Coumadin 5 mg x 1 tonight at 1800  - Daily PT/INR  Jarron Curley, Gaye Alken PharmD Pager #: [phone] 7:27 AM 05/14/2013

## 2013-05-14 NOTE — Progress Notes (Signed)
Patient is set to discharge to [facility] SNF today. Patient & daughter, [name] aware. Discharge packet in [place], [place] aware. [facility] scheduled for 12:45pm pickup (Service Request Id: [id number]).   Clinical Social Work Department CLINICAL SOCIAL WORK PLACEMENT NOTE 05/14/2013  Patient:  [name], [name]  Account Number:  [id number] Admit date:  04/25/2013  Clinical Social Worker:  Renold Genta  Date/time:  05/07/2013 03:25 PM  Clinical Social Work is seeking post-discharge placement for this patient at the following level of care:   SKILLED NURSING   (*CSW will update this form in Epic as items are completed)   05/07/2013  Patient/family provided with [place] Department of Clinical Social Work's list of facilities offering this level of care within the geographic area requested by the patient (or if unable, by the patient's family).  05/07/2013  Patient/family informed of their freedom to choose among providers that offer the needed level of care, that participate in Medicare, Medicaid or managed care program needed by the patient, have an available bed and are willing to accept the patient.  05/07/2013  Patient/family informed of [facility]' ownership interest in [facility], as well as of the fact that they are under no obligation to receive care at this facility.  PASARR submitted to EDS on 05/07/2013 PASARR number received from EDS on 05/07/2013  FL2 transmitted to all facilities in geographic area requested by pt/family on  05/07/2013 FL2 transmitted to all facilities within larger geographic area on   Patient informed that his/her managed care company has contracts with or will negotiate with  certain facilities, including the following:     Patient/family informed of bed offers received:  05/09/2013 Patient chooses bed at [facility] Physician recommends and patient chooses bed at    Patient to be transferred to [facility] on  05/14/2013 Patient to  be transferred to facility by PTAR  The following physician request were entered in Epic:   Additional Comments:   Raynaldo Opitz, [place] Worker cell #: [phone]

## 2013-05-15 ENCOUNTER — Encounter: Payer: Self-pay | Admitting: Adult Health

## 2013-05-15 ENCOUNTER — Non-Acute Institutional Stay (SKILLED_NURSING_FACILITY): Payer: Medicare Other | Admitting: Adult Health

## 2013-05-15 ENCOUNTER — Other Ambulatory Visit: Payer: Self-pay | Admitting: *Deleted

## 2013-05-15 DIAGNOSIS — Z7901 Long term (current) use of anticoagulants: Secondary | ICD-10-CM

## 2013-05-15 DIAGNOSIS — Z5181 Encounter for therapeutic drug level monitoring: Secondary | ICD-10-CM

## 2013-05-15 DIAGNOSIS — R3915 Urgency of urination: Secondary | ICD-10-CM

## 2013-05-15 DIAGNOSIS — I82409 Acute embolism and thrombosis of unspecified deep veins of unspecified lower extremity: Secondary | ICD-10-CM

## 2013-05-15 DIAGNOSIS — I10 Essential (primary) hypertension: Secondary | ICD-10-CM

## 2013-05-15 DIAGNOSIS — N200 Calculus of kidney: Secondary | ICD-10-CM

## 2013-05-15 MED ORDER — TRAMADOL HCL 50 MG PO TABS: 50.0000 mg | ORAL_TABLET | Freq: Four times a day (QID) | ORAL | Status: DC | PRN

## 2013-05-15 NOTE — Progress Notes (Signed)
Patient ID: [name], male   DOB: 1929-07-26, 80 y.o.   MRN: [id number]     [facility]  Allergies  Allergen Reactions  . Penicillins Other (See Comments)    HIVES     Chief Complaint  Patient presents with  . Hospitalization Follow-up    HPI:   He has been hospitalized for left subcutaneous nephrolithotomy due to renal stones. During his hospitalization he developed left lower extremity dvt's which required a placement of a IVC filter. He is on long term coumadin use. He is here for short term rehab with his goal to return home in the near future.     Past Medical History  Diagnosis Date  . Hypertension   . BPH (benign prostatic hyperplasia)   . Hyperlipidemia   . Cancer 2008-2009    prostate TREATED WITH RADIATION  . AAA (abdominal aortic aneurysm) 04/2012    STENTING OF AAA IN [place]  . Junctional cardiac arrhythmia     Occurred postoperatively after urologic surgery  . Severe sinus bradycardia     Occurred postoperatively after urologic surgery  . Aortic valve disorders   . Other primary cardiomyopathies     Past Surgical History  Procedure Laterality Date  . Abdominal surgery      PART OF COLON REMOVED FOR DIVERTICULITIS  . Appendectomy    . Hernia repair    . Bladder surgery  2008    FOR BLADDER STONE  . Total knee arthroplasty  1990    left  . Abdominal aortic aneurysm repair  04/2012  . Total knee arthroplasty Right 07/26/2012    Procedure: RIGHT TOTAL KNEE ARTHROPLASTY;  Surgeon: Tobi Bastos, MD;  Location: [facility];  Service: Orthopedics;  Laterality: Right;  . Nephrolithotomy Left 04/26/2013    Procedure: LEFT PERCUTANEOUS NEPHROLITHOTOMY ;  Surgeon: [name], MD;  Location: [facility];  Service: Urology;  Laterality: Left;  . Nephrolithotomy Left 05/03/2013    Procedure: 2ND STAGE LEFT PERCUTANEOUS NEPHROLITHOTOMY ;  Surgeon: [name], MD;  Location: [facility];  Service: Urology;  Laterality: Left;    VITAL SIGNS BP 130/61  Pulse 68   Ht 6\' 2"  (1.88 m)  Wt 289 lb (131.09 kg)  BMI 37.09 kg/m2   Patient's Medications  New Prescriptions   No medications on file  Previous Medications   ACETAMINOPHEN (TYLENOL) 500 MG TABLET    Take 1,000 mg by mouth every 6 (six) hours as needed for mild pain or moderate pain.   ASPIRIN 325 MG TABLET    Take 325 mg by mouth daily.   DOXAZOSIN (CARDURA) 8 MG TABLET    Take 8 mg by mouth at bedtime.    NAPHAZOLINE-GLYCERIN-ZINC SULF (CLEAR EYES MAXIMUM ITCHY EYE OP)    Place 2 drops into the left eye as needed (for reddness in eye).   SIMVASTATIN (ZOCOR) 80 MG TABLET    Take 80 mg by mouth every evening.   TOLTERODINE (DETROL LA) 4 MG 24 HR CAPSULE    Take 4 mg by mouth 2 (two) times daily.    TRAMADOL (ULTRAM) 50 MG TABLET    Take 1 tablet (50 mg total) by mouth every 6 (six) hours as needed for moderate pain.   WARFARIN (COUMADIN) 5 MG TABLET    Take 1 tablet (5 mg total) by mouth daily.  Modified Medications   No medications on file  Discontinued Medications   No medications on file    SIGNIFICANT DIAGNOSTIC EXAMS  03-23-13: -  Left ventricle: The cavity size was normal. Wall thickness was increased increased in a pattern of mild to moderate LVH. The estimated ejection fraction was 40%, mildly reduced.- Aortic valve: Severely calcified annulus. Severely thickened leaflets. There was mild to moderate stenosis. Morphologically the valve appears at least moderately stenotic, lower gradients may be related to decreased ejection fraction.- Left atrium: The atrium was severely dilated.  04-27-13: ct of abdomen: Multiple residual left renal calculi as described above. Other findings involving the left kidney consistent with post-procedure appearance  Bilateral lower lobe consolidation.  05-03-13  KUB: Percutaneous nephrostomy with removal of left renal stone performed by Dr. Jeffie Pollock.   05-03-13: bilateral lower extremity doppler: No evidence of deep vein thrombosis involving the right lower  extremity. - Findings consistent with extensive, occlusive, acute deep vein thrombosis involving the left common femoral vein, left profunda femoris vein, left femoral vein, left popliteal vein, left posterial tibial vein, and left peroneal vein. - . Incidental findings are consistent with: Baker's Cyst on the left. - No evidence of Baker's cyst on the right.   05-05-13: bilateral lower extremity doppler: Findings consistent with acute deep vein thrombosis involving the distal popliteal , femoral, profunda, saphenofemoral junction, and common femoral veins of the left lower extremity. The thrombus noted in the common femoral has propagated further proximally since found in the study of 05/03/2013. - There has been propagation of a superficial thrombus now noted in the mid to proximal greater saphenous vein. - There is no evidence of propagation to the right lower extremity at this time. - No evidence of Baker's cyst on the left.     LABS REVIEWED:   04-25-13: wbc 5.9; hgb 13.7; hct 42.9; mcv 85.5 plt 126; gluocse 89; bun 20; creat 1.21; k+4.8; na++140  04-28-13: wbc 12.9; hgb 13.1; hct 39.1; mcv 85.4;plt 101 05-06-13: glucose 89; bun 26; creat 1.35; k+4.6; na++ 139 05-09-13: wbc 8.3; hgb 10.9; hct 33.8; mcv84.9; plt 261; glucose 96; bun 24; creat 1.32; k+4.4; na++137 05-14-13: wbc 6.2; hgb 11.6; hct 35.6; mcv 85.1;plt 263; glucose 99; bun 16; creat 1.19; k+4.0; na++ 138 liver normal albumin 2.5; mag 2.0; phos 3.6   Review of Systems  Constitutional: Negative for fever, malaise/fatigue and diaphoresis.  Eyes: Negative for blurred vision.  Respiratory: Negative for cough, shortness of breath and wheezing.   Cardiovascular: Negative for chest pain, palpitations and leg swelling.  Gastrointestinal: Negative for heartburn, abdominal pain, diarrhea and constipation.  Genitourinary: Negative for dysuria, hematuria and flank pain.  Musculoskeletal: Negative for back pain, joint pain and myalgias.    Skin: Negative.   Neurological: Negative for dizziness, weakness and headaches.  Psychiatric/Behavioral: Negative for depression. The patient is not nervous/anxious.      Physical Exam  Constitutional: He is oriented to person, place, and time. He appears well-developed and well-nourished. No distress.  Overweight   Neck: Neck supple. No JVD present.  Cardiovascular: Normal rate, regular rhythm and intact distal pulses.   Respiratory: Effort normal and breath sounds normal. No respiratory distress. He has no wheezes.  GI: Soft. Bowel sounds are normal. He exhibits no distension. There is no tenderness.  Musculoskeletal: Normal range of motion. He exhibits no edema.  Neurological: He is alert and oriented to person, place, and time.  Skin: Skin is warm and dry. He is not diaphoretic.  Psychiatric: He has a normal mood and affect.      ASSESSMENT/ PLAN:  1. Left lower extremity dvt: has filter placement: will continue asa 325  mg daily and will continue coumadin 5 mg daily will get an inr in the am and will monitor his status. He will require coumadin therapy for 6 months from 05-06-13.   2. Dyslipidemia: will continue zocor 80 mg daily   3. Hypertension: will continue cardura 8 mg daily and will monitor status   4. Renal stone: is presently stable; no hematuria at this time; will not make changes will monitor his status  5. Urinary urgency: will continue detrol LA 4 mg twice daily    Time spent with patient 50 minutes    Ok Edwards NP [facility]  Contact [phone] Monday through Friday 8am- 5pm  After hours call [phone]

## 2013-05-15 NOTE — Telephone Encounter (Signed)
[facility] 

## 2013-05-16 ENCOUNTER — Non-Acute Institutional Stay (SKILLED_NURSING_FACILITY): Payer: Medicare Other | Admitting: Adult Health

## 2013-05-16 ENCOUNTER — Telehealth: Payer: Self-pay | Admitting: Hematology & Oncology

## 2013-05-16 ENCOUNTER — Encounter: Payer: Self-pay | Admitting: Adult Health

## 2013-05-16 DIAGNOSIS — Z7901 Long term (current) use of anticoagulants: Secondary | ICD-10-CM

## 2013-05-16 DIAGNOSIS — I82409 Acute embolism and thrombosis of unspecified deep veins of unspecified lower extremity: Secondary | ICD-10-CM

## 2013-05-16 DIAGNOSIS — Z5181 Encounter for therapeutic drug level monitoring: Secondary | ICD-10-CM

## 2013-05-16 NOTE — Progress Notes (Signed)
Patient ID: [name], male   DOB: 05/07/1929, 80 y.o.   MRN: [id number]     [facility]  Allergies  Allergen Reactions  . Penicillins Other (See Comments)    HIVES      Chief Complaint  Patient presents with  . Acute Visit    coumadin management     HPI:  He will be on coumadin therapy for 6 months from May 06, 2013. His inr today is 3.6 and he has been on coumadin 5 mg daily. He has been tolerating coumadin therapy without difficulty. There are no concerns being voiced by the nursing staff at this time.    Past Medical History  Diagnosis Date  . Hypertension   . BPH (benign prostatic hyperplasia)   . Hyperlipidemia   . Cancer 2008-2009    prostate TREATED WITH RADIATION  . AAA (abdominal aortic aneurysm) 04/2012    STENTING OF AAA IN [place]  . Junctional cardiac arrhythmia     Occurred postoperatively after urologic surgery  . Severe sinus bradycardia     Occurred postoperatively after urologic surgery  . Aortic valve disorders   . Other primary cardiomyopathies     Past Surgical History  Procedure Laterality Date  . Abdominal surgery      PART OF COLON REMOVED FOR DIVERTICULITIS  . Appendectomy    . Hernia repair    . Bladder surgery  2008    FOR BLADDER STONE  . Total knee arthroplasty  1990    left  . Abdominal aortic aneurysm repair  04/2012  . Total knee arthroplasty Right 07/26/2012    Procedure: RIGHT TOTAL KNEE ARTHROPLASTY;  Surgeon: Tobi Bastos, MD;  Location: [facility];  Service: Orthopedics;  Laterality: Right;  . Nephrolithotomy Left 04/26/2013    Procedure: LEFT PERCUTANEOUS NEPHROLITHOTOMY ;  Surgeon: [name], MD;  Location: [facility];  Service: Urology;  Laterality: Left;  . Nephrolithotomy Left 05/03/2013    Procedure: 2ND STAGE LEFT PERCUTANEOUS NEPHROLITHOTOMY ;  Surgeon: [name], MD;  Location: [facility];  Service: Urology;  Laterality: Left;    VITAL SIGNS BP 100/58  Pulse 68  Ht 6\' 2"  (1.88 m)  Wt 260 lb 9.6 oz  (118.207 kg)  BMI 33.44 kg/m2   Patient's Medications  New Prescriptions   No medications on file  Previous Medications   ACETAMINOPHEN (TYLENOL) 500 MG TABLET    Take 1,000 mg by mouth every 6 (six) hours as needed for mild pain or moderate pain.   ASPIRIN 325 MG TABLET    Take 325 mg by mouth daily.   DOXAZOSIN (CARDURA) 8 MG TABLET    Take 8 mg by mouth at bedtime.    NAPHAZOLINE-GLYCERIN-ZINC SULF (CLEAR EYES MAXIMUM ITCHY EYE OP)    Place 2 drops into the left eye as needed (for reddness in eye).   SIMVASTATIN (ZOCOR) 80 MG TABLET    Take 80 mg by mouth every evening.   TOLTERODINE (DETROL LA) 4 MG 24 HR CAPSULE    Take 4 mg by mouth 2 (two) times daily.    TRAMADOL (ULTRAM) 50 MG TABLET    Take 1 tablet (50 mg total) by mouth every 6 (six) hours as needed for moderate pain.   WARFARIN (COUMADIN) 5 MG TABLET    Take 1 tablet (5 mg total) by mouth daily.  Modified Medications   No medications on file  Discontinued Medications   No medications on file    SIGNIFICANT DIAGNOSTIC EXAMS  03-23-13: - Left ventricle: The cavity size was normal. Wall thickness was increased increased in a pattern of mild to moderate LVH. The estimated ejection fraction was 40%, mildly reduced.- Aortic valve: Severely calcified annulus. Severely thickened leaflets. There was mild to moderate stenosis. Morphologically the valve appears at least moderately stenotic, lower gradients may be related to decreased ejection fraction.- Left atrium: The atrium was severely dilated.  04-27-13: ct of abdomen: Multiple residual left renal calculi as described above. Other findings involving the left kidney consistent with post-procedure appearance  Bilateral lower lobe consolidation.  05-03-13  KUB: Percutaneous nephrostomy with removal of left renal stone performed by Dr. Jeffie Pollock.   05-03-13: bilateral lower extremity doppler: No evidence of deep vein thrombosis involving the right lower extremity. - Findings consistent  with extensive, occlusive, acute deep vein thrombosis involving the left common femoral vein, left profunda femoris vein, left femoral vein, left popliteal vein, left posterial tibial vein, and left peroneal vein. - . Incidental findings are consistent with: Baker's Cyst on the left. - No evidence of Baker's cyst on the right.   05-05-13: bilateral lower extremity doppler: Findings consistent with acute deep vein thrombosis involving the distal popliteal , femoral, profunda, saphenofemoral junction, and common femoral veins of the left lower extremity. The thrombus noted in the common femoral has propagated further proximally since found in the study of 05/03/2013. - There has been propagation of a superficial thrombus now noted in the mid to proximal greater saphenous vein. - There is no evidence of propagation to the right lower extremity at this time. - No evidence of Baker's cyst on the left.     LABS REVIEWED:   04-25-13: wbc 5.9; hgb 13.7; hct 42.9; mcv 85.5 plt 126; gluocse 89; bun 20; creat 1.21; k+4.8; na++140  04-28-13: wbc 12.9; hgb 13.1; hct 39.1; mcv 85.4;plt 101 05-06-13: glucose 89; bun 26; creat 1.35; k+4.6; na++ 139 05-09-13: wbc 8.3; hgb 10.9; hct 33.8; mcv84.9; plt 261; glucose 96; bun 24; creat 1.32; k+4.4; na++137 05-14-13: wbc 6.2; hgb 11.6; hct 35.6; mcv 85.1;plt 263; glucose 99; bun 16; creat 1.19; k+4.0; na++ 138 liver normal albumin 2.5; mag 2.0; phos 3.6   Review of Systems  Constitutional: Negative for fever, malaise/fatigue and diaphoresis.  Eyes: Negative for blurred vision.  Respiratory: Negative for cough, shortness of breath and wheezing.   Cardiovascular: Negative for chest pain, palpitations and leg swelling.  Gastrointestinal: Negative for heartburn, abdominal pain, diarrhea and constipation.  Genitourinary: Negative for dysuria, hematuria and flank pain.  Musculoskeletal: Negative for back pain, joint pain and myalgias.  Skin: Negative.   Neurological:  Negative for dizziness, weakness and headaches.  Psychiatric/Behavioral: Negative for depression. The patient is not nervous/anxious.      Physical Exam  Constitutional: He is oriented to person, place, and time. He appears well-developed and well-nourished. No distress.  Overweight   Neck: Neck supple. No JVD present.  Cardiovascular: Normal rate, regular rhythm and intact distal pulses.   Respiratory: Effort normal and breath sounds normal. No respiratory distress. He has no wheezes.  GI: Soft. Bowel sounds are normal. He exhibits no distension. There is no tenderness.  Musculoskeletal: Normal range of motion. He exhibits no edema.  Neurological: He is alert and oriented to person, place, and time.  Skin: Skin is warm and dry. He is not diaphoretic.  Psychiatric: He has a normal mood and affect.       ASSESSMENT/ PLAN:  1. dvt left leg/anticoagulation management: is status post ivc filter  placement. For his inr of 3.6 will hold his coumadin 5 mg today and will check inr in the am. Will monitor his status.      Ok Edwards NP [facility]  Contact [phone] Monday through Friday 8am- 5pm  After hours call [phone]

## 2013-05-16 NOTE — Telephone Encounter (Signed)
daughter called wanted to schedule hospital follow up. I left message with RN when to schedule

## 2013-05-16 NOTE — Telephone Encounter (Signed)
Pt aware of 3-6 appointment

## 2013-05-17 ENCOUNTER — Non-Acute Institutional Stay (SKILLED_NURSING_FACILITY): Payer: Medicare Other | Admitting: Internal Medicine

## 2013-05-17 ENCOUNTER — Encounter: Payer: Self-pay | Admitting: Internal Medicine

## 2013-05-17 DIAGNOSIS — I82409 Acute embolism and thrombosis of unspecified deep veins of unspecified lower extremity: Secondary | ICD-10-CM

## 2013-05-17 DIAGNOSIS — R791 Abnormal coagulation profile: Secondary | ICD-10-CM

## 2013-05-17 DIAGNOSIS — I10 Essential (primary) hypertension: Secondary | ICD-10-CM

## 2013-05-17 DIAGNOSIS — E785 Hyperlipidemia, unspecified: Secondary | ICD-10-CM

## 2013-05-17 DIAGNOSIS — N200 Calculus of kidney: Secondary | ICD-10-CM

## 2013-05-17 DIAGNOSIS — R32 Unspecified urinary incontinence: Secondary | ICD-10-CM

## 2013-05-17 NOTE — Progress Notes (Signed)
Patient ID: [name], male   DOB: 1929-12-01, 80 y.o.   MRN: [id number]     [facility]   PCP: [name], MD  Code Status: full code  Allergies  Allergen Reactions  . Penicillins Other (See Comments)    HIVES     Chief Complaint: new admit  HPI:  80 y/o male patient is here for STR after hospital admission with kidney stones and underwent percutaneous nephrolithotomy. He then was diagnosed to have DVT in left leg. He was started on coumadin which led to hematuria. It was stopped and then he had IVC filter placed. Oncology was consulted and recommended coumadin for 6 months with IVC filter. He has history of prostate cancer and abdominal aortic aneurysm s/p stent graft.  He was seen in his room today. He is in no distress. No further hematuria. He has worked well with therapy team here and has made good progress. I see him walking in the hallway with a walker. He denies any complaints this visit.  Review of Systems:  Constitutional: Negative for fever, chills, weight loss, malaise/fatigue and diaphoresis.  HENT: Negative for congestion, hearing loss and sore throat.   Eyes: Negative for eye pain, blurred vision, double vision and discharge.  Respiratory: Negative for cough, sputum production, shortness of breath and wheezing.   Cardiovascular: Negative for chest pain, palpitations, orthopnea and leg swelling.  Gastrointestinal: Negative for heartburn, nausea, vomiting, abdominal pain, diarrhea and constipation.  Genitourinary: Negative for dysuria, urgency, frequency, hematuria and flank pain.  Musculoskeletal: Negative for back pain, falls, joint pain and myalgias.  Skin: Negative for itching and rash.  Neurological: Negative for weakness,dizziness, tingling, focal weakness and headaches.  Psychiatric/Behavioral: Negative for depression and memory loss. The patient is not nervous/anxious.     Past Medical History  Diagnosis Date  . Hypertension   . BPH  (benign prostatic hyperplasia)   . Hyperlipidemia   . Cancer 2008-2009    prostate TREATED WITH RADIATION  . AAA (abdominal aortic aneurysm) 04/2012    STENTING OF AAA IN [place]  . Junctional cardiac arrhythmia     Occurred postoperatively after urologic surgery  . Severe sinus bradycardia     Occurred postoperatively after urologic surgery  . Aortic valve disorders   . Other primary cardiomyopathies    Past Surgical History  Procedure Laterality Date  . Abdominal surgery      PART OF COLON REMOVED FOR DIVERTICULITIS  . Appendectomy    . Hernia repair    . Bladder surgery  2008    FOR BLADDER STONE  . Total knee arthroplasty  1990    left  . Abdominal aortic aneurysm repair  04/2012  . Total knee arthroplasty Right 07/26/2012    Procedure: RIGHT TOTAL KNEE ARTHROPLASTY;  Surgeon: Tobi Bastos, MD;  Location: [facility];  Service: Orthopedics;  Laterality: Right;  . Nephrolithotomy Left 04/26/2013    Procedure: LEFT PERCUTANEOUS NEPHROLITHOTOMY ;  Surgeon: [name], MD;  Location: [facility];  Service: Urology;  Laterality: Left;  . Nephrolithotomy Left 05/03/2013    Procedure: 2ND STAGE LEFT PERCUTANEOUS NEPHROLITHOTOMY ;  Surgeon: [name], MD;  Location: [facility];  Service: Urology;  Laterality: Left;   Social History:   reports that he quit smoking about 28 years ago. His smoking use included Cigarettes. He smoked 0.00 packs per day. He has never used smokeless tobacco. He reports that he does not drink alcohol or use illicit drugs.  Family History  Problem  Relation Age of Onset  . Lung cancer Father     Medications: Patient's Medications  New Prescriptions   No medications on file  Previous Medications   ACETAMINOPHEN (TYLENOL) 500 MG TABLET    Take 1,000 mg by mouth every 6 (six) hours as needed for mild pain or moderate pain.   ASPIRIN 325 MG TABLET    Take 325 mg by mouth daily.   DOXAZOSIN (CARDURA) 8 MG TABLET    Take 8 mg by mouth at bedtime.     NAPHAZOLINE-GLYCERIN-ZINC SULF (CLEAR EYES MAXIMUM ITCHY EYE OP)    Place 2 drops into the left eye as needed (for reddness in eye).   SIMVASTATIN (ZOCOR) 80 MG TABLET    Take 80 mg by mouth every evening.   TOLTERODINE (DETROL LA) 4 MG 24 HR CAPSULE    Take 4 mg by mouth 2 (two) times daily.    TRAMADOL (ULTRAM) 50 MG TABLET    Take 1 tablet (50 mg total) by mouth every 6 (six) hours as needed for moderate pain.   WARFARIN (COUMADIN) 5 MG TABLET    Take 1 tablet (5 mg total) by mouth daily.  Modified Medications   No medications on file  Discontinued Medications   No medications on file     Physical Exam: BP 128/69  Pulse 69  Temp(Src) 97.1 F (36.2 C)  Resp 18  SpO2 99%  General- elderly male in no acute distress Head- atraumatic, normocephalic Eyes- PERRLA, EOMI, no pallor, no icterus, no discharge Neck- no lymphadenopathy,no jugular vein distension Cardiovascular- normal s1,s2, no murmurs/ rubs/ gallops Respiratory- bilateral clear to auscultation, no wheeze, no rhonchi, no crackles, no use of accessory muscles Abdomen- bowel sounds present, soft, non tender Musculoskeletal- able to move all 4 extremities, no spinal and paraspinal tenderness, using a walker Neurological- no focal deficit, normal reflexes, normal muscle strength, normal sensation to fine touch and vibration Skin- warm and dry Psychiatry- alert and oriented to person, place and time, normal mood and affect   Labs reviewed: Basic Metabolic Panel:  Recent Labs  05/06/13 0437 05/09/13 0447 05/14/13 0845  NA 139 137 138  K 4.6 4.4 4.0  CL 102 101 102  CO2 28 26 26   GLUCOSE 89 96 99  BUN 26* 24* 16  CREATININE 1.35 1.32 1.19  CALCIUM 8.9 9.1 9.5  MG  --   --  2.0  PHOS  --   --  3.6   Liver Function Tests:  Recent Labs  05/20/12 0617 07/18/12 1405 05/14/13 0845  AST 15 18 31   ALT 19 12 25   ALKPHOS 52 59 63  BILITOT 0.5 0.3 0.4  PROT 6.3 6.2 5.9*  ALBUMIN 2.4* 3.1* 2.5*   No results found  for this basename: LIPASE, AMYLASE,  in the last 8760 hours No results found for this basename: AMMONIA,  in the last 8760 hours CBC:  Recent Labs  05/19/12 1232  05/12/13 0555 05/13/13 0500 05/14/13 0430  WBC 13.4*  < > 6.5 7.0 6.2  NEUTROABS 11.6*  --   --   --   --   HGB 13.2  < > 11.6* 11.5* 11.6*  HCT 39.8  < > 36.6* 34.8* 36.5*  MCV 85.6  < > 84.9 84.5 85.1  PLT 187  < > 292 270 263  < > = values in this interval not displayed.  Radiological Exams: 03-23-13: - Left ventricle: The cavity size was normal. Wall thickness was increased increased in a pattern  of mild to moderate LVH. The estimated ejection fraction was 40%, mildly reduced.- Aortic valve: Severely calcified annulus. Severely thickened leaflets. There was mild to moderate stenosis. Morphologically the valve appears at least moderately stenotic, lower gradients may be related to decreased ejection fraction.- Left atrium: The atrium was severely dilated.  04-27-13: ct of abdomen: Multiple residual left renal calculi as described above. Other findings involving the left kidney consistent with post-procedure appearance  Bilateral lower lobe consolidation.  05-03-13  KUB: Percutaneous nephrostomy with removal of left renal stone performed by Dr. Jeffie Pollock.   05-03-13: bilateral lower extremity doppler: No evidence of deep vein thrombosis involving the right lower extremity. - Findings consistent with extensive, occlusive, acute deep vein thrombosis involving the left common femoral vein, left profunda femoris vein, left femoral vein, left popliteal vein, left posterial tibial vein, and left peroneal vein. - . Incidental findings are consistent with: Baker's Cyst on the left. - No evidence of Baker's cyst on the right.   05-05-13: bilateral lower extremity doppler: Findings consistent with acute deep vein thrombosis involving the distal popliteal , femoral, profunda, saphenofemoral junction, and common femoral veins of the left lower  extremity. The thrombus noted in the common femoral has propagated further proximally since found in the study of 05/03/2013. - There has been propagation of a superficial thrombus now noted in the mid to proximal greater saphenous vein. - There is no evidence of propagation to the right lower extremity at this time. - No evidence of Baker's cyst on the left.   Assessment/plan  Left lower extremity dvt- has IVC filter placed. inr today supratherapeutic at 3.5. Will hold coumadin and recheck inr 05/18/13. Goal inr 2-3. Will d/c aspirin to reduce risk for bleeding. Continue coumadin for 6 months  supratherapeutic inr- hold coumadin today and check inr 05/18/13. Monitor for signs of bleeding. Goal inr 2-3  Renal stone- s/p nephrolithotomy. No urinary complaints at present. Monitor for hematuria. Has follow up with urology  HTN- bp stable. Continue cardura 8 mg daily  Hyperlipidemia- continue zocor 80 mg daily  Urinary incontinence- continue detrol 4 mg bid for now    Family/ staff Communication: reviewed care plan with patient and nursing supervisor    Goals of care: STR   Labs/tests ordered: inr    Blanchie Serve, MD  [place] [phone] (Monday-Friday 8 am - 5 pm) [phone] (afterhours)

## 2013-05-18 ENCOUNTER — Ambulatory Visit (HOSPITAL_BASED_OUTPATIENT_CLINIC_OR_DEPARTMENT_OTHER): Payer: Medicare Other | Admitting: Hematology & Oncology

## 2013-05-18 ENCOUNTER — Other Ambulatory Visit: Payer: Medicare Other | Admitting: Lab

## 2013-05-18 ENCOUNTER — Non-Acute Institutional Stay (SKILLED_NURSING_FACILITY): Payer: Medicare Other | Admitting: Adult Health

## 2013-05-18 ENCOUNTER — Ambulatory Visit: Payer: Medicare Other | Admitting: Hematology & Oncology

## 2013-05-18 ENCOUNTER — Encounter: Payer: Self-pay | Admitting: Hematology & Oncology

## 2013-05-18 VITALS — BP 153/87 | HR 101 | Temp 98.0°F | Resp 20 | Ht 72.0 in | Wt 259.0 lb

## 2013-05-18 DIAGNOSIS — I82409 Acute embolism and thrombosis of unspecified deep veins of unspecified lower extremity: Secondary | ICD-10-CM

## 2013-05-18 DIAGNOSIS — Z7901 Long term (current) use of anticoagulants: Secondary | ICD-10-CM

## 2013-05-18 DIAGNOSIS — N200 Calculus of kidney: Secondary | ICD-10-CM

## 2013-05-18 DIAGNOSIS — R29898 Other symptoms and signs involving the musculoskeletal system: Secondary | ICD-10-CM

## 2013-05-18 MED ORDER — WARFARIN SODIUM 5 MG PO TABS: ORAL_TABLET | ORAL | Status: DC

## 2013-05-21 NOTE — Progress Notes (Signed)
Hematology and Oncology Follow Up Visit  [name] [id number] June 29, 1929 80 y.o. 05/21/2013   Principle Diagnosis:   DVT of the left leg  Current Therapy:    Coumadin to maintain INR within 2-3     Interim History:  Mr.  [name] is in for his first office visit. I saw him in the hospital. He had kidney surgery for kidney stones. A couple days after surgery, he developed swelling in the left leg. His son have a fairly extensive DVT. We put him on heparin. There was concern about him having bleeding. Had a filter put in the IVC.  He now is on Coumadin. He was at a rehabilitation center. He's leaving that Center today.  He feels okay. He is getting around. He uses a walker.  He's had no cough or shortness of breath. His left leg is still a little swollen. I will give him a compression stocking for this.  Medications: Current outpatient prescriptions:acetaminophen (TYLENOL) 500 MG tablet, Take 1,000 mg by mouth every 6 (six) hours as needed for mild pain or moderate pain., Disp: , Rfl: ;  aspirin 325 MG tablet, Take 325 mg by mouth daily., Disp: , Rfl: ;  doxazosin (CARDURA) 8 MG tablet, Take 8 mg by mouth at bedtime. , Disp: , Rfl:  Naphazoline-Glycerin-Zinc Sulf (CLEAR EYES MAXIMUM ITCHY EYE OP), Place 2 drops into the left eye as needed (for reddness in eye)., Disp: , Rfl: ;  simvastatin (ZOCOR) 80 MG tablet, Take 80 mg by mouth every evening., Disp: , Rfl: ;  tolterodine (DETROL LA) 4 MG 24 hr capsule, Take 4 mg by mouth 2 (two) times daily. , Disp: , Rfl: ;  warfarin (COUMADIN) 5 MG tablet, Take 1 pill a day at 6PM, Disp: 30 tablet, Rfl: 3  Allergies:  Allergies  Allergen Reactions  . Penicillins Other (See Comments)    HIVES     Past Medical History, Surgical history, Social history, and Family History were reviewed and updated.  Review of Systems: As above  Physical Exam:  height is 6' (1.829 m) and weight is 259 lb (117.482 kg). His oral temperature is 98 F (36.7 C).  His blood pressure is 153/87 and his pulse is 101. His respiration is 20.   Elderly white gentleman in no obvious distress. Head and neck exam shows no ocular or oral lesions. There is no adenopathy in the neck. Lungs are clear and cardiac exam regular in rhythm. He has an occasional extra beat. He has a 1/6 systolic ejection murmur. Abdomen soft. Has good bowel sounds. There is no palpable liver or spleen tip. Back exam no tenderness extremities shows some mild nonpitting edema of the left leg. He has no palpable venous cord. She has good range of motion of his joints. Skin exam shows no ecchymosis. Neurological exam is non-focal.  Lab Results  Component Value Date   WBC 6.2 05/14/2013   HGB 11.6* 05/14/2013   HCT 36.5* 05/14/2013   MCV 85.1 05/14/2013   PLT 263 05/14/2013     Chemistry      Component Value Date/Time   NA 138 05/14/2013 0845   K 4.0 05/14/2013 0845   CL 102 05/14/2013 0845   CO2 26 05/14/2013 0845   BUN 16 05/14/2013 0845   CREATININE 1.19 05/14/2013 0845      Component Value Date/Time   CALCIUM 9.5 05/14/2013 0845   ALKPHOS 63 05/14/2013 0845   AST 31 05/14/2013 0845   ALT 25 05/14/2013 0845  BILITOT 0.4 05/14/2013 0845         Impression and Plan: [name] is an 80 year old gentleman. He has a DVT in his left leg. Clinically this is improving. This happened postop. Hyper coagulable l studies were not done as the chance of him having a thrombophilic state is very very low.  I will repeat a Doppler moist him back in about a month or so.  We will have to monitor his Coumadin.  I probably want his Coumadin to be checked weekly for right now.  I think that hopefully he will only need 3 months of anticoagulation.   Elijah Napoleon, MD 3/9/20153:25 PM

## 2013-05-23 ENCOUNTER — Other Ambulatory Visit: Payer: Self-pay | Admitting: *Deleted

## 2013-05-23 ENCOUNTER — Encounter: Payer: Self-pay | Admitting: Pharmacist

## 2013-05-23 DIAGNOSIS — I82409 Acute embolism and thrombosis of unspecified deep veins of unspecified lower extremity: Secondary | ICD-10-CM

## 2013-05-23 NOTE — Progress Notes (Signed)
New consult for [facility] Coumadin clinic referred by Dr. Marin Olp Dx: LLE DVT; dx 05/03/13.  S/P percutaneous nephrolithotomy 05/03/13 Started on Coumadin then had hematuria.  Anticoagulation interrupted & IVC filter placed.  Coumadin resumed by Dr. Marin Olp (consulted in hospital). Duration of anticoagulation: 6 months Goal INR = 2-3 Last INR on 05/14/13 = 2.87 on Coumadin 5 mg/day. Pt recently discharged home from [facility].  I s/w pts dtr today over the phone & she mentioned a home health nurse was coming to her father's house but her father declined their help. Pt will have labs drawn at [facility] & Coumadin clinic staff will need to correspond w/ pts dtr, [name] (ph# [phone]) or his dtr Fraser Din (she will leave her contact info w/ lab personnel in [place] on 05/25/13). [name] is aware her father's first visit for INR check in [place] will be 05/25/13 at 11:45 am. Kennith Center, Pharm.D., CPP 05/23/2013@3 :07 PM

## 2013-05-24 ENCOUNTER — Encounter: Payer: Self-pay | Admitting: Adult Health

## 2013-05-24 NOTE — Progress Notes (Signed)
Patient ID: [name], male   DOB: 02-23-1930, 80 y.o.   MRN: [id number]     [facility]  Allergies  Allergen Reactions  . Penicillins Other (See Comments)    HIVES      Chief Complaint  Patient presents with  . Discharge Note    HPI:  He is being discharged to home with home health for pt/ot/nursing and coumadin management. He will not need dme. He will need prescriptions to be written. He had been hospitalized for renal stones and dvt.   Past Medical History  Diagnosis Date  . Hypertension   . BPH (benign prostatic hyperplasia)   . Hyperlipidemia   . Cancer 2008-2009    prostate TREATED WITH RADIATION  . AAA (abdominal aortic aneurysm) 04/2012    STENTING OF AAA IN [place]  . Junctional cardiac arrhythmia     Occurred postoperatively after urologic surgery  . Severe sinus bradycardia     Occurred postoperatively after urologic surgery  . Aortic valve disorders   . Other primary cardiomyopathies     Past Surgical History  Procedure Laterality Date  . Abdominal surgery      PART OF COLON REMOVED FOR DIVERTICULITIS  . Appendectomy    . Hernia repair    . Bladder surgery  2008    FOR BLADDER STONE  . Total knee arthroplasty  1990    left  . Abdominal aortic aneurysm repair  04/2012  . Total knee arthroplasty Right 07/26/2012    Procedure: RIGHT TOTAL KNEE ARTHROPLASTY;  Surgeon: Tobi Bastos, MD;  Location: [facility];  Service: Orthopedics;  Laterality: Right;  . Nephrolithotomy Left 04/26/2013    Procedure: LEFT PERCUTANEOUS NEPHROLITHOTOMY ;  Surgeon: [name], MD;  Location: [facility];  Service: Urology;  Laterality: Left;  . Nephrolithotomy Left 05/03/2013    Procedure: 2ND STAGE LEFT PERCUTANEOUS NEPHROLITHOTOMY ;  Surgeon: [name], MD;  Location: [facility];  Service: Urology;  Laterality: Left;    VITAL SIGNS BP 119/80  Pulse 79  Ht 6' (1.829 m)  Wt 260 lb (117.935 kg)  BMI 35.25 kg/m2   Patient's Medications  New Prescriptions   No  medications on file  Previous Medications   ACETAMINOPHEN (TYLENOL) 500 MG TABLET    Take 1,000 mg by mouth every 6 (six) hours as needed for mild pain or moderate pain.   ASPIRIN 325 MG TABLET    Take 325 mg by mouth daily.   DOXAZOSIN (CARDURA) 8 MG TABLET    Take 8 mg by mouth at bedtime.    NAPHAZOLINE-GLYCERIN-ZINC SULF (CLEAR EYES MAXIMUM ITCHY EYE OP)    Place 2 drops into the left eye as needed (for reddness in eye).   SIMVASTATIN (ZOCOR) 80 MG TABLET    Take 80 mg by mouth every evening.   TOLTERODINE (DETROL LA) 4 MG 24 HR CAPSULE    Take 4 mg by mouth 2 (two) times daily.    WARFARIN (COUMADIN) 5 MG TABLET    Take 1 pill a day at 6PM  Modified Medications   No medications on file  Discontinued Medications   No medications on file    SIGNIFICANT DIAGNOSTIC EXAMS    03-23-13:2-d echo:  - Left ventricle: The cavity size was normal. Wall thickness was increased increased in a pattern of mild to moderate LVH. The estimated ejection fraction was 40%, mildly reduced.- Aortic valve: Severely calcified annulus. Severely thickened leaflets. There was mild to moderate stenosis. Morphologically the valve appears  at least moderately stenotic, lower gradients may be related to decreased ejection fraction.- Left atrium: The atrium was severely dilated.  04-27-13: ct of abdomen: Multiple residual left renal calculi as described above. Other findings involving the left kidney consistent with post-procedure appearance  Bilateral lower lobe consolidation.  05-03-13  KUB: Percutaneous nephrostomy with removal of left renal stone performed by Dr. Jeffie Pollock.   05-03-13: bilateral lower extremity doppler: No evidence of deep vein thrombosis involving the right lower extremity. - Findings consistent with extensive, occlusive, acute deep vein thrombosis involving the left common femoral vein, left profunda femoris vein, left femoral vein, left popliteal vein, left posterial tibial vein, and left peroneal  vein. - . Incidental findings are consistent with: Baker's Cyst on the left. - No evidence of Baker's cyst on the right.   05-05-13: bilateral lower extremity doppler: Findings consistent with acute deep vein thrombosis involving the distal popliteal , femoral, profunda, saphenofemoral junction, and common femoral veins of the left lower extremity. The thrombus noted in the common femoral has propagated further proximally since found in the study of 05/03/2013. - There has been propagation of a superficial thrombus now noted in the mid to proximal greater saphenous vein. - There is no evidence of propagation to the right lower extremity at this time. - No evidence of Baker's cyst on the left.     LABS REVIEWED:   04-25-13: wbc 5.9; hgb 13.7; hct 42.9; mcv 85.5 plt 126; gluocse 89; bun 20; creat 1.21; k+4.8; na++140  04-28-13: wbc 12.9; hgb 13.1; hct 39.1; mcv 85.4;plt 101 05-06-13: glucose 89; bun 26; creat 1.35; k+4.6; na++ 139 05-09-13: wbc 8.3; hgb 10.9; hct 33.8; mcv84.9; plt 261; glucose 96; bun 24; creat 1.32; k+4.4; na++137 05-14-13: wbc 6.2; hgb 11.6; hct 35.6; mcv 85.1;plt 263; glucose 99; bun 16; creat 1.19; k+4.0; na++ 138 liver normal albumin 2.5; mag 2.0; phos 3.6   Review of Systems  Constitutional: Negative for fever, malaise/fatigue and diaphoresis.  Eyes: Negative for blurred vision.  Respiratory: Negative for cough, shortness of breath and wheezing.   Cardiovascular: Negative for chest pain, palpitations and leg swelling.  Gastrointestinal: Negative for heartburn, abdominal pain, diarrhea and constipation.  Genitourinary: Negative for dysuria, hematuria and flank pain.  Musculoskeletal: Negative for back pain, joint pain and myalgias.  Skin: Negative.   Neurological: Negative for dizziness, weakness and headaches.  Psychiatric/Behavioral: Negative for depression. The patient is not nervous/anxious.      Physical Exam  Constitutional: He is oriented to person, place, and  time. He appears well-developed and well-nourished. No distress.  Overweight   Neck: Neck supple. No JVD present.  Cardiovascular: Normal rate, regular rhythm and intact distal pulses.   Respiratory: Effort normal and breath sounds normal. No respiratory distress. He has no wheezes.  GI: Soft. Bowel sounds are normal. He exhibits no distension. There is no tenderness.  Musculoskeletal: Normal range of motion. He exhibits no edema.  Neurological: He is alert and oriented to person, place, and time.  Skin: Skin is warm and dry. He is not diaphoretic.  Psychiatric: He has a normal mood and affect.      ASSESSMENT/ PLAN:  Will discharge him to home with home health for pt/ot/nursing coumadin management. He will not need dme. His prescriptions have been written.   Time spent with patient 35 minutes.      Ok Edwards NP [facility]  Contact [phone] Monday through Friday 8am- 5pm  After hours call [phone]

## 2013-05-25 ENCOUNTER — Other Ambulatory Visit (HOSPITAL_BASED_OUTPATIENT_CLINIC_OR_DEPARTMENT_OTHER): Payer: Medicare Other | Admitting: Lab

## 2013-05-25 ENCOUNTER — Ambulatory Visit (INDEPENDENT_AMBULATORY_CARE_PROVIDER_SITE_OTHER): Payer: Medicare Other | Admitting: Pharmacist

## 2013-05-25 ENCOUNTER — Ambulatory Visit (INDEPENDENT_AMBULATORY_CARE_PROVIDER_SITE_OTHER): Payer: Self-pay | Admitting: Urology

## 2013-05-25 DIAGNOSIS — I82409 Acute embolism and thrombosis of unspecified deep veins of unspecified lower extremity: Secondary | ICD-10-CM

## 2013-05-25 DIAGNOSIS — N200 Calculus of kidney: Secondary | ICD-10-CM

## 2013-05-25 DIAGNOSIS — R82998 Other abnormal findings in urine: Secondary | ICD-10-CM

## 2013-05-25 DIAGNOSIS — Z7901 Long term (current) use of anticoagulants: Secondary | ICD-10-CM

## 2013-05-25 LAB — POCT INR: INR: 3.7

## 2013-05-25 LAB — CBC WITH DIFFERENTIAL (CANCER CENTER ONLY)
BASO#: 0 10*3/uL (ref 0.0–0.2)
BASO%: 0.3 % (ref 0.0–2.0)
EOS ABS: 0.3 10*3/uL (ref 0.0–0.5)
EOS%: 4.4 % (ref 0.0–7.0)
HCT: 39.8 % (ref 38.7–49.9)
HEMOGLOBIN: 12.6 g/dL — AB (ref 13.0–17.1)
LYMPH#: 1.4 10*3/uL (ref 0.9–3.3)
LYMPH%: 23.1 % (ref 14.0–48.0)
MCH: 27.3 pg — AB (ref 28.0–33.4)
MCHC: 31.7 g/dL — ABNORMAL LOW (ref 32.0–35.9)
MCV: 86 fL (ref 82–98)
MONO#: 0.7 10*3/uL (ref 0.1–0.9)
MONO%: 12 % (ref 0.0–13.0)
NEUT#: 3.6 10*3/uL (ref 1.5–6.5)
NEUT%: 60.2 % (ref 40.0–80.0)
Platelets: 183 10*3/uL (ref 145–400)
RBC: 4.62 10*6/uL (ref 4.20–5.70)
RDW: 15.4 % (ref 11.1–15.7)
WBC: 5.9 10*3/uL (ref 4.0–10.0)

## 2013-05-25 LAB — PROTIME-INR (CHCC SATELLITE)
INR: 3.7 — ABNORMAL HIGH (ref 2.0–3.5)
Protime: 44.4 Seconds — ABNORMAL HIGH (ref 10.6–13.4)

## 2013-05-25 NOTE — Progress Notes (Signed)
**  Telephone encounter-do not charge**  I spoke with pt daughter, [name], on phone today.  We discussed overview of coumadin education including indication, s/s bleeding, drug interaction, herbal/otc medications, Vit K containing foods and EtOH use.  [name] has been taking coumadin 5mg  daily.  INR = 3.7 today.  Instructed to hold coumadin today and decrease to 5mg  daily and 2.5mg  on Monday.  Will check PT/INR on Tuesday 05/29/13 b/c he has other MD appts in [place] same day.  Will call daughter [name] with these results and further dosing instructions.

## 2013-05-29 ENCOUNTER — Ambulatory Visit (INDEPENDENT_AMBULATORY_CARE_PROVIDER_SITE_OTHER): Payer: Medicare Other | Admitting: Pharmacist

## 2013-05-29 ENCOUNTER — Other Ambulatory Visit (HOSPITAL_BASED_OUTPATIENT_CLINIC_OR_DEPARTMENT_OTHER): Payer: Medicare Other | Admitting: Lab

## 2013-05-29 DIAGNOSIS — I82409 Acute embolism and thrombosis of unspecified deep veins of unspecified lower extremity: Secondary | ICD-10-CM

## 2013-05-29 DIAGNOSIS — Z7901 Long term (current) use of anticoagulants: Secondary | ICD-10-CM

## 2013-05-29 LAB — POCT INR: INR: 2.4

## 2013-05-29 LAB — PROTIME-INR (CHCC SATELLITE)
INR: 2.4 (ref 2.0–3.5)
PROTIME: 28.8 s — AB (ref 10.6–13.4)

## 2013-05-29 NOTE — Progress Notes (Signed)
INR = 2.4 Pt held 1 dose on Fri 3/13 then started 5 mg daily except 2.5 mg on Mon. I s/w pts dtr [name] over the phone today (NO CHARGE encounter).  [name] reports no problems re: anticoag. At some point end of May/beginning of June pt will be having aortic stent placed for AAA. INR at goal.  Pt will continue 5 mg daily except 2.5 mg on Mondays. Return to [place] for lab only 06/08/13 (10 days) & we'll call [name] w/ results & dosing instructions. Kennith Center, Pharm.D., CPP 05/29/2013@11 :57 AM

## 2013-06-07 ENCOUNTER — Telehealth: Payer: Self-pay | Admitting: *Deleted

## 2013-06-07 NOTE — Telephone Encounter (Signed)
[name] called stating that her father, [name], is having aneurysm surgery  With Dr. Sammuel Hines at [facility].  Patient is on Coumadin 5 mg daily and she wanted to know procedure for Coumadin and surgery. Spoke with Dr. Marin Olp.  Patient will need to be on Lovenox SQ bridge after stopping Coumadin around time of surgery.  Franco Collet that when they get an exact date for surgery to make sure she lets [place] know to arrange for Lovenox injections.

## 2013-06-08 ENCOUNTER — Ambulatory Visit: Payer: Medicare Other | Admitting: Pharmacist

## 2013-06-08 ENCOUNTER — Other Ambulatory Visit (HOSPITAL_BASED_OUTPATIENT_CLINIC_OR_DEPARTMENT_OTHER): Payer: Medicare Other | Admitting: Lab

## 2013-06-08 DIAGNOSIS — I82409 Acute embolism and thrombosis of unspecified deep veins of unspecified lower extremity: Secondary | ICD-10-CM

## 2013-06-08 LAB — PROTIME-INR (CHCC SATELLITE)
INR: 2.3 (ref 2.0–3.5)
PROTIME: 27.6 s — AB (ref 10.6–13.4)

## 2013-06-08 LAB — POCT INR: INR: 2.3

## 2013-06-08 NOTE — Progress Notes (Signed)
INR = 2.3 on Coumadin 5 mg daily except 2.5 mg on Mondays Pt has no complaints re: anticoag. No recent med changes. No bleeding/bruising. INR at goal.  No change to dose necessary. Pt returns to [place] 06/29/13 for lab/MD visit so we can take care of his INR that day either in [place] or by phone. NO CHARGE. Kennith Center, Pharm.D., CPP 06/08/2013@9 :18 AM

## 2013-06-11 ENCOUNTER — Other Ambulatory Visit: Payer: Self-pay | Admitting: Pharmacist

## 2013-06-29 ENCOUNTER — Encounter: Payer: Self-pay | Admitting: Lab

## 2013-06-29 ENCOUNTER — Ambulatory Visit (HOSPITAL_BASED_OUTPATIENT_CLINIC_OR_DEPARTMENT_OTHER): Payer: Medicare Other | Admitting: Hematology & Oncology

## 2013-06-29 ENCOUNTER — Ambulatory Visit (HOSPITAL_BASED_OUTPATIENT_CLINIC_OR_DEPARTMENT_OTHER)
Admission: RE | Admit: 2013-06-29 | Discharge: 2013-06-29 | Disposition: A | Discharge status: Home / Self Care | Payer: Medicare Other | Source: Ambulatory Visit | Attending: Hematology & Oncology | Admitting: Hematology & Oncology

## 2013-06-29 ENCOUNTER — Inpatient Hospital Stay (HOSPITAL_COMMUNITY)
Admission: AD | Admit: 2013-06-29 | Discharge: 2013-07-05 | DRG: 300 | Disposition: A | Discharge status: Home / Self Care | Payer: Medicare Other | Source: Ambulatory Visit | Attending: Hematology & Oncology | Admitting: Hematology & Oncology

## 2013-06-29 ENCOUNTER — Telehealth: Payer: Self-pay

## 2013-06-29 ENCOUNTER — Inpatient Hospital Stay (HOSPITAL_COMMUNITY): Payer: Medicare Other

## 2013-06-29 ENCOUNTER — Ambulatory Visit (HOSPITAL_BASED_OUTPATIENT_CLINIC_OR_DEPARTMENT_OTHER): Payer: Medicare Other | Admitting: Lab

## 2013-06-29 ENCOUNTER — Encounter (HOSPITAL_COMMUNITY): Payer: Self-pay | Admitting: *Deleted

## 2013-06-29 ENCOUNTER — Other Ambulatory Visit: Payer: Self-pay

## 2013-06-29 DIAGNOSIS — Z87891 Personal history of nicotine dependence: Secondary | ICD-10-CM

## 2013-06-29 DIAGNOSIS — I359 Nonrheumatic aortic valve disorder, unspecified: Secondary | ICD-10-CM | POA: Diagnosis present

## 2013-06-29 DIAGNOSIS — I82409 Acute embolism and thrombosis of unspecified deep veins of unspecified lower extremity: Secondary | ICD-10-CM

## 2013-06-29 DIAGNOSIS — Z880 Allergy status to penicillin: Secondary | ICD-10-CM

## 2013-06-29 DIAGNOSIS — H9190 Unspecified hearing loss, unspecified ear: Secondary | ICD-10-CM | POA: Diagnosis present

## 2013-06-29 DIAGNOSIS — I10 Essential (primary) hypertension: Secondary | ICD-10-CM

## 2013-06-29 DIAGNOSIS — I82509 Chronic embolism and thrombosis of unspecified deep veins of unspecified lower extremity: Principal | ICD-10-CM | POA: Diagnosis present

## 2013-06-29 DIAGNOSIS — Z96659 Presence of unspecified artificial knee joint: Secondary | ICD-10-CM

## 2013-06-29 DIAGNOSIS — N400 Enlarged prostate without lower urinary tract symptoms: Secondary | ICD-10-CM | POA: Diagnosis present

## 2013-06-29 DIAGNOSIS — I428 Other cardiomyopathies: Secondary | ICD-10-CM | POA: Diagnosis present

## 2013-06-29 DIAGNOSIS — M7989 Other specified soft tissue disorders: Secondary | ICD-10-CM

## 2013-06-29 DIAGNOSIS — Z6834 Body mass index (BMI) 34.0-34.9, adult: Secondary | ICD-10-CM

## 2013-06-29 DIAGNOSIS — I495 Sick sinus syndrome: Secondary | ICD-10-CM | POA: Diagnosis present

## 2013-06-29 DIAGNOSIS — Z79899 Other long term (current) drug therapy: Secondary | ICD-10-CM

## 2013-06-29 DIAGNOSIS — E785 Hyperlipidemia, unspecified: Secondary | ICD-10-CM | POA: Diagnosis present

## 2013-06-29 DIAGNOSIS — E669 Obesity, unspecified: Secondary | ICD-10-CM | POA: Diagnosis present

## 2013-06-29 DIAGNOSIS — I498 Other specified cardiac arrhythmias: Secondary | ICD-10-CM

## 2013-06-29 DIAGNOSIS — Z801 Family history of malignant neoplasm of trachea, bronchus and lung: Secondary | ICD-10-CM

## 2013-06-29 DIAGNOSIS — Z7901 Long term (current) use of anticoagulants: Secondary | ICD-10-CM

## 2013-06-29 LAB — PROTIME-INR
INR: 2.16 — AB (ref 0.00–1.49)
Prothrombin Time: 23.4 seconds — ABNORMAL HIGH (ref 11.6–15.2)

## 2013-06-29 LAB — COMPREHENSIVE METABOLIC PANEL
ALBUMIN: 3.1 g/dL — AB (ref 3.5–5.2)
ALT: 12 U/L (ref 0–53)
AST: 17 U/L (ref 0–37)
Alkaline Phosphatase: 67 U/L (ref 39–117)
BILIRUBIN TOTAL: 0.3 mg/dL (ref 0.3–1.2)
BUN: 22 mg/dL (ref 6–23)
CALCIUM: 9.1 mg/dL (ref 8.4–10.5)
CO2: 24 mEq/L (ref 19–32)
CREATININE: 1.24 mg/dL (ref 0.50–1.35)
Chloride: 107 mEq/L (ref 96–112)
GFR calc Af Amer: 61 mL/min — ABNORMAL LOW (ref >90)
GFR calc non Af Amer: 53 mL/min — ABNORMAL LOW (ref >90)
Glucose, Bld: 123 mg/dL — ABNORMAL HIGH (ref 70–99)
Potassium: 4 mEq/L (ref 3.7–5.3)
Sodium: 143 mEq/L (ref 137–147)
Total Protein: 6.2 g/dL (ref 6.0–8.3)

## 2013-06-29 LAB — CBC WITH DIFFERENTIAL (CANCER CENTER ONLY)
BASO#: 0 10*3/uL (ref 0.0–0.2)
BASO%: 0.4 % (ref 0.0–2.0)
EOS%: 3.9 % (ref 0.0–7.0)
Eosinophils Absolute: 0.2 10*3/uL (ref 0.0–0.5)
HCT: 39.4 % (ref 38.7–49.9)
HGB: 12.7 g/dL — ABNORMAL LOW (ref 13.0–17.1)
LYMPH#: 1.3 10*3/uL (ref 0.9–3.3)
LYMPH%: 24.2 % (ref 14.0–48.0)
MCH: 28 pg (ref 28.0–33.4)
MCHC: 32.2 g/dL (ref 32.0–35.9)
MCV: 87 fL (ref 82–98)
MONO#: 0.6 10*3/uL (ref 0.1–0.9)
MONO%: 12.4 % (ref 0.0–13.0)
NEUT#: 3.1 10*3/uL (ref 1.5–6.5)
NEUT%: 59.1 % (ref 40.0–80.0)
PLATELETS: 156 10*3/uL (ref 145–400)
RBC: 4.53 10*6/uL (ref 4.20–5.70)
RDW: 16.6 % — ABNORMAL HIGH (ref 11.1–15.7)
WBC: 5.2 10*3/uL (ref 4.0–10.0)

## 2013-06-29 LAB — PROTIME-INR (CHCC SATELLITE)
INR: 2.5 (ref 2.0–3.5)
Protime: 30 Seconds — ABNORMAL HIGH (ref 10.6–13.4)

## 2013-06-29 LAB — D-DIMER, QUANTITATIVE (NOT AT ARMC): D DIMER QUANT: 2.5 ug{FEU}/mL — AB (ref 0.00–0.48)

## 2013-06-29 LAB — APTT: aPTT: 36 seconds (ref 24–37)

## 2013-06-29 MED ORDER — SODIUM CHLORIDE 0.9 % IV SOLN
INTRAVENOUS | Status: DC
  Administered 2013-06-29: 10 mL/h via INTRAVENOUS

## 2013-06-29 MED ORDER — HEPARIN (PORCINE) IN NACL 100-0.45 UNIT/ML-% IJ SOLN
1700.0000 [IU]/h | INTRAMUSCULAR | Status: DC
  Administered 2013-06-29 – 2013-06-30 (×2): 1700 [IU]/h via INTRAVENOUS
  Filled 2013-06-29 (×4): qty 250

## 2013-06-29 MED ORDER — HEPARIN BOLUS VIA INFUSION
3000.0000 [IU] | Freq: Once | INTRAVENOUS | Status: AC
  Administered 2013-06-29: 3000 [IU] via INTRAVENOUS
  Filled 2013-06-29: qty 3000

## 2013-06-29 NOTE — Progress Notes (Signed)
Mr.. Gassett comes in for his followup. He has a much more swollen left leg. His Coumadin has been therapeutic. His INR is 2.5.  He had a Doppler today. This showed extensive thrombus in his left leg.  I am worried that he is resistant to Coumadin. He is going to be admitted. He Is going need to be on heparin. He may need thrombolytic therapy. He had bladder surgery about 3 weeks ago. Am not sure what they call office for probably therapy for the radiologist.  He has a filter in his IVC. There is no cough or shortness of breath.  He is to go to [facility] [place] for a aneurysm stent procedure in about 3 weeks.  We will have to admit him.  We will have the pharmacy manage his heparin.  We will see what response we get with heparin. Again we may have to consider thrombolytic therapy. This might be a little bit difficult given his age and his bladder surgery that was about 3 weeks ago.  I will plan for an followup after we get him out of the hospital.

## 2013-06-29 NOTE — Progress Notes (Signed)
ANTICOAGULATION CONSULT NOTE - Initial Consult  Pharmacy Consult for IV heparin Indication: resistant DVT, warfarin failure, s/p IVC filter  Allergies  Allergen Reactions  . Penicillins Other (See Comments)    HIVES     Patient Measurements: Height: 6\' 2"  (188 cm) Weight: 277 lb (125.646 kg) IBW/kg (Calculated) : 82.2 Heparin Dosing Weight: 110 kg  Vital Signs: Temp: 98.3 F (36.8 C) (04/17 1700) Temp src: Oral (04/17 1700) BP: 139/89 mmHg (04/17 1700) Pulse Rate: 84 (04/17 1700)  Labs:  Recent Labs  06/29/13 1000  HGB 12.7*  HCT 39.4  PLT 156  INR 2.5    Estimated Creatinine Clearance: 68.6 ml/min (by C-G formula based on Cr of 1.19).   Medical History: Past Medical History  Diagnosis Date  . Hypertension   . BPH (benign prostatic hyperplasia)   . Hyperlipidemia   . Cancer 2008-2009    prostate TREATED WITH RADIATION  . AAA (abdominal aortic aneurysm) 04/2012    STENTING OF AAA IN [place]  . Junctional cardiac arrhythmia     Occurred postoperatively after urologic surgery  . Severe sinus bradycardia     Occurred postoperatively after urologic surgery  . Aortic valve disorders   . Other primary cardiomyopathies     Medications:  Scheduled:   Infusions:  . sodium chloride      Assessment: 80 yo male direct admit from office visit to start IV heparin for a resistant DVT in left leg that happened post op renal stone removal and therefore failed warfarin outpatient per Md orders. Patient also s/p IVC filter. INR therapeutic this AM at 2.5 on home dose of warfarin of 5mg  daily except 2.5mg  on Mondays. No reported bleeding. Stable CBC  Goal of Therapy:  Heparin level 0.3-0.7 units/ml Monitor platelets by anticoagulation protocol: Yes   Plan:  1) Will give 3000 unit IV heparin bolus then 2) 1700 units/hr IV heparin infusion rate 3) Check 8 hour heparin level 4) Daily CBC and heparin level 5) Monitor for bleeding closely since INR is still  therapeutic with start of IV heparin   Adrian Saran, PharmD, BCPS Pager [phone] 06/29/2013 5:41 PM

## 2013-06-29 NOTE — Telephone Encounter (Signed)
Kim in patient placement notified of need for 3rd floor bed at [facility] for resistant DVT. Baxter Flattery, financial counselor aware. dph

## 2013-06-29 NOTE — H&P (Signed)
[name] is an 80 y.o. male.    Chief Complaint: Resistant blood clot of the left leg   HPI: 80 year old gentleman with a recent thrombus of the left leg. This occurred after a bladder procedure. He was on heparin and then Coumadin. He was in the office today in his left leg was quite swollen. A repeat Doppler showed a large thrombus throughout his left leg. He is being readmitted for heparin therapy and then possibly an thrombolytic therapy.  He has a filter in. He has no cough. No shortness of breath. He's had no bleeding. There is no headache. There is no change in bowel or bladder habits.   ROS: See above  Past Medical History  Diagnosis Date  . Hypertension   . BPH (benign prostatic hyperplasia)   . Hyperlipidemia   . Cancer 2008-2009    prostate TREATED WITH RADIATION  . AAA (abdominal aortic aneurysm) 04/2012    STENTING OF AAA IN [place]  . Junctional cardiac arrhythmia     Occurred postoperatively after urologic surgery  . Severe sinus bradycardia     Occurred postoperatively after urologic surgery  . Aortic valve disorders   . Other primary cardiomyopathies     Past Surgical History  Procedure Laterality Date  . Abdominal surgery      PART OF COLON REMOVED FOR DIVERTICULITIS  . Appendectomy    . Hernia repair    . Bladder surgery  2008    FOR BLADDER STONE  . Total knee arthroplasty  1990    left  . Abdominal aortic aneurysm repair  04/2012  . Total knee arthroplasty Right 07/26/2012    Procedure: RIGHT TOTAL KNEE ARTHROPLASTY;  Surgeon: Tobi Bastos, MD;  Location: [facility];  Service: Orthopedics;  Laterality: Right;  . Nephrolithotomy Left 04/26/2013    Procedure: LEFT PERCUTANEOUS NEPHROLITHOTOMY ;  Surgeon: [name], MD;  Location: [facility];  Service: Urology;  Laterality: Left;  . Nephrolithotomy Left 05/03/2013    Procedure: 2ND STAGE LEFT PERCUTANEOUS NEPHROLITHOTOMY ;  Surgeon: [name], MD;  Location: [facility];  Service: Urology;  Laterality:  Left;    Family History  Problem Relation Age of Onset  . Lung cancer Father     Social History:  reports that he quit smoking about 28 years ago. His smoking use included Cigarettes. He started smoking about 64 years ago. He has a 74 pack-year smoking history. He has never used smokeless tobacco. He reports that he does not drink alcohol or use illicit drugs.  Allergies:  Allergies  Allergen Reactions  . Penicillins Other (See Comments)    HIVES     Medications Prior to Admission  Medication Sig Dispense Refill  . acetaminophen (TYLENOL) 500 MG tablet Take 1,000 mg by mouth every 6 (six) hours as needed for mild pain or moderate pain.      Marland Kitchen aspirin 325 MG tablet Take 325 mg by mouth daily.      Marland Kitchen doxazosin (CARDURA) 8 MG tablet Take 8 mg by mouth at bedtime.       . Naphazoline-Glycerin-Zinc Sulf (CLEAR EYES MAXIMUM ITCHY EYE OP) Place 2 drops into the left eye as needed (for reddness in eye).      . simvastatin (ZOCOR) 80 MG tablet Take 80 mg by mouth every evening.      . tolterodine (DETROL LA) 4 MG 24 hr capsule Take 4 mg by mouth 2 (two) times daily.       Marland Kitchen warfarin (  COUMADIN) 5 MG tablet Take 1 pill a day at 6PM  30 tablet  3    Results for orders placed in visit on 06/29/13 (from the past 48 hour(s))  CBC WITH DIFFERENTIAL ([place])     Status: Abnormal   Collection Time    06/29/13 10:00 AM      Result Value Ref Range   WBC 5.2  4.0 - 10.0 10e3/uL   RBC 4.53  4.20 - 5.70 10e6/uL   HGB 12.7 (*) 13.0 - 17.1 g/dL   HCT 39.4  38.7 - 49.9 %   MCV 87  82 - 98 fL   MCH 28.0  28.0 - 33.4 pg   MCHC 32.2  32.0 - 35.9 g/dL   RDW 16.6 (*) 11.1 - 15.7 %   Platelets 156  145 - 400 10e3/uL   NEUT# 3.1  1.5 - 6.5 10e3/uL   LYMPH# 1.3  0.9 - 3.3 10e3/uL   MONO# 0.6  0.1 - 0.9 10e3/uL   Eosinophils Absolute 0.2  0.0 - 0.5 10e3/uL   BASO# 0.0  0.0 - 0.2 10e3/uL   NEUT% 59.1  40.0 - 80.0 %   LYMPH% 24.2  14.0 - 48.0 %   MONO% 12.4  0.0 - 13.0 %   EOS% 3.9  0.0 - 7.0 %    BASO% 0.4  0.0 - 2.0 %  D-DIMER, QUANTITATIVE     Status: Abnormal   Collection Time    06/29/13 10:00 AM      Result Value Ref Range   D-Dimer, Quant 2.50 (*) 0.00 - 0.48 ug/mL-FEU   Comment: At the inhouse established cutoff value of 0.48 ug/mL FEU, thismethology has been documented in the literature to have a sensitivityand negative predictive value of at least 98-99%.  The test resultshould be correlated with an assessment of the clinical      probability ofDVT/VTE.  PROTIME-INR ([facility])     Status: Abnormal   Collection Time    06/29/13 10:00 AM      Result Value Ref Range   Protime 30.0 (*) 10.6 - 13.4 Seconds   INR 2.5  2.0 - 3.5   Comment: INR is useful only to assess adequacy of anticoagulation with coumadin when comparing results from different labs. It should not be used to estimate bleeding risk or presence/abscense of coagulopathy in patients not on coumadin. Expected INR ranges for      nontherapeutic patients is 0.88 - 1.12.   Lovenox No      US Venous Img Lower Unilateral Left  06/29/2013   CLINICAL DATA:  Known left lower extremity DVT.  EXAM: LOWER EXTREMITY VENOUS DOPPLER ULTRASOUND  TECHNIQUE: Gray-scale sonography with graded compression, as well as color Doppler and duplex ultrasound were performed to evaluate the lower extremity deep venous systems from the level of the common femoral vein and including the common femoral, femoral, profunda femoral, popliteal and calf veins including the posterior tibial, peroneal and gastrocnemius veins when visible. The superficial great saphenous vein was also interrogated. Spectral Doppler was utilized to evaluate flow at rest and with distal augmentation maneuvers in the common femoral, femoral and popliteal veins.  COMPARISON:  None.  FINDINGS: Common Femoral Vein: There is thrombus with lack of compressibility and augmentation.  Saphenofemoral Junction: There is thrombus with lack of compressibility and augmentation.   Profunda Femoral Vein: There is thrombus with lack of compressibility and augmentation.  Femoral Vein: There is thrombus with lack of compressibility and augmentation.  Popliteal  Vein: There is thrombus with lack of compressibility and augmentation.  Calf Veins: Left posterior tibial and peroneal veins did not demonstrate color flow in the mid calf.  Other Findings:  None.  IMPRESSION: Deep venous thrombosis from left common femoral vein to popliteal vein with partial color flow.   Electronically Signed   By: Abelardo Diesel M.D.   On: 06/29/2013 10:13     BP 139/89  Pulse 84  Temp(Src) 98.3 F (36.8 C) (Oral)  Resp 22  Ht 6\' 2"  (1.88 m)  Wt 277 lb (125.646 kg)  BMI 35.55 kg/m2  SpO2 94%  Physical Examination: Elderly white gentleman. Lungs are clear. Cardiac exam regular rate and rhythm. Has urologic procedure about a week ago. 2/6 systolic murmur. Abdomen soft. There is no palpable liver or spleen tip. Extremities shows 3+ nonpitting edema the left leg. Right leg is unremarkable. No venous cord is obvious he noted in the legs. He has decent pulses. Neurological exam is nonfocal. Skin exam shows some scattered seborrheic and actinic keratosis.   Assessment/Plan Mr. Barlowe is an 80 year old gentleman. He underwent a urologic procedure about 4 weeks ago. After that he developed a thrombus in the left leg. He was on heparin the hospital. He was discharged on Coumadin. His Coumadin has been therapeutic. Unfortunately, it appears that he is resistant to Coumadin. He clearly has a large thrombus in the in left leg. We must get back on heparin.  He has to go to [facility] for an aneurysm procedure in about 3 weeks or so. We have to really get this blood clot in her control. He does have a filter in.  I am not sure if he is a candidate for thrombolytic therapy. Again this is a fairly large thrombus. I may have to talk with radiology regarding this.  We will have pharmacy manage the heparin.  We  will have him watch his other issues.  He is a full code.  [name], MD 06/29/2013, 5:33 PM

## 2013-06-30 LAB — CBC
HEMATOCRIT: 37.3 % — AB (ref 39.0–52.0)
Hemoglobin: 12.1 g/dL — ABNORMAL LOW (ref 13.0–17.0)
MCH: 27.5 pg (ref 26.0–34.0)
MCHC: 32.4 g/dL (ref 30.0–36.0)
MCV: 84.8 fL (ref 78.0–100.0)
PLATELETS: 144 10*3/uL — AB (ref 150–400)
RBC: 4.4 MIL/uL (ref 4.22–5.81)
RDW: 16.4 % — AB (ref 11.5–15.5)
WBC: 4.8 10*3/uL (ref 4.0–10.5)

## 2013-06-30 LAB — HEPARIN LEVEL (UNFRACTIONATED)
HEPARIN UNFRACTIONATED: 0.74 [IU]/mL — AB (ref 0.30–0.70)
Heparin Unfractionated: 0.57 IU/mL (ref 0.30–0.70)
Heparin Unfractionated: 0.62 IU/mL (ref 0.30–0.70)

## 2013-06-30 LAB — APTT: aPTT: 159 seconds — ABNORMAL HIGH (ref 24–37)

## 2013-06-30 LAB — PROTIME-INR
INR: 2.17 — AB (ref 0.00–1.49)
PROTHROMBIN TIME: 23.5 s — AB (ref 11.6–15.2)

## 2013-06-30 MED ORDER — HEPARIN (PORCINE) IN NACL 100-0.45 UNIT/ML-% IJ SOLN
1600.0000 [IU]/h | INTRAMUSCULAR | Status: DC
  Administered 2013-07-01 – 2013-07-03 (×4): 1600 [IU]/h via INTRAVENOUS
  Filled 2013-06-30 (×8): qty 250

## 2013-06-30 NOTE — Progress Notes (Signed)
ANTICOAGULATION CONSULT NOTE - Follow Up  Pharmacy Consult for IV heparin Indication: resistant DVT, warfarin failure, s/p IVC filter  Assessment: 83 yoM on IV heparin for resistant DVT in left leg.  Currently heparin infusing at 1700 units/hr  Heparin level 0.74, above goal range  No bleeding or complications reported   Goal of Therapy:  Heparin level 0.3-0.7 units/ml Monitor platelets by anticoagulation protocol: Yes   Plan:   Decrease to Heparin IV infusion at 1600 units/hr  Recheck Heparin level tonight at 5 am    Gretta Arab PharmD, BCPS Pager [phone] 06/30/2013 8:41 PM

## 2013-06-30 NOTE — Progress Notes (Signed)
ANTICOAGULATION CONSULT NOTE - Follow Up Consult  Pharmacy Consult for Heparin Indication: resistant DVT, warfarin failure, s/p IVC filter  Allergies  Allergen Reactions  . Penicillins Other (See Comments)    HIVES     Patient Measurements: Height: 6\' 2"  (188 cm) Weight: 277 lb (125.646 kg) IBW/kg (Calculated) : 82.2 Heparin Dosing Weight:   Vital Signs: Temp: 98.3 F (36.8 C) (04/17 2052) Temp src: Oral (04/17 2052) BP: 125/73 mmHg (04/17 2052) Pulse Rate: 78 (04/17 2052)  Labs:  Recent Labs  06/29/13 1000 06/29/13 1715 06/30/13 0300 06/30/13 0342  HGB 12.7*  --   --  12.1*  HCT 39.4  --   --  37.3*  PLT 156  --   --  144*  APTT  --  36  --   --   LABPROT  --  23.4* 23.5*  --   INR 2.5 2.16* 2.17*  --   HEPARINUNFRC  --   --  0.57  --   CREATININE  --  1.24  --   --     Estimated Creatinine Clearance: 65.8 ml/min (by C-G formula based on Cr of 1.24).   Medications:  Infusions:  . sodium chloride 10 mL/hr (06/29/13 1715)  . heparin 1,700 Units/hr (06/29/13 1834)    Assessment: Heparin at goal.  No issues per RN.  Goal of Therapy:  Heparin level 0.3-0.7 units/ml Monitor platelets by anticoagulation protocol: Yes   Plan:  Continue heparin at current rate. Recheck level at [place] 06/30/2013,5:34 AM

## 2013-06-30 NOTE — Progress Notes (Signed)
Mr. Macphail is doing fairly well. He is not complaining of any shortness of breath or chest pain. His left leg might be a little less swollen. He is on a heparin drip. I want to keep him on the heparin drip over the weekend.  We still may have to consider thrombolytic therapy depending on what kind of result we get with the heparin.  His chest x-ray when he came in showed a developing infiltrate in the left lung. He's asymptomatic. He's afebrile. I want to make sure that he does incentive spirometry.  He's not having any abdominal pain. There's no nausea or vomiting.  His vital signs are stable. Blood pressure 150/94. Temperature 97.9. Pulse is 77. Head and neck exam shows no ocular or oral lesions. He has no palpable cervical or supraclavicular lymph nodes. Lungs are clear bilaterally. Cardiac exam regular rate and rhythm. He has a 1/6 systolic murmur. Abdomen is soft. Active bowel sounds. There is no palpable liver or spleen tip. Extremities still shows some pitting edema of the left leg. It does not look as swollen. No obvious venous cord is noted. Right leg is unremarkable.  We will continue the heparin over the weekend. On Monday, I may want to repeat a Doppler to see how things look. I still may consider thrombolytic therapy. I realize his age is a factor. He does have some other health conditions. He does have a aortic aneurysm that might also be a factor.  Ultimately, I thought would want to get him on subcutaneous anticoagulation with him Lovenox or Arixtra.  I appreciate the great care that he is getting from the staff on 3 W.!!!!  Pete E  [place] 5:15

## 2013-06-30 NOTE — Progress Notes (Signed)
ANTICOAGULATION CONSULT NOTE - Follow Up  Pharmacy Consult for IV heparin Indication: resistant DVT, warfarin failure, s/p IVC filter  Allergies  Allergen Reactions  . Penicillins Other (See Comments)    HIVES    Patient Measurements: Height: 6\' 2"  (188 cm) Weight: 274 lb 11.1 oz (124.6 kg) IBW/kg (Calculated) : 82.2 Heparin Dosing Weight: 110 kg  Vital Signs: Temp: 97.9 F (36.6 C) (04/18 0604) Temp src: Oral (04/18 0604) BP: 150/94 mmHg (04/18 0604) Pulse Rate: 77 (04/18 0604)  Labs:  Recent Labs  06/29/13 1000 06/29/13 1715 06/30/13 0300 06/30/13 0342 06/30/13 1012  HGB 12.7*  --   --  12.1*  --   HCT 39.4  --   --  37.3*  --   PLT 156  --   --  144*  --   APTT  --  36 159*  --   --   LABPROT  --  23.4* 23.5*  --   --   INR 2.5 2.16* 2.17*  --   --   HEPARINUNFRC  --   --  0.57  --  0.62  CREATININE  --  1.24  --   --   --    Estimated Creatinine Clearance: 65.6 ml/min (by C-G formula based on Cr of 1.24).  Scheduled:   Infusions:  . sodium chloride 10 mL/hr (06/29/13 1715)  . heparin 1,700 Units/hr (06/30/13 0719)   Assessment: 80 yo male direct admit from office visit to start IV heparin for a resistant DVT in left leg that happened post op renal stone removal and therefore failed warfarin outpatient per Md orders. Patient also s/p IVC filter. INR therapeutic this AM at 2.5 on home dose of warfarin of 5mg  daily except 2.5mg  on Mondays. No reported bleeding. Stable CBC  Began with 3000 unit IV heparin bolus and infusion at 1700 units/hr  First Heparin level 0.57, rpt level 0.62 - both within desired range  INR 2.17 this am, still in therapeutic range  No sign of bleed, CBC stable  Plan doppler Monday, consider thrombolytic vs Lovenox or Arixtra  Goal of Therapy:  Heparin level 0.3-0.7 units/ml Monitor platelets by anticoagulation protocol: Yes   Plan:   Continue Heparin at 1700 units/hr  Recheck Heparin level tonight at 8pm  Daily CBC,  PT/INR, Heparin level  Minda Ditto PharmD Pager [phone] 06/30/2013, 10:59 AM

## 2013-07-01 LAB — PROTIME-INR
INR: 1.77 — ABNORMAL HIGH (ref 0.00–1.49)
Prothrombin Time: 20.1 seconds — ABNORMAL HIGH (ref 11.6–15.2)

## 2013-07-01 LAB — HEPARIN LEVEL (UNFRACTIONATED)
Heparin Unfractionated: 0.68 IU/mL (ref 0.30–0.70)
Heparin Unfractionated: 0.67 IU/mL (ref 0.30–0.70)

## 2013-07-01 LAB — CBC
HCT: 37.3 % — ABNORMAL LOW (ref 39.0–52.0)
Hemoglobin: 12.2 g/dL — ABNORMAL LOW (ref 13.0–17.0)
MCH: 27.6 pg (ref 26.0–34.0)
MCHC: 32.7 g/dL (ref 30.0–36.0)
MCV: 84.4 fL (ref 78.0–100.0)
PLATELETS: 150 10*3/uL (ref 150–400)
RBC: 4.42 MIL/uL (ref 4.22–5.81)
RDW: 16.5 % — ABNORMAL HIGH (ref 11.5–15.5)
WBC: 5.1 10*3/uL (ref 4.0–10.5)

## 2013-07-01 MED ORDER — DOXAZOSIN MESYLATE 8 MG PO TABS
8.0000 mg | ORAL_TABLET | Freq: Every day | ORAL | Status: DC
  Administered 2013-07-01 – 2013-07-04 (×4): 8 mg via ORAL
  Filled 2013-07-01 (×5): qty 1

## 2013-07-01 MED ORDER — ATORVASTATIN CALCIUM 40 MG PO TABS
40.0000 mg | ORAL_TABLET | Freq: Every day | ORAL | Status: DC
  Administered 2013-07-01: 40 mg via ORAL
  Filled 2013-07-01 (×2): qty 1

## 2013-07-01 MED ORDER — FESOTERODINE FUMARATE ER 8 MG PO TB24
8.0000 mg | ORAL_TABLET | Freq: Every day | ORAL | Status: DC
  Filled 2013-07-01: qty 1

## 2013-07-01 MED ORDER — ACETAMINOPHEN 500 MG PO TABS: 1000.0000 mg | ORAL_TABLET | Freq: Four times a day (QID) | ORAL | Status: DC | PRN

## 2013-07-01 MED ORDER — FESOTERODINE FUMARATE ER 8 MG PO TB24
8.0000 mg | ORAL_TABLET | Freq: Two times a day (BID) | ORAL | Status: DC
  Administered 2013-07-01 – 2013-07-04 (×8): 8 mg via ORAL
  Filled 2013-07-01 (×11): qty 1

## 2013-07-01 NOTE — Progress Notes (Signed)
ANTICOAGULATION CONSULT NOTE - Follow Up Consult  Pharmacy Consult for Heparin Indication: resistant DVT, warfarin failure, s/p IVC filter  Allergies  Allergen Reactions  . Penicillins Other (See Comments)    HIVES     Patient Measurements: Height: 6\' 2"  (188 cm) Weight: 271 lb 9.7 oz (123.2 kg) IBW/kg (Calculated) : 82.2 Heparin Dosing Weight:   Vital Signs: Temp: 97.8 F (36.6 C) (04/19 0526) Temp src: Oral (04/19 0526) BP: 102/68 mmHg (04/19 0526) Pulse Rate: 72 (04/19 0526)  Labs:  Recent Labs  06/29/13 1000 06/29/13 1715  06/30/13 0300 06/30/13 0342 06/30/13 1012 06/30/13 1953 07/01/13 0539  HGB 12.7*  --   --   --  12.1*  --   --  12.2*  HCT 39.4  --   --   --  37.3*  --   --  37.3*  PLT 156  --   --   --  144*  --   --  150  APTT  --  36  --  159*  --   --   --   --   LABPROT  --  23.4*  --  23.5*  --   --   --   --   INR 2.5 2.16*  --  2.17*  --   --   --   --   HEPARINUNFRC  --   --   < > 0.57  --  0.62 0.74* 0.68  CREATININE  --  1.24  --   --   --   --   --   --   < > = values in this interval not displayed.  Estimated Creatinine Clearance: 65.2 ml/min (by C-G formula based on Cr of 1.24).   Medications:  Infusions:  . sodium chloride 10 mL/hr (06/29/13 1715)  . heparin 1,600 Units/hr (06/30/13 2045)    Assessment: Patient with heparin level at goal.  No issues per RN.  Goal of Therapy:  Heparin level 0.3-0.7 units/ml Monitor platelets by anticoagulation protocol: Yes   Plan:  Continue heparin at current rate Recheck level at [place]. 07/01/2013,6:50 AM

## 2013-07-01 NOTE — Progress Notes (Signed)
IP PROGRESS NOTE  Subjective:   No complaint. The left leg is still swollen. No bleeding.  Objective: Vital signs in last 24 hours: Blood pressure 102/68, pulse 72, temperature 97.8 F (36.6 C), temperature source Oral, resp. rate 18, height 6\' 2"  (1.88 m), weight 271 lb 9.7 oz (123.2 kg), SpO2 93.00%.  Intake/Output from previous day: 04/18 0701 - 04/19 0700 In: 1200 [P.O.:1200] Out: -   Physical Exam:  Lungs: Clear bilaterally Cardiac: Regular rate and rhythm, 2/6 systolic murmur Abdomen: No hepatosplenomegaly Extremities: Edema throughout the left leg     Lab Results:  Recent Labs  06/30/13 0342 07/01/13 0539  WBC 4.8 5.1  HGB 12.1* 12.2*  HCT 37.3* 37.3*  PLT 144* 150   heparin level 0.68  BMET  Recent Labs  06/29/13 1715  NA 143  K 4.0  CL 107  CO2 24  GLUCOSE 123*  BUN 22  CREATININE 1.24  CALCIUM 9.1    Studies/Results: X-ray Chest Pa And Lateral   06/29/2013   CLINICAL DATA:  Cough trauma weakness prominent history of hypertension and ex-smoker.  EXAM: CHEST  2 VIEW  COMPARISON:  07/27/2012  FINDINGS: Shallow inspiration with elevation of the left hemidiaphragm. There is new infiltration or atelectasis in the left mid lung which could indicate early pneumonia. Stable appearance of scarring in the lung bases. Heart size is normal. Tortuous and ectatic aorta. No pleural effusion. No pneumothorax. Degenerative changes throughout the thoracic spine.  IMPRESSION: Developing infiltration or atelectasis in the left mid lung since previous study.   Electronically Signed   By: Lucienne Capers M.D.   On: 06/29/2013 21:01   US Venous Img Lower Unilateral Left  06/29/2013   CLINICAL DATA:  Known left lower extremity DVT.  EXAM: LOWER EXTREMITY VENOUS DOPPLER ULTRASOUND  TECHNIQUE: Gray-scale sonography with graded compression, as well as color Doppler and duplex ultrasound were performed to evaluate the lower extremity deep venous systems from the level of the  common femoral vein and including the common femoral, femoral, profunda femoral, popliteal and calf veins including the posterior tibial, peroneal and gastrocnemius veins when visible. The superficial great saphenous vein was also interrogated. Spectral Doppler was utilized to evaluate flow at rest and with distal augmentation maneuvers in the common femoral, femoral and popliteal veins.  COMPARISON:  None.  FINDINGS: Common Femoral Vein: There is thrombus with lack of compressibility and augmentation.  Saphenofemoral Junction: There is thrombus with lack of compressibility and augmentation.  Profunda Femoral Vein: There is thrombus with lack of compressibility and augmentation.  Femoral Vein: There is thrombus with lack of compressibility and augmentation.  Popliteal Vein: There is thrombus with lack of compressibility and augmentation.  Calf Veins: Left posterior tibial and peroneal veins did not demonstrate color flow in the mid calf.  Other Findings:  None.  IMPRESSION: Deep venous thrombosis from left common femoral vein to popliteal vein with partial color flow.   Electronically Signed   By: Abelardo Diesel M.D.   On: 06/29/2013 10:13    Medications: I have reviewed the patient's current medications.  Assessment/Plan:  1. Left leg DVT-maintained on heparin, the heparin level is therapeutic  2. Left nephrolithotomy February 2015  He appears stable. The plan is to continue IV heparin.   LOS: 2 days   Ladell Pier  07/01/2013, 8:23 AM

## 2013-07-01 NOTE — Progress Notes (Signed)
ANTICOAGULATION CONSULT NOTE - Follow Up  Pharmacy Consult for IV heparin Indication: resistant DVT, warfarin failure, s/p IVC filter  Allergies  Allergen Reactions  . Penicillins Other (See Comments)    HIVES    Patient Measurements: Height: 6\' 2"  (188 cm) Weight: 271 lb 9.7 oz (123.2 kg) IBW/kg (Calculated) : 82.2 Heparin Dosing Weight: 110 kg  Vital Signs: Temp: 98.2 F (36.8 C) (04/19 1400) Temp src: Oral (04/19 1400) BP: 118/72 mmHg (04/19 1400) Pulse Rate: 89 (04/19 1400)  Labs:  Recent Labs  06/29/13 1000 06/29/13 1715 06/30/13 0300 06/30/13 0342  06/30/13 1953 07/01/13 0539 07/01/13 1440  HGB 12.7*  --   --  12.1*  --   --  12.2*  --   HCT 39.4  --   --  37.3*  --   --  37.3*  --   PLT 156  --   --  144*  --   --  150  --   APTT  --  36 159*  --   --   --   --   --   LABPROT  --  23.4* 23.5*  --   --   --  20.1*  --   INR 2.5 2.16* 2.17*  --   --   --  1.77*  --   HEPARINUNFRC  --   --  0.57  --   < > 0.74* 0.68 0.67  CREATININE  --  1.24  --   --   --   --   --   --   < > = values in this interval not displayed. Estimated Creatinine Clearance: 65.2 ml/min (by C-G formula based on Cr of 1.24).  Scheduled:  . atorvastatin  40 mg Oral q1800  . doxazosin  8 mg Oral QHS  . fesoterodine  8 mg Oral BID   Infusions:  . sodium chloride 10 mL/hr (06/29/13 1715)  . heparin 1,600 Units/hr (06/30/13 2045)   Assessment: 80 yo male direct admit from office visit to start IV heparin for a resistant DVT in left leg that happened post op renal stone removal and therefore failed warfarin outpatient per Md orders. Patient also s/p IVC filter. INR therapeutic this AM at 2.5 on home dose of warfarin of 5mg  daily except 2.5mg  on Mondays. No reported bleeding. Stable CBC  Began with 3000 unit IV heparin bolus and infusion at 1700 units/hr  Heparin levels therapeutic x1, third level 0.74 - Heparin reduced to 1600 units/hr - now therapeutic x2  INR 1.77 this am, below  therapeutic range  No sign of bleed, CBC stable  Plan doppler Monday, consider thrombolytic vs Lovenox or Arixtra  Goal of Therapy:  Heparin level 0.3-0.7 units/ml Monitor platelets by anticoagulation protocol: Yes   Plan:   Continue Heparin at 1600 units/hr  Daily CBC, PT/INR, Heparin level  Minda Ditto PharmD Pager [phone] 07/01/2013, 3:22 PM

## 2013-07-02 DIAGNOSIS — M7989 Other specified soft tissue disorders: Secondary | ICD-10-CM

## 2013-07-02 LAB — HEPARIN LEVEL (UNFRACTIONATED): HEPARIN UNFRACTIONATED: 0.68 [IU]/mL (ref 0.30–0.70)

## 2013-07-02 LAB — PROTIME-INR
INR: 1.31 (ref 0.00–1.49)
Prothrombin Time: 16 seconds — ABNORMAL HIGH (ref 11.6–15.2)

## 2013-07-02 LAB — CBC
HCT: 39.9 % (ref 39.0–52.0)
HEMOGLOBIN: 12.9 g/dL — AB (ref 13.0–17.0)
MCH: 27.6 pg (ref 26.0–34.0)
MCHC: 32.3 g/dL (ref 30.0–36.0)
MCV: 85.3 fL (ref 78.0–100.0)
PLATELETS: 155 10*3/uL (ref 150–400)
RBC: 4.68 MIL/uL (ref 4.22–5.81)
RDW: 16.8 % — ABNORMAL HIGH (ref 11.5–15.5)
WBC: 5.1 10*3/uL (ref 4.0–10.5)

## 2013-07-02 MED ORDER — SIMVASTATIN 40 MG PO TABS
80.0000 mg | ORAL_TABLET | Freq: Every day | ORAL | Status: DC
  Administered 2013-07-02 – 2013-07-04 (×3): 80 mg via ORAL
  Filled 2013-07-02: qty 2
  Filled 2013-07-02: qty 1
  Filled 2013-07-02 (×3): qty 2

## 2013-07-02 NOTE — Progress Notes (Signed)
[name] is doing okay. He is still on heparin. His left leg is still swollen. It may be a little bit better. He says that his left leg is a tender swollen ever since he had the surgery.  His walking. He says that the leg has not his whole him when he walks. There's no pain. There is no redness in the leg.  He's had no cough or shortness of breath. Is no nausea vomiting. He's had no problems with bowels or bladder.  There's been no bleeding.  Pharmacy is doing fantastic job in managing the heparin for his.  His vital signs are stable. Blood pressure 123/79. Lungs are clear. Cardiac exam regular rate and rhythm. Has a 1/6 systolic murmur. Abdomen is soft. Slightly obese. No fluid wave. There is no palpable liver or spleen tip. Extremities shows about 1+ nonpitting edema of the left leg. No erythema is noted. No venous cords noted. His decent range of motion. Right leg is unremarkable.  I will repeat a Doppler of his leg today. I would like to see if there is improvement in his thrombus. If there is no significant improvement, then we will probably need to get radiology to see about some type of localized therapy for the thrombus. I just do not want to have Mr. End up with post phlebitic syndrome that will affect his quality of life.  We will continue his heparin for now. Once we have a sense of response, then we will switch him over to Lovenox or Arixtra.  [name]  Romans 5:3-5

## 2013-07-02 NOTE — Progress Notes (Signed)
VASCULAR LAB PRELIMINARY  PRELIMINARY  PRELIMINARY  PRELIMINARY  Left lower extremity venous duplex completed.    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 There a non occlusive deep vein thrombosis of the saphenofemoral junction. A small segment of non occlusive superficial thrombus is noted in the proximal greater saphenous vein just distal to the saphenofemoral junction. The previous deep vein thrombosis of the popliteal vein appears to have resolved. The posterior tibial and peroneal veins of the mid to proximal calf were difficult to image due to the swelling however color flow is now present. There is no evidence of DVT in the distal peroneal and distal PTV. There is no propagation to the common femoral or saphenofemoral junction. Rouleaux flow is noted in this area.  [place], RVS 07/02/2013, 10:00 AM

## 2013-07-02 NOTE — Consult Note (Signed)
Reason for Consult:(L)LE DVT Consulting Radiologist: Shick Referring Physician: Marin Olp   HPI: [name] is an 80 y.o. male with hx of extensive (L)LE DVT back in 04/2013. IR placed IVC filter at that time but pt was placed on IV heparin and subsequently transitioned to Coumadin. He has been readmitted for IV heparin due to recurrent edema of the (L)LE. A repeat duplex was ordered and IR was asked to eval for any possibility of lytic therapy. Chart, PMHx, meds reviewed. Pt states he does have some compression stockings, but is uncertain how often he actually wears them. Since admission and IV heparin started, he does feel much better and feels like his (L)LE edema is improved.  He denies CP, SOB, palpitations.  Past Medical History:  Past Medical History  Diagnosis Date  . Hypertension   . BPH (benign prostatic hyperplasia)   . Hyperlipidemia   . Cancer 2008-2009    prostate TREATED WITH RADIATION  . AAA (abdominal aortic aneurysm) 04/2012    STENTING OF AAA IN [place]  . Junctional cardiac arrhythmia     Occurred postoperatively after urologic surgery  . Severe sinus bradycardia     Occurred postoperatively after urologic surgery  . Aortic valve disorders   . Other primary cardiomyopathies     Surgical History:  Past Surgical History  Procedure Laterality Date  . Abdominal surgery      PART OF COLON REMOVED FOR DIVERTICULITIS  . Appendectomy    . Hernia repair    . Bladder surgery  2008    FOR BLADDER STONE  . Total knee arthroplasty  1990    left  . Abdominal aortic aneurysm repair  04/2012  . Total knee arthroplasty Right 07/26/2012    Procedure: RIGHT TOTAL KNEE ARTHROPLASTY;  Surgeon: Tobi Bastos, MD;  Location: [facility];  Service: Orthopedics;  Laterality: Right;  . Nephrolithotomy Left 04/26/2013    Procedure: LEFT PERCUTANEOUS NEPHROLITHOTOMY ;  Surgeon: [name], MD;  Location: [facility];  Service: Urology;  Laterality: Left;  . Nephrolithotomy Left  05/03/2013    Procedure: 2ND STAGE LEFT PERCUTANEOUS NEPHROLITHOTOMY ;  Surgeon: [name], MD;  Location: [facility];  Service: Urology;  Laterality: Left;    Family History:  Family History  Problem Relation Age of Onset  . Lung cancer Father     Social History:  reports that he quit smoking about 28 years ago. His smoking use included Cigarettes. He started smoking about 64 years ago. He has a 74 pack-year smoking history. He has never used smokeless tobacco. He reports that he does not drink alcohol or use illicit drugs.  Allergies:  Allergies  Allergen Reactions  . Penicillins Other (See Comments)    HIVES     Medications:  Continuous: . sodium chloride 10 mL/hr (06/29/13 1715)  . heparin 1,600 Units/hr (07/02/13 1100)    ROS: See HPI for pertinent findings, otherwise complete 10 system review negative.  Physical Exam: Blood pressure 127/89, pulse 80, temperature 98 F (36.7 C), temperature source Oral, resp. rate 20, height 6' 2"  (1.88 m), weight 270 lb 8 oz (122.698 kg), SpO2 100.00%. Lungs: CTA without w/r/r Heart: Regular ExtL (R) normal (L)LE with 2+ soft, non-pitting edema. No tenderness. Foot warm.    Labs: CBC  Recent Labs  07/01/13 0539 07/02/13 0438  WBC 5.1 5.1  HGB 12.2* 12.9*  HCT 37.3* 39.9  PLT 150 155   MET  Recent Labs  06/29/13 1715  NA 143  K 4.0  CL 107  CO2 24  GLUCOSE 123*  BUN 22  CREATININE 1.24  CALCIUM 9.1    Recent Labs  06/29/13 1715  PROT 6.2  ALBUMIN 3.1*  AST 17  ALT 12  ALKPHOS 67  BILITOT 0.3   PT/INR  Recent Labs  07/01/13 0539 07/02/13 0438  LABPROT 20.1* 16.0*  INR 1.77* 1.31     Assessment/Plan: (L)LE DVT diagnosed in 04/2013. Follow up Venous study today finds improved flow with the tibial, popliteal and deep femoral venous system patent again. He does have residual chronic thrombus in the SFV and CFV with no propagation. Pt ssen and discussed with Dr. Annamaria Boots. Given his clinical improvement  (albeit slow), and duplex findings, we would not recommend lysis at this point. Chronic thrombus does not really respond to lytic therapy like fresh acute thrombus does and would expect minimal change with therapy. Recommend continued anticoagulation, leg elevation and compression stockings essentially at all times except in bed.  Ascencion Dike PA-C 07/02/2013, 4:54 PM

## 2013-07-02 NOTE — Progress Notes (Signed)
ANTICOAGULATION CONSULT NOTE - Follow Up  Pharmacy Consult for IV heparin Indication: persistent DVT LLE, warfarin failure, s/p IVC filter placement  Allergies  Allergen Reactions  . Penicillins Other (See Comments)    HIVES    Patient Measurements: Height: 6\' 2"  (188 cm) Weight: 270 lb 8 oz (122.698 kg) IBW/kg (Calculated) : 82.2 Heparin Dosing Weight: 110 kg  Vital Signs: Temp: 98.1 F (36.7 C) (04/20 0520) Temp src: Oral (04/20 0520) BP: 123/79 mmHg (04/20 0520) Pulse Rate: 78 (04/20 0520)  Labs:  Recent Labs  06/29/13 1715 06/30/13 0300  06/30/13 0342  07/01/13 0539 07/01/13 1440 07/02/13 0438  HGB  --   --   < > 12.1*  --  12.2*  --  12.9*  HCT  --   --   --  37.3*  --  37.3*  --  39.9  PLT  --   --   --  144*  --  150  --  155  APTT 36 159*  --   --   --   --   --   --   LABPROT 23.4* 23.5*  --   --   --  20.1*  --  16.0*  INR 2.16* 2.17*  --   --   --  1.77*  --  1.31  HEPARINUNFRC  --  0.57  --   --   < > 0.68 0.67 0.68  CREATININE 1.24  --   --   --   --   --   --   --   < > = values in this interval not displayed. Estimated Creatinine Clearance: 65 ml/min (by C-G formula based on Cr of 1.24).  Scheduled:  . atorvastatin  40 mg Oral q1800  . doxazosin  8 mg Oral QHS  . fesoterodine  8 mg Oral BID   Infusions:  . sodium chloride 10 mL/hr (06/29/13 1715)  . heparin 1,600 Units/hr (07/01/13 1627)   Assessment: 80 yo male direct admit from office visit to start IV heparin for a DVT in left leg that occurred post op (renal stone removal) and has persisted despite heparin/LMWH followed by outpatient warfarin therapy with a therapeutic INR. Patient also s/p IVC filter placement. INR therapeutic on admission.  May consider catheter-directed thrombolytic therapy given the extent of thrombosis.   Orders received to begin IV heparin with pharmacy dosing assistance.  4/20:  Heparin level therapeutic (0.68) on 1600 units/hr IV infusion.  H/H, pltc stable.  No  bleeding noted per patient and MD report.  For repeat venous doppler today.  If no improvement in thrombus, MD may consider catheter-directed thrombolytic therapy by IR to reduce likelihood of extensive post-phlebitic syndrome.  Goal of Therapy:  Heparin level 0.3-0.7 units/ml Monitor platelets by anticoagulation protocol: Yes   Plan:   Continue Heparin at 1600 units/hr  Daily CBC, PT/INR, Heparin level  Await findings from repeat venous doppler study today.  Clayburn Pert, PharmD, BCPS Pager: [phone] 07/02/2013  8:28 AM

## 2013-07-03 LAB — PROTIME-INR
INR: 1.22 (ref 0.00–1.49)
Prothrombin Time: 15.1 seconds (ref 11.6–15.2)

## 2013-07-03 LAB — CBC
HCT: 37.6 % — ABNORMAL LOW (ref 39.0–52.0)
Hemoglobin: 12.2 g/dL — ABNORMAL LOW (ref 13.0–17.0)
MCH: 28 pg (ref 26.0–34.0)
MCHC: 32.4 g/dL (ref 30.0–36.0)
MCV: 86.2 fL (ref 78.0–100.0)
PLATELETS: 141 10*3/uL — AB (ref 150–400)
RBC: 4.36 MIL/uL (ref 4.22–5.81)
RDW: 16.5 % — AB (ref 11.5–15.5)
WBC: 4.9 10*3/uL (ref 4.0–10.5)

## 2013-07-03 LAB — BASIC METABOLIC PANEL
BUN: 19 mg/dL (ref 6–23)
CALCIUM: 9.1 mg/dL (ref 8.4–10.5)
CO2: 26 mEq/L (ref 19–32)
CREATININE: 1.1 mg/dL (ref 0.50–1.35)
Chloride: 106 mEq/L (ref 96–112)
GFR calc Af Amer: 71 mL/min — ABNORMAL LOW (ref >90)
GFR calc non Af Amer: 61 mL/min — ABNORMAL LOW (ref >90)
Glucose, Bld: 90 mg/dL (ref 70–99)
Potassium: 4 mEq/L (ref 3.7–5.3)
SODIUM: 141 meq/L (ref 137–147)

## 2013-07-03 LAB — HEPARIN LEVEL (UNFRACTIONATED): Heparin Unfractionated: 0.59 IU/mL (ref 0.30–0.70)

## 2013-07-03 NOTE — Progress Notes (Signed)
[name] is improving slowly. His  left leg is not swollen. I did do a no other Doppler. I have this looked at by radiology. They said that the Doppler did look better. There was less thrombus in the left leg. There is chronic thrombus in the left thigh but this appears to be opening a little bit better.  He is doing well the heparin.  He will need measured compression stockings as an outpatient. This will have to be done once is an outpatient.  As an outpatient, I will either try to get him on Xarelto or Lovenox or Arixtra. We will have case manager and see how much it will cost. I cannot let him go home until I know what he can afford.  He is ambulating. He's not having pain or increased swelling when he ambulates with a left leg. His appetite is doing good. There is no bleeding. He's had no headache.  His vital signs are stable. Blood pressure is 139/89. Temperature is 97.8. Pulse is 83. Lungs are clear. Cardiac exam regular rate and rhythm. Has a 1/6 murmur. Abdomen is soft. Has good bowel sounds. Extremities shows chronic mild edema of the left leg. No venous cords noted. Right leg is unremarkable.  We are doing well with heparin. Again, I will continue him on heparin until we know exactly what I can put him on as an outpatient     If all goes well, then we can discharge him in 2 or 3 days.  I appreciate the great care that he is getting from the staff on [place] 91:1-2

## 2013-07-03 NOTE — Progress Notes (Signed)
ANTICOAGULATION CONSULT NOTE - Follow Up  Pharmacy Consult for IV heparin Indication: persistent DVT LLE, warfarin failure, s/p IVC filter placement  Allergies  Allergen Reactions  . Penicillins Other (See Comments)    HIVES    Patient Measurements: Height: 6\' 2"  (188 cm) Weight: 266 lb 9.6 oz (120.929 kg) IBW/kg (Calculated) : 82.2 Heparin Dosing Weight: 110 kg  Vital Signs: Temp: 97.8 F (36.6 C) (04/21 0535) Temp src: Oral (04/21 0535) BP: 139/89 mmHg (04/21 0535) Pulse Rate: 83 (04/21 0535)  Labs:  Recent Labs  07/01/13 0539 07/01/13 1440 07/02/13 0438 07/03/13 0350  HGB 12.2*  --  12.9* 12.2*  HCT 37.3*  --  39.9 37.6*  PLT 150  --  155 141*  LABPROT 20.1*  --  16.0* 15.1  INR 1.77*  --  1.31 1.22  HEPARINUNFRC 0.68 0.67 0.68 0.59  CREATININE  --   --   --  1.10   Estimated Creatinine Clearance: 72.8 ml/min (by C-G formula based on Cr of 1.1).  Scheduled:  . doxazosin  8 mg Oral QHS  . fesoterodine  8 mg Oral BID  . simvastatin  80 mg Oral q1800   Infusions:  . sodium chloride 10 mL/hr (06/29/13 1715)  . heparin 1,600 Units/hr (07/03/13 0113)   Assessment: 80 yo male direct admit from office visit to start IV heparin for a DVT in left leg that occurred post op (renal stone removal) and has persisted despite heparin/LMWH followed by outpatient warfarin therapy with a therapeutic INR. Patient also s/p IVC filter placement. INR therapeutic on admission. Orders received to begin IV heparin with pharmacy dosing assistance.  4/21:  Heparin level therapeutic (0.59) on 1600 units/hr IV infusion.  H/H, pltc stable.  No bleeding noted per patient and MD report.  Radiology does not recommend lytic therapy at this time. Continue anticoagulation.  Goal of Therapy:  Heparin level 0.3-0.7 units/ml Monitor platelets by anticoagulation protocol: Yes   Plan:   Continue Heparin at 1600 units/hr  Daily CBC, PT/INR, Heparin level  Kizzie Furnish, PharmD Pager:  [phone]  07/03/2013  7:13 AM

## 2013-07-03 NOTE — Progress Notes (Signed)
Benefit check for co-payments done as ordered by MD. This is what it revealed.  PER [facility] PTH HAS A DEDUCTABLE OF $320.00HE HAS MET $15.98 AND HAS A BALANCE OF $304.00 TO MET ONCE THAT IS MET HIS XARELTO WILL BE $20.00 FOR 30 DAY SUPPLY, ARIXTRA WILL BE $100.00 FOR 30 DAY SUPPLY, LOVENOX WILL BE $100.00 FOR 30 DAY SUPPLY. XARELTO REQUIRES PRIOR AUTH P#[id number] OPT.1  [place]   [phone]

## 2013-07-03 NOTE — Care Management Note (Signed)
    Page 1 of 1   07/03/2013     4:11:34 PM CARE MANAGEMENT NOTE 07/03/2013  Patient:  [name], [name]   Account Number:  [id number]  Date Initiated:  07/03/2013  Documentation initiated by:  [facility]  Subjective/Objective Assessment:   80 year old male admitted with resistant blood clot in left leg.     Action/Plan:   From home.   Anticipated DC Date:  07/06/2013   Anticipated DC Plan:  [place]  CM consult      Choice offered to / List presented to:             Status of service:  In process, will continue to follow Medicare Important Message given?   (If response is "NO", the following Medicare IM given date fields will be blank) Date Medicare IM given:   Date Additional Medicare IM given:    Discharge Disposition:    Per UR Regulation:  Reviewed for med. necessity/level of care/duration of stay  If discussed at [place] of Stay Meetings, dates discussed:    Comments:  07/03/13 Allene Dillon RN BSN [phone] Benefits check done for co-payment of anticoagulants per MD order. See separate note in regards to this.

## 2013-07-04 LAB — CBC
HCT: 39 % (ref 39.0–52.0)
Hemoglobin: 12.3 g/dL — ABNORMAL LOW (ref 13.0–17.0)
MCH: 27.3 pg (ref 26.0–34.0)
MCHC: 31.5 g/dL (ref 30.0–36.0)
MCV: 86.5 fL (ref 78.0–100.0)
PLATELETS: 144 10*3/uL — AB (ref 150–400)
RBC: 4.51 MIL/uL (ref 4.22–5.81)
RDW: 16.6 % — AB (ref 11.5–15.5)
WBC: 4.9 10*3/uL (ref 4.0–10.5)

## 2013-07-04 LAB — PROTIME-INR
INR: 1.14 (ref 0.00–1.49)
Prothrombin Time: 14.4 seconds (ref 11.6–15.2)

## 2013-07-04 LAB — HEPARIN LEVEL (UNFRACTIONATED): HEPARIN UNFRACTIONATED: 0.46 [IU]/mL (ref 0.30–0.70)

## 2013-07-04 MED ORDER — RIVAROXABAN 15 MG PO TABS
15.0000 mg | ORAL_TABLET | Freq: Two times a day (BID) | ORAL | Status: AC
  Administered 2013-07-04 (×2): 15 mg via ORAL
  Filled 2013-07-04 (×2): qty 1

## 2013-07-04 MED ORDER — RIVAROXABAN 15 MG PO TABS
15.0000 mg | ORAL_TABLET | Freq: Two times a day (BID) | ORAL | Status: DC
  Administered 2013-07-05: 15 mg via ORAL
  Filled 2013-07-04 (×3): qty 1

## 2013-07-04 MED ORDER — ENOXAPARIN SODIUM 120 MG/0.8ML ~~LOC~~ SOLN
120.0000 mg | Freq: Once | SUBCUTANEOUS | Status: AC
  Administered 2013-07-04: 120 mg via SUBCUTANEOUS
  Filled 2013-07-04: qty 0.8

## 2013-07-04 NOTE — Progress Notes (Signed)
[name] is doing okay. I do appreciate case management in figuring out what he can afford as an outpatient. It looks like Xarelto would be easiest.  He will start this today. We will do the 15 mg twice a day dose.  His heparin was stopped already. He needed to have a heparin going for another 4 hours or so until the Xarelto started to work. As such, I will give him one dose of Lovenox at 120 mg.  His left leg still is a little swollen but this is chronic. It is soft. Is not painful. He is wearing a compression stocking.  He is walking. There's no pain. He's had no bleeding. He's had no nausea or vomiting.  There's been no problems with bowels or bladder.  His vital signs look good. Blood pressure 115/70. Temperature 97.8. Head and neck exam shows no ocular or oral lesions. There's no palpable lymph nodes in the neck. Lungs are clear. Cardiac exam regular rate and rhythm. He's a 1/6 murmur. Abdomen soft. Mildly obese. No fluid wave. No palpable liver or spleen. Extremities shows mild nonpitting edema of the left leg. Has good strength. Has good range of motion of his joints. Skin exam no rashes. No ecchymosis or petechia. Neurological exam is nonfocal.  We will go ahead and see how he does today. I will then plan to let him go home tomorrow.  He clearly will need a fitted compression stocking for the left leg. This probably can be done as an outpatient.  I did leave a message for his daughter [name] yesterday. I updated her as to what was going on.  Harriette Ohara 33:3

## 2013-07-04 NOTE — Discharge Instructions (Signed)
Information on my medicine - XARELTO (rivaroxaban)  This medication education was reviewed with me or my healthcare representative as part of my discharge preparation.  The pharmacist that spoke with me during my hospital stay was:  Joycelyn Rua, [place]? Xarelto was prescribed to treat blood clots that may have been found in the veins of your legs (deep vein thrombosis) or in your lungs (pulmonary embolism) and to reduce the risk of them occurring again.  What do you need to know about Xarelto? The starting dose is one 15 mg tablet taken TWICE daily with food for the FIRST 21 DAYS, then the dose is changed to one 20 mg tablet taken ONCE A DAY with your evening meal.  DO NOT stop taking Xarelto without talking to the health care provider who prescribed the medication.  Refill your prescription for 20 mg tablets before you run out.  After discharge, you should have regular check-up appointments with your healthcare provider that is prescribing your Xarelto.  In the future your dose may need to be changed if your kidney function changes by a significant amount.  What do you do if you miss a dose? If you are taking Xarelto TWICE DAILY and you miss a dose, take it as soon as you remember. You may take two 15 mg tablets (total 30 mg) at the same time then resume your regularly scheduled 15 mg twice daily the next day.  If you are taking Xarelto ONCE DAILY and you miss a dose, take it as soon as you remember on the same day then continue your regularly scheduled once daily regimen the next day. Do not take two doses of Xarelto at the same time.   Important Safety Information Xarelto is a blood thinner medicine that can cause bleeding. You should call your healthcare provider right away if you experience any of the following:   Bleeding from an injury or your nose that does not stop.   Unusual colored urine (red or dark brown) or unusual colored stools  (red or black).   Unusual bruising for unknown reasons.   A serious fall or if you hit your head (even if there is no bleeding).  Some medicines may interact with Xarelto and might increase your risk of bleeding while on Xarelto. To help avoid this, consult your healthcare provider or pharmacist prior to using any new prescription or non-prescription medications, including herbals, vitamins, non-steroidal anti-inflammatory drugs (NSAIDs) and supplements.  This website has more information on Xarelto: [email or web address].[email or web address]

## 2013-07-05 ENCOUNTER — Other Ambulatory Visit: Payer: Self-pay | Admitting: *Deleted

## 2013-07-05 LAB — CBC
HCT: 36.6 % — ABNORMAL LOW (ref 39.0–52.0)
Hemoglobin: 12 g/dL — ABNORMAL LOW (ref 13.0–17.0)
MCH: 27.8 pg (ref 26.0–34.0)
MCHC: 32.8 g/dL (ref 30.0–36.0)
MCV: 84.7 fL (ref 78.0–100.0)
PLATELETS: 156 10*3/uL (ref 150–400)
RBC: 4.32 MIL/uL (ref 4.22–5.81)
RDW: 16.6 % — AB (ref 11.5–15.5)
WBC: 4.6 10*3/uL (ref 4.0–10.5)

## 2013-07-05 MED ORDER — RIVAROXABAN 15 MG PO TABS: 15.0000 mg | ORAL_TABLET | Freq: Two times a day (BID) | ORAL | Status: DC

## 2013-07-05 NOTE — Progress Notes (Signed)
Reviewed discharge instructions/prescription with patient and his daughter.  Already a participant in "My Chart".  Both verbalized understanding

## 2013-07-05 NOTE — Telephone Encounter (Signed)
Called [facility] to transfer Xarelto Rx to [facility].

## 2013-07-05 NOTE — Discharge Summary (Signed)
NAME:  [name], [name] NO.:  [id number]  MEDICAL RECORD NO.:  [id number]  LOCATION:  8182                         FACILITY:  [facility]  PHYSICIAN:  [name], M.D.  DATE OF BIRTH:  10-04-1929  DATE OF ADMISSION:  06/29/2013 DATE OF DISCHARGE:  07/05/2013                              DISCHARGE SUMMARY   DIAGNOSES UPON DISCHARGE: 1. 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 2. Hypertension. 3. Hyperlipidemia. 4. Prostate enlargement. 5. Hearing impaired.  CONDITION UPON DISCHARGE:  Stable.  ACTIVITIES:  As tolerated.  DIET:  No restrictions.  FOLLOWUP:  The patient will follow up with Dr. Marin Olp on Jul 20, 2013.  MEDICATIONS:  His medications upon discharge are: 1. Xarelto 15 mg p.o. b.i.d. 2. Cardura 8 mg p.o. at bedtime. 3. Zocor 80 mg p.o. daily. 4. Detrol LA 4 mg p.o. b.i.d. 5. Tylenol as needed.  HOSPITAL COURSE:  Mr. Camargo was admitted from the office.  He had been on Coumadin for a left lower extremity DVT.  His leg was much more swollen when I saw him in the office.  A Doppler was done, which showed extensive thrombus in the left leg.  I felt we had to admit him to try to help prevent post phlebitic syndrome.  We started him on heparin.  His leg began to improve.  Pharmacy was very good in helping [place] out with monitoring his heparin and managing the dose.  I did go ahead and repeat a Doppler on the 20th.  This did show improvement.  He did have chronic thrombus in the left femoral and common femoral vein.  There was resolution of thrombus in the popliteal vein.  There was nonocclusive thrombus at the saphenofemoral junction.  His left leg swelling improved nicely.  He is ambulating.  We did not think that any type of thrombolytic  therapy was indicated. Radiology came up to evaluate him, and I was very grateful for their input.  We continued him on heparin for about 5 days.  We then converted him over to Xarelto.  Case  management helped [place] out in figuring out what he could pay for as an outpatient.  He was able to afford Xarelto which was 30 dollars a month.  INTERIM HISTORY:  He had no problems with bleeding.  He is eating well. There is no pain.  He had no problems with going to the bathroom.  I felt that we probably could discharge him on 23rd. He is feeling well.  DISCHARGE PHYSICAL EXAMINATION:  VITAL SIGNS:  Upon discharge, all his vital signs were stable.  Temperature 98.  Blood pressure 134/80.  Pulse 89. HEAD AND NECK:  Shows no ocular or oral lesions.  There are no palpable cervical or supraclavicular lymph nodes. LUNGS:  Clear bilaterally. CARDIAC:  Regular rate and rhythm with a normal S1 and S2.  He has a 1/6 systolic ejection murmur.  ABDOMEN:  Soft.  He has good bowel sounds. There is no palpable abdominal mass.  There was no palpable hepatosplenomegaly. BACK:  No tenderness over the spine, ribs, or hips.  EXTREMITIES:  Show chronic nonpitting edema of the left leg.  It is  soft.  No venous cord is noted.  He has good pulses.  Right leg is unremarkable. NEUROLOGICAL:  Shows no focal neurological deficits.     [name], M.D.     PRE/MEDQ  D:  07/05/2013  T:  07/05/2013  Job:  [id number]

## 2013-07-05 NOTE — Discharge Summary (Signed)
#   [id number] is d/c summary.  Elijah E.  Acts 2:21

## 2013-07-10 ENCOUNTER — Telehealth: Payer: Self-pay | Admitting: Hematology & Oncology

## 2013-07-10 ENCOUNTER — Other Ambulatory Visit: Payer: Self-pay | Admitting: Hematology & Oncology

## 2013-07-10 DIAGNOSIS — I82409 Acute embolism and thrombosis of unspecified deep veins of unspecified lower extremity: Secondary | ICD-10-CM

## 2013-07-10 NOTE — Telephone Encounter (Signed)
Per MD to sch 07/20/13 apt for patient and to call daughter.  [name] was called, there was no answer so i left message on VM to call back to receive her fathers apt

## 2013-07-12 ENCOUNTER — Encounter: Payer: Medicare Other | Admitting: Interventional Cardiology

## 2013-07-20 ENCOUNTER — Ambulatory Visit (HOSPITAL_BASED_OUTPATIENT_CLINIC_OR_DEPARTMENT_OTHER): Payer: Medicare Other | Admitting: Hematology & Oncology

## 2013-07-20 ENCOUNTER — Telehealth: Payer: Self-pay | Admitting: Cardiovascular Disease

## 2013-07-20 ENCOUNTER — Encounter: Payer: Self-pay | Admitting: Hematology & Oncology

## 2013-07-20 ENCOUNTER — Other Ambulatory Visit (HOSPITAL_BASED_OUTPATIENT_CLINIC_OR_DEPARTMENT_OTHER): Payer: Medicare Other | Admitting: Lab

## 2013-07-20 VITALS — BP 113/67 | HR 67 | Temp 98.3°F | Resp 18 | Ht 72.0 in | Wt 277.0 lb

## 2013-07-20 DIAGNOSIS — I82409 Acute embolism and thrombosis of unspecified deep veins of unspecified lower extremity: Secondary | ICD-10-CM

## 2013-07-20 DIAGNOSIS — Z7901 Long term (current) use of anticoagulants: Secondary | ICD-10-CM

## 2013-07-20 DIAGNOSIS — I10 Essential (primary) hypertension: Secondary | ICD-10-CM

## 2013-07-20 LAB — CBC WITH DIFFERENTIAL (CANCER CENTER ONLY)
BASO#: 0.1 10*3/uL (ref 0.0–0.2)
BASO%: 1 % (ref 0.0–2.0)
EOS ABS: 0.2 10*3/uL (ref 0.0–0.5)
EOS%: 3.9 % (ref 0.0–7.0)
HEMATOCRIT: 41.2 % (ref 38.7–49.9)
HGB: 13.1 g/dL (ref 13.0–17.1)
LYMPH#: 1.2 10*3/uL (ref 0.9–3.3)
LYMPH%: 23.9 % (ref 14.0–48.0)
MCH: 28.1 pg (ref 28.0–33.4)
MCHC: 31.8 g/dL — ABNORMAL LOW (ref 32.0–35.9)
MCV: 88 fL (ref 82–98)
MONO#: 0.6 10*3/uL (ref 0.1–0.9)
MONO%: 11.2 % (ref 0.0–13.0)
NEUT#: 2.9 10*3/uL (ref 1.5–6.5)
NEUT%: 60 % (ref 40.0–80.0)
PLATELETS: 149 10*3/uL (ref 145–400)
RBC: 4.66 10*6/uL (ref 4.20–5.70)
RDW: 16.1 % — ABNORMAL HIGH (ref 11.1–15.7)
WBC: 4.9 10*3/uL (ref 4.0–10.0)

## 2013-07-20 LAB — D-DIMER, QUANTITATIVE (NOT AT ARMC): D DIMER QUANT: 1.57 ug{FEU}/mL — AB (ref 0.00–0.48)

## 2013-07-20 MED ORDER — RIVAROXABAN 20 MG PO TABS: 20.0000 mg | ORAL_TABLET | Freq: Every day | ORAL | Status: DC

## 2013-07-20 NOTE — Progress Notes (Signed)
Hematology and Oncology Follow Up Visit  [name] [id number] 02/01/1930 80 y.o. 07/20/2013   Principle Diagnosis:   DVT of the left leg-Coumadin resistance  Current Therapy:    Xarelto 20 mg by mouth daily     Interim History:  Mr.  [name] is back for followup. He was in the hospital recently. We last saw him here, his leg was left leg was quite swollen. A repeat Doppler showed extensive thrombus. We admitted him. We got back on heparin. His leg improved. We then transitioned him over to Xarelto.  He goes to [facility] on May 11 for intra-abdominal aneurysm surgery. I think this will  be done by one of the vascular surgeons. We will have [name] stop the Xarelto 3 days beforehand.  Medications: Current outpatient prescriptions:acetaminophen (TYLENOL) 500 MG tablet, Take 1,000 mg by mouth every 6 (six) hours as needed for mild pain or moderate pain., Disp: , Rfl: ;  doxazosin (CARDURA) 8 MG tablet, Take 8 mg by mouth at bedtime. , Disp: , Rfl: ;  Naphazoline-Glycerin-Zinc Sulf (CLEAR EYES MAXIMUM ITCHY EYE OP), Place 2 drops into the left eye as needed (for reddness in eye)., Disp: , Rfl:  Rivaroxaban (XARELTO) 20 MG TABS tablet, Take 1 tablet (20 mg total) by mouth daily with supper., Disp: 30 tablet, Rfl: 3;  simvastatin (ZOCOR) 80 MG tablet, Take 80 mg by mouth every evening., Disp: , Rfl: ;  tolterodine (DETROL LA) 4 MG 24 hr capsule, Take 4 mg by mouth 2 (two) times daily. , Disp: , Rfl:   Allergies:  Allergies  Allergen Reactions  . Penicillins Other (See Comments)    HIVES     Past Medical History, Surgical history, Social history, and Family History were reviewed and updated.  Review of Systems: As above  Physical Exam:  height is 6' (1.829 m) and weight is 277 lb (125.646 kg). His oral temperature is 98.3 F (36.8 C). His blood pressure is 113/67 and his pulse is 67. His respiration is 18.   Elderly gentleman. Lungs are clear. Cardiac exam regular in rhythm. He  has a 2/6 murmur. Abdomen soft. Mildly obese. Has good bowel sounds. Extremity shows mild chronic nonpitting edema of the left leg. No venous cord is noted. Right leg has minimal edema. His good range motion of his joints. Skin exam no rashes. Neurological exam is nonfocal.  Lab Results  Component Value Date   WBC 4.9 07/20/2013   HGB 13.1 07/20/2013   HCT 41.2 07/20/2013   MCV 88 07/20/2013   PLT 149 07/20/2013     Chemistry      Component Value Date/Time   NA 141 07/03/2013 0350   K 4.0 07/03/2013 0350   CL 106 07/03/2013 0350   CO2 26 07/03/2013 0350   BUN 19 07/03/2013 0350   CREATININE 1.10 07/03/2013 0350      Component Value Date/Time   CALCIUM 9.1 07/03/2013 0350   ALKPHOS 67 06/29/2013 1715   AST 17 06/29/2013 1715   ALT 12 06/29/2013 1715   BILITOT 0.3 06/29/2013 1715         Impression and Plan: [name] is 80 year old gentleman. He has thrombus of the left leg. This was an extensive thrombus. We initially tried him on Coumadin. Unfortunately this did not help. We now have on Xarelto.  I believe that he is doing well. The leg has always been somewhat swollen him before he had the thrombus. As such, we will always have a  leg that we'll be a little larger than the other.  We will go ahead and plan to see him back probably in about 6 weeks. He will have recovered from his surgery. He will have a Doppler of his left leg the same day that I see him  Elijah Napoleon, MD 5/8/201512:24 PM

## 2013-07-20 NOTE — Telephone Encounter (Signed)
New problem   Pt's daughter want pt to been seen Monday due to pt is having surgery 07/25/13 at [facility]. Please call pt's daughter.

## 2013-07-20 NOTE — Telephone Encounter (Signed)
SPOKE WITH  DAUGHTER  PT HAS  NEVER  SEEN   DR Johnsie Cancel  ATTEMPTED TO   MAKE  APPT ON MON   WITH  ANOTHER  CARD. NOTHING  AVAILABLE  DAUGHTER  TO  SPEAK  WITH REPRESENTATIVE   FOR [facility]  SURGEON'S OFFICE  THIS  EVENING  WILL LET  KNOW NO  APPT  AVAILABLE   MAY  NEED TO RESCHEDULE SURGERY .Adonis Housekeeper

## 2013-07-23 NOTE — Telephone Encounter (Signed)
F/u   Pt want to speak to nurse concerning previous message

## 2013-07-23 NOTE — Telephone Encounter (Signed)
PER PT'S  DAUGHTER  WILL KEEP  APPT ON  08-03-13   PT  HAS  AN  ECHO  SCHEDULED   FOR  TOM  AT [facility]  PT  MAY  HAVE  SURGERY  POSTPONED./CY

## 2013-07-24 ENCOUNTER — Encounter: Payer: Medicare Other | Admitting: Cardiovascular Disease

## 2013-07-31 ENCOUNTER — Telehealth: Payer: Self-pay | Admitting: Hematology & Oncology

## 2013-07-31 NOTE — Telephone Encounter (Signed)
Patient's daughter called and cx 07/31/13 apt and resch for 08/03/13

## 2013-07-31 NOTE — Telephone Encounter (Signed)
[name] called back and change apt back to the 19th

## 2013-08-03 ENCOUNTER — Ambulatory Visit (INDEPENDENT_AMBULATORY_CARE_PROVIDER_SITE_OTHER): Payer: Medicare Other | Admitting: Cardiovascular Disease

## 2013-08-03 VITALS — BP 151/91 | HR 78 | Ht 74.0 in | Wt 280.8 lb

## 2013-08-03 DIAGNOSIS — I495 Sick sinus syndrome: Secondary | ICD-10-CM

## 2013-08-03 DIAGNOSIS — I714 Abdominal aortic aneurysm, without rupture, unspecified: Secondary | ICD-10-CM

## 2013-08-03 DIAGNOSIS — I359 Nonrheumatic aortic valve disorder, unspecified: Secondary | ICD-10-CM

## 2013-08-03 DIAGNOSIS — I10 Essential (primary) hypertension: Secondary | ICD-10-CM

## 2013-08-03 DIAGNOSIS — R001 Bradycardia, unspecified: Secondary | ICD-10-CM

## 2013-08-03 DIAGNOSIS — I82409 Acute embolism and thrombosis of unspecified deep veins of unspecified lower extremity: Secondary | ICD-10-CM

## 2013-08-03 NOTE — Progress Notes (Signed)
Patient ID: [name], male   DOB: [birth year], 80 y.o.   MRN: [id number] 80 year old patient of Dr Irish Lack.  Just seen 2/15 for bradycardia.  Not clear why he is seeing me today.  Patient has seen surgeon at [facility] who wants to fix AAA with stent graft revision  History of prostate cancer and abdominal aortic aneurysm, status post stent graft. He was admitted for problems with kidney stones.in February 2015 He had a percutaneous nephrolithotomy. Postoperatively, he had a junctional rhythm. He had no hemodynamic instability and his cardiac rhythm issues resolved. Later, he developed left leg swelling. He was found to have a deep vein thrombosis of his left/thigh leg venous system. He was anticoagulated but then developed hematuria. The anticoagulation was stopped. An IVC filter was placed.  Subsequently was noted that he had a 3.6 second pause. It occurred at 5:12 this morning. The patient states that he was asleep. He remembers waking up at about 2:00 in the morning and then sleeping until about 6 in the morning. He denies any symptoms of lightheadedness or dizziness. When he is at home, he is active. He does a fair amount of walking without any dizziness, or lightheadedness. He denies chest discomfort or shortness of breath. He has never had any prior cardiac issues.  Dr Hassell Done note indicated no need for pacer.   Echo 1/15 with poor image quality EF ? 40% and mild to moderate AS  No clinical CHF      ROS: Denies fever, malais, weight loss, blurry vision, decreased visual acuity, cough, sputum, SOB, hemoptysis, pleuritic pain, palpitaitons, heartburn, abdominal pain, melena, lower extremity edema, claudication, or rash.  All other systems reviewed and negative  General: Affect appropriate Overweight white male  HEENT: normal Neck supple with no adenopathy JVP normal no bruits no thyromegaly Lungs clear with no wheezing and good diaphragmatic motion Heart:  S1/S2 preserved AS  murmur, no  rub, gallop or click PMI normal Abdomen: S/P surgery for diverticulitis and appy  Cannot palpate AAA no bruit.  No HSM or HJR Distal pulses intact with no bruits Plus 2 LLE edema  Right TKR  Neuro non-focal Skin warm and dry No muscular weakness   Current Outpatient Prescriptions  Medication Sig Dispense Refill  . acetaminophen (TYLENOL) 500 MG tablet Take 1,000 mg by mouth every 6 (six) hours as needed for mild pain or moderate pain.      Marland Kitchen doxazosin (CARDURA) 8 MG tablet Take 8 mg by mouth at bedtime.       . Naphazoline-Glycerin-Zinc Sulf (CLEAR EYES MAXIMUM ITCHY EYE OP) Place 2 drops into the left eye as needed (for reddness in eye).      . Rivaroxaban (XARELTO) 20 MG TABS tablet Take 1 tablet (20 mg total) by mouth daily with supper.  30 tablet  3  . simvastatin (ZOCOR) 80 MG tablet Take 80 mg by mouth every evening.      . tolterodine (DETROL LA) 4 MG 24 hr capsule Take 4 mg by mouth 2 (two) times daily.        No current facility-administered medications for this visit.    Allergies  Penicillins  Electrocardiogram:  4/17 SR PR 224  Nonspecific ST changes   Assessment and Plan

## 2013-08-03 NOTE — Assessment & Plan Note (Signed)
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

## 2013-08-03 NOTE — Assessment & Plan Note (Signed)
Well controlled.  Continue current medications and low sodium Dash type diet.    

## 2013-08-03 NOTE — Assessment & Plan Note (Signed)
Resolved  No high grade AV block Occuring early in am after renal stone surgery.  Certainly has some evidence of SSS but no indication for pacer. Avoid AV nodal blocking drugs  Telemetry post procedures

## 2013-08-03 NOTE — Assessment & Plan Note (Signed)
Clear to have procedure at [facility] with DR Sammuel Hines.  He has no history of CAD, no angina he is fairly active and this is a percutaneous revision be patient and daughter.  Seen consult note from Dr Irish Lack 2/15 and I agree no indication for pacer

## 2013-08-03 NOTE — Assessment & Plan Note (Signed)
AS not clinically manifest.  Would not be a TF TAVR candidate  Consider f/u echo in 6 months since initial diagnosis just made

## 2013-08-03 NOTE — Assessment & Plan Note (Signed)
Has filter in place  Hold xarelto 48 hours before any surgery per [facility] and Dr Sammuel Hines

## 2013-08-03 NOTE — Patient Instructions (Signed)
Your physician recommends that you schedule a follow-up appointment in: NEXT AVAILABLE WITH  DR  Marston Your physician recommends that you continue on your current medications as directed. Please refer to the Current Medication list given to you today.

## 2013-08-30 ENCOUNTER — Other Ambulatory Visit (HOSPITAL_BASED_OUTPATIENT_CLINIC_OR_DEPARTMENT_OTHER): Payer: Medicare Other

## 2013-08-31 ENCOUNTER — Other Ambulatory Visit (HOSPITAL_BASED_OUTPATIENT_CLINIC_OR_DEPARTMENT_OTHER): Payer: Medicare Other

## 2013-08-31 ENCOUNTER — Ambulatory Visit (HOSPITAL_BASED_OUTPATIENT_CLINIC_OR_DEPARTMENT_OTHER)
Admission: RE | Admit: 2013-08-31 | Discharge: 2013-08-31 | Disposition: A | Discharge status: Home / Self Care | Payer: Medicare Other | Source: Ambulatory Visit | Attending: Hematology & Oncology | Admitting: Hematology & Oncology

## 2013-08-31 ENCOUNTER — Other Ambulatory Visit (HOSPITAL_BASED_OUTPATIENT_CLINIC_OR_DEPARTMENT_OTHER): Payer: Medicare Other | Admitting: Lab

## 2013-08-31 ENCOUNTER — Other Ambulatory Visit: Payer: Medicare Other | Admitting: Lab

## 2013-08-31 ENCOUNTER — Ambulatory Visit (HOSPITAL_BASED_OUTPATIENT_CLINIC_OR_DEPARTMENT_OTHER): Payer: Medicare Other | Admitting: Hematology & Oncology

## 2013-08-31 ENCOUNTER — Ambulatory Visit: Payer: Medicare Other | Admitting: Hematology & Oncology

## 2013-08-31 DIAGNOSIS — I82409 Acute embolism and thrombosis of unspecified deep veins of unspecified lower extremity: Secondary | ICD-10-CM

## 2013-08-31 DIAGNOSIS — I10 Essential (primary) hypertension: Secondary | ICD-10-CM

## 2013-08-31 DIAGNOSIS — I82402 Acute embolism and thrombosis of unspecified deep veins of left lower extremity: Secondary | ICD-10-CM

## 2013-08-31 DIAGNOSIS — Z7901 Long term (current) use of anticoagulants: Secondary | ICD-10-CM | POA: Insufficient documentation

## 2013-08-31 LAB — CBC WITH DIFFERENTIAL (CANCER CENTER ONLY)
BASO#: 0 10*3/uL (ref 0.0–0.2)
BASO%: 0.7 % (ref 0.0–2.0)
EOS%: 3.3 % (ref 0.0–7.0)
Eosinophils Absolute: 0.2 10*3/uL (ref 0.0–0.5)
HCT: 42.2 % (ref 38.7–49.9)
HGB: 13.9 g/dL (ref 13.0–17.1)
LYMPH#: 1.3 10*3/uL (ref 0.9–3.3)
LYMPH%: 24.4 % (ref 14.0–48.0)
MCH: 28.3 pg (ref 28.0–33.4)
MCHC: 32.9 g/dL (ref 32.0–35.9)
MCV: 86 fL (ref 82–98)
MONO#: 0.5 10*3/uL (ref 0.1–0.9)
MONO%: 10 % (ref 0.0–13.0)
NEUT%: 61.6 % (ref 40.0–80.0)
NEUTROS ABS: 3.3 10*3/uL (ref 1.5–6.5)
Platelets: 148 10*3/uL (ref 145–400)
RBC: 4.92 10*6/uL (ref 4.20–5.70)
RDW: 15.3 % (ref 11.1–15.7)
WBC: 5.4 10*3/uL (ref 4.0–10.0)

## 2013-08-31 LAB — BASIC METABOLIC PANEL - CANCER CENTER ONLY
BUN, Bld: 20 mg/dL (ref 7–22)
CALCIUM: 9.2 mg/dL (ref 8.0–10.3)
CO2: 30 mEq/L (ref 18–33)
Chloride: 106 mEq/L (ref 98–108)
Creat: 1.1 mg/dl (ref 0.6–1.2)
Glucose, Bld: 94 mg/dL (ref 73–118)
Potassium: 4.3 mEq/L (ref 3.3–4.7)
SODIUM: 141 meq/L (ref 128–145)

## 2013-08-31 LAB — D-DIMER, QUANTITATIVE (NOT AT ARMC): D-Dimer, Quant: 1.66 ug/mL-FEU — ABNORMAL HIGH (ref 0.00–0.48)

## 2013-08-31 NOTE — Progress Notes (Signed)
Hematology and Oncology Follow Up Visit  [name] [id number] [birth year] 80 y.o. 08/31/2013   Principle Diagnosis:   Thromboembolic disease of the left leg  Current Therapy:    Xarelto 20 mg by mouth daily     Interim History:  [name] is back for followup. He was supposed to go to [facility] to have his abdominal aortic aneurysm taking care of. However, this is been postponed until July. He lives now in July 15.  We did go ahead and do a Doppler of his left leg. It does show a persistent thrombus in the thigh. However, the thrombus in the lower leg has resolved.  His left leg is not swollen. He's been very good about wearing a compression stocking.  He's had no bleeding. There's been no problems with bowels or bladder. He's had no cough. His been no chest wall pain.  He does have a filter placed in the IVC.  Medications: Current outpatient prescriptions:acetaminophen (TYLENOL) 500 MG tablet, Take 1,000 mg by mouth every 6 (six) hours as needed for mild pain or moderate pain., Disp: , Rfl: ;  doxazosin (CARDURA) 8 MG tablet, Take 8 mg by mouth at bedtime. , Disp: , Rfl: ;  Naphazoline-Glycerin-Zinc Sulf (CLEAR EYES MAXIMUM ITCHY EYE OP), Place 2 drops into the left eye as needed (for reddness in eye)., Disp: , Rfl:  Rivaroxaban (XARELTO) 20 MG TABS tablet, Take 1 tablet (20 mg total) by mouth daily with supper., Disp: 30 tablet, Rfl: 3;  simvastatin (ZOCOR) 80 MG tablet, Take 80 mg by mouth every evening., Disp: , Rfl: ;  tolterodine (DETROL LA) 4 MG 24 hr capsule, Take 4 mg by mouth 2 (two) times daily. , Disp: , Rfl:   Allergies:  Allergies  Allergen Reactions  . Penicillins Other (See Comments)    Other reaction(s): RASH HIVES     Past Medical History, Surgical history, Social history, and Family History were reviewed and updated.  Review of Systems: As above  Physical Exam:  vitals were not taken for this visit.  Elderly gentleman. There are no ocular or oral  lesions. He is no palpable cervical or supraclavicular lymph nodes. Lungs are clear. Cardiac exam regular rate and rhythm with no murmurs rubs or bruits. Abdomen is soft. Has good bowel sounds. He's mildly obese. There is no fluid wave. There is no palpable liver or spleen tip. Neck exam no tenderness over the spine ribs or hips. Extremities shows some chronic nonpitting edema of his left leg. Right leg has mild edema. No venous cord is noted in the legs. His good range of motion of his joints. Skin exam shows no rashes. Neurological exam is nonfocal.  Lab Results  Component Value Date   WBC 5.4 08/31/2013   HGB 13.9 08/31/2013   HCT 42.2 08/31/2013   MCV 86 08/31/2013   PLT 148 08/31/2013     Chemistry      Component Value Date/Time   NA 141 08/31/2013 1215   NA 141 07/03/2013 0350   K 4.3 08/31/2013 1215   K 4.0 07/03/2013 0350   CL 106 08/31/2013 1215   CL 106 07/03/2013 0350   CO2 30 08/31/2013 1215   CO2 26 07/03/2013 0350   BUN 20 08/31/2013 1215   BUN 19 07/03/2013 0350   CREATININE 1.1 08/31/2013 1215   CREATININE 1.10 07/03/2013 0350      Component Value Date/Time   CALCIUM 9.2 08/31/2013 1215   CALCIUM 9.1 07/03/2013 0350  ALKPHOS 67 06/29/2013 1715   AST 17 06/29/2013 1715   ALT 12 06/29/2013 1715   BILITOT 0.3 06/29/2013 1715         Impression and Plan: [name] is 80 year old gentleman with a thrombo-embolism in the left leg. He is on Xarelto. He presented back in February.  That they would have to keep him on Xarelto probably for a year.  I told him to stop the Xarelto on July 12. I wrote this down and gave it to him. This is for his procedure on July 15.  I will see him back in another couple months.  We probably will not do another Doppler for 4 months.   [name], MD 6/19/20152:02 PM

## 2013-09-03 ENCOUNTER — Other Ambulatory Visit: Payer: Medicare Other | Admitting: Lab

## 2013-09-03 ENCOUNTER — Ambulatory Visit: Payer: Medicare Other | Admitting: Hematology & Oncology

## 2013-09-07 ENCOUNTER — Ambulatory Visit (HOSPITAL_COMMUNITY)
Admission: RE | Admit: 2013-09-07 | Discharge: 2013-09-07 | Disposition: A | Discharge status: Home / Self Care | Payer: Medicare Other | Source: Ambulatory Visit | Attending: Urology | Admitting: Urology

## 2013-09-07 ENCOUNTER — Other Ambulatory Visit: Payer: Self-pay | Admitting: Urology

## 2013-09-07 ENCOUNTER — Ambulatory Visit (INDEPENDENT_AMBULATORY_CARE_PROVIDER_SITE_OTHER): Payer: Medicare Other | Admitting: Urology

## 2013-09-07 DIAGNOSIS — C61 Malignant neoplasm of prostate: Secondary | ICD-10-CM

## 2013-09-07 DIAGNOSIS — N200 Calculus of kidney: Secondary | ICD-10-CM

## 2013-09-07 DIAGNOSIS — M47817 Spondylosis without myelopathy or radiculopathy, lumbosacral region: Secondary | ICD-10-CM | POA: Insufficient documentation

## 2013-10-31 ENCOUNTER — Other Ambulatory Visit (HOSPITAL_BASED_OUTPATIENT_CLINIC_OR_DEPARTMENT_OTHER): Payer: Medicare Other | Admitting: Lab

## 2013-10-31 ENCOUNTER — Ambulatory Visit (HOSPITAL_BASED_OUTPATIENT_CLINIC_OR_DEPARTMENT_OTHER): Payer: Medicare Other | Admitting: Family

## 2013-10-31 ENCOUNTER — Encounter: Payer: Self-pay | Admitting: Hematology & Oncology

## 2013-10-31 VITALS — BP 96/64 | HR 90 | Temp 97.9°F | Resp 20 | Ht 71.0 in | Wt 256.0 lb

## 2013-10-31 DIAGNOSIS — I82402 Acute embolism and thrombosis of unspecified deep veins of left lower extremity: Secondary | ICD-10-CM

## 2013-10-31 DIAGNOSIS — I82409 Acute embolism and thrombosis of unspecified deep veins of unspecified lower extremity: Secondary | ICD-10-CM

## 2013-10-31 LAB — CBC WITH DIFFERENTIAL (CANCER CENTER ONLY)
BASO#: 0 10*3/uL (ref 0.0–0.2)
BASO%: 0.4 % (ref 0.0–2.0)
EOS%: 2.8 % (ref 0.0–7.0)
Eosinophils Absolute: 0.1 10*3/uL (ref 0.0–0.5)
HEMATOCRIT: 35.3 % — AB (ref 38.7–49.9)
HGB: 11.4 g/dL — ABNORMAL LOW (ref 13.0–17.1)
LYMPH#: 1 10*3/uL (ref 0.9–3.3)
LYMPH%: 18.7 % (ref 14.0–48.0)
MCH: 27.5 pg — ABNORMAL LOW (ref 28.0–33.4)
MCHC: 32.3 g/dL (ref 32.0–35.9)
MCV: 85 fL (ref 82–98)
MONO#: 0.5 10*3/uL (ref 0.1–0.9)
MONO%: 10 % (ref 0.0–13.0)
NEUT#: 3.5 10*3/uL (ref 1.5–6.5)
NEUT%: 68.1 % (ref 40.0–80.0)
Platelets: 136 10*3/uL — ABNORMAL LOW (ref 145–400)
RBC: 4.15 10*6/uL — ABNORMAL LOW (ref 4.20–5.70)
RDW: 15.7 % (ref 11.1–15.7)
WBC: 5.1 10*3/uL (ref 4.0–10.0)

## 2013-10-31 LAB — BASIC METABOLIC PANEL - CANCER CENTER ONLY
BUN, Bld: 17 mg/dL (ref 7–22)
CHLORIDE: 103 meq/L (ref 98–108)
CO2: 27 mEq/L (ref 18–33)
CREATININE: 1.1 mg/dL (ref 0.6–1.2)
Calcium: 8.9 mg/dL (ref 8.0–10.3)
Glucose, Bld: 93 mg/dL (ref 73–118)
Potassium: 3.9 mEq/L (ref 3.3–4.7)
Sodium: 140 mEq/L (ref 128–145)

## 2013-10-31 NOTE — Progress Notes (Signed)
[place]  [phone] [phone]) [phone]  ID: [name] OB: 05/04/1929 MR#: [id number]#:[id number] Patient Care Team: [name], MD as PCP - General (Internal Medicine)  DIAGNOSIS:  Thromboembolic disease of the left leg  INTERVAL HISTORY: [name] is back for follow-up. On July 15th he had his celiac artery stented at [facility]. He was sent home a few days later but returned to the ED 2 days later. He had sepsis and was treated with IV antibiotics. He ws sent home on August 7th with a PICC line and home health is administering his antibiotics. He has 3 weeks left of this. He is taking his Xarelto as prescribed. He has had no new pain or bleeding. He is tired but can tel that his energy is slowly improving. His Doppler study in June showed a persistent thrombus in the thigh. However, the thrombus in the lower leg has resolved. We will repeat another one in 2 months. He denies fever, chills, n/v, cough, rash, headache, dizziness, SOB, chest pain, palpitations, abdominal pain, constipation, diarrhea, blood in urine or stool. He has no swelling, tenderness, numbness or tingling in his extremities. He does have an IVC filter in place.  CURRENT TREATMENT: Xarelto 20 mg by mouth daily  REVIEW OF SYSTEMS: All other 10 point review of systems is negative.   PAST MEDICAL HISTORY: Past Medical History  Diagnosis Date  . Hypertension   . BPH (benign prostatic hyperplasia)   . Hyperlipidemia   . Cancer 2008-2009    prostate TREATED WITH RADIATION  . AAA (abdominal aortic aneurysm) 04/2012    STENTING OF AAA IN [place]  . Junctional cardiac arrhythmia     Occurred postoperatively after urologic surgery  . Severe sinus bradycardia     Occurred postoperatively after urologic surgery  . Aortic valve disorders   . Other primary cardiomyopathies    PAST SURGICAL HISTORY: Past Surgical History  Procedure Laterality Date  . Abdominal surgery     PART OF COLON REMOVED FOR DIVERTICULITIS  . Appendectomy    . Hernia repair    . Bladder surgery  2008    FOR BLADDER STONE  . Total knee arthroplasty  1990    left  . Abdominal aortic aneurysm repair  04/2012  . Total knee arthroplasty Right 07/26/2012    Procedure: RIGHT TOTAL KNEE ARTHROPLASTY;  Surgeon: Tobi Bastos, MD;  Location: [facility];  Service: Orthopedics;  Laterality: Right;  . Nephrolithotomy Left 04/26/2013    Procedure: LEFT PERCUTANEOUS NEPHROLITHOTOMY ;  Surgeon: [name], MD;  Location: [facility];  Service: Urology;  Laterality: Left;  . Nephrolithotomy Left 05/03/2013    Procedure: 2ND STAGE LEFT PERCUTANEOUS NEPHROLITHOTOMY ;  Surgeon: [name], MD;  Location: [facility];  Service: Urology;  Laterality: Left;   FAMILY HISTORY Family History  Problem Relation Age of Onset  . Lung cancer Father    GYNECOLOGIC HISTORY:  No LMP for male patient.   SOCIAL HISTORY:  History   Social History  . Marital Status: Married    Spouse Name: N/A    Number of Children: N/A  . Years of Education: N/A   Occupational History  . Not on file.   Social History Main Topics  . Smoking status: Former Smoker -- 2.00 packs/day for 37 years    Types: Cigarettes    Start date: 02/17/1949    Quit date: 03/15/1985  . Smokeless tobacco: Never Used     Comment: quit 25 years  ago  . Alcohol Use: No  . Drug Use: No  . Sexual Activity: No   Other Topics Concern  . Not on file   Social History Narrative  . No narrative on file   ADVANCED DIRECTIVES: <no information>  HEALTH MAINTENANCE: History  Substance Use Topics  . Smoking status: Former Smoker -- 2.00 packs/day for 37 years    Types: Cigarettes    Start date: 02/17/1949    Quit date: 03/15/1985  . Smokeless tobacco: Never Used     Comment: quit 25 years ago  . Alcohol Use: No   Colonoscopy: PAP: Bone density: Lipid panel:  Allergies  Allergen Reactions  . Penicillins Other (See Comments)    Other reaction(s):  RASH HIVES    Current Outpatient Prescriptions  Medication Sig Dispense Refill  . acetaminophen (TYLENOL) 500 MG tablet Take 1,000 mg by mouth every 6 (six) hours as needed for mild pain or moderate pain.      Marland Kitchen aspirin 81 MG chewable tablet Chew 81 mg by mouth.      . docusate sodium (STOOL SOFTENER) 100 MG capsule Take 200 mg by mouth.       . doxazosin (CARDURA) 8 MG tablet Take 8 mg by mouth at bedtime.       . meropenem (MERREM) 1 G injection Inject into the vein every 12 (twelve) hours.       . Naphazoline-Glycerin-Zinc Sulf (CLEAR EYES MAXIMUM ITCHY EYE OP) Place 2 drops into the left eye as needed (for reddness in eye).      . Rivaroxaban (XARELTO) 20 MG TABS tablet Take 1 tablet (20 mg total) by mouth daily with supper.  30 tablet  3  . simvastatin (ZOCOR) 80 MG tablet Take 80 mg by mouth every evening.      . tolterodine (DETROL LA) 4 MG 24 hr capsule Take 4 mg by mouth 2 (two) times daily.       Marland Kitchen oxyCODONE (OXY IR/ROXICODONE) 5 MG immediate release tablet Take 5 mg by mouth.       No current facility-administered medications for this visit.   OBJECTIVE: Filed Vitals:   10/31/13 0948  BP: 96/64  Pulse: 90  Temp: 97.9 F (36.6 C)  Resp: 20   Body mass index is 35.72 kg/(m^2). ECOG FS:0 - Asymptomatic Ocular: Sclerae unicteric, pupils equal, round and reactive to light Ear-nose-throat: Oropharynx clear, dentition fair Lymphatic: No cervical or supraclavicular adenopathy Lungs no rales or rhonchi, good excursion bilaterally Heart regular rate and rhythm, no murmur appreciated Abd soft, nontender, positive bowel sounds MSK no focal spinal tenderness, no joint edema Neuro: non-focal, well-oriented, appropriate affect  LAB RESULTS: CMP     Component Value Date/Time   NA 140 10/31/2013 0839   NA 141 07/03/2013 0350   K 3.9 10/31/2013 0839   K 4.0 07/03/2013 0350   CL 103 10/31/2013 0839   CL 106 07/03/2013 0350   CO2 27 10/31/2013 0839   CO2 26 07/03/2013 0350   GLUCOSE 93  10/31/2013 0839   GLUCOSE 90 07/03/2013 0350   BUN 17 10/31/2013 0839   BUN 19 07/03/2013 0350   CREATININE 1.1 10/31/2013 0839   CREATININE 1.10 07/03/2013 0350   CALCIUM 8.9 10/31/2013 0839   CALCIUM 9.1 07/03/2013 0350   PROT 6.2 06/29/2013 1715   ALBUMIN 3.1* 06/29/2013 1715   AST 17 06/29/2013 1715   ALT 12 06/29/2013 1715   ALKPHOS 67 06/29/2013 1715   BILITOT 0.3 06/29/2013 1715   GFRNONAA  61* 07/03/2013 0350   GFRAA 71* 07/03/2013 0350   No results found for this basename: SPEP, UPEP,  kappa and lambda light chains   Lab Results  Component Value Date   WBC 5.1 10/31/2013   NEUTROABS 3.5 10/31/2013   HGB 11.4* 10/31/2013   HCT 35.3* 10/31/2013   MCV 85 10/31/2013   PLT 136* 10/31/2013   No results found for this basename: LABCA2   No components found with this basename: [id number]   No results found for this basename: INR,  in the last 168 hours  STUDIES: No results found.  ASSESSMENT/PLAN: [name] is 80 year old gentleman with a thrombo-embolism in the left leg. He is on Xarelto. He presented back in February and has been on Xarelto since that time. We will keep him on Xarelto probably for a year. Overall, he is doing much better. We will schedule him for a doppler study of his legs in 2 months.  We will see him back for labs and follow-up in 2 months.  He is in agreement with this and knows to call here with any questions or concerns. We can certainly see him sooner if need be.   Eliezer Bottom, NP 10/31/2013 10:23 AM

## 2013-12-25 ENCOUNTER — Other Ambulatory Visit: Payer: Self-pay | Admitting: Nurse Practitioner

## 2013-12-25 DIAGNOSIS — I82409 Acute embolism and thrombosis of unspecified deep veins of unspecified lower extremity: Secondary | ICD-10-CM

## 2013-12-25 MED ORDER — RIVAROXABAN 20 MG PO TABS: 20.0000 mg | ORAL_TABLET | Freq: Every day | ORAL | Status: DC

## 2013-12-26 ENCOUNTER — Encounter: Payer: Self-pay | Admitting: Family

## 2013-12-26 ENCOUNTER — Other Ambulatory Visit (HOSPITAL_BASED_OUTPATIENT_CLINIC_OR_DEPARTMENT_OTHER): Payer: Medicare Other | Admitting: Lab

## 2013-12-26 ENCOUNTER — Ambulatory Visit (HOSPITAL_BASED_OUTPATIENT_CLINIC_OR_DEPARTMENT_OTHER): Payer: Medicare Other | Admitting: Family

## 2013-12-26 ENCOUNTER — Ambulatory Visit (HOSPITAL_BASED_OUTPATIENT_CLINIC_OR_DEPARTMENT_OTHER)
Admission: RE | Admit: 2013-12-26 | Discharge: 2013-12-26 | Disposition: A | Discharge status: Home / Self Care | Payer: Medicare Other | Source: Ambulatory Visit | Attending: Family | Admitting: Family

## 2013-12-26 VITALS — BP 113/76 | HR 82 | Temp 98.1°F | Resp 20

## 2013-12-26 DIAGNOSIS — I82402 Acute embolism and thrombosis of unspecified deep veins of left lower extremity: Secondary | ICD-10-CM | POA: Insufficient documentation

## 2013-12-26 DIAGNOSIS — I82409 Acute embolism and thrombosis of unspecified deep veins of unspecified lower extremity: Secondary | ICD-10-CM

## 2013-12-26 LAB — BASIC METABOLIC PANEL
BUN: 14 mg/dL (ref 6–23)
CHLORIDE: 103 meq/L (ref 96–112)
CO2: 27 meq/L (ref 19–32)
CREATININE: 1.21 mg/dL (ref 0.50–1.35)
Calcium: 9.6 mg/dL (ref 8.4–10.5)
GLUCOSE: 98 mg/dL (ref 70–99)
Potassium: 3.8 mEq/L (ref 3.5–5.3)
Sodium: 138 mEq/L (ref 135–145)

## 2013-12-26 LAB — CBC WITH DIFFERENTIAL (CANCER CENTER ONLY)
BASO#: 0 10*3/uL (ref 0.0–0.2)
BASO%: 0.3 % (ref 0.0–2.0)
EOS%: 1.7 % (ref 0.0–7.0)
Eosinophils Absolute: 0.1 10*3/uL (ref 0.0–0.5)
HCT: 34.5 % — ABNORMAL LOW (ref 38.7–49.9)
HEMOGLOBIN: 11 g/dL — AB (ref 13.0–17.1)
LYMPH#: 0.9 10*3/uL (ref 0.9–3.3)
LYMPH%: 15.2 % (ref 14.0–48.0)
MCH: 26.8 pg — ABNORMAL LOW (ref 28.0–33.4)
MCHC: 31.9 g/dL — AB (ref 32.0–35.9)
MCV: 84 fL (ref 82–98)
MONO#: 0.6 10*3/uL (ref 0.1–0.9)
MONO%: 10 % (ref 0.0–13.0)
NEUT#: 4.2 10*3/uL (ref 1.5–6.5)
NEUT%: 72.8 % (ref 40.0–80.0)
Platelets: 157 10*3/uL (ref 145–400)
RBC: 4.1 10*6/uL — ABNORMAL LOW (ref 4.20–5.70)
RDW: 17.5 % — ABNORMAL HIGH (ref 11.1–15.7)
WBC: 5.8 10*3/uL (ref 4.0–10.0)

## 2013-12-26 MED ORDER — RIVAROXABAN 20 MG PO TABS: 20.0000 mg | ORAL_TABLET | Freq: Every day | ORAL | Status: DC

## 2013-12-26 NOTE — Addendum Note (Signed)
Addended by: Rico Ala on: 12/26/2013 04:41 PM   Modules accepted: Medications

## 2013-12-26 NOTE — Progress Notes (Signed)
[place]  Telephone:(336) [phone] Fax:(336) [phone]  ID: [name] OB: 02-Dec-1929 MR#: [id number]#:[id number] Patient Care Team: [name], MD as PCP - General (Internal Medicine)  DIAGNOSIS:  Thromboembolic disease of the left leg  INTERVAL HISTORY: [name] is here today for a follow-up. On July 15th he had his celiac artery stented at [facility]. He was sent home a few days later but returned to the ED 2 days later. He had sepsis and was treated with IV antibiotics. He ws sent home on August 7th with a PICC line and home health is administering his antibiotics. He has 3 weeks left of this. He is taking his Xarelto as prescribed. He has had no new pain or bleeding. He has an appointment to go to [facility] and meet with his surgeon tomorrow. His Doppler study in June showed a persistent thrombus in the thigh. However, the thrombus in the lower leg has resolved. We will repeat another one in 2 months. He denies fever, chills, n/v, cough, rash, headache, dizziness, SOB, chest pain, palpitations, abdominal pain, constipation, diarrhea, blood in urine or stool. He has no swelling, tenderness, numbness or tingling in his extremities. He does have an IVC filter in place.His appetite is ok and he is staying hydrated.   CURRENT TREATMENT: Xarelto 20 mg by mouth daily  REVIEW OF SYSTEMS: All other 10 point review of systems is negative.   PAST MEDICAL HISTORY: Past Medical History  Diagnosis Date  . Hypertension   . BPH (benign prostatic hyperplasia)   . Hyperlipidemia   . Cancer 2008-2009    prostate TREATED WITH RADIATION  . AAA (abdominal aortic aneurysm) 04/2012    STENTING OF AAA IN [place]  . Junctional cardiac arrhythmia     Occurred postoperatively after urologic surgery  . Severe sinus bradycardia     Occurred postoperatively after urologic surgery  . Aortic valve disorders   . Other primary cardiomyopathies    PAST SURGICAL HISTORY: Past  Surgical History  Procedure Laterality Date  . Abdominal surgery      PART OF COLON REMOVED FOR DIVERTICULITIS  . Appendectomy    . Hernia repair    . Bladder surgery  2008    FOR BLADDER STONE  . Total knee arthroplasty  1990    left  . Abdominal aortic aneurysm repair  04/2012  . Total knee arthroplasty Right 07/26/2012    Procedure: RIGHT TOTAL KNEE ARTHROPLASTY;  Surgeon: Tobi Bastos, MD;  Location: [facility];  Service: Orthopedics;  Laterality: Right;  . Nephrolithotomy Left 04/26/2013    Procedure: LEFT PERCUTANEOUS NEPHROLITHOTOMY ;  Surgeon: [name], MD;  Location: [facility];  Service: Urology;  Laterality: Left;  . Nephrolithotomy Left 05/03/2013    Procedure: 2ND STAGE LEFT PERCUTANEOUS NEPHROLITHOTOMY ;  Surgeon: [name], MD;  Location: [facility];  Service: Urology;  Laterality: Left;   FAMILY HISTORY Family History  Problem Relation Age of Onset  . Lung cancer Father    GYNECOLOGIC HISTORY:  No LMP for male patient.   SOCIAL HISTORY:  History   Social History  . Marital Status: Married    Spouse Name: N/A    Number of Children: N/A  . Years of Education: N/A   Occupational History  . Not on file.   Social History Main Topics  . Smoking status: Former Smoker -- 2.00 packs/day for 37 years    Types: Cigarettes    Start date: 02/17/1949    Quit  date: 03/15/1985  . Smokeless tobacco: Never Used     Comment: quit 25 years ago  . Alcohol Use: No  . Drug Use: No  . Sexual Activity: No   Other Topics Concern  . Not on file   Social History Narrative  . No narrative on file   ADVANCED DIRECTIVES: <no information>  HEALTH MAINTENANCE: History  Substance Use Topics  . Smoking status: Former Smoker -- 2.00 packs/day for 37 years    Types: Cigarettes    Start date: 02/17/1949    Quit date: 03/15/1985  . Smokeless tobacco: Never Used     Comment: quit 25 years ago  . Alcohol Use: No   Colonoscopy: PAP: Bone density: Lipid panel:  Allergies   Allergen Reactions  . Penicillins Other (See Comments)    Other reaction(s): RASH HIVES    Current Outpatient Prescriptions  Medication Sig Dispense Refill  . acetaminophen (TYLENOL) 500 MG tablet Take 1,000 mg by mouth every 6 (six) hours as needed for mild pain or moderate pain.      Marland Kitchen aspirin 81 MG chewable tablet Chew 81 mg by mouth.      . docusate sodium (STOOL SOFTENER) 100 MG capsule Take 200 mg by mouth.       . doxazosin (CARDURA) 8 MG tablet Take 8 mg by mouth at bedtime.       . Naphazoline-Glycerin-Zinc Sulf (CLEAR EYES MAXIMUM ITCHY EYE OP) Place 2 drops into the left eye as needed (for reddness in eye).      Marland Kitchen oxyCODONE (OXY IR/ROXICODONE) 5 MG immediate release tablet Take 5 mg by mouth.      . rivaroxaban (XARELTO) 20 MG TABS tablet Take 1 tablet (20 mg total) by mouth daily with supper.  30 tablet  4  . simvastatin (ZOCOR) 80 MG tablet Take 80 mg by mouth every evening.      . tolterodine (DETROL LA) 4 MG 24 hr capsule Take 4 mg by mouth 2 (two) times daily.        No current facility-administered medications for this visit.   OBJECTIVE: There were no vitals filed for this visit. There is no weight on file to calculate BMI. ECOG FS:0 - Asymptomatic Ocular: Sclerae unicteric, pupils equal, round and reactive to light Ear-nose-throat: Oropharynx clear, dentition fair Lymphatic: No cervical or supraclavicular adenopathy Lungs no rales or rhonchi, good excursion bilaterally Heart regular rate and rhythm, no murmur appreciated Abd soft, nontender, positive bowel sounds MSK no focal spinal tenderness, no joint edema Neuro: non-focal, well-oriented, appropriate affect  LAB RESULTS: CMP     Component Value Date/Time   NA 140 10/31/2013 0839   NA 141 07/03/2013 0350   K 3.9 10/31/2013 0839   K 4.0 07/03/2013 0350   CL 103 10/31/2013 0839   CL 106 07/03/2013 0350   CO2 27 10/31/2013 0839   CO2 26 07/03/2013 0350   GLUCOSE 93 10/31/2013 0839   GLUCOSE 90 07/03/2013 0350    BUN 17 10/31/2013 0839   BUN 19 07/03/2013 0350   CREATININE 1.1 10/31/2013 0839   CREATININE 1.10 07/03/2013 0350   CALCIUM 8.9 10/31/2013 0839   CALCIUM 9.1 07/03/2013 0350   PROT 6.2 06/29/2013 1715   ALBUMIN 3.1* 06/29/2013 1715   AST 17 06/29/2013 1715   ALT 12 06/29/2013 1715   ALKPHOS 67 06/29/2013 1715   BILITOT 0.3 06/29/2013 1715   GFRNONAA 61* 07/03/2013 0350   GFRAA 71* 07/03/2013 0350   No results found for this  basename: SPEP,  UPEP,   kappa and lambda light chains   Lab Results  Component Value Date   WBC 5.8 12/26/2013   NEUTROABS 4.2 12/26/2013   HGB 11.0* 12/26/2013   HCT 34.5* 12/26/2013   MCV 84 12/26/2013   PLT 157 12/26/2013   No results found for this basename: LABCA2   No components found with this basename: [id number]   No results found for this basename: INR,  in the last 168 hours  STUDIES: No results found.  ASSESSMENT/PLAN: [name] is 80 year old gentleman with a thrombo-embolism in the left leg. He is on Xarelto. He presented back in February and has been on Xarelto since that time. We will keep him on Xarelto probably for a year. Overall, he seems to be doing well.  We will wait and see what today's doppler study shows.  We will see him back for labs and follow-up in 4 months. Hopefully, we will be able to stop the Xarelto at that time.  He is in agreement with this and knows to call here with any questions or concerns. We can certainly see him sooner if need be.   Eliezer Bottom, NP 12/26/2013 12:56 PM

## 2013-12-31 ENCOUNTER — Other Ambulatory Visit (HOSPITAL_BASED_OUTPATIENT_CLINIC_OR_DEPARTMENT_OTHER): Payer: Medicare Other

## 2014-01-02 ENCOUNTER — Emergency Department (HOSPITAL_COMMUNITY): Payer: Medicare Other

## 2014-01-02 ENCOUNTER — Encounter (HOSPITAL_COMMUNITY): Payer: Self-pay | Admitting: Emergency Medicine

## 2014-01-02 ENCOUNTER — Emergency Department (HOSPITAL_COMMUNITY)
Admission: EM | Admit: 2014-01-02 | Discharge: 2014-01-03 | Disposition: A | Discharge status: Other Acute Inpatient Hospital | Payer: Medicare Other | Attending: Emergency Medicine | Admitting: Emergency Medicine

## 2014-01-02 DIAGNOSIS — Z7901 Long term (current) use of anticoagulants: Secondary | ICD-10-CM | POA: Diagnosis not present

## 2014-01-02 DIAGNOSIS — I10 Essential (primary) hypertension: Secondary | ICD-10-CM | POA: Diagnosis not present

## 2014-01-02 DIAGNOSIS — Y832 Surgical operation with anastomosis, bypass or graft as the cause of abnormal reaction of the patient, or of later complication, without mention of misadventure at the time of the procedure: Secondary | ICD-10-CM | POA: Diagnosis not present

## 2014-01-02 DIAGNOSIS — E785 Hyperlipidemia, unspecified: Secondary | ICD-10-CM | POA: Insufficient documentation

## 2014-01-02 DIAGNOSIS — IMO0002 Reserved for concepts with insufficient information to code with codable children: Secondary | ICD-10-CM

## 2014-01-02 DIAGNOSIS — I714 Abdominal aortic aneurysm, without rupture, unspecified: Secondary | ICD-10-CM

## 2014-01-02 DIAGNOSIS — Z87448 Personal history of other diseases of urinary system: Secondary | ICD-10-CM | POA: Insufficient documentation

## 2014-01-02 DIAGNOSIS — R4182 Altered mental status, unspecified: Secondary | ICD-10-CM | POA: Insufficient documentation

## 2014-01-02 DIAGNOSIS — I959 Hypotension, unspecified: Secondary | ICD-10-CM

## 2014-01-02 DIAGNOSIS — M549 Dorsalgia, unspecified: Secondary | ICD-10-CM | POA: Diagnosis present

## 2014-01-02 DIAGNOSIS — Z9889 Other specified postprocedural states: Secondary | ICD-10-CM | POA: Insufficient documentation

## 2014-01-02 DIAGNOSIS — D688 Other specified coagulation defects: Secondary | ICD-10-CM | POA: Diagnosis not present

## 2014-01-02 DIAGNOSIS — Z7982 Long term (current) use of aspirin: Secondary | ICD-10-CM | POA: Insufficient documentation

## 2014-01-02 DIAGNOSIS — Z87891 Personal history of nicotine dependence: Secondary | ICD-10-CM | POA: Diagnosis not present

## 2014-01-02 DIAGNOSIS — Z9089 Acquired absence of other organs: Secondary | ICD-10-CM | POA: Diagnosis not present

## 2014-01-02 DIAGNOSIS — Z8546 Personal history of malignant neoplasm of prostate: Secondary | ICD-10-CM | POA: Diagnosis not present

## 2014-01-02 DIAGNOSIS — Z880 Allergy status to penicillin: Secondary | ICD-10-CM | POA: Insufficient documentation

## 2014-01-02 DIAGNOSIS — T82330A Leakage of aortic (bifurcation) graft (replacement), initial encounter: Secondary | ICD-10-CM | POA: Diagnosis not present

## 2014-01-02 DIAGNOSIS — R109 Unspecified abdominal pain: Secondary | ICD-10-CM | POA: Insufficient documentation

## 2014-01-02 DIAGNOSIS — E669 Obesity, unspecified: Secondary | ICD-10-CM | POA: Diagnosis not present

## 2014-01-02 LAB — I-STAT CHEM 8, ED
BUN: 21 mg/dL (ref 6–23)
CALCIUM ION: 1.35 mmol/L — AB (ref 1.13–1.30)
CHLORIDE: 103 meq/L (ref 96–112)
Creatinine, Ser: 1.4 mg/dL — ABNORMAL HIGH (ref 0.50–1.35)
GLUCOSE: 94 mg/dL (ref 70–99)
HEMATOCRIT: 34 % — AB (ref 39.0–52.0)
Hemoglobin: 11.6 g/dL — ABNORMAL LOW (ref 13.0–17.0)
Potassium: 3.8 mEq/L (ref 3.7–5.3)
Sodium: 137 mEq/L (ref 137–147)
TCO2: 24 mmol/L (ref 0–100)

## 2014-01-02 LAB — I-STAT TROPONIN, ED: Troponin i, poc: 0.01 ng/mL (ref 0.00–0.08)

## 2014-01-02 LAB — I-STAT CG4 LACTIC ACID, ED: LACTIC ACID, VENOUS: 1.04 mmol/L (ref 0.5–2.2)

## 2014-01-02 MED ORDER — SODIUM CHLORIDE 0.9 % IV BOLUS (SEPSIS)
1000.0000 mL | Freq: Once | INTRAVENOUS | Status: AC
  Administered 2014-01-03: 1000 mL via INTRAVENOUS

## 2014-01-02 MED ORDER — SODIUM CHLORIDE 0.9 % IV BOLUS (SEPSIS)
1000.0000 mL | Freq: Once | INTRAVENOUS | Status: AC
  Administered 2014-01-02: 1000 mL via INTRAVENOUS

## 2014-01-02 MED ORDER — FENTANYL CITRATE 0.05 MG/ML IJ SOLN
25.0000 ug | Freq: Once | INTRAMUSCULAR | Status: AC
  Administered 2014-01-02: 25 ug via INTRAVENOUS
  Filled 2014-01-02: qty 2

## 2014-01-02 NOTE — ED Notes (Signed)
Pt brought to ED by wife from [facility] for c/o back pain, pt is in rehab for cardiac stents at [facility] June 2015.

## 2014-01-02 NOTE — ED Notes (Signed)
Patient had lumbar drain and has been complaining of back pain x 2 months. Patient currently rates pain 9/10. Patient denies nausea, vomiting, diarrhea, lightheadedness, dizziness, blurred or double vision, numbness and tingling. Patient A&O x4. Patient has PICC in right forearm, placed at [facility].

## 2014-01-02 NOTE — ED Provider Notes (Signed)
CSN: [id number]     Arrival date & time 01/02/14  2212 History   First MD Initiated Contact with Patient 01/02/14 2237     Chief Complaint  Patient presents with  . Back Pain     (Consider location/radiation/quality/duration/timing/severity/associated sxs/prior Treatment) Patient is a 80 y.o. male presenting with back pain. The history is provided by the patient, medical records and a relative. No language interpreter was used.  Back Pain   [name] is a 80 y.o. male  with a hx of HTN, BPH, prostate cancer, AAA (repair at [facility]), cardiomyopathy presents to the Emergency Department complaining of gradual, persistent, progressively worsening back pain onset upon arriving home at [place] after d/c home from [place].  Pt c/o back pain but is otherwise unable to provide further Hx.  No associated symptoms and no known aggravating or alleviating factors.  Pt denies denies nausea, vomiting, diarrhea, lightheadedness, dizziness, blurred or double vision, numbness and tingling.    Hx provided by daughter: 04/2013 - AAA repair with initial stint 09/2013 - replaced stint with a larger one, d/c home then with sepsis and pseudomonas bacteremia likely from the stint 10/2013 begun on meropenum for 6 weeks via PICC 11/29/13 - PICC removed after course of abx and then pt began levaquin; that night fever and chills returned and readmission (no more levaquin) 12/07/13 - d/c home with PO keflex (to treat cellulitis of PICC site) 12/15/13 - fever, chills readmit 12/20/13  - new PICC placed for more abx 12/28/13 - improved back to full ADLs including driving, but on 82/95 pt with increased weakness and unable to stand; treated for dehydration 01/02/14 - pt d/c home and upon return to [place] (rehab) began to c/o intense back pain and found to be hypotensive.    All admissions (prior to tonight) at [facility].    Past Medical History  Diagnosis Date  . Hypertension   . BPH (benign prostatic hyperplasia)    . Hyperlipidemia   . Cancer 2008-2009    prostate TREATED WITH RADIATION  . AAA (abdominal aortic aneurysm) 04/2012    STENTING OF AAA IN [place]  . Junctional cardiac arrhythmia     Occurred postoperatively after urologic surgery  . Severe sinus bradycardia     Occurred postoperatively after urologic surgery  . Aortic valve disorders   . Other primary cardiomyopathies    Past Surgical History  Procedure Laterality Date  . Abdominal surgery      PART OF COLON REMOVED FOR DIVERTICULITIS  . Appendectomy    . Hernia repair    . Bladder surgery  2008    FOR BLADDER STONE  . Total knee arthroplasty  1990    left  . Abdominal aortic aneurysm repair  04/2012  . Total knee arthroplasty Right 07/26/2012    Procedure: RIGHT TOTAL KNEE ARTHROPLASTY;  Surgeon: Tobi Bastos, MD;  Location: [facility];  Service: Orthopedics;  Laterality: Right;  . Nephrolithotomy Left 04/26/2013    Procedure: LEFT PERCUTANEOUS NEPHROLITHOTOMY ;  Surgeon: [name], MD;  Location: [facility];  Service: Urology;  Laterality: Left;  . Nephrolithotomy Left 05/03/2013    Procedure: 2ND STAGE LEFT PERCUTANEOUS NEPHROLITHOTOMY ;  Surgeon: [name], MD;  Location: [facility];  Service: Urology;  Laterality: Left;   Family History  Problem Relation Age of Onset  . Lung cancer Father    History  Substance Use Topics  . Smoking status: Former Smoker -- 2.00 packs/day for 37 years  Types: Cigarettes    Start date: 02/17/1949    Quit date: 03/15/1985  . Smokeless tobacco: Never Used     Comment: quit 25 years ago  . Alcohol Use: No    Review of Systems  Unable to perform ROS: Mental status change  Musculoskeletal: Positive for back pain.      Allergies  Penicillins  Home Medications   Prior to Admission medications   Medication Sig Start Date End Date Taking? Authorizing Provider  acetaminophen (TYLENOL) 500 MG tablet Take 1,000 mg by mouth every 6 (six) hours as needed for mild pain or moderate pain.    Yes Historical Provider, MD  aspirin 81 MG chewable tablet Chew 81 mg by mouth. 10/11/13 10/11/14 Yes Historical Provider, MD  docusate sodium (STOOL SOFTENER) 100 MG capsule Take 200 mg by mouth daily as needed for moderate constipation.  10/02/13  Yes Historical Provider, MD  doxazosin (CARDURA) 8 MG tablet Take 8 mg by mouth at bedtime.    Yes Historical Provider, MD  gabapentin (NEURONTIN) 300 MG capsule Take 300 mg by mouth 3 (three) times daily.   Yes Historical Provider, MD  levofloxacin (LEVAQUIN) 750 MG/150ML SOLN Inject 750 mg into the vein daily. 01/03/14  Yes Historical Provider, MD  metoprolol succinate (TOPROL-XL) 25 MG 24 hr tablet Take 25 mg by mouth daily.   Yes Historical Provider, MD  Naphazoline-Glycerin-Zinc Sulf (CLEAR EYES MAXIMUM ITCHY EYE OP) Place 2 drops into the left eye as needed (for reddness in eye).   Yes Historical Provider, MD  oxyCODONE (OXY IR/ROXICODONE) 5 MG immediate release tablet Take 5 mg by mouth every 4 (four) hours as needed for moderate pain.  10/18/13  Yes Historical Provider, MD  rivaroxaban (XARELTO) 20 MG TABS tablet Take 1 tablet (20 mg total) by mouth daily with supper. 12/26/13  Yes Eliezer Bottom, NP  simvastatin (ZOCOR) 80 MG tablet Take 80 mg by mouth every evening.   Yes Historical Provider, MD  tolterodine (DETROL LA) 4 MG 24 hr capsule Take 4 mg by mouth 2 (two) times daily.    Yes Historical Provider, MD   BP 122/72  Pulse 74  Temp(Src) 97.7 F (36.5 C) (Oral)  Resp 17  Ht 6\' 2"  (1.88 m)  Wt 240 lb (108.863 kg)  BMI 30.80 kg/m2  SpO2 96% Physical Exam  Nursing note and vitals reviewed. Constitutional: He appears well-developed and well-nourished. No distress.  Awake, alert, nontoxic appearance  HENT:  Head: Normocephalic and atraumatic.  Mouth/Throat: Oropharynx is clear and moist. No oropharyngeal exudate.  Eyes: Conjunctivae are normal. No scleral icterus.  Neck: Normal range of motion. Neck supple.  Cardiovascular: Normal  rate, regular rhythm, normal heart sounds and intact distal pulses.   No murmur heard. Pulmonary/Chest: Effort normal and breath sounds normal. No respiratory distress. He has no wheezes.  Equal chest expansion Clear and equal breath sounds  Abdominal: Soft. Bowel sounds are normal. He exhibits no distension and no mass. There is no tenderness. There is no rebound and no guarding.  Obese Soft and nontender  Musculoskeletal: Normal range of motion. He exhibits no edema.  Neurological: He is alert. He exhibits normal muscle tone. Coordination normal.  Speech is clear and goal oriented Moves extremities without ataxia  Skin: Skin is warm and dry. He is not diaphoretic. There is pallor.  Psychiatric: He has a normal mood and affect.    ED Course  Procedures (including critical care time) Labs Review Labs Reviewed  CBC WITH DIFFERENTIAL -  Abnormal; Notable for the following:    RBC 4.01 (*)    Hemoglobin 10.4 (*)    HCT 33.3 (*)    MCH 25.9 (*)    RDW 17.4 (*)    Platelets 106 (*)    All other components within normal limits  COMPREHENSIVE METABOLIC PANEL - Abnormal; Notable for the following:    Sodium 136 (*)    Creatinine, Ser 1.48 (*)    Albumin 2.4 (*)    GFR calc non Af Amer 42 (*)    GFR calc Af Amer 49 (*)    All other components within normal limits  I-STAT CHEM 8, ED - Abnormal; Notable for the following:    Creatinine, Ser 1.40 (*)    Calcium, Ion 1.35 (*)    Hemoglobin 11.6 (*)    HCT 34.0 (*)    All other components within normal limits  CULTURE, BLOOD (ROUTINE X 2)  CULTURE, BLOOD (ROUTINE X 2)  URINE CULTURE  CULTURE, BLOOD (SINGLE)  URINALYSIS, ROUTINE W REFLEX MICROSCOPIC  I-STAT CG4 LACTIC ACID, ED  Randolm Idol, ED    Imaging Review Dg Chest Portable 1 View  01/02/2014   CLINICAL DATA:  Fever. Hypotension. Increased back pain. History of abdominal aortic aneurysm surgery at [facility]. PICC line placed in [facility].  EXAM: PORTABLE CHEST - 1  VIEW  COMPARISON:  06/29/2013  FINDINGS: Shallow inspiration with atelectasis in the lung bases. Heart size appears enlarged and pulmonary vascularity is increased slightly. No focal airspace disease or consolidation. No blunting of costophrenic angles. No pneumothorax. Calcified and tortuous aorta with stent graft in place in the descending region. Right PICC line with tip projected over the low SVC region.  IMPRESSION: Shallow inspiration with atelectasis in the lung bases. Cardiac enlargement with mild pulmonary vascular congestion.   Electronically Signed   By: Lucienne Capers M.D.   On: 01/02/2014 23:16   Ct Cta Abd/pel W/cm &/or W/o Cm  01/03/2014   CLINICAL DATA:  Of at abdominal pain. History of abdominal aortic aneurysm repaired onto 2015 with revision 12/13/2013. Increasing back pain and abdominal pain.  EXAM: CTA ABDOMEN AND PELVIS wITHOUT AND WITH CONTRAST  TECHNIQUE: Multidetector CT imaging of the abdomen and pelvis was performed using the standard protocol during bolus administration of intravenous contrast. Multiplanar reconstructed images and MIPs were obtained and reviewed to evaluate the vascular anatomy.  CONTRAST:  155mL OMNIPAQUE IOHEXOL 350 MG/ML SOLN  COMPARISON:  04/27/2013  FINDINGS: Atelectasis in the left lung base.  There is a stent grafted demonstrated in the visualized descending thoracic aorta, extending to the abdominal aorta and bilateral iliac arteries as well as the right internal and external iliac artery. Grafts are demonstrated at the celiac axis, superior mesenteric artery, and both single renal arteries. The native aneurysm sac at the level of the abdominal hiatus measures about 6.9 x 7.2 cm. This appears to be enlarged since the previous preoperative study. Contrast material is demonstrated within the native sac suggesting and obliques. There is contrast pooling suggesting an aneurysm or leak at the insertion of the celiac axis stent. There is infiltration or hematoma  around the celiac axis, new since previous study. It is uncertain whether this represents postoperative change or acute process such as containing leak or inflammation/infection. Native aneurysm sac at the distal aorta measures about 3.6 x 4.2 cm. Aneurysm sac around the right iliac bifurcation measures about 4.6 x 5.4 cm. The grafts are patent. Renal nephrograms are symmetrical.  The  liver, spleen, gallbladder, pancreas, adrenal glands, and retroperitoneal lymph nodes are unremarkable. Cyst in the upper pole left kidney is unchanged since prior study. No hydronephrosis in either kidney. Inferior vena caval filter. The stomach, small bowel, and colon are decompressed. No free air or free fluid in the abdomen. No significant retroperitoneal hematoma. Scarring in the anterior abdominal wall consistent with postoperative change.  Pelvis: Prostate gland is mildly enlarged. Bladder is decompressed. No inflammatory changes demonstrated in the pelvis. No free or loculated pelvic fluid collections. Degenerative changes throughout the spine and hips. No destructive bone lesions appreciated.  Review of the MIP images confirms the above findings.  IMPRESSION: Descending thoracic aortic, abdominal aorta, bilateral iliac, and right internal/external iliac stent grafts. Grafts to the celiac axis, superior mesenteric artery, and bilateral renal artery origins. Native aneurysm sac at the abdominal hiatus measures about 7 cm diameter. Contrast material is present within the native sac suggesting endoleaks. Aneurysm or leak at the insertion of the celiac axis stent with infiltrative changes suggesting small hematoma or infection.  These results were called by telephone at the time of interpretation on 01/03/2014 at 14:43 am to Dr. Roxanne Mins, who verbally acknowledged these results.   Electronically Signed   By: Lucienne Capers M.D.   On: 01/03/2014 00:48     EKG Interpretation   Date/Time:  Wednesday January 02 2014 22:31:55  EDT Ventricular Rate:  85 PR Interval:  223 QRS Duration: 109 QT Interval:  434 QTC Calculation: 516 R Axis:   15 Text Interpretation:  Sinus or ectopic atrial rhythm Prolonged PR interval  Abnormal R-wave progression, early transition Borderline repolarization  abnormality Prolonged QT interval Baseline wander in lead(s) V2 No  significant change since last tracing Confirmed by YAO  MD, DAVID ([id number])  on 01/02/2014 11:39:53 PM      CRITICAL CARE Performed by: Abigail Butts Total critical care time: 64min Critical care time was exclusive of separately billable procedures and treating other patients. Critical care was necessary to treat or prevent imminent or life-threatening deterioration. Critical care was time spent personally by me on the following activities: development of treatment plan with patient and/or surrogate as well as nursing, discussions with consultants, evaluation of patient's response to treatment, examination of patient, obtaining history from patient or surrogate, ordering and performing treatments and interventions, ordering and review of laboratory studies, ordering and review of radiographic studies, pulse oximetry and re-evaluation of patient's condition.   MDM   Final diagnoses:  Endoleak of aortic graft  Anticoagulated  Hypotensive episode  Abdominal aortic aneurysm   [name] presents with increasing and different back pain tonight after being discharged from [facility] this morning. Family states patient was hypotensive upon discharge this morning the patient was given fluid bolus and discharged back to [facility].    Records from [facility] reviewed at length.  Patient with significant history of AAA and subsequent stenting. Concern for possible aneurysm leak or rupture versus septic picture as patient is hypotensive 86P systolic.  Patient is afebrile without tachycardia however has been struggling with infection of the AAA stent for some  time.  Will obtain labs and a CT angio abd and pelvis.    1:00AM CT with endoleak at the insertion site of the celiac axis stent with questionable persistent infection versus hematoma. Will broaden antibiotic coverage 2 vancomycin. The patient is unable to have Zosyn as he is PCN allergic.  1:36 AM Pt discussed with Dr. Kellie Simmering of vascular surgery who reports that [facility] does  not perform celiac stints and the patient should be transferred to [facility].    BP 122/72  Pulse 74  Temp(Src) 97.7 F (36.5 C) (Oral)  Resp 17  Ht 6\' 2"  (1.88 m)  Wt 240 lb (108.863 kg)  BMI 30.80 kg/m2  SpO2 96%   2:07 AM Discussed with [facility] vascular surgery who requests that pt be transferred to the [facility] ER along with his imaging.    2:43 AM Pt discussed with Dr. Lucienne Minks in the ER at [facility] who will accept the transfer. Pt to be given Cefepime 2g enroute.  Pt to be transferred via [place].  The patient was discussed with and seen by Dr. Darl Householder who agrees with the treatment plan.     Jarrett Soho Agnes Brightbill, PA-C 01/03/14 [phone]

## 2014-01-03 DIAGNOSIS — M549 Dorsalgia, unspecified: Secondary | ICD-10-CM | POA: Diagnosis present

## 2014-01-03 DIAGNOSIS — Z87891 Personal history of nicotine dependence: Secondary | ICD-10-CM | POA: Diagnosis not present

## 2014-01-03 DIAGNOSIS — Z9889 Other specified postprocedural states: Secondary | ICD-10-CM | POA: Diagnosis not present

## 2014-01-03 DIAGNOSIS — Z7982 Long term (current) use of aspirin: Secondary | ICD-10-CM | POA: Diagnosis not present

## 2014-01-03 DIAGNOSIS — I714 Abdominal aortic aneurysm, without rupture: Secondary | ICD-10-CM | POA: Diagnosis not present

## 2014-01-03 DIAGNOSIS — R109 Unspecified abdominal pain: Secondary | ICD-10-CM | POA: Diagnosis not present

## 2014-01-03 DIAGNOSIS — D688 Other specified coagulation defects: Secondary | ICD-10-CM | POA: Diagnosis not present

## 2014-01-03 DIAGNOSIS — Z7901 Long term (current) use of anticoagulants: Secondary | ICD-10-CM | POA: Diagnosis not present

## 2014-01-03 DIAGNOSIS — I959 Hypotension, unspecified: Secondary | ICD-10-CM | POA: Diagnosis not present

## 2014-01-03 DIAGNOSIS — R4182 Altered mental status, unspecified: Secondary | ICD-10-CM | POA: Diagnosis not present

## 2014-01-03 DIAGNOSIS — Z87448 Personal history of other diseases of urinary system: Secondary | ICD-10-CM | POA: Diagnosis not present

## 2014-01-03 DIAGNOSIS — E785 Hyperlipidemia, unspecified: Secondary | ICD-10-CM | POA: Diagnosis not present

## 2014-01-03 DIAGNOSIS — E669 Obesity, unspecified: Secondary | ICD-10-CM | POA: Diagnosis not present

## 2014-01-03 DIAGNOSIS — Z880 Allergy status to penicillin: Secondary | ICD-10-CM | POA: Diagnosis not present

## 2014-01-03 DIAGNOSIS — Z8546 Personal history of malignant neoplasm of prostate: Secondary | ICD-10-CM | POA: Diagnosis not present

## 2014-01-03 DIAGNOSIS — T82330A Leakage of aortic (bifurcation) graft (replacement), initial encounter: Secondary | ICD-10-CM | POA: Diagnosis not present

## 2014-01-03 DIAGNOSIS — Z9089 Acquired absence of other organs: Secondary | ICD-10-CM | POA: Diagnosis not present

## 2014-01-03 DIAGNOSIS — I10 Essential (primary) hypertension: Secondary | ICD-10-CM | POA: Diagnosis not present

## 2014-01-03 DIAGNOSIS — Y832 Surgical operation with anastomosis, bypass or graft as the cause of abnormal reaction of the patient, or of later complication, without mention of misadventure at the time of the procedure: Secondary | ICD-10-CM | POA: Diagnosis not present

## 2014-01-03 LAB — COMPREHENSIVE METABOLIC PANEL
ALK PHOS: 56 U/L (ref 39–117)
ALT: 7 U/L (ref 0–53)
AST: 12 U/L (ref 0–37)
Albumin: 2.4 g/dL — ABNORMAL LOW (ref 3.5–5.2)
Anion gap: 13 (ref 5–15)
BILIRUBIN TOTAL: 0.4 mg/dL (ref 0.3–1.2)
BUN: 22 mg/dL (ref 6–23)
CHLORIDE: 98 meq/L (ref 96–112)
CO2: 25 meq/L (ref 19–32)
Calcium: 10.4 mg/dL (ref 8.4–10.5)
Creatinine, Ser: 1.48 mg/dL — ABNORMAL HIGH (ref 0.50–1.35)
GFR calc non Af Amer: 42 mL/min — ABNORMAL LOW (ref >90)
GFR, EST AFRICAN AMERICAN: 49 mL/min — AB (ref >90)
GLUCOSE: 92 mg/dL (ref 70–99)
POTASSIUM: 4 meq/L (ref 3.7–5.3)
SODIUM: 136 meq/L — AB (ref 137–147)
TOTAL PROTEIN: 6.7 g/dL (ref 6.0–8.3)

## 2014-01-03 LAB — CBC WITH DIFFERENTIAL/PLATELET
Basophils Absolute: 0 10*3/uL (ref 0.0–0.1)
Basophils Relative: 1 % (ref 0–1)
EOS ABS: 0.1 10*3/uL (ref 0.0–0.7)
EOS PCT: 2 % (ref 0–5)
HCT: 33.3 % — ABNORMAL LOW (ref 39.0–52.0)
Hemoglobin: 10.4 g/dL — ABNORMAL LOW (ref 13.0–17.0)
LYMPHS PCT: 22 % (ref 12–46)
Lymphs Abs: 1 10*3/uL (ref 0.7–4.0)
MCH: 25.9 pg — ABNORMAL LOW (ref 26.0–34.0)
MCHC: 31.2 g/dL (ref 30.0–36.0)
MCV: 83 fL (ref 78.0–100.0)
MONOS PCT: 12 % (ref 3–12)
Monocytes Absolute: 0.5 10*3/uL (ref 0.1–1.0)
NEUTROS ABS: 2.8 10*3/uL (ref 1.7–7.7)
NEUTROS PCT: 63 % (ref 43–77)
Platelets: 106 10*3/uL — ABNORMAL LOW (ref 150–400)
RBC: 4.01 MIL/uL — AB (ref 4.22–5.81)
RDW: 17.4 % — ABNORMAL HIGH (ref 11.5–15.5)
WBC: 4.4 10*3/uL (ref 4.0–10.5)

## 2014-01-03 MED ORDER — IOHEXOL 350 MG/ML SOLN
100.0000 mL | Freq: Once | INTRAVENOUS | Status: AC | PRN
  Administered 2014-01-03: 100 mL via INTRAVENOUS

## 2014-01-03 MED ORDER — CEFEPIME HCL 2 G IJ SOLR
2.0000 g | Freq: Once | INTRAMUSCULAR | Status: DC
  Filled 2014-01-03: qty 2

## 2014-01-03 MED ORDER — VANCOMYCIN HCL IN DEXTROSE 1-5 GM/200ML-% IV SOLN
1000.0000 mg | Freq: Once | INTRAVENOUS | Status: DC
  Filled 2014-01-03: qty 200

## 2014-01-03 MED ORDER — VANCOMYCIN HCL IN DEXTROSE 1-5 GM/200ML-% IV SOLN
1000.0000 mg | Freq: Once | INTRAVENOUS | Status: AC
  Administered 2014-01-03: 1000 mg via INTRAVENOUS
  Filled 2014-01-03: qty 200

## 2014-01-03 NOTE — ED Provider Notes (Signed)
Medical screening examination/treatment/procedure(s) were conducted as a shared visit with non-physician practitioner(s) and myself.  I personally evaluated the patient during the encounter.   EKG Interpretation   Date/Time:  Wednesday January 02 2014 22:31:55 EDT Ventricular Rate:  85 PR Interval:  223 QRS Duration: 109 QT Interval:  434 QTC Calculation: 516 R Axis:   15 Text Interpretation:  Sinus or ectopic atrial rhythm Prolonged PR interval  Abnormal R-wave progression, early transition Borderline repolarization  abnormality Prolonged QT interval Baseline wander in lead(s) V2 No  significant change since last tracing Confirmed by YAO  MD, DAVID ([id number])  on 01/02/2014 11:39:53 PM      [name] is a 80 y.o. male hx of AAA s/p 2 stents, recent pseudomonal bacteremia here with back pain. Just got discharged from [facility] for bacteremia and has R arm PICC line and is still on levaquin. He went to rehab and had acute onset of back pain and was noted to be hypotensive. On arrival, hypotensive in the 80s-90s. Afebrile. Heart, lungs unremarkable. Abdomen soft. Mild diffuse spinal tenderness. Given his history, I was concerned for endoleak around the stent vs septic shock. Sepsis protocol initiated and CT angio ordered. Lactate nl, WBC nl, cultures sent. CT showed endoleak and possible graft infection. Patient given vanc/cefepime. PA called Dr. Kellie Simmering from vascular, who doesn't do celiac stents. Patient transferred back to [facility].   CRITICAL CARE Performed by: Darl Householder, DAVID   Total critical care time: 30 min   Critical care time was exclusive of separately billable procedures and treating other patients.  Critical care was necessary to treat or prevent imminent or life-threatening deterioration.  Critical care was time spent personally by me on the following activities: development of treatment plan with patient and/or surrogate as well as nursing, discussions with consultants, evaluation of  patient's response to treatment, examination of patient, obtaining history from patient or surrogate, ordering and performing treatments and interventions, ordering and review of laboratory studies, ordering and review of radiographic studies, pulse oximetry and re-evaluation of patient's condition.    Wandra Arthurs, MD 01/03/14 [phone]

## 2014-01-09 LAB — CULTURE, BLOOD (SINGLE): CULTURE: NO GROWTH

## 2014-01-09 LAB — CULTURE, BLOOD (ROUTINE X 2)
CULTURE: NO GROWTH
Culture: NO GROWTH

## 2014-01-10 ENCOUNTER — Inpatient Hospital Stay
Admission: RE | Admit: 2014-01-10 | Discharge: 2014-01-11 | Disposition: A | Discharge status: Home Health | Payer: Medicare Other | Source: Ambulatory Visit | Attending: Internal Medicine | Admitting: Internal Medicine

## 2014-01-11 ENCOUNTER — Emergency Department (HOSPITAL_COMMUNITY)
Admission: EM | Admit: 2014-01-11 | Discharge: 2014-01-11 | Disposition: A | Discharge status: Other Acute Inpatient Hospital | Payer: Medicare Other | Attending: Emergency Medicine | Admitting: Emergency Medicine

## 2014-01-11 ENCOUNTER — Emergency Department (HOSPITAL_COMMUNITY): Payer: Medicare Other

## 2014-01-11 ENCOUNTER — Encounter: Payer: Self-pay | Admitting: Internal Medicine

## 2014-01-11 ENCOUNTER — Non-Acute Institutional Stay (SKILLED_NURSING_FACILITY): Payer: Medicare Other | Admitting: Internal Medicine

## 2014-01-11 ENCOUNTER — Encounter (HOSPITAL_COMMUNITY): Payer: Self-pay | Admitting: Emergency Medicine

## 2014-01-11 DIAGNOSIS — I959 Hypotension, unspecified: Secondary | ICD-10-CM | POA: Insufficient documentation

## 2014-01-11 DIAGNOSIS — I9589 Other hypotension: Secondary | ICD-10-CM

## 2014-01-11 DIAGNOSIS — R52 Pain, unspecified: Secondary | ICD-10-CM

## 2014-01-11 DIAGNOSIS — Z8546 Personal history of malignant neoplasm of prostate: Secondary | ICD-10-CM | POA: Insufficient documentation

## 2014-01-11 DIAGNOSIS — N390 Urinary tract infection, site not specified: Secondary | ICD-10-CM | POA: Insufficient documentation

## 2014-01-11 DIAGNOSIS — E785 Hyperlipidemia, unspecified: Secondary | ICD-10-CM | POA: Diagnosis not present

## 2014-01-11 DIAGNOSIS — Z79899 Other long term (current) drug therapy: Secondary | ICD-10-CM | POA: Diagnosis not present

## 2014-01-11 DIAGNOSIS — I10 Essential (primary) hypertension: Secondary | ICD-10-CM | POA: Insufficient documentation

## 2014-01-11 DIAGNOSIS — J189 Pneumonia, unspecified organism: Secondary | ICD-10-CM | POA: Insufficient documentation

## 2014-01-11 DIAGNOSIS — M549 Dorsalgia, unspecified: Secondary | ICD-10-CM | POA: Diagnosis present

## 2014-01-11 DIAGNOSIS — I714 Abdominal aortic aneurysm, without rupture, unspecified: Secondary | ICD-10-CM

## 2014-01-11 DIAGNOSIS — M5489 Other dorsalgia: Secondary | ICD-10-CM

## 2014-01-11 DIAGNOSIS — Z9889 Other specified postprocedural states: Secondary | ICD-10-CM | POA: Diagnosis not present

## 2014-01-11 DIAGNOSIS — I728 Aneurysm of other specified arteries: Secondary | ICD-10-CM | POA: Insufficient documentation

## 2014-01-11 LAB — COMPREHENSIVE METABOLIC PANEL
ALT: 7 U/L (ref 0–53)
AST: 17 U/L (ref 0–37)
Albumin: 2.2 g/dL — ABNORMAL LOW (ref 3.5–5.2)
Alkaline Phosphatase: 55 U/L (ref 39–117)
Anion gap: 13 (ref 5–15)
BILIRUBIN TOTAL: 0.6 mg/dL (ref 0.3–1.2)
BUN: 19 mg/dL (ref 6–23)
CALCIUM: 9 mg/dL (ref 8.4–10.5)
CHLORIDE: 100 meq/L (ref 96–112)
CO2: 23 meq/L (ref 19–32)
CREATININE: 1.16 mg/dL (ref 0.50–1.35)
GFR, EST AFRICAN AMERICAN: 66 mL/min — AB (ref >90)
GFR, EST NON AFRICAN AMERICAN: 57 mL/min — AB (ref >90)
GLUCOSE: 116 mg/dL — AB (ref 70–99)
Potassium: 3.7 mEq/L (ref 3.7–5.3)
Sodium: 136 mEq/L — ABNORMAL LOW (ref 137–147)
Total Protein: 6.1 g/dL (ref 6.0–8.3)

## 2014-01-11 LAB — CBC WITH DIFFERENTIAL/PLATELET
Basophils Absolute: 0 10*3/uL (ref 0.0–0.1)
Basophils Relative: 0 % (ref 0–1)
EOS PCT: 0 % (ref 0–5)
Eosinophils Absolute: 0 10*3/uL (ref 0.0–0.7)
HCT: 31.6 % — ABNORMAL LOW (ref 39.0–52.0)
Hemoglobin: 10.3 g/dL — ABNORMAL LOW (ref 13.0–17.0)
LYMPHS ABS: 0.4 10*3/uL — AB (ref 0.7–4.0)
Lymphocytes Relative: 6 % — ABNORMAL LOW (ref 12–46)
MCH: 25.6 pg — ABNORMAL LOW (ref 26.0–34.0)
MCHC: 32.6 g/dL (ref 30.0–36.0)
MCV: 78.6 fL (ref 78.0–100.0)
Monocytes Absolute: 0.5 10*3/uL (ref 0.1–1.0)
Monocytes Relative: 7 % (ref 3–12)
Neutro Abs: 6.2 10*3/uL (ref 1.7–7.7)
Neutrophils Relative %: 87 % — ABNORMAL HIGH (ref 43–77)
Platelets: 77 10*3/uL — ABNORMAL LOW (ref 150–400)
RBC: 4.02 MIL/uL — AB (ref 4.22–5.81)
RDW: 17.2 % — ABNORMAL HIGH (ref 11.5–15.5)
WBC: 7.1 10*3/uL (ref 4.0–10.5)

## 2014-01-11 LAB — URINALYSIS, ROUTINE W REFLEX MICROSCOPIC
Bilirubin Urine: NEGATIVE
GLUCOSE, UA: NEGATIVE mg/dL
KETONES UR: 15 mg/dL — AB
LEUKOCYTES UA: NEGATIVE
Nitrite: NEGATIVE
PROTEIN: 100 mg/dL — AB
Specific Gravity, Urine: 1.023 (ref 1.005–1.030)
UROBILINOGEN UA: 0.2 mg/dL (ref 0.0–1.0)
pH: 5 (ref 5.0–8.0)

## 2014-01-11 LAB — URINE MICROSCOPIC-ADD ON

## 2014-01-11 LAB — TYPE AND SCREEN
ABO/RH(D): O POS
ANTIBODY SCREEN: NEGATIVE

## 2014-01-11 LAB — TROPONIN I

## 2014-01-11 LAB — ABO/RH: ABO/RH(D): O POS

## 2014-01-11 MED ORDER — IOHEXOL 350 MG/ML SOLN
100.0000 mL | Freq: Once | INTRAVENOUS | Status: AC | PRN
  Administered 2014-01-11: 100 mL via INTRAVENOUS

## 2014-01-11 MED ORDER — PIPERACILLIN-TAZOBACTAM 3.375 G IVPB 30 MIN
3.3750 g | Freq: Once | INTRAVENOUS | Status: AC
  Administered 2014-01-11: 3.375 g via INTRAVENOUS
  Filled 2014-01-11: qty 50

## 2014-01-11 MED ORDER — SODIUM CHLORIDE 0.9 % IV SOLN
INTRAVENOUS | Status: DC
  Administered 2014-01-11: 14:00:00 via INTRAVENOUS
  Administered 2014-01-11: 125 mL/h via INTRAVENOUS

## 2014-01-11 MED ORDER — SODIUM CHLORIDE 0.9 % IV BOLUS (SEPSIS)
500.0000 mL | Freq: Once | INTRAVENOUS | Status: AC
  Administered 2014-01-11: 500 mL via INTRAVENOUS

## 2014-01-11 MED ORDER — DEXTROSE 5 % IV SOLN
500.0000 mg | Freq: Once | INTRAVENOUS | Status: AC
  Administered 2014-01-11: 500 mg via INTRAVENOUS
  Filled 2014-01-11: qty 500

## 2014-01-11 MED ORDER — VANCOMYCIN HCL 10 G IV SOLR
2000.0000 mg | Freq: Once | INTRAVENOUS | Status: AC
  Administered 2014-01-11: 2000 mg via INTRAVENOUS
  Filled 2014-01-11: qty 2000

## 2014-01-11 MED ORDER — PIPERACILLIN-TAZOBACTAM 3.375 G IVPB: 3.3750 g | Freq: Three times a day (TID) | INTRAVENOUS | Status: DC

## 2014-01-11 MED ORDER — FENTANYL CITRATE 0.05 MG/ML IJ SOLN
25.0000 ug | Freq: Once | INTRAMUSCULAR | Status: AC
  Administered 2014-01-11: 25 ug via INTRAVENOUS
  Filled 2014-01-11: qty 2

## 2014-01-11 MED ORDER — VANCOMYCIN HCL IN DEXTROSE 1-5 GM/200ML-% IV SOLN: 1000.0000 mg | Freq: Two times a day (BID) | INTRAVENOUS | Status: DC

## 2014-01-11 MED ORDER — ACETAMINOPHEN 325 MG PO TABS
650.0000 mg | ORAL_TABLET | Freq: Four times a day (QID) | ORAL | Status: DC | PRN
  Administered 2014-01-11: 650 mg via ORAL
  Filled 2014-01-11: qty 2

## 2014-01-11 MED ORDER — DEXTROSE 5 % IV SOLN
1.0000 g | Freq: Once | INTRAVENOUS | Status: AC
  Administered 2014-01-11: 1 g via INTRAVENOUS
  Filled 2014-01-11: qty 10

## 2014-01-11 NOTE — ED Notes (Signed)
Patient transported to CT 

## 2014-01-11 NOTE — ED Notes (Signed)
Had aaa repair at [facility], dc'd to [place] for rehab, sent here for back pain, n/v, ams, low grade fever, hypotension.  [place]

## 2014-01-11 NOTE — Progress Notes (Signed)
ANTIBIOTIC CONSULT NOTE - INITIAL  Pharmacy Consult for Vancomycin/Zosyn Indication: sepsis  Allergies  Allergen Reactions  . Atorvastatin Other (See Comments)    Causes constipation  . Penicillins Other (See Comments)    Other reaction(s): RASH HIVES     Patient Measurements: Height: 6' (182.9 cm) Weight: 240 lb (108.863 kg) IBW/kg (Calculated) : 77.6  Vital Signs: Temp: 98.6 F (37 C) (10/30 1548) Temp Source: Oral (10/30 1548) BP: 106/51 mmHg (10/30 1700) Pulse Rate: 43 (10/30 1700) Intake/Output from previous day:   Intake/Output from this shift:    Labs:  Recent Labs  01/11/14 1321  WBC 7.1  HGB 10.3*  PLT 77*  CREATININE 1.16   Estimated Creatinine Clearance: 62.6 ml/min (by C-G formula based on Cr of 1.16). No results found for this basename: VANCOTROUGH, VANCOPEAK, VANCORANDOM, [place], GENTPEAK, [place], [place], [place], TOBRARND, AMIKACINPEAK, AMIKACINTROU, AMIKACIN,  in the last 72 hours   Microbiology: Recent Results (from the past 720 hour(s))  CULTURE, BLOOD (ROUTINE X 2)     Status: None   Collection Time    01/02/14 11:28 PM      Result Value Ref Range Status   Specimen Description BLOOD LEFT ARM   Final   Special Requests BOTTLES DRAWN AEROBIC AND ANAEROBIC 5CC   Final   Culture  Setup Time     Final   Value: 01/03/2014 03:44     Performed at [place]     Final   Value: NO GROWTH 5 DAYS     Performed at [facility]   Report Status 01/09/2014 FINAL   Final  CULTURE, BLOOD (ROUTINE X 2)     Status: None   Collection Time    01/02/14 11:29 PM      Result Value Ref Range Status   Specimen Description BLOOD LEFT HAND   Final   Special Requests BOTTLES DRAWN AEROBIC AND ANAEROBIC 4CC   Final   Culture  Setup Time     Final   Value: 01/03/2014 03:44     Performed at [facility]   Culture     Final   Value: NO GROWTH 5 DAYS     Performed at [facility]   Report Status  01/09/2014 FINAL   Final  CULTURE, BLOOD (SINGLE)     Status: None   Collection Time    01/02/14 11:38 PM      Result Value Ref Range Status   Specimen Description BLOOD PIC LINE   Final   Special Requests BOTTLES DRAWN AEROBIC AND ANAEROBIC 5CC   Final   Culture  Setup Time     Final   Value: 01/03/2014 03:44     Performed at [facility]   Culture     Final   Value: NO GROWTH 5 DAYS     Performed at [facility]   Report Status 01/09/2014 FINAL   Final    Medical History: Past Medical History  Diagnosis Date  . Hypertension   . BPH (benign prostatic hyperplasia)   . Hyperlipidemia   . Cancer 2008-2009    prostate TREATED WITH RADIATION  . AAA (abdominal aortic aneurysm) 04/2012    STENTING OF AAA IN [place]  . Junctional cardiac arrhythmia     Occurred postoperatively after urologic surgery  . Severe sinus bradycardia     Occurred postoperatively after urologic surgery  . Aortic valve disorders   . Other primary cardiomyopathies    Medications:  Cefepime IV 2g q12h at nursing home.  Assessment: 80 y/o M had aaa repair at [facility] and sent to [place] [facility] for rehab.  Sent here for back pain, n/v, fever, and hypotension. Temp 101.4, pulse 43 - 138, RR 17, BP 106/51, no leukocytosis.  Empiric antibiotics for sepsis.    Blood cx--> Urine cx-->  Goal of Therapy:  Vancomycin trough 15-20 mcg/mL  Plan:  Vancomycin IV 2000mg  x 1, then Vancomycin IV 1000mg  q12h Zosyn IV 3.375g q8h (4hr infusion) F/u cultures, renal function, vancomycin trough  Carl Best 01/11/2014,5:28 PM

## 2014-01-11 NOTE — Progress Notes (Signed)
I agree with the above assessment.   Genavieve Mangiapane, PharmD.  Clinical Pharmacist Pager [phone]   

## 2014-01-11 NOTE — Progress Notes (Signed)
Patient ID: [name], male   DOB: 1929/03/23, 80 y.o.   MRN: [id number]   This is an acute visit.  Level of care skilled.  Facility [facility]  [facility] complaint-acute visit secondary to hypotension severe back pain with history of abdominal aortic aneurysm.  History of present illness.  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  This has required numerous hospitalizations mainly at [facility].  He was readmitted to the hospital in late July for continued back pain and ileus and was noted to have an endoleak above the celiac axis stent at the infrarenal AAA so he underwent a celiac stent extension and right renal artery stent angioplasty.  Marland Kitchen  He and subsequently he was readmitted for the infections and received IV antibiotics.  Most recently he presented in early October with weakness and poor by mouth intake-he again grew pseudomonas from his blood-and he was discharged on cephapirin-CT of the abdomen and pelvis showed inflammation around the aortic graft but no abscess-surgery was considered but deemed too high risk to proceed so he was placed on suppressive therapy.  There was recommendation for skilled nursing placement but he decided to go home-however he had poor by mouth intake and presented back to the emergency department on October 16 for failure to thrive with weakness Ash he also has significant weight loss-it was thought he would benefit from rehabilitation and this was discharged to skilled nursing-- Family continued to be weak and sustained a fall at home which required readmission he was transitioned back to Cefepime as his antibiotic.  Apparently this morning he had a couple short vomiting episodes-vital signs have been taken which is concerning for a  reading of 76/46-he is also complaining of fairly significant severe back pain which is more than his usual-also on evaluation he was tachycardic with a pulse of 120-he also appears to have a low-grade temperature 99.8 axillary.  He appears to be in somewhat moderate distress.  Family medical social history is been reviewed per discharge summary on 01/10/2014.  Medications have been reviewed per MAR.  Review of systems.  In general he is complaining of not feeling well he has a low-grade temperature.  Skin he does appear to be somewhat diaphoretic.  Head ears eyes nose mouth and throat does not complain of visual changes.  Respiratory does not complain of shortness of breath.  Cardiac is not complaining of chest pain.  GI is not specifically complaining of abdominal pain he's had some vomiting apparently of short duration.  Muscle skeletal is complaining of significant back pain.  Neurologic does not complain of dizziness or headache or syncopal-type feelings.  Psych-appears to be somewhat anxious.  Physical exam.  Temperature is 99.8 axillary pulse 120 respirations 24 blood pressure 76/46 O2 saturation is in the 90s on room air [id number]  In general this is a somewhat obese elderly male in somewhat moderate distress appears uncomfortable.  His skin is somewhat diaphoretic.  Eyes pupils appear reactive to light oropharynx is clear..  Chest somewhat poor respiratory effort shallow air entry but cannot appreciate any significant congestion.  He is having slight use of the sensory muscles.  Heart is tachycardic without murmur gallop or rub E as mild lower extremity edema.  Abdomens obese soft possibly some tenderness more in the left side  of the abdomen bowel sounds are positive. Muscle skeletal is able to move all extremities 4 is complaining of significant back pain I do not note any deformities of the back.  Neurologic is grossly intact to speech is clear and do not see  any lateralizing findings.  Psych he is alert and oriented and appropriate although does appear to be in some moderate distress and discomfort.  Labs.  01/11/2014.

## 2014-01-11 NOTE — ED Provider Notes (Signed)
CSN: [id number]     Arrival date & time 01/11/14  1305 History   First MD Initiated Contact with Patient 01/11/14 1307     Chief Complaint  Patient presents with  . Post-op Problem     (Consider location/radiation/quality/duration/timing/severity/associated sxs/prior Treatment) HPI  [name] is a 80 y.o. male who is here for evaluation of back pain and hypotension. His reportedly started today at his nursing care facility. He is unable to give history. He is DO NOT RESUSCITATE with known leaking abdominal aortic aneurysm status post grafting. He was discharged from the hospital at [facility], yesterday, after he was restarted on cefepime, for an infected abdominal aortic aneurysm graft.    Level V Caveat- poor historian, serious illness   Past Medical History  Diagnosis Date  . Hypertension   . BPH (benign prostatic hyperplasia)   . Hyperlipidemia   . Cancer 2008-2009    prostate TREATED WITH RADIATION  . AAA (abdominal aortic aneurysm) 04/2012    STENTING OF AAA IN [place]  . Junctional cardiac arrhythmia     Occurred postoperatively after urologic surgery  . Severe sinus bradycardia     Occurred postoperatively after urologic surgery  . Aortic valve disorders   . Other primary cardiomyopathies    Past Surgical History  Procedure Laterality Date  . Abdominal surgery      PART OF COLON REMOVED FOR DIVERTICULITIS  . Appendectomy    . Hernia repair    . Bladder surgery  2008    FOR BLADDER STONE  . Total knee arthroplasty  1990    left  . Abdominal aortic aneurysm repair  04/2012  . Total knee arthroplasty Right 07/26/2012    Procedure: RIGHT TOTAL KNEE ARTHROPLASTY;  Surgeon: Tobi Bastos, MD;  Location: [facility];  Service: Orthopedics;  Laterality: Right;  . Nephrolithotomy Left 04/26/2013    Procedure: LEFT PERCUTANEOUS NEPHROLITHOTOMY ;  Surgeon: [name], MD;  Location: [facility];  Service: Urology;  Laterality: Left;  . Nephrolithotomy Left 05/03/2013     Procedure: 2ND STAGE LEFT PERCUTANEOUS NEPHROLITHOTOMY ;  Surgeon: [name], MD;  Location: [facility];  Service: Urology;  Laterality: Left;   Family History  Problem Relation Age of Onset  . Lung cancer Father    History  Substance Use Topics  . Smoking status: Former Smoker -- 2.00 packs/day for 37 years    Types: Cigarettes    Start date: 02/17/1949    Quit date: 03/15/1985  . Smokeless tobacco: Never Used     Comment: quit 25 years ago  . Alcohol Use: No    Review of Systems  Unable to perform ROS     Allergies  Atorvastatin and Penicillins  Home Medications   Prior to Admission medications   Medication Sig Start Date End Date Taking? Authorizing Provider  acetaminophen (TYLENOL) 500 MG tablet Take 1,000 mg by mouth every 6 (six) hours as needed for mild pain or moderate pain.   Yes Historical Provider, MD  aspirin 81 MG chewable tablet Chew 81 mg by mouth daily.  10/11/13 10/11/14 Yes Historical Provider, MD  ceFEPIme 2 g in dextrose 5 % 50 mL Inject 2 g into the vein every 12 (twelve) hours. 01/10/14 02/26/14 Yes Historical Provider, MD  clopidogrel (PLAVIX) 75 MG tablet Take 75 mg by mouth daily. 01/10/14 01/10/15 Yes Historical Provider, MD  doxazosin (CARDURA) 8 MG tablet Take 8 mg by mouth at bedtime.    Yes Historical Provider, MD  metoprolol  succinate (TOPROL-XL) 25 MG 24 hr tablet Take 25 mg by mouth daily.   Yes Historical Provider, MD  mirtazapine (REMERON) 7.5 MG tablet Take 7.5 mg by mouth at bedtime. 01/01/14 01/31/14 Yes Historical Provider, MD  polyethylene glycol (MIRALAX / GLYCOLAX) packet Take 17 g by mouth daily.   Yes Historical Provider, MD  senna (SENOKOT) 8.6 MG TABS tablet Take 2 tablets by mouth at bedtime.   Yes Historical Provider, MD  simvastatin (ZOCOR) 20 MG tablet Take 20 mg by mouth daily.   Yes Historical Provider, MD   BP 110/54 mmHg  Pulse 62  Temp(Src) 97.7 F (36.5 C) (Oral)  Resp 18  Ht 6' (1.829 m)  Wt 240 lb (108.863 kg)  BMI  32.54 kg/m2  SpO2 99% Physical Exam  Nursing note and vitals reviewed. Constitutional: He appears well-developed.  Elderly, frail, he is complaining of back pain.  HENT:  Head: Normocephalic and atraumatic.  Right Ear: External ear normal.  Eyes: Conjunctivae and EOM are normal. Pupils are equal, round, and reactive to light.  Neck: Normal range of motion and phonation normal. Neck supple.  Cardiovascular: Normal rate, regular rhythm and normal heart sounds.   Pulmonary/Chest: Effort normal and breath sounds normal. He exhibits no bony tenderness.  Abdominal: Soft. There is tenderness (left upper, moderate. No discoloration of the abdominal wall.).  Musculoskeletal: Normal range of motion.  Neurological: He is alert. No cranial nerve deficit or sensory deficit. He exhibits normal muscle tone.  Skin: Skin is warm, dry and intact.  Psychiatric:  He is mildly agitated    ED Course  Procedures (including critical care time)  Initial plan is to evaluate for treatable problems, pending discovery of current status.  Medications  sodium chloride 0.9 % bolus 500 mL (0 mLs Intravenous Stopped 01/11/14 1353)  sodium chloride 0.9 % bolus 500 mL (0 mLs Intravenous Stopped 01/11/14 1419)  iohexol (OMNIPAQUE) 350 MG/ML injection 100 mL (100 mLs Intravenous Contrast Given 01/11/14 1524)  cefTRIAXone (ROCEPHIN) 1 g in dextrose 5 % 50 mL IVPB (0 g Intravenous Stopped 01/11/14 1803)  azithromycin (ZITHROMAX) 500 mg in dextrose 5 % 250 mL IVPB (0 mg Intravenous Stopped 01/11/14 1803)  vancomycin (VANCOCIN) 2,000 mg in sodium chloride 0.9 % 500 mL IVPB (2,000 mg Intravenous New Bag/Given 01/11/14 1940)  piperacillin-tazobactam (ZOSYN) IVPB 3.375 g (0 g Intravenous Stopped 01/11/14 1941)  fentaNYL (SUBLIMAZE) injection 25 mcg (25 mcg Intravenous Given 01/11/14 2103)    No data found.   CRITICAL CARE Performed by: Richarda Blade Total critical care time: 40 minutes Critical care time was  exclusive of separately billable procedures and treating other patients. Critical care was necessary to treat or prevent imminent or life-threatening deterioration. Critical care was time spent personally by me on the following activities: development of treatment plan with patient and/or surrogate as well as nursing, discussions with consultants, evaluation of patient's response to treatment, examination of patient, obtaining history from patient or surrogate, ordering and performing treatments and interventions, ordering and review of laboratory studies, ordering and review of radiographic studies, pulse oximetry and re-evaluation of patient's condition.   Labs Review Labs Reviewed  CBC WITH DIFFERENTIAL - Abnormal; Notable for the following:    RBC 4.02 (*)    Hemoglobin 10.3 (*)    HCT 31.6 (*)    MCH 25.6 (*)    RDW 17.2 (*)    Platelets 77 (*)    Neutrophils Relative % 87 (*)    Lymphocytes Relative 6 (*)  Lymphs Abs 0.4 (*)    All other components within normal limits  COMPREHENSIVE METABOLIC PANEL - Abnormal; Notable for the following:    Sodium 136 (*)    Glucose, Bld 116 (*)    Albumin 2.2 (*)    GFR calc non Af Amer 57 (*)    GFR calc Af Amer 66 (*)    All other components within normal limits  URINALYSIS, ROUTINE W REFLEX MICROSCOPIC - Abnormal; Notable for the following:    Color, Urine AMBER (*)    APPearance HAZY (*)    Hgb urine dipstick SMALL (*)    Ketones, ur 15 (*)    Protein, ur 100 (*)    All other components within normal limits  URINE MICROSCOPIC-ADD ON - Abnormal; Notable for the following:    Bacteria, UA MANY (*)    Casts HYALINE CASTS (*)    All other components within normal limits  URINE CULTURE  CULTURE, BLOOD (ROUTINE X 2)  CULTURE, BLOOD (ROUTINE X 2)  TROPONIN I  TYPE AND SCREEN  ABO/RH    Imaging Review Ct Cta Abd/pel W/cm &/or W/o Cm  01/11/2014   CLINICAL DATA:  Status post prior EVAR to treat abdominal aortic aneurysm with more  recent thoracoabdominal fenestrated extension performed at [facility]. The patient has a complete of worsening back pain, nausea, vomiting and is hypotensive.  EXAM: CTA ABDOMEN AND PELVIS WITHOUT CONTRAST  TECHNIQUE: Multidetector CT imaging of the abdomen and pelvis was performed using the standard protocol during bolus administration of intravenous contrast. Multiplanar reconstructed images and MIPs were obtained and reviewed to evaluate the vascular anatomy.  CONTRAST:  122mL OMNIPAQUE IOHEXOL 350 MG/ML SOLN  COMPARISON:  Reason CTA on 01/03/2014 as well as prior unenhanced CT on 04/27/2013 and CTA of the abdomen and pelvis on 05/19/2012.  FINDINGS: There is an enlarging pseudoaneurysm of the celiac axis at the distal end of a celiac stent. The pseudoaneurysm fills with contrast and now measures approximately 2.4 x 2.8 x 3.1 cm. On the previous study, maximal diameter of the pseudoaneurysm was approximately 2.1 cm. There is surrounding soft tissue thickening as well as adjacent probable dissection at the origin of the common hepatic artery which causes some narrowing of the origin of the common hepatic artery. Distal patency of hepatic arteries and the splenic artery are maintained without occlusion. The significant increase in size of the pseudoaneurysm since the prior study is worrisome for potential impending rupture. Delayed imaging also shows fairly diffuse enhancement of the surrounding rim of soft tissue around the pseudoaneurysm and there may be essentially a contained leak present. No air is seen in the adjacent soft tissues.  The thoracoabdominal fenestrated graft extension is otherwise normally patent with normal patency of bilateral renal artery stents. The superior mesenteric artery stent also appears normally patent.  The arterial phase of imaging no longer demonstrates endoleak in the thoracoabdominal aneurysm sac. The sac diameter also appears to be slightly diminished compared to the prior  study and now measures approximately 6.3 x 6.9 cm compared to approximately 6.9 x 7.2 cm at a comparable level on the 10/22 scan. No distal endoleak is identified with stable distal aortic sac diameter of 4 cm. Thrombosed right-sided common iliac aneurysm sac is stable. Graft extensions into the right external and internal iliac arteries show normal patency. Distal graft apposition on the left to the distal common iliac artery shows stable and normal patency. Native external iliac and common femoral arteries are normally patent.  No significant nonvascular findings involving solid organs or bowel. No masses or enlarged lymph nodes is seen. An IVC filter shows stable appearance. No hernias are identified. The bladder is decompressed. Bony structures show stable degenerative changes of the thoracolumbar spine.  Review of the MIP images confirms the above findings.  IMPRESSION: Enlarging pseudoaneurysm of the celiac axis at the distal end of a celiac stent which is part of a fenestrated thoracoabdominal endograft. The pseudoaneurysm now measures just over 3 cm in maximum diameter compared to 2.1 cm on the recent study on 01/03/2014. Surrounding soft tissue thickening is present which does show some delayed enhancement with findings suggesting contained rupture/impending rupture of the pseudoaneurysm. There appears to be a component of dissection at the origin of the common hepatic artery without occlusion. No further endoleak is identified in the aneurysm sac at the level of the diaphragmatic hiatus with smaller overall aneurysm sac diameter.  Critical Value/emergent results were called by telephone at the time of interpretation on 01/11/2014 at 4:17 pm to Dr. Daleen Bo , who verbally acknowledged these results.   Electronically Signed   By: Aletta Edouard M.D.   On: 01/11/2014 16:22     EKG Interpretation   Date/Time:  Friday January 11 2014 13:17:43 EDT Ventricular Rate:  104 PR Interval:  103 QRS  Duration: 106 QT Interval:  423 QTC Calculation: 556 R Axis:   38 Text Interpretation:  Sinus or ectopic atrial tachycardia Multiple  ventricular premature complexes Abnormal R-wave progression, early  transition Nonspecific T abnormalities, lateral leads Prolonged QT  interval Since last tracing QT has lengthened Confirmed by Eulis Foster  MD,  Vira Agar ([id number]) on 01/11/2014 3:15:14 PM      MDM   Final diagnoses:  Pain  Hypotension  HCAP (healthcare-associated pneumonia)  Urinary tract infection without hematuria, site unspecified  Aneurysm of celiac artery    Chronically ill pt with severe and worsening vascular disease and back pain with hypotension. No fever, possible UTI and HCAP. Critically ill patient who needs evaluation by vascular surgery who is familiar with his disease process.   Nursing Notes Reviewed/ Care Coordinated Applicable Imaging Reviewed Interpretation of Laboratory Data incorporated into ED treatment   Plan: Disposition as per Dr. Leonides Schanz, in conjunction with his vascular surgeon at [facility].    Richarda Blade, MD 01/13/14 [phone]

## 2014-01-11 NOTE — ED Provider Notes (Signed)
4:30 PM  Assumed care from Dr. Eulis Foster.  Pt is a 80 y.o. M with history of endovascular aortic repair in July 2015 by Dr. Sammuel Hines with history of recent pseudomonal bacteremia, recent admission to [facility] hospital for endoleak who presents to the emergency department from his nursing facility with hypotension and back pain. Patient has been found to have a fever, hypotension, healthcare associated pneumonia and a UTI. CT scan of his abdomen and pelvis shows an enlarging pseudoaneurysm of the celiac access at the distal end of a celiac stent which is part of the fenestrated endograft. There is surrounding soft tissue thickening suggesting contained rupture/impending rupture of the pseudoaneurysm. There appears to be a component of dissection at the origin of the common hepatic artery without occlusion. No further endoleak identified. This is worsened since a CT scan on 01/03/14. Plan is to follow-up with vascular surgery at [facility]. A call has been placed to [facility]. Family is aware of patient's poor prognosis. He has received IV fluids. Will give broad-spectrum antibiotics. Cultures pending.   5:00 PM  D/w [facility] vascular resident Alfred Levins.  She will contact fellow and call back.   6:00 PM  Spoke with Dr. Julianne Rice at [facility] with vascular surgery. Family discussing options and whether or not they would want surgical intervention if it was an option. They are also discussing the possibility of comfort care.    7:05 PM  Family decided to go to [facility] as this is what the patient states he would want. They would want surgery if it is indicated.  Spoke with Dr. Sammuel Hines and discuss CT results. He states these CT results are chronic and unchanged and the patient would not need vascular surgery. Discussed this with family and patient he would still want transfer to [place].  Dr. Sammuel Hines recommended ED to ED transfer. They will see the patient in consult. Family is aware that they will likely be admitted to medicine service. They're aware  of his poor prognosis. Offered them admission here at [facility].   7:50 PM  Spoke with Dr. Rosemary Holms with [facility] ED who agrees to except the patient in transfer. His blood pressure continues to be slightly low but is markedly improved since arrival with IV fluids.  We'll continue hydration.   8:45 PM  Last pressure 116/58.  [facility] at bedside for transport to [facility].   CRITICAL CARE Performed by: Nyra Jabs   Total critical care time: 60 minutes  Critical care time was exclusive of separately billable procedures and treating other patients.  Critical care was necessary to treat or prevent imminent or life-threatening deterioration.  Critical care was time spent personally by me on the following activities: development of treatment plan with patient and/or surrogate as well as nursing, discussions with consultants, evaluation of patient's response to treatment, examination of patient, obtaining history from patient or surrogate, ordering and performing treatments and interventions, ordering and review of laboratory studies, ordering and review of radiographic studies, pulse oximetry and re-evaluation of patient's condition.   [place], DO 01/11/14 [phone]

## 2014-01-12 LAB — URINE CULTURE
Colony Count: NO GROWTH
Culture: NO GROWTH

## 2014-01-13 DIAGNOSIS — M549 Dorsalgia, unspecified: Secondary | ICD-10-CM | POA: Insufficient documentation

## 2014-01-13 DIAGNOSIS — I959 Hypotension, unspecified: Secondary | ICD-10-CM | POA: Insufficient documentation

## 2014-01-17 LAB — CULTURE, BLOOD (ROUTINE X 2)
CULTURE: NO GROWTH
Culture: NO GROWTH

## 2014-01-18 ENCOUNTER — Inpatient Hospital Stay
Admission: RE | Admit: 2014-01-18 | Discharge: 2014-02-06 | Disposition: A | Discharge status: Nursing Home (Includes ICF) | Payer: Medicare Other | Source: Ambulatory Visit | Attending: Internal Medicine | Admitting: Internal Medicine

## 2014-01-18 DIAGNOSIS — R110 Nausea: Secondary | ICD-10-CM

## 2014-01-18 DIAGNOSIS — R1310 Dysphagia, unspecified: Principal | ICD-10-CM

## 2014-01-21 ENCOUNTER — Non-Acute Institutional Stay (SKILLED_NURSING_FACILITY): Payer: Medicare Other | Admitting: Internal Medicine

## 2014-01-21 ENCOUNTER — Other Ambulatory Visit: Payer: Self-pay | Admitting: *Deleted

## 2014-01-21 DIAGNOSIS — R634 Abnormal weight loss: Secondary | ICD-10-CM

## 2014-01-21 DIAGNOSIS — R112 Nausea with vomiting, unspecified: Secondary | ICD-10-CM

## 2014-01-21 DIAGNOSIS — A4152 Sepsis due to Pseudomonas: Secondary | ICD-10-CM

## 2014-01-21 DIAGNOSIS — T827XXD Infection and inflammatory reaction due to other cardiac and vascular devices, implants and grafts, subsequent encounter: Secondary | ICD-10-CM

## 2014-01-21 MED ORDER — LORAZEPAM 0.5 MG PO TABS: ORAL_TABLET | ORAL | Status: DC

## 2014-01-21 NOTE — Telephone Encounter (Signed)
[facility] 

## 2014-01-22 NOTE — Progress Notes (Addendum)
Patient ID: [name], male   DOB: [birth year], 80 y.o.   MRN: [id number]               HISTORY & PHYSICAL  DATE:  01/21/2014     FACILITY: [facility]    LEVEL OF CARE:   SNF   HISTORY OF PRESENT ILLNESS:  This is a medically complex, 80 year-old man who was in the building 7-10 days ago for less than a day before he was returned to hospital, eventually going to [facility] where he was admitted from 01/11/2014 through 01/18/2014.    He was found in our facility to have a temperature of 99.8.  He was also hypotensive with a blood pressure in the 70s (75/51), and he was returned to the hospital.    In the hospital this time, he was felt to be mildly dehydrated.  He was given fluids and empiric antibiotics with vanc and cefepime.    He was seen by Vascular Surgery and it was not felt that he required a surgical intervention.    It was also noted that he has significant failure to thrive with weakness, poor appetite, and weight loss.  This was felt secondary to his underlying infection.  He was not felt to be a surgical candidate.  He was encouraged to "eat more".  At this point, I am really uncertain what else has been done to look at this problem.    PAST MEDICAL HISTORY/PROBLEM LIST:  Other medical issues include:    Thoracoabdominal aortic aneurysm.  Status post FEVAR for TAA and AAA in July 2015, which has been complicated by a Pseudomonas graft infection and subsequent Proteus PICC line infection.    Iliac artery aneurysm.  Status post EVAR in 04/2012.    History of congestive heart failure.    History of chronic kidney disease.     History of lower extremity DVT.  On Xarelto.    History of difficulty walking and failure to thrive.    Surgery in July was complicated by postoperative right foot numbness and weakness which improved with lumbar drainage.    Admitted late in July for back pain and ileus, and was noted to have an endoleak above the celiac axis stent and at  the infrarenal abdominal aortic aneurysm so he underwent a celiac stent extension and right renal stent angioplasty.  During this admission, he had blood cultures positive and discharged on meropenem on 10/18/2013.  He completed these IV antibiotics on 11/22/2013 and was transitioned to Levaquin before being subsequently admitted to Cardiology for elevated troponin on 11/29/2013 when he presented with weakness.  At that point, I think he grew Proteus from his PICC line and he was discharged on Keflex since the bacteria was resistant to Levaquin.    Readmitted on 12/16/2013 with weakness and poor p.o. intake.  He again grew Pseudomonas from his blood during this admission and he was discharged on cefepime once he cleared his cultures.  CT scan of the abdomen and pelvis showed inflammation around his aortic graft, but no abscess.  Surgery was discussed, but he was deemed to be too high risk to proceed.     Again in the emergency department on 12/28/2013 for failure to thrive with weakness, decreased p.o. intake, decline in functional status.  He had lost 40 pounds since July.  X-ray of his spine at that time was unremarkable.    Left lower extremity DVT at some point.  Was on Xarelto, but this  has been discontinued.    History of atrial fib.    History of BPH.    CURRENT MEDICATIONS:  Medication list is reviewed.    Maalox 34 mL every 4 hours as needed.    Cefepime 2 g IV every 12 hours.    Tramadol q.6 p.r.n.    Tylenol 650 q.4 p.r.n.     Cardura 4 mg nightly.    ASA 81 q.d.    Plavix 75 q.d.    Colace 200 q.d.    Metoprolol 25 daily.    Remeron 7.5, I think being used as a combination appetite stimulant and antidepressant.    Naphazoline 0.1% ophthalmic four times daily.    MiraLAX 17 g p.r.n.    Senokot 2 g nightly.    Simvastatin 20 q.d.    Detrol LA 4 mg b.i.d.    SOCIAL HISTORY:   HOUSING:  The [place] with [place] wife in his own home.   FUNCTIONAL  STATUS:  Prior to the July hospitalization, he was functionally independent.  His family states that he has been in and out of hospital, not eating, progressive weakness, 40-pound weight loss since July.    FAMILY HISTORY:  None currently available.    REVIEW OF SYSTEMS:   HEENT:  Profound bilateral hearing loss.  He wears hearing aids.   CHEST/RESPIRATORY:  No shortness of breath.  No cough.   CARDIAC:   No chest pain.   GI:  No clear odynophagia.  He states he simply retches when his food gets to the back of his throat.  He is not having abdominal pain or diarrhea.   GU:  No clear dysuria.    PHYSICAL EXAMINATION:   VITAL SIGNS:   BLOOD PRESSURE:  124/75.   RESPIRATIONS:  20.   PULSE:  90.   TEMPERATURE:  98.5.   GENERAL APPEARANCE:  The patient does not look to be in any distress.   HEENT:   MOUTH/THROAT:   Mucous membranes are dry.  There are no obvious oropharyngeal lesions.   LYMPHATICS:  None palpable in the cervical, clavicular or axillary areas.   CHEST/RESPIRATORY:  Clear air entry bilaterally.   CARDIOVASCULAR:  CARDIAC:   Heart sounds are soft and irregular.  JVP is not elevated.   GASTROINTESTINAL:  LIVER/SPLEEN/KIDNEYS:  No liver, no spleen.  No tenderness.   ABDOMEN:  No masses.   GENITOURINARY:  BLADDER:   No suprapubic or costovertebral angle tenderness.   SWALLOWING:  I had him swallow some milk and a small scoop of eggs.  He did not do well with either.  The milk went down.  However, he coughed repetitively.  The eggs, I do not even think reached the back of his throat before he was retching and they went out all over the floor.   NEUROLOGICAL:    CRANIAL NERVES:  No overt cranial nerve abnormalities.   SENSATION/STRENGTH:  He has no pronator drift.  He does have barely antigravity strength at flexor, 3+/5 abductor.   DEEP TENDON REFLEXES:  He is diffusely hyporeflexic.  His Babinski response is equivocal.   BALANCE/GAIT:  He brought himself to a sitting position.   I did not attempt to ambulate him.   PSYCHIATRIC:   MENTAL STATUS:   I did not detect any major cognitive issues.  There may be a depression issue here, but I doubt it is a primary problem.    ASSESSMENT/PLAN:  Profound failure to thrive, dating back since at  least July of this year.  He is not eating, not drinking, has lost weight, and there has been considerable increase in weakness and his overall frailty.    ?Vomiting.  This would appear to be an oropharyngeal issue rather than an esophageal issue.  He was not even able to propel anything from the back of his throat.  I do not see any medications that could be contributing to this.  I think he is going to need a swallowing study, although my bet would be that he has already had one.  He may need endoscopy, or at least an upper GI series if he could tolerate that.   I have already talked to the family about the possibility of a feeding tube.  In fact, they actually brought this up. Interestingly he seems to have no problems with medications  Pseudomonas bacteremia from infected aneurysm grafts in his abdomen.  CT scans of the abdomen show inflammation, but no abscess.  I think this was felt to be the issue resulting in his overall decline, although I am not completely certain about that.   Presumably, follow-up blood cultures have been negative.  He is completing his antibiotics, although I do not have an exact stop date.    Depression.  His family thinks he is depressed.  He has no prior history of this.  Once again, I do not think this is playing a clear role.    Bacteremia due to Pseudomonas.  See discussion above.    Chronic systolic heart failure.  I do not think this is an issue right now.  In fact, I think he is probably slightly volume-contracted.    Atrial fibrillation and DVT.  He is off his Xarelto and on aspirin and Plavix.  I am going to empirically put him on a proton pump inhibitor although I do not think this is the issue,  either.    LABORATORY DATA:  Lab work done this morning shows an essentially normal basic metabolic panel.    His CBC shows a hemoglobin of 9.3 and a platelet count of 116.  These were 8.9 and 84 on 01/18/2014.  I am not completely certain about the issue with regards to the low platelets and/or his low hemoglobin.   I do not see these on the records that are available.

## 2014-01-23 ENCOUNTER — Inpatient Hospital Stay (HOSPITAL_COMMUNITY)
Admit: 2014-01-23 | Discharge: 2014-01-23 | Disposition: A | Discharge status: Home / Self Care | Payer: Medicare Other | Attending: Internal Medicine | Admitting: Internal Medicine

## 2014-01-23 ENCOUNTER — Encounter (HOSPITAL_COMMUNITY): Payer: Self-pay | Admitting: Speech Pathology

## 2014-01-23 ENCOUNTER — Non-Acute Institutional Stay (SKILLED_NURSING_FACILITY): Payer: Medicare Other | Admitting: Internal Medicine

## 2014-01-23 ENCOUNTER — Ambulatory Visit (HOSPITAL_COMMUNITY)
Admit: 2014-01-23 | Discharge: 2014-01-23 | Disposition: A | Discharge status: Home / Self Care | Payer: Medicare Other | Source: Skilled Nursing Facility | Attending: Internal Medicine | Admitting: Internal Medicine

## 2014-01-23 DIAGNOSIS — Z5189 Encounter for other specified aftercare: Secondary | ICD-10-CM | POA: Diagnosis not present

## 2014-01-23 DIAGNOSIS — R1310 Dysphagia, unspecified: Secondary | ICD-10-CM | POA: Insufficient documentation

## 2014-01-23 DIAGNOSIS — T827XXD Infection and inflammatory reaction due to other cardiac and vascular devices, implants and grafts, subsequent encounter: Secondary | ICD-10-CM

## 2014-01-23 DIAGNOSIS — R1314 Dysphagia, pharyngoesophageal phase: Secondary | ICD-10-CM

## 2014-01-23 DIAGNOSIS — A4152 Sepsis due to Pseudomonas: Secondary | ICD-10-CM

## 2014-01-23 DIAGNOSIS — R112 Nausea with vomiting, unspecified: Secondary | ICD-10-CM

## 2014-01-23 NOTE — Procedures (Signed)
Modified Barium Swallow  Patient Details  Name: [name] MRN: [id number] Date of Birth: [birth year]  Encounter Date: 01/23/2014      End of Session - 01/23/14 1425    Visit Number 1   Number of Visits 1   Authorization Type [employer]   SLP Start Time 1310   SLP Time Calculation (min) 1343   SLP Time Calculation (min) 33 min   Activity Tolerance Patient tolerated treatment well      Past Medical History  Diagnosis Date  . Hypertension   . BPH (benign prostatic hyperplasia)   . Hyperlipidemia   . Cancer 2008-2009    prostate TREATED WITH RADIATION  . AAA (abdominal aortic aneurysm) 04/2012    STENTING OF AAA IN [place]  . Junctional cardiac arrhythmia     Occurred postoperatively after urologic surgery  . Severe sinus bradycardia     Occurred postoperatively after urologic surgery  . Aortic valve disorders   . Other primary cardiomyopathies     Past Surgical History  Procedure Laterality Date  . Abdominal surgery      PART OF COLON REMOVED FOR DIVERTICULITIS  . Appendectomy    . Hernia repair    . Bladder surgery  2008    FOR BLADDER STONE  . Total knee arthroplasty  1990    left  . Abdominal aortic aneurysm repair  04/2012  . Total knee arthroplasty Right 07/26/2012    Procedure: RIGHT TOTAL KNEE ARTHROPLASTY;  Surgeon: Tobi Bastos, MD;  Location: [facility];  Service: Orthopedics;  Laterality: Right;  . Nephrolithotomy Left 04/26/2013    Procedure: LEFT PERCUTANEOUS NEPHROLITHOTOMY ;  Surgeon: [name], MD;  Location: [facility];  Service: Urology;  Laterality: Left;  . Nephrolithotomy Left 05/03/2013    Procedure: 2ND STAGE LEFT PERCUTANEOUS NEPHROLITHOTOMY ;  Surgeon: [name], MD;  Location: [facility];  Service: Urology;  Laterality: Left;    There were no vitals taken for this visit.  Visit Diagnosis: Dysphagia                 Plan - 01/23/14 1425    Clinical Impression Statement [name] presents with mild  oropharyngeal phase dysphagia characterized by weak lingual manipulation, slow anterior posterior transit with solids, mild delay in swallow initiation, and premature spillage of thin liquids to the pyriforms without evidence of laryngeal penetration or aspiration. With puree presentations, [name] immediately starts gagging (applesauce only along tongue beneath hard palate) and expectorated bolus on 2 of 2 trials. He tolerated trials of mech soft (cracker with puree) and regular (graham cracker) with no gagging. Mech soft became transiently delayed along soft palate/tip of uvulae (no gag elicited), but he was able to swallow to clear with only minimal lingual and vallecular residue. Pt had mild liquid residue along tongue and palate of dentures. [name] swallowed the barium tablet with thin liquids without incident (transient delay in distal esophagus which cleared after just a few minutes). Pt unable to explain whether he is gagging because of distaste, texture, or stimulation of gag reflux, but simply states, "I just can't swallow that stuff anymore." SLP could find no objective correlates to explain pt's symptoms. Would recommend giving pt a regular texture diet (modify meats for ease of mastication due to need for new dentures) that requires chewing and has texture to it (versus puree) to see if this is tolerated. Recommendations discussed with treating SLP.   Potential to [place]  G-Codes - 01/23/14 1441    Functional Assessment Tool Used MBSS   Functional Limitations Swallowing   Swallow Current Status (H6314) At least 1 percent but less than 20 percent impaired, limited or restricted   Swallow Goal Status (H7026) At least 1 percent but less than 20 percent impaired, limited or restricted   Swallow Discharge Status [phone]) At least 1 percent but less than 20 percent impaired, limited or restricted      Problem List Patient Active Problem List   Diagnosis Date Noted   . Back pain 01/13/2014  . Hypotension 01/13/2014  . DVT of leg (deep venous thrombosis) 06/29/2013  . Supratherapeutic INR 05/17/2013  . Urinary urgency 05/15/2013  . Anticoagulation management encounter 05/15/2013  . Aortic valve disorders   . Other primary cardiomyopathies   . Gross hematuria 05/06/2013  . DVT, lower extremity 05/03/2013  . Thrombocytopenia, unspecified 04/30/2013  . Infection of urinary tract 04/27/2013  . Junctional cardiac arrhythmia   . Severe sinus bradycardia   . Renal stone 04/25/2013  . Postoperative anemia due to acute blood loss 07/28/2012  . Osteoarthritis of right knee 07/26/2012  . Weakness of right leg 05/19/2012  . HTN (hypertension) 05/19/2012  . Abdominal aortic aneurysm 03/28/2012          General - 01/23/14 1411    General Information   Date of Onset 01/20/14   HPI [name] is a medically complex, 80 year-old man who was referred by Dr. Dellia Nims for MBSS due to FTT and "retching" when eating. He is currently residing at [facility] for rehab. He was at [facility] about 2 weeks ago for less than a day before he was returned to hospital, eventually going to [facility] where he was admitted from 01/11/2014 through 01/18/2014. It was also noted that he has significant failure to thrive with weakness, poor appetite, and weight loss. He was admitted in late July for back pain and ileus, and was noted to have an endoleak above the celiac axis stent and at the infrarenal abdominal aortic aneurysm so he underwent a celiac stent extension and right renal stent angioplasty which was complicated by a Pseudomonas graft infection and subsequent Proteus PICC line infection.    Type of Study Modified Barium Swallowing Study   Reason for Referral Objectively evaluate swallowing function   Previous Swallow Assessment None on record   Diet Prior to this Study Dysphagia 3 (soft);Thin liquids   Temperature Spikes Noted No   Respiratory Status Room air   History of Recent  Intubation No   Behavior/Cognition Alert;Cooperative   Oral Cavity - Dentition Dentures, top;Dentures, bottom   Oral Motor / Sensory Function Within functional limits  mild lingual weakness   Self-Feeding Abilities Able to feed self   Patient Positioning Upright in chair   Baseline Vocal Quality Clear;Breathy   Volitional Cough Strong   Volitional Swallow Able to elicit   Anatomy Within functional limits   Pharyngeal Secretions Not observed secondary MBS            Oral Preparation/Oral Phase - 01/23/14 1413    Oral Preparation/Oral Phase   Oral Phase Impaired   Oral - Thin   Oral - Thin Cup Within functional limits   Oral - Thin Straw Within functional limits   Oral - Solids   Oral - Puree Other (Comment)  Pt began gagging when apple sauce reached tongue blade and expectorated   Oral - Mechanical Soft Weak lingual manipulation;Incomplete tongue to palate contact;Reduced posterior propulsion;Lingual/palatal residue;Piecemeal swallowing;Delayed  oral transit  brief stasis of bolus along soft palate before swallow, no gag   Oral - Regular Within functional limits   Oral - Pill Within functional limits   Electrical stimulation - Oral Phase   Was Electrical Stimulation Used No          Pharyngeal Phase - 01/23/14 1418    Pharyngeal Phase   Pharyngeal Phase Impaired   Pharyngeal - Thin   Pharyngeal - Thin Cup Premature spillage to valleculae;Premature spillage to pyriform sinuses   Pharyngeal - Thin Straw Premature spillage to valleculae;Premature spillage to pyriform sinuses   Pharyngeal - Solids   Pharyngeal - Puree Not tested  pt expectorated puree presentations before swallow   Pharyngeal - Mechanical Soft Delayed swallow initiation;Premature spillage to valleculae;Reduced anterior laryngeal mobility;Reduced laryngeal elevation;Reduced tongue base retraction;Pharyngeal residue - valleculae   Pharyngeal - Regular Premature spillage to valleculae;Reduced anterior  laryngeal mobility;Reduced laryngeal elevation;Reduced tongue base retraction;Pharyngeal residue - valleculae   Pharyngeal - Pill Within functional limits   Electrical Stimulation - Pharyngeal Phase   Was Electrical Stimulation Used No          Cricopharyngeal Phase - 01/23/14 1421    Cervical Esophageal Phase   Cervical Esophageal Phase [facility]          Recommendations/Treatment - 01/23/14 1421    Swallow Evaluation Recommendations   Diet Recommendations Regular;Thin liquid  chopped meats due to poor dentition   Liquid Administration via Cup;Straw   Medication Administration Whole meds with liquid   Supervision Patient able to self feed   Postural Changes and/or Swallow Maneuvers Out of bed for meals;Seated upright 90 degrees;Upright 30-60 min after meal   Oral Care Recommendations Oral care BID   Other Recommendations Clarify dietary restrictions  pt did better with foods that require mastication   Follow up Recommendations Skilled Nursing facility          Prognosis - 01/23/14 1423    Prognosis   Prognosis for Safe Diet [place]   Barriers to Reach Goals Motivation   Barriers/Prognosis Comment unknown reason for gagging with puree   Individuals Consulted   Consulted and Agree with Results and Recommendations Patient;Other (Comment)  treating SLP, Merilynn [place]   Report Sent to  Primary SLP;Facility (Comment)     Thank you,  Genene Churn, [place]                                          [place] 01/23/2014, 2:43 PM

## 2014-01-23 NOTE — Therapy (Signed)
Modified Barium Swallow  Patient Details  Name: [name] MRN: [id number] Date of Birth: [birth year]  Encounter Date: 01/23/2014      End of Session - 01/23/14 1425    Visit Number 1   Number of Visits 1   Authorization Type [employer]   SLP Start Time 1310   SLP Time Calculation (min) 1343   SLP Time Calculation (min) 33 min   Activity Tolerance Patient tolerated treatment well      Past Medical History  Diagnosis Date  . Hypertension   . BPH (benign prostatic hyperplasia)   . Hyperlipidemia   . Cancer 2008-2009    prostate TREATED WITH RADIATION  . AAA (abdominal aortic aneurysm) 04/2012    STENTING OF AAA IN [place]  . Junctional cardiac arrhythmia     Occurred postoperatively after urologic surgery  . Severe sinus bradycardia     Occurred postoperatively after urologic surgery  . Aortic valve disorders   . Other primary cardiomyopathies     Past Surgical History  Procedure Laterality Date  . Abdominal surgery      PART OF COLON REMOVED FOR DIVERTICULITIS  . Appendectomy    . Hernia repair    . Bladder surgery  2008    FOR BLADDER STONE  . Total knee arthroplasty  1990    left  . Abdominal aortic aneurysm repair  04/2012  . Total knee arthroplasty Right 07/26/2012    Procedure: RIGHT TOTAL KNEE ARTHROPLASTY;  Surgeon: Tobi Bastos, MD;  Location: [facility];  Service: Orthopedics;  Laterality: Right;  . Nephrolithotomy Left 04/26/2013    Procedure: LEFT PERCUTANEOUS NEPHROLITHOTOMY ;  Surgeon: [name], MD;  Location: [facility];  Service: Urology;  Laterality: Left;  . Nephrolithotomy Left 05/03/2013    Procedure: 2ND STAGE LEFT PERCUTANEOUS NEPHROLITHOTOMY ;  Surgeon: [name], MD;  Location: [facility];  Service: Urology;  Laterality: Left;    There were no vitals taken for this visit.  Visit Diagnosis: Dysphagia             Plan - 01/23/14 1425    Clinical Impression Statement [name] presents with mild oropharyngeal  phase dysphagia characterized by weak lingual manipulation, slow anterior posterior transit with solids, mild delay in swallow initiation, and premature spillage of thin liquids to the pyriforms without evidence of laryngeal penetration or aspiration. With puree presentations, [name] immediately starts gagging (applesauce only along tongue beneath hard palate) and expectorated bolus on 2 of 2 trials. He tolerated trials of mech soft (cracker with puree) and regular (graham cracker) with no gagging. Mech soft became transiently delayed along soft palate/tip of uvulae (no gag elicited), but he was able to swallow to clear with only minimal lingual and vallecular residue. Pt had mild liquid residue along tongue and palate of dentures. [name] swallowed the barium tablet with thin liquids without incident (transient delay in distal esophagus which cleared after just a few minutes). Pt unable to explain whether he is gagging because of distaste, texture, or stimulation of gag reflux, but simply states, "I just can't swallow that stuff anymore." SLP could find no objective correlates to explain pt's symptoms. Would recommend giving pt a regular texture diet (modify meats for ease of mastication due to need for new dentures) that requires chewing and has texture to it (versus puree) to see if this is tolerated. Recommendations discussed with treating SLP.   Potential to [place]  G-Codes - 01/23/14 1441    Functional Assessment Tool Used MBSS   Functional Limitations Swallowing   Swallow Current Status (H4742) At least 1 percent but less than 20 percent impaired, limited or restricted   Swallow Goal Status (V9563) At least 1 percent but less than 20 percent impaired, limited or restricted   Swallow Discharge Status [phone]) At least 1 percent but less than 20 percent impaired, limited or restricted      Problem List Patient Active Problem List   Diagnosis Date Noted  . Back pain  01/13/2014  . Hypotension 01/13/2014  . DVT of leg (deep venous thrombosis) 06/29/2013  . Supratherapeutic INR 05/17/2013  . Urinary urgency 05/15/2013  . Anticoagulation management encounter 05/15/2013  . Aortic valve disorders   . Other primary cardiomyopathies   . Gross hematuria 05/06/2013  . DVT, lower extremity 05/03/2013  . Thrombocytopenia, unspecified 04/30/2013  . Infection of urinary tract 04/27/2013  . Junctional cardiac arrhythmia   . Severe sinus bradycardia   . Renal stone 04/25/2013  . Postoperative anemia due to acute blood loss 07/28/2012  . Osteoarthritis of right knee 07/26/2012  . Weakness of right leg 05/19/2012  . HTN (hypertension) 05/19/2012  . Abdominal aortic aneurysm 03/28/2012          General - 01/23/14 1411    General Information   Date of Onset 01/20/14   HPI [name] is a medically complex, 80 year-old man who was referred by Dr. Dellia Nims for MBSS due to FTT and "retching" when eating. He is currently residing at [facility] for rehab. He was at [facility] about 2 weeks ago for less than a day before he was returned to hospital, eventually going to [facility] where he was admitted from 01/11/2014 through 01/18/2014. It was also noted that he has significant failure to thrive with weakness, poor appetite, and weight loss. He was admitted in late July for back pain and ileus, and was noted to have an endoleak above the celiac axis stent and at the infrarenal abdominal aortic aneurysm so he underwent a celiac stent extension and right renal stent angioplasty which was complicated by a Pseudomonas graft infection and subsequent Proteus PICC line infection.    Type of Study Modified Barium Swallowing Study   Reason for Referral Objectively evaluate swallowing function   Previous Swallow Assessment None on record   Diet Prior to this Study Dysphagia 3 (soft);Thin liquids   Temperature Spikes Noted No   Respiratory Status Room air   History of Recent Intubation No    Behavior/Cognition Alert;Cooperative   Oral Cavity - Dentition Dentures, top;Dentures, bottom   Oral Motor / Sensory Function Within functional limits  mild lingual weakness   Self-Feeding Abilities Able to feed self   Patient Positioning Upright in chair   Baseline Vocal Quality Clear;Breathy   Volitional Cough Strong   Volitional Swallow Able to elicit   Anatomy Within functional limits   Pharyngeal Secretions Not observed secondary MBS            Oral Preparation/Oral Phase - 01/23/14 1413    Oral Preparation/Oral Phase   Oral Phase Impaired   Oral - Thin   Oral - Thin Cup Within functional limits   Oral - Thin Straw Within functional limits   Oral - Solids   Oral - Puree Other (Comment)  Pt began gagging when apple sauce reached tongue blade and expectorated   Oral - Mechanical Soft Weak lingual manipulation;Incomplete tongue to palate contact;Reduced posterior propulsion;Lingual/palatal residue;Piecemeal swallowing;Delayed  oral transit  brief stasis of bolus along soft palate before swallow, no gag   Oral - Regular Within functional limits   Oral - Pill Within functional limits   Electrical stimulation - Oral Phase   Was Electrical Stimulation Used No          Pharyngeal Phase - 01/23/14 1418    Pharyngeal Phase   Pharyngeal Phase Impaired   Pharyngeal - Thin   Pharyngeal - Thin Cup Premature spillage to valleculae;Premature spillage to pyriform sinuses   Pharyngeal - Thin Straw Premature spillage to valleculae;Premature spillage to pyriform sinuses   Pharyngeal - Solids   Pharyngeal - Puree Not tested  pt expectorated puree presentations before swallow   Pharyngeal - Mechanical Soft Delayed swallow initiation;Premature spillage to valleculae;Reduced anterior laryngeal mobility;Reduced laryngeal elevation;Reduced tongue base retraction;Pharyngeal residue - valleculae   Pharyngeal - Regular Premature spillage to valleculae;Reduced anterior laryngeal  mobility;Reduced laryngeal elevation;Reduced tongue base retraction;Pharyngeal residue - valleculae   Pharyngeal - Pill Within functional limits   Electrical Stimulation - Pharyngeal Phase   Was Electrical Stimulation Used No          Cricopharyngeal Phase - 01/23/14 1421    Cervical Esophageal Phase   Cervical Esophageal Phase [facility]          Recommendations/Treatment - 01/23/14 1421    Swallow Evaluation Recommendations   Diet Recommendations Regular;Thin liquid  chopped meats due to poor dentition   Liquid Administration via Cup;Straw   Medication Administration Whole meds with liquid   Supervision Patient able to self feed   Postural Changes and/or Swallow Maneuvers Out of bed for meals;Seated upright 90 degrees;Upright 30-60 min after meal   Oral Care Recommendations Oral care BID   Other Recommendations Clarify dietary restrictions  pt did better with foods that require mastication   Follow up Recommendations Skilled Nursing facility          Prognosis - 01/23/14 1423    Prognosis   Prognosis for Safe Diet Advance[facility] [place]   Barriers to Reach Goals Motivation   Barriers/Prognosis Comment unknown reason for gagging with puree   Individuals Consulted   Consulted and Agree with Results and Recommendations Patient;Other (Comment)  treating SLP, Merilynn Finland   Report Sent to  Primary SLP;Facility (Comment)        Thank you,  Genene Churn, [place]                               [place] 01/23/2014, 2:52 PM

## 2014-01-25 NOTE — Progress Notes (Addendum)
Patient ID: [name], male   DOB: Jul 30, 1929, 80 y.o.   MRN: [id number]                PROGRESS NOTE  DATE:  01/23/2014    FACILITY: [facility]    LEVEL OF CARE:   SNF   Acute Visit   CHIEF COMPLAINT:  Follow up nausea, vomiting, anorexia.    HISTORY OF PRESENT ILLNESS:  This is a man whom I admitted to the facility two days ago.  He is a medically complex gentleman who underwent repair of a thoracoabdominal aortic aneurysm in July of this year.  Subsequently, this was complicated by a Pseudomonas graft infection (please see my note of 01/21/2014).    The major issue appears to be that the patient literally cannot eat or drink.  As soon as food is put in his mouth, he starts retching and gagging and literally spits it almost halfway across the room.  When I saw him do this, I was impressed by the fact that this did not even look like any form of GI issue or an issue related to his underlying Pseudomonas graft infection.  It happens too quickly.  Nevertheless, he continues not to eat hardly anything.  His drinking is inadequate.  He has lost two pounds since he has been here.  He is going for a swallowing study today.    LABORATORY DATA:  Lab work from 01/23/2014 shows a hemoglobin of 9.5, white count of 4.1.  Platelet count is 107 (cytopenias are not new).  Differential count is normal.    Sodium is 140, potassium 3.3, BUN 15, creatinine 1.12.    CURRENT MEDICATIONS:   Medications are unchanged.  He remains on ASA and Plavix as well as Remeron.    REVIEW OF SYSTEMS:  Complicated by profound hearing loss.  Nevertheless:   CHEST/RESPIRATORY:  He does not complain of shortness of breath.   CARDIAC:   No chest pain.   GI:  No clear dysphagia or odynophagia.    PHYSICAL EXAMINATION:   GENERAL APPEARANCE:  The patient is not in any distress.    CHEST/RESPIRATORY:  Clear air entry bilaterally.   CARDIOVASCULAR:  CARDIAC:  Heart sounds are soft and irregular.  There are  no murmurs.   NEUROLOGICAL:    BALANCE/GAIT:  I had brought him to a standing position with the maximal assist of myself.  He is not able to stand for any length of time.   PSYCHIATRIC:   MENTAL STATUS:   More issues with regards to depression.    ASSESSMENT/PLAN:  Failure to thrive/declining p.o. intake/refractory vomiting.  We are going to do a swallowing study.  If this is unhelpful, I would like to do an upper GI series.  If this is unhelpful and he continues to lose weight with no oral intake, he is going to need a feeding tube.  I may actually have him seen by GI before we do this. 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  I do not think there is any issue here.  He is completing his cefepime 2 g IV q.12.    Depression.  I agree, I think he is depressed.  I am going to increase the Remeron.    He has mild hypokalemia.  I will replace this, as well.

## 2014-01-28 ENCOUNTER — Non-Acute Institutional Stay (SKILLED_NURSING_FACILITY): Payer: Medicare Other | Admitting: Internal Medicine

## 2014-01-28 DIAGNOSIS — R112 Nausea with vomiting, unspecified: Secondary | ICD-10-CM

## 2014-01-28 DIAGNOSIS — R634 Abnormal weight loss: Secondary | ICD-10-CM

## 2014-01-28 DIAGNOSIS — T827XXD Infection and inflammatory reaction due to other cardiac and vascular devices, implants and grafts, subsequent encounter: Secondary | ICD-10-CM

## 2014-01-29 NOTE — Progress Notes (Addendum)
Patient ID: [name], male   DOB: [birth year], 80 y.o.   MRN: [id number]               PROGRESS NOTE  DATE:  01/28/2014    FACILITY: [facility]    LEVEL OF CARE:   SNF   Acute Visit   CHIEF COMPLAINT:  Continued declining p.o. intake.      HISTORY OF PRESENT ILLNESS:  This is an 80 year-old man who came here after a complicated medical history including a thoracoabdominal aortic aneurysm repair in July, complicated by a Pseudomonas graft infection.  He is here completing a course of cefepime, although the exact duration of this is unclear.  This needs to be yet clarified by Vascular Surgery at [facility].    Almost since his arrival here, and indeed this was present at [facility], he has had poor oral intake.  This even appears to be before he will initiate a swallow.  My understanding is that his swallowing study was normal.    His weight as of 01/25/2014 was 217.8 pounds.  He has not lost a lot of weight.  However, his albumin is low at 2.2.  Staff report that he is eating very little, but drinking, including his supplements.    REVIEW OF SYSTEMS:   GI:  He does not describe any abdominal pain, diarrhea, or vomiting.  States he simply cannot eat and then the retching starts as soon as he puts food in his mouth.  He does not clearly describe odynophagia.    PHYSICAL EXAMINATION:   GENERAL APPEARANCE:  Although our weights as of 01/25/2014 did not show a loss, he certainly looks like he has lost weight.   GASTROINTESTINAL:  LIVER/SPLEEN/KIDNEYS:  No liver, no spleen.  No tenderness.   ABDOMEN:  No masses are palpable.   CARDIOVASCULAR:  CARDIAC:   Heart sounds are normal.  He appears to be somewhat dehydrated, although this is not well reflected in his lab work.     ASSESSMENT/PLAN:     Continued declining p.o. intake.  I will have them re-weigh him.  The cause of this is not completely clear.  I think it was ascribed to his infection and/or his treatment at [facility].  I am not  completely sure about this.  If he continues to lose weight, I will have to refer him to GI, up to and including consideration of a PEG tube.  The family has requested Megace in addition to the Remeron, which I am not completely opposed to although I do not have a great deal of faith in this.    Pancytopenia.  This appears to be stable, although the cause of this is not really clear.  ?his antibiotic

## 2014-01-30 ENCOUNTER — Non-Acute Institutional Stay (SKILLED_NURSING_FACILITY): Payer: Medicare Other | Admitting: Internal Medicine

## 2014-01-30 DIAGNOSIS — R112 Nausea with vomiting, unspecified: Secondary | ICD-10-CM

## 2014-01-30 DIAGNOSIS — R634 Abnormal weight loss: Secondary | ICD-10-CM

## 2014-01-30 DIAGNOSIS — T827XXD Infection and inflammatory reaction due to other cardiac and vascular devices, implants and grafts, subsequent encounter: Secondary | ICD-10-CM

## 2014-02-01 ENCOUNTER — Non-Acute Institutional Stay (SKILLED_NURSING_FACILITY): Payer: Medicare Other | Admitting: Internal Medicine

## 2014-02-01 DIAGNOSIS — D696 Thrombocytopenia, unspecified: Secondary | ICD-10-CM

## 2014-02-01 DIAGNOSIS — R627 Adult failure to thrive: Secondary | ICD-10-CM

## 2014-02-01 DIAGNOSIS — R21 Rash and other nonspecific skin eruption: Secondary | ICD-10-CM

## 2014-02-01 NOTE — Progress Notes (Addendum)
Patient ID: [name], male   DOB: Feb 04, 1930, 80 y.o.   MRN: [id number]               PROGRESS NOTE  DATE:  01/30/2014    FACILITY: [facility]    LEVEL OF CARE:   SNF   Acute Visit   CHIEF COMPLAINT:  Continued decline, weight loss.    HISTORY OF PRESENT ILLNESS:  This is a patient whom we originally received from [facility].  He has a Pseudomonas infection in an endovascular graft placed in an abdominal aortic aneurysm in July.     He was readmitted in July for back pain and ileus and was noted to have a leak above the celiac access stent, so he underwent celiac stent extension and right renal artery stent angioplasty.  He is continued on IV antibiotics.    He then presented on 12/16/2013 with weakness and poor p.o. intake.  He again grew Pseudomonas from his blood, concerning for recurrent or persistent graft infection.  He has been discharged on cefepime.    The problem here is that he simply will not eat.  I have tried on several occasions myself to feed this man and, even before it hits the back of his throat, he is retching, spitting up.  This is even before he starts to swallow.  He passed a swallowing test just fine.  He has lost roughly seven pounds from roughly 220 down to 214.6.    Once again, I am seeing him today.  He is eating virtually nothing.    LABORATORY DATA:  Lab work shows an albumin of 2.3.  This is not new.    I have recently become aware of an elevated calcium at 10.9.  Corrected for the albumin reveals a calcium of 12.3.  I think when he was in the ER last month, somebody did an ionized calcium and it was elevated at 1.3.    He has some degree of chronic renal failure at 1.64 today.  This does not appear to be completely new.  I think this is progressive, however, with some degree of dehydration.  His BUN is 33.    REVIEW OF SYSTEMS:   HEENT:  The patient does not complain of oral pain or pain when swallowing.   GI:  He does not complain of  abdominal pain or diarrhea.   CARDIAC:   No clear chest pain.   MUSCULOSKELETAL:  He has chronic low back pain.    PHYSICAL EXAMINATION:   GENERAL APPEARANCE:  The patient is increasingly frail.   CHEST/RESPIRATORY:  Clear air entry bilaterally.   CARDIOVASCULAR:  CARDIAC:  He looks to be dehydrated.   GASTROINTESTINAL:  LIVER/SPLEEN/KIDNEYS:  No liver, no spleen.  No tenderness.    GENITOURINARY:  BLADDER:   Not enlarged.   MUSCULOSKELETAL:  Tenderness at roughly the lower thoracic back.    ASSESSMENT/PLAN:    Progressive failure to thrive and weight loss.  This man is eating virtually nothing, although he seems to drink fine and will take his medications easily.  I am going to order a gastroesophagram just to exclude a structural issue.  There is also an irritated area at his upper oropharynx just proximal to the uvula.  I will need to have a better look at that when he is back in bed.  Failing all of this, I am going to have a discussion with the family about an artificial feeding tube.  I am running out  of suggestions here.  He is on Remeron and Megace.  Of his medications, only Plavix really could be playing a role here.  I am not completely certain why he is on this.  I have never seen Plavix do this.  Also, cefepime itself has crossed my mind as a cause of nausea.  I am doubtful that this is related to his apparent intestinal ischemia  Hypercalcemia.   I am going to order an intact PTH level on him.    Thrombocytopenia.  Listed as chronic.    Also anemic with a hemoglobin of 9.9.    We will wait and see what his calcium work-up comes back as, a myeloma screen possibly.    I am going to talk to the family about a PEG tube today. I would like to arrange this next week if this continues.    CPT CODE: 16109

## 2014-02-01 NOTE — Progress Notes (Signed)
Patient ID: [name], male   DOB: [birth year], 80 y.o.   MRN: [id number]   This is an acute visit.  Level care skilled.  Facility [facility].  Chief complaint-acute visit follow-up rash.  History of present illness.  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  Patient is on a cephalosporin for the suspected infection.  It has been noted that he has some petechial rash at the bottom of his feet bilaterally and also some  petechia of his buttocks  There's been no history of trauma.  I do note he does have a history of thrombocytopenia with most recent platelet count of 82,000-etiology of this is unknown although there is suspicion possibly see an antibiotic he is on for the infection.  His vital signs are stable he has reported by mouth intake and Dr. Dellia Nims has discussed possibility of a feeding tube with the family and family and patient apparently will discuss this with Dr. Dellia Nims next week.  Patient does not complain of chest pain or shortness of breath today does complain of feeling weak which is baseline.  Family medical social history is been reviewed per previous progress notes including 01/21/2014.  Medications have been reviewed per MAR.  Review of systems.  Limited secondary to patient being somewhat poor historian.  In general is not complaining of fever chills.  Respiratory is not complaining of cough or shortness of breath.  Cardiac does not complain of chest pain currently.  GI and has a poor appetite but does not complaining of abdominal discomfort has apparently had some nausea vomiting in the past.  Muscle skeletal has significant weakness which appears to be progressive is not complaining of joint pain currently.  Neurologic does not complain of dizziness or headache.  Physical exam.  Temp is 97.5  pulse 75 respirations 20 blood pressure 130/70.  In general this is a frail elderly male he does not appear to be in any distress but is quite weak.  Skin is warm and dry there is a slight petechial type rash in the bombing of his feet bilaterally and also on his buttocks I do not see a rash elsewhere.  Eyes sclera and conjunctiva are clear.  Visual acuity appears grossly intact.  Oropharynx is clear mucous membranes moist.  Chest is clear to auscultation with somewhat poor respiratory effort there is no labored breathing.  Heart is distant heart sounds from what I could hear regular with occasional irregular beats.  Abdomen is soft nontender with positive bowel sounds.  Muscle skeletal has significant weakness in all extremities more so lower extremities.  Neurologic from what I could see is grossly intact with no lateralizing findings her speech is clear.  Labs.  01/30/2014.  WBC 4.6 hemoglobin 9.9 platelets 82.  Sodium 143 potassium 3.9 BUN 33 creatinine 1.64-liver function test within normal limits except albumin of 2.3-calcium of 10.9.  November 16 hemoglobin was 9.2 with platelets of 84,000 and white count of 3.7.  Assessment and plan.  #1-history of rash and nonspecific-this appears to be petechial I suspect his low platelet count may be contributing to this this does not appear to be a generalized rash but will have to be monitored closely-also will update lab work tomorrow to see where his platelet count stands.  #2-history of failure to thrive-as noted above Dr. Dellia Nims has been following this with  family discussion--for now will update a metabolic panel-clinically he does not appear to be unstable but is a very fragile individual.  #3-history of aortic graft infection-again has been on antibiotics possibly the rash may be due to this with the pancytopenia-at this point will monitor.  [id number] note greater than 25 minutes spent assessing patient-discussing  patient's status with nursing staff as well as with his family at bedside and reviewing his chart-and coordinating and formulating a plan of care-

## 2014-02-02 ENCOUNTER — Encounter: Payer: Self-pay | Admitting: Internal Medicine

## 2014-02-02 DIAGNOSIS — R21 Rash and other nonspecific skin eruption: Secondary | ICD-10-CM | POA: Insufficient documentation

## 2014-02-02 DIAGNOSIS — R627 Adult failure to thrive: Secondary | ICD-10-CM | POA: Insufficient documentation

## 2014-02-04 ENCOUNTER — Other Ambulatory Visit (HOSPITAL_COMMUNITY): Payer: Medicare Other

## 2014-02-04 ENCOUNTER — Non-Acute Institutional Stay (SKILLED_NURSING_FACILITY): Payer: Medicare Other | Admitting: Internal Medicine

## 2014-02-04 ENCOUNTER — Ambulatory Visit (HOSPITAL_COMMUNITY)
Admit: 2014-02-04 | Discharge: 2014-02-04 | Disposition: A | Discharge status: Home / Self Care | Payer: Medicare Other | Source: Skilled Nursing Facility | Attending: Internal Medicine | Admitting: Internal Medicine

## 2014-02-04 ENCOUNTER — Encounter (HOSPITAL_COMMUNITY): Payer: Medicare Other | Admitting: Speech Pathology

## 2014-02-04 DIAGNOSIS — R627 Adult failure to thrive: Secondary | ICD-10-CM

## 2014-02-04 DIAGNOSIS — R1310 Dysphagia, unspecified: Secondary | ICD-10-CM | POA: Insufficient documentation

## 2014-02-04 DIAGNOSIS — D696 Thrombocytopenia, unspecified: Secondary | ICD-10-CM

## 2014-02-04 DIAGNOSIS — R634 Abnormal weight loss: Secondary | ICD-10-CM

## 2014-02-04 DIAGNOSIS — T827XXD Infection and inflammatory reaction due to other cardiac and vascular devices, implants and grafts, subsequent encounter: Secondary | ICD-10-CM

## 2014-02-04 DIAGNOSIS — R110 Nausea: Secondary | ICD-10-CM | POA: Insufficient documentation

## 2014-02-05 ENCOUNTER — Encounter: Payer: Self-pay | Admitting: Gastroenterology

## 2014-02-06 ENCOUNTER — Emergency Department (HOSPITAL_COMMUNITY): Payer: Medicare Other

## 2014-02-06 ENCOUNTER — Non-Acute Institutional Stay (SKILLED_NURSING_FACILITY): Payer: Medicare Other | Admitting: Internal Medicine

## 2014-02-06 ENCOUNTER — Inpatient Hospital Stay (HOSPITAL_COMMUNITY)
Admission: EM | Admit: 2014-02-06 | Discharge: 2014-02-13 | DRG: 640 | Disposition: A | Discharge status: Skilled Nursing Facility | Payer: Medicare Other | Attending: Internal Medicine | Admitting: Internal Medicine

## 2014-02-06 ENCOUNTER — Encounter (HOSPITAL_COMMUNITY): Payer: Self-pay | Admitting: *Deleted

## 2014-02-06 DIAGNOSIS — Z923 Personal history of irradiation: Secondary | ICD-10-CM | POA: Diagnosis not present

## 2014-02-06 DIAGNOSIS — E43 Unspecified severe protein-calorie malnutrition: Secondary | ICD-10-CM | POA: Diagnosis present

## 2014-02-06 DIAGNOSIS — Z6826 Body mass index (BMI) 26.0-26.9, adult: Secondary | ICD-10-CM | POA: Diagnosis not present

## 2014-02-06 DIAGNOSIS — I10 Essential (primary) hypertension: Secondary | ICD-10-CM | POA: Diagnosis present

## 2014-02-06 DIAGNOSIS — Z87891 Personal history of nicotine dependence: Secondary | ICD-10-CM | POA: Diagnosis not present

## 2014-02-06 DIAGNOSIS — Z7902 Long term (current) use of antithrombotics/antiplatelets: Secondary | ICD-10-CM | POA: Diagnosis not present

## 2014-02-06 DIAGNOSIS — E860 Dehydration: Principal | ICD-10-CM | POA: Diagnosis present

## 2014-02-06 DIAGNOSIS — E785 Hyperlipidemia, unspecified: Secondary | ICD-10-CM | POA: Diagnosis present

## 2014-02-06 DIAGNOSIS — R627 Adult failure to thrive: Secondary | ICD-10-CM | POA: Diagnosis present

## 2014-02-06 DIAGNOSIS — K449 Diaphragmatic hernia without obstruction or gangrene: Secondary | ICD-10-CM | POA: Diagnosis present

## 2014-02-06 DIAGNOSIS — D696 Thrombocytopenia, unspecified: Secondary | ICD-10-CM | POA: Diagnosis present

## 2014-02-06 DIAGNOSIS — I714 Abdominal aortic aneurysm, without rupture, unspecified: Secondary | ICD-10-CM | POA: Diagnosis present

## 2014-02-06 DIAGNOSIS — Z801 Family history of malignant neoplasm of trachea, bronchus and lung: Secondary | ICD-10-CM

## 2014-02-06 DIAGNOSIS — Z79899 Other long term (current) drug therapy: Secondary | ICD-10-CM

## 2014-02-06 DIAGNOSIS — Z09 Encounter for follow-up examination after completed treatment for conditions other than malignant neoplasm: Secondary | ICD-10-CM

## 2014-02-06 DIAGNOSIS — R531 Weakness: Secondary | ICD-10-CM

## 2014-02-06 DIAGNOSIS — R55 Syncope and collapse: Secondary | ICD-10-CM

## 2014-02-06 DIAGNOSIS — H9190 Unspecified hearing loss, unspecified ear: Secondary | ICD-10-CM | POA: Diagnosis present

## 2014-02-06 DIAGNOSIS — R1310 Dysphagia, unspecified: Secondary | ICD-10-CM | POA: Diagnosis present

## 2014-02-06 DIAGNOSIS — E876 Hypokalemia: Secondary | ICD-10-CM | POA: Diagnosis present

## 2014-02-06 DIAGNOSIS — Z4659 Encounter for fitting and adjustment of other gastrointestinal appliance and device: Secondary | ICD-10-CM | POA: Insufficient documentation

## 2014-02-06 DIAGNOSIS — N179 Acute kidney failure, unspecified: Secondary | ICD-10-CM

## 2014-02-06 DIAGNOSIS — Z8546 Personal history of malignant neoplasm of prostate: Secondary | ICD-10-CM | POA: Diagnosis not present

## 2014-02-06 DIAGNOSIS — Z7982 Long term (current) use of aspirin: Secondary | ICD-10-CM

## 2014-02-06 DIAGNOSIS — Z96653 Presence of artificial knee joint, bilateral: Secondary | ICD-10-CM | POA: Diagnosis present

## 2014-02-06 LAB — COMPREHENSIVE METABOLIC PANEL
ALBUMIN: 2.4 g/dL — AB (ref 3.5–5.2)
ALK PHOS: 57 U/L (ref 39–117)
ALT: <5 U/L (ref 0–53)
AST: 13 U/L (ref 0–37)
Anion gap: 14 (ref 5–15)
BUN: 43 mg/dL — ABNORMAL HIGH (ref 6–23)
CHLORIDE: 103 meq/L (ref 96–112)
CO2: 23 mEq/L (ref 19–32)
Calcium: 11.1 mg/dL — ABNORMAL HIGH (ref 8.4–10.5)
Creatinine, Ser: 2.03 mg/dL — ABNORMAL HIGH (ref 0.50–1.35)
GFR calc Af Amer: 33 mL/min — ABNORMAL LOW (ref >90)
GFR calc non Af Amer: 29 mL/min — ABNORMAL LOW (ref >90)
Glucose, Bld: 79 mg/dL (ref 70–99)
POTASSIUM: 3.5 meq/L — AB (ref 3.7–5.3)
Sodium: 140 mEq/L (ref 137–147)
Total Bilirubin: 0.6 mg/dL (ref 0.3–1.2)
Total Protein: 7.3 g/dL (ref 6.0–8.3)

## 2014-02-06 LAB — CBC WITH DIFFERENTIAL/PLATELET
BASOS ABS: 0 10*3/uL (ref 0.0–0.1)
Basophils Relative: 0 % (ref 0–1)
EOS ABS: 0 10*3/uL (ref 0.0–0.7)
Eosinophils Relative: 1 % (ref 0–5)
HCT: 31.4 % — ABNORMAL LOW (ref 39.0–52.0)
HEMOGLOBIN: 10.1 g/dL — AB (ref 13.0–17.0)
LYMPHS PCT: 14 % (ref 12–46)
Lymphs Abs: 0.7 10*3/uL (ref 0.7–4.0)
MCH: 26.6 pg (ref 26.0–34.0)
MCHC: 32.2 g/dL (ref 30.0–36.0)
MCV: 82.6 fL (ref 78.0–100.0)
Monocytes Absolute: 0.5 10*3/uL (ref 0.1–1.0)
Monocytes Relative: 11 % (ref 3–12)
NEUTROS PCT: 74 % (ref 43–77)
Neutro Abs: 3.6 10*3/uL (ref 1.7–7.7)
Platelets: 72 10*3/uL — ABNORMAL LOW (ref 150–400)
RBC: 3.8 MIL/uL — ABNORMAL LOW (ref 4.22–5.81)
RDW: 19.3 % — AB (ref 11.5–15.5)
WBC: 4.8 10*3/uL (ref 4.0–10.5)

## 2014-02-06 LAB — URINALYSIS, ROUTINE W REFLEX MICROSCOPIC
Bilirubin Urine: NEGATIVE
GLUCOSE, UA: NEGATIVE mg/dL
Leukocytes, UA: NEGATIVE
Nitrite: NEGATIVE
Protein, ur: 100 mg/dL — AB
UROBILINOGEN UA: 0.2 mg/dL (ref 0.0–1.0)
pH: 5.5 (ref 5.0–8.0)

## 2014-02-06 LAB — URINE MICROSCOPIC-ADD ON

## 2014-02-06 LAB — GLUCOSE, CAPILLARY: GLUCOSE-CAPILLARY: 78 mg/dL (ref 70–99)

## 2014-02-06 LAB — CBG MONITORING, ED: GLUCOSE-CAPILLARY: 75 mg/dL (ref 70–99)

## 2014-02-06 LAB — TROPONIN I: Troponin I: <0.3 ng/mL (ref <0.30)

## 2014-02-06 LAB — I-STAT CG4 LACTIC ACID, ED: Lactic Acid, Venous: 1.41 mmol/L (ref 0.5–2.2)

## 2014-02-06 LAB — LIPASE, BLOOD: Lipase: 69 U/L — ABNORMAL HIGH (ref 11–59)

## 2014-02-06 MED ORDER — DOXAZOSIN MESYLATE 2 MG PO TABS
4.0000 mg | ORAL_TABLET | Freq: Every day | ORAL | Status: DC
  Administered 2014-02-06 – 2014-02-12 (×6): 4 mg via ORAL
  Filled 2014-02-06 (×6): qty 2

## 2014-02-06 MED ORDER — MEGESTROL ACETATE 400 MG/10ML PO SUSP
400.0000 mg | Freq: Every day | ORAL | Status: DC
  Administered 2014-02-07 – 2014-02-13 (×4): 400 mg via ORAL
  Filled 2014-02-06 (×6): qty 10

## 2014-02-06 MED ORDER — DOCUSATE SODIUM 100 MG PO CAPS: 100.0000 mg | ORAL_CAPSULE | Freq: Every day | ORAL | Status: DC | PRN

## 2014-02-06 MED ORDER — SENNA 8.6 MG PO TABS
2.0000 | ORAL_TABLET | Freq: Every day | ORAL | Status: DC
  Administered 2014-02-06 – 2014-02-12 (×6): 17.2 mg via ORAL
  Filled 2014-02-06 (×6): qty 2

## 2014-02-06 MED ORDER — DEXTROSE 5 % IV SOLN
2.0000 g | INTRAVENOUS | Status: DC
  Administered 2014-02-06 – 2014-02-12 (×7): 2 g via INTRAVENOUS
  Filled 2014-02-06 (×8): qty 2

## 2014-02-06 MED ORDER — SODIUM CHLORIDE 0.9 % IV BOLUS (SEPSIS)
500.0000 mL | Freq: Once | INTRAVENOUS | Status: AC
  Administered 2014-02-06: 500 mL via INTRAVENOUS

## 2014-02-06 MED ORDER — ONDANSETRON HCL 4 MG PO TABS: 4.0000 mg | ORAL_TABLET | Freq: Four times a day (QID) | ORAL | Status: DC | PRN

## 2014-02-06 MED ORDER — SODIUM CHLORIDE 0.9 % IJ SOLN
3.0000 mL | Freq: Two times a day (BID) | INTRAMUSCULAR | Status: DC
  Administered 2014-02-07 – 2014-02-13 (×5): 3 mL via INTRAVENOUS

## 2014-02-06 MED ORDER — ONDANSETRON HCL 4 MG/2ML IJ SOLN
4.0000 mg | Freq: Four times a day (QID) | INTRAMUSCULAR | Status: DC | PRN
  Administered 2014-02-10: 4 mg via INTRAVENOUS
  Filled 2014-02-06: qty 2

## 2014-02-06 MED ORDER — METOPROLOL SUCCINATE ER 25 MG PO TB24
25.0000 mg | ORAL_TABLET | Freq: Every day | ORAL | Status: DC
  Administered 2014-02-07 – 2014-02-13 (×6): 25 mg via ORAL
  Filled 2014-02-06 (×6): qty 1

## 2014-02-06 MED ORDER — DEXTROSE 5 % IV SOLN
INTRAVENOUS | Status: AC
  Filled 2014-02-06: qty 2

## 2014-02-06 MED ORDER — LORAZEPAM 0.5 MG PO TABS: 0.2500 mg | ORAL_TABLET | Freq: Three times a day (TID) | ORAL | Status: DC | PRN

## 2014-02-06 MED ORDER — POLYETHYLENE GLYCOL 3350 17 G PO PACK
17.0000 g | PACK | Freq: Every day | ORAL | Status: DC | PRN
Start: 2014-02-06 — End: 2014-02-13

## 2014-02-06 MED ORDER — MIRTAZAPINE 30 MG PO TABS
15.0000 mg | ORAL_TABLET | Freq: Every day | ORAL | Status: DC
  Administered 2014-02-06 – 2014-02-12 (×6): 15 mg via ORAL
  Filled 2014-02-06 (×6): qty 1

## 2014-02-06 MED ORDER — CLOPIDOGREL BISULFATE 75 MG PO TABS
75.0000 mg | ORAL_TABLET | Freq: Every day | ORAL | Status: DC
  Administered 2014-02-07: 75 mg via ORAL
  Filled 2014-02-06: qty 1

## 2014-02-06 MED ORDER — SODIUM CHLORIDE 0.9 % IV SOLN
INTRAVENOUS | Status: DC
  Administered 2014-02-06 – 2014-02-08 (×4): via INTRAVENOUS

## 2014-02-06 MED ORDER — SODIUM CHLORIDE 0.9 % IV SOLN
Freq: Once | INTRAVENOUS | Status: AC
  Administered 2014-02-06: 15:00:00 via INTRAVENOUS

## 2014-02-06 MED ORDER — ACETAMINOPHEN 500 MG PO TABS: 1000.0000 mg | ORAL_TABLET | Freq: Four times a day (QID) | ORAL | Status: DC | PRN

## 2014-02-06 MED ORDER — PANTOPRAZOLE SODIUM 40 MG PO TBEC
40.0000 mg | DELAYED_RELEASE_TABLET | Freq: Every day | ORAL | Status: DC
  Administered 2014-02-07 – 2014-02-13 (×6): 40 mg via ORAL
  Filled 2014-02-06 (×6): qty 1

## 2014-02-06 MED ORDER — ASPIRIN 81 MG PO CHEW
81.0000 mg | CHEWABLE_TABLET | Freq: Every day | ORAL | Status: DC
  Administered 2014-02-07 – 2014-02-13 (×6): 81 mg via ORAL
  Filled 2014-02-06 (×6): qty 1

## 2014-02-06 MED ORDER — TRAMADOL HCL 50 MG PO TABS: 50.0000 mg | ORAL_TABLET | Freq: Four times a day (QID) | ORAL | Status: DC | PRN

## 2014-02-06 MED ORDER — SIMVASTATIN 20 MG PO TABS
20.0000 mg | ORAL_TABLET | Freq: Every day | ORAL | Status: DC
  Administered 2014-02-06 – 2014-02-13 (×7): 20 mg via ORAL
  Filled 2014-02-06 (×7): qty 1

## 2014-02-06 NOTE — ED Provider Notes (Signed)
CSN: [id number]     Arrival date & time 02/06/14  1407 History  This chart was scribed for Tanna Furry, MD by Zola Button, ED Scribe. This patient was seen in room APA05/APA05 and the patient's care was started at 2:26 PM.       Chief Complaint  Patient presents with  . Loss of Consciousness    The history is provided by the patient and a relative. No language interpreter was used.   HPI Comments: [name] is a 80 y.o. male who presents to the Emergency Department complaining of loss of consciousness that occurred PTA. Patient was sent from [facility] for witnessed syncopal episode. Per daughter, patient had been noted to have been acting strange this morning; for example he did not want to take his medicine, he would not eat, he took out his hearing aid, and he just did not seem to be himself. His daughter did not witness the syncopal episode however; the episode occurred after she left. Patient was found by staff slumped over in his wheelchair and was unresponsive. Daughter states that the patient had recent blood work last week, and was told that he was not dehydrated then. Per daughter, patient receives IV abx BID for bacterial infection, and he is also on oral medications. Daughter states that the patient does not drink enough fluids and has lost 12 pounds over the past 2 weeks.  Patient himself recalls some parts of when he experienced LOC, but not a lot. He notes having back pain. Patient denies chest pain, abdominal pain, tremors, and chills.  Patient also has a petechial rash on the toes of his left foot.  Past Medical History  Diagnosis Date  . Hypertension   . BPH (benign prostatic hyperplasia)   . Hyperlipidemia   . Cancer 2008-2009    prostate TREATED WITH RADIATION  . AAA (abdominal aortic aneurysm) 04/2012    STENTING OF AAA IN [place]  . Junctional cardiac arrhythmia     Occurred postoperatively after urologic surgery  . Severe sinus bradycardia     Occurred  postoperatively after urologic surgery  . Aortic valve disorders   . Other primary cardiomyopathies    Past Surgical History  Procedure Laterality Date  . Abdominal surgery      PART OF COLON REMOVED FOR DIVERTICULITIS  . Appendectomy    . Hernia repair    . Bladder surgery  2008    FOR BLADDER STONE  . Total knee arthroplasty  1990    left  . Abdominal aortic aneurysm repair  04/2012  . Total knee arthroplasty Right 07/26/2012    Procedure: RIGHT TOTAL KNEE ARTHROPLASTY;  Surgeon: Tobi Bastos, MD;  Location: [facility];  Service: Orthopedics;  Laterality: Right;  . Nephrolithotomy Left 04/26/2013    Procedure: LEFT PERCUTANEOUS NEPHROLITHOTOMY ;  Surgeon: [name], MD;  Location: [facility];  Service: Urology;  Laterality: Left;  . Nephrolithotomy Left 05/03/2013    Procedure: 2ND STAGE LEFT PERCUTANEOUS NEPHROLITHOTOMY ;  Surgeon: [name], MD;  Location: [facility];  Service: Urology;  Laterality: Left;   Family History  Problem Relation Age of Onset  . Lung cancer Father    History  Substance Use Topics  . Smoking status: Former Smoker -- 2.00 packs/day for 37 years    Types: Cigarettes    Start date: 02/17/1949    Quit date: 03/15/1985  . Smokeless tobacco: Never Used     Comment: quit 25 years ago  . Alcohol  Use: No    Review of Systems  Constitutional: Positive for unexpected weight change. Negative for fever, chills, diaphoresis, appetite change and fatigue.  HENT: Negative for mouth sores, sore throat and trouble swallowing.   Eyes: Negative for visual disturbance.  Respiratory: Negative for cough, chest tightness, shortness of breath and wheezing.   Cardiovascular: Positive for syncope. Negative for chest pain.  Gastrointestinal: Negative for nausea, vomiting, abdominal pain, diarrhea and abdominal distention.  Endocrine: Negative for polydipsia, polyphagia and polyuria.  Genitourinary: Negative for dysuria, frequency and hematuria.  Musculoskeletal: Positive for back  pain. Negative for gait problem.  Skin: Positive for rash. Negative for color change and pallor.  Neurological: Positive for syncope. Negative for dizziness, tremors, light-headedness and headaches.  Hematological: Does not bruise/bleed easily.  Psychiatric/Behavioral: Negative for behavioral problems and confusion.      Allergies  Atorvastatin and Penicillins  Home Medications   Prior to Admission medications   Medication Sig Start Date End Date Taking? Authorizing Provider  acetaminophen (TYLENOL) 500 MG tablet Take 1,000 mg by mouth every 6 (six) hours as needed for mild pain or moderate pain.   Yes Historical Provider, MD  aspirin 81 MG chewable tablet Chew 81 mg by mouth daily.  10/11/13 10/11/14 Yes Historical Provider, MD  ceFEPIme 2 g in dextrose 5 % 50 mL Inject 2 g into the vein at bedtime.  01/10/14 02/26/14 Yes Historical Provider, MD  clopidogrel (PLAVIX) 75 MG tablet Take 75 mg by mouth daily. 01/10/14 01/10/15 Yes Historical Provider, MD  docusate sodium (COLACE) 100 MG capsule Take 100 mg by mouth daily as needed for mild constipation.   Yes Historical Provider, MD  doxazosin (CARDURA) 4 MG tablet Take 4 mg by mouth at bedtime.   Yes Historical Provider, MD  LORazepam (ATIVAN) 0.5 MG tablet Take 1/2 tablet by mouth every 8 hours as needed for anxiety Patient taking differently: Take 0.25 mg by mouth every 8 (eight) hours as needed for anxiety. Take 1/2 tablet by mouth every 8 hours as needed for anxiety 01/21/14  Yes Lauree Chandler, NP  megestrol (MEGACE) 400 MG/10ML suspension Take 400 mg by mouth daily.   Yes Historical Provider, MD  metoprolol succinate (TOPROL-XL) 25 MG 24 hr tablet Take 25 mg by mouth daily.   Yes Historical Provider, MD  mirtazapine (REMERON) 15 MG tablet Take 15 mg by mouth at bedtime.   Yes Historical Provider, MD  omeprazole (PRILOSEC) 40 MG capsule Take 40 mg by mouth daily.   Yes Historical Provider, MD  polyethylene glycol (MIRALAX / GLYCOLAX)  packet Take 17 g by mouth daily as needed for mild constipation.    Yes Historical Provider, MD  senna (SENOKOT) 8.6 MG TABS tablet Take 2 tablets by mouth at bedtime.   Yes Historical Provider, MD  simvastatin (ZOCOR) 20 MG tablet Take 20 mg by mouth daily.   Yes Historical Provider, MD  traMADol (ULTRAM) 50 MG tablet Take 50 mg by mouth every 6 (six) hours as needed for moderate pain.   Yes Historical Provider, MD   BP 149/83 mmHg  Pulse 78  Temp(Src) 98.1 F (36.7 C) (Oral)  Resp 18  Ht 6\' 2"  (1.88 m)  Wt 207 lb 8 oz (94.121 kg)  BMI 26.63 kg/m2  SpO2 100% Physical Exam  Constitutional: He is oriented to person, place, and time. He appears well-developed and well-nourished. No distress.  Appears weak. Hard of hearing.  HENT:  Head: Normocephalic.  Mouth/Throat: Mucous membranes are dry.  Mucus  membranes dry.  Eyes: Conjunctivae are normal. Pupils are equal, round, and reactive to light. No scleral icterus.  Conjunctiva not pale.  Neck: Normal range of motion. Neck supple. No thyromegaly present.  Cardiovascular: Normal rate, regular rhythm and normal heart sounds.  Exam reveals no gallop and no friction rub.   No murmur heard. Regular. Occasional PVCs.  Pulmonary/Chest: Effort normal and breath sounds normal. No respiratory distress. He has no wheezes. He has no rales.  Abdominal: Soft. Bowel sounds are normal. He exhibits no distension. There is no tenderness. There is no rebound.  Musculoskeletal: Normal range of motion.  Neurological: He is alert and oriented to person, place, and time.  Skin: Skin is warm and dry. Rash noted.  Petechial rash between toes of his left foot.  Psychiatric: He has a normal mood and affect. His behavior is normal.  Nursing note and vitals reviewed.   ED Course  Procedures  DIAGNOSTIC STUDIES: Oxygen Saturation is 98% on room air, normal by my interpretation.    COORDINATION OF CARE: 2:34 PM-Discussed treatment plan which includes labs,  imaging and medication with pt at bedside and pt agreed to plan.    Labs Review Labs Reviewed  CBC WITH DIFFERENTIAL - Abnormal; Notable for the following:    RBC 3.80 (*)    Hemoglobin 10.1 (*)    HCT 31.4 (*)    RDW 19.3 (*)    Platelets 72 (*)    All other components within normal limits  COMPREHENSIVE METABOLIC PANEL - Abnormal; Notable for the following:    Potassium 3.5 (*)    BUN 43 (*)    Creatinine, Ser 2.03 (*)    Calcium 11.1 (*)    Albumin 2.4 (*)    GFR calc non Af Amer 29 (*)    GFR calc Af Amer 33 (*)    All other components within normal limits  URINALYSIS, ROUTINE W REFLEX MICROSCOPIC - Abnormal; Notable for the following:    Specific Gravity, Urine >1.030 (*)    Hgb urine dipstick LARGE (*)    Ketones, ur TRACE (*)    Protein, ur 100 (*)    All other components within normal limits  LIPASE, BLOOD - Abnormal; Notable for the following:    Lipase 69 (*)    All other components within normal limits  URINE MICROSCOPIC-ADD ON - Abnormal; Notable for the following:    Bacteria, UA FEW (*)    All other components within normal limits  URINE CULTURE  TROPONIN I  GLUCOSE, CAPILLARY  COMPREHENSIVE METABOLIC PANEL  CBC  TSH  CBG MONITORING, ED  I-STAT CG4 LACTIC ACID, ED    Imaging Review Dg Chest Port 1 View  02/06/2014   CLINICAL DATA:  Weakness.  EXAM: PORTABLE CHEST - 1 VIEW  COMPARISON:  January 11, 2014.  FINDINGS: Stable cardiomediastinal silhouette. Stent graft is again noted projected over descending thoracic aorta. No pneumothorax or significant pleural effusion is noted. Hypoinflation of the lungs is again noted. Right-sided PICC line is again noted and unchanged. Minimal subsegmental atelectasis is noted in left lung base. Right lung is clear. Bony thorax is intact.  IMPRESSION: Hypoinflation of the lungs is noted which is unchanged compared to prior exam. Minimal left basilar subsegmental atelectasis is noted.   Electronically Signed   By: Sabino Dick M.D.   On: 02/06/2014 15:15     EKG Interpretation None      MDM   Final diagnoses:  Weakness  Acute kidney injury  Dehydration    Patient has elevation of creatinine. Corrected calcium is 12.5 when corrected for his low albumin. He will be admitted. Care discussed with hospitalist.   Tanna Furry, MD 02/06/14 2200

## 2014-02-06 NOTE — Progress Notes (Signed)
Patient ID: [name], male   DOB: November 04, 1929, 80 y.o.   MRN: [id number] Patient ID: [name], male   DOB: 06-28-1929, 80 y.o.   MRN: [id number]   This is an acute visit.  Level care skilled.  Facility [facility].  Chief complaint-acute visit follow-up syncopal episode-hypotension.  History of present illness.  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  Patient is on a cephalosporin for the suspected infection.  This is been complicated with a significant history of failure to thrive-patient has a very poor appetite-this has been addressed by Dr. Dellia Nims with the family and apparently there are wishes for a PEG tube if appetite does not improve.  Apparently patient recently did show signs of somewhat improved appetite-however talking with nursing today in the last few days this has again declined work his by mouth intake is very minimal.  Apparently patient was sitting in his wheelchair today when he essentially had an unresponsive episode.  Patient's pulse was difficult to auscultate his blood pressure showed a systolic in the 36I.  When I assessed him he had been placed in bed-his blood pressure had risen to 100/55 which I took manually-he was responding but weak at that time.  Other vital signs appear to be unremarkable except his pulse was difficult to palpate--I was able to auscultate and appeared he had regular rate and rhythm with some irregular beats-.  An EKG was done which showed sinus tachycardia with frequent PVCs.  Patient had lab done earlier today which showed a hemoglobin 9.5 white count of 5.5 and platelets of 71,000 he does have recent thrombocytopenia thought possibly to be medication related with his antibiotic.  Metabolic panel did show a creatinine of 1.88 which is slightly up from his recent baseline with  a BUN of 42-sodium 140 potassium 3.5-calcium continues to be elevated at 10.6.  I did discuss patient's status with his daughter via phone-and they wish for him to have ER evaluation-there is concern here that patient has very minimal by mouth intake currently and I suspect will need aggressive hydration as well as intervention with PEG tube placement.        .  Family medical social history is been reviewed per previous progress notes including 01/21/2014.  Medications have been reviewed per MAR.  Review of systems.  Limited secondary to patient being somewhat poor historian.  In general is not complaining of fever chills.  Respiratory is not complaining of cough or shortness of breath.  Cardiac does not complain of chest pain currently.  GI and has a poor appetite but does not complaining of abdominal discomfort-no vomiting noted although apparently he has had some dry heaves in the past.  Muscle skeletal has significant weakness which appears to be progressive is not complaining of joint pain currently.  Neurologic does not complain of dizziness or headache.--He did have what appears to have been a syncopal episode  Physical exam.  He is afebrile pulse ranges between 80-100-respirations 18 pressure initially systolic in the 68E that this rise to 100/55-and when I again reevaluated him before transfer to ER was 100/70-O2 sats ration was in the high 90s.   In general this is a frail elderly male he does not appear to be in any distress but is quite weak--he is responsive but extremely weak.  Skin is warm and dry --skin  turgor appear somewhat compromised .  Eyes sclera and conjunctiva are clear.  Visual acuity appears grossly intact.  Oropharynx continues to have a slight film on the tongue.  Chest is clear to auscultation --with shallow air entry --with somewhat poor respiratory effort there is no labored breathing.  Heart is distant heart sounds from what I could  hear regular with occasional irregular beats.  Abdomen is soft nontender with positive bowel sounds.  Muscle skeletal has significant weakness in all extremities more so lower extremities.  Neurologic from what I could see is grossly intact with no lateralizing findings -- speech is clear.--Was alert when I initially assessed him and continued to be alert throughout reassessments   Psych-he is alert and responsive appears to be at his mental status baseline  Labs  02/06/2014.  WBC 5.5 hemoglobin 9.5 platelets 71,000.  Sodium 140 potassium 3.5 CO2 25 BUN 42 creatinine 1.88.  .  01/30/2014.  WBC 4.6 hemoglobin 9.9 platelets 82.  Sodium 143 potassium 3.9 BUN 33 creatinine 1.64-liver function test within normal limits except albumin of 2.3-calcium of 10.9.  November 16 hemoglobin was 9.2 with platelets of 84,000 and white count of 3.7.  Assessment and plan.  #1-suspected syncopal episode with hypotension-history of failure to thrive-patient continues to be extremely weak with minimal by mouth intake--I did discuss patient's clinical status with his daughter via phone- family would like him evaluated in the ER-I suspect again he will need aggressive rehydration secondary to his significant failure to thrive issues-and I suspect will need fairly expedient PEG tube placement as well-again family would desire that this be looked at in the ER in an expedient manner and we will sedt him to the ER  Clinically patient remained stable during serial exams but is a very fragile individual.  [id number] .

## 2014-02-06 NOTE — ED Notes (Signed)
Pt sent from [facility] for witnessed syncopal episode, states pt slumped over in wheelchair and was unresponsive. Pt alert and oriented x3 at triage. Pt's neuro assessment by triage nurse [facility].

## 2014-02-06 NOTE — H&P (Signed)
[facility] History and Physical  [name] WGY:[id number] DOB: 1929/09/15 DOA: 02/06/2014  Referring physician: ER PCP: [name], MD   Chief Complaint: Syncope.  HPI: [name] is a 80 y.o. male  This is an 80 year old man who is currently at a skilled nursing facility, [facility], and he apparently had a very short episode of syncopal episode today. He was noted to be quite dehydrated this week, confirmed on blood work. He has been very anorexic in the last week and has not eaten or drunk anything. He has been losing weight, apparently 12 pounds in the last few weeks. 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 He has been having intravenous cefepime, apparently this is going to be discontinued in December, according to the daughter at the bedside. At the nursing home, he has had very poor oral intake and has not been able to walk independently. Even with a walker, he requires tremendous help. He has had a swallowing study done and this was apparently normal. The nursing home physician, Dr. Dellia Nims, had spoken to the family about PEG tube feeding for nutritional support and the patient has agreed to this. He is now being admitted for his significant dehydration.   Review of Systems:  Constitutional:  No night sweats, Fevers, chills, fatigue.  HEENT:  No headaches, Difficulty swallowing,Tooth/dental problems,Sore throat,  No sneezing, itching, ear ache, nasal congestion, post nasal drip,  Cardio-vascular:  No chest pain, Orthopnea, PND, swelling in lower extremities, anasarca, dizziness, palpitations  GI:  No heartburn, indigestion, abdominal pain, nausea, vomiting, diarrhea, change in bowel habits Resp:  No shortness of breath with exertion or at rest. No excess mucus, no productive cough, No non-productive cough, No  coughing up of blood.No change in color of mucus.No wheezing.No chest wall deformity  Skin:  no rash or lesions.  GU:  no dysuria, change in color of urine, no urgency or frequency. No flank pain.  Musculoskeletal:  No joint pain or swelling. No decreased range of motion. No back pain.  Psych:  No change in mood or affect. No depression or anxiety. No memory loss.   Past Medical History  Diagnosis Date  . Hypertension   . BPH (benign prostatic hyperplasia)   . Hyperlipidemia   . Cancer 2008-2009    prostate TREATED WITH RADIATION  . AAA (abdominal aortic aneurysm) 04/2012    STENTING OF AAA IN [place]  . Junctional cardiac arrhythmia     Occurred postoperatively after urologic surgery  . Severe sinus bradycardia     Occurred postoperatively after urologic surgery  . Aortic valve disorders   . Other primary cardiomyopathies    Past Surgical History  Procedure Laterality Date  . Abdominal surgery      PART OF COLON REMOVED FOR DIVERTICULITIS  . Appendectomy    . Hernia repair    . Bladder surgery  2008    FOR BLADDER STONE  . Total knee arthroplasty  1990    left  . Abdominal aortic aneurysm repair  04/2012  . Total knee arthroplasty Right 07/26/2012    Procedure: RIGHT TOTAL KNEE ARTHROPLASTY;  Surgeon: Tobi Bastos, MD;  Location: [facility];  Service: Orthopedics;  Laterality: Right;  . Nephrolithotomy Left 04/26/2013    Procedure: LEFT PERCUTANEOUS NEPHROLITHOTOMY ;  Surgeon: [name], MD;  Location: [facility];  Service: Urology;  Laterality: Left;  . Nephrolithotomy Left  05/03/2013    Procedure: 2ND STAGE LEFT PERCUTANEOUS NEPHROLITHOTOMY ;  Surgeon: [name], MD;  Location: [facility];  Service: Urology;  Laterality: Left;   Social History:  reports that he quit smoking about 28 years ago. His smoking use included Cigarettes. He started smoking about 65 years ago. He has a 74 pack-year smoking history. He has never used smokeless tobacco. He reports that he does not drink  alcohol or use illicit drugs.  Allergies  Allergen Reactions  . Atorvastatin Other (See Comments)    Causes constipation  . Penicillins Other (See Comments)    Other reaction(s): RASH HIVES     Family History  Problem Relation Age of Onset  . Lung cancer Father      Prior to Admission medications   Medication Sig Start Date End Date Taking? Authorizing Provider  acetaminophen (TYLENOL) 500 MG tablet Take 1,000 mg by mouth every 6 (six) hours as needed for mild pain or moderate pain.   Yes Historical Provider, MD  aspirin 81 MG chewable tablet Chew 81 mg by mouth daily.  10/11/13 10/11/14 Yes Historical Provider, MD  ceFEPIme 2 g in dextrose 5 % 50 mL Inject 2 g into the vein at bedtime.  01/10/14 02/26/14 Yes Historical Provider, MD  clopidogrel (PLAVIX) 75 MG tablet Take 75 mg by mouth daily. 01/10/14 01/10/15 Yes Historical Provider, MD  docusate sodium (COLACE) 100 MG capsule Take 100 mg by mouth daily as needed for mild constipation.   Yes Historical Provider, MD  doxazosin (CARDURA) 4 MG tablet Take 4 mg by mouth at bedtime.   Yes Historical Provider, MD  LORazepam (ATIVAN) 0.5 MG tablet Take 1/2 tablet by mouth every 8 hours as needed for anxiety Patient taking differently: Take 0.25 mg by mouth every 8 (eight) hours as needed for anxiety. Take 1/2 tablet by mouth every 8 hours as needed for anxiety 01/21/14  Yes Lauree Chandler, NP  megestrol (MEGACE) 400 MG/10ML suspension Take 400 mg by mouth daily.   Yes Historical Provider, MD  metoprolol succinate (TOPROL-XL) 25 MG 24 hr tablet Take 25 mg by mouth daily.   Yes Historical Provider, MD  mirtazapine (REMERON) 15 MG tablet Take 15 mg by mouth at bedtime.   Yes Historical Provider, MD  omeprazole (PRILOSEC) 40 MG capsule Take 40 mg by mouth daily.   Yes Historical Provider, MD  polyethylene glycol (MIRALAX / GLYCOLAX) packet Take 17 g by mouth daily as needed for mild constipation.    Yes Historical Provider, MD  senna (SENOKOT)  8.6 MG TABS tablet Take 2 tablets by mouth at bedtime.   Yes Historical Provider, MD  simvastatin (ZOCOR) 20 MG tablet Take 20 mg by mouth daily.   Yes Historical Provider, MD  traMADol (ULTRAM) 50 MG tablet Take 50 mg by mouth every 6 (six) hours as needed for moderate pain.   Yes Historical Provider, MD   Physical Exam: Filed Vitals:   02/06/14 1600 02/06/14 1616 02/06/14 1734 02/06/14 1835  BP: 141/91 141/88 139/79 149/83  Pulse: 86 84 76 78  Temp:    98.1 F (36.7 C)  TempSrc:    Oral  Resp:  18 18   Height:    6\' 2"  (1.88 m)  Weight:    94.121 kg (207 lb 8 oz)  SpO2: 98% 97% 100% 100%    Wt Readings from Last 3 Encounters:  02/06/14 94.121 kg (207 lb 8 oz)  01/11/14 108.863 kg (240 lb)  01/02/14 108.863 kg (240  lb)    General:  Appears calm and comfortable. He looks clinically dehydrated. He looks somewhat cachectic. Eyes: PERRL, normal lids, irises & conjunctiva ENT: grossly normal hearing, lips & tongue Neck: no LAD, masses or thyromegaly Cardiovascular: Heart sounds are normal with a fairly harsh systolic murmur indicative of possible aortic stenosis. Telemetry: SR, no arrhythmias  Respiratory: CTA bilaterally, no w/r/r. Normal respiratory effort. Abdomen: soft, ntnd Skin: no rash or induration seen on limited exam Musculoskeletal: grossly normal tone BUE/BLE Psychiatric: grossly normal mood and affect, speech fluent and appropriate Neurologic: grossly non-focal.          Labs on Admission:  Basic Metabolic Panel:  Recent Labs Lab 02/06/14 1454  NA 140  K 3.5*  CL 103  CO2 23  GLUCOSE 79  BUN 43*  CREATININE 2.03*  CALCIUM 11.1*   Liver Function Tests:  Recent Labs Lab 02/06/14 1454  AST 13  ALT <5  ALKPHOS 57  BILITOT 0.6  PROT 7.3  ALBUMIN 2.4*    Recent Labs Lab 02/06/14 1454  LIPASE 69*   No results for input(s): AMMONIA in the last 168 hours. CBC:  Recent Labs Lab 02/06/14 1454  WBC 4.8  NEUTROABS 3.6  HGB 10.1*  HCT 31.4*    MCV 82.6  PLT 72*   Cardiac Enzymes:  Recent Labs Lab 02/06/14 1454  TROPONINI <0.30    BNP (last 3 results) No results for input(s): PROBNP in the last 8760 hours. CBG:  Recent Labs Lab 02/06/14 1412  GLUCAP 75    Radiological Exams on Admission: Dg Chest Port 1 View  02/06/2014   CLINICAL DATA:  Weakness.  EXAM: PORTABLE CHEST - 1 VIEW  COMPARISON:  January 11, 2014.  FINDINGS: Stable cardiomediastinal silhouette. Stent graft is again noted projected over descending thoracic aorta. No pneumothorax or significant pleural effusion is noted. Hypoinflation of the lungs is again noted. Right-sided PICC line is again noted and unchanged. Minimal subsegmental atelectasis is noted in left lung base. Right lung is clear. Bony thorax is intact.  IMPRESSION: Hypoinflation of the lungs is noted which is unchanged compared to prior exam. Minimal left basilar subsegmental atelectasis is noted.   Electronically Signed   By: Sabino Dick M.D.   On: 02/06/2014 15:15      Assessment/Plan   1. Dehydration clinically and biochemically. 2. Hypercalcemia secondary to #1. 3. Failure to thrive with malnutrition, anorexia. 4. History of mild to moderate aortic stenosis on echocardiogram in generate 2015. 5. History of reduced ejection fraction, 40%. Patient is clinically not in heart failure. 6. History of pseudomonal infection of the graft of  thoracolumbar aortic aneurysm repair, currently on intravenous cefepime. 7. Hypertension. 8. Heart of hearing.  Plan: 1. Admit to [facility]. 2. Intravenous fluids and watch electrolytes and renal function closely. 3. Gastroenterology consultation with a view to PEG tube placement. The patient has agreed to this. 4. Continue intravenous antibiotics for pseudomonal infection.  Further recommendations will depend on patient's hospital progress.   Code Status: Full code. This was confirmed with the patient's daughter at the bedside.   DVT  Prophylaxis: SCDs.  Family Communication: Discussed the plan with the patient and patient's daughter at the bedside.   Disposition Plan: Back to the skilled nursing facility when medically stable.   Time spent: 60 minutes.  Doree Albee [facility] Pager [phone].

## 2014-02-06 NOTE — Progress Notes (Addendum)
Patient ID: [name], male   DOB: [birth year], 80 y.o.   MRN: [id number]               PROGRESS NOTE  DATE:  02/04/2014      FACILITY: [facility]    LEVEL OF CARE:   SNF   Acute Visit   CHIEF COMPLAINT:  Continued weight loss.    HISTORY OF PRESENT ILLNESS:  This is a patient who came from [facility].  He had Pseudomonas sepsis on recurrent occasions secondary to an endovascular graft placement in the abdominal aortic aneurysm in July.  He was not felt to be a candidate for open repair.  He is continuing on cefepime with no fixed stop date.  He is supposed to see his vascular surgeons later this month.     The major issue here is that he simply will not eat.  When I first saw him, he basically retches up any food even before he initiates a swallow.  He passed a swallowing test just fine and, indeed, he is taking his medications without difficulty.    LABORATORY DATA:  Lab work from today shows a hemoglobin of 9.4, platelet count 69,000, white count at 4.8.    Comprehensive metabolic panel shows an albumin of 2.3, a calcium of 10.7, a BUN of 40, creatinine at 1.8.    REVIEW OF SYSTEMS:   HEENT:  He denies dysphagia or odynophagia.    GI:  No abdominal pain or diarrhea.   GU:  No dysuria.   CHEST/RESPIRATORY:  No cough.  No sputum.    PHYSICAL EXAMINATION:   GENERAL APPEARANCE:  The patient continues to look like he is losing weight.  Indeed, he is down today to 208.5 pounds.  This is a drop from 217.8 on 01/04/2014.   HEENT:   MOUTH/THROAT:   His throat looked irritated last week.  Unfortunately, today it is covered by what I think is barium although I wonder whether some of this could be thrush.   CHEST/RESPIRATORY:  Clear air entry bilaterally.   CARDIOVASCULAR:  CARDIAC:   Heart sounds are normal.  He does look somewhat volume-contracted.   GASTROINTESTINAL:  ABDOMEN:   Soft.   LIVER/SPLEEN/KIDNEYS:  No liver, no spleen.  No tenderness.     ASSESSMENT/PLAN:     Weight loss.  This continues.  The source if this is not  clear.    Thrombocytopenia.  I suspect this is the cefepime itself.  He does not appear to be septic.  There is no other obvious source of this.    Extreme anorexia.  This has really defied anything that I have thought about, although it was also noted before he came here.  The family and the patient also have consented to a feeding tube if this is necessary.  Apparently, his oral appetite has picked up over the weekend.   Therefore, I will continue to wait and see if this will reverse.  I have recently become aware that Tolterodine, which is a medication that he was put on a while back for bladder, occasionally causes dyspepsia.  Therefore, I am going to put this on hold.    Dehydration.  Once again, I think his BUN and creatinine are probably prerenal.  I have talked to him about oral intake.

## 2014-02-07 ENCOUNTER — Encounter: Payer: Self-pay | Admitting: Internal Medicine

## 2014-02-07 LAB — COMPREHENSIVE METABOLIC PANEL
ALBUMIN: 2.1 g/dL — AB (ref 3.5–5.2)
ALT: <5 U/L (ref 0–53)
ANION GAP: 12 (ref 5–15)
AST: 21 U/L (ref 0–37)
Alkaline Phosphatase: 50 U/L (ref 39–117)
BUN: 40 mg/dL — AB (ref 6–23)
CO2: 23 mEq/L (ref 19–32)
CREATININE: 1.84 mg/dL — AB (ref 0.50–1.35)
Calcium: 10.2 mg/dL (ref 8.4–10.5)
Chloride: 109 mEq/L (ref 96–112)
GFR calc Af Amer: 38 mL/min — ABNORMAL LOW (ref >90)
GFR calc non Af Amer: 32 mL/min — ABNORMAL LOW (ref >90)
Glucose, Bld: 75 mg/dL (ref 70–99)
Potassium: 3.7 mEq/L (ref 3.7–5.3)
Sodium: 144 mEq/L (ref 137–147)
TOTAL PROTEIN: 6.4 g/dL (ref 6.0–8.3)
Total Bilirubin: 0.4 mg/dL (ref 0.3–1.2)

## 2014-02-07 LAB — BASIC METABOLIC PANEL
ANION GAP: 11 (ref 5–15)
BUN: 39 mg/dL — AB (ref 6–23)
CHLORIDE: 108 meq/L (ref 96–112)
CO2: 23 meq/L (ref 19–32)
Calcium: 10.1 mg/dL (ref 8.4–10.5)
Creatinine, Ser: 1.78 mg/dL — ABNORMAL HIGH (ref 0.50–1.35)
GFR calc Af Amer: 39 mL/min — ABNORMAL LOW (ref >90)
GFR calc non Af Amer: 34 mL/min — ABNORMAL LOW (ref >90)
Glucose, Bld: 89 mg/dL (ref 70–99)
Potassium: 4 mEq/L (ref 3.7–5.3)
Sodium: 142 mEq/L (ref 137–147)

## 2014-02-07 LAB — CBC
HEMATOCRIT: 28.9 % — AB (ref 39.0–52.0)
Hemoglobin: 9.2 g/dL — ABNORMAL LOW (ref 13.0–17.0)
MCH: 26.7 pg (ref 26.0–34.0)
MCHC: 31.8 g/dL (ref 30.0–36.0)
MCV: 83.8 fL (ref 78.0–100.0)
Platelets: 65 10*3/uL — ABNORMAL LOW (ref 150–400)
RBC: 3.45 MIL/uL — AB (ref 4.22–5.81)
RDW: 19.5 % — ABNORMAL HIGH (ref 11.5–15.5)
WBC: 3.8 10*3/uL — AB (ref 4.0–10.5)

## 2014-02-07 LAB — TSH: TSH: 3 u[IU]/mL (ref 0.350–4.500)

## 2014-02-07 NOTE — Consult Note (Signed)
Referring Provider: No ref. provider found Primary Care Physician:  [name], MD Primary Gastroenterologist:  Dr.Soyla Bainter  Reason for Consultation:  Failure to thrive; needs PEG placement   History of present illness:  80 year old male [facility] patient admitted to the hospital last evening with progressive failure to thrive, difficulty taking oral nutrition along with dehydration.. The patient is significantly hard of hearing without his hearing aids. Much of the history provided by his daughter [name] [phone])  this morning. Over the past few months patient has progressively had more and more difficulty not taking in any of his meals. Apparently, he attempts to swallow food may go down to some degree but then he vomits it up. Interestingly, he does okay with liquids and is able to get his medications daily. Hasn't really had any apparent recent abdominal pain;   bowel function is not as good as previously.  Tendency now towards constipation or simply decreased bowel function with diminished oral intake. No reported hematochezia, melena or hematemesis.  Recent limited swallowing examination done here which revealed gross aspiration-exam terminated.  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  Stent placement complicated by Pseudomonas stent infection requiring ongoing antibiotic treatment.  I reviewed the CT with Dr. Reesa Chew this morning. These vascular changes are significantly posterior to the stomach and there does not appear to be any structural/vascular hazards between the anterior stomach and the abdominal wall.  Patient has been taking Plavix up until yesterday.  According to family members, no significant GI illnesses in the past other complicated diverticulitis requiring a segmental resection of his colon and temporary colostomy. He does take a  PPI daily. Otherwise,  unsure about prior colonoscopy or prior gastrointestinal illness. I note the patient saw Dr. Jim Desanctis back in 2004 but details of that encounter are not known. No family history of GI neoplasia.  Platelet count 65,000 this morning.    Past Medical History  Diagnosis Date  . Hypertension   . BPH (benign prostatic hyperplasia)   . Hyperlipidemia   . Cancer 2008-2009    prostate TREATED WITH RADIATION  . AAA (abdominal aortic aneurysm) 04/2012    STENTING OF AAA IN [place]  . Junctional cardiac arrhythmia     Occurred postoperatively after urologic surgery  . Severe sinus bradycardia     Occurred postoperatively after urologic surgery  . Aortic valve disorders   . Other primary cardiomyopathies     Past Surgical History  Procedure Laterality Date  . Abdominal surgery      PART OF COLON REMOVED FOR DIVERTICULITIS  . Appendectomy    . Hernia repair    . Bladder surgery  2008    FOR BLADDER STONE  . Total knee arthroplasty  1990    left  . Abdominal aortic aneurysm repair  04/2012  . Total knee arthroplasty Right 07/26/2012    Procedure: RIGHT TOTAL KNEE ARTHROPLASTY;  Surgeon: Tobi Bastos, MD;  Location: [facility];  Service: Orthopedics;  Laterality: Right;  . Nephrolithotomy Left 04/26/2013    Procedure: LEFT PERCUTANEOUS NEPHROLITHOTOMY ;  Surgeon: [name], MD;  Location: [facility];  Service: Urology;  Laterality: Left;  . Nephrolithotomy Left 05/03/2013    Procedure: 2ND STAGE LEFT PERCUTANEOUS NEPHROLITHOTOMY ;  Surgeon: [name], MD;  Location: [facility];  Service: Urology;  Laterality: Left;    Prior to Admission medications   Medication  Sig Start Date End Date Taking? Authorizing Provider  acetaminophen (TYLENOL) 500 MG tablet Take 1,000 mg by mouth every 6 (six) hours as needed for mild pain or moderate pain.   Yes Historical Provider, MD  aspirin 81 MG chewable tablet Chew 81 mg by mouth daily.  10/11/13 10/11/14 Yes Historical Provider, MD    ceFEPIme 2 g in dextrose 5 % 50 mL Inject 2 g into the vein at bedtime.  01/10/14 02/26/14 Yes Historical Provider, MD  clopidogrel (PLAVIX) 75 MG tablet Take 75 mg by mouth daily. 01/10/14 01/10/15 Yes Historical Provider, MD  docusate sodium (COLACE) 100 MG capsule Take 100 mg by mouth daily as needed for mild constipation.   Yes Historical Provider, MD  doxazosin (CARDURA) 4 MG tablet Take 4 mg by mouth at bedtime.   Yes Historical Provider, MD  LORazepam (ATIVAN) 0.5 MG tablet Take 1/2 tablet by mouth every 8 hours as needed for anxiety Patient taking differently: Take 0.25 mg by mouth every 8 (eight) hours as needed for anxiety. Take 1/2 tablet by mouth every 8 hours as needed for anxiety 01/21/14  Yes Lauree Chandler, NP  megestrol (MEGACE) 400 MG/10ML suspension Take 400 mg by mouth daily.   Yes Historical Provider, MD  metoprolol succinate (TOPROL-XL) 25 MG 24 hr tablet Take 25 mg by mouth daily.   Yes Historical Provider, MD  mirtazapine (REMERON) 15 MG tablet Take 15 mg by mouth at bedtime.   Yes Historical Provider, MD  omeprazole (PRILOSEC) 40 MG capsule Take 40 mg by mouth daily.   Yes Historical Provider, MD  polyethylene glycol (MIRALAX / GLYCOLAX) packet Take 17 g by mouth daily as needed for mild constipation.    Yes Historical Provider, MD  senna (SENOKOT) 8.6 MG TABS tablet Take 2 tablets by mouth at bedtime.   Yes Historical Provider, MD  simvastatin (ZOCOR) 20 MG tablet Take 20 mg by mouth daily.   Yes Historical Provider, MD  traMADol (ULTRAM) 50 MG tablet Take 50 mg by mouth every 6 (six) hours as needed for moderate pain.   Yes Historical Provider, MD    Current Facility-Administered Medications  Medication Dose Route Frequency Provider Last Rate Last Dose  . 0.9 %  sodium chloride infusion   Intravenous Continuous Doree Albee, MD 125 mL/hr at 02/07/14 0431    . acetaminophen (TYLENOL) tablet 1,000 mg  1,000 mg Oral Q6H PRN Nimish C Gosrani, MD      . aspirin  chewable tablet 81 mg  81 mg Oral Daily Nimish C Anastasio Champion, MD   81 mg at 02/07/14 0957  . ceFEPIme (MAXIPIME) 2 g in dextrose 5 % 50 mL IVPB  2 g Intravenous Q24H Doree Albee, MD   2 g at 02/06/14 2132  . clopidogrel (PLAVIX) tablet 75 mg  75 mg Oral Daily Doree Albee, MD   75 mg at 02/07/14 0957  . docusate sodium (COLACE) capsule 100 mg  100 mg Oral Daily PRN Nimish C Gosrani, MD      . doxazosin (CARDURA) tablet 4 mg  4 mg Oral QHS Doree Albee, MD   4 mg at 02/06/14 2133  . LORazepam (ATIVAN) tablet 0.25 mg  0.25 mg Oral Q8H PRN Nimish C Gosrani, MD      . megestrol (MEGACE) 400 MG/10ML suspension 400 mg  400 mg Oral Daily Nimish C Anastasio Champion, MD   400 mg at 02/07/14 0957  . metoprolol succinate (TOPROL-XL) 24 hr tablet 25 mg  25 mg Oral Daily Doree Albee, MD   25 mg at 02/07/14 0958  . mirtazapine (REMERON) tablet 15 mg  15 mg Oral QHS Doree Albee, MD   15 mg at 02/06/14 2134  . ondansetron (ZOFRAN) tablet 4 mg  4 mg Oral Q6H PRN Nimish C Gosrani, MD       Or  . ondansetron (ZOFRAN) injection 4 mg  4 mg Intravenous Q6H PRN Nimish C Gosrani, MD      . pantoprazole (PROTONIX) EC tablet 40 mg  40 mg Oral Daily Nimish C Anastasio Champion, MD   40 mg at 02/07/14 0957  . polyethylene glycol (MIRALAX / GLYCOLAX) packet 17 g  17 g Oral Daily PRN Nimish Luther Parody, MD      . senna (SENOKOT) tablet 17.2 mg  2 tablet Oral QHS Doree Albee, MD   17.2 mg at 02/06/14 2133  . simvastatin (ZOCOR) tablet 20 mg  20 mg Oral Daily Nimish C Anastasio Champion, MD   20 mg at 02/07/14 0957  . sodium chloride 0.9 % injection 3 mL  3 mL Intravenous Q12H Nimish C Gosrani, MD   3 mL at 02/06/14 2200  . traMADol (ULTRAM) tablet 50 mg  50 mg Oral Q6H PRN Doree Albee, MD        Allergies as of 02/06/2014 - Review Complete 02/06/2014  Allergen Reaction Noted  . Atorvastatin Other (See Comments) 01/11/2014  . Penicillins Other (See Comments) 11/09/2010    Family History  Problem Relation Age of Onset  . Lung  cancer Father     History   Social History  . Marital Status: Married    Spouse Name: N/A    Number of Children: N/A  . Years of Education: N/A   Occupational History  . Not on file.   Social History Main Topics  . Smoking status: Former Smoker -- 2.00 packs/day for 37 years    Types: Cigarettes    Start date: 02/17/1949    Quit date: 03/15/1985  . Smokeless tobacco: Never Used     Comment: quit 25 years ago  . Alcohol Use: No  . Drug Use: No  . Sexual Activity: No   Other Topics Concern  . Not on file   Social History Narrative    Review of Systems: Gen: Denies any fever, chills, sweatsr CV: Denies chest pain, angina, palpitations, syncope, orthopnea, PND, peripheral edema, and claudication. Resp: Denies dyspnea at rest, dyspnea with exercise, cough, sputum, wheezing, coughing up blood, and pleurisy. GI: Denies vomiting blood, jaundice, and fecal incontinence.   Derm: Denies rash, itching, dry skin, hives, moles, warts, or unhealing ulcers.  Psych: Denies depression, anxiety, memory loss, suicidal ideation, hallucinations, paranoia, and confusion. Heme: Denies bruising, bleeding, and enlarged lymph nodes.   Physical Exam: Vital signs in last 24 hours: Temp:  [97.6 F (36.4 C)-98.1 F (36.7 C)] 98 F (36.7 C) (11/26 0651) Pulse Rate:  [76-96] 86 (11/26 0651) Resp:  [15-29] 20 (11/26 0651) BP: (97-159)/(59-91) 159/86 mmHg (11/26 0651) SpO2:  [96 %-100 %] 100 % (11/26 0651) Weight:  [207 lb 8 oz (94.121 kg)-208 lb (94.348 kg)] 207 lb 8 oz (94.121 kg) (11/25 1835) Last BM Date: 02/05/14 General:  Elderly alert, somewhat hard of hearing gentleman who appears pleasant and in no acute distress. He is accompanied by multiple family members.  Head:  Normocephalic and atraumatic. Eyes:  Sclera clear, no icterus.   Conjunctiva pink. Ears:  Normal auditory acuity. Nose:  No deformity,  discharge,  or lesions. Mouth:  No deformity or lesions, dentition normal. Neck:   Supple; no masses or thyromegaly. Lungs:  Clear throughout to auscultation.   No wheezes, crackles, or rhonchi. No acute distress. Heart:  Regular rate and rhythm; no murmurs, clicks, rubs,  or gallops. Abdomen: Nondistended. Has some infraumbilical laparotomy scar and an apparent right lower quadrant colostomy scar. He has good bowel sounds and no bruits appreciated. Abdomen is entirely soft and nontender without mass or organomegaly appreciated.   Intake/Output from previous day: 11/25 0701 - 11/26 0700 In: 783.3 [I.V.:733.3; IV Piggyback:50] Out: 500 [Urine:500] Intake/Output this shift:    Lab Results:  Recent Labs  02/06/14 1454 02/07/14 0520  WBC 4.8 3.8*  HGB 10.1* 9.2*  HCT 31.4* 28.9*  PLT 72* 65*   BMET  Recent Labs  02/06/14 1454 02/07/14 0520  NA 140 144  K 3.5* 3.7  CL 103 109  CO2 23 23  GLUCOSE 79 75  BUN 43* 40*  CREATININE 2.03* 1.84*  CALCIUM 11.1* 10.2   LFT  Recent Labs  02/07/14 0520  PROT 6.4  ALBUMIN 2.1*  AST 21  ALT <5  ALKPHOS 50  BILITOT 0.4    Studies/Results: Dg Chest Port 1 View  02/06/2014   CLINICAL DATA:  Weakness.  EXAM: PORTABLE CHEST - 1 VIEW  COMPARISON:  January 11, 2014.  FINDINGS: Stable cardiomediastinal silhouette. Stent graft is again noted projected over descending thoracic aorta. No pneumothorax or significant pleural effusion is noted. Hypoinflation of the lungs is again noted. Right-sided PICC line is again noted and unchanged. Minimal subsegmental atelectasis is noted in left lung base. Right lung is clear. Bony thorax is intact.  IMPRESSION: Hypoinflation of the lungs is noted which is unchanged compared to prior exam. Minimal left basilar subsegmental atelectasis is noted.   Electronically Signed   By: Sabino Dick M.D.   On: 02/06/2014 15:15   Impression / Recommendations: 80 year old gentleman nursing home resident admitted to the hospital with a several month history of progressive failure to  thrive-diminished oral intake and secondary dehydration. Interestingly, he's been able to swallow his medications without difficulty by report. He does have gross aspiration on a recent swallowing study.  Recent significant vascular intervention with findings noted on recent abdominal CT. Platelet count diminished-cause uncertain but may possibly be related to Plavix. No evidence of advanced chronic liver disease on recent cross-sectional imaging.  From a technical standpoint, I feel that we could place a PEG tube. I've discussed the procedure along with potential risks of bleeding and perforation and reaction to medications with patient and multiple family members. Patient now has his hearing aids and seems to understand. All parties are interested. I specifically explained that PEG placement would not reduce the risk of aspiration or necessarily improve any of his other medical conditions. I also discussed the need to be off of Plavix for 5 days following the procedure. His platelet count will need to be rechecked.  We'll tentatively plan for PEG tube placement the first of next week. In the interim, would hold Plavix. Consider Dobbhoff tube placement for temporary nutritional support.   Notice:  This dictation was prepared with Dragon dictation along with smaller phrase technology. Any transcriptional errors that result from this process are unintentional and may not be corrected upon review.

## 2014-02-07 NOTE — Plan of Care (Signed)
Problem: Consults Goal: General Medical Patient Education See Patient Education Module for specific education.  Outcome: Completed/Met Date Met:  02/07/14 Patient and family Goal: Skin Care Protocol Initiated - if Braden Score 18 or less If consults are not indicated, leave blank or document N/A  Outcome: Not Applicable Date Met:  16/10/96 Goal: Nutrition Consult-if indicated Outcome: Progressing Goal: Diabetes Guidelines if Diabetic/Glucose > 140 If diabetic or lab glucose is > 140 mg/dl - Initiate Diabetes/Hyperglycemia Guidelines & Document Interventions  Outcome: Not Applicable Date Met:  04/54/09

## 2014-02-07 NOTE — Progress Notes (Signed)
[facility] PROGRESS NOTE  [name]:[id number] DOB: [birth year] DOA: 02/06/2014 PCP: [name], MD  Assessment/Plan:    FTT (failure to thrive) in adult - Will obtain speech therapy evaluation - GI consulted for PEG tube placement - Agree with holding Plavix for PEG tube placement  Active Problems:   Abdominal aortic aneurysm  - stable currently - Stent placement complicated by Pseudomonas stent infection requiring ongoing antibiotic treatment.    HTN (hypertension)    Thrombocytopenia - Unsure of etiology    Dehydration - Improving with IV fluid rehydration. Will decrease IV rate today.  Code Status: full Family Communication:  No family at bedside Disposition Plan: Peg tube placement most likey 02/11/14   Consultants:  GI: Dr. Gala Romney  Procedures:  none  Antibiotics:  cefepime  HPI/Subjective: Pt has no new complaints. Still denies any oral intake  Objective: Filed Vitals:   02/07/14 0651  BP: 159/86  Pulse: 86  Temp: 98 F (36.7 C)  Resp: 20    Intake/Output Summary (Last 24 hours) at 02/07/14 1349 Last data filed at 02/07/14 2426  Gross per 24 hour  Intake 783.33 ml  Output    500 ml  Net 283.33 ml   Filed Weights   02/06/14 1414 02/06/14 1835  Weight: 94.348 kg (208 lb) 94.121 kg (207 lb 8 oz)    Exam:   General:  Pt in nad, alert and awake  Cardiovascular: rrr, no mrg  Respiratory: cta bl, no wheezes  Abdomen: soft, ND  Musculoskeletal: no cyanosis or clubbing   Data Reviewed: Basic Metabolic Panel:  Recent Labs Lab 02/06/14 1454 02/07/14 0520 02/07/14 1218  NA 140 144 142  K 3.5* 3.7 4.0  CL 103 109 108  CO2 23 23 23   GLUCOSE 79 75 89  BUN 43* 40* 39*  CREATININE 2.03* 1.84* 1.78*  CALCIUM 11.1* 10.2 10.1   Liver Function Tests:  Recent Labs Lab 02/06/14 1454 02/07/14 0520  AST 13 21  ALT <5 <5  ALKPHOS 57 50  BILITOT 0.6 0.4  PROT 7.3 6.4  ALBUMIN 2.4* 2.1*    Recent Labs Lab  02/06/14 1454  LIPASE 69*   No results for input(s): AMMONIA in the last 168 hours. CBC:  Recent Labs Lab 02/06/14 1454 02/07/14 0520  WBC 4.8 3.8*  NEUTROABS 3.6  --   HGB 10.1* 9.2*  HCT 31.4* 28.9*  MCV 82.6 83.8  PLT 72* 65*   Cardiac Enzymes:  Recent Labs Lab 02/06/14 1454  TROPONINI <0.30   BNP (last 3 results) No results for input(s): PROBNP in the last 8760 hours. CBG:  Recent Labs Lab 02/06/14 1412 02/06/14 2039  GLUCAP 75 78    No results found for this or any previous visit (from the past 240 hour(s)).   Studies: Dg Chest Port 1 View  02/06/2014   CLINICAL DATA:  Weakness.  EXAM: PORTABLE CHEST - 1 VIEW  COMPARISON:  January 11, 2014.  FINDINGS: Stable cardiomediastinal silhouette. Stent graft is again noted projected over descending thoracic aorta. No pneumothorax or significant pleural effusion is noted. Hypoinflation of the lungs is again noted. Right-sided PICC line is again noted and unchanged. Minimal subsegmental atelectasis is noted in left lung base. Right lung is clear. Bony thorax is intact.  IMPRESSION: Hypoinflation of the lungs is noted which is unchanged compared to prior exam. Minimal left basilar subsegmental atelectasis is noted.   Electronically Signed   By: Sabino Dick M.D.   On: 02/06/2014 15:15  Scheduled Meds: . aspirin  81 mg Oral Daily  . ceFEPime (MAXIPIME) IV  2 g Intravenous Q24H  . doxazosin  4 mg Oral QHS  . megestrol  400 mg Oral Daily  . metoprolol succinate  25 mg Oral Daily  . mirtazapine  15 mg Oral QHS  . pantoprazole  40 mg Oral Daily  . senna  2 tablet Oral QHS  . simvastatin  20 mg Oral Daily  . sodium chloride  3 mL Intravenous Q12H   Continuous Infusions: . sodium chloride 75 mL/hr at 02/07/14 1242     Time spent: > 35    Haedyn Breau, [facility] Pager [phone] If 7PM-7AM, please contact night-coverage at [email or web address], password [facility] 02/07/2014, 1:49 PM  LOS: 1 day

## 2014-02-07 NOTE — Plan of Care (Signed)
Problem: Phase I Progression Outcomes Goal: Pain controlled with appropriate interventions Outcome: Progressing Goal: OOB as tolerated unless otherwise ordered Outcome: Progressing Goal: Initial discharge plan identified Outcome: Progressing Goal: Hemodynamically stable Outcome: Progressing Goal: Other Phase I Outcomes/Goals Outcome: Progressing

## 2014-02-08 ENCOUNTER — Inpatient Hospital Stay (HOSPITAL_COMMUNITY): Payer: Medicare Other

## 2014-02-08 DIAGNOSIS — E43 Unspecified severe protein-calorie malnutrition: Secondary | ICD-10-CM

## 2014-02-08 LAB — URINE CULTURE
COLONY COUNT: NO GROWTH
Culture: NO GROWTH

## 2014-02-08 LAB — PHOSPHORUS: Phosphorus: 2.2 mg/dL — ABNORMAL LOW (ref 2.3–4.6)

## 2014-02-08 MED ORDER — JEVITY 1.2 CAL PO LIQD
1000.0000 mL | ORAL | Status: DC
  Administered 2014-02-08: 1000 mL
  Filled 2014-02-08 (×6): qty 1000

## 2014-02-08 NOTE — Progress Notes (Signed)
Pt is quite anxious about upcoming feeding tube placement.  He is unwilling to participate in a PT eval due to anxiety.  We will try to do this on Monday...apparently he will be returning to the [facility].

## 2014-02-08 NOTE — Progress Notes (Signed)
Pt complained that "he did not feel right", obtained a stat vitals and EKG and notified Dr on call who reviewed EKG and stated that the pt was in NSR and that the EKG showed nothing of immediate concern. Vital signs were WNL for that of the pt. Will continue to monitor.

## 2014-02-08 NOTE — Care Management Utilization Note (Signed)
UR completed 

## 2014-02-08 NOTE — Progress Notes (Signed)
[facility] PROGRESS NOTE  [name] STM:[id number] DOB: 04-06-1929 DOA: 02/06/2014 PCP: [name], MD  Assessment/Plan:    FTT (failure to thrive) in adult - Will obtain speech therapy evaluation - GI consulted for PEG tube placement, since waiting until Monday. We'll recommend Dobbhoff placement as such will consult IR. - Agree with holding Plavix for PEG tube placement  Severe malnutrition - Pt meets criteria for severe MALNUTRITION in the context of chronic illness as evidenced by wt loss of 12% in 30 days and 26% in < 1 year with energy intake </= 75% for >/=1 month - Will consult IR for Dobboff placement. - liberalize diet to regular diet. - RD on board. - assess phosphorus levels  Active Problems:   Abdominal aortic aneurysm  - stable currently - Stent placement complicated by Pseudomonas stent infection requiring ongoing antibiotic treatment.    HTN (hypertension)    Thrombocytopenia - Unsure of etiology    Dehydration - Improving with IV fluid rehydration. Will decrease IV rate today.  Code Status: full Family Communication:  No family at bedside Disposition Plan: Peg tube placement most likey 02/11/14   Consultants:  GI: Dr. Gala Romney  Procedures:  none  Antibiotics:  cefepime  HPI/Subjective: Pt has no new complaints. Still denies any oral intake  Objective: Filed Vitals:   02/08/14 0346  BP: 152/70  Pulse: 56  Temp: 97.7 F (36.5 C)  Resp: 18    Intake/Output Summary (Last 24 hours) at 02/08/14 1140 Last data filed at 02/08/14 0928  Gross per 24 hour  Intake   1485 ml  Output    600 ml  Net    885 ml   Filed Weights   02/06/14 1414 02/06/14 1835 02/08/14 0530  Weight: 94.348 kg (208 lb) 94.121 kg (207 lb 8 oz) 96.208 kg (212 lb 1.6 oz)    Exam:   General:  Pt in nad, alert and awake  Cardiovascular: rrr, no mrg  Respiratory: cta bl, no wheezes  Abdomen: soft, ND  Musculoskeletal: no cyanosis or clubbing    Data Reviewed: Basic Metabolic Panel:  Recent Labs Lab 02/06/14 1454 02/07/14 0520 02/07/14 1218  NA 140 144 142  K 3.5* 3.7 4.0  CL 103 109 108  CO2 23 23 23   GLUCOSE 79 75 89  BUN 43* 40* 39*  CREATININE 2.03* 1.84* 1.78*  CALCIUM 11.1* 10.2 10.1   Liver Function Tests:  Recent Labs Lab 02/06/14 1454 02/07/14 0520  AST 13 21  ALT <5 <5  ALKPHOS 57 50  BILITOT 0.6 0.4  PROT 7.3 6.4  ALBUMIN 2.4* 2.1*    Recent Labs Lab 02/06/14 1454  LIPASE 69*   No results for input(s): AMMONIA in the last 168 hours. CBC:  Recent Labs Lab 02/06/14 1454 02/07/14 0520  WBC 4.8 3.8*  NEUTROABS 3.6  --   HGB 10.1* 9.2*  HCT 31.4* 28.9*  MCV 82.6 83.8  PLT 72* 65*   Cardiac Enzymes:  Recent Labs Lab 02/06/14 1454  TROPONINI <0.30   BNP (last 3 results) No results for input(s): PROBNP in the last 8760 hours. CBG:  Recent Labs Lab 02/06/14 1412 02/06/14 2039  GLUCAP 75 78    Recent Results (from the past 240 hour(s))  Urine culture     Status: None   Collection Time: 02/06/14  2:55 PM  Result Value Ref Range Status   Specimen Description URINE, CATHETERIZED  Final   Special Requests NONE  Final   Culture  Setup Time  Final    02/06/2014 22:59 Performed at [place] Performed at [facility]   Final   Culture NO GROWTH Performed at [facility]   Final   Report Status 02/08/2014 FINAL  Final     Studies: Dg Chest Port 1 View  02/06/2014   CLINICAL DATA:  Weakness.  EXAM: PORTABLE CHEST - 1 VIEW  COMPARISON:  January 11, 2014.  FINDINGS: Stable cardiomediastinal silhouette. Stent graft is again noted projected over descending thoracic aorta. No pneumothorax or significant pleural effusion is noted. Hypoinflation of the lungs is again noted. Right-sided PICC line is again noted and unchanged. Minimal subsegmental atelectasis is noted in left lung base. Right lung is clear. Bony thorax is intact.   IMPRESSION: Hypoinflation of the lungs is noted which is unchanged compared to prior exam. Minimal left basilar subsegmental atelectasis is noted.   Electronically Signed   By: Sabino Dick M.D.   On: 02/06/2014 15:15    Scheduled Meds: . aspirin  81 mg Oral Daily  . ceFEPime (MAXIPIME) IV  2 g Intravenous Q24H  . doxazosin  4 mg Oral QHS  . megestrol  400 mg Oral Daily  . metoprolol succinate  25 mg Oral Daily  . mirtazapine  15 mg Oral QHS  . pantoprazole  40 mg Oral Daily  . senna  2 tablet Oral QHS  . simvastatin  20 mg Oral Daily  . sodium chloride  3 mL Intravenous Q12H   Continuous Infusions: . sodium chloride 75 mL/hr at 02/07/14 1242     Time spent: > 35    Rye Decoste, [place] [facility] Pager [phone] If 7PM-7AM, please contact night-coverage at [email or web address], password [facility] 02/08/2014, 11:40 AM  LOS: 2 days

## 2014-02-08 NOTE — Clinical Social Work Psychosocial (Signed)
Clinical Social Work Department BRIEF PSYCHOSOCIAL ASSESSMENT 02/08/2014  Patient:  [name], [name]     Account Number:  [id number]     Admit date:  02/06/2014  Clinical Social Worker:  Daiva Huge  Date/Time:  02/08/2014 11:53 AM  Referred by:  Physician  Date Referred:  02/08/2014 Referred for  SNF Placement   Other Referral:   Interview type:  Patient Other interview type:    PSYCHOSOCIAL DATA Living Status:  FACILITY Admitted from facility:  [facility] Level of care:  [place] Primary support name:  3 DAUGHTERS AND 1 SON Primary support relationship to patient:  FAMILY Degree of support available:   GOOD    CURRENT CONCERNS Current Concerns  Post-Acute Placement   Other Concerns:    SOCIAL WORK ASSESSMENT / PLAN Met with patient who reports he has been residing at [facility] and plans to return there at d/c. "theyre going to put a feeding tube in my stomach". He plans to return to SNF bed at [facility] when released- per report, PEG to be placed on Monday- CSW has updated SNF rep and has updated FL2 for return.   Assessment/plan status:  Other - See comment Other assessment/ plan:   update FL2   Information/referral to community resources:    PATIENT'S/FAMILY'S RESPONSE TO PLAN OF CARE: Patient oriented and appropriate during CSW visit- plan for return to SNF once medically cleared (PEG pending).       Eduard Clos, MSW, [place]

## 2014-02-08 NOTE — Plan of Care (Signed)
Problem: Phase III Progression Outcomes Goal: Foley discontinued Outcome: Not Applicable Date Met:  28/20/81

## 2014-02-08 NOTE — Progress Notes (Signed)
Notified MD that patient has not had any urine output for the shift. Bladder scan revealed 224mls. Patient states that he does not feel like he needs to go right now. MD ordered for patient to be bladder scanned at approximately 6pm tonight and if >344ml to in and out cath patient. Will encourage patient to void at this time.

## 2014-02-08 NOTE — Progress Notes (Signed)
INITIAL NUTRITION ASSESSMENT  DOCUMENTATION CODES Per approved criteria  -Severe malnutrition in the context of chronic illness   Pt meets criteria for severe MALNUTRITION in the context of chronic illness as evidenced by wt loss of 12% in 30 days and 26% in < 1 year with energy intake </= 75% for >/=1 month  INTERVENTION:  Regular diet as tolerated  Recommend if/when NGT/PEG tube placed: Initate Jevity 1.2 @ 20 ml/hr via NGT/PEG and increase by 10 ml every 8 hours to goal rate of 7ml/hr.   Add 30 ml Prostat TID   Tube feeding regimen provides 2028 kcal (100% of estimated needs), 125 grams of protein, and 1162 ml of H2O.  Monitor magnesium, potassium, and phosphorus daily for at least 3 days, MD to replete as needed, as pt is at risk for refeeding syndrome given his prolonged hypocaloric intake    NUTRITION DIAGNOSIS: Inadequate oral intake related to anorexia as evidenced by 0-25% po intake past 17 days and 12% wt loss in 30 days  Goal: Pt to meet >/= 90% of their estimated nutrition needs    Monitor: nutrition support, labs and wt trends    Reason for Assessment: evaluate nutrition status  80 y.o. male   ASSESSMENT: pt from [facility]. Hx poor oral intake (0-25% of meals past 17 days)  and severe weight loss over the past year (26% since February and 12% in the past month). Pt receiving Megace for appetite. Also receiving Remeron.   He presents with syncope and significant dehydration seeking PEG tube placement. Per MD note PEG tube placement pending for next week. Agree with  Dobbhoff placement given pt severely malnourished state.  Moderate Fat mass depletion to upper arms and orbital regions and mild-moderate temporal and clavicle wasting noted.    IVF-NS @ 75 ml/hr Abnormal Labs: BUN and Creat. trending down  Height: Ht Readings from Last 1 Encounters:  02/06/14 6\' 2"  (1.88 m)    Weight: Wt Readings from Last 1 Encounters:  02/08/14 212 lb 1.6 oz (96.208 kg)     Ideal Body Weight: 190# (86 kg)  % Ideal Body Weight: 112%  Wt Readings from Last 10 Encounters:  02/08/14 212 lb 1.6 oz (96.208 kg)  01/11/14 240 lb (108.863 kg)  01/02/14 240 lb (108.863 kg)  10/31/13 256 lb (116.121 kg)  08/03/13 280 lb 12.8 oz (127.37 kg)  07/20/13 277 lb (125.646 kg)  07/05/13 265 lb 14.4 oz (120.611 kg)  06/29/13 277 lb (125.646 kg)  05/18/13 259 lb (117.482 kg)  05/18/13 260 lb (117.935 kg)    Usual Body Weight: 286# (in February per daughter)  % Usual Body Weight: 74%  BMI:  Body mass index is 27.22 kg/(m^2). overweight  Estimated Nutritional Needs: Kcal: 9326-7124 Protein: 120-140 gr Fluid: 1.8-2.2 liters daily  Skin: intact  Diet Order: Diet regular  EDUCATION NEEDS: -No education needs identified at this time   Intake/Output Summary (Last 24 hours) at 02/08/14 0850 Last data filed at 02/08/14 0350  Gross per 24 hour  Intake   1125 ml  Output    600 ml  Net    525 ml    Last BM: 11/24   Labs:   Recent Labs Lab 02/06/14 1454 02/07/14 0520 02/07/14 1218  NA 140 144 142  K 3.5* 3.7 4.0  CL 103 109 108  CO2 23 23 23   BUN 43* 40* 39*  CREATININE 2.03* 1.84* 1.78*  CALCIUM 11.1* 10.2 10.1  GLUCOSE 79 75 89  CBG (last 3)   Recent Labs  02/06/14 1412 02/06/14 2039  GLUCAP 75 78    Scheduled Meds: . aspirin  81 mg Oral Daily  . ceFEPime (MAXIPIME) IV  2 g Intravenous Q24H  . doxazosin  4 mg Oral QHS  . megestrol  400 mg Oral Daily  . metoprolol succinate  25 mg Oral Daily  . mirtazapine  15 mg Oral QHS  . pantoprazole  40 mg Oral Daily  . senna  2 tablet Oral QHS  . simvastatin  20 mg Oral Daily  . sodium chloride  3 mL Intravenous Q12H    Continuous Infusions: . sodium chloride 75 mL/hr at 02/07/14 1242    Past Medical History  Diagnosis Date  . Hypertension   . BPH (benign prostatic hyperplasia)   . Hyperlipidemia   . Cancer 2008-2009    prostate TREATED WITH RADIATION  . AAA (abdominal aortic  aneurysm) 04/2012    STENTING OF AAA IN [place]  . Junctional cardiac arrhythmia     Occurred postoperatively after urologic surgery  . Severe sinus bradycardia     Occurred postoperatively after urologic surgery  . Aortic valve disorders   . Other primary cardiomyopathies     Past Surgical History  Procedure Laterality Date  . Abdominal surgery      PART OF COLON REMOVED FOR DIVERTICULITIS  . Appendectomy    . Hernia repair    . Bladder surgery  2008    FOR BLADDER STONE  . Total knee arthroplasty  1990    left  . Abdominal aortic aneurysm repair  04/2012  . Total knee arthroplasty Right 07/26/2012    Procedure: RIGHT TOTAL KNEE ARTHROPLASTY;  Surgeon: Tobi Bastos, MD;  Location: [facility];  Service: Orthopedics;  Laterality: Right;  . Nephrolithotomy Left 04/26/2013    Procedure: LEFT PERCUTANEOUS NEPHROLITHOTOMY ;  Surgeon: [name], MD;  Location: [facility];  Service: Urology;  Laterality: Left;  . Nephrolithotomy Left 05/03/2013    Procedure: 2ND STAGE LEFT PERCUTANEOUS NEPHROLITHOTOMY ;  Surgeon: [name], MD;  Location: [facility];  Service: Urology;  Laterality: Left;    Colman Cater MS,RD,CSG,LDN Office: [phone] Pager: [phone]

## 2014-02-08 NOTE — Progress Notes (Signed)
Discussed with Dr. Wendee Beavers yesterday. Dobbhoff feeding tube to be placed today.   Vital signs in last 24 hours: Temp:  [97.7 F (36.5 C)-98.2 F (36.8 C)] 97.7 F (36.5 C) (11/27 0346) Pulse Rate:  [56-100] 56 (11/27 0346) Resp:  [15-18] 18 (11/27 0346) BP: (110-159)/(70-84) 152/70 mmHg (11/27 0346) SpO2:  [94 %-100 %] 100 % (11/27 0346) Weight:  [212 lb 1.6 oz (96.208 kg)] 212 lb 1.6 oz (96.208 kg) (11/27 0530) Last BM Date: 02/05/14 General:    pleasant and cooperative in NAD Abdomen:  Soft, nontender and nondistended.  Extremities:  Without clubbing or edema.    Intake/Output from previous day: 11/26 0701 - 11/27 0700 In: 1125 [I.V.:1125] Out: 600 [Urine:600] Intake/Output this shift: Total I/O In: 360 [P.O.:360] Out: -   Lab Results:  Recent Labs  02/06/14 1454 02/07/14 0520  WBC 4.8 3.8*  HGB 10.1* 9.2*  HCT 31.4* 28.9*  PLT 72* 65*   BMET  Recent Labs  02/06/14 1454 02/07/14 0520 02/07/14 1218  NA 140 144 142  K 3.5* 3.7 4.0  CL 103 109 108  CO2 23 23 23   GLUCOSE 79 75 89  BUN 43* 40* 39*  CREATININE 2.03* 1.84* 1.78*  CALCIUM 11.1* 10.2 10.1   LFT  Recent Labs  02/07/14 0520  PROT 6.4  ALBUMIN 2.1*  AST 21  ALT <5  ALKPHOS 50  BILITOT 0.4     Impression/Recommendations:  80 year old male with failure to thrive, diminished oral intake, malnutrition and aspiration on recent contrast study. Agree with Dobbhoff placement for now. PEG placement tentatively for the first of the week. Plavix held. Thrombocytopenia - will need to be rechecked over the weekend.

## 2014-02-08 NOTE — Care Management Note (Addendum)
    Page 1 of 1   02/13/2014     1:47:53 PM CARE MANAGEMENT NOTE 02/13/2014  Patient:  [name], [name]   Account Number:  [id number]  Date Initiated:  02/08/2014  Documentation initiated by:  Vladimir Creeks  Subjective/Objective Assessment:   Admitted from [facility]. Admitted with dehydration/ failure to thrive. Pt is S/P AAA repair, and at the SNF for rehab, but is not doing well. He is to have a PEG placed once stable, and will return to the [facility]     Action/Plan:   CSW aware of pt and will assist wih D/C back to the [facility]   Anticipated DC Date:  02/11/2014   Anticipated DC Plan:  [place]  In-house referral  Clinical Social Worker      DC Planning Services  CM consult      Choice offered to / List presented to:             Status of service:  Completed, signed off Medicare Important Message given?  YES (If response is "NO", the following Medicare IM given date fields will be blank) Date Medicare IM given:  02/08/2014 Medicare IM given by:  Vladimir Creeks Date Additional Medicare IM given:  02/13/2014 Additional Medicare IM given by:  Theophilus Kinds  Discharge Disposition:  [place]  Per UR Regulation:  Reviewed for med. necessity/level of care/duration of stay  If discussed at [place] of Stay Meetings, dates discussed:   02/12/2014    Comments:  02/13/14 [place], RN BSN CM Pt discharged back to [facility] today. CSW to arrange discharge to facility.  02/11/14 [place], RN BSN Cm Pt having PEG placed today. Anticipate discharge to [facility] 02/12/14.  02/08/14 1330 Vladimir Creeks RN/CM

## 2014-02-08 NOTE — Plan of Care (Signed)
Problem: Phase I Progression Outcomes Goal: Voiding-avoid urinary catheter unless indicated Outcome: Completed/Met Date Met:  02/08/14     

## 2014-02-08 NOTE — Plan of Care (Signed)
Problem: Phase I Progression Outcomes Goal: Pain controlled with appropriate interventions Outcome: Completed/Met Date Met:  02/08/14     

## 2014-02-09 LAB — CBC
HCT: 27.5 % — ABNORMAL LOW (ref 39.0–52.0)
HEMOGLOBIN: 8.8 g/dL — AB (ref 13.0–17.0)
MCH: 26.7 pg (ref 26.0–34.0)
MCHC: 32 g/dL (ref 30.0–36.0)
MCV: 83.3 fL (ref 78.0–100.0)
Platelets: 72 10*3/uL — ABNORMAL LOW (ref 150–400)
RBC: 3.3 MIL/uL — ABNORMAL LOW (ref 4.22–5.81)
RDW: 19.6 % — ABNORMAL HIGH (ref 11.5–15.5)
WBC: 4.2 10*3/uL (ref 4.0–10.5)

## 2014-02-09 NOTE — Plan of Care (Signed)
Problem: Phase III Progression Outcomes Goal: Voiding independently Outcome: Completed/Met Date Met:  02/09/14     

## 2014-02-09 NOTE — Progress Notes (Signed)
Patient has had no residual during night.  Has tolerated feeding well.

## 2014-02-09 NOTE — Progress Notes (Signed)
[facility] PROGRESS NOTE  [name] DOB: [birth year] DOA: 02/06/2014 PCP: [name], MD  Assessment/Plan:    FTT (failure to thrive) in adult - Will obtain speech therapy evaluation - GI consulted for PEG tube placement, since waiting until Monday. We'll recommend Dobbhoff placement as such will consult IR. - Agree with holding Plavix for PEG tube placement  Severe malnutrition - Pt meets criteria for severe MALNUTRITION in the context of chronic illness as evidenced by wt loss of 12% in 30 days and 26% in < 1 year with energy intake </= 75% for >/=1 month - Dobboff placed - RD on board please see jevity recommendations  Active Problems:   Abdominal aortic aneurysm  - stable currently - Stent placement complicated by Pseudomonas stent infection requiring ongoing antibiotic treatment.    HTN (hypertension) - on Metoprolol currently    Thrombocytopenia - ? Etiology, but trending up - We'll plan on holding aspirin and platelet count goes below 60,000    Dehydration - Improvedwith IV fluid rehydration. And feeds via Dobboff - saline lock   Code Status: full Family Communication:  Discussed with daughter and son at bedside. Disposition Plan: Peg tube placement most likey 02/11/14   Consultants:  GI: Dr. Gala Romney  Procedures:  none  Antibiotics:  cefepime  HPI/Subjective: Pt has no new complaints. No acute issues reported overnight.  Objective: Filed Vitals:   02/09/14 0607  BP: 156/83  Pulse: 61  Temp: 98.7 F (37.1 C)  Resp: 18    Intake/Output Summary (Last 24 hours) at 02/09/14 1532 Last data filed at 02/09/14 1026  Gross per 24 hour  Intake 886.75 ml  Output   1150 ml  Net -263.25 ml   Filed Weights   02/06/14 1414 02/06/14 1835 02/08/14 0530  Weight: 94.348 kg (208 lb) 94.121 kg (207 lb 8 oz) 96.208 kg (212 lb 1.6 oz)    Exam:   General:  Pt in nad, alert and awake  Cardiovascular: rrr, no  mrg  Respiratory: cta bl, no wheezes  Abdomen: soft, ND  Musculoskeletal: no cyanosis or clubbing   Data Reviewed: Basic Metabolic Panel:  Recent Labs Lab 02/06/14 1454 02/07/14 0520 02/07/14 1218 02/08/14 1146  NA 140 144 142  --   K 3.5* 3.7 4.0  --   CL 103 109 108  --   CO2 23 23 23   --   GLUCOSE 79 75 89  --   BUN 43* 40* 39*  --   CREATININE 2.03* 1.84* 1.78*  --   CALCIUM 11.1* 10.2 10.1  --   PHOS  --   --   --  2.2*   Liver Function Tests:  Recent Labs Lab 02/06/14 1454 02/07/14 0520  AST 13 21  ALT <5 <5  ALKPHOS 57 50  BILITOT 0.6 0.4  PROT 7.3 6.4  ALBUMIN 2.4* 2.1*    Recent Labs Lab 02/06/14 1454  LIPASE 69*   No results for input(s): AMMONIA in the last 168 hours. CBC:  Recent Labs Lab 02/06/14 1454 02/07/14 0520 02/09/14 1107  WBC 4.8 3.8* 4.2  NEUTROABS 3.6  --   --   HGB 10.1* 9.2* 8.8*  HCT 31.4* 28.9* 27.5*  MCV 82.6 83.8 83.3  PLT 72* 65* 72*   Cardiac Enzymes:  Recent Labs Lab 02/06/14 1454  TROPONINI <0.30   BNP (last 3 results) No results for input(s): PROBNP in the last 8760 hours. CBG:  Recent Labs Lab 02/06/14 1412 02/06/14 2039  GLUCAP 75  78    Recent Results (from the past 240 hour(s))  Urine culture     Status: None   Collection Time: 02/06/14  2:55 PM  Result Value Ref Range Status   Specimen Description URINE, CATHETERIZED  Final   Special Requests NONE  Final   Culture  Setup Time   Final    02/06/2014 22:59 Performed at [place] Performed at [facility]   Final   Culture NO GROWTH Performed at [facility]   Final   Report Status 02/08/2014 FINAL  Final     Studies: Dg Chest Port 1 View  02/08/2014   CLINICAL DATA:  Feeding tube placement.  EXAM: PORTABLE CHEST - 1 VIEW  COMPARISON:  02/06/2014, 01/11/2014, 07/27/2012.  FINDINGS: Nasogastric tube with tip below hemidiaphragms. Aortic stent graft. Cardiomegaly. Pulmonary  vascularity is normal. No focal infiltrate. Mild basilar atelectasis stable. Elevation left hemidiaphragm. Mild right pleural effusion. No acute bony abnormality.  IMPRESSION: 1. Nasogastric tube with tip below hemidiaphragms. 2. Stable cardiomegaly.Aortic stent graft in stable position. 3. Mild basilar atelectasis.  Small right pleural effusion.   Electronically Signed   By: [place]   On: 02/08/2014 15:01    Scheduled Meds: . aspirin  81 mg Oral Daily  . ceFEPime (MAXIPIME) IV  2 g Intravenous Q24H  . doxazosin  4 mg Oral QHS  . megestrol  400 mg Oral Daily  . metoprolol succinate  25 mg Oral Daily  . mirtazapine  15 mg Oral QHS  . pantoprazole  40 mg Oral Daily  . senna  2 tablet Oral QHS  . simvastatin  20 mg Oral Daily  . sodium chloride  3 mL Intravenous Q12H   Continuous Infusions: . sodium chloride 75 mL/hr at 02/08/14 1634  . feeding supplement (JEVITY 1.2 CAL) 1,000 mL (02/09/14 1026)     Time spent: > 15    Aryianna Earwood, [place] [facility] Pager [phone] If 7PM-7AM, please contact night-coverage at [email or web address], password [facility] 02/09/2014, 3:32 PM  LOS: 3 days

## 2014-02-09 NOTE — Plan of Care (Signed)
Problem: Phase II Progression Outcomes Goal: Obtain order to discontinue catheter if appropriate Outcome: Not Applicable Date Met:  46/56/81

## 2014-02-10 ENCOUNTER — Inpatient Hospital Stay (HOSPITAL_COMMUNITY): Payer: Medicare Other

## 2014-02-10 DIAGNOSIS — E876 Hypokalemia: Secondary | ICD-10-CM

## 2014-02-10 LAB — BASIC METABOLIC PANEL
Anion gap: 10 (ref 5–15)
BUN: 27 mg/dL — ABNORMAL HIGH (ref 6–23)
CALCIUM: 9.5 mg/dL (ref 8.4–10.5)
CO2: 25 mEq/L (ref 19–32)
Chloride: 111 mEq/L (ref 96–112)
Creatinine, Ser: 1.41 mg/dL — ABNORMAL HIGH (ref 0.50–1.35)
GFR calc Af Amer: 52 mL/min — ABNORMAL LOW (ref >90)
GFR calc non Af Amer: 45 mL/min — ABNORMAL LOW (ref >90)
Glucose, Bld: 98 mg/dL (ref 70–99)
Potassium: 2.9 mEq/L — CL (ref 3.7–5.3)
SODIUM: 146 meq/L (ref 137–147)

## 2014-02-10 LAB — MAGNESIUM: Magnesium: 1.6 mg/dL (ref 1.5–2.5)

## 2014-02-10 MED ORDER — POTASSIUM CHLORIDE 10 MEQ/100ML IV SOLN
10.0000 meq | INTRAVENOUS | Status: AC
  Administered 2014-02-10 (×2): 10 meq via INTRAVENOUS
  Filled 2014-02-10 (×2): qty 100

## 2014-02-10 MED ORDER — SODIUM CHLORIDE 0.9 % IV SOLN
INTRAVENOUS | Status: DC
  Administered 2014-02-10: 23:00:00 via INTRAVENOUS

## 2014-02-10 MED ORDER — POTASSIUM CHLORIDE CRYS ER 20 MEQ PO TBCR
40.0000 meq | EXTENDED_RELEASE_TABLET | Freq: Once | ORAL | Status: AC
  Administered 2014-02-10: 40 meq via ORAL
  Filled 2014-02-10: qty 2

## 2014-02-10 MED ORDER — CEFAZOLIN SODIUM 1-5 GM-% IV SOLN
1.0000 g | Freq: Once | INTRAVENOUS | Status: AC
  Administered 2014-02-11 (×2): 1 g via INTRAVENOUS
  Filled 2014-02-10 (×2): qty 50

## 2014-02-10 NOTE — Plan of Care (Signed)
Problem: Phase II Progression Outcomes Goal: IV changed to normal saline lock Outcome: Completed/Met Date Met:  02/10/14

## 2014-02-10 NOTE — Plan of Care (Signed)
Problem: Phase II Progression Outcomes Goal: Vital signs remain stable Outcome: Completed/Met Date Met:  02/10/14     

## 2014-02-10 NOTE — Progress Notes (Addendum)
Patient without complaints. Tube feeds have gone well over the past couple of days with essentially Nil residuals. Patient partially pulled tube out inadvertently; tube feeds interrupted for replacement and abdominal films.    Vital signs in last 24 hours: Temp:  [98 F (36.7 C)-98.3 F (36.8 C)] 98.3 F (36.8 C) (11/29 0531) Pulse Rate:  [62-72] 63 (11/29 0531) Resp:  [18] 18 (11/29 0531) BP: (141-155)/(70-81) 155/80 mmHg (11/29 0531) SpO2:  [96 %-100 %] 96 % (11/29 0531) Weight:  [212 lb 5.3 oz (96.314 kg)] 212 lb 5.3 oz (96.314 kg) (11/29 0531) Last BM Date: 02/05/14 General:   Elderly somewhat hard of hearing pleasant and cooperative in NAD Abdomen:  Nondistended. Normal bowel sounds, without guarding, and without rebound.  No mass or organomegaly. Extremities:  Without clubbing or edema.    Intake/Output from previous day: 11/28 0701 - 11/29 0700 In: 363 [P.O.:360; I.V.:3] Out: 1580 [Urine:1580] Intake/Output this shift: Total I/O In: -  Out: 325 [Urine:325]  Lab Results:  Recent Labs  02/09/14 1107  WBC 4.2  HGB 8.8*  HCT 27.5*  PLT 72*   BMET  Recent Labs  02/07/14 1218 02/10/14 0647  NA 142 146  K 4.0 2.9*  CL 108 111  CO2 23 25  GLUCOSE 89 98  BUN 39* 27*  CREATININE 1.78* 1.41*  CALCIUM 10.1 9.5     Impression:     80 year old gentleman with aspiration, failure to thrive. Needs PEG placement.   Significantly hypokalemic today - being repleted.  Platelet count stable in the 70,000 range. Patient has   been off Plavix for over 5 days at the time of PEG placement. Clotting parameters should be okay.  Recommendations:   Plan for PEG placement tomorrow. Risks, benefits and limitations and alternatives previously reviewed with family members.  Recheck potassium tomorrow morning. Ancef 1 g IV on call (discussed penicillin allergy with the pharmacy-Ancef should be okay). Further recommendations to follow.

## 2014-02-10 NOTE — Progress Notes (Signed)
Patient was sitting on the side of the bed using urinal.  During that time, patient had manipulated tubing  Where dubhoff tubing was not in same position as in beginning of shift.  At beginning of shift, patient marking was at 10, now patient tubing is marked at 33.  Notified md on call received no new orders.  Patient tube feeding temporarily turned off until attempt at moving tube to previous position.  Patient in no distress or shows any sign of aspiration.  Will continue to monitor patient.

## 2014-02-10 NOTE — Progress Notes (Signed)
[facility] PROGRESS NOTE  JUSTINO BOZE XEN:[id number] DOB: [birth year] DOA: 02/06/2014 PCP: [name], MD  Assessment/Plan:    FTT (failure to thrive) in adult - Will obtain speech therapy evaluation - GI consulted for PEG tube placement, since waiting until Monday. We'll recommend Dobbhoff placement as such will consult IR. - Agree with holding Plavix for PEG tube placement  Hypokalemia - new problem, will treat with 2 runs of K replacement IV - K dur 40 meq x 1  - reassess next am.  Severe malnutrition - Pt meets criteria for severe MALNUTRITION in the context of chronic illness as evidenced by wt loss of 12% in 30 days and 26% in < 1 year with energy intake </= 75% for >/=1 month - Dobboff placed, pt accidentally pulled it a few inches. Will have nursing replace and obtain x ray for positioning. - RD on board please see jevity recommendations  Active Problems:   Abdominal aortic aneurysm  - stable currently - Stent placement complicated by Pseudomonas stent infection requiring ongoing antibiotic treatment.    HTN (hypertension) - on Metoprolol currently    Thrombocytopenia - ? Etiology, but trending up - We'll plan on holding aspirin and platelet count goes below 60,000    Dehydration - Improvedwith IV fluid rehydration. And feeds via Dobboff - saline lock   Code Status: full Family Communication:  Discussed with daughter and son at bedside. Disposition Plan: Peg tube placement most likey 02/11/14   Consultants:  GI: Dr. Gala Romney  Procedures:  none  Antibiotics:  cefepime  HPI/Subjective: Pt has no new complaints. No acute issues reported overnight.  Objective: Filed Vitals:   02/10/14 0531  BP: 155/80  Pulse: 63  Temp: 98.3 F (36.8 C)  Resp: 18    Intake/Output Summary (Last 24 hours) at 02/10/14 1112 Last data filed at 02/10/14 1015  Gross per 24 hour  Intake    123 ml  Output   1555 ml  Net  -1432 ml   Filed Weights   02/06/14 1835 02/08/14 0530 02/10/14 0531  Weight: 94.121 kg (207 lb 8 oz) 96.208 kg (212 lb 1.6 oz) 96.314 kg (212 lb 5.3 oz)    Exam:   General:  Pt in nad, alert and awake  Cardiovascular: rrr, no mrg  Respiratory: cta bl, no wheezes  Abdomen: soft, ND  Musculoskeletal: no cyanosis or clubbing   Data Reviewed: Basic Metabolic Panel:  Recent Labs Lab 02/06/14 1454 02/07/14 0520 02/07/14 1218 02/08/14 1146 02/10/14 0647  NA 140 144 142  --  146  K 3.5* 3.7 4.0  --  2.9*  CL 103 109 108  --  111  CO2 23 23 23   --  25  GLUCOSE 79 75 89  --  98  BUN 43* 40* 39*  --  27*  CREATININE 2.03* 1.84* 1.78*  --  1.41*  CALCIUM 11.1* 10.2 10.1  --  9.5  PHOS  --   --   --  2.2*  --    Liver Function Tests:  Recent Labs Lab 02/06/14 1454 02/07/14 0520  AST 13 21  ALT <5 <5  ALKPHOS 57 50  BILITOT 0.6 0.4  PROT 7.3 6.4  ALBUMIN 2.4* 2.1*    Recent Labs Lab 02/06/14 1454  LIPASE 69*   No results for input(s): AMMONIA in the last 168 hours. CBC:  Recent Labs Lab 02/06/14 1454 02/07/14 0520 02/09/14 1107  WBC 4.8 3.8* 4.2  NEUTROABS 3.6  --   --  HGB 10.1* 9.2* 8.8*  HCT 31.4* 28.9* 27.5*  MCV 82.6 83.8 83.3  PLT 72* 65* 72*   Cardiac Enzymes:  Recent Labs Lab 02/06/14 1454  TROPONINI <0.30   BNP (last 3 results) No results for input(s): PROBNP in the last 8760 hours. CBG:  Recent Labs Lab 02/06/14 1412 02/06/14 2039  GLUCAP 75 78    Recent Results (from the past 240 hour(s))  Urine culture     Status: None   Collection Time: 02/06/14  2:55 PM  Result Value Ref Range Status   Specimen Description URINE, CATHETERIZED  Final   Special Requests NONE  Final   Culture  Setup Time   Final    02/06/2014 22:59 Performed at [place] Performed at [facility]   Final   Culture NO GROWTH Performed at [facility]   Final   Report Status 02/08/2014 FINAL  Final     Studies: Dg Chest  Port 1 View  02/08/2014   CLINICAL DATA:  Feeding tube placement.  EXAM: PORTABLE CHEST - 1 VIEW  COMPARISON:  02/06/2014, 01/11/2014, 07/27/2012.  FINDINGS: Nasogastric tube with tip below hemidiaphragms. Aortic stent graft. Cardiomegaly. Pulmonary vascularity is normal. No focal infiltrate. Mild basilar atelectasis stable. Elevation left hemidiaphragm. Mild right pleural effusion. No acute bony abnormality.  IMPRESSION: 1. Nasogastric tube with tip below hemidiaphragms. 2. Stable cardiomegaly.Aortic stent graft in stable position. 3. Mild basilar atelectasis.  Small right pleural effusion.   Electronically Signed   By: [place]   On: 02/08/2014 15:01    Scheduled Meds: . aspirin  81 mg Oral Daily  . ceFEPime (MAXIPIME) IV  2 g Intravenous Q24H  . doxazosin  4 mg Oral QHS  . megestrol  400 mg Oral Daily  . metoprolol succinate  25 mg Oral Daily  . mirtazapine  15 mg Oral QHS  . pantoprazole  40 mg Oral Daily  . potassium chloride  10 mEq Intravenous Q1 Hr x 2  . potassium chloride  40 mEq Oral Once  . senna  2 tablet Oral QHS  . simvastatin  20 mg Oral Daily  . sodium chloride  3 mL Intravenous Q12H   Continuous Infusions: . feeding supplement (JEVITY 1.2 CAL) 1,000 mL (02/09/14 1817)     Time spent: > 37    Koralee Wedeking, [place] [facility] Pager [phone] If 7PM-7AM, please contact night-coverage at [email or web address], password [facility] 02/10/2014, 11:12 AM  LOS: 4 days

## 2014-02-11 ENCOUNTER — Encounter (HOSPITAL_COMMUNITY)
Admission: EM | Disposition: A | Discharge status: Skilled Nursing Facility | Payer: Self-pay | Source: Home / Self Care | Attending: Family Medicine

## 2014-02-11 ENCOUNTER — Encounter (HOSPITAL_COMMUNITY): Payer: Self-pay

## 2014-02-11 DIAGNOSIS — Z931 Gastrostomy status: Secondary | ICD-10-CM

## 2014-02-11 DIAGNOSIS — R1310 Dysphagia, unspecified: Secondary | ICD-10-CM

## 2014-02-11 HISTORY — PX: PEG PLACEMENT: SHX5437

## 2014-02-11 HISTORY — DX: Gastrostomy status: Z93.1

## 2014-02-11 HISTORY — PX: ESOPHAGOGASTRODUODENOSCOPY: SHX5428

## 2014-02-11 LAB — BASIC METABOLIC PANEL
Anion gap: 10 (ref 5–15)
BUN: 26 mg/dL — ABNORMAL HIGH (ref 6–23)
CO2: 27 mEq/L (ref 19–32)
CREATININE: 1.41 mg/dL — AB (ref 0.50–1.35)
Calcium: 9.5 mg/dL (ref 8.4–10.5)
Chloride: 109 mEq/L (ref 96–112)
GFR calc Af Amer: 52 mL/min — ABNORMAL LOW (ref >90)
GFR calc non Af Amer: 45 mL/min — ABNORMAL LOW (ref >90)
GLUCOSE: 93 mg/dL (ref 70–99)
POTASSIUM: 3.7 meq/L (ref 3.7–5.3)
Sodium: 146 mEq/L (ref 137–147)

## 2014-02-11 LAB — CBC
HCT: 28.9 % — ABNORMAL LOW (ref 39.0–52.0)
Hemoglobin: 9.2 g/dL — ABNORMAL LOW (ref 13.0–17.0)
MCH: 26.4 pg (ref 26.0–34.0)
MCHC: 31.8 g/dL (ref 30.0–36.0)
MCV: 83 fL (ref 78.0–100.0)
Platelets: 81 10*3/uL — ABNORMAL LOW (ref 150–400)
RBC: 3.48 MIL/uL — AB (ref 4.22–5.81)
RDW: 19.8 % — AB (ref 11.5–15.5)
WBC: 5.3 10*3/uL (ref 4.0–10.5)

## 2014-02-11 LAB — GLUCOSE, CAPILLARY: Glucose-Capillary: 90 mg/dL (ref 70–99)

## 2014-02-11 LAB — PHOSPHORUS: Phosphorus: 2.2 mg/dL — ABNORMAL LOW (ref 2.3–4.6)

## 2014-02-11 SURGERY — EGD (ESOPHAGOGASTRODUODENOSCOPY)
Anesthesia: Moderate Sedation

## 2014-02-11 MED ORDER — SODIUM CHLORIDE 0.9 % IV SOLN: INTRAVENOUS | Status: DC

## 2014-02-11 MED ORDER — MIDAZOLAM HCL 5 MG/5ML IJ SOLN
INTRAMUSCULAR | Status: AC
Start: 2014-02-11 — End: 2014-02-12
  Filled 2014-02-11: qty 10

## 2014-02-11 MED ORDER — MEPERIDINE HCL 100 MG/ML IJ SOLN
INTRAMUSCULAR | Status: AC
  Filled 2014-02-11: qty 2

## 2014-02-11 MED ORDER — ONDANSETRON HCL 4 MG/2ML IJ SOLN
INTRAMUSCULAR | Status: AC
Start: 2014-02-11 — End: 2014-02-12
  Filled 2014-02-11: qty 2

## 2014-02-11 MED ORDER — PRO-STAT SUGAR FREE PO LIQD
30.0000 mL | Freq: Three times a day (TID) | ORAL | Status: DC
  Administered 2014-02-11 – 2014-02-13 (×6): 30 mL
  Filled 2014-02-11 (×6): qty 30

## 2014-02-11 MED ORDER — MIDAZOLAM HCL 5 MG/5ML IJ SOLN
INTRAMUSCULAR | Status: DC | PRN
  Administered 2014-02-11: 1 mg via INTRAVENOUS

## 2014-02-11 MED ORDER — JEVITY 1.2 CAL PO LIQD
1000.0000 mL | ORAL | Status: DC
  Administered 2014-02-11 – 2014-02-13 (×2): 1000 mL
  Filled 2014-02-11 (×5): qty 1000

## 2014-02-11 MED ORDER — LIDOCAINE VISCOUS 2 % MT SOLN
OROMUCOSAL | Status: AC
  Filled 2014-02-11: qty 15

## 2014-02-11 MED ORDER — BUTAMBEN-TETRACAINE-BENZOCAINE 2-2-14 % EX AERO
INHALATION_SPRAY | CUTANEOUS | Status: DC | PRN
  Administered 2014-02-11: 2 via TOPICAL

## 2014-02-11 MED ORDER — STERILE WATER FOR IRRIGATION IR SOLN
Status: DC | PRN
  Administered 2014-02-11: 13:00:00

## 2014-02-11 MED ORDER — LIDOCAINE 1 % OPTIME INJ - NO CHARGE
INTRAMUSCULAR | Status: DC | PRN
  Administered 2014-02-11: 4 mg via INTRADERMAL

## 2014-02-11 MED ORDER — ONDANSETRON HCL 4 MG/2ML IJ SOLN
INTRAMUSCULAR | Status: DC | PRN
  Administered 2014-02-11: 4 mg via INTRAVENOUS

## 2014-02-11 NOTE — Progress Notes (Addendum)
[facility] PROGRESS NOTE  [name] BZJ:[id number] DOB: 1929/06/20 DOA: 02/06/2014 PCP: [name], MD  Assessment/Plan:    FTT (failure to thrive) in adult - Will obtain speech therapy evaluation - Pt is s/p peg tube placement - Plavix held for PEG tube placement. Will defer to GI (addendum when to continue Plavix) - obtain a physical therapy evaluation  Hypokalemia - resolved after replacement  Severe malnutrition - Pt meets criteria for severe MALNUTRITION in the context of chronic illness as evidenced by wt loss of 12% in 30 days and 26% in < 1 year with energy intake </= 75% for >/=1 month - Dobboff placed, pt accidentally pulled it a few inches. Will have nursing replace and obtain x ray for positioning. - RD on board please see jevity recommendations  Active Problems:   Abdominal aortic aneurysm  - stable currently - Stent placement complicated by Pseudomonas stent infection requiring ongoing antibiotic treatment.    HTN (hypertension) - on Metoprolol currently    Thrombocytopenia - ? Etiology, but trending up - We'll plan on holding aspirin and platelet count goes below 60,000    Dehydration - Improvedwith IV fluid rehydration. And feeds via Dobboff - saline lock   Code Status: full Family Communication:  Discussed with daughter and son at bedside. Disposition Plan: Peg tube placement most likey 02/11/14   Consultants:  GI: Dr. Gala Romney  Procedures:  none  Antibiotics:  cefepime  HPI/Subjective: Pt has no new complaints. Daughters were worried about patient being congested. Requested chest x-ray. Chest x-ray performed yesterday with no findings of pneumonia.  Objective: Filed Vitals:   02/11/14 1350  BP: 155/51  Pulse: 88  Temp: 98 F (36.7 C)  Resp: 18    Intake/Output Summary (Last 24 hours) at 02/11/14 1454 Last data filed at 02/11/14 0175  Gross per 24 hour  Intake      0 ml  Output    650 ml  Net   -650 ml   Filed  Weights   02/08/14 0530 02/10/14 0531 02/11/14 0636  Weight: 96.208 kg (212 lb 1.6 oz) 96.314 kg (212 lb 5.3 oz) 98.884 kg (218 lb)    Exam:   General:  Pt in nad, alert and awake  Cardiovascular: rrr, no mrg  Respiratory: cta bl, no wheezes  Abdomen: soft, ND  Musculoskeletal: no cyanosis or clubbing   Data Reviewed: Basic Metabolic Panel:  Recent Labs Lab 02/06/14 1454 02/07/14 0520 02/07/14 1218 02/08/14 1146 02/10/14 0647 02/10/14 0652 02/11/14 0603  NA 140 144 142  --  146  --  146  K 3.5* 3.7 4.0  --  2.9*  --  3.7  CL 103 109 108  --  111  --  109  CO2 23 23 23   --  25  --  27  GLUCOSE 79 75 89  --  98  --  93  BUN 43* 40* 39*  --  27*  --  26*  CREATININE 2.03* 1.84* 1.78*  --  1.41*  --  1.41*  CALCIUM 11.1* 10.2 10.1  --  9.5  --  9.5  MG  --   --   --   --   --  1.6  --   PHOS  --   --   --  2.2*  --   --  2.2*   Liver Function Tests:  Recent Labs Lab 02/06/14 1454 02/07/14 0520  AST 13 21  ALT <5 <5  ALKPHOS 57 50  BILITOT  0.6 0.4  PROT 7.3 6.4  ALBUMIN 2.4* 2.1*    Recent Labs Lab 02/06/14 1454  LIPASE 69*   No results for input(s): AMMONIA in the last 168 hours. CBC:  Recent Labs Lab 02/06/14 1454 02/07/14 0520 02/09/14 1107 02/11/14 0603  WBC 4.8 3.8* 4.2 5.3  NEUTROABS 3.6  --   --   --   HGB 10.1* 9.2* 8.8* 9.2*  HCT 31.4* 28.9* 27.5* 28.9*  MCV 82.6 83.8 83.3 83.0  PLT 72* 65* 72* 81*   Cardiac Enzymes:  Recent Labs Lab 02/06/14 1454  TROPONINI <0.30   BNP (last 3 results) No results for input(s): PROBNP in the last 8760 hours. CBG:  Recent Labs Lab 02/06/14 1412 02/06/14 2039  GLUCAP 75 78    Recent Results (from the past 240 hour(s))  Urine culture     Status: None   Collection Time: 02/06/14  2:55 PM  Result Value Ref Range Status   Specimen Description URINE, CATHETERIZED  Final   Special Requests NONE  Final   Culture  Setup Time   Final    02/06/2014 22:59 Performed at [place] Performed at [facility]   Final   Culture NO GROWTH Performed at [facility]   Final   Report Status 02/08/2014 FINAL  Final     Studies: Dg Chest Port 1 View  02/10/2014   CLINICAL DATA:  Doppler of tube placement  EXAM: PORTABLE CHEST - 1 VIEW  COMPARISON:  02/08/2014  FINDINGS: Cardiomediastinal silhouette is stable. Aortic stent is unchanged in position. Stable right PICC line position. There is a NG feeding tube with tip in proximal stomach below the level of diaphragm.  IMPRESSION: NG feeding tube with tip in proximal stomach below of the level of diaphragm.   Electronically Signed   By: Lahoma Crocker M.D.   On: 02/10/2014 12:48    Scheduled Meds: . aspirin  81 mg Oral Daily  . ceFEPime (MAXIPIME) IV  2 g Intravenous Q24H  . doxazosin  4 mg Oral QHS  . megestrol  400 mg Oral Daily  . meperidine      . metoprolol succinate  25 mg Oral Daily  . midazolam      . mirtazapine  15 mg Oral QHS  . ondansetron      . pantoprazole  40 mg Oral Daily  . senna  2 tablet Oral QHS  . simvastatin  20 mg Oral Daily  . sodium chloride  3 mL Intravenous Q12H   Continuous Infusions: . feeding supplement (JEVITY 1.2 CAL) 1,000 mL (02/10/14 1339)     Time spent: > 35 minutes    Velvet Bathe  [facility] Pager [phone] If 7PM-7AM, please contact night-coverage at [email or web address], password [facility] 02/11/2014, 2:54 PM  LOS: 5 days

## 2014-02-11 NOTE — H&P (View-Only) (Signed)
Referring Provider: No ref. provider found Primary Care Physician:  [name], MD Primary Gastroenterologist:  Dr.Rourk  Reason for Consultation:  Failure to thrive; needs PEG placement   History of present illness:  80 year old male [name] admitted to the hospital last evening with progressive failure to thrive, difficulty taking oral nutrition along with dehydration.. The [name] is significantly hard of hearing without his hearing aids. Much of the history provided by his daughter [name] [phone])  this morning. Over the past few months [name] has progressively had more and more difficulty not taking in any of his meals. Apparently, he attempts to swallow food may go down to some degree but then he vomits it up. Interestingly, he does okay with liquids and is able to get his medications daily. Hasn't really had any apparent recent abdominal pain;   bowel function is not as good as previously.  Tendency now towards constipation or simply decreased bowel function with diminished oral intake. No reported hematochezia, melena or hematemesis.  Recent limited swallowing examination done here which revealed gross aspiration-exam terminated.  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  Stent placement complicated by Pseudomonas stent infection requiring ongoing antibiotic treatment.  I reviewed the CT with Dr. Reesa Chew this morning. These vascular changes are significantly posterior to the stomach and there does not appear to be any structural/vascular hazards between the anterior stomach and the abdominal wall.  [name] has been taking Plavix up until yesterday.  According to family members, no significant GI illnesses in the past other complicated diverticulitis requiring a segmental resection of his colon and temporary colostomy. He does take a  PPI daily. Otherwise,  unsure about prior colonoscopy or prior gastrointestinal illness. I note the [name] saw Dr. Jim Desanctis back in 2004 but details of that encounter are not known. No family history of GI neoplasia.  Platelet count 65,000 this morning.    Past Medical History  Diagnosis Date  . Hypertension   . BPH (benign prostatic hyperplasia)   . Hyperlipidemia   . Cancer 2008-2009    prostate TREATED WITH RADIATION  . AAA (abdominal aortic aneurysm) 04/2012    STENTING OF AAA IN [place]  . Junctional cardiac arrhythmia     Occurred postoperatively after urologic surgery  . Severe sinus bradycardia     Occurred postoperatively after urologic surgery  . Aortic valve disorders   . Other primary cardiomyopathies     Past Surgical History  Procedure Laterality Date  . Abdominal surgery      PART OF COLON REMOVED FOR DIVERTICULITIS  . Appendectomy    . Hernia repair    . Bladder surgery  2008    FOR BLADDER STONE  . Total knee arthroplasty  1990    left  . Abdominal aortic aneurysm repair  04/2012  . Total knee arthroplasty Right 07/26/2012    Procedure: RIGHT TOTAL KNEE ARTHROPLASTY;  Surgeon: Tobi Bastos, MD;  Location: [facility];  Service: Orthopedics;  Laterality: Right;  . Nephrolithotomy Left 04/26/2013    Procedure: LEFT PERCUTANEOUS NEPHROLITHOTOMY ;  Surgeon: [name], MD;  Location: [facility];  Service: Urology;  Laterality: Left;  . Nephrolithotomy Left 05/03/2013    Procedure: 2ND STAGE LEFT PERCUTANEOUS NEPHROLITHOTOMY ;  Surgeon: [name], MD;  Location: [facility];  Service: Urology;  Laterality: Left;    Prior to Admission medications   Medication  Sig Start Date End Date Taking? Authorizing Provider  acetaminophen (TYLENOL) 500 MG tablet Take 1,000 mg by mouth every 6 (Elijah) hours as needed for mild pain or moderate pain.   Yes Historical Provider, MD  aspirin 81 MG chewable tablet Chew 81 mg by mouth daily.  10/11/13 10/11/14 Yes Historical Provider, MD    ceFEPIme 2 g in dextrose 5 % 50 mL Inject 2 g into the vein at bedtime.  01/10/14 02/26/14 Yes Historical Provider, MD  clopidogrel (PLAVIX) 75 MG tablet Take 75 mg by mouth daily. 01/10/14 01/10/15 Yes Historical Provider, MD  docusate sodium (COLACE) 100 MG capsule Take 100 mg by mouth daily as needed for mild constipation.   Yes Historical Provider, MD  doxazosin (CARDURA) 4 MG tablet Take 4 mg by mouth at bedtime.   Yes Historical Provider, MD  LORazepam (ATIVAN) 0.5 MG tablet Take 1/2 tablet by mouth every 8 hours as needed for anxiety [name] taking differently: Take 0.25 mg by mouth every 8 (eight) hours as needed for anxiety. Take 1/2 tablet by mouth every 8 hours as needed for anxiety 01/21/14  Yes Lauree Chandler, NP  megestrol (MEGACE) 400 MG/10ML suspension Take 400 mg by mouth daily.   Yes Historical Provider, MD  metoprolol succinate (TOPROL-XL) 25 MG 24 hr tablet Take 25 mg by mouth daily.   Yes Historical Provider, MD  mirtazapine (REMERON) 15 MG tablet Take 15 mg by mouth at bedtime.   Yes Historical Provider, MD  omeprazole (PRILOSEC) 40 MG capsule Take 40 mg by mouth daily.   Yes Historical Provider, MD  polyethylene glycol (MIRALAX / GLYCOLAX) packet Take 17 g by mouth daily as needed for mild constipation.    Yes Historical Provider, MD  senna (SENOKOT) 8.6 MG TABS tablet Take 2 tablets by mouth at bedtime.   Yes Historical Provider, MD  simvastatin (ZOCOR) 20 MG tablet Take 20 mg by mouth daily.   Yes Historical Provider, MD  traMADol (ULTRAM) 50 MG tablet Take 50 mg by mouth every 6 (Elijah) hours as needed for moderate pain.   Yes Historical Provider, MD    Current Facility-Administered Medications  Medication Dose Route Frequency Provider Last Rate Last Dose  . 0.9 %  sodium chloride infusion   Intravenous Continuous Doree Albee, MD 125 mL/hr at 02/07/14 0431    . acetaminophen (TYLENOL) tablet 1,000 mg  1,000 mg Oral Q6H PRN Nimish C Gosrani, MD      . aspirin  chewable tablet 81 mg  81 mg Oral Daily Nimish C Anastasio Champion, MD   81 mg at 02/07/14 0957  . ceFEPIme (MAXIPIME) 2 g in dextrose 5 % 50 mL IVPB  2 g Intravenous Q24H Doree Albee, MD   2 g at 02/06/14 2132  . clopidogrel (PLAVIX) tablet 75 mg  75 mg Oral Daily Doree Albee, MD   75 mg at 02/07/14 0957  . docusate sodium (COLACE) capsule 100 mg  100 mg Oral Daily PRN Nimish C Gosrani, MD      . doxazosin (CARDURA) tablet 4 mg  4 mg Oral QHS Doree Albee, MD   4 mg at 02/06/14 2133  . LORazepam (ATIVAN) tablet 0.25 mg  0.25 mg Oral Q8H PRN Nimish C Gosrani, MD      . megestrol (MEGACE) 400 MG/10ML suspension 400 mg  400 mg Oral Daily Nimish C Anastasio Champion, MD   400 mg at 02/07/14 0957  . metoprolol succinate (TOPROL-XL) 24 hr tablet 25 mg  25 mg Oral Daily Doree Albee, MD   25 mg at 02/07/14 0958  . mirtazapine (REMERON) tablet 15 mg  15 mg Oral QHS Doree Albee, MD   15 mg at 02/06/14 2134  . ondansetron (ZOFRAN) tablet 4 mg  4 mg Oral Q6H PRN Nimish C Gosrani, MD       Or  . ondansetron (ZOFRAN) injection 4 mg  4 mg Intravenous Q6H PRN Nimish C Gosrani, MD      . pantoprazole (PROTONIX) EC tablet 40 mg  40 mg Oral Daily Nimish C Anastasio Champion, MD   40 mg at 02/07/14 0957  . polyethylene glycol (MIRALAX / GLYCOLAX) packet 17 g  17 g Oral Daily PRN Nimish Luther Parody, MD      . senna (SENOKOT) tablet 17.2 mg  2 tablet Oral QHS Doree Albee, MD   17.2 mg at 02/06/14 2133  . simvastatin (ZOCOR) tablet 20 mg  20 mg Oral Daily Nimish C Anastasio Champion, MD   20 mg at 02/07/14 0957  . sodium chloride 0.9 % injection 3 mL  3 mL Intravenous Q12H Nimish C Gosrani, MD   3 mL at 02/06/14 2200  . traMADol (ULTRAM) tablet 50 mg  50 mg Oral Q6H PRN Doree Albee, MD        Allergies as of 02/06/2014 - Review Complete 02/06/2014  Allergen Reaction Noted  . Atorvastatin Other (See Comments) 01/11/2014  . Penicillins Other (See Comments) 11/09/2010    Family History  Problem Relation Age of Onset  . Lung  cancer Father     History   Social History  . Marital Status: Married    Spouse Name: N/A    Number of Children: N/A  . Years of Education: N/A   Occupational History  . Not on file.   Social History Main Topics  . Smoking status: Former Smoker -- 2.00 packs/day for 37 years    Types: Cigarettes    Start date: 02/17/1949    Quit date: 03/15/1985  . Smokeless tobacco: Never Used     Comment: quit 25 years ago  . Alcohol Use: No  . Drug Use: No  . Sexual Activity: No   Other Topics Concern  . Not on file   Social History Narrative    Review of Systems: Gen: Denies any fever, chills, sweatsr CV: Denies chest pain, angina, palpitations, syncope, orthopnea, PND, peripheral edema, and claudication. Resp: Denies dyspnea at rest, dyspnea with exercise, cough, sputum, wheezing, coughing up blood, and pleurisy. GI: Denies vomiting blood, jaundice, and fecal incontinence.   Derm: Denies rash, itching, dry skin, hives, moles, warts, or unhealing ulcers.  Psych: Denies depression, anxiety, memory loss, suicidal ideation, hallucinations, paranoia, and confusion. Heme: Denies bruising, bleeding, and enlarged lymph nodes.   Physical Exam: Vital signs in last 24 hours: Temp:  [97.6 F (36.4 C)-98.1 F (36.7 C)] 98 F (36.7 C) (11/26 0651) Pulse Rate:  [76-96] 86 (11/26 0651) Resp:  [15-29] 20 (11/26 0651) BP: (97-159)/(59-91) 159/86 mmHg (11/26 0651) SpO2:  [96 %-100 %] 100 % (11/26 0651) Weight:  [207 lb 8 oz (94.121 kg)-208 lb (94.348 kg)] 207 lb 8 oz (94.121 kg) (11/25 1835) Last BM Date: 02/05/14 General:  Elderly alert, somewhat hard of hearing gentleman who appears pleasant and in no acute distress. He is accompanied by multiple family members.  Head:  Normocephalic and atraumatic. Eyes:  Sclera clear, no icterus.   Conjunctiva pink. Ears:  Normal auditory acuity. Nose:  No deformity,  discharge,  or lesions. Mouth:  No deformity or lesions, dentition normal. Neck:   Supple; no masses or thyromegaly. Lungs:  Clear throughout to auscultation.   No wheezes, crackles, or rhonchi. No acute distress. Heart:  Regular rate and rhythm; no murmurs, clicks, rubs,  or gallops. Abdomen: Nondistended. Has some infraumbilical laparotomy scar and an apparent right lower quadrant colostomy scar. He has good bowel sounds and no bruits appreciated. Abdomen is entirely soft and nontender without mass or organomegaly appreciated.   Intake/Output from previous day: 11/25 0701 - 11/26 0700 In: 783.3 [I.V.:733.3; IV Piggyback:50] Out: 500 [Urine:500] Intake/Output this shift:    Lab Results:  Recent Labs  02/06/14 1454 02/07/14 0520  WBC 4.8 3.8*  HGB 10.1* 9.2*  HCT 31.4* 28.9*  PLT 72* 65*   BMET  Recent Labs  02/06/14 1454 02/07/14 0520  NA 140 144  K 3.5* 3.7  CL 103 109  CO2 23 23  GLUCOSE 79 75  BUN 43* 40*  CREATININE 2.03* 1.84*  CALCIUM 11.1* 10.2   LFT  Recent Labs  02/07/14 0520  PROT 6.4  ALBUMIN 2.1*  AST 21  ALT <5  ALKPHOS 50  BILITOT 0.4    Studies/Results: Dg Chest Port 1 View  02/06/2014   CLINICAL DATA:  Weakness.  EXAM: PORTABLE CHEST - 1 VIEW  COMPARISON:  January 11, 2014.  FINDINGS: Stable cardiomediastinal silhouette. Stent graft is again noted projected over descending thoracic aorta. No pneumothorax or significant pleural effusion is noted. Hypoinflation of the lungs is again noted. Right-sided PICC line is again noted and unchanged. Minimal subsegmental atelectasis is noted in left lung base. Right lung is clear. Bony thorax is intact.  IMPRESSION: Hypoinflation of the lungs is noted which is unchanged compared to prior exam. Minimal left basilar subsegmental atelectasis is noted.   Electronically Signed   By: Sabino Dick M.D.   On: 02/06/2014 15:15   Impression / Recommendations: 80 year old gentleman nursing home resident admitted to the hospital with a several month history of progressive failure to  thrive-diminished oral intake and secondary dehydration. Interestingly, he's been able to swallow his medications without difficulty by report. He does have gross aspiration on a recent swallowing study.  Recent significant vascular intervention with findings noted on recent abdominal CT. Platelet count diminished-cause uncertain but may possibly be related to Plavix. No evidence of advanced chronic liver disease on recent cross-sectional imaging.  From a technical standpoint, I feel that we could place a PEG tube. I've discussed the procedure along with potential risks of bleeding and perforation and reaction to medications with [name] and multiple family members. [name] now has his hearing aids and seems to understand. All parties are interested. I specifically explained that PEG placement would not reduce the risk of aspiration or necessarily improve any of his other medical conditions. I also discussed the need to be off of Plavix for 5 days following the procedure. His platelet count will need to be rechecked.  We'll tentatively plan for PEG tube placement the first of next week. In the interim, would hold Plavix. Consider Dobbhoff tube placement for temporary nutritional support.   Notice:  This dictation was prepared with Dragon dictation along with smaller phrase technology. Any transcriptional errors that result from this process are unintentional and may not be corrected upon review.

## 2014-02-11 NOTE — Interval H&P Note (Signed)
History and Physical Interval Note:  02/11/2014 12:36 PM  [name]  has presented today for surgery, with the diagnosis of Aspiration, failure to thrive  The various methods of treatment have been discussed with the patient and family. After consideration of risks, benefits and other options for treatment, the patient has consented to  Procedure(s) with comments: ESOPHAGOGASTRODUODENOSCOPY (EGD) (N/A) - With PEG placement PERCUTANEOUS ENDOSCOPIC GASTROSTOMY (PEG) PLACEMENT (N/A) as a surgical intervention .  The patient's history has been reviewed, patient examined, no change in status, stable for surgery.  I have reviewed the patient's chart and labs.  Questions were answered to the patient's satisfaction.     Manus Rudd  Patient stable for PEG placement per plan.

## 2014-02-11 NOTE — Op Note (Addendum)
[facility] [address] [place], [address]   ENDOSCOPY PROCEDURE REPORT  PATIENT: Elijah, [name]  MR#: [id number] BIRTHDATE: Oct 18, 1929 , 84  yrs. old GENDER: male ENDOSCOPIST: R.  Garfield Cornea, MD FACP [facility] REFERRED BY:     Hospitalist PROCEDURE DATE:  02/21/14 PROCEDURE:  EGD w/ percutaneous gastrostomy tube placement INDICATIONS:  failure to thrive; dysphagia; aspiration. MEDICATIONS: Ancef 1 g IV, Versed 1 mg IV.  Cetacaine spray.  Plain Xylocaine 4 mL. ASA CLASS:      Class III  CONSENT: The risks, benefits, limitations, alternatives and imponderables have been discussed.  The potential for biopsy, esophogeal dilation, etc. have also been reviewed.  Questions have been answered.  All parties agreeable.  Please see the history and physical in the medical record for more information.  DESCRIPTION OF PROCEDURE: After the risks benefits and alternatives of the procedure were thoroughly explained, informed consent was obtained.  The EG-[id number] ([id number]) endoscope was introduced through the mouth and advanced to the second portion of the duodenum , limited by Without limitations. The instrument was slowly withdrawn as the mucosa was fully examined.    The indwelling Dobbhoff tube was removed prior to intubation of the esophagus with the scope.  Normal-appearing tubular esophagus. Stomach empty.  Mild patchy erythema.  No erosions or ulceration or infiltrating process.  Small hiatal hernia.  Patent pylorus. Normal first and second portion of the duodenum.  Scope was pulled back in the stomach stomach and it was fully inflated.  A suitable site along the medial left upper quadrants selected for PEG placement.  There was excellent transillumination of light at the location selected.  Overlying skin was prepped in sterile fashion with Betadine.  4 mL of 1% plain Xylocaine used for local infiltrative anesthesia.  Using the Microvasive trocar needle, the  abdominal wall was traversed; the gastric cavity was entered.  Guidewire was advanced through the trocar needle; it was grasped with a snare through the scope and pulled out of the patient's mouth.  Over the guidewire, a 20 [place] plastic PEG tube was advanced.  It was appropriately snug of a 4 cm mark externally. There was minimal bleeding with these maneuvers.  The internal bumper was in appropriate position and satisfactorily snugged under endoscopic visualization.  Retroflexed views revealed as previously described.     The scope was then withdrawn from the patient and the procedure completed.  COMPLICATIONS: There were no immediate complications.  ENDOSCOPIC IMPRESSION: Small hiatal hernia. Patchy erythema of the gastric mucosa; otherwise negative EGD. Status post 37 French Microvasive PEG tube placement  RECOMMENDATIONS: Water via PEG tube at 40 mL an hour -4hours; if okay in 4 hours may resume tube feedings. May use PEG tube for medications immediately.  May resume Plavix December 2 if clinically necessary.  REPEAT EXAM:  eSigned:  R. Garfield Cornea, MD [facility] 21-Feb-2014 1:30 PM Revised: 02/21/2014 1:30 PM   CC:  CPT CODES: ICD CODES:  The ICD and CPT codes recommended by this software are interpretations from the data that the clinical staff has captured with the software.  The verification of the translation of this report to the ICD and CPT codes and modifiers is the sole responsibility of the health care institution and practicing physician where this report was generated.  [employer] will not be held responsible for the validity of the ICD and CPT codes included on this report.  AMA assumes no liability for data contained or not contained  herein. CPT is a Designer, television/film set of the [employer].  PATIENT NAME:  Elijah, [name] MR#: [id number]

## 2014-02-11 NOTE — Clinical Social Work Note (Signed)
CSW spoke with Marianna Fuss at [facility] and updated her on pt. Pt to have PEG tube placement today. Yates agreeable to return when stable.  Benay Pike, [place]

## 2014-02-11 NOTE — Progress Notes (Addendum)
NUTRITION FOLLOW UP  Pt meets criteria for severe MALNUTRITION in the context of chronic illness as evidenced by wt loss of 12% in 30 days and 26% in < 1 year with energy intake </= 75% for >/=1 month  Intervention:   Initate Jevity 1.2 @ 20 ml/hr via PEG and increase by 10 ml every 8 hours to goal rate of 60 ml/hr.   Add 30 ml Prostat TID   Tube feeding regimen provides 2028 kcal (100% of estimated needs), 125 grams of protein, and 1162 ml of H2O.  Monitor magnesium, potassium, and phosphorus daily for at least 3 days, MD to replete as needed, as pt is at risk for refeeding syndrome given his prolonged hypocaloric intake   Nutrition Dx:   Inadequate oral intake related to anorexia as evidenced by 0-25% po intake past 17 days and 12% wt loss in 30 days  Goal:   Pt to meet >/= 90% of their estimated nutrition needs   Monitor:   nutrition support, labs and wt trends   Assessment:   pt from [facility]. Hx poor oral intake (0-25% of meals past 17 days) and severe weight loss over the past year (26% since February and 12% in the past month). Pt receiving Megace for appetite. Also receiving Remeron.   Pt s/p PEG placement today. Noted Panda was placed on 02/08/14. Pt was tolerating feedings well, however, feedings were disrupted on 02/10/14 due to [facility] being displaced. Previous feeding regimen prior to PEG placement was Jevity 1.2 @ 60 ml/hr, which provided 1728 kcals, 80 grams protein, and 1162 ml fluid daily (meets 86% of estimated kcal needs and 73% of estimated protein needs)  Noted a 6# (2.8%) wt gain x 3 days.  Spoke with RN who confirmed consult for TF. Per consult order, feedings to start at 1800.  Labs reviewed. BUN/Creat: 27/1.41, K: 2.9, Phos: 2.2, Mg WDL.   Height: Ht Readings from Last 1 Encounters:  02/06/14 6\' 2"  (1.88 m)    Weight Status:   Wt Readings from Last 1 Encounters:  02/11/14 218 lb (98.884 kg)   02/08/14 212 lb 1.6 oz (96.208 kg)   Re-estimated needs:   Kcal: 2000-2200 Protein: 110-120 grams Fluid: 2.0-2.2 L  Skin: ecchymosis on medial sacrum  Diet Order: Diet NPO time specified   Intake/Output Summary (Last 24 hours) at 02/11/14 1556 Last data filed at 02/11/14 7017  Gross per 24 hour  Intake      0 ml  Output    650 ml  Net   -650 ml    Last BM: 02/05/14   Labs:   Recent Labs Lab 02/07/14 1218 02/08/14 1146 02/10/14 0647 02/10/14 0652 02/11/14 0603  NA 142  --  146  --  146  K 4.0  --  2.9*  --  3.7  CL 108  --  111  --  109  CO2 23  --  25  --  27  BUN 39*  --  27*  --  26*  CREATININE 1.78*  --  1.41*  --  1.41*  CALCIUM 10.1  --  9.5  --  9.5  MG  --   --   --  1.6  --   PHOS  --  2.2*  --   --  2.2*  GLUCOSE 89  --  98  --  93    CBG (last 3)  No results for input(s): GLUCAP in the last 72 hours.  Scheduled Meds: . aspirin  81 mg Oral Daily  . ceFEPime (MAXIPIME) IV  2 g Intravenous Q24H  . doxazosin  4 mg Oral QHS  . megestrol  400 mg Oral Daily  . meperidine      . metoprolol succinate  25 mg Oral Daily  . midazolam      . mirtazapine  15 mg Oral QHS  . ondansetron      . pantoprazole  40 mg Oral Daily  . senna  2 tablet Oral QHS  . simvastatin  20 mg Oral Daily  . sodium chloride  3 mL Intravenous Q12H    Continuous Infusions: . feeding supplement (JEVITY 1.2 CAL) 1,000 mL (02/10/14 1339)    Londen Lorge A. Jimmye Norman, RD, LDN Pager: [phone] After hours Pager: [phone]

## 2014-02-11 NOTE — Plan of Care (Signed)
Problem: Phase I Progression Outcomes Goal: OOB as tolerated unless otherwise ordered Outcome: Completed/Met Date Met:  02/11/14     

## 2014-02-12 ENCOUNTER — Encounter (HOSPITAL_COMMUNITY): Payer: Self-pay | Admitting: Internal Medicine

## 2014-02-12 DIAGNOSIS — Z87898 Personal history of other specified conditions: Secondary | ICD-10-CM

## 2014-02-12 DIAGNOSIS — R627 Adult failure to thrive: Secondary | ICD-10-CM | POA: Insufficient documentation

## 2014-02-12 DIAGNOSIS — Z4659 Encounter for fitting and adjustment of other gastrointestinal appliance and device: Secondary | ICD-10-CM | POA: Insufficient documentation

## 2014-02-12 LAB — GLUCOSE, CAPILLARY
GLUCOSE-CAPILLARY: 117 mg/dL — AB (ref 70–99)
GLUCOSE-CAPILLARY: 95 mg/dL (ref 70–99)
Glucose-Capillary: 108 mg/dL — ABNORMAL HIGH (ref 70–99)
Glucose-Capillary: 108 mg/dL — ABNORMAL HIGH (ref 70–99)
Glucose-Capillary: 94 mg/dL (ref 70–99)
Glucose-Capillary: 108 mg/dL — ABNORMAL HIGH (ref 70–99)

## 2014-02-12 MED ORDER — ACETAMINOPHEN 500 MG PO TABS: 1000.0000 mg | ORAL_TABLET | Freq: Four times a day (QID) | ORAL | Status: DC | PRN

## 2014-02-12 NOTE — Progress Notes (Signed)
Patient tolerating tube feeding, zero residual noted when checked this a.m.

## 2014-02-12 NOTE — Progress Notes (Signed)
    Subjective: Hard of hearing. Denies abdominal pain. PEG tube placed yesterday. Tolerating tube feeds per nursing staff. At 23ml/h, increasing at 10 ml every 8 hours.   Objective: Vital signs in last 24 hours: Temp:  [97.5 F (36.4 C)-99.2 F (37.3 C)] 97.5 F (36.4 C) (12/01 0403) Pulse Rate:  [61-95] 90 (12/01 0403) Resp:  [15-30] 18 (12/01 0403) BP: (126-164)/(51-100) 142/69 mmHg (12/01 0403) SpO2:  [96 %-100 %] 96 % (12/01 0403) Weight:  [213 lb 3 oz (96.7 kg)] 213 lb 3 oz (96.7 kg) (12/01 0405) Last BM Date: 02/05/14 General:   Alert and oriented to person. Hard of hearing.  Abdomen:  Bowel sounds present, soft, non-tender, non-distended. Obese. PEG tube site intact, PEG bumper at 4cm. No drainage, erythema, edema Msk:  Symmetrical without gross deformities. Normal posture. Psych:  Alert and cooperative.   Intake/Output from previous day: 11/30 0701 - 12/01 0700 In: 290 [NG/GT:290] Out: 725 [Urine:725] Intake/Output this shift:    Lab Results:  Recent Labs  02/09/14 1107 02/11/14 0603  WBC 4.2 5.3  HGB 8.8* 9.2*  HCT 27.5* 28.9*  PLT 72* 81*   BMET  Recent Labs  02/10/14 0647 02/11/14 0603  NA 146 146  K 2.9* 3.7  CL 111 109  CO2 25 27  GLUCOSE 98 93  BUN 27* 26*  CREATININE 1.41* 1.41*  CALCIUM 9.5 9.5     Assessment: 80 year old male with history of failure to thrive, aspiration, requiring PEG tube for nutritional support. 29 F placed endoscopically on 11/30. Tolerating slow advancement of tube feeding. Site intact; needs bumper loosened by 1 cm, with final positioning at 5cm, on 12/2.     Plan: Resume Plavix Dec 2 if clinically necessary Tube feeds per nutrition Check residuals every 4 hours Loosen bumper on 12/2  Orvil Feil, ANP-BC [facility]      LOS: 6 days    02/12/2014, 8:14 AM

## 2014-02-12 NOTE — Evaluation (Signed)
Physical Therapy Evaluation Patient Details Name: [name] MRN: [id number] DOB: [birth year] Today's Date: 02/12/2014   History of Present Illness  This is an 80 year old man who is currently at a skilled nursing facility, [facility], and he apparently had a very short episode of syncopal episode today. He was noted to be quite dehydrated this week, confirmed on blood work. He has been very anorexic in the last week and has not eaten or drunk anything. He has been losing weight, apparently 12 pounds in the last few weeks. 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 He has been having intravenous cefepime, apparently this is going to be discontinued in December, according to the daughter at the bedside. At the nursing home, he has had very poor oral intake and has not been able to walk independently. Even with a walker, he requires tremendous help. He has had a swallowing study done and this was apparently normal. The nursing home physician, Dr. Dellia Nims, had spoken to the family about PEG tube feeding for nutritional support and the patient has agreed to this. He is now being admitted for his significant dehydration.  Clinical Impression   Pt is seen for evaluation today.  He underwent PEG tube placement yesterday and is now reporting around the surgical site.  He is able to move all major muscle groups against gravity but he is severely deconditioned.  We initiated LE strengthening exercise in the bed.  We attempted to transfer supine to sit but he developed too much soreness to complete the transfer.  For the time being, I have asked the nursing service to use the Maxi lift to transfer him out of bed to a chair.  Pt is agreeable to this.  He will definitely need to return to SNF at d/c as he will now be able to progress with increasing strength and mobility.     Follow Up Recommendations SNF    Equipment Recommendations  None recommended by PT    Recommendations for Other Services   OT    Precautions / Restrictions Precautions Precautions: Fall Precaution Comments: HOB needs to be at 30 degrees when receiving tube feeding Restrictions Weight Bearing Restrictions: No      Mobility  Bed Mobility Overal bed mobility: Needs Assistance Bed Mobility: Supine to Sit     Supine to sit: Total assist     General bed mobility comments: pt reports increased abdominal soreness when attempting to get to sitting...he did not want to continue to try to get to sitting  Transfers                    Ambulation/Gait                Stairs            Wheelchair Mobility    Modified Rankin (Stroke Patients Only)       Balance Overall balance assessment:  (unable to determine)                                           Pertinent Vitals/Pain Pain Assessment: No/denies pain (sore around PEG tube site)    Home Living Family/patient expects to be discharged to:: Skilled nursing facility  Prior Function Level of Independence: Needs assistance   Gait / Transfers Assistance Needed: Pt had been at SNF due to weakness following infection due to surgical stent and had not been ambulatory...he had been able to transfer bed to chair with assist  ADL's / Homemaking Assistance Needed: assist with bathing and dressing        Hand Dominance        Extremity/Trunk Assessment               Lower Extremity Assessment: Generalized weakness         Communication   Communication: HOH  Cognition Arousal/Alertness: Awake/alert Behavior During Therapy: WFL for tasks assessed/performed Overall Cognitive Status: Within Functional Limits for tasks assessed                      General Comments      Exercises General Exercises - Lower Extremity Ankle  Circles/Pumps: AROM;Both;10 reps;Supine Quad Sets: AROM;Both;10 reps;Supine Gluteal Sets: AROM;Both;10 reps;Supine Short Arc Quad: AROM;Both;10 reps;Supine Heel Slides: AAROM;Both;10 reps;Supine Hip ABduction/ADduction: AAROM;Both;10 reps;Supine      Assessment/Plan    PT Assessment Patient needs continued PT services  PT Diagnosis Generalized weakness   PT Problem List Decreased strength;Decreased activity tolerance;Decreased mobility  PT Treatment Interventions Functional mobility training;Therapeutic exercise;Patient/family education   PT Goals (Current goals can be found in the Care Plan section) Acute Rehab PT Goals Patient Stated Goal: none stated PT Goal Formulation: With patient Time For Goal Achievement: 02/26/14 Potential to Achieve Goals: Good    Frequency Min 3X/week   Barriers to discharge  (unknown)      Co-evaluation               End of Session   Activity Tolerance: Other (comment) (limited by soreness from PEG tube) Patient left: in bed;with call bell/phone within reach;with bed alarm set;with nursing/sitter in room Nurse Communication: Mobility status         Time: 5625-6389 PT Time Calculation (min) (ACUTE ONLY): 56 min   Charges:   PT Evaluation $Initial PT Evaluation Tier I: 1 Procedure PT Treatments $Therapeutic Exercise: 8-22 mins $Therapeutic Activity: 8-22 mins   PT G CodesSable Feil 02/12/2014, 11:53 AM

## 2014-02-12 NOTE — Progress Notes (Signed)
[facility] PROGRESS NOTE  [name] TIW:[id number] DOB: 1930/02/03 DOA: 02/06/2014 PCP: [name], MD  Brief narrative: Patient is an 80 year old presented with failure to thrive and generalized weakness. He has had poor oral intake and has not been eating all that well. As a result consideration was made for PEG tube and patient is status post PEG tube placement. GI is currently on board  Assessment/Plan:    FTT (failure to thrive) in adult - Will obtain speech therapy evaluation - Pt is s/p peg tube placement - Plavix held for PEG tube placement. Per GI note plavix may be continued on 02/13/14 - PT consulted  Hypokalemia - resolved after replacement  Severe malnutrition - Pt meets criteria for severe MALNUTRITION in the context of chronic illness as evidenced by wt loss of 12% in 30 days and 26% in < 1 year with energy intake </= 75% for >/=1 month - RD on board please see jevity recommendations - Peg tube placed: GI wants bumper loosened on 02/13/14  Active Problems:   Abdominal aortic aneurysm  - stable currently - Stent placement complicated by Pseudomonas stent infection requiring ongoing antibiotic treatment.    HTN (hypertension) - on Metoprolol currently    Thrombocytopenia - ? Etiology, but trending up - We'll plan on holding aspirin and platelet count goes below 60,000    Dehydration - Improvedwith IV fluid rehydration.  - saline lock - on tube feeds   Code Status: full Family Communication:  Discussed with daughter and son at bedside. Disposition Plan: Once cleared by GI for discharge. Most likely within the next 1-2 days   Consultants:  GI: Dr. Gala Romney  Procedures:  none  Antibiotics:  cefepime  HPI/Subjective: Pt has no new complaints. No acute issues overnight.  Objective: Filed Vitals:   02/12/14 1414  BP: 132/70  Pulse: 64  Temp: 98.4 F (36.9 C)  Resp: 20    Intake/Output Summary (Last 24 hours) at 02/12/14  1425 Last data filed at 02/12/14 0615  Gross per 24 hour  Intake    290 ml  Output    725 ml  Net   -435 ml   Filed Weights   02/10/14 0531 02/11/14 0636 02/12/14 0405  Weight: 96.314 kg (212 lb 5.3 oz) 98.884 kg (218 lb) 96.7 kg (213 lb 3 oz)    Exam:   General:  Pt in nad, alert and awake  Cardiovascular: rrr, no mrg  Respiratory: cta bl, no wheezes  Abdomen: soft, ND  Musculoskeletal: no cyanosis or clubbing   Data Reviewed: Basic Metabolic Panel:  Recent Labs Lab 02/06/14 1454 02/07/14 0520 02/07/14 1218 02/08/14 1146 02/10/14 0647 02/10/14 0652 02/11/14 0603  NA 140 144 142  --  146  --  146  K 3.5* 3.7 4.0  --  2.9*  --  3.7  CL 103 109 108  --  111  --  109  CO2 23 23 23   --  25  --  27  GLUCOSE 79 75 89  --  98  --  93  BUN 43* 40* 39*  --  27*  --  26*  CREATININE 2.03* 1.84* 1.78*  --  1.41*  --  1.41*  CALCIUM 11.1* 10.2 10.1  --  9.5  --  9.5  MG  --   --   --   --   --  1.6  --   PHOS  --   --   --  2.2*  --   --  2.2*   Liver Function Tests:  Recent Labs Lab 02/06/14 1454 02/07/14 0520  AST 13 21  ALT <5 <5  ALKPHOS 57 50  BILITOT 0.6 0.4  PROT 7.3 6.4  ALBUMIN 2.4* 2.1*    Recent Labs Lab 02/06/14 1454  LIPASE 69*   No results for input(s): AMMONIA in the last 168 hours. CBC:  Recent Labs Lab 02/06/14 1454 02/07/14 0520 02/09/14 1107 02/11/14 0603  WBC 4.8 3.8* 4.2 5.3  NEUTROABS 3.6  --   --   --   HGB 10.1* 9.2* 8.8* 9.2*  HCT 31.4* 28.9* 27.5* 28.9*  MCV 82.6 83.8 83.3 83.0  PLT 72* 65* 72* 81*   Cardiac Enzymes:  Recent Labs Lab 02/06/14 1454  TROPONINI <0.30   BNP (last 3 results) No results for input(s): PROBNP in the last 8760 hours. CBG:  Recent Labs Lab 02/11/14 2026 02/11/14 2312 02/12/14 0403 02/12/14 0803 02/12/14 1249  GLUCAP 90 117* 108* 108* 94    Recent Results (from the past 240 hour(s))  Urine culture     Status: None   Collection Time: 02/06/14  2:55 PM  Result Value Ref Range  Status   Specimen Description URINE, CATHETERIZED  Final   Special Requests NONE  Final   Culture  Setup Time   Final    02/06/2014 22:59 Performed at [place] Performed at [facility]   Final   Culture NO GROWTH Performed at [facility]   Final   Report Status 02/08/2014 FINAL  Final     Studies: No results found.  Scheduled Meds: . aspirin  81 mg Oral Daily  . ceFEPime (MAXIPIME) IV  2 g Intravenous Q24H  . doxazosin  4 mg Oral QHS  . feeding supplement (PRO-STAT SUGAR FREE 64)  30 mL Per Tube TID  . megestrol  400 mg Oral Daily  . metoprolol succinate  25 mg Oral Daily  . mirtazapine  15 mg Oral QHS  . pantoprazole  40 mg Oral Daily  . senna  2 tablet Oral QHS  . simvastatin  20 mg Oral Daily  . sodium chloride  3 mL Intravenous Q12H   Continuous Infusions: . feeding supplement (JEVITY 1.2 CAL) 1,000 mL (02/11/14 1729)     Time spent: > 35 minutes    Velvet Bathe  [facility] Pager [phone] If 7PM-7AM, please contact night-coverage at [email or web address], password [facility] 02/12/2014, 2:25 PM  LOS: 6 days

## 2014-02-13 ENCOUNTER — Inpatient Hospital Stay
Admission: RE | Admit: 2014-02-13 | Discharge: 2014-02-21 | Disposition: A | Discharge status: Nursing Home (Includes ICF) | Payer: Medicare Other | Source: Ambulatory Visit | Attending: Internal Medicine | Admitting: Internal Medicine

## 2014-02-13 LAB — GLUCOSE, CAPILLARY
GLUCOSE-CAPILLARY: 98 mg/dL (ref 70–99)
Glucose-Capillary: 107 mg/dL — ABNORMAL HIGH (ref 70–99)
Glucose-Capillary: 123 mg/dL — ABNORMAL HIGH (ref 70–99)
Glucose-Capillary: 100 mg/dL — ABNORMAL HIGH (ref 70–99)

## 2014-02-13 MED ORDER — JEVITY 1.2 CAL PO LIQD: 1000.0000 mL | ORAL | Status: DC

## 2014-02-13 MED ORDER — PRO-STAT SUGAR FREE PO LIQD: 30.0000 mL | Freq: Three times a day (TID) | ORAL | Status: DC

## 2014-02-13 MED ORDER — POLYETHYLENE GLYCOL 3350 17 G PO PACK: 17.0000 g | PACK | Freq: Every day | ORAL | Status: DC | PRN

## 2014-02-13 NOTE — Plan of Care (Signed)
Problem: Phase I Progression Outcomes Goal: Initial discharge plan identified Outcome: Completed/Met Date Met:  02/13/14 Patient will be returning to [facility] at discharge.

## 2014-02-13 NOTE — Progress Notes (Signed)
Patient with orders to be discharged back to [facility]. Discharge packet sent with patient. Patient stable. Patient transported via staff.

## 2014-02-13 NOTE — Discharge Summary (Signed)
Physician Discharge Summary  [name] QVZ:[id number] DOB: Mar 24, 1929 DOA: 02/06/2014  PCP: [name], MD  Admit date: 02/06/2014 Discharge date: 02/13/2014  Time spent: 45 minutes  Recommendations for Outpatient Follow-up:  -Will be discharged back to SNF today. -Advised to follow up with ID as scheduled for determination on length of antibiotic treatment.   Discharge Diagnoses:  Active Problems:   Abdominal aortic aneurysm   HTN (hypertension)   Thrombocytopenia   FTT (failure to thrive) in adult   Dehydration   Protein-calorie malnutrition, severe   Hypokalemia   Encounter for feeding tube placement   Failure to thrive in adult   Discharge Condition: Stable and improved  Filed Weights   02/11/14 0636 02/12/14 0405 02/13/14 0500  Weight: 98.884 kg (218 lb) 96.7 kg (213 lb 3 oz) 99.7 kg (219 lb 12.8 oz)    History of present illness:  This is an 80 year old man who is currently at a skilled nursing facility, [facility], and he apparently had a very short episode of syncopal episode today. He was noted to be quite dehydrated this week, confirmed on blood work. He has been very anorexic in the last week and has not eaten or drunk anything. He has been losing weight, apparently 12 pounds in the last few weeks. 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 He has been having intravenous cefepime, apparently this is going to be discontinued in December, according to the daughter at the bedside. At the nursing home, he has had very poor oral intake and has not been able to walk independently. Even with a walker, he requires tremendous help. He has had a swallowing study done and this was apparently normal. The nursing home physician, Dr. Dellia Nims, had spoken to the family about PEG tube feeding for nutritional support and the patient  has agreed to this. He is now being admitted for his significant dehydration.  Hospital Course:   FTT (failure to thrive) in adult - Pt is s/p peg tube placement  Hypokalemia - resolved after replacement  Severe malnutrition - Pt meets criteria for severe MALNUTRITION in the context of chronic illness as evidenced by wt loss of 12% in 30 days and 26% in < 1 year with energy intake </= 75% for >/=1 month - PEG placed and has been started on tube feeds per RD recommendations.   Abdominal aortic aneurysm - stable currently - Stent placement complicated by Pseudomonas stent infection requiring ongoing antibiotic treatment. -Continue cefepime until further assessed by [facility].   HTN (hypertension) - on Metoprolol currently -Well controlled.   Thrombocytopenia - ? Etiology, but trending up - 81000 on DC.   Dehydration - Improvedwith IV fluid rehydration and PEG placement. - saline lock - on tube feeds  Procedures:  PEG placement   Consultations:  GI  Discharge Instructions  Discharge Instructions    Increase activity slowly    Complete by:  As directed             Medication List    TAKE these medications        acetaminophen 500 MG tablet  Commonly known as:  TYLENOL  Take 1,000 mg by mouth every 6 (six) hours as needed for mild pain or moderate pain.     aspirin 81 MG chewable tablet  Chew 81 mg by mouth daily.     ceFEPIme 2 g in  dextrose 5 % 50 mL  Inject 2 g into the vein at bedtime.     clopidogrel 75 MG tablet  Commonly known as:  PLAVIX  Take 75 mg by mouth daily.     docusate sodium 100 MG capsule  Commonly known as:  COLACE  Take 100 mg by mouth daily as needed for mild constipation.     doxazosin 4 MG tablet  Commonly known as:  CARDURA  Take 4 mg by mouth at bedtime.     feeding supplement (JEVITY 1.2 CAL) Liqd  Place 1,000 mLs into feeding tube continuous.     feeding supplement (PRO-STAT SUGAR FREE 64) Liqd    Place 30 mLs into feeding tube 3 (three) times daily.     LORazepam 0.5 MG tablet  Commonly known as:  ATIVAN  Take 1/2 tablet by mouth every 8 hours as needed for anxiety     megestrol 400 MG/10ML suspension  Commonly known as:  MEGACE  Take 400 mg by mouth daily.     metoprolol succinate 25 MG 24 hr tablet  Commonly known as:  TOPROL-XL  Take 25 mg by mouth daily.     mirtazapine 15 MG tablet  Commonly known as:  REMERON  Take 15 mg by mouth at bedtime.     omeprazole 40 MG capsule  Commonly known as:  PRILOSEC  Take 40 mg by mouth daily.     polyethylene glycol packet  Commonly known as:  MIRALAX / GLYCOLAX  Take 17 g by mouth daily as needed for mild constipation.     senna 8.6 MG Tabs tablet  Commonly known as:  SENOKOT  Take 2 tablets by mouth at bedtime.     simvastatin 20 MG tablet  Commonly known as:  ZOCOR  Take 20 mg by mouth daily.     traMADol 50 MG tablet  Commonly known as:  ULTRAM  Take 50 mg by mouth every 6 (six) hours as needed for moderate pain.       Allergies  Allergen Reactions  . Atorvastatin Other (See Comments)    Causes constipation  . Penicillins Other (See Comments)    Other reaction(s): RASH HIVES       The results of significant diagnostics from this hospitalization (including imaging, microbiology, ancillary and laboratory) are listed below for reference.    Significant Diagnostic Studies: Dg Op Swallowing Func-medicare/speech Path  01/23/2014   CLINICAL DATA:  Dysphagia, gagging, personal history of hypertension, abdominal and thoracic aortic aneurysm repair  EXAM: MODIFIED BARIUM SWALLOW  TECHNIQUE: Different consistencies of barium were administered orally to the patient by the Speech Pathologist. Imaging of the pharynx was performed in the lateral projection.  FLUOROSCOPY TIME:  2 min 18 seconds  COMPARISON:  None  FINDINGS: Thin liquid- evaluated by cup and straw presentation. Premature spill over. Slightly delayed  initiation. No laryngeal penetration or aspiration. Minimal vallecular residual after a single swallow.  Nectar thick liquid- not evaluated  Honey- not evaluated  Pure- with presentation of applesauce twice, patient gag and spit out contrast and was unable to swallow.  Cracker-generalized weakness. No laryngeal penetration or aspiration. No significant residuals.  Pure with cracker- week oral phase. Premature spill over and slight delayed initiation. No laryngeal penetration or aspiration. Residuals were identified along the soft palate.  Barium tablet -  within normal limits  IMPRESSION: Swelling dysfunction as above.  Please refer to the Speech Pathologists report for complete details and recommendations.   Electronically Signed  By: Lavonia Dana M.D.   On: 01/23/2014 14:13   Dg Fluoro Rm 1-60 Min  02/04/2014   CLINICAL DATA:  Nausea, question dysphagia  EXAM: FLOURO RM 1-60 MIN  TECHNIQUE: Attempted esophagram.  Patient unable to drink thin barium by straw.  With table elevated 40 degrees, patient unable to drink by cup.  Small amount of contrast was instilled into the oral cavity with a syringe.  CONTRAST:  Thin barium  FLUOROSCOPY TIME:  1 min 12 seconds  COMPARISON:  None  FINDINGS: With a small amount of contrast instilled into oral cavity by syringe, patient demonstrated prompt aspiration of a small amount of contrast. Spontaneous cough reflex identified. Patient was unable to swallow any contrast. Procedure terminated.  IMPRESSION: Aspiration of a small amount of contrast which was instilled into the oral cavity by syringe.  Spontaneous cough reflex noted.  Unable to perform esophagram.   Electronically Signed   By: Lavonia Dana M.D.   On: 02/04/2014 11:04   Dg Chest Port 1 View  02/10/2014   CLINICAL DATA:  Doppler of tube placement  EXAM: PORTABLE CHEST - 1 VIEW  COMPARISON:  02/08/2014  FINDINGS: Cardiomediastinal silhouette is stable. Aortic stent is unchanged in position. Stable right PICC  line position. There is a NG feeding tube with tip in proximal stomach below the level of diaphragm.  IMPRESSION: NG feeding tube with tip in proximal stomach below of the level of diaphragm.   Electronically Signed   By: Lahoma Crocker M.D.   On: 02/10/2014 12:48   Dg Chest Port 1 View  02/08/2014   CLINICAL DATA:  Feeding tube placement.  EXAM: PORTABLE CHEST - 1 VIEW  COMPARISON:  02/06/2014, 01/11/2014, 07/27/2012.  FINDINGS: Nasogastric tube with tip below hemidiaphragms. Aortic stent graft. Cardiomegaly. Pulmonary vascularity is normal. No focal infiltrate. Mild basilar atelectasis stable. Elevation left hemidiaphragm. Mild right pleural effusion. No acute bony abnormality.  IMPRESSION: 1. Nasogastric tube with tip below hemidiaphragms. 2. Stable cardiomegaly.Aortic stent graft in stable position. 3. Mild basilar atelectasis.  Small right pleural effusion.   Electronically Signed   By: [place]   On: 02/08/2014 15:01   Dg Chest Port 1 View  02/06/2014   CLINICAL DATA:  Weakness.  EXAM: PORTABLE CHEST - 1 VIEW  COMPARISON:  January 11, 2014.  FINDINGS: Stable cardiomediastinal silhouette. Stent graft is again noted projected over descending thoracic aorta. No pneumothorax or significant pleural effusion is noted. Hypoinflation of the lungs is again noted. Right-sided PICC line is again noted and unchanged. Minimal subsegmental atelectasis is noted in left lung base. Right lung is clear. Bony thorax is intact.  IMPRESSION: Hypoinflation of the lungs is noted which is unchanged compared to prior exam. Minimal left basilar subsegmental atelectasis is noted.   Electronically Signed   By: Sabino Dick M.D.   On: 02/06/2014 15:15    Microbiology: Recent Results (from the past 240 hour(s))  Urine culture     Status: None   Collection Time: 02/06/14  2:55 PM  Result Value Ref Range Status   Specimen Description URINE, CATHETERIZED  Final   Special Requests NONE  Final   Culture  Setup Time   Final     02/06/2014 22:59 Performed at [place] Performed at [facility]   Final   Culture NO GROWTH Performed at [facility]   Final   Report Status 02/08/2014 FINAL  Final  Labs: Basic Metabolic Panel:  Recent Labs Lab 02/06/14 1454 02/07/14 0520 02/07/14 1218 02/08/14 1146 02/10/14 0647 02/10/14 0652 02/11/14 0603  NA 140 144 142  --  146  --  146  K 3.5* 3.7 4.0  --  2.9*  --  3.7  CL 103 109 108  --  111  --  109  CO2 23 23 23   --  25  --  27  GLUCOSE 79 75 89  --  98  --  93  BUN 43* 40* 39*  --  27*  --  26*  CREATININE 2.03* 1.84* 1.78*  --  1.41*  --  1.41*  CALCIUM 11.1* 10.2 10.1  --  9.5  --  9.5  MG  --   --   --   --   --  1.6  --   PHOS  --   --   --  2.2*  --   --  2.2*   Liver Function Tests:  Recent Labs Lab 02/06/14 1454 02/07/14 0520  AST 13 21  ALT <5 <5  ALKPHOS 57 50  BILITOT 0.6 0.4  PROT 7.3 6.4  ALBUMIN 2.4* 2.1*    Recent Labs Lab 02/06/14 1454  LIPASE 69*   No results for input(s): AMMONIA in the last 168 hours. CBC:  Recent Labs Lab 02/06/14 1454 02/07/14 0520 02/09/14 1107 02/11/14 0603  WBC 4.8 3.8* 4.2 5.3  NEUTROABS 3.6  --   --   --   HGB 10.1* 9.2* 8.8* 9.2*  HCT 31.4* 28.9* 27.5* 28.9*  MCV 82.6 83.8 83.3 83.0  PLT 72* 65* 72* 81*   Cardiac Enzymes:  Recent Labs Lab 02/06/14 1454  TROPONINI <0.30   BNP: BNP (last 3 results) No results for input(s): PROBNP in the last 8760 hours. CBG:  Recent Labs Lab 02/12/14 1754 02/12/14 2024 02/13/14 0017 02/13/14 0525 02/13/14 0734  GLUCAP 108* 95 98 107* 123*       Signed:  HERNANDEZ ACOSTA,ESTELA  [facility] Pager: [phone] 02/13/2014, 11:26 AM

## 2014-02-13 NOTE — Clinical Social Work Note (Signed)
Pt d/c today back to [facility]. Pt and facility aware and agreeable. Two of pt's daughters notified by voicemail. No answer or voicemail set up at wife's number. Pt to transfer with RN. D/C summary faxed.  Benay Pike, [place]

## 2014-02-13 NOTE — Progress Notes (Signed)
    Subjective: Denies abdominal pain, N/V. Tolerating tube feeds, now at 74ml/hour. Residuals checked serially and none noted.   Objective: Vital signs in last 24 hours: Temp:  [97.9 F (36.6 C)-98.5 F (36.9 C)] 97.9 F (36.6 C) (12/02 0500) Pulse Rate:  [64-69] 69 (12/02 0500) Resp:  [20] 20 (12/02 0500) BP: (124-162)/(60-78) 138/60 mmHg (12/02 0500) SpO2:  [96 %-100 %] 99 % (12/02 0500) Weight:  [219 lb 12.8 oz (99.7 kg)] 219 lb 12.8 oz (99.7 kg) (12/02 0500) Last BM Date: 02/05/14 General:   Alert and oriented, pleasant Abdomen:  Bowel sounds present, soft, non-tender, non-distended. PEG tube site intact, bumper at 4 cm.  Neurologic:  Alert and  oriented Psych:  Alert and cooperative. Normal mood and affect.  Intake/Output from previous day: 12/01 0701 - 12/02 0700 In: 588 [NG/GT:588] Out: 300 [Urine:300] Intake/Output this shift: Total I/O In: -  Out: 400 [Urine:400]  Lab Results:  Recent Labs  02/11/14 0603  WBC 5.3  HGB 9.2*  HCT 28.9*  PLT 81*   BMET  Recent Labs  02/11/14 0603  NA 146  K 3.7  CL 109  CO2 27  GLUCOSE 93  BUN 26*  CREATININE 1.41*  CALCIUM 9.5   Assessment: 80 year old male with history of failure to thrive, aspiration, requiring PEG tube for nutritional support. 57 F placed endoscopically on 11/30. Tolerating slow advancement of tube feeding, now at goal rate. Site intact; needs bumper loosened by 1 cm, with final positioning at 5cm, this afternoon. Will sign off after bumper adjusted.   Plan: Resume Plavix Dec 2 if clinically necessary Tube feeds per nutrition: currently at goal Will loose bumper this afternoon  Check residuals every 4 hours Signing off   Orvil Feil, ANP-BC [facility]     LOS: 7 days    02/13/2014, 7:47 AM

## 2014-02-14 ENCOUNTER — Other Ambulatory Visit: Payer: Self-pay | Admitting: *Deleted

## 2014-02-14 MED ORDER — TRAMADOL HCL 50 MG PO TABS: 50.0000 mg | ORAL_TABLET | Freq: Four times a day (QID) | ORAL | Status: DC | PRN

## 2014-02-14 MED ORDER — LORAZEPAM 0.5 MG PO TABS: ORAL_TABLET | ORAL | Status: DC

## 2014-02-14 NOTE — Telephone Encounter (Signed)
[facility] 

## 2014-02-15 ENCOUNTER — Encounter: Payer: Self-pay | Admitting: Internal Medicine

## 2014-02-15 ENCOUNTER — Non-Acute Institutional Stay (SKILLED_NURSING_FACILITY): Payer: Medicare Other | Admitting: Internal Medicine

## 2014-02-15 DIAGNOSIS — I714 Abdominal aortic aneurysm, without rupture, unspecified: Secondary | ICD-10-CM

## 2014-02-15 DIAGNOSIS — D696 Thrombocytopenia, unspecified: Secondary | ICD-10-CM

## 2014-02-15 DIAGNOSIS — I10 Essential (primary) hypertension: Secondary | ICD-10-CM

## 2014-02-15 DIAGNOSIS — R627 Adult failure to thrive: Secondary | ICD-10-CM

## 2014-02-15 NOTE — Progress Notes (Signed)
Patient ID: [name], male   DOB: 07/24/1929, 80 y.o.   MRN: [id number]  This encounter was created in error - please disregard. This encounter was created in error - please disregard. This encounter was created in error - please disregard.

## 2014-02-17 ENCOUNTER — Encounter: Payer: Self-pay | Admitting: Internal Medicine

## 2014-02-17 NOTE — Progress Notes (Signed)
Patient ID: [name], male   DOB: January 03, 1930, 80 y.o.   MRN: [id number]   This is an acute visit.  Level care skilled.  Facility [facility].  Chief complaint-acute visit status post hospitalization for failure to thrive-status post peg tube placement.  History of present illness.  Patient is an 80 year old male who has had quite a difficult course recently.  He has a history of severe malnutrition complicated with a history of abdominal aortic aneurysm with a complicated stent placement-with a Pseudomonas stent infection he is on ongoing antibiotics.  During his stay at this facility he was quite anorexic without much by mouth intake-he had a short syncopal episode before his most recent hospitalization-.  There have been discussions about PEG tube placement previous to his hospital admission the patient's condition appeared to be declining with continued poor by mouth intake which resulted in hospitalization and PEG tube placement  During hospitalization a PEG tube was placed.  His antibiotic cefepine was also continued-it appears this has been changed to Levaquin however apparently by the [facility] infectious disease provider  He also received IV fluid rehydration again with a PEG tube placement.  Currently has no acute complaints-his vital signs are stable-apparently his appetite remains quite poor.  Previous medical history  Failure to thrive status post PEG tube placement.  Abdominal aortic aneurysm with complicated graft infection.  Hypertension.  Thrombocytopenia.  Hypokalemia.  History of DVT left lower extremity.  Atrial fibrillation.  Chronic kidney disease.  CHF.  Atrial fibrillation.  Medications.  Tylenol thousand milligrams every 6 hours when necessary mild pain or moderate pain.  Aspirin 81 mg daily.  Plavix 75 mg daily.  Colace 100 mg daily when necessary.  Cardura 4 mg daily at bedtime.  PEG tube feeding supplement Jevity 1.2 Cal  thousand milliliters continuous at 60 mL an hour.  Pro-stat supplement 30 mL 3 times a day.  Ativan 0.25 mg every 8 hours when necessary.  Megace 400 mg daily.  Toprol-XL 25 mg daily.  Remeron 15 mg daily at bedtime.  Prilosec 40 mg daily.  MiraLAX when necessary daily.  Senna 2 tablets daily at bedtime.  Zocor 20 mg daily.  Tramadol 50 mg every 6 hours when necessary pain.      Family medical social history has been reviewed. Per admission note on 01/21/2014  Review of systems-this is somewhat limited secondary patient being a poor story and.  In general no complaints of fever or chills.  Skin is not complaining of any itching or rashes.  Head ears eyes nose mouth and throat-does not complain of any visual changes or sore throat.  Respiratory has not noted any significant cough or shortness of breath. Cardiac no complaints of chest pain.  GI-complicated history here but does not complain of abdominal discomfort apparently still has a poor appetite.  Musculoskeletal has significant weakness and is not complaining of joint pain.  Neurologic is not complaining of dizziness headache or further syncopal episodes.  Psych-there is some history of depression suspected here.  Physical exam.  Temperature is 97.7 pulse 62 respirations 20 blood pressure 04/02/2001.  In general this is a frail elderly male in no distress lying comfortably in bed.  His skin is warm and dry.  Eyes pupils appear reactive to light sclera and conjunctiva are clear visual acuity appears grossly intact.  Oropharynx is clear mucous membranes are moderately dry.  Chest is clear to auscultation with poor respiratory effort there is no labored breathing.  Heart is  regular irregular rate and rhythm without murmur gallop or rub he does not have significant lower extremity edema.  Abdomen is soft nontender with positive bowel sounds PEG site appears within normal limits.  Muscle skeletal has  generalized weakness but I did not note any deformities very frail does move all extremities 4.  Neurologic appears grossly intact to speech is clear no lateralizing findings.  Psyche is oriented to self is pleasant and appropriate more talkative than I have seen him before a bit more interactive.  Labs.  02/11/2014.  WBC 5.3 hemoglobin 9.2 platelets 81,000.  Sodium 146 potassium 3.7 BUN 26 creatinine 1.45.  02/07/2014.  AST 21-albumin 2.1-otherwise liver function tests within normal limits.   assessment and plan  #1-failure to thrive-status post peg placement-patient appears to have tolerated PEG placement without difficulty-he is on supplementation as well as Megace-this will have to be monitored clinically appears to be somewhat improved although continues to be in a very fragile state.  #2-history of abdominal aortic aneurysm complicated with graft infection-it appears this is antibiotic has been switched to Levaquin this is followed by infectious disease at [facility].  #3-history of atrial fibrillation this appears rate controlled he is on metoprolol is also on anticoagulation with aspirin and Plavix.  #4-hypertension-at this point appears to be stable he is on metoprolol as well as Cardura.  #5-history CHF-at this point appears to be compensated not on a diuretic I suspect with his renal issues this would be challenging although will have to be monitored.  #6 history of chronic kidney disease-this appears relatively stable will need updated labs.  #7-history of thrombocytopenia-this appears to be relatively stable platelets on most recent lab actually were out this will have to be rechecked.  #8-history of depression he is on Remeron appears been somewhat better spirits today but this will have to be monitored--he is somewhat more interactive than when I last saw him.  #9-history of anxiety he is on Ativan low-dose when necessary.  #10-history of previous DVT again he is on  anticoagulation with aspirin and Plavix.  #11-apparently some history of hypokalemia in the hospital-this apparently was replenished-update metabolic panel.  [id number] note greater than 40 minutes spent assessing patient-reviewing his chart-and coordinating and formulating a plan of care for numerous diagnoses-of note greater than 50% of time spent coordinating plan of care

## 2014-02-18 ENCOUNTER — Non-Acute Institutional Stay (SKILLED_NURSING_FACILITY): Payer: Medicare Other | Admitting: Internal Medicine

## 2014-02-18 DIAGNOSIS — R634 Abnormal weight loss: Secondary | ICD-10-CM

## 2014-02-18 DIAGNOSIS — T827XXD Infection and inflammatory reaction due to other cardiac and vascular devices, implants and grafts, subsequent encounter: Secondary | ICD-10-CM

## 2014-02-18 DIAGNOSIS — R627 Adult failure to thrive: Secondary | ICD-10-CM

## 2014-02-20 ENCOUNTER — Non-Acute Institutional Stay (SKILLED_NURSING_FACILITY): Payer: Medicare Other | Admitting: Internal Medicine

## 2014-02-20 DIAGNOSIS — R627 Adult failure to thrive: Secondary | ICD-10-CM

## 2014-02-20 DIAGNOSIS — R634 Abnormal weight loss: Secondary | ICD-10-CM

## 2014-02-20 NOTE — Progress Notes (Addendum)
Patient ID: [name], male   DOB: 30-Aug-1929, 80 y.o.   MRN: [id number]               HISTORY & PHYSICAL  DATE:  02/18/2014    FACILITY: [facility]    LEVEL OF CARE:   SNF   CHIEF COMPLAINT:  Review, status post stay at the hospital, 02/06/2014 through 02/13/2014.    HISTORY OF PRESENT ILLNESS:  This is a patient who came here with a history of having a thoracoabdominal aortic aneurysm repair in July of this year.  This became complicated by a Pseudomonas graft infection.  He had been treated with cefepime ongoing, although I see that has recently been changed to Levaquin although I am not exactly sure by whom.  This appears to have been done on 02/13/2014, presumably by Infectious Disease, and he is now on the Levaquin.    Since his arrival here and even before, he has had progressive weight loss, failure to thrive, anorexia.  The cause of this has never really been completely clear.  When he takes things orally, even before he initiates a swallow, he spits this across the room.  Interestingly, he has been able to tolerate pills without any difficulty.  We have had the discussion with the family and the patient about a PEG tube.  He developed some mild dehydration and apparently had a presyncopal spell in the facility and was admitted to hospital.  He was given IV fluids, seen by GI, and eventually had a PEG tube placed without complications.  I have tried to find the procedure report to follow up on his stomach, although I really do not see this.    A CT scan done at [facility] on 01/11/2014 showed enlarging pseudoaneurysm of the celiac access at the distal end of the celiac stent which is a part of a fenestrated thoracoabdominal endograft.  He was then, after this study, referred for his last admission to [facility] from 01/11/2014 through 01/18/2014.    As I understand things, the patient is still not eating at all.  He is tolerating the PEG tube feeding well.    CURRENT MEDICATIONS:   Medication list is reviewed.    Acetaminophen 1000 q.6.    ASA 81 q.d.    Levaquin 750 q.d.    Plavix 75 q.d.    Colace 100 daily.    Cardura 4 mg daily.    Megace 400 a day.    Lorazepam 1/2 tablet q.8.    Metoprolol XL 25 daily.    Remeron 15 mg daily.    Prilosec 40 q.d.    Senna 8.6, 2 tablets at bedtime.    MiraLAX 17 g daily.    Zocor 20 q.d.    Tramadol 50 mg q.6.    REVIEW OF SYSTEMS:   GENERAL:  The patient is not very happy, complaining bitterly about his care.   CHEST/RESPIRATORY:  No cough.  No sputum.      CARDIAC:   No chest pain.   GI:  He does not complain of abdominal pain, nausea, vomiting, or diarrhea, but still states he cannot swallow and he cannot eat.    PHYSICAL EXAMINATION:   GENERAL APPEARANCE:  The patient looks very unhappy, preferring to lie in bed rather than get up.  He is complaining bitterly about the people who "made him sit in the chair".   CHEST/RESPIRATORY:  Clear air entry bilaterally.   CARDIOVASCULAR:  CARDIAC:   Heart sounds  are normal.  Perhaps still some dehydration.   GASTROINTESTINAL:  LIVER/SPLEEN/KIDNEYS:  No liver, no spleen.  No tenderness.   ABDOMEN:  PEG tube is functioning well.    LABORATORY DATA:  Lab work from today shows:    Potassium 139, BUN 44, creatinine 1.25.    White count 4.1, hemoglobin 8.4, platelet count 90,000.    ASSESSMENT/PLAN:                        Continued weight loss, failure to thrive.  He now has a PEG tube in place.  I see really no role for the Megace today.  I will discontinue this.  This has clearly not helped.  The etiology of this is not completely clear.  As mentioned, he appears to be tolerating his PEG tube feeding well.  Being able to take medications without difficulty has always seemed a difficult issue to explain here.  Could this all be depression mediated? He is on Remeron.   Infected pseudoaneurysm.  He was switched from cefepime to Levaquin by presumably Infectious  Disease at [facility].    Celiac access pseudoaneurysm.  He was seen back at [facility] for this.  I do not think there is anything else surgically that can be done here.    History of BPH.  The history of prostate cancer does not appear to be an issue.    History of anemia and thrombocytopenia.  I am not really certain of the cause of this, although his platelet count is up to 90,000 which is higher than it was.  His hemoglobin is 8.4.  We will check this next week.    Overall, I do not see much change in this man.  He is still eating virtually nothing.   He is totally PEG tube dependent.  The cause behind this has not been clear, but was clearly of subacute to chronic onset.

## 2014-02-21 ENCOUNTER — Emergency Department (HOSPITAL_COMMUNITY): Payer: Medicare Other

## 2014-02-21 ENCOUNTER — Encounter (HOSPITAL_COMMUNITY): Payer: Self-pay | Admitting: *Deleted

## 2014-02-21 ENCOUNTER — Non-Acute Institutional Stay (SKILLED_NURSING_FACILITY): Payer: Medicare Other | Admitting: Internal Medicine

## 2014-02-21 ENCOUNTER — Inpatient Hospital Stay (HOSPITAL_COMMUNITY)
Admission: EM | Admit: 2014-02-21 | Discharge: 2014-03-01 | DRG: 314 | Disposition: A | Discharge status: Skilled Nursing Facility | Payer: Medicare Other | Attending: Internal Medicine | Admitting: Internal Medicine

## 2014-02-21 DIAGNOSIS — Z87442 Personal history of urinary calculi: Secondary | ICD-10-CM | POA: Diagnosis not present

## 2014-02-21 DIAGNOSIS — I129 Hypertensive chronic kidney disease with stage 1 through stage 4 chronic kidney disease, or unspecified chronic kidney disease: Secondary | ICD-10-CM | POA: Diagnosis present

## 2014-02-21 DIAGNOSIS — I10 Essential (primary) hypertension: Secondary | ICD-10-CM | POA: Diagnosis present

## 2014-02-21 DIAGNOSIS — Z880 Allergy status to penicillin: Secondary | ICD-10-CM | POA: Diagnosis not present

## 2014-02-21 DIAGNOSIS — Y95 Nosocomial condition: Secondary | ICD-10-CM | POA: Diagnosis not present

## 2014-02-21 DIAGNOSIS — N183 Chronic kidney disease, stage 3 unspecified: Secondary | ICD-10-CM | POA: Diagnosis present

## 2014-02-21 DIAGNOSIS — A419 Sepsis, unspecified organism: Secondary | ICD-10-CM | POA: Diagnosis present

## 2014-02-21 DIAGNOSIS — A047 Enterocolitis due to Clostridium difficile: Secondary | ICD-10-CM | POA: Diagnosis not present

## 2014-02-21 DIAGNOSIS — R339 Retention of urine, unspecified: Secondary | ICD-10-CM | POA: Diagnosis present

## 2014-02-21 DIAGNOSIS — R509 Fever, unspecified: Secondary | ICD-10-CM

## 2014-02-21 DIAGNOSIS — Z515 Encounter for palliative care: Secondary | ICD-10-CM | POA: Insufficient documentation

## 2014-02-21 DIAGNOSIS — Z931 Gastrostomy status: Secondary | ICD-10-CM | POA: Diagnosis not present

## 2014-02-21 DIAGNOSIS — D696 Thrombocytopenia, unspecified: Secondary | ICD-10-CM | POA: Diagnosis present

## 2014-02-21 DIAGNOSIS — T827XXS Infection and inflammatory reaction due to other cardiac and vascular devices, implants and grafts, sequela: Secondary | ICD-10-CM

## 2014-02-21 DIAGNOSIS — B965 Pseudomonas (aeruginosa) (mallei) (pseudomallei) as the cause of diseases classified elsewhere: Secondary | ICD-10-CM

## 2014-02-21 DIAGNOSIS — N400 Enlarged prostate without lower urinary tract symptoms: Secondary | ICD-10-CM | POA: Diagnosis present

## 2014-02-21 DIAGNOSIS — R6521 Severe sepsis with septic shock: Secondary | ICD-10-CM | POA: Diagnosis present

## 2014-02-21 DIAGNOSIS — E785 Hyperlipidemia, unspecified: Secondary | ICD-10-CM | POA: Diagnosis present

## 2014-02-21 DIAGNOSIS — I429 Cardiomyopathy, unspecified: Secondary | ICD-10-CM | POA: Diagnosis present

## 2014-02-21 DIAGNOSIS — Z923 Personal history of irradiation: Secondary | ICD-10-CM

## 2014-02-21 DIAGNOSIS — K829 Disease of gallbladder, unspecified: Secondary | ICD-10-CM

## 2014-02-21 DIAGNOSIS — Z66 Do not resuscitate: Secondary | ICD-10-CM | POA: Diagnosis present

## 2014-02-21 DIAGNOSIS — J690 Pneumonitis due to inhalation of food and vomit: Secondary | ICD-10-CM | POA: Diagnosis not present

## 2014-02-21 DIAGNOSIS — Z87891 Personal history of nicotine dependence: Secondary | ICD-10-CM

## 2014-02-21 DIAGNOSIS — R627 Adult failure to thrive: Secondary | ICD-10-CM | POA: Diagnosis present

## 2014-02-21 DIAGNOSIS — R310 Gross hematuria: Secondary | ICD-10-CM | POA: Diagnosis present

## 2014-02-21 DIAGNOSIS — B964 Proteus (mirabilis) (morganii) as the cause of diseases classified elsewhere: Secondary | ICD-10-CM | POA: Diagnosis present

## 2014-02-21 DIAGNOSIS — R7881 Bacteremia: Secondary | ICD-10-CM

## 2014-02-21 DIAGNOSIS — I714 Abdominal aortic aneurysm, without rupture, unspecified: Secondary | ICD-10-CM | POA: Insufficient documentation

## 2014-02-21 DIAGNOSIS — Z8546 Personal history of malignant neoplasm of prostate: Secondary | ICD-10-CM

## 2014-02-21 DIAGNOSIS — F0390 Unspecified dementia without behavioral disturbance: Secondary | ICD-10-CM | POA: Diagnosis present

## 2014-02-21 DIAGNOSIS — Z96653 Presence of artificial knee joint, bilateral: Secondary | ICD-10-CM | POA: Diagnosis present

## 2014-02-21 DIAGNOSIS — Z86718 Personal history of other venous thrombosis and embolism: Secondary | ICD-10-CM | POA: Diagnosis not present

## 2014-02-21 DIAGNOSIS — Z7982 Long term (current) use of aspirin: Secondary | ICD-10-CM | POA: Diagnosis not present

## 2014-02-21 DIAGNOSIS — A498 Other bacterial infections of unspecified site: Secondary | ICD-10-CM | POA: Insufficient documentation

## 2014-02-21 DIAGNOSIS — T827XXA Infection and inflammatory reaction due to other cardiac and vascular devices, implants and grafts, initial encounter: Secondary | ICD-10-CM | POA: Diagnosis present

## 2014-02-21 DIAGNOSIS — I5033 Acute on chronic diastolic (congestive) heart failure: Secondary | ICD-10-CM | POA: Diagnosis present

## 2014-02-21 DIAGNOSIS — I5043 Acute on chronic combined systolic (congestive) and diastolic (congestive) heart failure: Secondary | ICD-10-CM | POA: Diagnosis present

## 2014-02-21 DIAGNOSIS — Z888 Allergy status to other drugs, medicaments and biological substances status: Secondary | ICD-10-CM

## 2014-02-21 DIAGNOSIS — R1310 Dysphagia, unspecified: Secondary | ICD-10-CM | POA: Diagnosis present

## 2014-02-21 DIAGNOSIS — Z79899 Other long term (current) drug therapy: Secondary | ICD-10-CM

## 2014-02-21 DIAGNOSIS — D62 Acute posthemorrhagic anemia: Secondary | ICD-10-CM | POA: Diagnosis not present

## 2014-02-21 DIAGNOSIS — Y832 Surgical operation with anastomosis, bypass or graft as the cause of abnormal reaction of the patient, or of later complication, without mention of misadventure at the time of the procedure: Secondary | ICD-10-CM | POA: Diagnosis present

## 2014-02-21 DIAGNOSIS — E43 Unspecified severe protein-calorie malnutrition: Secondary | ICD-10-CM | POA: Diagnosis present

## 2014-02-21 DIAGNOSIS — Z7189 Other specified counseling: Secondary | ICD-10-CM

## 2014-02-21 DIAGNOSIS — A0472 Enterocolitis due to Clostridium difficile, not specified as recurrent: Secondary | ICD-10-CM

## 2014-02-21 DIAGNOSIS — R5081 Fever presenting with conditions classified elsewhere: Secondary | ICD-10-CM

## 2014-02-21 DIAGNOSIS — J189 Pneumonia, unspecified organism: Secondary | ICD-10-CM | POA: Diagnosis present

## 2014-02-21 HISTORY — DX: Gastrostomy status: Z93.1

## 2014-02-21 HISTORY — DX: Dysphagia, unspecified: R13.10

## 2014-02-21 HISTORY — DX: Bacteremia: R78.81

## 2014-02-21 HISTORY — DX: Infection and inflammatory reaction due to other cardiac and vascular devices, implants and grafts, initial encounter: T82.7XXA

## 2014-02-21 HISTORY — DX: Pseudomonas (aeruginosa) (mallei) (pseudomallei) as the cause of diseases classified elsewhere: B96.5

## 2014-02-21 LAB — COMPREHENSIVE METABOLIC PANEL
ALBUMIN: 2.5 g/dL — AB (ref 3.5–5.2)
ALT: 13 U/L (ref 0–53)
AST: 26 U/L (ref 0–37)
Alkaline Phosphatase: 69 U/L (ref 39–117)
Anion gap: 16 — ABNORMAL HIGH (ref 5–15)
BILIRUBIN TOTAL: 0.6 mg/dL (ref 0.3–1.2)
BUN: 41 mg/dL — AB (ref 6–23)
CHLORIDE: 100 meq/L (ref 96–112)
CO2: 23 mEq/L (ref 19–32)
Calcium: 9.5 mg/dL (ref 8.4–10.5)
Creatinine, Ser: 1.38 mg/dL — ABNORMAL HIGH (ref 0.50–1.35)
GFR calc non Af Amer: 46 mL/min — ABNORMAL LOW (ref >90)
GFR, EST AFRICAN AMERICAN: 53 mL/min — AB (ref >90)
GLUCOSE: 91 mg/dL (ref 70–99)
POTASSIUM: 4.3 meq/L (ref 3.7–5.3)
Sodium: 139 mEq/L (ref 137–147)
Total Protein: 7 g/dL (ref 6.0–8.3)

## 2014-02-21 LAB — CBC WITH DIFFERENTIAL/PLATELET
Basophils Absolute: 0 10*3/uL (ref 0.0–0.1)
Basophils Relative: 0 % (ref 0–1)
Eosinophils Absolute: 0 10*3/uL (ref 0.0–0.7)
Eosinophils Relative: 1 % (ref 0–5)
HCT: 29.5 % — ABNORMAL LOW (ref 39.0–52.0)
HEMOGLOBIN: 9.5 g/dL — AB (ref 13.0–17.0)
LYMPHS PCT: 8 % — AB (ref 12–46)
Lymphs Abs: 0.3 10*3/uL — ABNORMAL LOW (ref 0.7–4.0)
MCH: 26.6 pg (ref 26.0–34.0)
MCHC: 32.2 g/dL (ref 30.0–36.0)
MCV: 82.6 fL (ref 78.0–100.0)
MONOS PCT: 2 % — AB (ref 3–12)
Monocytes Absolute: 0.1 10*3/uL (ref 0.1–1.0)
NEUTROS ABS: 3 10*3/uL (ref 1.7–7.7)
Neutrophils Relative %: 90 % — ABNORMAL HIGH (ref 43–77)
Platelets: 103 10*3/uL — ABNORMAL LOW (ref 150–400)
RBC: 3.57 MIL/uL — ABNORMAL LOW (ref 4.22–5.81)
RDW: 19.9 % — ABNORMAL HIGH (ref 11.5–15.5)
WBC: 3.4 10*3/uL — AB (ref 4.0–10.5)

## 2014-02-21 LAB — URINALYSIS, ROUTINE W REFLEX MICROSCOPIC
BILIRUBIN URINE: NEGATIVE
Glucose, UA: NEGATIVE mg/dL
KETONES UR: NEGATIVE mg/dL
Leukocytes, UA: NEGATIVE
NITRITE: NEGATIVE
PH: 7 (ref 5.0–8.0)
Protein, ur: 30 mg/dL — AB
Specific Gravity, Urine: 1.015 (ref 1.005–1.030)
Urobilinogen, UA: 0.2 mg/dL (ref 0.0–1.0)

## 2014-02-21 LAB — MRSA PCR SCREENING: MRSA BY PCR: NEGATIVE

## 2014-02-21 LAB — URINE MICROSCOPIC-ADD ON

## 2014-02-21 LAB — LACTIC ACID, PLASMA
Lactic Acid, Venous: 3.2 mmol/L — ABNORMAL HIGH (ref 0.5–2.2)
Lactic Acid, Venous: 4 mmol/L — ABNORMAL HIGH (ref 0.5–2.2)

## 2014-02-21 MED ORDER — ACETAMINOPHEN 500 MG PO TABS
1000.0000 mg | ORAL_TABLET | Freq: Once | ORAL | Status: AC
  Administered 2014-02-21: 1000 mg via ORAL
  Filled 2014-02-21: qty 2

## 2014-02-21 MED ORDER — LEVOFLOXACIN IN D5W 750 MG/150ML IV SOLN
750.0000 mg | Freq: Once | INTRAVENOUS | Status: AC
  Administered 2014-02-21: 750 mg via INTRAVENOUS
  Filled 2014-02-21: qty 150

## 2014-02-21 MED ORDER — MORPHINE SULFATE 2 MG/ML IJ SOLN
1.0000 mg | INTRAMUSCULAR | Status: DC | PRN
  Administered 2014-02-26: 1 mg via INTRAVENOUS
  Filled 2014-02-21: qty 1

## 2014-02-21 MED ORDER — JEVITY 1.2 CAL PO LIQD
1000.0000 mL | ORAL | Status: DC
  Administered 2014-02-22 – 2014-03-01 (×9): 1000 mL
  Filled 2014-02-21 (×11): qty 1000

## 2014-02-21 MED ORDER — DEXTROSE 5 % IV SOLN
2.0000 g | Freq: Three times a day (TID) | INTRAVENOUS | Status: DC
  Administered 2014-02-21 – 2014-02-22 (×2): 2 g via INTRAVENOUS
  Filled 2014-02-21 (×4): qty 2

## 2014-02-21 MED ORDER — ACETAMINOPHEN 160 MG/5ML PO SOLN
650.0000 mg | Freq: Four times a day (QID) | ORAL | Status: DC | PRN
  Administered 2014-02-25: 650 mg
  Filled 2014-02-21: qty 20.3

## 2014-02-21 MED ORDER — SODIUM CHLORIDE 0.9 % IV BOLUS (SEPSIS)
1000.0000 mL | Freq: Once | INTRAVENOUS | Status: AC
  Administered 2014-02-21: 1000 mL via INTRAVENOUS

## 2014-02-21 MED ORDER — ALBUTEROL SULFATE (2.5 MG/3ML) 0.083% IN NEBU: 2.5000 mg | INHALATION_SOLUTION | RESPIRATORY_TRACT | Status: DC | PRN

## 2014-02-21 MED ORDER — POLYETHYLENE GLYCOL 3350 17 G PO PACK
17.0000 g | PACK | Freq: Every day | ORAL | Status: DC
Start: 2014-02-21 — End: 2014-03-02
  Administered 2014-02-22 – 2014-03-01 (×6): 17 g
  Filled 2014-02-21 (×8): qty 1

## 2014-02-21 MED ORDER — VANCOMYCIN HCL IN DEXTROSE 750-5 MG/150ML-% IV SOLN
750.0000 mg | Freq: Two times a day (BID) | INTRAVENOUS | Status: DC
  Administered 2014-02-22 – 2014-02-24 (×5): 750 mg via INTRAVENOUS
  Filled 2014-02-21 (×11): qty 150

## 2014-02-21 MED ORDER — LORAZEPAM 2 MG/ML PO CONC: 0.2500 mg | Freq: Four times a day (QID) | ORAL | Status: DC | PRN

## 2014-02-21 MED ORDER — VANCOMYCIN HCL IN DEXTROSE 1-5 GM/200ML-% IV SOLN: 1000.0000 mg | Freq: Once | INTRAVENOUS | Status: DC

## 2014-02-21 MED ORDER — PANTOPRAZOLE SODIUM 40 MG PO PACK
40.0000 mg | PACK | Freq: Every day | ORAL | Status: DC
  Administered 2014-02-22 – 2014-03-01 (×8): 40 mg
  Filled 2014-02-21 (×11): qty 20

## 2014-02-21 MED ORDER — SODIUM CHLORIDE 0.9 % IV SOLN
INTRAVENOUS | Status: DC
  Administered 2014-02-21: 1000 mL via INTRAVENOUS
  Administered 2014-02-21 – 2014-02-23 (×3): via INTRAVENOUS

## 2014-02-21 MED ORDER — ONDANSETRON HCL 4 MG/2ML IJ SOLN: 4.0000 mg | Freq: Four times a day (QID) | INTRAMUSCULAR | Status: DC | PRN

## 2014-02-21 MED ORDER — ONDANSETRON HCL 4 MG PO TABS: 4.0000 mg | ORAL_TABLET | Freq: Four times a day (QID) | ORAL | Status: DC | PRN

## 2014-02-21 NOTE — Consult Note (Addendum)
[facility] Medical Consultation  [name] ZOX:[id number] DOB: October 16, 1929 DOA: 02/21/2014 PCP: [name], MD   Requesting physician: ED physician Nat Christen, M.D. Date of consultation: 02/21/14 Reason for consultation: Sepsis  Impression/Recommendations Principal Problem:   Septic shock Active Problems:   Gross hematuria   Bacteremia due to Pseudomonas   Infected aortic graft   Stage III chronic kidney disease   Dysphagia   Status post insertion of percutaneous endoscopic gastrostomy (PEG) tube    1. 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 The patient is status post thoracoabdominal aortic aneurysm repair in July 2015. This was complicated by a Pseudomonas graft infection. Apparently he had been treated with cefepime via PICC line for several weeks. He was evaluated by ID physician at [facility], Dr.? Lin yesterday. Apparently, the PICC line was discontinued and he was started on daily Levaquin via PEG tube for suppression of infection indefinitely. He returns to [facility] ED from the [facility] with a blood pressure in the 81X systolically and a temperature of 103.3. It is likely that he has recurrent Pseudomonas bacteremia. The patient is at high risk of decompensation. He is at high risk of continued seeding of his bacteremia.  Given his full CODE STATUS and the request that the patient be transferred back to [facility] for continuity of care, I am in agreement with this. He will need to be treated at a tertiary care center by individuals who know him best, particularly by the infectious diseases physicians there. The family is in agreement and actually  requested that the patient be transferred to [facility] for ongoing treatment. CODE STATUS was discussed. I gently recommended a DO NOT RESUSCITATE status, but neither the patient nor his family wanted a DO NOT RESUSCITATE status, even though I believe his  prognosis is very poor. This was discussed with ED physician Dr. Lacinda Axon who will try to arrange for transfer from the ED. Would recommend IV cefepime and added vancomycin. Recommend aggressive volume resuscitation and central line placement just in case he needs pressors. Recommend blood cultures 2 which were already ordered. 2. Dysphagia, status post recent PEG tube insertion during previous hospitalization 02/11/14 by Dr. Gala Romney. 3. Thrombocytopenia. This appears to be stable and slightly improved compared to previous hospitalization. Etiology could be infection and/or antiplatelet therapy. We'll continue to monitor. 4. Renal insufficiency, likely chronic kidney disease stage III. Per chart review, creatinine ranges from 1.5-2.0. 5. Chronic anemia. Etiology likely multifactorial including multiple venipunctures, chronic disease, and acute disease. Also consider recent hematuria. 6. Recurrent gross hematuria. Blood is noted in the Foley catheter. This was reported during previous hospitalizations. He has a history of kidney stones. His urinalysis is not impressively infected. Would recommend inpatient versus outpatient urology evaluation. Consider holding Plavix and aspirin until hematuria clears. 7. Overall, general failure to thrive, severe malnutrition. Patient was recently hospitalized last week for the same. Overall, his prognosis is poor, but he and his family still want him to be a full code    Chief Complaint: Low blood pressure at the skilled nursing facility.  HPI:  The patient is an 80 year old man who temporarily resides at the [facility] for rehabilitation. He had a thoracoabdominal aortic aneurysm repair in July of this year at [facility]. Hospital course was complicated by a Pseudomonas graft infection. He had been receiving IV cefepime for several months. He was also recently hospitalized at [facility] for progressive failure to thrive,  dysphagia, anorexia, severe  malnutrition, hypokalemia, and dehydration. He was treated and sent back to the [facility]. The patient was evaluated/seen by infectious diseases physician, Dr. Vista Deck at [facility] in the office. Per her note reviewed, Levaquin was prescribed enterally indefinitely for the infection; palliative care consultation/discussion was mentioned; and she stated that the patient had recurrent Pseudomonas bacteremia due to inoperable vascular graft infection. Per the history provided by the patient's daughter, Ms. Laurance Flatten he had severe rigors and a fever of 102 at the [facility] this morning. Currently, he is laying in bed and without complaints except for some low back pain which he has had on and off chronically. In the ED, his initial vital signs were significant for a blood pressure of 79/53 and temperature of 103.3. His lab data were significant for a WBC of 3.4, hemoglobin of 9.5, platelet count of 103, BUN of 41, and creatinine of 1.38. Urinalysis reveals 3 to 6W BCs and 0-2 RBCs (prior to Foley catheter insertion). Blood cultures ordered and pending.   Review of Systems:  As above in history present illness. In addition, he has had overall general anorexia and has not been eating; generalized failure to thrive; generalized weakness; etc.  Past Medical History  Diagnosis Date  . Hypertension   . BPH (benign prostatic hyperplasia)   . Hyperlipidemia   . Cancer 2008-2009    prostate TREATED WITH RADIATION  . AAA (abdominal aortic aneurysm) 04/2012    STENTING OF AAA IN [place]  . Junctional cardiac arrhythmia     Occurred postoperatively after urologic surgery  . Severe sinus bradycardia     Occurred postoperatively after urologic surgery  . Aortic valve disorders   . Other primary cardiomyopathies   . Dysphagia   . Status post insertion of percutaneous endoscopic gastrostomy (PEG) tube 02/11/14  . Bacteremia due to Pseudomonas   . Infected aortic graft    Past Surgical History   Procedure Laterality Date  . Abdominal surgery      PART OF COLON REMOVED FOR DIVERTICULITIS  . Appendectomy    . Hernia repair    . Bladder surgery  2008    FOR BLADDER STONE  . Total knee arthroplasty  1990    left  . Abdominal aortic aneurysm repair  04/2012  . Total knee arthroplasty Right 07/26/2012    Procedure: RIGHT TOTAL KNEE ARTHROPLASTY;  Surgeon: Tobi Bastos, MD;  Location: [facility] OR[facility];  Service: Orthopedics;  Laterality: Right;  . Nephrolithotomy Left 04/26/2013    Procedure: LEFT PERCUTANEOUS NEPHROLITHOTOMY ;  Surgeon: [name], MD;  Location: [facility];  Service: Urology;  Laterality: Left;  . Nephrolithotomy Left 05/03/2013    Procedure: 2ND STAGE LEFT PERCUTANEOUS NEPHROLITHOTOMY ;  Surgeon: [name], MD;  Location: [facility];  Service: Urology;  Laterality: Left;  . Esophagogastroduodenoscopy N/A 02/11/2014    Procedure: ESOPHAGOGASTRODUODENOSCOPY (EGD);  Surgeon: Daneil Dolin, MD;  Location: [facility];  Service: Endoscopy;  Laterality: N/A;  With PEG placement  . Peg placement N/A 02/11/2014    Procedure: PERCUTANEOUS ENDOSCOPIC GASTROSTOMY (PEG) PLACEMENT;  Surgeon: Daneil Dolin, MD;  Location: [facility];  Service: Endoscopy;  Laterality: N/A;   Social History:  reports that he quit smoking about 28 years ago. His smoking use included Cigarettes. He started smoking about 65 years ago. He has a 74 pack-year smoking history. He has never used smokeless tobacco. He reports that he does not drink alcohol or use illicit drugs.  Allergies  Allergen Reactions  . Atorvastatin Other (See Comments)    Causes constipation  . Penicillins Other (See Comments)    Other reaction(s): RASH HIVES    Family History  Problem Relation Age of Onset  . Lung cancer Father     Prior to Admission medications   Medication Sig Start Date End Date Taking? Authorizing Provider  acetaminophen (TYLENOL) 500 MG tablet Take 1,000 mg by mouth every 6 (six) hours as needed for mild  pain or moderate pain.    Historical Provider, MD  Amino Acids-Protein Hydrolys (FEEDING SUPPLEMENT, PRO-STAT SUGAR FREE 64,) LIQD Place 30 mLs into feeding tube 3 (three) times daily. 02/13/14   Erline Hau, MD  aspirin 81 MG chewable tablet Chew 81 mg by mouth daily.  10/11/13 10/11/14  Historical Provider, MD  ceFEPIme 2 g in dextrose 5 % 50 mL Inject 2 g into the vein at bedtime.  01/10/14 02/26/14  Historical Provider, MD  clopidogrel (PLAVIX) 75 MG tablet Take 75 mg by mouth daily. 01/10/14 01/10/15  Historical Provider, MD  docusate sodium (COLACE) 100 MG capsule Take 100 mg by mouth daily as needed for mild constipation.    Historical Provider, MD  doxazosin (CARDURA) 4 MG tablet Take 4 mg by mouth at bedtime.    Historical Provider, MD  LORazepam (ATIVAN) 0.5 MG tablet Take 1/2 tablet by mouth every 8 hours as needed for anxiety 02/14/14   Tiffany L Reed, DO  megestrol (MEGACE) 400 MG/10ML suspension Take 400 mg by mouth daily.    Historical Provider, MD  metoprolol succinate (TOPROL-XL) 25 MG 24 hr tablet Take 25 mg by mouth daily.    Historical Provider, MD  mirtazapine (REMERON) 15 MG tablet Take 15 mg by mouth at bedtime.    Historical Provider, MD  Nutritional Supplements (FEEDING SUPPLEMENT, JEVITY 1.2 CAL,) LIQD Place 1,000 mLs into feeding tube continuous. 02/13/14   Erline Hau, MD  omeprazole (PRILOSEC) 40 MG capsule Take 40 mg by mouth daily.    Historical Provider, MD  polyethylene glycol (MIRALAX / GLYCOLAX) packet Take 17 g by mouth daily as needed for mild constipation.     Historical Provider, MD  senna (SENOKOT) 8.6 MG TABS tablet Take 2 tablets by mouth at bedtime.    Historical Provider, MD  simvastatin (ZOCOR) 20 MG tablet Take 20 mg by mouth daily.    Historical Provider, MD  traMADol (ULTRAM) 50 MG tablet Take 1 tablet (50 mg total) by mouth every 6 (six) hours as needed for moderate pain. 02/14/14   Gayland Curry, DO   Physical Exam: Blood  pressure 99/62, pulse 93, temperature 101.7 F (38.7 C), temperature source Rectal, resp. rate 18, height 6\' 2"  (1.88 m), weight 99.338 kg (219 lb), SpO2 97 %. Filed Vitals:   02/21/14 1454  BP: 99/62  Pulse: 93  Temp:   Resp: 18   temperature 103.3. Oxygen saturation 99% on supplemental oxygen.   General:  Large framed debilitated appearing 80 year old man laying in bed, in no acute distress.  Eyes: Pupils equal, round, and reactive to light. Conjunctivae are clear, sclerae white. Extra ocular movements are intact.  ENT: Oropharynx reveals mildly dry mucous membranes.  Neck: Supple, no adenopathy, no thyromegaly, no JVD.  Cardiovascular: S1, S2, with a harsh 3/6 systolic murmur.  Respiratory: globally decreased breath sounds, with no significant audible wheezes or crackles.  Abdomen: PEG tube in place, positive bowel sounds, soft, nontender, nondistended.  GU with bloody urine  in the Foley catheter.  Skin: no petechiae or rash noted. No thrombi or embolic signs of his fingertips.  Musculoskeletal: no acute hot red joints.  Psychiatric: he is lethargic, but becomes alert and oriented to himself and his daughter.  Neurologic: initially lethargic, but oriented to himself and his daughter. He follow small commands.  Labs on Admission:  Basic Metabolic Panel:  Recent Labs Lab 02/21/14 1127  NA 139  K 4.3  CL 100  CO2 23  GLUCOSE 91  BUN 41*  CREATININE 1.38*  CALCIUM 9.5   Liver Function Tests:  Recent Labs Lab 02/21/14 1127  AST 26  ALT 13  ALKPHOS 69  BILITOT 0.6  PROT 7.0  ALBUMIN 2.5*   No results for input(s): LIPASE, AMYLASE in the last 168 hours. No results for input(s): AMMONIA in the last 168 hours. CBC:  Recent Labs Lab 02/21/14 1127  WBC 3.4*  NEUTROABS 3.0  HGB 9.5*  HCT 29.5*  MCV 82.6  PLT 103*   Cardiac Enzymes: No results for input(s): CKTOTAL, CKMB, CKMBINDEX, TROPONINI in the last 168 hours. BNP: Invalid input(s):  POCBNP CBG: No results for input(s): GLUCAP in the last 168 hours.  Radiological Exams on Admission: Dg Chest Port 1 View  (if Code Sepsis Called)  02/21/2014   CLINICAL DATA:  80 year old male with fever of 103. PICC line removed yesterday. Current history of recurrent Pseudomonas bacteremia. Suspected sepsis. Initial encounter.  EXAM: PORTABLE CHEST - 1 VIEW  COMPARISON:  02/10/2014 and earlier.  FINDINGS: Portable AP upright view at 1137 hrs. Improved lung volumes. The patient is mildly rotated to the left. A chronic descending thoracic aorta endograft appears grossly stable. Stable cardiac size and mediastinal contours. No pneumothorax or pulmonary edema. Chronic hypo ventilation at the left lung base, Stable from recent comparisons. No definite effusion or consolidation. No acute osseous abnormality identified.  IMPRESSION: No acute cardiopulmonary abnormality. Chronic hypoventilation at the left lung base, stable from recent comparisons.   Electronically Signed   By: Lars Pinks M.D.   On: 02/21/2014 11:52    EKG: Independently reviewed.   Time spent: 45 minutes  [facility] Pager [phone]  If 7PM-7AM, please contact night-coverage [email or web address] Password [email or web address] 02/21/2014, 2:57 PM

## 2014-02-21 NOTE — ED Notes (Signed)
Patient is from [facility]: [name], complaint of fever, 103.3 rectally; no n/v/d; PICC line removed yesterday from right arm.

## 2014-02-21 NOTE — Progress Notes (Signed)
ADDENDUM:  The initial plan was to transfer the patient to [facility] for treatment of recurrent Pseudomonas bacteremia, per family request and for continuity of care. However, in the interim, the family has decided that they would like him transferred to [facility] instead. They are in favor of a facility with ID physicians in-house. ED physician, Dr. Lacinda Axon called intensivist, Dr. Chase Caller at [facility]. Apparently, he did not believe the patient needed ICU/intensivist care at this time (as reported by Dr. Lacinda Axon). Therefore, arrangements have been made for the patient to be transferred to the stepdown unit at [facility]. I discussed the patient with my colleague, hospitalist, Dr. Karleen Hampshire who will be the accepting physician. Currently, the patient is without complaints upon reevaluation. His blood pressure has been consistently in the low to mid 90s for the past several hours, status post 2-1/2 L of IV fluids.  Please see previous consult note. In addition, -Recommend ID consultation. -Continue vigorous IV fluids. -Recommend monitoring of his hematuria, Plavix and aspirin are on hold. -We'll order CBC every 6 hours for 24 hours. If his hemoglobin falls significantly, recommend packed red blood cell transfusion. Will order an anemia panel for tomorrow morning. -PEG feedings are being resumed; most of his chronic medications are being held. He should be reevaluated for resuming some of his chronic medications.

## 2014-02-21 NOTE — Progress Notes (Signed)
[facility] progress note. Chief complaint. Transfer note. History of present illness. This 80 year old male was admitted through [facility]. The patient has a history of thoracicoabdominal aneurysm repair in July. Complicated by Pseudomonas graft infection. Patient admitted with septic shock and recurrent Pseudomonas bacteremia from the infected aortic graft. Patient was felt to require transfer to [facility] and is now arrived. I'm seeing the patient at bedside to ensure he remains clinically stable and that his orders transferred appropriately. Vital signs. Temperature 97.8, pulse 77, respiration 26, blood pressure 105/53. O2 sats 100%. General appearance. Frail elderly male who is alert and in no distress. Cardiac. Rate and rhythm regular. Lungs. Breath sounds are equal. Abdomen. Soft with positive bowel sounds. Impression/plan. Problem #1. Septic shock. This due to Pseudomonas bacteremia from infected aortic graft. Apparently the patient and family wish transfer back to [facility] at this is still under process. The patient now in step down at [facility] for closer monitoring. Blood cultures obtained. Problem #2. Dysphagia. Patient status post PEG tube placement during recent hospitalization. Problem #3. Thrombocytopenia. Stable/improved. Patient appears clinically stable post transfer. All orders appear transferred appropriately.

## 2014-02-21 NOTE — ED Provider Notes (Addendum)
CSN: [id number]     Arrival date & time 02/21/14  1115 History  This chart was scribed for Nat Christen, MD by Stephania Fragmin, ED Scribe. This patient was seen in room APA01/APA01 and the patient's care was started at 11:28 AM.    Chief Complaint  Patient presents with  . Fever   The history is provided by the nursing home. The history is limited by the condition of the patient. No language interpreter was used.    LEVEL 5 CAVEAT DUE TO DEMENTIA  HPI Comments: [name] is a 80 y.o. male who presents to the Emergency Department from nursing home with fever and tachycardia.. The facility suspected sepsis. He had a PICC line removed yesterday. 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 He also has a feeding tube on his abdomen. A consultation yesterday recommended Levaquin 750 mg daily for suppression of vascular infection. Also palliative care was suggested.  Past Medical History  Diagnosis Date  . Hypertension   . BPH (benign prostatic hyperplasia)   . Hyperlipidemia   . Cancer 2008-2009    prostate TREATED WITH RADIATION  . AAA (abdominal aortic aneurysm) 04/2012    STENTING OF AAA IN [place]  . Junctional cardiac arrhythmia     Occurred postoperatively after urologic surgery  . Severe sinus bradycardia     Occurred postoperatively after urologic surgery  . Aortic valve disorders   . Other primary cardiomyopathies   . Dysphagia   . Status post insertion of percutaneous endoscopic gastrostomy (PEG) tube 02/11/14  . Bacteremia due to Pseudomonas   . Infected aortic graft    Past Surgical History  Procedure Laterality Date  . Abdominal surgery      PART OF COLON REMOVED FOR DIVERTICULITIS  . Appendectomy    . Hernia repair    . Bladder surgery  2008    FOR BLADDER STONE  . Total knee arthroplasty  1990    left  . Abdominal aortic aneurysm repair  04/2012   . Total knee arthroplasty Right 07/26/2012    Procedure: RIGHT TOTAL KNEE ARTHROPLASTY;  Surgeon: Tobi Bastos, MD;  Location: [facility];  Service: Orthopedics;  Laterality: Right;  . Nephrolithotomy Left 04/26/2013    Procedure: LEFT PERCUTANEOUS NEPHROLITHOTOMY ;  Surgeon: [name], MD;  Location: [facility];  Service: Urology;  Laterality: Left;  . Nephrolithotomy Left 05/03/2013    Procedure: 2ND STAGE LEFT PERCUTANEOUS NEPHROLITHOTOMY ;  Surgeon: [name], MD;  Location: [facility];  Service: Urology;  Laterality: Left;  . Esophagogastroduodenoscopy N/A 02/11/2014    Procedure: ESOPHAGOGASTRODUODENOSCOPY (EGD);  Surgeon: Daneil Dolin, MD;  Location: [facility];  Service: Endoscopy;  Laterality: N/A;  With PEG placement  . Peg placement N/A 02/11/2014    Procedure: PERCUTANEOUS ENDOSCOPIC GASTROSTOMY (PEG) PLACEMENT;  Surgeon: Daneil Dolin, MD;  Location: [facility];  Service: Endoscopy;  Laterality: N/A;   Family History  Problem Relation Age of Onset  . Lung cancer Father    History  Substance Use Topics  . Smoking status: Former Smoker -- 2.00 packs/day for 37 years    Types: Cigarettes    Start date: 02/17/1949    Quit date: 03/15/1985  . Smokeless tobacco: Never Used     Comment: quit 25 years ago  . Alcohol Use: No    Review of Systems    Allergies  Atorvastatin and Penicillins  Home  Medications   Prior to Admission medications   Medication Sig Start Date End Date Taking? Authorizing Provider  acetaminophen (TYLENOL) 500 MG tablet Take 1,000 mg by mouth every 6 (six) hours as needed for mild pain or moderate pain.    Historical Provider, MD  Amino Acids-Protein Hydrolys (FEEDING SUPPLEMENT, PRO-STAT SUGAR FREE 64,) LIQD Place 30 mLs into feeding tube 3 (three) times daily. 02/13/14   Erline Hau, MD  aspirin 81 MG chewable tablet Chew 81 mg by mouth daily.  10/11/13 10/11/14  Historical Provider, MD  ceFEPIme 2 g in dextrose 5 % 50 mL Inject 2 g into  the vein at bedtime.  01/10/14 02/26/14  Historical Provider, MD  clopidogrel (PLAVIX) 75 MG tablet Take 75 mg by mouth daily. 01/10/14 01/10/15  Historical Provider, MD  docusate sodium (COLACE) 100 MG capsule Take 100 mg by mouth daily as needed for mild constipation.    Historical Provider, MD  doxazosin (CARDURA) 4 MG tablet Take 4 mg by mouth at bedtime.    Historical Provider, MD  LORazepam (ATIVAN) 0.5 MG tablet Take 1/2 tablet by mouth every 8 hours as needed for anxiety 02/14/14   Tiffany L Reed, DO  megestrol (MEGACE) 400 MG/10ML suspension Take 400 mg by mouth daily.    Historical Provider, MD  metoprolol succinate (TOPROL-XL) 25 MG 24 hr tablet Take 25 mg by mouth daily.    Historical Provider, MD  Nutritional Supplements (FEEDING SUPPLEMENT, JEVITY 1.2 CAL,) LIQD Place 1,000 mLs into feeding tube continuous. 02/13/14   Erline Hau, MD  omeprazole (PRILOSEC) 40 MG capsule Take 40 mg by mouth daily.    Historical Provider, MD  polyethylene glycol (MIRALAX / GLYCOLAX) packet Take 17 g by mouth daily as needed for mild constipation.     Historical Provider, MD  senna (SENOKOT) 8.6 MG TABS tablet Take 2 tablets by mouth at bedtime.    Historical Provider, MD  simvastatin (ZOCOR) 20 MG tablet Take 20 mg by mouth daily.    Historical Provider, MD  traMADol (ULTRAM) 50 MG tablet Take 1 tablet (50 mg total) by mouth every 6 (six) hours as needed for moderate pain. 02/14/14   Tiffany L Reed, DO   BP 94/58 mmHg  Pulse 85  Temp(Src) 100 F (37.8 C) (Rectal)  Resp 29  Ht 6\' 2"  (1.88 m)  Wt 219 lb (99.338 kg)  BMI 28.11 kg/m2  SpO2 100% Physical Exam  Constitutional: He is oriented to person, place, and time. He appears well-developed and well-nourished.  HENT:  Head: Normocephalic and atraumatic.  Eyes: Conjunctivae and EOM are normal. Pupils are equal, round, and reactive to light.  Neck: Normal range of motion. Neck supple.  Cardiovascular: Normal rate, regular rhythm and  normal heart sounds.   Pulmonary/Chest: Effort normal.  Abdominal: Soft. Bowel sounds are normal.  Musculoskeletal: Normal range of motion.  Neurological: He is alert and oriented to person, place, and time.  Skin: Skin is warm and dry.  Psychiatric: He has a normal mood and affect. His behavior is normal.  Nursing note and vitals reviewed.   ED Course  Procedures (including critical care time)  DIAGNOSTIC STUDIES: Oxygen Saturation is 96% on room air, adequate by my interpretation.    COORDINATION OF CARE: 5:42 PM - Treatment plan with pt at bedside includes IV hydration, IV antibiotics, admission   Labs Review Labs Reviewed  LACTIC ACID, PLASMA - Abnormal; Notable for the following:    Lactic Acid, Venous 3.2 (*)  All other components within normal limits  CBC WITH DIFFERENTIAL - Abnormal; Notable for the following:    WBC 3.4 (*)    RBC 3.57 (*)    Hemoglobin 9.5 (*)    HCT 29.5 (*)    RDW 19.9 (*)    Platelets 103 (*)    Neutrophils Relative % 90 (*)    Lymphocytes Relative 8 (*)    Lymphs Abs 0.3 (*)    Monocytes Relative 2 (*)    All other components within normal limits  COMPREHENSIVE METABOLIC PANEL - Abnormal; Notable for the following:    BUN 41 (*)    Creatinine, Ser 1.38 (*)    Albumin 2.5 (*)    GFR calc non Af Amer 46 (*)    GFR calc Af Amer 53 (*)    Anion gap 16 (*)    All other components within normal limits  URINALYSIS, ROUTINE W REFLEX MICROSCOPIC - Abnormal; Notable for the following:    Hgb urine dipstick TRACE (*)    Protein, ur 30 (*)    All other components within normal limits  CULTURE, BLOOD (ROUTINE X 2)  CULTURE, BLOOD (ROUTINE X 2)  URINE MICROSCOPIC-ADD ON  LACTIC ACID, PLASMA  COMPREHENSIVE METABOLIC PANEL  CBC  PROTIME-INR  APTT    Imaging Review Dg Chest Port 1 View  (if Code Sepsis Called)  02/21/2014   CLINICAL DATA:  80 year old male with fever of 103. PICC line removed yesterday. Current history of recurrent  Pseudomonas bacteremia. Suspected sepsis. Initial encounter.  EXAM: PORTABLE CHEST - 1 VIEW  COMPARISON:  02/10/2014 and earlier.  FINDINGS: Portable AP upright view at 1137 hrs. Improved lung volumes. The patient is mildly rotated to the left. A chronic descending thoracic aorta endograft appears grossly stable. Stable cardiac size and mediastinal contours. No pneumothorax or pulmonary edema. Chronic hypo ventilation at the left lung base, Stable from recent comparisons. No definite effusion or consolidation. No acute osseous abnormality identified.  IMPRESSION: No acute cardiopulmonary abnormality. Chronic hypoventilation at the left lung base, stable from recent comparisons.   Electronically Signed   By: Lars Pinks M.D.   On: 02/21/2014 11:52     EKG Interpretation   Date/Time:  Thursday February 21 2014 11:17:23 EST Ventricular Rate:  128 PR Interval:  87 QRS Duration: 100 QT Interval:  370 QTC Calculation: 540 R Axis:   6 Text Interpretation:  Sinus tachycardia Atrial premature complex Abnormal  R-wave progression, early transition Prolonged QT interval Confirmed by  Lacinda Axon  MD, Khalia Gong ([id number]) on 02/21/2014 11:27:49 AM Also confirmed by Lacinda Axon   MD, Taysean Wager ([id number])  on 02/21/2014 2:50:04 PM     CRITICAL CARE Performed by: Nat Christen Total critical care time: 45 Critical care time was exclusive of separately billable procedures and treating other patients. Critical care was necessary to treat or prevent imminent or life-threatening deterioration. Critical care was time spent personally by me on the following activities: development of treatment plan with patient and/or surrogate as well as nursing, discussions with consultants, evaluation of patient's response to treatment, examination of patient, obtaining history from patient or surrogate, ordering and performing treatments and interventions, ordering and review of laboratory studies, ordering and review of radiographic studies, pulse oximetry  and re-evaluation of patient's condition. MDM   Final diagnoses:  Fever, unspecified fever cause   Patient is most likely a septic from recurrent endovascular stent infections. IV fluids,  IV Levaquin started per consultation obtained yesterday. Blood cultures. Discussed with the daughter. Discussed  with Dr. Caryn Section. Additional IV antibiotics vancomycin and Maxipime ordered.  Will transfer to [facility]. Dr. Karleen Hampshire to accept transfer.  I personally performed the services described in this documentation, which was scribed in my presence. The recorded information has been reviewed and is accurate.        Nat Christen, MD 02/21/14 1414  Nat Christen, MD 02/21/14 [phone]

## 2014-02-21 NOTE — ED Notes (Signed)
Patient alert and talking at this time. Patient incontinent of bowel. Patient cleaned and repositioned in bed.

## 2014-02-21 NOTE — Progress Notes (Signed)
80 year old male with with h/o thoracoabdominal aortic aneurysm repair in July of this year at [facility], complicated by a pseudomonas graft infection comes in to [facility] for septic shock from recurrent pseudomonas bacteremia from infected aortic graft. He is being [facility] to [facility] step down as per the family request. On arrival he will need a brief H&P note and please see the detailed note of Dr Caryn Section who saw the patient earlier today. Patient will also need ID consultation in am.  Hosie Poisson, MD [phone]

## 2014-02-21 NOTE — Progress Notes (Signed)
ANTIBIOTIC CONSULT NOTE - INITIAL  Pharmacy Consult for Vancomycin & Cefepime Indication: bacteremia  Allergies  Allergen Reactions  . Atorvastatin Other (See Comments)    Causes constipation  . Penicillins Other (See Comments)    Other reaction(s): RASH HIVES     Patient Measurements: Height: 6\' 2"  (188 cm) Weight: 219 lb (99.338 kg) IBW/kg (Calculated) : 82.2 Adjusted Body Weight:   Vital Signs: Temp: 100.6 F (38.1 C) (12/10 1511) Temp Source: Rectal (12/10 1511) BP: 95/61 mmHg (12/10 1600) Pulse Rate: 91 (12/10 1600) Intake/Output from previous day:   Intake/Output from this shift: Total I/O In: -  Out: 30 [Urine:30]  Labs:  Recent Labs  02/21/14 1127  WBC 3.4*  HGB 9.5*  PLT 103*  CREATININE 1.38*   Estimated Creatinine Clearance: 52 mL/min (by C-G formula based on Cr of 1.38). No results for input(s): VANCOTROUGH, VANCOPEAK, VANCORANDOM, GENTTROUGH, GENTPEAK, GENTRANDOM, TOBRATROUGH, TOBRAPEAK, TOBRARND, AMIKACINPEAK, AMIKACINTROU, AMIKACIN in the last 72 hours.   Microbiology: Recent Results (from the past 720 hour(s))  Urine culture     Status: None   Collection Time: 02/06/14  2:55 PM  Result Value Ref Range Status   Specimen Description URINE, CATHETERIZED  Final   Special Requests NONE  Final   Culture  Setup Time   Final    02/06/2014 22:59 Performed at [place] Performed at [facility]   Final   Culture NO GROWTH Performed at [facility]   Final   Report Status 02/08/2014 FINAL  Final    Medical History: Past Medical History  Diagnosis Date  . Hypertension   . BPH (benign prostatic hyperplasia)   . Hyperlipidemia   . Cancer 2008-2009    prostate TREATED WITH RADIATION  . AAA (abdominal aortic aneurysm) 04/2012    STENTING OF AAA IN [place]  . Junctional cardiac arrhythmia     Occurred postoperatively after urologic surgery  . Severe sinus bradycardia     Occurred  postoperatively after urologic surgery  . Aortic valve disorders   . Other primary cardiomyopathies   . Dysphagia   . Status post insertion of percutaneous endoscopic gastrostomy (PEG) tube 02/11/14  . Bacteremia due to Pseudomonas   . Infected aortic graft     Medications:  Scheduled:  . [START ON 02/22/2014] vancomycin  750 mg Intravenous Q12H   Assessment Septic shock secondary to recurrent Pseudomonas bacteremia from infected aortic graft.The patient is status post thoracoabdominal aortic aneurysm repair in July 2015. This was complicated bya Pseudomonas graft infection. Apparently he had been treated with cefepime via PICC line for several weeks, recently changed to [place].  Goal of Therapy:  Vancomycin trough level 15-20 mcg/ml  Plan:  Vancomycin 1 GM IV, then 750 mg IV every 12 hours Cefepime 2 GM IV every 8 hours Vancomycin trough at steady state Monitor renal function Labs per protocol  Abner Greenspan, Nikka Hakimian Bennett 02/21/2014,4:23 PM

## 2014-02-21 NOTE — ED Notes (Signed)
[facility] spoke w/ [name] on pt admission.

## 2014-02-21 NOTE — ED Notes (Signed)
Daughter at bedside with info regarding patient history. EDP aware.

## 2014-02-22 DIAGNOSIS — N183 Chronic kidney disease, stage 3 unspecified: Secondary | ICD-10-CM | POA: Diagnosis present

## 2014-02-22 DIAGNOSIS — R509 Fever, unspecified: Secondary | ICD-10-CM

## 2014-02-22 DIAGNOSIS — I5043 Acute on chronic combined systolic (congestive) and diastolic (congestive) heart failure: Secondary | ICD-10-CM | POA: Diagnosis present

## 2014-02-22 DIAGNOSIS — Z86718 Personal history of other venous thrombosis and embolism: Secondary | ICD-10-CM

## 2014-02-22 DIAGNOSIS — R7881 Bacteremia: Secondary | ICD-10-CM | POA: Diagnosis present

## 2014-02-22 DIAGNOSIS — T827XXA Infection and inflammatory reaction due to other cardiac and vascular devices, implants and grafts, initial encounter: Principal | ICD-10-CM

## 2014-02-22 LAB — INFLUENZA PANEL BY PCR (TYPE A & B)
H1N1 flu by pcr: NOT DETECTED
Influenza A By PCR: NEGATIVE
Influenza B By PCR: NEGATIVE

## 2014-02-22 LAB — IRON AND TIBC
Iron: 14 ug/dL — ABNORMAL LOW (ref 42–135)
Saturation Ratios: 6 % — ABNORMAL LOW (ref 20–55)
TIBC: 218 ug/dL (ref 215–435)
UIBC: 204 ug/dL (ref 125–400)

## 2014-02-22 LAB — RETICULOCYTES
RBC.: 3.62 MIL/uL — ABNORMAL LOW (ref 4.22–5.81)
RETIC CT PCT: 1.6 % (ref 0.4–3.1)
Retic Count, Absolute: 57.9 10*3/uL (ref 19.0–186.0)

## 2014-02-22 LAB — CBC
HEMATOCRIT: 22.7 % — AB (ref 39.0–52.0)
Hemoglobin: 7.4 g/dL — ABNORMAL LOW (ref 13.0–17.0)
MCH: 27 pg (ref 26.0–34.0)
MCHC: 32.6 g/dL (ref 30.0–36.0)
MCV: 82.8 fL (ref 78.0–100.0)
PLATELETS: 69 10*3/uL — AB (ref 150–400)
RBC: 2.74 MIL/uL — AB (ref 4.22–5.81)
RDW: 20.2 % — AB (ref 11.5–15.5)
WBC: 3.4 10*3/uL — AB (ref 4.0–10.5)

## 2014-02-22 LAB — FERRITIN: Ferritin: 690 ng/mL — ABNORMAL HIGH (ref 22–322)

## 2014-02-22 LAB — PROCALCITONIN: Procalcitonin: 6.82 ng/mL

## 2014-02-22 LAB — VITAMIN B12: VITAMIN B 12: 459 pg/mL (ref 211–911)

## 2014-02-22 LAB — FOLATE: Folate: 16.4 ng/mL

## 2014-02-22 MED ORDER — DEXTROSE 5 % IV SOLN
2.0000 g | Freq: Two times a day (BID) | INTRAVENOUS | Status: DC
  Administered 2014-02-22: 2 g via INTRAVENOUS
  Filled 2014-02-22 (×2): qty 2

## 2014-02-22 MED ORDER — RIFAMPIN 300 MG PO CAPS
300.0000 mg | ORAL_CAPSULE | Freq: Two times a day (BID) | ORAL | Status: DC
  Administered 2014-02-22 – 2014-03-01 (×15): 300 mg via ORAL
  Filled 2014-02-22 (×16): qty 1

## 2014-02-22 MED ORDER — SODIUM CHLORIDE 0.9 % IV SOLN
1.0000 g | Freq: Two times a day (BID) | INTRAVENOUS | Status: DC
  Administered 2014-02-22 – 2014-02-26 (×9): 1 g via INTRAVENOUS
  Filled 2014-02-22 (×11): qty 1

## 2014-02-22 NOTE — Progress Notes (Signed)
[name]  TAREQ DWAN XIP:[id number] DOB: 09-30-1929 DOA: 02/21/2014 PCP: [name], MD  Brief narrative: 80 year old male patient with a very complex past medical history. He has a known left lower extremity DVT dating back to Feb 2015 but had to stop anticoagulation because of gross hematuria and subsequently an IVC filter was placed. In July 2015 (at [facility]) he underwent repair of a thoraco-abdominal aortic aneurysm but unfortunately this procedure was complicated by a Pseudomonas graft infection. He had a PICC line placed and received IV cefepime. He was dc'd to a nursing facility. He was dx'd with dysphagia and poor oral intake with severe DH during a hospitalization in early December of this year. A PEG tube was placed at that time.  In regards to the Pseudomonas graft infection with bacteremia, this was initially diagnosed in October 2015 and was pansensitive. He has been followed by infectious disease service from [facility] since that time. His PICC line was removed on 12/9 and antibiotics were changed to Levaquin per PEG.   He presented to [facility] on 12/10 with a temperature of 103.3. This was associated with hypotension. He was empirically started on vancomycin and cefepime. His chest x-ray was unremarkable. He had a mild leukopenia with a white cell count of 3400. Urinalysis was not consistent with UTI. Plans were to initially to send the patient to [facility] to resume treatment.  In the interim the family decided that they would like to be transferred to [facility] and request consultation with ID physicians there. He was transferred via ambulance to [facility] and upon arrival was hemodynamically stable and was not hypoxic.  HPI/Subjective: Patient alert but confused. Denies abdominal pain. Tells me he does take some things by mouth.  Assessment/Plan:    Septic shock -Previous sepsis physiology appears  to have resolved -Continue empiric antibiotics with changes as outlined by infectious disease physician (see below) -Continue IV hydration -Procalcitonin elevated at 6.82 -Lactic acid elevated at 4.0    History of Bacteremia due to Pseudomonas / Infected aortic graft - 1/2 blood cx + gram neg rods -ID consulted -Recommended changing cefepime to meropenem and adding rifampin -Recommended check CT chest abdomen pelvis to evaluate his graft noting this would need to be done with IV contrast; patient has chronic kidney disease so need to be cautious with contrast administration  Gram + cocci bacteremia -await speciation - covered w/ Vanc for now     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 -Compensated -Use caution with administration of IV fluids -BP still soft so Cardura and Toprol remain on hold    Fever -Etiology presumed due to recurrent bacteremia and Pseudomonas -Follow up on blood cultures and continue empiric therapies -Rule out influenza   Anemia -Presenting hemoglobin 9.5 -After hydration hemoglobin 7.4 -Baseline hemoglobin around 9.2 -Follow -Check stools for blood    HTN -BP remains soft; medications on hold as above    Thrombocytopenia -Has chronic thrombocytopenia but current readings are lower than normal -Likely due to ongoing issues with gram-negative infection    Stage III chronic kidney disease -Baseline renal function BUN 26 creatinine 1.41 -Presented with BUN elevated at 41 with stable creatinine; likely due to volume depletion     History of DVT of lower extremity (Feb 2015) -post IVC filter -Follow-no lower extremity edema    Protein-calorie malnutrition, severe -In November patient had issues  with low magnesium and low phosphorus so we'll need to repeat these labs here and replete as needed    Dysphagia/Status post insertion of PEG tube -Was started on tube feedings during last admission; continue  Jevity -Unclear if patient takes anything by mouth for comfort; nothing by mouth for now   DVT prophylaxis: SCDs + prior IVC filter Code Status: Full Family Communication: No family at bedside Disposition Plan/Expected LOS: Stepdown  Consultants: Infectious disease/Dr. Johnnye Sima  Procedures: None  Antibiotics: Cefepime 12/10 > 12/11 Vancomycin 12/10 > Levaquin 12/10 Meropenem 12/11 >  Objective: Blood pressure 119/53, pulse 66, temperature 98.4 F (36.9 C), temperature source Oral, resp. rate 15, height 6\' 2"  (1.88 m), weight 222 lb 7.1 oz (100.9 kg), SpO2 95 %.  Intake/Output Summary (Last 24 hours) at 02/22/14 1304 Last data filed at 02/22/14 1219  Gross per 24 hour  Intake   2020 ml  Output   1530 ml  Net    490 ml   Exam: Gen: No acute respiratory distress-pleasant but confused Chest: Clear bilaterally upon posterior auscultation without wheezes, rhonchi or crackles, room air Cardiac: Regular rate, S1-S2, no rubs murmurs or gallops, no peripheral edema, no JVD Abdomen: Soft nontender nondistended without obvious hepatosplenomegaly, no ascites; PEG tube site unremarkable Extremities: Symmetrical in appearance without cyanosis, clubbing or effusion  Scheduled Meds:  Scheduled Meds: . meropenem (MERREM) IV  1 g Intravenous Q12H  . pantoprazole sodium  40 mg Per Tube Daily  . polyethylene glycol  17 g Per Tube Daily  . rifampin  300 mg Oral Q12H  . vancomycin  750 mg Intravenous Q12H   Continuous Infusions: . sodium chloride 150 mL/hr at 02/21/14 2200  . feeding supplement (JEVITY 1.2 CAL)      Data Reviewed: Basic Metabolic Panel:  Recent Labs Lab 02/21/14 1127  NA 139  K 4.3  CL 100  CO2 23  GLUCOSE 91  BUN 41*  CREATININE 1.38*  CALCIUM 9.5   Liver Function Tests:  Recent Labs Lab 02/21/14 1127  AST 26  ALT 13  ALKPHOS 69  BILITOT 0.6  PROT 7.0  ALBUMIN 2.5*   No results for input(s): LIPASE, AMYLASE in the last 168 hours. No results  for input(s): AMMONIA in the last 168 hours. CBC:  Recent Labs Lab 02/21/14 1127 02/22/14 1000  WBC 3.4* 3.4*  NEUTROABS 3.0  --   HGB 9.5* 7.4*  HCT 29.5* 22.7*  MCV 82.6 82.8  PLT 103* 69*    Recent Results (from the past 240 hour(s))  Blood culture (routine x 2)     Status: None (Preliminary result)   Collection Time: 02/21/14 12:04 PM  Result Value Ref Range Status   Specimen Description BLOOD LEFT ANTECUBITAL  Final   Special Requests   Final    BOTTLES DRAWN AEROBIC AND ANAEROBIC 5CC  IMMUNE:COMPROMISED   Culture  Setup Time   Final    GRAM POSITIVE COCCI Gram Stain Report Called to,Read Back By and Verified With: EILLIS,A ON 02/22/2014 AT 1245    Culture PENDING  Incomplete   Report Status PENDING  Incomplete  MRSA PCR Screening     Status: None   Collection Time: 02/21/14  9:58 PM  Result Value Ref Range Status   MRSA by PCR NEGATIVE NEGATIVE Final    Comment:        The GeneXpert MRSA Assay (FDA approved for NASAL specimens only), is one component of a comprehensive MRSA colonization surveillance program. It is not intended  to diagnose MRSA infection nor to guide or monitor treatment for MRSA infections.      Studies:  Recent x-ray studies have been reviewed in detail by the Attending Physician  Time spent :  [place], ANP [facility] Office  [phone]  On-Call/Text Page:      [email or web address]      password [email or web address]  If 7PM-7AM, please contact night-coverage [email or web address] Password [email or web address] 02/22/2014, 1:04 PM   LOS: 1 day   I have personally examined this patient and reviewed the entire database. I have reviewed the above note, made any necessary editorial changes, and agree with its content.  Cherene Altes, MD [facility]

## 2014-02-22 NOTE — Progress Notes (Signed)
Utilization Review Completed.  

## 2014-02-22 NOTE — Progress Notes (Signed)
Protonix per GT given but unable to scan the med.

## 2014-02-22 NOTE — Consult Note (Signed)
[name] for Infectious Disease  Date of Admission:  02/21/2014  Date of Consult:  02/22/2014  Reason for Consult: fever, vascular graft infection Referring Physician: Thereasa Solo  Impression/Recommendation Fever Vascular graft infection  Would Watch his repeat BCx, UCx Change cefepime to meropenem Add rifampin Would check CT chest/abd/pelvis to eval his graft. This would need to be with contrast.   Comment- Would favor changing his antibiotics to carbapenem, Pseudomonas have the capacity become resistant to anbx fairly easily. He has been on anbx for a prolonged period and this is certainly at risk. There is been some historical support for the use of rifampin in Pseudomonas graft infections, this literature is very limited. I seriously doubt he would be a candidate for graft resection, I will defer this to his primary physicians at [facility]. Agree with efforts to return this pt to his PCP/[facility]   Thank you so much for this interesting consult,   [name] (pager) [phone] [email or web address]  [name] is an 80 y.o. male.  HPI: 80 year old male with a history of a thoraco-abdominal aortic aneurysm in July 7048 complicated by Pseudomonas (sensitive to ceftaz, Zosyn, gent, Levaquin) graft infection and subsequent pseudomonas (12-16-13, pan-sens). He was started on cefepime at that time and was discharged to skilled nursing facility with plan for ongoing IV antibiotics. He has been followed by [facility] infectious disease. His PICC line was removed 12-9 and his antibiotics were changed to by mouth Levaquin by [facility].  He is sent to [facility] on 12-10 with a temp of 103.3 and hypotension In the emergency room he was started on vancomycin and cefepime. His CXR is no acute, his WBC is slightly low at 3.4. His renal function is roughly at baseline.   Past Medical History  Diagnosis Date  . Hypertension   . BPH (benign prostatic hyperplasia)   . Hyperlipidemia     . Cancer 2008-2009    prostate TREATED WITH RADIATION  . AAA (abdominal aortic aneurysm) 04/2012    STENTING OF AAA IN [place]  . Junctional cardiac arrhythmia     Occurred postoperatively after urologic surgery  . Severe sinus bradycardia     Occurred postoperatively after urologic surgery  . Aortic valve disorders   . Other primary cardiomyopathies   . Dysphagia   . Status post insertion of percutaneous endoscopic gastrostomy (PEG) tube 02/11/14  . Bacteremia due to Pseudomonas   . Infected aortic graft     Past Surgical History  Procedure Laterality Date  . Abdominal surgery      PART OF COLON REMOVED FOR DIVERTICULITIS  . Appendectomy    . Hernia repair    . Bladder surgery  2008    FOR BLADDER STONE  . Total knee arthroplasty  1990    left  . Abdominal aortic aneurysm repair  04/2012  . Total knee arthroplasty Right 07/26/2012    Procedure: RIGHT TOTAL KNEE ARTHROPLASTY;  Surgeon: Elijah Bastos, MD;  Location: [facility];  Service: Orthopedics;  Laterality: Right;  . Nephrolithotomy Left 04/26/2013    Procedure: LEFT PERCUTANEOUS NEPHROLITHOTOMY ;  Surgeon: Elijah Seal, MD;  Location: [facility];  Service: Urology;  Laterality: Left;  . Nephrolithotomy Left 05/03/2013    Procedure: 2ND STAGE LEFT PERCUTANEOUS NEPHROLITHOTOMY ;  Surgeon: Elijah Seal, MD;  Location: [facility];  Service: Urology;  Laterality: Left;  . Esophagogastroduodenoscopy N/A 02/11/2014    Procedure: ESOPHAGOGASTRODUODENOSCOPY (EGD);  Surgeon: Elijah Dolin, MD;  Location: [facility];  Service: Endoscopy;  Laterality: N/A;  With PEG placement  . Peg placement N/A 02/11/2014    Procedure: PERCUTANEOUS ENDOSCOPIC GASTROSTOMY (PEG) PLACEMENT;  Surgeon: Elijah Dolin, MD;  Location: [facility];  Service: Endoscopy;  Laterality: N/A;     Allergies  Allergen Reactions  . Atorvastatin Other (See Comments)    Causes constipation  . Penicillins Other (See Comments)    Other reaction(s): RASH HIVES      Medications:  Scheduled: . ceFEPime (MAXIPIME) IV  2 g Intravenous Q12H  . pantoprazole sodium  40 mg Per Tube Daily  . polyethylene glycol  17 g Per Tube Daily  . vancomycin  750 mg Intravenous Q12H    Abtx:  Anti-infectives    Start     Dose/Rate Route Frequency Ordered Stop   02/22/14 1000  ceFEPIme (MAXIPIME) 2 g in dextrose 5 % 50 mL IVPB     2 g100 mL/hr over 30 Minutes Intravenous Every 12 hours 02/22/14 0833     02/22/14 0500  vancomycin (VANCOCIN) IVPB 750 mg/150 ml premix     750 mg150 mL/hr over 60 Minutes Intravenous Every 12 hours 02/21/14 1622     02/21/14 1800  ceFEPIme (MAXIPIME) 2 g in dextrose 5 % 50 mL IVPB  Status:  Discontinued     2 g100 mL/hr over 30 Minutes Intravenous Every 8 hours 02/21/14 1622 02/22/14 0833   02/21/14 1700  vancomycin (VANCOCIN) IVPB 1000 mg/200 mL premix  Status:  Discontinued     1,000 mg200 mL/hr over 60 Minutes Intravenous  Once 02/21/14 1622 02/21/14 1707   02/21/14 1315  levofloxacin (LEVAQUIN) IVPB 750 mg     750 mg100 mL/hr over 90 Minutes Intravenous  Once 02/21/14 1310 02/21/14 1525      Total days of antibiotics: 2 (in hospital)          Social History:  reports that he quit smoking about 28 years ago. His smoking use included Cigarettes. He started smoking about 65 years ago. He has a 74 pack-year smoking history. He has never used smokeless tobacco. He reports that he does not drink alcohol or use illicit drugs.  Family History  Problem Relation Age of Onset  . Lung cancer Father     General ROS: He denies diarrhea. He denies dysuria. He denies abdominal pain. See history of present illness.  Blood pressure 116/55, pulse 66, temperature 98.3 F (36.8 C), temperature source Oral, resp. rate 15, height _0  (1.88 m), weight 100.9 kg (222 lb 7.1 oz), SpO2 95 %. General appearance: alert, cooperative, no distress and pale Eyes: negative findings: conjunctivae and sclerae normal and pupils equal, round, reactive to  light and accomodation Throat: normal findings: oropharynx pink & moist without lesions or evidence of thrush and dry Neck: no adenopathy and supple, symmetrical, trachea midline Lungs: clear to auscultation bilaterally Heart: regular rate and rhythm and systolic murmur: early systolic 3/6, crescendo at 2nd left intercostal space, at 2nd right intercostal space Abdomen: normal findings: bowel sounds normal, no masses palpable and soft, non-tender Extremities: edema None and Vital extremity previous PICC line site is clean. No tenderness. No cordis. He has a 3+ pulse right dorsalis pedis, 2+ pulse left posterior tibial.   Results for orders placed or performed during the hospital encounter of 02/21/14 (from the past 48 hour(s))  Lactic acid, plasma     Status: Abnormal   Collection Time: 02/21/14 11:27 AM  Result Value Ref Range   Lactic Acid, Venous 3.2 (H)  0.5 - 2.2 mmol/L  CBC with Differential     Status: Abnormal   Collection Time: 02/21/14 11:27 AM  Result Value Ref Range   WBC 3.4 (L) 4.0 - 10.5 K/uL   RBC 3.57 (L) 4.22 - 5.81 MIL/uL   Hemoglobin 9.5 (L) 13.0 - 17.0 g/dL   HCT 29.5 (L) 39.0 - 52.0 %   MCV 82.6 78.0 - 100.0 fL   MCH 26.6 26.0 - 34.0 pg   MCHC 32.2 30.0 - 36.0 g/dL   RDW 19.9 (H) 11.5 - 15.5 %   Platelets 103 (L) 150 - 400 K/uL    Comment: SPECIMEN CHECKED FOR CLOTS PLATELET COUNT CONFIRMED BY SMEAR    Neutrophils Relative % 90 (H) 43 - 77 %   Neutro Abs 3.0 1.7 - 7.7 K/uL   Lymphocytes Relative 8 (L) 12 - 46 %   Lymphs Abs 0.3 (L) 0.7 - 4.0 K/uL   Monocytes Relative 2 (L) 3 - 12 %   Monocytes Absolute 0.1 0.1 - 1.0 K/uL   Eosinophils Relative 1 0 - 5 %   Eosinophils Absolute 0.0 0.0 - 0.7 K/uL   Basophils Relative 0 0 - 1 %   Basophils Absolute 0.0 0.0 - 0.1 K/uL  Comprehensive metabolic panel     Status: Abnormal   Collection Time: 02/21/14 11:27 AM  Result Value Ref Range   Sodium 139 137 - 147 mEq/L   Potassium 4.3 3.7 - 5.3 mEq/L   Chloride 100 96  - 112 mEq/L   CO2 23 19 - 32 mEq/L   Glucose, Bld 91 70 - 99 mg/dL   BUN 41 (H) 6 - 23 mg/dL   Creatinine, Ser 1.38 (H) 0.50 - 1.35 mg/dL   Calcium 9.5 8.4 - 10.5 mg/dL   Total Protein 7.0 6.0 - 8.3 g/dL   Albumin 2.5 (L) 3.5 - 5.2 g/dL   AST 26 0 - 37 U/L   ALT 13 0 - 53 U/L   Alkaline Phosphatase 69 39 - 117 U/L   Total Bilirubin 0.6 0.3 - 1.2 mg/dL   GFR calc non Af Amer 46 (L) >90 mL/min   GFR calc Af Amer 53 (L) >90 mL/min    Comment: (NOTE) The eGFR has been calculated using the CKD EPI equation. This calculation has not been validated in all clinical situations. eGFR's persistently <90 mL/min signify possible Chronic Kidney Disease.    Anion gap 16 (H) 5 - 15  Urinalysis, Routine w reflex microscopic     Status: Abnormal   Collection Time: 02/21/14  1:18 PM  Result Value Ref Range   Color, Urine YELLOW YELLOW   APPearance CLEAR CLEAR   Specific Gravity, Urine 1.015 1.005 - 1.030   pH 7.0 5.0 - 8.0   Glucose, UA NEGATIVE NEGATIVE mg/dL   Hgb urine dipstick TRACE (A) NEGATIVE   Bilirubin Urine NEGATIVE NEGATIVE   Ketones, ur NEGATIVE NEGATIVE mg/dL   Protein, ur 30 (A) NEGATIVE mg/dL   Urobilinogen, UA 0.2 0.0 - 1.0 mg/dL   Nitrite NEGATIVE NEGATIVE   Leukocytes, UA NEGATIVE NEGATIVE  Urine microscopic-add on     Status: None   Collection Time: 02/21/14  1:18 PM  Result Value Ref Range   Squamous Epithelial / LPF RARE RARE   WBC, UA 3-6 <3 WBC/hpf   RBC / HPF 0-2 <3 RBC/hpf   Bacteria, UA RARE RARE  Lactic acid, plasma     Status: Abnormal   Collection Time: 02/21/14  4:55 PM  Result Value Ref Range   Lactic Acid, Venous 4.0 (H) 0.5 - 2.2 mmol/L  MRSA PCR Screening     Status: None   Collection Time: 02/21/14  9:58 PM  Result Value Ref Range   MRSA by PCR NEGATIVE NEGATIVE    Comment:        The GeneXpert MRSA Assay (FDA approved for NASAL specimens only), is one component of a comprehensive MRSA colonization surveillance program. It is not intended to  diagnose MRSA infection nor to guide or monitor treatment for MRSA infections.       Component Value Date/Time   SDES URINE, CATHETERIZED 02/06/2014 1455   SPECREQUEST NONE 02/06/2014 1455   CULT NO GROWTH Performed at [facility]  02/06/2014 1455   REPTSTATUS 02/08/2014 FINAL 02/06/2014 1455   Dg Chest Port 1 View  (if Code Sepsis Called)  02/21/2014   CLINICAL DATA:  80 year old male with fever of 103. PICC line removed yesterday. Current history of recurrent Pseudomonas bacteremia. Suspected sepsis. Initial encounter.  EXAM: PORTABLE CHEST - 1 VIEW  COMPARISON:  02/10/2014 and earlier.  FINDINGS: Portable AP upright view at 1137 hrs. Improved lung volumes. The patient is mildly rotated to the left. A chronic descending thoracic aorta endograft appears grossly stable. Stable cardiac size and mediastinal contours. No pneumothorax or pulmonary edema. Chronic hypo ventilation at the left lung base, Stable from recent comparisons. No definite effusion or consolidation. No acute osseous abnormality identified.  IMPRESSION: No acute cardiopulmonary abnormality. Chronic hypoventilation at the left lung base, stable from recent comparisons.   Electronically Signed   By: Lars Pinks M.D.   On: 02/21/2014 11:52   Recent Results (from the past 240 hour(s))  MRSA PCR Screening     Status: None   Collection Time: 02/21/14  9:58 PM  Result Value Ref Range Status   MRSA by PCR NEGATIVE NEGATIVE Final    Comment:        The GeneXpert MRSA Assay (FDA approved for NASAL specimens only), is one component of a comprehensive MRSA colonization surveillance program. It is not intended to diagnose MRSA infection nor to guide or monitor treatment for MRSA infections.       02/22/2014, 10:25 AM     LOS: 1 day

## 2014-02-22 NOTE — Progress Notes (Signed)
CRITICAL VALUE ALERT  Critical value received:  Gm (+) cocci in anaerobic bottle; gm (-) rods in the aerobic bottle.  Date of notification:  02/22/2014   Time of notification:  1400  Critical value read back:yes  Nurse who received alert:  Donnella Bi  MD notified (1st page):  Ms. Erin Hearing, NP  Time of first page:  1413  MD notified (2nd page):  Time of second page:  Responding MD:  Ms. Erin Hearing, NP  Time MD responded:  [phone]

## 2014-02-22 NOTE — Progress Notes (Signed)
[place] for Vancomycin & Cefepime to Meropenem Indication: bacteremia  Allergies  Allergen Reactions  . Atorvastatin Other (See Comments)    Causes constipation  . Penicillins Other (See Comments)    Other reaction(s): RASH HIVES     Labs:  Recent Labs  02/21/14 1127  WBC 3.4*  HGB 9.5*  PLT 103*  CREATININE 1.38*   Estimated Creatinine Clearance: 52.4 mL/min (by C-G formula based on Cr of 1.38). No results for input(s): VANCOTROUGH, VANCOPEAK, VANCORANDOM, GENTTROUGH, GENTPEAK, GENTRANDOM, TOBRATROUGH, TOBRAPEAK, TOBRARND, AMIKACINPEAK, AMIKACINTROU, AMIKACIN in the last 72 hours.   Microbiology: Recent Results (from the past 720 hour(s))  Urine culture     Status: None   Collection Time: 02/06/14  2:55 PM  Result Value Ref Range Status   Specimen Description URINE, CATHETERIZED  Final   Special Requests NONE  Final   Culture  Setup Time   Final    02/06/2014 22:59 Performed at [place] Performed at [facility]   Final   Culture NO GROWTH Performed at [facility]   Final   Report Status 02/08/2014 FINAL  Final  MRSA PCR Screening     Status: None   Collection Time: 02/21/14  9:58 PM  Result Value Ref Range Status   MRSA by PCR NEGATIVE NEGATIVE Final    Comment:        The GeneXpert MRSA Assay (FDA approved for NASAL specimens only), is one component of a comprehensive MRSA colonization surveillance program. It is not intended to diagnose MRSA infection nor to guide or monitor treatment for MRSA infections.      Assessment Septic shock secondary to recurrent Pseudomonas bacteremia from infected aortic graft.The patient is status post thoracoabdominal aortic aneurysm repair in July 2015. This was complicated bya Pseudomonas graft infection.  Apparently he had been treated with cefepime via PICC line for several weeks, recently changed to [place].  Antibiotic  changed from Cefepime to meropenem Rifampin added  Goal of Therapy:  Vancomycin trough level 15-20 mcg/ml  Plan:  Continue Vancomycin  750 mg IV every 12 hours Meropenem 1 gram iv Q 12 hours  Thank you. Anette Guarneri, PharmD  02/22/2014,11:25 AM

## 2014-02-22 NOTE — Progress Notes (Signed)
Telephone conversation with one of patient's daughters [name]: Telephone number [phone]. Had discussion regarding need for family to meet with palliative care to discuss CODE STATUS and goals of care. Historically this patient has vacillated back and forth in the past between DO NOT RESUSCITATE and full code. Typically once his status improves he wants to be a full code basically stating that if his heart stopped he would want restarted but does not wish to be on life support. I did explain to [name] that in an effort to restart his heart that would actually require life support and given his multiple comorbidities and advanced age that he would likely not survive resuscitation. She understands this but felt she needed to speak with the other siblings before decision was made. As of family they have opted to meet with palliative care to best explore and discussed their options. A formal palliative care consultation has been requested.  Milon Dikes, ANP

## 2014-02-23 DIAGNOSIS — Z515 Encounter for palliative care: Secondary | ICD-10-CM

## 2014-02-23 DIAGNOSIS — D696 Thrombocytopenia, unspecified: Secondary | ICD-10-CM

## 2014-02-23 DIAGNOSIS — I10 Essential (primary) hypertension: Secondary | ICD-10-CM

## 2014-02-23 DIAGNOSIS — E46 Unspecified protein-calorie malnutrition: Secondary | ICD-10-CM

## 2014-02-23 DIAGNOSIS — R627 Adult failure to thrive: Secondary | ICD-10-CM

## 2014-02-23 LAB — PHOSPHORUS: Phosphorus: 2.4 mg/dL (ref 2.3–4.6)

## 2014-02-23 LAB — COMPREHENSIVE METABOLIC PANEL
ALK PHOS: 52 U/L (ref 39–117)
ALT: 13 U/L (ref 0–53)
AST: 24 U/L (ref 0–37)
Albumin: 1.7 g/dL — ABNORMAL LOW (ref 3.5–5.2)
Anion gap: 12 (ref 5–15)
BUN: 31 mg/dL — ABNORMAL HIGH (ref 6–23)
CHLORIDE: 107 meq/L (ref 96–112)
CO2: 19 mEq/L (ref 19–32)
Calcium: 8.6 mg/dL (ref 8.4–10.5)
Creatinine, Ser: 1.1 mg/dL (ref 0.50–1.35)
GFR calc Af Amer: 70 mL/min — ABNORMAL LOW (ref >90)
GFR calc non Af Amer: 61 mL/min — ABNORMAL LOW (ref >90)
Glucose, Bld: 107 mg/dL — ABNORMAL HIGH (ref 70–99)
POTASSIUM: 3.9 meq/L (ref 3.7–5.3)
Sodium: 138 mEq/L (ref 137–147)
Total Bilirubin: 0.4 mg/dL (ref 0.3–1.2)
Total Protein: 5.4 g/dL — ABNORMAL LOW (ref 6.0–8.3)

## 2014-02-23 LAB — CBC
HEMATOCRIT: 22 % — AB (ref 39.0–52.0)
HEMOGLOBIN: 7.3 g/dL — AB (ref 13.0–17.0)
MCH: 27.3 pg (ref 26.0–34.0)
MCHC: 33.2 g/dL (ref 30.0–36.0)
MCV: 82.4 fL (ref 78.0–100.0)
Platelets: 67 10*3/uL — ABNORMAL LOW (ref 150–400)
RBC: 2.67 MIL/uL — ABNORMAL LOW (ref 4.22–5.81)
RDW: 20.1 % — ABNORMAL HIGH (ref 11.5–15.5)
WBC: 2.7 10*3/uL — AB (ref 4.0–10.5)

## 2014-02-23 LAB — MAGNESIUM: Magnesium: 1.8 mg/dL (ref 1.5–2.5)

## 2014-02-23 LAB — URINE CULTURE
COLONY COUNT: NO GROWTH
CULTURE: NO GROWTH
Special Requests: NORMAL

## 2014-02-23 MED ORDER — DOXAZOSIN MESYLATE 4 MG PO TABS
4.0000 mg | ORAL_TABLET | Freq: Every day | ORAL | Status: DC
Start: 2014-02-23 — End: 2014-03-02
  Administered 2014-02-23 – 2014-02-28 (×6): 4 mg
  Filled 2014-02-23 (×8): qty 1

## 2014-02-23 MED ORDER — ASPIRIN 81 MG PO CHEW
81.0000 mg | CHEWABLE_TABLET | Freq: Every day | ORAL | Status: DC
  Administered 2014-02-23 – 2014-02-24 (×2): 81 mg
  Filled 2014-02-23 (×3): qty 1

## 2014-02-23 MED ORDER — METOPROLOL SUCCINATE ER 25 MG PO TB24
25.0000 mg | ORAL_TABLET | Freq: Every day | ORAL | Status: DC
  Administered 2014-02-24: 25 mg via ORAL
  Filled 2014-02-23 (×2): qty 1

## 2014-02-23 MED ORDER — MIRTAZAPINE 30 MG PO TABS
30.0000 mg | ORAL_TABLET | Freq: Every day | ORAL | Status: DC
  Administered 2014-02-23 – 2014-02-28 (×6): 30 mg
  Filled 2014-02-23 (×7): qty 1

## 2014-02-23 MED ORDER — METHYLPHENIDATE HCL 5 MG PO TABS
5.0000 mg | ORAL_TABLET | Freq: Every day | ORAL | Status: DC
  Administered 2014-02-24 – 2014-03-01 (×6): 5 mg
  Filled 2014-02-23 (×6): qty 1

## 2014-02-23 MED ORDER — CLOPIDOGREL BISULFATE 75 MG PO TABS
75.0000 mg | ORAL_TABLET | Freq: Every day | ORAL | Status: DC
  Administered 2014-02-23 – 2014-02-24 (×2): 75 mg
  Filled 2014-02-23 (×2): qty 1

## 2014-02-23 NOTE — Progress Notes (Signed)
Patient ID: [name], male   DOB: 30-Oct-1929, 80 y.o.   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 meropenem (MERREM) IV  1 g Intravenous Q12H  . pantoprazole sodium  40 mg Per Tube Daily  . polyethylene glycol  17 g Per Tube Daily  . rifampin  300 mg Oral Q12H  . vancomycin  750 mg Intravenous Q12H    Subjective: He states he is feeling better.  Review of Systems: Review of systems not obtained due to patient factors.  Past Medical History  Diagnosis Date  . Hypertension   . BPH (benign prostatic hyperplasia)   . Hyperlipidemia   . Cancer 2008-2009    prostate TREATED WITH RADIATION  . AAA (abdominal aortic aneurysm) 04/2012    STENTING OF AAA IN [place]  . Junctional cardiac arrhythmia     Occurred postoperatively after urologic surgery  . Severe sinus bradycardia     Occurred postoperatively after urologic surgery  . Aortic valve disorders   . Other primary cardiomyopathies   . Dysphagia   . Status post insertion of percutaneous endoscopic gastrostomy (PEG) tube 02/11/14  . Bacteremia due to Pseudomonas   . Infected aortic graft     History  Substance Use Topics  . Smoking status: Former Smoker -- 2.00 packs/day for 37 years    Types: Cigarettes    Start  date: 02/17/1949    Quit date: 03/15/1985  . Smokeless tobacco: Never Used     Comment: quit 25 years ago  . Alcohol Use: No    Family History  Problem Relation Age of Onset  . Lung cancer Father    Allergies  Allergen Reactions  . Atorvastatin Other (See Comments)    Causes constipation  . Penicillins Other (See Comments)    Other reaction(s): RASH HIVES     OBJECTIVE: Blood pressure 126/76, pulse 72, temperature 97.8 F (36.6 C), temperature source Oral, resp. rate 22, height 6\' 2"  (1.88 m), weight 231 lb 9.6 oz (105.053 kg), SpO2 99 %. General: He was asleep but arouses easily. Appears to be slightly confused. Skin: Previous right arm PICC site appears normal Lungs: Clear Cor: Regular S1 and S2 with a 2/6 early systolic murmur heard best at the right upper sternal border Abdomen: Soft and nontender  Lab Results Lab Results  Component Value Date   WBC 2.7* 02/23/2014   HGB 7.3* 02/23/2014   HCT 22.0* 02/23/2014   MCV 82.4 02/23/2014   PLT 67* 02/23/2014    Lab Results  Component Value Date   CREATININE 1.10 02/23/2014   BUN 31* 02/23/2014   NA 138 02/23/2014  K 3.9 02/23/2014   CL 107 02/23/2014   CO2 19 02/23/2014    Lab Results  Component Value Date   ALT 13 02/23/2014   AST 24 02/23/2014   ALKPHOS 52 02/23/2014   BILITOT 0.4 02/23/2014     Microbiology: Recent Results (from the past 240 hour(s))  Blood culture (routine x 2)     Status: None (Preliminary result)   Collection Time: 02/21/14 12:04 PM  Result Value Ref Range Status   Specimen Description BLOOD LEFT ANTECUBITAL  Final   Special Requests   Final    BOTTLES DRAWN AEROBIC AND ANAEROBIC 5CC  IMMUNE:COMPROMISED   Culture  Setup Time   Final    GRAM POSITIVE COCCI IN ANAEROBIC BOTTLE Gram Stain Report Called to,Read Back By and Verified With: EILLIS,A ON 02/22/2014 AT 1245 GRAM NEGATIVE RODS IN AEROBIC BOTTLE  Gram Stain Report Called to,Read Back By and Verified With: Gram Stain Report  Called to,Read Back By and Verified With: CARDONE,T AT 1400 ON 02/22/2014 BY ISLEY,B    Culture PENDING  Incomplete   Report Status PENDING  Incomplete  MRSA PCR Screening     Status: None   Collection Time: 02/21/14  9:58 PM  Result Value Ref Range Status   MRSA by PCR NEGATIVE NEGATIVE Final    Comment:        The GeneXpert MRSA Assay (FDA approved for NASAL specimens only), is one component of a comprehensive MRSA colonization surveillance program. It is not intended to diagnose MRSA infection nor to guide or monitor treatment for MRSA infections.     Assessment: One of 2 admission blood cultures is growing gram-positive cocci and gram-negative rods. I suspect that he may have a PICC related bloodstream infection superimposed upon his Pseudomonas aortic graft infection. I will continue current antibiotics pending final blood culture results. He appears to be defervescing.  Plan: 1. Continue current antibiotics pending final blood culture results  Michel Bickers, MD [facility] for [place] [phone] pager   [phone] cell 02/23/2014, 12:57 PM

## 2014-02-23 NOTE — Progress Notes (Signed)
Patient found in bed with blood tinge fluids around tip of penis and bed pad. Patient had pulled and foley and appeared to traumatized self. Interventions included irrigation of foley and replacement of size 49F catheter but patient managed to urinate around catheter. Discontinued indwelling and placed condom catheter to give urethra a rest from the trauma. Will report off to am Nurse. Will continue to monitor patient status.Leeann Must

## 2014-02-23 NOTE — Progress Notes (Addendum)
Patient ID: [name], male   DOB: Sep 22, 1929, 80 y.o.   MRN: [id number]               PROGRESS NOTE  DATE:  02/20/2014    FACILITY: [facility]    LEVEL OF CARE:   SNF   Acute Visit   CHIEF COMPLAINT:  Follow up weight loss, failure to thrive, ?depression.    HISTORY OF PRESENT ILLNESS:  This is a man who came to [place] after a series of trips to [facility], largely as a result of an infected endograft in his abdominal aorta.  He was on protracted cefepime for this, recently changed to Levaquin.  I believe he has a follow-up today in [place] with regards to this issue.  He still has his PICC line in, which I am looking to be able to address if they are not planning on giving him further IV antibiotics (although I note that he was previously on Levaquin for this issue).    He was recently in hospital for dehydration and failure to thrive.  He was rehydrated.  He had a PEG tube placed and is largely getting full PEG tube feeding currently.      The cause of this issue, i.e. the anorexia, has never really been clear to me.  The issues I have witnessed are that he puts food in his mouth and immediately rejects it, almost spitting it out halfway across the room.  This does not have the time to be a swallowing issue and he passed a swallowing study.  I tried to see if he could have an upper GI series, but he could not tolerate this.  I finally had the thought about this being depression and he is on Remeron.    A final issue is that the patient is not participating in physical therapy.  I have discussed this today at length with the physical therapist, who reminds me that he was here in May 2014 for a right total knee replacement and he was apparently not well motivated at that time.    PHYSICAL EXAMINATION:   GENERAL APPEARANCE:   The patient does not look to be in any distress.   CARDIOVASCULAR:  CARDIAC:   Heart sounds are normal.  He does not appear to be dehydrated.     GASTROINTESTINAL:  ABDOMEN:   Soft.   LIVER/SPLEEN/KIDNEYS:  No liver, no spleen.  No tenderness.   PSYCHIATRIC:   MENTAL STATUS:   He appears brighter today.  I am not really certain if that is because he is going to [facility] and, hence, would be a transient issue.     Impression/plan: Weight loss/severe protien calorie malnutrition. Now with a gastrostomy tube. The cause of this is not completely clear. If there is a primary medical issue I don't see this. I think Major depression is playing a significant/total role  ASSESSMENT/PLAN:   I am going to increase the Remeron to 30 mg.  I am going to start him on Ritalin 5 mg q.a.m.  I am hopeful that today's interview will be a more protracted course towards improvement.

## 2014-02-23 NOTE — Consult Note (Signed)
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   1.  Code Status: Full  2. GOC: Spoke with daughter [name] via phone today. She is currently in [place] but flying back to night.  She is able to elaborate on issues related to aneurysm repair extensively. He has had multiple setbacks and freely admits that it is difficult for him to get any better. He had been at rehab for the past month but not able to participate much and she estimates that he has not walked in almost 2 months.  She is tearful as things had not gone as hoped and he was fairly functional prior to surgery in July.  Her impression is that he could not tolerate any surgery to repair infected graft and has not done well with any attempts to get him off abx.  She also is aware that he has been so weak that he has not been able to participate in rehab. Feeding tube was placed few weeks ago in hopes of nutrition increasing his strength. Has not happened thus far.  She is hopeful of getting him home for christmas but really wonders where this is all going with her dad's health.  She asks if 1) would he be dependent on IV abx for remainder of his life, if they were to continue to pursue aggressive medical care? 2) Would IV abx eventually fail anyway? 3) Are there oral abx that would be reasonable to use so he could potentially go home?  They have contemplated hospice/palliative care in past.  I think issue with him going home would be around above abx questions and also goals surrounding his tube feeds.  Will meet with family at [place] on Monday and try to navigate through these complex  issues.    3. Symptom Management:   Weight Loss/FTT- PEG placed 11/30.  Has gained some weight since then, but functional status has not seemed to improve. It appears that he also aspirates per old GI notes.  Has not been able to participate significantly in rehab for many weeks. Not sure what else we can add here.  Will evaluate further in goals of care discussion.    4. Psychosocial/Spiritual: From [place].  Has 3 daughters and 1 son.  Has been hospitalized or in nursing facility for past several months.    Brief HPI: 80 yo male with PMHx of HTN, thoraco-abdominal aortic aneurysm s/p graft repair (09/2013), dysphagia/aspiration s/p PEG, FTT, recurrent pseudomonas graft infection who presented on 12/10 from nursing facility with hypotension.  He was subsequently found to have bacteremia here.   His graft repair has been complicated by recurrent pseudomonal infection requiring several course of IV abx.  He has been following with ID at [facility] who recently switched him to oral levofloxacin. He had PICC removed on 12/9.  In addition to above, he has had continued weakness at nursing home and daughter feels he has not walked or seriously participated in rehab for appx the past 8 weeks.  He had ongoing weight loss and dysphagia for which PEG was placed on 11/30.  Since PEG placement he has gained weight  but not improved functional status.  In speaking with Mr Kaczmarczyk today, he has difficult time conversing as his hearing aids are not working. He is only able to tell me very basic information about having recurrent infection. I am unable to elicit any acute complaints.        PMH:  Past Medical History  Diagnosis Date  . Hypertension   . BPH (benign prostatic hyperplasia)   . Hyperlipidemia   . Cancer 2008-2009    prostate TREATED WITH RADIATION  . AAA (abdominal aortic aneurysm) 04/2012    STENTING OF AAA IN [place]  . Junctional cardiac arrhythmia     Occurred postoperatively after urologic  surgery  . Severe sinus bradycardia     Occurred postoperatively after urologic surgery  . Aortic valve disorders   . Other primary cardiomyopathies   . Dysphagia   . Status post insertion of percutaneous endoscopic gastrostomy (PEG) tube 02/11/14  . Bacteremia due to Pseudomonas   . Infected aortic graft      PSH: Past Surgical History  Procedure Laterality Date  . Abdominal surgery      PART OF COLON REMOVED FOR DIVERTICULITIS  . Appendectomy    . Hernia repair    . Bladder surgery  2008    FOR BLADDER STONE  . Total knee arthroplasty  1990    left  . Abdominal aortic aneurysm repair  04/2012  . Total knee arthroplasty Right 07/26/2012    Procedure: RIGHT TOTAL KNEE ARTHROPLASTY;  Surgeon: Tobi Bastos, MD;  Location: [facility];  Service: Orthopedics;  Laterality: Right;  . Nephrolithotomy Left 04/26/2013    Procedure: LEFT PERCUTANEOUS NEPHROLITHOTOMY ;  Surgeon: [name], MD;  Location: [facility];  Service: Urology;  Laterality: Left;  . Nephrolithotomy Left 05/03/2013    Procedure: 2ND STAGE LEFT PERCUTANEOUS NEPHROLITHOTOMY ;  Surgeon: [name], MD;  Location: [facility];  Service: Urology;  Laterality: Left;  . Esophagogastroduodenoscopy N/A 02/11/2014    Procedure: ESOPHAGOGASTRODUODENOSCOPY (EGD);  Surgeon: Daneil Dolin, MD;  Location: [facility];  Service: Endoscopy;  Laterality: N/A;  With PEG placement  . Peg placement N/A 02/11/2014    Procedure: PERCUTANEOUS ENDOSCOPIC GASTROSTOMY (PEG) PLACEMENT;  Surgeon: Daneil Dolin, MD;  Location: [facility];  Service: Endoscopy;  Laterality: N/A;   I have reviewed the [place] and SH and  If appropriate update it with new information. Allergies  Allergen Reactions  . Atorvastatin Other (See Comments)    Causes constipation  . Penicillins Other (See Comments)    Other reaction(s): RASH HIVES    Scheduled Meds: . meropenem (MERREM) IV  1 g Intravenous Q12H  . pantoprazole sodium  40 mg Per Tube Daily  . polyethylene  glycol  17 g Per Tube Daily  . rifampin  300 mg Oral Q12H  . vancomycin  750 mg Intravenous Q12H   Continuous Infusions: . sodium chloride 100 mL/hr at 02/23/14 0606  . feeding supplement (JEVITY 1.2 CAL) 1,000 mL (02/23/14 1036)   PRN Meds:.acetaminophen (TYLENOL) oral liquid 160 mg/5 mL, albuterol, LORazepam, morphine injection, ondansetron **OR** ondansetron (ZOFRAN) IV    BP 149/74 mmHg  Pulse 78  Temp(Src) 98.5 F (36.9 C) (Oral)  Resp 24  Ht 6\' 2"  (1.88 m)  Wt 105.053 kg (231 lb 9.6 oz)  BMI 29.72 kg/m2  SpO2 95%   PPS: 30   Intake/Output Summary (Last 24 hours) at 02/23/14 1216 Last data filed at 02/23/14 0435  Gross per 24 hour  Intake      0 ml  Output   1575 ml  Net  -1575 ml    Physical Exam:  General: Alert, hard of hearing HEENT: [place], sclera anicteric, mmm  Labs: CBC    Component Value Date/Time   WBC 2.7* 02/23/2014 0049   WBC 5.8 12/26/2013 1105   WBC 5.4 02/28/2007 0919   RBC 2.67* 02/23/2014 0049   RBC 3.62* 02/21/2014 1127   RBC 4.10* 12/26/2013 1105   RBC 5.21 02/28/2007 0919   HGB 7.3* 02/23/2014 0049   HGB 11.0* 12/26/2013 1105   HGB 15.0 02/28/2007 0919   HCT 22.0* 02/23/2014 0049   HCT 34.5* 12/26/2013 1105   HCT 44.0 02/28/2007 0919   PLT 67* 02/23/2014 0049   PLT 157 12/26/2013 1105   PLT 153 02/28/2007 0919   MCV 82.4 02/23/2014 0049   MCV 84 12/26/2013 1105   MCV 84.4 02/28/2007 0919   MCH 27.3 02/23/2014 0049   MCH 26.8* 12/26/2013 1105   MCH 28.7 02/28/2007 0919   MCHC 33.2 02/23/2014 0049   MCHC 31.9* 12/26/2013 1105   MCHC 34.0 02/28/2007 0919   RDW 20.1* 02/23/2014 0049   RDW 17.5* 12/26/2013 1105   RDW 14.9* 02/28/2007 0919   LYMPHSABS 0.3* 02/21/2014 1127   LYMPHSABS 0.9 12/26/2013 1105   LYMPHSABS 1.6 02/28/2007 0919   MONOABS 0.1 02/21/2014 1127   MONOABS 0.6 02/28/2007 0919   EOSABS 0.0 02/21/2014 1127   EOSABS 0.1 12/26/2013 1105   EOSABS 0.3 02/28/2007 0919   BASOSABS 0.0 02/21/2014 1127   BASOSABS  0.0 12/26/2013 1105   BASOSABS 0.0 02/28/2007 0919    BMET    Component Value Date/Time   NA 138 02/23/2014 0049   NA 140 10/31/2013 0839   K 3.9 02/23/2014 0049   K 3.9 10/31/2013 0839   CL 107 02/23/2014 0049   CL 103 10/31/2013 0839   CO2 19 02/23/2014 0049   CO2 27 10/31/2013 0839   GLUCOSE 107* 02/23/2014 0049   GLUCOSE 93 10/31/2013 0839   BUN 31* 02/23/2014 0049   BUN 17 10/31/2013 0839   CREATININE 1.10 02/23/2014 0049   CREATININE 1.1 10/31/2013 0839   CALCIUM 8.6 02/23/2014 0049   CALCIUM 8.9 10/31/2013 0839   GFRNONAA 61* 02/23/2014 0049   GFRAA 70* 02/23/2014 0049    CMP     Component Value Date/Time   NA 138 02/23/2014 0049   NA 140 10/31/2013 0839   K 3.9 02/23/2014 0049   K 3.9 10/31/2013 0839   CL 107 02/23/2014 0049   CL 103 10/31/2013 0839   CO2 19 02/23/2014 0049   CO2 27 10/31/2013 0839   GLUCOSE 107* 02/23/2014 0049   GLUCOSE 93 10/31/2013 0839   BUN 31* 02/23/2014 0049   BUN 17 10/31/2013 0839   CREATININE 1.10 02/23/2014 0049   CREATININE 1.1 10/31/2013 0839   CALCIUM 8.6 02/23/2014 0049   CALCIUM 8.9 10/31/2013 0839   PROT 5.4* 02/23/2014 0049   ALBUMIN 1.7* 02/23/2014 0049   AST 24 02/23/2014 0049   ALT 13 02/23/2014 0049   ALKPHOS 52 02/23/2014 0049   BILITOT 0.4 02/23/2014 0049   GFRNONAA 61* 02/23/2014 0049   GFRAA 70* 02/23/2014 0049   12/10 CXR IMPRESSION: No acute cardiopulmonary abnormality. Chronic hypoventilation at the left lung base, stable from recent comparisons.  Total Time: 50 minutes  Greater than 50%  of this time was spent counseling and coordinating care related to the above assessment and plan.  Marjory Lies  Rockey Situ D.O. Palliative Medicine Team at [facility]  Pager: [phone] Team Phone: [phone]

## 2014-02-23 NOTE — Progress Notes (Signed)
[name]  [name] EPP:[id number] DOB: 1929/11/02 DOA: 02/21/2014 PCP: [name], MD  Brief narrative: 80 year old male patient with a very complex past medical history. He has a known left lower extremity DVT dating back to Feb 2015 but had to stop anticoagulation because of gross hematuria and subsequently an IVC filter was placed. In July 2015 (at [facility]) he underwent repair of a thoraco-abdominal aortic aneurysm but unfortunately this procedure was complicated by a Pseudomonas graft infection. He had a PICC line placed and received IV cefepime. He was dc'd to a nursing facility. He was dx'd with dysphagia and poor oral intake with severe DH during a hospitalization in early December of this year. A PEG tube was placed at that time.  In regards to the Pseudomonas graft infection with bacteremia, this was initially diagnosed in October 2015 and was pansensitive. He has been followed by infectious disease service from [facility] since that time. His PICC line was removed on 12/9 and antibiotics were changed to Levaquin per PEG.   He presented to [facility] on 12/10 with a temperature of 103.3. This was associated with hypotension. He was empirically started on vancomycin and cefepime. His chest x-ray was unremarkable. He had a mild leukopenia with a white cell count of 3400. Urinalysis was not consistent with UTI. Plans were to initially to send the patient to [facility] to resume treatment.  In the interim the family decided that they would like to be transferred to [facility] and request consultation with ID physicians there. He was transferred via ambulance to [facility] and upon arrival was hemodynamically stable and was not hypoxic.  HPI/Subjective: Pt c/o pain in his L arm at his IV site.  He denies sob, chest pain, or abdom pain.  He pulled his own foley out last night, and then was leaking around a replacement so  he currently has no foley and simply a condom cath.  He is passing urine freely.    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 grade 1 diastolic congestive heart failure Compensated   Anemia Presenting hemoglobin 9.5 - after hydration hemoglobin nadir 7.3 - transfuse as needed to keep Hgb 7.0 or >  HTN BP improved - slowly phase in usual home meds   Thrombocytopenia chronic thrombocytopenia but current readings are lower than normal likely due to ongoing issues with gram-negative infection  Stage III chronic kidney disease Baseline renal function BUN 26 creatinine 1.41- creatinine now better than baseline s/p volume resuscitation  History of DVT of lower extremity (Feb 2015) - post IVC filter no lower extremity edema  Protein-calorie malnutrition, severe PEG tube feeds to continue - lytes stable   Dysphagia/Status post insertion of PEG tube started on tube feedings during last admission; continue Jevity  DVT prophylaxis: SCDs + prior IVC filter Code Status: Full Family Communication: No family at bedside at time of exam today  Disposition Plan/Expected LOS: Stepdown  Consultants: Infectious Disease  Procedures: None  Antibiotics: Cefepime 12/10 > 12/11 Vancomycin 12/10 >  Levaquin 12/10 Meropenem 12/11 > Rifampin 12/11 >  Objective: Blood pressure 126/76, pulse 72, temperature 97.8 F (36.6 C), temperature source Oral, resp. rate 22, height 6\' 2"  (1.88 m), weight 105.053 kg (231 lb 9.6 oz), SpO2 99 %.  Intake/Output Summary (Last 24 hours) at 02/23/14 1539 Last  data filed at 02/23/14 1216  Gross per 24 hour  Intake 2731.67 ml  Output   1125 ml  Net 1606.67 ml   Exam: Gen: No acute respiratory distress - remains confused Chest: CTA B w/o wheeze or focal crackles  Cardiac: regular rate - no appreciable gallup or rub - 3/6 systolic M Abdomen: Soft nontender nondistended without obvious hepatosplenomegaly, no ascites; PEG tube site unremarkable Extremities: no signif C/C/E b LE   Scheduled Meds:  Scheduled Meds: . meropenem (MERREM) IV  1 g Intravenous Q12H  . pantoprazole sodium  40 mg Per Tube Daily  . polyethylene glycol  17 g Per Tube Daily  . rifampin  300 mg Oral Q12H  . vancomycin  750 mg Intravenous Q12H   Continuous Infusions: . sodium chloride 100 mL/hr at 02/23/14 0606  . feeding supplement (JEVITY 1.2 CAL) 1,000 mL (02/23/14 1036)    Data Reviewed: Basic Metabolic Panel:  Recent Labs Lab 02/21/14 1127 02/23/14 0049  NA 139 138  K 4.3 3.9  CL 100 107  CO2 23 19  GLUCOSE 91 107*  BUN 41* 31*  CREATININE 1.38* 1.10  CALCIUM 9.5 8.6  MG  --  1.8  PHOS  --  2.4   Liver Function Tests:  Recent Labs Lab 02/21/14 1127 02/23/14 0049  AST 26 24  ALT 13 13  ALKPHOS 69 52  BILITOT 0.6 0.4  PROT 7.0 5.4*  ALBUMIN 2.5* 1.7*   CBC:  Recent Labs Lab 02/21/14 1127 02/22/14 1000 02/23/14 0049  WBC 3.4* 3.4* 2.7*  NEUTROABS 3.0  --   --   HGB 9.5* 7.4* 7.3*  HCT 29.5* 22.7* 22.0*  MCV 82.6 82.8 82.4  PLT 103* 69* 67*    Recent Results (from the past 240 hour(s))  Blood culture (routine x 2)     Status: None (Preliminary result)   Collection Time: 02/21/14 12:04 PM  Result Value Ref Range Status   Specimen Description BLOOD LEFT ANTECUBITAL  Final   Special Requests   Final    BOTTLES DRAWN AEROBIC AND ANAEROBIC 5CC  IMMUNE:COMPROMISED   Culture  Setup Time   Final    02/23/2014 00:32 Performed at [facility]    Culture   Final    GRAM NEGATIVE RODS Note: Gram Stain Report Called to,Read Back  By and Verified With: CARDONE T. AT 1400 ON 02/22/14 BY ISLEY B. Performed at [facility] Performed at [facility]    Report Status PENDING  Incomplete  MRSA PCR Screening     Status: None   Collection Time: 02/21/14  9:58 PM  Result Value Ref Range Status   MRSA by PCR NEGATIVE NEGATIVE Final    Comment:        The GeneXpert MRSA Assay (FDA approved for NASAL specimens only), is one component of a comprehensive MRSA colonization surveillance program. It is not intended to diagnose MRSA infection nor to guide or monitor treatment for MRSA infections.      Studies:  Recent x-ray studies have been reviewed in detail by the Attending Physician  Time spent :  35 mins   Cherene Altes, MD [facility] For Consults/Admissions - Flow  Manager - [phone] Office  [phone] Pager [phone]  On-Call/Text Page:      [email or web address]      password [facility]  02/23/2014, 3:39 PM   LOS: 2 days

## 2014-02-24 ENCOUNTER — Encounter: Payer: Self-pay | Admitting: Internal Medicine

## 2014-02-24 DIAGNOSIS — R5081 Fever presenting with conditions classified elsewhere: Secondary | ICD-10-CM | POA: Insufficient documentation

## 2014-02-24 DIAGNOSIS — A419 Sepsis, unspecified organism: Secondary | ICD-10-CM | POA: Insufficient documentation

## 2014-02-24 LAB — COMPREHENSIVE METABOLIC PANEL
ALBUMIN: 1.8 g/dL — AB (ref 3.5–5.2)
ALK PHOS: 66 U/L (ref 39–117)
ALT: 12 U/L (ref 0–53)
ANION GAP: 11 (ref 5–15)
AST: 24 U/L (ref 0–37)
BUN: 23 mg/dL (ref 6–23)
CALCIUM: 8.7 mg/dL (ref 8.4–10.5)
CO2: 23 mEq/L (ref 19–32)
Chloride: 107 mEq/L (ref 96–112)
Creatinine, Ser: 0.86 mg/dL (ref 0.50–1.35)
GFR calc Af Amer: >90 mL/min (ref >90)
GFR calc non Af Amer: 79 mL/min — ABNORMAL LOW (ref >90)
Glucose, Bld: 109 mg/dL — ABNORMAL HIGH (ref 70–99)
POTASSIUM: 3.8 meq/L (ref 3.7–5.3)
SODIUM: 141 meq/L (ref 137–147)
Total Bilirubin: 0.6 mg/dL (ref 0.3–1.2)
Total Protein: 5.5 g/dL — ABNORMAL LOW (ref 6.0–8.3)

## 2014-02-24 LAB — CBC
HCT: 23 % — ABNORMAL LOW (ref 39.0–52.0)
Hemoglobin: 7.5 g/dL — ABNORMAL LOW (ref 13.0–17.0)
MCH: 26.4 pg (ref 26.0–34.0)
MCHC: 32.6 g/dL (ref 30.0–36.0)
MCV: 81 fL (ref 78.0–100.0)
PLATELETS: 75 10*3/uL — AB (ref 150–400)
RBC: 2.84 MIL/uL — AB (ref 4.22–5.81)
RDW: 20.1 % — ABNORMAL HIGH (ref 11.5–15.5)
WBC: 3.2 10*3/uL — ABNORMAL LOW (ref 4.0–10.5)

## 2014-02-24 MED ORDER — METOPROLOL TARTRATE 25 MG PO TABS
25.0000 mg | ORAL_TABLET | Freq: Two times a day (BID) | ORAL | Status: DC
  Administered 2014-02-24 – 2014-03-01 (×11): 25 mg
  Filled 2014-02-24 (×12): qty 1

## 2014-02-24 MED ORDER — FREE WATER
100.0000 mL | Freq: Three times a day (TID) | Status: DC
  Administered 2014-02-24 – 2014-03-01 (×15): 100 mL

## 2014-02-24 NOTE — Progress Notes (Addendum)
Patient ID: [name], male   DOB: 07-01-1929, 80 y.o.   MRN: [id number]         [place] for Infectious Disease    Date of Admission:  02/21/2014   Prolonged antibiotics         Day 4 vancomycin        Day 3 meropenem        Day 3 rifampin  Principal Problem:   Recurrent bacteremia Active Problems:   HTN (hypertension)   Thrombocytopenia   History of DVT of lower extremity (Feb 2015) -post IVC filter   Protein-calorie malnutrition, severe   Septic shock   History of Bacteremia due to Pseudomonas   Infected aortic graft   Stage III chronic kidney disease   Dysphagia   Status post insertion of percutaneous endoscopic gastrostomy (PEG) tube   Acute on chronic combined systolic (EF 26%) and grade 1 diastolic congestive heart failure   CKD (chronic kidney disease), stage III   Palliative care encounter   . aspirin  81 mg Per Tube Daily  . clopidogrel  75 mg Per Tube Daily  . doxazosin  4 mg Per Tube QHS  . meropenem (MERREM) IV  1 g Intravenous Q12H  . methylphenidate  5 mg Per Tube Daily  . metoprolol succinate  25 mg Oral Daily  . mirtazapine  30 mg Per Tube QHS  . pantoprazole sodium  40 mg Per Tube Daily  . polyethylene glycol  17 g Per Tube Daily  . rifampin  300 mg Oral Q12H  . vancomycin  750 mg Intravenous Q12H    Subjective: He is feeling better.  Review of Systems: Review of systems not obtained due to patient factors.  Past Medical History  Diagnosis Date  . Hypertension   . BPH (benign prostatic hyperplasia)   . Hyperlipidemia   . Cancer 2008-2009    prostate TREATED WITH RADIATION  . AAA (abdominal aortic aneurysm) 04/2012    STENTING OF AAA IN [place]  . Junctional cardiac arrhythmia     Occurred postoperatively after urologic surgery  . Severe sinus bradycardia     Occurred postoperatively after urologic surgery  . Aortic valve disorders   . Other primary cardiomyopathies   . Dysphagia   . Status post insertion of  percutaneous endoscopic gastrostomy (PEG) tube 02/11/14  . Bacteremia due to Pseudomonas   . Infected aortic graft     History  Substance Use Topics  . Smoking status: Former Smoker -- 2.00 packs/day for 37 years    Types: Cigarettes    Start date: 02/17/1949    Quit date: 03/15/1985  . Smokeless tobacco: Never Used     Comment: quit 25 years ago  . Alcohol Use: No    Family History  Problem Relation Age of Onset  . Lung cancer Father    Allergies  Allergen Reactions  . Atorvastatin Other (See Comments)    Causes constipation  . Penicillins Other (See Comments)    Other reaction(s): RASH HIVES     OBJECTIVE: Blood pressure 148/85, pulse 78, temperature 98.5 F (36.9 C), temperature source Oral, resp. rate 20, height 6\' 2"  (1.88 m), weight 230 lb 8 oz (104.554 kg), SpO2 95 %.   General: He was asleep but arouses easily.  Lungs: Clear Cor: Regular S1 and S2 with a 2/6 early systolic murmur  Abdomen: Soft and nontender  Lab Results Lab Results  Component Value Date   WBC 3.2* 02/24/2014   HGB 7.5*  02/24/2014   HCT 23.0* 02/24/2014   MCV 81.0 02/24/2014   PLT 75* 02/24/2014    Lab Results  Component Value Date   CREATININE 0.86 02/24/2014   BUN 23 02/24/2014   NA 141 02/24/2014   K 3.8 02/24/2014   CL 107 02/24/2014   CO2 23 02/24/2014    Lab Results  Component Value Date   ALT 12 02/24/2014   AST 24 02/24/2014   ALKPHOS 66 02/24/2014   BILITOT 0.6 02/24/2014     Microbiology: Recent Results (from the past 240 hour(s))  Blood culture (routine x 2)     Status: None (Preliminary result)   Collection Time: 02/21/14 12:04 PM  Result Value Ref Range Status   Specimen Description BLOOD LEFT ANTECUBITAL  Final   Special Requests   Final    BOTTLES DRAWN AEROBIC AND ANAEROBIC 5CC  IMMUNE:COMPROMISED   Culture  Setup Time   Final    02/23/2014 00:32 Performed at [facility]    Culture   Final    PROTEUS SPECIES Note: Gram Stain Report Called  to,Read Back By and Verified With: CARDONE T. AT 1400 ON 02/22/14 BY ISLEY B. Performed at [facility] Performed at [facility]    Report Status PENDING  Incomplete  Blood culture (routine x 2)     Status: None (Preliminary result)   Collection Time: 02/21/14 12:18 PM  Result Value Ref Range Status   Specimen Description BLOOD LEFT HAND  Final   Special Requests   Final    BOTTLES DRAWN AEROBIC AND ANAEROBIC 5CC  IMMUNE:COMPROMISED   Culture NO GROWTH 3 DAYS  Final   Report Status PENDING  Incomplete  MRSA PCR Screening     Status: None   Collection Time: 02/21/14  9:58 PM  Result Value Ref Range Status   MRSA by PCR NEGATIVE NEGATIVE Final    Comment:        The GeneXpert MRSA Assay (FDA approved for NASAL specimens only), is one component of a comprehensive MRSA colonization surveillance program. It is not intended to diagnose MRSA infection nor to guide or monitor treatment for MRSA infections.   Urine culture     Status: None   Collection Time: 02/22/14 10:29 AM  Result Value Ref Range Status   Specimen Description URINE, CATHETERIZED  Final   Special Requests Normal  Final   Culture  Setup Time   Final    02/22/2014 17:17 Performed at [place] Performed at [facility]   Final   Culture NO GROWTH Performed at [facility]   Final   Report Status 02/23/2014 FINAL  Final    Assessment: [facility] labs is now saying that the one positive blood culture is growing only Proteus. I asked them to reset of the culture to make sure that there is not another organism there given that yesterday they were reporting gram-positive cocci and gram-negative rods. He is improving.  Plan: 1. Continue current antibiotics pending final blood culture results  Michel Bickers, MD [facility] for Infectious [place] [facility] [phone] pager   [phone] cell 02/24/2014, 1:27 PM   Addendum: The  initial Gram stain of his positive blood culture was reviewed and apparently does not show any gram-positive cocci. I will stop vancomycin now.  Michel Bickers, MD [facility] for [place] [facility] [phone] pager   [phone] cell 02/24/2014, 3:51 PM

## 2014-02-24 NOTE — Progress Notes (Signed)
Patient trasfered from [facility] to 5W20 via bed; alert and oriented x 4; no complaints of pain; IV saline locked in RFA; Orient patient to room and unit; instructed how to use the call bell and  fall risk precautions. Will continue to monitor the patient.

## 2014-02-24 NOTE — Progress Notes (Signed)
[name]  [name] OHY:[id number] DOB: [birth year] DOA: 02/21/2014 PCP: [name], MD  Brief narrative: 80 year old male patient with a very complex past medical history. He has a known left lower extremity DVT dating back to Feb 2015 but had to stop anticoagulation because of gross hematuria and subsequently an IVC filter was placed. 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 He had a PICC line placed and received IV cefepime. He was dc'd to a nursing facility. He was dx'd with dysphagia and poor oral intake with severe DH during a hospitalization in early December of this year. A PEG tube was placed at that time.  In regards to the Pseudomonas graft infection with bacteremia, this was initially diagnosed in October 2015 and was pansensitive. He has been followed by infectious disease service from [facility] since that time. His PICC line was removed on 12/9 and antibiotics were changed to Levaquin per PEG.   He presented to [facility] on 12/10 with a temperature of 103.3. This was associated with hypotension. He was empirically started on vancomycin and cefepime. His chest x-ray was unremarkable. He had a mild leukopenia with a white cell count of 3400. Urinalysis was not consistent with UTI. Plans were to initially to send the patient to [facility] to resume treatment.  In the interim the family decided that they would like to be transferred to [facility] and request consultation with ID physicians there. He was transferred via ambulance to [facility] and upon arrival was hemodynamically stable and was not hypoxic.  HPI/Subjective: Pt is much more alert and interactive today.  He denies new complaints.  He is anxious to begin attempts to ambulate, and to be d/c as soon as possible.     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 grade 1 diastolic congestive heart failure Compensated   Anemia Presenting hemoglobin 9.5 - after hydration hemoglobin nadir 7.3 - transfuse as needed to keep Hgb 7.0 or > - holding steady for now   HTN BP improved - resuming home meds   Thrombocytopenia chronic thrombocytopenia but presenting levels were lower than his baseline likely due to gram-negative infection - plt count is now improving   Stage III chronic kidney disease Baseline renal function BUN 26 creatinine 1.41 - creatinine now better than baseline s/p volume resuscitation - follow - GFR now normal   History of DVT of lower extremity (Feb 2015) - post IVC filter no lower extremity edema  Protein-calorie malnutrition, severe PEG tube feeds to continue - lytes stable   Dysphagia/Status post insertion of PEG tube started on tube feedings during last admission; continue Jevity - able to tolerate oral intake for pleasure   DVT prophylaxis: SCDs + prior IVC filter Code Status: Full Family Communication: spoke w/ daughter at bedside  Disposition Plan/Expected  LOS: stable for transfer to med bed - cont current IV abx - begin PT/OT - ID to advise on length of abx course and agent to be used   Consultants: Infectious Disease  Procedures: None  Antibiotics: Cefepime 12/10 > 12/11 Vancomycin 12/10 > Levaquin 12/10 Meropenem 12/11 > Rifampin 12/11 >  Objective: Blood pressure 148/85,  pulse 78, temperature 98.5 F (36.9 C), temperature source Oral, resp. rate 20, height 6\' 2"  (1.88 m), weight 104.554 kg (230 lb 8 oz), SpO2 95 %.  Intake/Output Summary (Last 24 hours) at 02/24/14 1358 Last data filed at 02/24/14 1316  Gross per 24 hour  Intake 2510.83 ml  Output   2500 ml  Net  10.83 ml   Exam: Gen: No acute respiratory distress - alert and conversant - more oriented today  Chest: CTA B w/o wheeze or focal crackles  Cardiac: regular rate - no appreciable gallup or rub - 3/6 systolic M Abdomen: Soft nontender nondistended without obvious hepatosplenomegaly, no ascites; PEG tube site unremarkable Extremities: no signif C/C/E b LE   Scheduled Meds:  Scheduled Meds: . aspirin  81 mg Per Tube Daily  . clopidogrel  75 mg Per Tube Daily  . doxazosin  4 mg Per Tube QHS  . meropenem (MERREM) IV  1 g Intravenous Q12H  . methylphenidate  5 mg Per Tube Daily  . metoprolol succinate  25 mg Oral Daily  . mirtazapine  30 mg Per Tube QHS  . pantoprazole sodium  40 mg Per Tube Daily  . polyethylene glycol  17 g Per Tube Daily  . rifampin  300 mg Oral Q12H  . vancomycin  750 mg Intravenous Q12H   Continuous Infusions: . sodium chloride 50 mL/hr at 02/23/14 1830  . feeding supplement (JEVITY 1.2 CAL) 1,000 mL (02/24/14 0400)    Data Reviewed: Basic Metabolic Panel:  Recent Labs Lab 02/21/14 1127 02/23/14 0049 02/24/14 0300  NA 139 138 141  K 4.3 3.9 3.8  CL 100 107 107  CO2 23 19 23   GLUCOSE 91 107* 109*  BUN 41* 31* 23  CREATININE 1.38* 1.10 0.86  CALCIUM 9.5 8.6 8.7  MG  --  1.8  --   PHOS  --  2.4  --    Liver Function Tests:  Recent Labs Lab 02/21/14 1127 02/23/14 0049 02/24/14 0300  AST 26 24 24   ALT 13 13 12   ALKPHOS 69 52 66  BILITOT 0.6 0.4 0.6  PROT 7.0 5.4* 5.5*  ALBUMIN 2.5* 1.7* 1.8*   CBC:  Recent Labs Lab 02/21/14 1127 02/22/14 1000 02/23/14 0049 02/24/14 0300  WBC 3.4* 3.4* 2.7* 3.2*  NEUTROABS 3.0  --   --   --   HGB 9.5*  7.4* 7.3* 7.5*  HCT 29.5* 22.7* 22.0* 23.0*  MCV 82.6 82.8 82.4 81.0  PLT 103* 69* 67* 75*    Recent Results (from the past 240 hour(s))  Blood culture (routine x 2)     Status: None (Preliminary result)   Collection Time: 02/21/14 12:04 PM  Result Value Ref Range Status   Specimen Description BLOOD LEFT ANTECUBITAL  Final   Special Requests   Final    BOTTLES DRAWN AEROBIC AND ANAEROBIC 5CC  IMMUNE:COMPROMISED   Culture  Setup Time   Final    02/23/2014 00:32 Performed at [facility]    Culture   Final    PROTEUS SPECIES Note: Gram Stain Report Called to,Read Back By and Verified With: CARDONE T. AT 1400 ON  02/22/14 BY ISLEY B. Performed at [facility] Performed at [facility]    Report Status PENDING  Incomplete  Blood culture (routine x 2)     Status: None (Preliminary result)   Collection Time: 02/21/14 12:18 PM  Result Value Ref Range Status   Specimen Description BLOOD LEFT HAND  Final   Special Requests   Final    BOTTLES DRAWN AEROBIC AND ANAEROBIC 5CC  IMMUNE:COMPROMISED   Culture NO GROWTH 3 DAYS  Final   Report Status PENDING  Incomplete  MRSA PCR Screening     Status: None   Collection Time: 02/21/14  9:58 PM  Result Value Ref Range Status   MRSA by PCR NEGATIVE NEGATIVE Final    Comment:        The GeneXpert MRSA Assay (FDA approved for NASAL specimens only), is one component of a comprehensive MRSA colonization surveillance program. It is not intended to diagnose MRSA infection nor to guide or monitor treatment for MRSA infections.   Urine culture     Status: None   Collection Time: 02/22/14 10:29 AM  Result Value Ref Range Status   Specimen Description URINE, CATHETERIZED  Final   Special Requests Normal  Final   Culture  Setup Time   Final    02/22/2014 17:17 Performed at [place] Performed at [facility]   Final   Culture NO GROWTH Performed at [facility]    Final   Report Status 02/23/2014 FINAL  Final     Studies:  Recent x-ray studies have been reviewed in detail by the Attending Physician  Time spent :  55 mins   Cherene Altes, MD [facility] For Consults/Admissions - Flow Manager - [phone] Office  [phone] Pager [phone]  On-Call/Text Page:      [email or web address]      password [facility]  02/24/2014, 1:58 PM   LOS: 3 days

## 2014-02-24 NOTE — Progress Notes (Signed)
02/24/2014 Patient transfer to 5W20 from [facility], before transfer patient to the 5 floor I notice temp placed in computer was 103 rectally and it was recorded by Granville Lewis (PA) at 1418. RN rechecked temp at 1751 and it was 99.9 rectally. Rn did text [facility] to make him aware, also did report to nurse receiving patient on [facility]. Rico Sheehan RN.

## 2014-02-24 NOTE — Progress Notes (Signed)
Patient ID: [name], male   DOB: 1930-03-05, 80 y.o.   MRN: [id number] Patient ID: [name], male   DOB: Dec 23, 1929, 80 y.o.   MRN: [id number]               PROGRESS NOTE  DATE: 02/21/2014  FACILITY: [facility]    LEVEL OF CARE:   SNF   Acute Visit   CHIEF COMPLAINT: Acute visit secondary to fever of unknown origin-tachycardia-question sepsis    HISTORY OF PRESENT ILLNESS:  This is a man who came to [place] after a series of trips to [facility], largely as a result of an infected endograft in his abdominal aorta.  He was on protracted cefepime for this, recently changed to [place] He had follow-up yesterday in [facility] and his PICC line was discontinued.    He was recently in hospital for dehydration and failure to thrive.  He was rehydrated.  He had a PEG tube placed and is largely getting full PEG tube feeding currently.  This morning patient developed apparently a fairly sudden all set of fever and tachycardia-fever was 103 rectally-with a pulse rate of 130 blood pressure appears stable at 115/67 O2 saturation in the 90s on room air.  Of note he does have a history of atrial fibrillation-on a beta blocker  Patient does not of complaining of acute shortness of breath chest pain-he says he just feels very chilly.  He is having some shaking trembling episodes which would be concerning for sepsis.  Family medical social history as been reviewed per readmission note on 02/18/2014.  Medications have been reviewed per MAR.  Review of systems  .  In general is not complaining of pain but does have a fever with chills.   skin-appears to have some increased warmth I do not note any rashes.  Head ears eyes nose mouth and throat-does not complaining of a sore throat or visual changes.  Respiratory is not complaining of shortness of breath or cough.  Cardiac denies chest pain.  GI-does not complaining of any abdominal discomfort or nausea or  vomiting.  Diarrhea or constipation  GU-does not complain of dysuria specifically.  Musculoskeletal has significant weakness but does not complaining of joint pain currently.  Neurologic is not complaining of headache or dizziness or syncopal-type feelings.  Psych there is some element suspected of depression but apparently been doing a bit better more bright and alert.  Physical exam.  Temperature 103 rectally pulse 130 respirations 18 blood pressure 115/61 O2 saturation in the low 90s on room air.  In general this is a frail elderly male lying in bed in no acute distress but he does appear to be uncomfortable shaking with chills.  Skin-has increased warmth to touch I do not note any rashes.  PICC line has been removed dressing in place upper left arm.  Eyes pupils appear reactive to light sclera and conjunctiva clear.  Visual acuity appears grossly intact.  Oropharynx is clear mucous membranes appear somewhat dry.  Chest is clear to auscultation with poor respiratory effort there is no labored breathing.  Heart is tachycardic with a rate of 130 per serial exams he does not really have significant lower extremity edema.  Abdomen is soft nontender takes site appears unremarkable.  Muscle skeletal moves extremities at baseline with some lower extremity weakness I do not note any deformities.  Neurologic appears grossly intact to speech is clear he is alert and responsive.  Psych he appears to be  somewhat more alert and talkative than when I saw him previously appears to be doing a bit better in this regards.  Labs  02/11/2014.  WBC 5.3-hemoglobin 9.2-platelets 81,000.  Sodium 146 potassium 3.7 BUN 26 creatinine 1.45.  Assessment and plan.  #1-fever unspecified-with tachycardia and chills-at this point expect an acute septic process-several possible etiologies-he recently did have a PICC line removed-as well as a graft infection-I suspect patient is at risk of becoming  clinically unstable here in a relatively short period of time-this was discussed with Dr. Dellia Nims via phone-we will sent him to the ER for emergent evaluation.  [id number] note greater than 35 minutes spent assessing patient-and reassessing patient numerous times to ensure stability-as well as coordinating a plan of care-of note greater than 50% of time spent coordinating plan of care          e.     Marland Kitchen

## 2014-02-24 NOTE — Progress Notes (Signed)
ANTIBIOTIC CONSULT NOTE   Pharmacy Consult for Vancomycin &  Meropenem Indication: bacteremia  Allergies  Allergen Reactions  . Atorvastatin Other (See Comments)    Causes constipation  . Penicillins Other (See Comments)    Other reaction(s): RASH HIVES     Labs:  Recent Labs  02/21/14 1127 02/22/14 1000 02/23/14 0049 02/24/14 0300  WBC 3.4* 3.4* 2.7* 3.2*  HGB 9.5* 7.4* 7.3* 7.5*  PLT 103* 69* 67* 75*  CREATININE 1.38*  --  1.10 0.86   Estimated Creatinine Clearance: 85.4 mL/min (by C-G formula based on Cr of 0.86). No results for input(s): VANCOTROUGH, VANCOPEAK, VANCORANDOM, GENTTROUGH, GENTPEAK, GENTRANDOM, TOBRATROUGH, TOBRAPEAK, TOBRARND, AMIKACINPEAK, AMIKACINTROU, AMIKACIN in the last 72 hours.   Microbiology: Recent Results (from the past 720 hour(s))  Urine culture     Status: None   Collection Time: 02/06/14  2:55 PM  Result Value Ref Range Status   Specimen Description URINE, CATHETERIZED  Final   Special Requests NONE  Final   Culture  Setup Time   Final    02/06/2014 22:59 Performed at [place] Performed at [facility]   Final   Culture NO GROWTH Performed at [facility]   Final   Report Status 02/08/2014 FINAL  Final  Blood culture (routine x 2)     Status: None (Preliminary result)   Collection Time: 02/21/14 12:04 PM  Result Value Ref Range Status   Specimen Description BLOOD LEFT ANTECUBITAL  Final   Special Requests   Final    BOTTLES DRAWN AEROBIC AND ANAEROBIC 5CC  IMMUNE:COMPROMISED   Culture  Setup Time   Final    02/23/2014 00:32 Performed at [facility]    Culture   Final    PROTEUS SPECIES Note: Gram Stain Report Called to,Read Back By and Verified With: CARDONE T. AT 1400 ON 02/22/14 BY ISLEY B. Performed at [facility] Performed at [facility]    Report Status PENDING  Incomplete  Blood culture (routine x 2)     Status: None (Preliminary result)    Collection Time: 02/21/14 12:18 PM  Result Value Ref Range Status   Specimen Description BLOOD LEFT HAND  Final   Special Requests   Final    BOTTLES DRAWN AEROBIC AND ANAEROBIC 5CC  IMMUNE:COMPROMISED   Culture NO GROWTH 3 DAYS  Final   Report Status PENDING  Incomplete  MRSA PCR Screening     Status: None   Collection Time: 02/21/14  9:58 PM  Result Value Ref Range Status   MRSA by PCR NEGATIVE NEGATIVE Final    Comment:        The GeneXpert MRSA Assay (FDA approved for NASAL specimens only), is one component of a comprehensive MRSA colonization surveillance program. It is not intended to diagnose MRSA infection nor to guide or monitor treatment for MRSA infections.   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 This was complicated by a Pseudomonas graft infection.  Continues on Vancomycin (12/10> ) and Meropenem (12/11> ) Rifampin also on board BC x 1 now with Proteus species Afebrile, Scr stable, and WBC WNL  Goal of Therapy:  Vancomycin trough level 15-20 mcg/ml  Plan:  Continue Vancomycin  750 mg IV every 12 hours Trough pending final cultures and antibiotic plan Continue Meropenem 1 gram iv Q 12 hours  Thank you. Anette Guarneri, PharmD  02/24/2014,10:41 AM

## 2014-02-25 DIAGNOSIS — B964 Proteus (mirabilis) (morganii) as the cause of diseases classified elsewhere: Secondary | ICD-10-CM

## 2014-02-25 DIAGNOSIS — E43 Unspecified severe protein-calorie malnutrition: Secondary | ICD-10-CM

## 2014-02-25 LAB — CBC
HEMATOCRIT: 23.3 % — AB (ref 39.0–52.0)
HEMATOCRIT: 23.2 % — AB (ref 39.0–52.0)
HEMOGLOBIN: 7.7 g/dL — AB (ref 13.0–17.0)
HEMOGLOBIN: 7.8 g/dL — AB (ref 13.0–17.0)
MCH: 26.7 pg (ref 26.0–34.0)
MCH: 27.3 pg (ref 26.0–34.0)
MCHC: 33 g/dL (ref 30.0–36.0)
MCHC: 33.6 g/dL (ref 30.0–36.0)
MCV: 80.9 fL (ref 78.0–100.0)
MCV: 81.1 fL (ref 78.0–100.0)
Platelets: 87 10*3/uL — ABNORMAL LOW (ref 150–400)
Platelets: 87 10*3/uL — ABNORMAL LOW (ref 150–400)
RBC: 2.88 MIL/uL — AB (ref 4.22–5.81)
RBC: 2.86 MIL/uL — ABNORMAL LOW (ref 4.22–5.81)
RDW: 19.9 % — ABNORMAL HIGH (ref 11.5–15.5)
RDW: 20 % — ABNORMAL HIGH (ref 11.5–15.5)
WBC: 4.5 10*3/uL (ref 4.0–10.5)
WBC: 3.9 10*3/uL — AB (ref 4.0–10.5)

## 2014-02-25 LAB — CULTURE, BLOOD (ROUTINE X 2)

## 2014-02-25 LAB — BASIC METABOLIC PANEL
ANION GAP: 11 (ref 5–15)
BUN: 21 mg/dL (ref 6–23)
CHLORIDE: 102 meq/L (ref 96–112)
CO2: 23 meq/L (ref 19–32)
CREATININE: 0.88 mg/dL (ref 0.50–1.35)
Calcium: 8.6 mg/dL (ref 8.4–10.5)
GFR calc Af Amer: >90 mL/min (ref >90)
GFR calc non Af Amer: 78 mL/min — ABNORMAL LOW (ref >90)
Glucose, Bld: 98 mg/dL (ref 70–99)
POTASSIUM: 4.1 meq/L (ref 3.7–5.3)
Sodium: 136 mEq/L — ABNORMAL LOW (ref 137–147)

## 2014-02-25 LAB — HEMOGLOBIN AND HEMATOCRIT, BLOOD
HCT: 24.4 % — ABNORMAL LOW (ref 39.0–52.0)
Hemoglobin: 8.2 g/dL — ABNORMAL LOW (ref 13.0–17.0)

## 2014-02-25 NOTE — Progress Notes (Signed)
Patient OF:BPZWC E Aughenbaugh      DOB: 22-Mar-1929      HEN:[id number]   Palliative Medicine Team at [facility] Progress Note    Subjective: No complaints this morning.      Filed Vitals:   02/25/14 1311  BP: 111/60  Pulse: 66  Temp: 98.2 F (36.8 C)  Resp: 20   Physical exam: Gen: alert, NAD, very hard of hearing HEENT: [place], mmm   CBC    Component Value Date/Time   WBC 3.9* 02/25/2014 1147   WBC 5.8 12/26/2013 1105   WBC 5.4 02/28/2007 0919   RBC 2.86* 02/25/2014 1147   RBC 3.62* 02/21/2014 1127   RBC 4.10* 12/26/2013 1105   RBC 5.21 02/28/2007 0919   HGB 7.8* 02/25/2014 1147   HGB 11.0* 12/26/2013 1105   HGB 15.0 02/28/2007 0919   HCT 23.2* 02/25/2014 1147   HCT 34.5* 12/26/2013 1105   HCT 44.0 02/28/2007 0919   PLT 87* 02/25/2014 1147   PLT 157 12/26/2013 1105   PLT 153 02/28/2007 0919   MCV 81.1 02/25/2014 1147   MCV 84 12/26/2013 1105   MCV 84.4 02/28/2007 0919   MCH 27.3 02/25/2014 1147   MCH 26.8* 12/26/2013 1105   MCH 28.7 02/28/2007 0919   MCHC 33.6 02/25/2014 1147   MCHC 31.9* 12/26/2013 1105   MCHC 34.0 02/28/2007 0919   RDW 20.0* 02/25/2014 1147   RDW 17.5* 12/26/2013 1105   RDW 14.9* 02/28/2007 0919   LYMPHSABS 0.3* 02/21/2014 1127   LYMPHSABS 0.9 12/26/2013 1105   LYMPHSABS 1.6 02/28/2007 0919   MONOABS 0.1 02/21/2014 1127   MONOABS 0.6 02/28/2007 0919   EOSABS 0.0 02/21/2014 1127   EOSABS 0.1 12/26/2013 1105   EOSABS 0.3 02/28/2007 0919   BASOSABS 0.0 02/21/2014 1127   BASOSABS 0.0 12/26/2013 1105   BASOSABS 0.0 02/28/2007 0919   CMP     Component Value Date/Time   NA 136* 02/25/2014 0434   NA 140 10/31/2013 0839   K 4.1 02/25/2014 0434   K 3.9 10/31/2013 0839   CL 102 02/25/2014 0434   CL 103 10/31/2013 0839   CO2 23 02/25/2014 0434   CO2 27 10/31/2013 0839   GLUCOSE 98 02/25/2014 0434   GLUCOSE 93 10/31/2013 0839   BUN 21 02/25/2014 0434   BUN 17 10/31/2013 0839   CREATININE 0.88 02/25/2014 0434   CREATININE 1.1  10/31/2013 0839   CALCIUM 8.6 02/25/2014 0434   CALCIUM 8.9 10/31/2013 0839   PROT 5.5* 02/24/2014 0300   ALBUMIN 1.8* 02/24/2014 0300   AST 24 02/24/2014 0300   ALT 12 02/24/2014 0300   ALKPHOS 66 02/24/2014 0300   BILITOT 0.6 02/24/2014 0300   GFRNONAA 78* 02/25/2014 0434   GFRAA >90 02/25/2014 0434     Assessment and plan: 80 yo male with 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   1. Code Status: Full  2. GOC: Did not connect with family for original meeting but eventually caught up with Brenda's 2 sisters. [name] remains out of town.  Reviewed Mr Osmanovic's case with them.  They had been introduced to idea of Palliative care by ID physician at [facility]. It sounds to me that this was described as hospice care.  Hospice care would come with stopping re-admissions for IV abx and focusing on comfort which they are clearly not interested in at this point. They do want to get him home "hopefullly by christmas".  In doing so they would want to  pursue home health and home PT in restorative efforts. They feel like previous nursing home did not do much to work with him PT wise and family would be more encouraging and able to get him to "do more PT" at home.  Multiple physicians have expressed concerns about how he will do in long-term, but family is hopeful he can get through these acute issues with less setbacks.  When I talked about how concerning multiple setbacks can be in a persons ability to recover from such serious illness, one sister replied "there has not been many).  Another sister actually challenged that and counted/described 6 such episodes where things had not gone as they hoped.    Interestingly has proteus on blood cultures, ID to follow-up on sensitivities.  It seems he will need lifelong suppressive therapy. Family interested in CT to eval graft.  We will continue to work with family.   3. Symptom Management:  Weight  Loss/FTT- PEG placed 11/30.Ongoing challenges as above.  Speech therapy to eval his ability to take oral safely.     4. Psychosocial/Spiritual: From [place]. Has 3 daughters and 1 son. Has been hospitalized or in nursing facility for past several months.   Total Time: 30 minutes  >50% of time spent in counseling and coordination of care regarding above.    Doran Clay D.O. Palliative Medicine Team at [facility]  Pager: [phone] Team Phone: [phone]

## 2014-02-25 NOTE — Progress Notes (Signed)
I arranged to meet family at [place] today.  No family at bedside and I am unable to reach daughter [name] by phone.  I will try to see later. I have several other meetings arranged today, but can try to work with family if another time works for them.  Doran Clay D.O. Palliative Medicine Team at [facility]  Pager: [phone] Team Phone: [phone]

## 2014-02-25 NOTE — Progress Notes (Signed)
Medicare Important Message given?  YES (If response is "NO", the following Medicare IM given date fields will be blank) Date Medicare IM given:  02/25/14 Medicare IM given by:  Tomi Bamberger

## 2014-02-25 NOTE — Progress Notes (Signed)
[name]  [name] UUV:[id number] DOB: [birth year] DOA: 02/21/2014 PCP: [name], MD  Brief narrative: 80 year old male patient with a very complex past medical history. He has a known left lower extremity DVT dating back to Feb 2015 but had to stop anticoagulation because of gross hematuria and subsequently an IVC filter was placed. In July 2015 (at [facility]) he underwent repair of a thoraco-abdominal aortic aneurysm but unfortunately this procedure was complicated by a Pseudomonas graft infection. He had a PICC line placed and received IV cefepime. He was dc'd to a nursing facility. He was dx'd with dysphagia and poor oral intake with severe DH during a hospitalization in early December of this year. A PEG tube was placed at that time.  In regards to the Pseudomonas graft infection with bacteremia, this was initially diagnosed in October 2015 and was pansensitive. He has been followed by infectious disease service from [facility] since that time. His PICC line was removed on 12/9 and antibiotics were changed to Levaquin per PEG.   He presented to [facility] on 12/10 with a temperature of 103.3. This was associated with hypotension. He was empirically started on vancomycin and cefepime. His chest x-ray was unremarkable. He had a mild leukopenia with a white cell count of 3400. Urinalysis was not consistent with UTI. Plans were to initially to send the patient to [facility] to resume treatment.  In the interim the family decided that they would like to be transferred to [facility] and request consultation with ID physicians there. He was transferred via ambulance to [facility] and upon arrival was hemodynamically stable and was not hypoxic.  HPI/Subjective: Patient denies abdominal pain, nausea or vomiting. He now agree to have foley catheter place. He started to have hematuria overnight.   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  Now with urine retention. Will need foley placement. Hold aspirin and plavix. Repeat hb tonight.   Anemia Presenting hemoglobin 9.5 - after hydration hemoglobin nadir 7.3 - transfuse as needed to keep Hgb 7.0 or > - holding steady for now. Repeat hb again tonight due to hematuria.   HTN BP improved . Metoprolol resume.   Thrombocytopenia chronic thrombocytopenia but presenting levels were lower than his baseline likely due to gram-negative infection - plt count is now improving   Stage III chronic kidney disease Baseline renal function BUN 26 creatinine 1.41 - creatinine now better than baseline s/p volume resuscitation - follow - GFR now normal   History of DVT of lower extremity (Feb 2015) - post IVC filter no lower extremity edema  Protein-calorie malnutrition, severe PEG tube feeds to continue - lytes stable   Dysphagia/Status post insertion of PEG tube started on tube feedings during last admission; continue Jevity - able to tolerate oral intake for pleasure  Speech evaluation to define diet.   DVT prophylaxis: SCDs + prior IVC filter Code Status: Full Family Communication: spoke w/ daughters at bedside  Disposition Plan/Expected LOS:  ID to advise on length of abx course and  agent to be used . Awaiting pallitive care meeting.   Consultants: Infectious Disease  Procedures: None  Antibiotics: Cefepime 12/10 > 12/11 Vancomycin 12/10 > Levaquin 12/10 Meropenem 12/11 > Rifampin 12/11 >  Objective: Blood pressure 111/60, pulse 66,  temperature 98.2 F (36.8 C), temperature source Oral, resp. rate 20, height 6\' 2"  (1.88 m), weight 99.292 kg (218 lb 14.4 oz), SpO2 99 %.  Intake/Output Summary (Last 24 hours) at 02/25/14 1434 Last data filed at 02/25/14 1353  Gross per 24 hour  Intake    160 ml  Output   1700 ml  Net  -1540 ml   Exam: Gen: No acute respiratory distress - alert and conversant  Chest: CTA B w/o wheeze or focal crackles  Cardiac: regular rate - no appreciable gallup or rub - 3/6 systolic M Abdomen: Soft nontender nondistended without obvious hepatosplenomegaly, no ascites; PEG tube site unremarkable Extremities: no signif C/C/E b LE   Scheduled Meds:  Scheduled Meds: . doxazosin  4 mg Per Tube QHS  . free water  100 mL Per Tube 3 times per day  . meropenem (MERREM) IV  1 g Intravenous Q12H  . methylphenidate  5 mg Per Tube Daily  . metoprolol tartrate  25 mg Per Tube BID  . mirtazapine  30 mg Per Tube QHS  . pantoprazole sodium  40 mg Per Tube Daily  . polyethylene glycol  17 g Per Tube Daily  . rifampin  300 mg Oral Q12H   Continuous Infusions: . sodium chloride 10 mL/hr at 02/24/14 1444  . feeding supplement (JEVITY 1.2 CAL) 1,000 mL (02/25/14 0144)    Data Reviewed: Basic Metabolic Panel:  Recent Labs Lab 02/21/14 1127 02/23/14 0049 02/24/14 0300 02/25/14 0434  NA 139 138 141 136*  K 4.3 3.9 3.8 4.1  CL 100 107 107 102  CO2 23 19 23 23   GLUCOSE 91 107* 109* 98  BUN 41* 31* 23 21  CREATININE 1.38* 1.10 0.86 0.88  CALCIUM 9.5 8.6 8.7 8.6  MG  --  1.8  --   --   PHOS  --  2.4  --   --    Liver Function Tests:  Recent Labs Lab 02/21/14 1127 02/23/14 0049 02/24/14 0300  AST 26 24 24   ALT 13 13 12   ALKPHOS 69 52 66  BILITOT 0.6 0.4 0.6  PROT 7.0 5.4* 5.5*  ALBUMIN 2.5* 1.7* 1.8*   CBC:  Recent Labs Lab 02/21/14 1127 02/22/14 1000 02/23/14 0049 02/24/14 0300 02/25/14 0434 02/25/14 1147  WBC 3.4* 3.4* 2.7* 3.2* 4.5 3.9*  NEUTROABS 3.0  --   --   --   --   --     HGB 9.5* 7.4* 7.3* 7.5* 7.7* 7.8*  HCT 29.5* 22.7* 22.0* 23.0* 23.3* 23.2*  MCV 82.6 82.8 82.4 81.0 80.9 81.1  PLT 103* 69* 67* 75* 87* 87*    Recent Results (from the past 240 hour(s))  Blood culture (routine x 2)     Status: None   Collection Time: 02/21/14 12:04 PM  Result Value Ref Range Status   Specimen Description BLOOD LEFT ANTECUBITAL  Final   Special Requests   Final    BOTTLES DRAWN AEROBIC AND ANAEROBIC 5CC  IMMUNE:COMPROMISED   Culture  Setup Time   Final    02/23/2014 00:32 Performed at [facility]    Culture   Final    PROTEUS MIRABILIS Note: Gram Stain Report Called to,Read Back By and Verified With: CARDONE T. AT  1400 ON 02/22/14 BY ISLEY B. Performed at [facility] Performed at [facility]    Report Status 02/25/2014 FINAL  Final   Organism ID, Bacteria PROTEUS MIRABILIS  Final      Susceptibility   Proteus mirabilis - MIC*    AMPICILLIN <=2 SENSITIVE Sensitive     AMPICILLIN/SULBACTAM <=2 SENSITIVE Sensitive     CEFAZOLIN <=4 SENSITIVE Sensitive     CEFEPIME <=1 SENSITIVE Sensitive     CEFTAZIDIME <=1 SENSITIVE Sensitive     CEFTRIAXONE <=1 SENSITIVE Sensitive     CIPROFLOXACIN >=4 RESISTANT Resistant     GENTAMICIN >=16 RESISTANT Resistant     IMIPENEM 1 SENSITIVE Sensitive     PIP/TAZO <=4 SENSITIVE Sensitive     TOBRAMYCIN 8 INTERMEDIATE Intermediate     TRIMETH/SULFA >=320 RESISTANT Resistant     * PROTEUS MIRABILIS  Blood culture (routine x 2)     Status: None (Preliminary result)   Collection Time: 02/21/14 12:18 PM  Result Value Ref Range Status   Specimen Description BLOOD LEFT HAND  Final   Special Requests   Final    BOTTLES DRAWN AEROBIC AND ANAEROBIC 5CC  IMMUNE:COMPROMISED   Culture NO GROWTH 4 DAYS  Final   Report Status PENDING  Incomplete  MRSA PCR Screening     Status: None   Collection Time: 02/21/14  9:58 PM  Result Value Ref Range Status   MRSA by PCR NEGATIVE NEGATIVE Final    Comment:        The  GeneXpert MRSA Assay (FDA approved for NASAL specimens only), is one component of a comprehensive MRSA colonization surveillance program. It is not intended to diagnose MRSA infection nor to guide or monitor treatment for MRSA infections.   Urine culture     Status: None   Collection Time: 02/22/14 10:29 AM  Result Value Ref Range Status   Specimen Description URINE, CATHETERIZED  Final   Special Requests Normal  Final   Culture  Setup Time   Final    02/22/2014 17:17 Performed at [facility] Performed at [facility]   Final   Culture NO GROWTH Performed at [facility]   Final   Report Status 02/23/2014 FINAL  Final     Studies:  Recent x-ray studies have been reviewed in detail by the Attending Physician  Time spent :  35 mins   Niel Hummer, MD [facility] For Consults/Admissions - Flow Manager - [phone] Office  [phone] Pager [phone]  On-Call/Text Page:      [email or web address]      password [facility]  02/25/2014, 2:34 PM   LOS: 4 days

## 2014-02-25 NOTE — Evaluation (Signed)
Physical Therapy Evaluation Patient Details Name: [name] MRN: [id number] DOB: 09-19-1929 Today's Date: 02/25/2014   History of Present Illness  [name] is a 80 y.o. male who presents to the Emergency Department from nursing home with fever and tachycardia.. The facility suspected sepsis. He had a PICC line removed yesterday. 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 He also has a feeding tube on his abdomen.  Clinical Impression  Pt admitted with/for possible sepsis due to recurrent Pseudomonas infection..  Pt currently limited functionally due to the problems listed. ( See problems list.)   Pt will benefit from PT to maximize function and safety in order to get ready for next venue listed below.     Follow Up Recommendations SNF    Equipment Recommendations  None recommended by PT    Recommendations for Other Services       Precautions / Restrictions Precautions Precautions: Fall Precaution Comments: HOB needs to be at 30 degrees when receiving tube feeding Restrictions Weight Bearing Restrictions: No      Mobility  Bed Mobility Overal bed mobility: Needs Assistance Bed Mobility: Supine to Sit     Supine to sit: Mod assist     General bed mobility comments: pt scooted his legs off the bed, but needed assist to come up onto L  elbovw  Transfers Overall transfer level: Needs assistance   Transfers: Sit to/from Stand;Lateral/Scoot Transfers Sit to Stand: Mod assist        Lateral/Scoot Transfers: Mod assist (more effort needed the more he scooted.) General transfer comment: Pt needed assist to come forward and assist with lift to stand; side scooting more difficult with need to help pt forward to clear his rear end from bed.  Ambulation/Gait                Stairs            Wheelchair Mobility    Modified Rankin  (Stroke Patients Only)       Balance Overall balance assessment: Needs assistance Sitting-balance support: No upper extremity supported Sitting balance-Leahy Scale: Fair Sitting balance - Comments: sat EOB without UE, but did not handle challenge.   Standing balance support: Bilateral upper extremity supported Standing balance-Leahy Scale: Poor Standing balance comment: stood x2 trials into the RW.  Stood with mod assist.  Patient was not yet willing to w/shift or take steps, but maintained fully upright stance.                             Pertinent Vitals/Pain Pain Assessment: Faces Faces Pain Scale: Hurts little more Pain Location: R knee pain. Pain Intervention(s): Limited activity within patient's tolerance;Monitored during session    [place] expects to be discharged to:: Skilled nursing facility                 Additional Comments: Does not want to go to rehab again, states wife is unable to help, daughter lives nex door.  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     Hand Dominance        Extremity/Trunk Assessment   Upper Extremity Assessment: Generalized weakness           Lower Extremity Assessment: Generalized weakness         Communication   Communication: HOH  Cognition Arousal/Alertness: Awake/alert Behavior During Therapy: WFL for tasks assessed/performed Overall Cognitive Status: No family/caregiver present to determine baseline cognitive functioning                      General Comments      Exercises  General Exercises - Lower Extremity Ankle Circles/Pumps: AROM;Both;10 reps;Supine Heel Slides: AROM;10 reps;Supine      Assessment/Plan    PT Assessment Patient needs continued PT services  PT Diagnosis Generalized weakness   PT Problem List Decreased strength;Decreased activity tolerance;Decreased balance;Decreased mobility;Decreased knowledge of use of DME;Pain  PT Treatment Interventions Gait training;Functional mobility training;Therapeutic activities;Therapeutic exercise;Balance training;Patient/family education;DME instruction   PT Goals (Current goals can be found in the Care Plan section) Acute Rehab PT Goals Patient Stated Goal: none stated PT Goal Formulation: With patient Time For Goal Achievement: 03/11/14 Potential to Achieve Goals: Fair    Frequency Min 3X/week   Barriers to discharge        Co-evaluation               End of Session   Activity Tolerance: Patient limited by fatigue;Patient tolerated treatment well Patient left: in bed;with call bell/phone within reach;with bed alarm set;with nursing/sitter in room Nurse Communication: Mobility status         Time: 2878-6767 PT Time Calculation (min) (ACUTE ONLY): 23 min   Charges:   PT Evaluation $Initial PT Evaluation Tier I: 1 Procedure PT Treatments $Therapeutic Activity: 8-22 mins   PT G Codes:          Zuriah Bordas, Tessie Fass 02/25/2014, 3:12 PM  02/25/2014  Donnella Sham, PT [phone]  (pager)

## 2014-02-25 NOTE — Progress Notes (Signed)
Received call from Dr. Tyrell Antonio regarding evaluation. Orders received to initiate a dysphagia 3 diet with thin liquids. RN and SLP to monitor intake for overt s/s of aspiration. Please provide full supervision with pos.   [place], [place] [phone]

## 2014-02-25 NOTE — Progress Notes (Addendum)
Patient had small incontinence episode in bed,red tinged with small blood clots. Patient bladder scanned and resulted 349ml in bladder. Dr. Tyrell Antonio notified. Order on 1-2  Hours encourage patient to urinate and bladder scan, and if patient still retaining over 373ml in bladder then to insert a foley catheter.

## 2014-02-25 NOTE — Evaluation (Signed)
Clinical/Bedside Swallow Evaluation Patient Details  Name: [name] MRN: [id number] Date of Birth: [birth year]  Today's Date: 02/25/2014 Time: 3500-9381 SLP Time Calculation (min) (ACUTE ONLY): 15 min  Past Medical History:  Past Medical History  Diagnosis Date  . Hypertension   . BPH (benign prostatic hyperplasia)   . Hyperlipidemia   . Cancer 2008-2009    prostate TREATED WITH RADIATION  . AAA (abdominal aortic aneurysm) 04/2012    STENTING OF AAA IN [place]  . Junctional cardiac arrhythmia     Occurred postoperatively after urologic surgery  . Severe sinus bradycardia     Occurred postoperatively after urologic surgery  . Aortic valve disorders   . Other primary cardiomyopathies   . Dysphagia   . Status post insertion of percutaneous endoscopic gastrostomy (PEG) tube 02/11/14  . Bacteremia due to Pseudomonas   . Infected aortic graft    Past Surgical History:  Past Surgical History  Procedure Laterality Date  . Abdominal surgery      PART OF COLON REMOVED FOR DIVERTICULITIS  . Appendectomy    . Hernia repair    . Bladder surgery  2008    FOR BLADDER STONE  . Total knee arthroplasty  1990    left  . Abdominal aortic aneurysm repair  04/2012  . Total knee arthroplasty Right 07/26/2012    Procedure: RIGHT TOTAL KNEE ARTHROPLASTY;  Surgeon: Tobi Bastos, MD;  Location: [facility];  Service: Orthopedics;  Laterality: Right;  . Nephrolithotomy Left 04/26/2013    Procedure: LEFT PERCUTANEOUS NEPHROLITHOTOMY ;  Surgeon: [name], MD;  Location: [facility];  Service: Urology;  Laterality: Left;  . Nephrolithotomy Left 05/03/2013    Procedure: 2ND STAGE LEFT PERCUTANEOUS NEPHROLITHOTOMY ;  Surgeon: [name], MD;  Location: [facility];  Service: Urology;  Laterality: Left;  . Esophagogastroduodenoscopy N/A 02/11/2014    Procedure: ESOPHAGOGASTRODUODENOSCOPY (EGD);  Surgeon: Daneil Dolin, MD;  Location: [facility];  Service: Endoscopy;  Laterality: N/A;  With PEG  placement  . Peg placement N/A 02/11/2014    Procedure: PERCUTANEOUS ENDOSCOPIC GASTROSTOMY (PEG) PLACEMENT;  Surgeon: Daneil Dolin, MD;  Location: [facility];  Service: Endoscopy;  Laterality: N/A;   HPI:  This 80 year old male was admitted through [facility]. The patient has a history of thoracicoabdominal aneurysm repair in July. Complicated by Pseudomonas graft infection. Patient admitted with septic shock and recurrent Pseudomonas bacteremia from the infected aortic graft. Patient with a h/o mild dysphagia although MBS complete 01/23/14 recommended a mechanical soft diet with thin liquids. It appears that PEG tube was placed in December primarily due to poor po intake/dehydration.    Assessment / Plan / Recommendation Clinical Impression  Swallow evaluation complete but limited to minimal sips of thin liquid due to patient cooperation and refusal to consume additional boluses/consistencies. Oral motor exam WFL and overall, oropharyngeal swallow appears intact with trials observed. Most recent MBS indicating a functional swallow with recommendations for Dysphagia 3, thin liquids. Doubtful that their has been a significant change in function since this exam although small decline possible with acute illness. SLP will plan to f/u although prognosis for improved participation guarded. MD, may wish to consider initiation of dysphagia 3 diet with thin liquids and monitor for tolerance at bedside as intake likely to be sporadic given recent need for PEG. WIll f/u 12/15.     Aspiration Risk       Diet Recommendation Dysphagia 3 (Mechanical Soft);Thin liquid   Liquid Administration via: Cup;Straw  Medication Administration: Whole meds with liquid Supervision: Patient able to self feed;Full supervision/cueing for compensatory strategies Compensations: Slow rate;Small sips/bites Postural Changes and/or Swallow Maneuvers: Seated upright 90 degrees    Other  Recommendations Oral Care  Recommendations: Oral care BID   Follow Up Recommendations  None    Frequency and Duration min 2x/week  1 week   Pertinent Vitals/Pain n/a        Swallow Study    General HPI: This 80 year old male was admitted through [facility]. The patient has a history of thoracicoabdominal aneurysm repair in July. Complicated by Pseudomonas graft infection. Patient admitted with septic shock and recurrent Pseudomonas bacteremia from the infected aortic graft. Patient with a h/o mild dysphagia although MBS complete 01/23/14 recommended a mechanical soft diet with thin liquids. It appears that PEG tube was placed in December primarily due to poor po intake/dehydration.  Type of Study: Bedside swallow evaluation Previous Swallow Assessment: see HPI Diet Prior to this Study: NPO;PEG tube Temperature Spikes Noted: Yes Respiratory Status: Room air History of Recent Intubation: No Behavior/Cognition: Alert;Uncooperative;Hard of hearing Oral Cavity - Dentition: Dentures, top;Dentures, bottom Self-Feeding Abilities: Able to feed self Patient Positioning: Upright in bed Baseline Vocal Quality: Clear Volitional Cough: Strong Volitional Swallow: Able to elicit    Oral/Motor/Sensory Function Overall Oral Motor/Sensory Function: Appears within functional limits for tasks assessed   Ice Chips Ice chips: Not tested   Thin Liquid Thin Liquid: Within functional limits Presentation: Self Fed;Straw    Nectar Thick Nectar Thick Liquid: Not tested   Honey Thick Honey Thick Liquid: Not tested   Puree Puree: Not tested   Solid   GO   Taheera Thomann MA, CCC-SLP [phone]  Solid: Not tested       Erminia Mcnew Meryl 02/25/2014,3:39 PM

## 2014-02-25 NOTE — Progress Notes (Signed)
INITIAL NUTRITION ASSESSMENT  DOCUMENTATION CODES Per approved criteria  -Not Applicable   INTERVENTION: Continue Jevity 1.2 @ 60 ml/hr via PEG tube.  30 ml Prostat TID.    Tube feeding regimen provides 2028 kcal (100% of needs), 125 grams of protein, and 1162 ml of H2O.   Recommend free water flushes of 300 mL TID to provide additional 900 mL of fluid.  NUTRITION DIAGNOSIS: Inadequate oral intake related to inability to eat as evidenced by NPO.   Goal: Pt to meet >/= 90% of their estimated nutrition needs   Monitor:  Weight trend, toleration of TF, bowel movements, labs  Reason for Assessment: New TF  80 y.o. male  Admitting Dx: Recurrent bacteremia  ASSESSMENT: 80 year old male patient with a very complex past medical history. He has a known left lower extremity DVT dating back to Feb 2015 but had to stop anticoagulation because of gross hematuria and subsequently an IVC filter was placed. In July 2015 (at [facility]) he underwent repair of a thoraco-abdominal aortic aneurysm but unfortunately this procedure was complicated by a Pseudomonas graft infection. He had a PICC line placed and received IV cefepime. He was dc'd to a nursing facility. He was dx'd with dysphagia and poor oral intake with severe DH during a hospitalization in early December of this year. A PEG tube was placed at that time.  Pt presented to [facility] with a fever and Pseudomonas graft infection with bacteremia 12/10. Transferred to [facility] 12/11.    Spoke with RN and pt is currently tolerating TF regimen at goal rate. Pt with no nausea or vomiting. He has had some diarrhea, but RN attributes this to stool softeners which the pt wants to stay on. Pt asleep during RD visit. Pt with no signs of fat or muscle depletion.   Palliative care consulted. Meeting scheduled for today at 10 am, but there was no family at pt's bedside. Meeting to be re-scheduled.   Labs: Na low K WNL BUN WNL Mg and Phos  WNL  Height: Ht Readings from Last 1 Encounters:  02/21/14 6\' 2"  (1.88 m)    Weight: Wt Readings from Last 1 Encounters:  02/25/14 218 lb 14.4 oz (99.292 kg)    Ideal Body Weight: 82.2 kg  % Ideal Body Weight: 121%  Wt Readings from Last 10 Encounters:  02/25/14 218 lb 14.4 oz (99.292 kg)  02/13/14 219 lb 12.8 oz (99.7 kg)  01/11/14 240 lb (108.863 kg)  01/02/14 240 lb (108.863 kg)  10/31/13 256 lb (116.121 kg)  08/03/13 280 lb 12.8 oz (127.37 kg)  07/20/13 277 lb (125.646 kg)  07/05/13 265 lb 14.4 oz (120.611 kg)  06/29/13 277 lb (125.646 kg)  05/18/13 259 lb (117.482 kg)   BMI:  Body mass index is 28.09 kg/(m^2).  Estimated Nutritional Needs: Kcal: 2000-2200 Protein: 120-135 g Fluid: 2.0-2.2 L/day  Skin: Intact  Diet Order: Diet NPO time specified Except for: Other (See Comments), Sips with Meds  EDUCATION NEEDS: -Education not appropriate at this time   Intake/Output Summary (Last 24 hours) at 02/25/14 1348 Last data filed at 02/25/14 1154  Gross per 24 hour  Intake     60 ml  Output   1450 ml  Net  -1390 ml    Last BM: 12/14   Labs:   Recent Labs Lab 02/23/14 0049 02/24/14 0300 02/25/14 0434  NA 138 141 136*  K 3.9 3.8 4.1  CL 107 107 102  CO2 19 23 23   BUN  31* 23 21  CREATININE 1.10 0.86 0.88  CALCIUM 8.6 8.7 8.6  MG 1.8  --   --   PHOS 2.4  --   --   GLUCOSE 107* 109* 98    CBG (last 3)  No results for input(s): GLUCAP in the last 72 hours.  Scheduled Meds: . doxazosin  4 mg Per Tube QHS  . free water  100 mL Per Tube 3 times per day  . meropenem (MERREM) IV  1 g Intravenous Q12H  . methylphenidate  5 mg Per Tube Daily  . metoprolol tartrate  25 mg Per Tube BID  . mirtazapine  30 mg Per Tube QHS  . pantoprazole sodium  40 mg Per Tube Daily  . polyethylene glycol  17 g Per Tube Daily  . rifampin  300 mg Oral Q12H    Continuous Infusions: . sodium chloride 10 mL/hr at 02/24/14 1444  . feeding supplement (JEVITY 1.2 CAL)  1,000 mL (02/25/14 0144)    Past Medical History  Diagnosis Date  . Hypertension   . BPH (benign prostatic hyperplasia)   . Hyperlipidemia   . Cancer 2008-2009    prostate TREATED WITH RADIATION  . AAA (abdominal aortic aneurysm) 04/2012    STENTING OF AAA IN [place]  . Junctional cardiac arrhythmia     Occurred postoperatively after urologic surgery  . Severe sinus bradycardia     Occurred postoperatively after urologic surgery  . Aortic valve disorders   . Other primary cardiomyopathies   . Dysphagia   . Status post insertion of percutaneous endoscopic gastrostomy (PEG) tube 02/11/14  . Bacteremia due to Pseudomonas   . Infected aortic graft     Past Surgical History  Procedure Laterality Date  . Abdominal surgery      PART OF COLON REMOVED FOR DIVERTICULITIS  . Appendectomy    . Hernia repair    . Bladder surgery  2008    FOR BLADDER STONE  . Total knee arthroplasty  1990    left  . Abdominal aortic aneurysm repair  04/2012  . Total knee arthroplasty Right 07/26/2012    Procedure: RIGHT TOTAL KNEE ARTHROPLASTY;  Surgeon: Tobi Bastos, MD;  Location: [facility];  Service: Orthopedics;  Laterality: Right;  . Nephrolithotomy Left 04/26/2013    Procedure: LEFT PERCUTANEOUS NEPHROLITHOTOMY ;  Surgeon: [name], MD;  Location: [facility];  Service: Urology;  Laterality: Left;  . Nephrolithotomy Left 05/03/2013    Procedure: 2ND STAGE LEFT PERCUTANEOUS NEPHROLITHOTOMY ;  Surgeon: [name], MD;  Location: [facility];  Service: Urology;  Laterality: Left;  . Esophagogastroduodenoscopy N/A 02/11/2014    Procedure: ESOPHAGOGASTRODUODENOSCOPY (EGD);  Surgeon: Daneil Dolin, MD;  Location: [facility];  Service: Endoscopy;  Laterality: N/A;  With PEG placement  . Peg placement N/A 02/11/2014    Procedure: PERCUTANEOUS ENDOSCOPIC GASTROSTOMY (PEG) PLACEMENT;  Surgeon: Daneil Dolin, MD;  Location: [facility];  Service: Endoscopy;  Laterality: N/A;    Laurette Schimke MS, RD,  LDN

## 2014-02-25 NOTE — Progress Notes (Signed)
MD Rogue Bussing paged re pt is passing large blood clots in urine and continues to void cherry colored.

## 2014-02-25 NOTE — Progress Notes (Addendum)
INFECTIOUS DISEASE PROGRESS NOTE  ID: [name] is a 80 y.o. 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   CKD (chronic kidney disease), stage III   Palliative care encounter  Subjective: Without complaints  Abtx:  Anti-infectives    Start     Dose/Rate Route Frequency Ordered Stop   02/22/14 1200  rifampin (RIFADIN) capsule 300 mg     300 mg Oral Every 12 hours 02/22/14 1108     02/22/14 1130  meropenem (MERREM) 1 g in sodium chloride 0.9 % 100 mL IVPB     1 g200 mL/hr over 30 Minutes Intravenous Every 12 hours 02/22/14 1119     02/22/14 1000  ceFEPIme (MAXIPIME) 2 g in dextrose 5 % 50 mL IVPB  Status:  Discontinued     2 g100 mL/hr over 30 Minutes Intravenous Every 12 hours 02/22/14 0833 02/22/14 1108   02/22/14 0500  vancomycin (VANCOCIN) IVPB 750 mg/150 ml premix  Status:  Discontinued     750 mg150 mL/hr over 60 Minutes Intravenous Every 12 hours 02/21/14 1622 02/24/14 1552   02/21/14 1800  ceFEPIme (MAXIPIME) 2 g in dextrose 5 % 50 mL IVPB  Status:  Discontinued     2 g100 mL/hr over 30 Minutes Intravenous Every 8 hours 02/21/14 1622 02/22/14 0833   02/21/14 1700  vancomycin (VANCOCIN) IVPB 1000 mg/200 mL premix  Status:  Discontinued     1,000 mg200 mL/hr over 60 Minutes Intravenous  Once 02/21/14 1622 02/21/14 1707   02/21/14 1315  levofloxacin (LEVAQUIN) IVPB 750 mg     750 mg100 mL/hr over 90 Minutes Intravenous  Once 02/21/14 1310 02/21/14 1525      Medications:  Scheduled: . doxazosin  4 mg Per  Tube QHS  . free water  100 mL Per Tube 3 times per day  . meropenem (MERREM) IV  1 g Intravenous Q12H  . methylphenidate  5 mg Per Tube Daily  . metoprolol tartrate  25 mg Per Tube BID  . mirtazapine  30 mg Per Tube QHS  . pantoprazole sodium  40 mg Per Tube Daily  . polyethylene glycol  17 g Per Tube Daily  . rifampin  300 mg Oral Q12H    Objective: Vital signs in last 24 hours: Temp:  [98.2 F (36.8 C)-99.9 F (37.7 C)] 98.2 F (36.8 C) (12/14 1311) Pulse Rate:  [57-72] 66 (12/14 1311) Resp:  [18-20] 20 (12/14 1311) BP: (111-136)/(60-91) 111/60 mmHg (12/14 1311) SpO2:  [98 %-100 %] 99 % (12/14 1311) Weight:  [99.292 kg (218 lb 14.4 oz)] 99.292 kg (218 lb 14.4 oz) (12/14 0508)   General appearance: alert, cooperative, no distress and pale Resp: clear to auscultation bilaterally Cardio: regular rate and rhythm GI: normal findings: bowel sounds normal and soft, non-tender  Lab Results  Recent Labs  02/24/14 0300 02/25/14 0434 02/25/14 1147  WBC 3.2* 4.5 3.9*  HGB 7.5* 7.7* 7.8*  HCT 23.0* 23.3* 23.2*  NA 141 136*  --   K 3.8 4.1  --  CL 107 102  --   CO2 23 23  --   BUN 23 21  --   CREATININE 0.86 0.88  --    Liver Panel  Recent Labs  02/23/14 0049 02/24/14 0300  PROT 5.4* 5.5*  ALBUMIN 1.7* 1.8*  AST 24 24  ALT 13 12  ALKPHOS 52 66  BILITOT 0.4 0.6   Sedimentation Rate No results for input(s): ESRSEDRATE in the last 72 hours. C-Reactive Protein No results for input(s): CRP in the last 72 hours.  Microbiology: Recent Results (from the past 240 hour(s))  Blood culture (routine x 2)     Status: None   Collection Time: 02/21/14 12:04 PM  Result Value Ref Range Status   Specimen Description BLOOD LEFT ANTECUBITAL  Final   Special Requests   Final    BOTTLES DRAWN AEROBIC AND ANAEROBIC 5CC  IMMUNE:COMPROMISED   Culture  Setup Time   Final    02/23/2014 00:32 Performed at [facility]    Culture   Final    PROTEUS MIRABILIS Note: Gram  Stain Report Called to,Read Back By and Verified With: CARDONE T. AT 1400 ON 02/22/14 BY ISLEY B. Performed at [facility] Performed at [facility]    Report Status 02/25/2014 FINAL  Final   Organism ID, Bacteria PROTEUS MIRABILIS  Final      Susceptibility   Proteus mirabilis - MIC*    AMPICILLIN <=2 SENSITIVE Sensitive     AMPICILLIN/SULBACTAM <=2 SENSITIVE Sensitive     CEFAZOLIN <=4 SENSITIVE Sensitive     CEFEPIME <=1 SENSITIVE Sensitive     CEFTAZIDIME <=1 SENSITIVE Sensitive     CEFTRIAXONE <=1 SENSITIVE Sensitive     CIPROFLOXACIN >=4 RESISTANT Resistant     GENTAMICIN >=16 RESISTANT Resistant     IMIPENEM 1 SENSITIVE Sensitive     PIP/TAZO <=4 SENSITIVE Sensitive     TOBRAMYCIN 8 INTERMEDIATE Intermediate     TRIMETH/SULFA >=320 RESISTANT Resistant     * PROTEUS MIRABILIS  Blood culture (routine x 2)     Status: None (Preliminary result)   Collection Time: 02/21/14 12:18 PM  Result Value Ref Range Status   Specimen Description BLOOD LEFT HAND  Final   Special Requests   Final    BOTTLES DRAWN AEROBIC AND ANAEROBIC 5CC  IMMUNE:COMPROMISED   Culture NO GROWTH 4 DAYS  Final   Report Status PENDING  Incomplete  MRSA PCR Screening     Status: None   Collection Time: 02/21/14  9:58 PM  Result Value Ref Range Status   MRSA by PCR NEGATIVE NEGATIVE Final    Comment:        The GeneXpert MRSA Assay (FDA approved for NASAL specimens only), is one component of a comprehensive MRSA colonization surveillance program. It is not intended to diagnose MRSA infection nor to guide or monitor treatment for MRSA infections.   Urine culture     Status: None   Collection Time: 02/22/14 10:29 AM  Result Value Ref Range Status   Specimen Description URINE, CATHETERIZED  Final   Special Requests Normal  Final   Culture  Setup Time   Final    02/22/2014 17:17 Performed at [place] Performed at [facility]   Final    Culture NO GROWTH Performed at [facility]   Final   Report Status 02/23/2014 FINAL  Final    Studies/Results: No results found.   Assessment/Plan: Vascular Graft  Infection Previous pseudomonas Current Proteus bacteremia Protein-calorie malnutrition, severe Total days of antibiotics: 4 rifampin/merrem        Not clear how he has unique bacteria/different from previous pseudomonas Would continue his current anbx Await CT chest/abd/pelvis, will need contrast   [name] Infectious Diseases (pager) [phone] [email or web address] 02/25/2014, 5:07 PM  LOS: 4 days

## 2014-02-25 NOTE — Progress Notes (Signed)
OT Cancellation Note  Patient Details Name: [name] MRN: [id number] DOB: 03-26-1929   Cancelled Treatment:    Reason Eval/Treat Not Completed: OT screened. Pt is from SNF and current D/C plan is tor return to SNF. No apparent immediate acute care OT needs, therefore will defer OT to SNF. If OT eval is needed please call Acute Rehab Dept. at [phone] or text page OT at [phone].    Benito Mccreedy  OTR/L [phone] 02/25/2014, 4:06 PM

## 2014-02-26 ENCOUNTER — Inpatient Hospital Stay (HOSPITAL_COMMUNITY): Payer: Medicare Other

## 2014-02-26 DIAGNOSIS — I714 Abdominal aortic aneurysm, without rupture, unspecified: Secondary | ICD-10-CM | POA: Insufficient documentation

## 2014-02-26 DIAGNOSIS — A498 Other bacterial infections of unspecified site: Secondary | ICD-10-CM | POA: Insufficient documentation

## 2014-02-26 DIAGNOSIS — R7881 Bacteremia: Secondary | ICD-10-CM | POA: Insufficient documentation

## 2014-02-26 DIAGNOSIS — J189 Pneumonia, unspecified organism: Secondary | ICD-10-CM | POA: Diagnosis present

## 2014-02-26 LAB — PREPARE RBC (CROSSMATCH)

## 2014-02-26 LAB — CBC
HEMATOCRIT: 21.8 % — AB (ref 39.0–52.0)
HEMOGLOBIN: 7.1 g/dL — AB (ref 13.0–17.0)
MCH: 25.8 pg — AB (ref 26.0–34.0)
MCHC: 32.6 g/dL (ref 30.0–36.0)
MCV: 79.3 fL (ref 78.0–100.0)
Platelets: 84 10*3/uL — ABNORMAL LOW (ref 150–400)
RBC: 2.75 MIL/uL — ABNORMAL LOW (ref 4.22–5.81)
RDW: 19.8 % — ABNORMAL HIGH (ref 11.5–15.5)
WBC: 4.8 10*3/uL (ref 4.0–10.5)

## 2014-02-26 LAB — CULTURE, BLOOD (ROUTINE X 2): Culture: NO GROWTH

## 2014-02-26 MED ORDER — SODIUM CHLORIDE 0.9 % IV SOLN
1.0000 g | Freq: Three times a day (TID) | INTRAVENOUS | Status: DC
  Administered 2014-02-26 – 2014-03-01 (×10): 1 g via INTRAVENOUS
  Filled 2014-02-26 (×12): qty 1

## 2014-02-26 MED ORDER — FUROSEMIDE 10 MG/ML IJ SOLN
20.0000 mg | Freq: Once | INTRAMUSCULAR | Status: AC
  Administered 2014-02-26: 20 mg via INTRAVENOUS
  Filled 2014-02-26: qty 2

## 2014-02-26 MED ORDER — IOHEXOL 350 MG/ML SOLN
100.0000 mL | Freq: Once | INTRAVENOUS | Status: AC | PRN
  Administered 2014-02-26: 100 mL via INTRAVENOUS

## 2014-02-26 MED ORDER — SODIUM CHLORIDE 0.9 % IV SOLN
Freq: Once | INTRAVENOUS | Status: AC
  Administered 2014-02-26: 10:00:00 via INTRAVENOUS

## 2014-02-26 MED ORDER — SODIUM CHLORIDE 0.9 % IR SOLN
3000.0000 mL | Status: DC
  Administered 2014-02-26: 3000 mL via INTRAVESICAL

## 2014-02-26 NOTE — Progress Notes (Signed)
SLP Cancellation Note  Patient Details Name: AZAAN LEASK MRN: [id number] DOB: 1930-02-07   Cancelled treatment:      Late entry:  Reason Eval/Treat Not Completed: Patient at procedure or test/unavailable.  Will f/u next date.    Juan Quam Laurice 02/26/2014, 4:09 PM

## 2014-02-26 NOTE — Clinical Social Work Note (Signed)
CSW notes that PMT meeting note states that the patient's family will be planning to take the patient home with services at discharge. Please reconsult social work if necessary. Voicemail left with RNCM to notify of need for [facility] services.   Liz Beach MSW, [place], [place], [phone]

## 2014-02-26 NOTE — Progress Notes (Signed)
Progress Note  [name] VVO:[id number] DOB: May 24, 1929 DOA: 02/21/2014 PCP: [name], MD  Brief narrative: 80 year old male patient with a very complex past medical history. He has a known left lower extremity DVT dating back to Feb 2015 but had to stop anticoagulation because of gross hematuria and subsequently an IVC filter was placed. In July 2015 (at [facility]) he underwent repair of a thoraco-abdominal aortic aneurysm but unfortunately this procedure was complicated by a Pseudomonas graft infection. He had a PICC line placed and received IV cefepime. He was dc'd to a nursing facility. He was dx'd with dysphagia and poor oral intake with severe DH during a hospitalization in early December of this year. A PEG tube was placed at that time.  With regard to the Pseudomonas graft infection with bacteremia, this was initially diagnosed in October 2015 and was pansensitive. He has been followed by infectious disease service from [facility] since that time. His PICC line was removed on 12/9 and antibiotics were changed to Levaquin per PEG.   He presented to [facility] on 12/10 with a temperature of 103.3. This was associated with hypotension. He was empirically started on vancomycin and cefepime. His chest x-ray was unremarkable. He had a mild leukopenia with a white cell count of 3400. Urinalysis was not consistent with UTI. Plans were to initially to send the patient to [facility] to resume treatment.  In the interim the family decided that they would like to be transferred to [facility] and request consultation with ID physicians there. He was transferred via ambulance to [facility] and upon arrival was hemodynamically stable and was not hypoxic.  HPI/Subjective: Patient awakens easily from sleep.  Has no acute complaints.  RN reports significant red clots in the foley 12/15.  Assessment/Plan:  Septic shock - resolved sepsis physiology has  resolved  History of Bacteremia due to Pseudomonas Infected aortic graft - 1/2 blood cx + Proteus Mirabilis ID following - cont meropenem and rifampin.  CTA evaluation of chest and abdomen does not reveal any evidence of abscess or infection involving the AAA graft.  Left upper lobe infiltrate.  HCAP Seen on CTA chest.   Speech Therapy on board for possible aspiration.  ID will guide antibiotic therapy.  Acute on chronic combined systolic (EF 26%) and grade 1 diastolic congestive heart failure Compensated   Hematuria in the setting of hx of prostate CA and radiation therapy. Appreciate urology consultation.  Foley placed due to acute urinary retention.  Hematuria is likely traumatic from foley.  Will continue with continuous bladder irrigation until clear.  If hematuria persists patient will need cysto.  Hold aspirin and plavix. Patient requiring transfusion of 1 unit of PRBCs.  Anemia with ABLA secondary to hematuria. Transfuse 1 unit of PRBCs 02/26/2014. Check CBC q 12 hours x 2.  HTN BP improved . Metoprolol resumed.   Thrombocytopenia chronic thrombocytopenia but presenting levels were lower than his baseline likely due to gram-negative infection - plt count is now improving   Stage III chronic kidney disease Baseline renal function BUN 26 creatinine 1.41 - creatinine now better than baseline s/p volume resuscitation - follow - GFR now normal   History of DVT of lower extremity (Feb 2015) - post IVC filter no lower extremity edema  Protein-calorie malnutrition, severe PEG tube feeds to continue - lytes stable   Dysphagia/Status post insertion of PEG tube started on tube feedings during last admission; continue Jevity - able to tolerate  oral intake for pleasure  Speech evaluation to define diet.   Palliative Medicine Appreciate Palliative Consultation.  Family wants full / aggressive care.    DVT prophylaxis: SCDs + prior IVC filter Code Status: Full Family Communication:   Disposition Plan/Expected LOS:  ID to advise on length of abx course and agent to be used .  Consultants: Infectious Disease Urology  Procedures: None Continuous bladder irrigation.  Antibiotics: Cefepime 12/10 > 12/11 Vancomycin 12/10 >12/13 Levaquin 12/10 Meropenem 12/11 > Rifampin 12/11 >  Objective: Blood pressure 100/59, pulse 85, temperature 98.3 F (36.8 C), temperature source Oral, resp. rate 17, height 6\' 2"  (1.88 m), weight 99.292 kg (218 lb 14.4 oz), SpO2 100 %.  Intake/Output Summary (Last 24 hours) at 02/26/14 1504 Last data filed at 02/26/14 1501  Gross per 24 hour  Intake 6263.5 ml  Output   6770 ml  Net -506.5 ml   Exam: Gen: Awakens easily.  Alert, appropriate.  Chronically ill appearing gentleman. Chest: Good air movement with no frank w/c/r Cardiac: regular rate - no appreciable gallup or rub - 3/6 systolic M Abdomen: Soft nontender nondistended without obvious hepatosplenomegaly, no ascites; PEG tube site unremarkable Extremities: no edema, 5/5 strength in each.    Scheduled Meds:  Scheduled Meds: . doxazosin  4 mg Per Tube QHS  . free water  100 mL Per Tube 3 times per day  . meropenem (MERREM) IV  1 g Intravenous 3 times per day  . methylphenidate  5 mg Per Tube Daily  . metoprolol tartrate  25 mg Per Tube BID  . mirtazapine  30 mg Per Tube QHS  . pantoprazole sodium  40 mg Per Tube Daily  . polyethylene glycol  17 g Per Tube Daily  . rifampin  300 mg Oral Q12H  . sodium chloride irrigation  3,000 mL Bladder Irrigation Q24H   Continuous Infusions: . sodium chloride 10 mL/hr at 02/24/14 1444  . feeding supplement (JEVITY 1.2 CAL) 1,000 mL (02/26/14 1224)    Data Reviewed: Basic Metabolic Panel:  Recent Labs Lab 02/21/14 1127 02/23/14 0049 02/24/14 0300 02/25/14 0434  NA 139 138 141 136*  K 4.3 3.9 3.8 4.1  CL 100 107 107 102  CO2 23 19 23 23   GLUCOSE 91 107* 109* 98  BUN 41* 31* 23 21  CREATININE 1.38* 1.10 0.86 0.88  CALCIUM  9.5 8.6 8.7 8.6  MG  --  1.8  --   --   PHOS  --  2.4  --   --    Liver Function Tests:  Recent Labs Lab 02/21/14 1127 02/23/14 0049 02/24/14 0300  AST 26 24 24   ALT 13 13 12   ALKPHOS 69 52 66  BILITOT 0.6 0.4 0.6  PROT 7.0 5.4* 5.5*  ALBUMIN 2.5* 1.7* 1.8*   CBC:  Recent Labs Lab 02/21/14 1127  02/23/14 0049 02/24/14 0300 02/25/14 0434 02/25/14 1147 02/25/14 1843 02/26/14 0541  WBC 3.4*  < > 2.7* 3.2* 4.5 3.9*  --  4.8  NEUTROABS 3.0  --   --   --   --   --   --   --   HGB 9.5*  < > 7.3* 7.5* 7.7* 7.8* 8.2* 7.1*  HCT 29.5*  < > 22.0* 23.0* 23.3* 23.2* 24.4* 21.8*  MCV 82.6  < > 82.4 81.0 80.9 81.1  --  79.3  PLT 103*  < > 67* 75* 87* 87*  --  84*  < > = values in this interval not  displayed.  Recent Results (from the past 240 hour(s))  Blood culture (routine x 2)     Status: None   Collection Time: 02/21/14 12:04 PM  Result Value Ref Range Status   Specimen Description BLOOD LEFT ANTECUBITAL  Final   Special Requests   Final    BOTTLES DRAWN AEROBIC AND ANAEROBIC 5CC  IMMUNE:COMPROMISED   Culture  Setup Time   Final    02/23/2014 00:32 Performed at [facility]    Culture   Final    PROTEUS MIRABILIS Note: Gram Stain Report Called to,Read Back By and Verified With: CARDONE T. AT 1400 ON 02/22/14 BY ISLEY B. Performed at [facility] Performed at [facility]    Report Status 02/25/2014 FINAL  Final   Organism ID, Bacteria PROTEUS MIRABILIS  Final      Susceptibility   Proteus mirabilis - MIC*    AMPICILLIN <=2 SENSITIVE Sensitive     AMPICILLIN/SULBACTAM <=2 SENSITIVE Sensitive     CEFAZOLIN <=4 SENSITIVE Sensitive     CEFEPIME <=1 SENSITIVE Sensitive     CEFTAZIDIME <=1 SENSITIVE Sensitive     CEFTRIAXONE <=1 SENSITIVE Sensitive     CIPROFLOXACIN >=4 RESISTANT Resistant     GENTAMICIN >=16 RESISTANT Resistant     IMIPENEM 1 SENSITIVE Sensitive     PIP/TAZO <=4 SENSITIVE Sensitive     TOBRAMYCIN 8 INTERMEDIATE Intermediate      TRIMETH/SULFA >=320 RESISTANT Resistant     * PROTEUS MIRABILIS  Blood culture (routine x 2)     Status: None   Collection Time: 02/21/14 12:18 PM  Result Value Ref Range Status   Specimen Description BLOOD LEFT HAND  Final   Special Requests   Final    BOTTLES DRAWN AEROBIC AND ANAEROBIC 5CC  IMMUNE:COMPROMISED   Culture NO GROWTH 5 DAYS  Final   Report Status 02/26/2014 FINAL  Final  MRSA PCR Screening     Status: None   Collection Time: 02/21/14  9:58 PM  Result Value Ref Range Status   MRSA by PCR NEGATIVE NEGATIVE Final    Comment:        The GeneXpert MRSA Assay (FDA approved for NASAL specimens only), is one component of a comprehensive MRSA colonization surveillance program. It is not intended to diagnose MRSA infection nor to guide or monitor treatment for MRSA infections.   Urine culture     Status: None   Collection Time: 02/22/14 10:29 AM  Result Value Ref Range Status   Specimen Description URINE, CATHETERIZED  Final   Special Requests Normal  Final   Culture  Setup Time   Final    02/22/2014 17:17 Performed at [place] Performed at [facility]   Final   Culture NO GROWTH Performed at [facility]   Final   Report Status 02/23/2014 FINAL  Final     Studies:  Recent x-ray studies have been reviewed in detail by the Attending Physician  Time spent :  35 mins   Imogene Burn, [place] [facility] Pager: [phone]   On-Call/Text Page:      [email or web address]      password [facility]  02/26/2014, 3:04 PM   LOS: 5 days   Patient seen and examined. No new complaints, hematuria persist.  Agree with blood transfusion. CTA no evidence of abscess. Appreciate ID help./   Clarke Amburn, Md.

## 2014-02-26 NOTE — Consult Note (Signed)
Urology Consult   Physician requesting consult: Regalado  Reason for consult: Hematuria  History of Present Illness: [name] is a 80 y.o. male with PMH significant for HTN, CaP s/p EBRT 2009, DVT, dysphagia s/p Peg tube, and an infected thoraco-abdominal aortic graft who was admitted on 02/21/14 for fevers and hypotension.  Pt is being evaled and treated by IM and ID and maintained on IV Abx.  After admission a 28f foley was placed and last night the pt began to have hematuria with retention.  The RN irrigated the catheter with a return of approx 800cc bloody urine and clots.  IM ordered the cath replaced today with a 33f 3 way and CBIs were started.  This is currently draining well.  UA and urine culture negative.   Pt states that prior to foley placement he was voiding without difficulty and did not have hematuria. He denies HA, CP, SOB, N/V, abdominal pain, flank pain, and dysuria.  He is currently resting comfortably and without complaint.    Per Dr. Ralene Muskrat last office eval 08/2013:  [name] returns today in f/u from his Left PCNL that was done in 2 stages and was complicated by a DVT.   He is doing well without hematuria but remains on xarelto.   He had [place] filter placed during his stay.   He is going to have his endovascular aneurysm repair at [facility].  It was delayed by his complications.  He had a gleason 7(4+3) prostate cancer treated with EXRT in 2009.  His last PSA was 1.36 in January which was a further increased with a PSADT of about 2-3 years.   He is voiding less now that the stone is out and he has less urgency.  He remains on detrol and doxazosin.    Past Medical History  Diagnosis Date  . Hypertension   . BPH (benign prostatic hyperplasia)   . Hyperlipidemia   . Cancer 2008-2009    prostate TREATED WITH RADIATION  . AAA (abdominal aortic aneurysm) 04/2012    STENTING OF AAA IN [place]  . Junctional cardiac arrhythmia     Occurred postoperatively after  urologic surgery  . Severe sinus bradycardia     Occurred postoperatively after urologic surgery  . Aortic valve disorders   . Other primary cardiomyopathies   . Dysphagia   . Status post insertion of percutaneous endoscopic gastrostomy (PEG) tube 02/11/14  . Bacteremia due to Pseudomonas   . Infected aortic graft     Past Surgical History  Procedure Laterality Date  . Abdominal surgery      PART OF COLON REMOVED FOR DIVERTICULITIS  . Appendectomy    . Hernia repair    . Bladder surgery  2008    FOR BLADDER STONE  . Total knee arthroplasty  1990    left  . Abdominal aortic aneurysm repair  04/2012  . Total knee arthroplasty Right 07/26/2012    Procedure: RIGHT TOTAL KNEE ARTHROPLASTY;  Surgeon: Tobi Bastos, MD;  Location: [facility];  Service: Orthopedics;  Laterality: Right;  . Nephrolithotomy Left 04/26/2013    Procedure: LEFT PERCUTANEOUS NEPHROLITHOTOMY ;  Surgeon: [name], MD;  Location: [facility];  Service: Urology;  Laterality: Left;  . Nephrolithotomy Left 05/03/2013    Procedure: 2ND STAGE LEFT PERCUTANEOUS NEPHROLITHOTOMY ;  Surgeon: [name], MD;  Location: [facility];  Service: Urology;  Laterality: Left;  . Esophagogastroduodenoscopy N/A 02/11/2014    Procedure: ESOPHAGOGASTRODUODENOSCOPY (EGD);  Surgeon: Daneil Dolin, MD;  Location: [facility];  Service: Endoscopy;  Laterality: N/A;  With PEG placement  . Peg placement N/A 02/11/2014    Procedure: PERCUTANEOUS ENDOSCOPIC GASTROSTOMY (PEG) PLACEMENT;  Surgeon: Daneil Dolin, MD;  Location: [facility];  Service: Endoscopy;  Laterality: N/A;    Current Hospital Medications:  Home Meds:    Medication List    ASK your doctor about these medications        acetaminophen 500 MG tablet  Commonly known as:  TYLENOL  Take 1,000 mg by mouth every 6 (six) hours as needed for mild pain or moderate pain.     aspirin 81 MG chewable tablet  Chew 81 mg by mouth daily.     clopidogrel 75 MG tablet  Commonly known as:   PLAVIX  Take 75 mg by mouth daily.     docusate sodium 100 MG capsule  Commonly known as:  COLACE  Take 100 mg by mouth daily as needed for mild constipation.     doxazosin 4 MG tablet  Commonly known as:  CARDURA  Take 4 mg by mouth at bedtime.     feeding supplement (JEVITY 1.2 CAL) Liqd  Place 1,000 mLs into feeding tube continuous.     feeding supplement (PRO-STAT SUGAR FREE 64) Liqd  Place 30 mLs into feeding tube 3 (three) times daily.     levofloxacin 750 MG tablet  Commonly known as:  LEVAQUIN  Take 750 mg by mouth daily. Continue indefintely for suppression of infection     LORazepam 0.5 MG tablet  Commonly known as:  ATIVAN  Take 1/2 tablet by mouth every 8 hours as needed for anxiety     methylphenidate 5 MG tablet  Commonly known as:  RITALIN  Take 5 mg by mouth daily.     metoprolol succinate 25 MG 24 hr tablet  Commonly known as:  TOPROL-XL  Take 25 mg by mouth daily.     mirtazapine 30 MG tablet  Commonly known as:  REMERON  Take 30 mg by mouth at bedtime.     omeprazole 40 MG capsule  Commonly known as:  PRILOSEC  Take 40 mg by mouth daily.     polyethylene glycol packet  Commonly known as:  MIRALAX / GLYCOLAX  Take 17 g by mouth daily as needed for mild constipation.     senna 8.6 MG Tabs tablet  Commonly known as:  SENOKOT  Take 2 tablets by mouth at bedtime.     simvastatin 20 MG tablet  Commonly known as:  ZOCOR  Take 20 mg by mouth daily.     traMADol 50 MG tablet  Commonly known as:  ULTRAM  Take 1 tablet (50 mg total) by mouth every 6 (six) hours as needed for moderate pain.     triamcinolone cream 0.1 %  Commonly known as:  KENALOG  Apply 1 application topically 2 (two) times daily as needed (rash).        Scheduled Meds: . doxazosin  4 mg Per Tube QHS  . free water  100 mL Per Tube 3 times per day  . furosemide  20 mg Intravenous Once  . meropenem (MERREM) IV  1 g Intravenous 3 times per day  . methylphenidate  5 mg Per  Tube Daily  . metoprolol tartrate  25 mg Per Tube BID  . mirtazapine  30 mg Per Tube QHS  . pantoprazole sodium  40 mg Per Tube Daily  . polyethylene glycol  17 g Per  Tube Daily  . rifampin  300 mg Oral Q12H  . sodium chloride irrigation  3,000 mL Bladder Irrigation Q24H   Continuous Infusions: . sodium chloride 10 mL/hr at 02/24/14 1444  . feeding supplement (JEVITY 1.2 CAL) 1,000 mL (02/26/14 1224)   PRN Meds:.acetaminophen (TYLENOL) oral liquid 160 mg/5 mL, albuterol, LORazepam, morphine injection, ondansetron **OR** ondansetron (ZOFRAN) IV  Allergies:  Allergies  Allergen Reactions  . Atorvastatin Other (See Comments)    Causes constipation  . Penicillins Other (See Comments)    Other reaction(s): RASH HIVES     Family History  Problem Relation Age of Onset  . Lung cancer Father     Social History:  reports that he quit smoking about 28 years ago. His smoking use included Cigarettes. He started smoking about 65 years ago. He has a 74 pack-year smoking history. He has never used smokeless tobacco. He reports that he does not drink alcohol or use illicit drugs.  ROS: A complete review of systems was performed.  All systems are negative except for pertinent findings as noted.  Physical Exam:  Vital signs in last 24 hours: Temp:  [97.7 F (36.5 C)-99.4 F (37.4 C)] 98.3 F (36.8 C) (12/15 1230) Pulse Rate:  [66-85] 85 (12/15 0655) Resp:  [17-24] 17 (12/15 1230) BP: (100-132)/(59-72) 100/59 mmHg (12/15 1230) SpO2:  [95 %-100 %] 100 % (12/15 1230) Constitutional:  Alert and oriented, No acute distress Cardiovascular: Regular rate and rhythm Respiratory: Normal respiratory effort GI: Abdomen is soft, nontender, nondistended, no abdominal masses GU: foley in place with irrigation running; cherry red urine/irrigation in foley bag; circum penis with small amount of blood at meatus; no skin breakdown Lymphatic: No lymphadenopathy Neurologic: Grossly intact, no focal  deficits Psychiatric: Normal mood and affect  Laboratory Data:   Recent Labs  02/24/14 0300 02/25/14 0434 02/25/14 1147 02/25/14 1843 02/26/14 0541  WBC 3.2* 4.5 3.9*  --  4.8  HGB 7.5* 7.7* 7.8* 8.2* 7.1*  HCT 23.0* 23.3* 23.2* 24.4* 21.8*  PLT 75* 87* 87*  --  84*     Recent Labs  02/24/14 0300 02/25/14 0434  NA 141 136*  K 3.8 4.1  CL 107 102  GLUCOSE 109* 98  BUN 23 21  CALCIUM 8.7 8.6  CREATININE 0.86 0.88     Results for orders placed or performed during the hospital encounter of 02/21/14 (from the past 24 hour(s))  Hemoglobin and hematocrit, blood     Status: Abnormal   Collection Time: 02/25/14  6:43 PM  Result Value Ref Range   Hemoglobin 8.2 (L) 13.0 - 17.0 g/dL   HCT 24.4 (L) 39.0 - 52.0 %  CBC     Status: Abnormal   Collection Time: 02/26/14  5:41 AM  Result Value Ref Range   WBC 4.8 4.0 - 10.5 K/uL   RBC 2.75 (L) 4.22 - 5.81 MIL/uL   Hemoglobin 7.1 (L) 13.0 - 17.0 g/dL   HCT 21.8 (L) 39.0 - 52.0 %   MCV 79.3 78.0 - 100.0 fL   MCH 25.8 (L) 26.0 - 34.0 pg   MCHC 32.6 30.0 - 36.0 g/dL   RDW 19.8 (H) 11.5 - 15.5 %   Platelets 84 (L) 150 - 400 K/uL  Type and screen     Status: None (Preliminary result)   Collection Time: 02/26/14  9:09 AM  Result Value Ref Range   ABO/RH(D) O POS    Antibody Screen NEG    Sample Expiration 03/01/2014    Unit  Number [id number]    Blood Component Type RED CELLS,LR    Unit division 00    Status of Unit ISSUED    Transfusion Status OK TO TRANSFUSE    Crossmatch Result Compatible   Prepare RBC     Status: None   Collection Time: 02/26/14  9:09 AM  Result Value Ref Range   Order Confirmation ORDER PROCESSED BY BLOOD BANK    Recent Results (from the past 240 hour(s))  Blood culture (routine x 2)     Status: None   Collection Time: 02/21/14 12:04 PM  Result Value Ref Range Status   Specimen Description BLOOD LEFT ANTECUBITAL  Final   Special Requests   Final    BOTTLES DRAWN AEROBIC AND ANAEROBIC 5CC   IMMUNE:COMPROMISED   Culture  Setup Time   Final    02/23/2014 00:32 Performed at [facility]    Culture   Final    PROTEUS MIRABILIS Note: Gram Stain Report Called to,Read Back By and Verified With: CARDONE T. AT 1400 ON 02/22/14 BY ISLEY B. Performed at [facility] Performed at [facility]    Report Status 02/25/2014 FINAL  Final   Organism ID, Bacteria PROTEUS MIRABILIS  Final      Susceptibility   Proteus mirabilis - MIC*    AMPICILLIN <=2 SENSITIVE Sensitive     AMPICILLIN/SULBACTAM <=2 SENSITIVE Sensitive     CEFAZOLIN <=4 SENSITIVE Sensitive     CEFEPIME <=1 SENSITIVE Sensitive     CEFTAZIDIME <=1 SENSITIVE Sensitive     CEFTRIAXONE <=1 SENSITIVE Sensitive     CIPROFLOXACIN >=4 RESISTANT Resistant     GENTAMICIN >=16 RESISTANT Resistant     IMIPENEM 1 SENSITIVE Sensitive     PIP/TAZO <=4 SENSITIVE Sensitive     TOBRAMYCIN 8 INTERMEDIATE Intermediate     TRIMETH/SULFA >=320 RESISTANT Resistant     * PROTEUS MIRABILIS  Blood culture (routine x 2)     Status: None   Collection Time: 02/21/14 12:18 PM  Result Value Ref Range Status   Specimen Description BLOOD LEFT HAND  Final   Special Requests   Final    BOTTLES DRAWN AEROBIC AND ANAEROBIC 5CC  IMMUNE:COMPROMISED   Culture NO GROWTH 5 DAYS  Final   Report Status 02/26/2014 FINAL  Final  MRSA PCR Screening     Status: None   Collection Time: 02/21/14  9:58 PM  Result Value Ref Range Status   MRSA by PCR NEGATIVE NEGATIVE Final    Comment:        The GeneXpert MRSA Assay (FDA approved for NASAL specimens only), is one component of a comprehensive MRSA colonization surveillance program. It is not intended to diagnose MRSA infection nor to guide or monitor treatment for MRSA infections.   Urine culture     Status: None   Collection Time: 02/22/14 10:29 AM  Result Value Ref Range Status   Specimen Description URINE, CATHETERIZED  Final   Special Requests Normal  Final   Culture  Setup  Time   Final    02/22/2014 17:17 Performed at [place] Performed at [facility]   Final   Culture NO GROWTH Performed at [facility]   Final   Report Status 02/23/2014 FINAL  Final    Renal Function:  Recent Labs  02/21/14 1127 02/23/14 0049 02/24/14 0300 02/25/14 0434  CREATININE 1.38* 1.10 0.86 0.88   Estimated Creatinine Clearance: 81.5 mL/min (by C-G  formula based on Cr of 0.88).  Radiologic Imaging: No results found.  Impression/Recommendation  Hematuria with clot retention--likely due to foley trauma in pt with hx of radiation to prostate.  Continue foley with CBIs until urine clear then d/c CBIs.  Maintain foley without irrigation to ensure no additional clot retention then remove foley for void trial.  If hematuria with clots persists pt will need cysto.   [place], [place] 02/26/2014, 12:59 PM

## 2014-02-26 NOTE — Plan of Care (Signed)
Problem: Phase I Progression Outcomes Goal: Voiding-avoid urinary catheter unless indicated Outcome: Not Progressing Pt having hematuria and will be started on continuous bladder irrigation

## 2014-02-26 NOTE — Progress Notes (Signed)
Pt. With increasing pain to bladder and leaking around 16 Fr. Foley site with blood noted. Foley advanced and irrigated.  895ml dark red bloody urine immediately drained to foley with clots noted.  Pt. States pain to bladder decreased.  Tylene Fantasia NP notified.  Will continue to monitor.  Leg strap changed to stat-lock for better stability to foley placement.

## 2014-02-26 NOTE — Progress Notes (Signed)
ANTIBIOTIC CONSULT NOTE   Pharmacy Consult for Meropenem Indication: Proteus mirabilis bacteremia  Allergies  Allergen Reactions  . Atorvastatin Other (See Comments)    Causes constipation  . Penicillins Other (See Comments)    Other reaction(s): RASH HIVES     Labs:  Recent Labs  02/24/14 0300 02/25/14 0434 02/25/14 1147 02/25/14 1843 02/26/14 0541  WBC 3.2* 4.5 3.9*  --  4.8  HGB 7.5* 7.7* 7.8* 8.2* 7.1*  PLT 75* 87* 87*  --  84*  CREATININE 0.86 0.88  --   --   --    Estimated Creatinine Clearance: 81.5 mL/min (by C-G formula based on Cr of 0.88). No results for input(s): VANCOTROUGH, VANCOPEAK, VANCORANDOM, GENTTROUGH, GENTPEAK, GENTRANDOM, TOBRATROUGH, TOBRAPEAK, TOBRARND, AMIKACINPEAK, AMIKACINTROU, AMIKACIN in the last 72 hours.   Microbiology: Recent Results (from the past 720 hour(s))  Urine culture     Status: None   Collection Time: 02/06/14  2:55 PM  Result Value Ref Range Status   Specimen Description URINE, CATHETERIZED  Final   Special Requests NONE  Final   Culture  Setup Time   Final    02/06/2014 22:59 Performed at [place] Performed at [facility]   Final   Culture NO GROWTH Performed at [facility]   Final   Report Status 02/08/2014 FINAL  Final  Blood culture (routine x 2)     Status: None   Collection Time: 02/21/14 12:04 PM  Result Value Ref Range Status   Specimen Description BLOOD LEFT ANTECUBITAL  Final   Special Requests   Final    BOTTLES DRAWN AEROBIC AND ANAEROBIC 5CC  IMMUNE:COMPROMISED   Culture  Setup Time   Final    02/23/2014 00:32 Performed at [facility]    Culture   Final    PROTEUS MIRABILIS Note: Gram Stain Report Called to,Read Back By and Verified With: CARDONE T. AT 1400 ON 02/22/14 BY ISLEY B. Performed at [facility] Performed at [facility]    Report Status 02/25/2014 FINAL  Final   Organism ID, Bacteria PROTEUS MIRABILIS   Final      Susceptibility   Proteus mirabilis - MIC*    AMPICILLIN <=2 SENSITIVE Sensitive     AMPICILLIN/SULBACTAM <=2 SENSITIVE Sensitive     CEFAZOLIN <=4 SENSITIVE Sensitive     CEFEPIME <=1 SENSITIVE Sensitive     CEFTAZIDIME <=1 SENSITIVE Sensitive     CEFTRIAXONE <=1 SENSITIVE Sensitive     CIPROFLOXACIN >=4 RESISTANT Resistant     GENTAMICIN >=16 RESISTANT Resistant     IMIPENEM 1 SENSITIVE Sensitive     PIP/TAZO <=4 SENSITIVE Sensitive     TOBRAMYCIN 8 INTERMEDIATE Intermediate     TRIMETH/SULFA >=320 RESISTANT Resistant     * PROTEUS MIRABILIS  Blood culture (routine x 2)     Status: None (Preliminary result)   Collection Time: 02/21/14 12:18 PM  Result Value Ref Range Status   Specimen Description BLOOD LEFT HAND  Final   Special Requests   Final    BOTTLES DRAWN AEROBIC AND ANAEROBIC 5CC  IMMUNE:COMPROMISED   Culture NO GROWTH 4 DAYS  Final   Report Status PENDING  Incomplete  MRSA PCR Screening     Status: None   Collection Time: 02/21/14  9:58 PM  Result Value Ref Range Status   MRSA by PCR NEGATIVE NEGATIVE Final    Comment:        The GeneXpert MRSA  Assay (FDA approved for NASAL specimens only), is one component of a comprehensive MRSA colonization surveillance program. It is not intended to diagnose MRSA infection nor to guide or monitor treatment for MRSA infections.   Urine culture     Status: None   Collection Time: 02/22/14 10:29 AM  Result Value Ref Range Status   Specimen Description URINE, CATHETERIZED  Final   Special Requests Normal  Final   Culture  Setup Time   Final    02/22/2014 17:17 Performed at [place] Performed at [facility]   Final   Culture NO GROWTH Performed at [facility]   Final   Report Status 02/23/2014 FINAL  Final     Assessment Septic shock secondary to recurrent Pseudomonas bacteremia from infected aortic graft.The patient is status post thoracoabdominal  aortic aneurysm repair in July 2015. This was complicated by a Pseudomonas graft infection.  Vancomycin has been stopped.  Meropenem and rifampin continue  BC x 1 now with Proteus mirabilis, sensitive to ampicillin, cephalosporins, imipenem, and Zosyn Afebrile, Scr stable, and WBC WNL   Plan:  Increase meropenem dose to 1g IV q8h  Thank you. Heide Guile, PharmD, BCPS Clinical Pharmacist Pager [phone]   02/26/2014,9:59 AM

## 2014-02-26 NOTE — Progress Notes (Signed)
INFECTIOUS DISEASE PROGRESS NOTE  ID: [name] is a 80 y.o. 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 1 diastolic congestive heart failure   CKD (chronic kidney disease), stage III   Palliative care encounter   HCAP (healthcare-associated pneumonia)   AAA (abdominal aortic aneurysm)  Subjective: Having loose BM  Abtx:  Anti-infectives    Start     Dose/Rate Route Frequency Ordered Stop   02/26/14 1400  meropenem (MERREM) 1 g in sodium chloride 0.9 % 100 mL IVPB     1 g200 mL/hr over 30 Minutes Intravenous 3 times per day 02/26/14 0951     02/22/14 1200  rifampin (RIFADIN) capsule 300 mg     300 mg Oral Every 12 hours 02/22/14 1108     02/22/14 1130  meropenem (MERREM) 1 g in sodium chloride 0.9 % 100 mL IVPB  Status:  Discontinued     1 g200 mL/hr over 30 Minutes Intravenous Every 12 hours 02/22/14 1119 02/26/14 0951   02/22/14 1000  ceFEPIme (MAXIPIME) 2 g in dextrose 5 % 50 mL IVPB  Status:  Discontinued     2 g100 mL/hr over 30 Minutes Intravenous Every 12 hours 02/22/14 0833 02/22/14 1108   02/22/14 0500  vancomycin (VANCOCIN) IVPB 750 mg/150 ml premix  Status:  Discontinued     750 mg150 mL/hr over 60 Minutes Intravenous Every 12 hours 02/21/14 1622 02/24/14 1552   02/21/14 1800  ceFEPIme (MAXIPIME) 2 g in dextrose 5 % 50 mL IVPB  Status:  Discontinued     2 g100 mL/hr over 30 Minutes Intravenous Every 8 hours 02/21/14 1622 02/22/14 0833   02/21/14 1700  vancomycin (VANCOCIN) IVPB 1000 mg/200 mL premix  Status:  Discontinued     1,000  mg200 mL/hr over 60 Minutes Intravenous  Once 02/21/14 1622 02/21/14 1707   02/21/14 1315  levofloxacin (LEVAQUIN) IVPB 750 mg     750 mg100 mL/hr over 90 Minutes Intravenous  Once 02/21/14 1310 02/21/14 1525      Medications:  Scheduled: . doxazosin  4 mg Per Tube QHS  . free water  100 mL Per Tube 3 times per day  . meropenem (MERREM) IV  1 g Intravenous 3 times per day  . methylphenidate  5 mg Per Tube Daily  . metoprolol tartrate  25 mg Per Tube BID  . mirtazapine  30 mg Per Tube QHS  . pantoprazole sodium  40 mg Per Tube Daily  . polyethylene glycol  17 g Per Tube Daily  . rifampin  300 mg Oral Q12H  . sodium chloride irrigation  3,000 mL Bladder Irrigation Q24H    Objective: Vital signs in last 24 hours: Temp:  [97.7 F (36.5 C)-99.4 F (37.4 C)] 97.8 F (36.6 C) (12/15 1505) Pulse Rate:  [67-85] 67 (12/15 1505) Resp:  [17-24] 18 (12/15 1505) BP: (100-132)/(59-72) 129/67 mmHg (12/15 1505) SpO2:  [95 %-100 %] 100 % (12/15 1505)   General appearance: alert and mild distress Resp: clear to auscultation bilaterally Cardio: regular rate and rhythm  GI: normal findings: bowel sounds normal and soft, non-tender and abnormal findings:  distended  Lab Results  Recent Labs  02/24/14 0300 02/25/14 0434 02/25/14 1147 02/25/14 1843 02/26/14 0541  WBC 3.2* 4.5 3.9*  --  4.8  HGB 7.5* 7.7* 7.8* 8.2* 7.1*  HCT 23.0* 23.3* 23.2* 24.4* 21.8*  NA 141 136*  --   --   --   K 3.8 4.1  --   --   --   CL 107 102  --   --   --   CO2 23 23  --   --   --   BUN 23 21  --   --   --   CREATININE 0.86 0.88  --   --   --    Liver Panel  Recent Labs  02/24/14 0300  PROT 5.5*  ALBUMIN 1.8*  AST 24  ALT 12  ALKPHOS 66  BILITOT 0.6   Sedimentation Rate No results for input(s): ESRSEDRATE in the last 72 hours. C-Reactive Protein No results for input(s): CRP in the last 72 hours.  Microbiology: Recent Results (from the past 240 hour(s))  Blood culture (routine x 2)      Status: None   Collection Time: 02/21/14 12:04 PM  Result Value Ref Range Status   Specimen Description BLOOD LEFT ANTECUBITAL  Final   Special Requests   Final    BOTTLES DRAWN AEROBIC AND ANAEROBIC 5CC  IMMUNE:COMPROMISED   Culture  Setup Time   Final    02/23/2014 00:32 Performed at [facility]    Culture   Final    PROTEUS MIRABILIS Note: Gram Stain Report Called to,Read Back By and Verified With: CARDONE T. AT 1400 ON 02/22/14 BY ISLEY B. Performed at [facility] Performed at [facility]    Report Status 02/25/2014 FINAL  Final   Organism ID, Bacteria PROTEUS MIRABILIS  Final      Susceptibility   Proteus mirabilis - MIC*    AMPICILLIN <=2 SENSITIVE Sensitive     AMPICILLIN/SULBACTAM <=2 SENSITIVE Sensitive     CEFAZOLIN <=4 SENSITIVE Sensitive     CEFEPIME <=1 SENSITIVE Sensitive     CEFTAZIDIME <=1 SENSITIVE Sensitive     CEFTRIAXONE <=1 SENSITIVE Sensitive     CIPROFLOXACIN >=4 RESISTANT Resistant     GENTAMICIN >=16 RESISTANT Resistant     IMIPENEM 1 SENSITIVE Sensitive     PIP/TAZO <=4 SENSITIVE Sensitive     TOBRAMYCIN 8 INTERMEDIATE Intermediate     TRIMETH/SULFA >=320 RESISTANT Resistant     * PROTEUS MIRABILIS  Blood culture (routine x 2)     Status: None   Collection Time: 02/21/14 12:18 PM  Result Value Ref Range Status   Specimen Description BLOOD LEFT HAND  Final   Special Requests   Final    BOTTLES DRAWN AEROBIC AND ANAEROBIC 5CC  IMMUNE:COMPROMISED   Culture NO GROWTH 5 DAYS  Final   Report Status 02/26/2014 FINAL  Final  MRSA PCR Screening     Status: None   Collection Time: 02/21/14  9:58 PM  Result Value Ref Range Status   MRSA by PCR NEGATIVE NEGATIVE Final    Comment:        The GeneXpert MRSA Assay (FDA approved for NASAL specimens only), is one component of a comprehensive MRSA colonization surveillance program. It is not intended to diagnose MRSA infection nor to guide or monitor treatment for MRSA  infections.   Urine culture     Status:  None   Collection Time: 02/22/14 10:29 AM  Result Value Ref Range Status   Specimen Description URINE, CATHETERIZED  Final   Special Requests Normal  Final   Culture  Setup Time   Final    02/22/2014 17:17 Performed at [place] NO GROWTH Performed at [facility]   Final   Culture NO GROWTH Performed at [facility]   Final   Report Status 02/23/2014 FINAL  Final    Studies/Results: Ct Angio Abdomen W/cm &/or Wo Contrast  02/26/2014   CLINICAL DATA:  Thoracoabdominal aortic aneurysm post stenting, celiac axis pseudoaneurysm, graft became infected, septic shock due to recurrent graft infection (Pseudomonas  EXAM: CT ANGIOGRAPHY CHEST, ABDOMEN AND PELVIS  TECHNIQUE: Multidetector CT imaging through the chest, abdomen and pelvis was performed using the standard protocol during bolus administration of intravenous contrast. Multiplanar reconstructed images and MIPs were obtained and reviewed to evaluate the vascular anatomy.  CONTRAST:  121mL OMNIPAQUE IOHEXOL 350 MG/ML SOLN  COMPARISON:  CT abdomen and pelvis 01/11/2014  FINDINGS: CTA CHEST FINDINGS  Scattered atherosclerotic calcifications aorta, visualized great vessels and coronary arteries.  Aortic endoluminal stent from proximal descending thoracic aorta through aortic bifurcation.  Following contrast, normal enhancement of the aorta is identified.  No evidence of thoracic aneurysm or dissection.  Pulmonary arteries patent.  No thoracic adenopathy.  Scattered atelectasis in both lower lobes with infiltrate seen in the LEFT upper lobe.  Remaining RIGHT lung clear.  No pleural effusion or pneumothorax.  Review of the MIP images confirms the above findings.  CTA ABDOMEN AND PELVIS FINDINGS  Aortic stent extends from proximal descending thoracic aorta through abdominal aorta into BILATERAL common iliac arteries and into the RIGHT internal and external iliac  arteries.  Aneurysmal dilatation of the RIGHT common iliac artery again identified 4.3 cm transverse.  Small distal abdominal aortic aneurysm again identified, 4.4 x 3.9 cm.  Proximal abdominal aortic aneurysm at aortic hiatus, 6.3 x 6.7 cm unchanged.  Stents identified at the renal arteries bilaterally, SMA and celiac artery.  Following contrast, no evidence of endoleak identified.  Interval decrease in size of pseudoaneurysm at the celiac axis with pseudoaneurysm no longer opacified by contrast.  Mild hazy infiltration of the fat surrounding the celiac access and pseudoaneurysm is unchanged.  Small amount of thrombus identified within proximal RIGHT common iliac artery stent, stable.  No infiltration of fat surrounding the aortic or common iliac artery aneurysms identified to suggest leak/rupture.  No abnormal fluid collections are seen to suggest abscess though infection of graft cannot be excluded by CT.  Large cyst inferior pole LEFT kidney 4.9 x 4.1 cm image 135.  Small amount of fluid adjacent to gallbladder, nonspecific cannot exclude cholecystitis with this appearance.  IVC filter noted.  PEG tube in stomach.  Descending and sigmoid colonic diverticulosis without evidence of diverticulitis.  Foley catheter and small amount of air within urinary bladder with question minimal bladder wall thickening anteriorly and intraluminal high attenuation material likely blood or debris.  Minimal prostate enlargement.  Remaining bowel loops unremarkable.  No additional mass, adenopathy, free fluid or free air.  Bones demineralized.  Review of the MIP images confirms the above findings.  IMPRESSION: Stable sizes of thoracoabdominal, distal aortic and RIGHT common iliac artery aneurysms post endoluminal stenting.  No evidence of endoleak or adjacent abscess collection.  Interval additional stenting of the celiac artery with previously identified pseudoaneurysm no longer opacified and smaller in size.  Dissection seen at  the common hepatic artery origin on the previous study is not definitely visualized on the current study which demonstrates an interval stent.  Suspected blood or debris within urinary bladder, cannot exclude minimal anterior bladder wall thickening.  Colonic diverticulosis.  Patchy bibasilar atelectasis with mild LEFT upper lobe infiltrate suspicious for pneumonia.  Small amount of fluid adjacent to the gallbladder, cannot exclude cholecystitis; recommend clinical correlation and if indicated ultrasound imaging.   Electronically Signed   By: Lavonia Dana M.D.   On: 02/26/2014 13:38   Ct Angio Chest Aortic Dissect W &/or W/o  02/26/2014   CLINICAL DATA:  Thoracoabdominal aortic aneurysm post stenting, celiac axis pseudoaneurysm, graft became infected, septic shock due to recurrent graft infection (Pseudomonas  EXAM: CT ANGIOGRAPHY CHEST, ABDOMEN AND PELVIS  TECHNIQUE: Multidetector CT imaging through the chest, abdomen and pelvis was performed using the standard protocol during bolus administration of intravenous contrast. Multiplanar reconstructed images and MIPs were obtained and reviewed to evaluate the vascular anatomy.  CONTRAST:  133mL OMNIPAQUE IOHEXOL 350 MG/ML SOLN  COMPARISON:  CT abdomen and pelvis 01/11/2014  FINDINGS: CTA CHEST FINDINGS  Scattered atherosclerotic calcifications aorta, visualized great vessels and coronary arteries.  Aortic endoluminal stent from proximal descending thoracic aorta through aortic bifurcation.  Following contrast, normal enhancement of the aorta is identified.  No evidence of thoracic aneurysm or dissection.  Pulmonary arteries patent.  No thoracic adenopathy.  Scattered atelectasis in both lower lobes with infiltrate seen in the LEFT upper lobe.  Remaining RIGHT lung clear.  No pleural effusion or pneumothorax.  Review of the MIP images confirms the above findings.  CTA ABDOMEN AND PELVIS FINDINGS  Aortic stent extends from proximal descending thoracic aorta through  abdominal aorta into BILATERAL common iliac arteries and into the RIGHT internal and external iliac arteries.  Aneurysmal dilatation of the RIGHT common iliac artery again identified 4.3 cm transverse.  Small distal abdominal aortic aneurysm again identified, 4.4 x 3.9 cm.  Proximal abdominal aortic aneurysm at aortic hiatus, 6.3 x 6.7 cm unchanged.  Stents identified at the renal arteries bilaterally, SMA and celiac artery.  Following contrast, no evidence of endoleak identified.  Interval decrease in size of pseudoaneurysm at the celiac axis with pseudoaneurysm no longer opacified by contrast.  Mild hazy infiltration of the fat surrounding the celiac access and pseudoaneurysm is unchanged.  Small amount of thrombus identified within proximal RIGHT common iliac artery stent, stable.  No infiltration of fat surrounding the aortic or common iliac artery aneurysms identified to suggest leak/rupture.  No abnormal fluid collections are seen to suggest abscess though infection of graft cannot be excluded by CT.  Large cyst inferior pole LEFT kidney 4.9 x 4.1 cm image 135.  Small amount of fluid adjacent to gallbladder, nonspecific cannot exclude cholecystitis with this appearance.  IVC filter noted.  PEG tube in stomach.  Descending and sigmoid colonic diverticulosis without evidence of diverticulitis.  Foley catheter and small amount of air within urinary bladder with question minimal bladder wall thickening anteriorly and intraluminal high attenuation material likely blood or debris.  Minimal prostate enlargement.  Remaining bowel loops unremarkable.  No additional mass, adenopathy, free fluid or free air.  Bones demineralized.  Review of the MIP images confirms the above findings.  IMPRESSION: Stable sizes of thoracoabdominal, distal aortic and RIGHT common iliac artery aneurysms post endoluminal stenting.  No evidence of endoleak or adjacent abscess collection.  Interval additional stenting of the celiac artery with  previously identified  pseudoaneurysm no longer opacified and smaller in size.  Dissection seen at the common hepatic artery origin on the previous study is not definitely visualized on the current study which demonstrates an interval stent.  Suspected blood or debris within urinary bladder, cannot exclude minimal anterior bladder wall thickening.  Colonic diverticulosis.  Patchy bibasilar atelectasis with mild LEFT upper lobe infiltrate suspicious for pneumonia.  Small amount of fluid adjacent to the gallbladder, cannot exclude cholecystitis; recommend clinical correlation and if indicated ultrasound imaging.   Electronically Signed   By: Lavonia Dana M.D.   On: 02/26/2014 13:38     Assessment/Plan: Vascular Graft Infection Previous pseudomonas Current Proteus bacteremia Protein-calorie malnutrition, severe  Total days of antibiotics: 5 rifampin/merrem  Await C diff His CT scan does not show a source for his proteus bacteremia. I would treat him for 2 weeks then return him to his previous regimen for his pseudomonas graft infection. He should f/u with his [facility] MDs regarding his graft infection.  Dr Linus Salmons is available if questions after 12-16.           Bobby Rumpf Infectious Diseases (pager) [phone] [email or web address] 02/26/2014, 4:49 PM  LOS: 5 days

## 2014-02-26 NOTE — Progress Notes (Signed)
Patient [name]      DOB: Nov 11, 1929      VOZ:[id number]   Palliative Medicine Team at [facility] Progress Note    Subjective: Hearing aids fixed.  Not having pain, N/V.  No appetite really.  Most important to him is being home by christmas.      Filed Vitals:   02/26/14 1230  BP: 100/59  Pulse:   Temp: 98.3 F (36.8 C)  Resp: 17   Physical exam: GEN: alert, NAD HEENT: [place], sclera anicteric CV: regular rate LUNGS: CTAB ABD: soft, NT, +PEG  CBC    Component Value Date/Time   WBC 4.8 02/26/2014 0541   WBC 5.8 12/26/2013 1105   WBC 5.4 02/28/2007 0919   RBC 2.75* 02/26/2014 0541   RBC 3.62* 02/21/2014 1127   RBC 4.10* 12/26/2013 1105   RBC 5.21 02/28/2007 0919   HGB 7.1* 02/26/2014 0541   HGB 11.0* 12/26/2013 1105   HGB 15.0 02/28/2007 0919   HCT 21.8* 02/26/2014 0541   HCT 34.5* 12/26/2013 1105   HCT 44.0 02/28/2007 0919   PLT 84* 02/26/2014 0541   PLT 157 12/26/2013 1105   PLT 153 02/28/2007 0919   MCV 79.3 02/26/2014 0541   MCV 84 12/26/2013 1105   MCV 84.4 02/28/2007 0919   MCH 25.8* 02/26/2014 0541   MCH 26.8* 12/26/2013 1105   MCH 28.7 02/28/2007 0919   MCHC 32.6 02/26/2014 0541   MCHC 31.9* 12/26/2013 1105   MCHC 34.0 02/28/2007 0919   RDW 19.8* 02/26/2014 0541   RDW 17.5* 12/26/2013 1105   RDW 14.9* 02/28/2007 0919   LYMPHSABS 0.3* 02/21/2014 1127   LYMPHSABS 0.9 12/26/2013 1105   LYMPHSABS 1.6 02/28/2007 0919   MONOABS 0.1 02/21/2014 1127   MONOABS 0.6 02/28/2007 0919   EOSABS 0.0 02/21/2014 1127   EOSABS 0.1 12/26/2013 1105   EOSABS 0.3 02/28/2007 0919   BASOSABS 0.0 02/21/2014 1127   BASOSABS 0.0 12/26/2013 1105   BASOSABS 0.0 02/28/2007 0919    CMP     Component Value Date/Time   NA 136* 02/25/2014 0434   NA 140 10/31/2013 0839   K 4.1 02/25/2014 0434   K 3.9 10/31/2013 0839   CL 102 02/25/2014 0434   CL 103 10/31/2013 0839   CO2 23 02/25/2014 0434   CO2 27 10/31/2013 0839   GLUCOSE 98 02/25/2014 0434   GLUCOSE 93  10/31/2013 0839   BUN 21 02/25/2014 0434   BUN 17 10/31/2013 0839   CREATININE 0.88 02/25/2014 0434   CREATININE 1.1 10/31/2013 0839   CALCIUM 8.6 02/25/2014 0434   CALCIUM 8.9 10/31/2013 0839   PROT 5.5* 02/24/2014 0300   ALBUMIN 1.8* 02/24/2014 0300   AST 24 02/24/2014 0300   ALT 12 02/24/2014 0300   ALKPHOS 66 02/24/2014 0300   BILITOT 0.6 02/24/2014 0300   GFRNONAA 78* 02/25/2014 0434   GFRAA >90 02/25/2014 0434      Assessment and plan: 80 yo male with 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   1. Code Status: Full  2. GOC: See previous documentation. Updated daughters via phone today.  Goal remains to get him home by christmas. Want to continue all restorative care they can.  Do not want SNF as they have not had good experience and family feels he would do better who home PT and family assistance.  We have discussed concerns about long-term complications.  Awaiting results of CT today. Transfusion today with [facility] for ongoing hematuria.  Perhaps they can be setup with [facility] palliative care upon discharge?  Our team can try to meet again with family when closer to discharge.    3. Symptom Management:  Weight Loss/FTT- PEG placed 11/30.Ongoing challenges as above. Speech therapy to eval his ability to take oral safely. On dysphagia diet.  Appetite poor which I am sure is also related to continued tube feeding.    4. Psychosocial/Spiritual: From [place]. Has 3 daughters and 1 son. Has been hospitalized or in nursing facility for past several months.   Doran Clay D.O. Palliative Medicine Team at [facility]  Pager: [phone] Team Phone: [phone]

## 2014-02-27 ENCOUNTER — Inpatient Hospital Stay (HOSPITAL_COMMUNITY): Payer: Medicare Other

## 2014-02-27 DIAGNOSIS — A047 Enterocolitis due to Clostridium difficile: Secondary | ICD-10-CM

## 2014-02-27 DIAGNOSIS — A0472 Enterocolitis due to Clostridium difficile, not specified as recurrent: Secondary | ICD-10-CM

## 2014-02-27 LAB — BASIC METABOLIC PANEL
Anion gap: 13 (ref 5–15)
BUN: 24 mg/dL — AB (ref 6–23)
CALCIUM: 8.8 mg/dL (ref 8.4–10.5)
CO2: 23 meq/L (ref 19–32)
CREATININE: 0.84 mg/dL (ref 0.50–1.35)
Chloride: 103 mEq/L (ref 96–112)
GFR calc Af Amer: >90 mL/min (ref >90)
GFR, EST NON AFRICAN AMERICAN: 79 mL/min — AB (ref >90)
GLUCOSE: 105 mg/dL — AB (ref 70–99)
Potassium: 4.1 mEq/L (ref 3.7–5.3)
SODIUM: 139 meq/L (ref 137–147)

## 2014-02-27 LAB — TYPE AND SCREEN
ABO/RH(D): O POS
Antibody Screen: NEGATIVE
Unit division: 0

## 2014-02-27 LAB — CBC
HCT: 25.1 % — ABNORMAL LOW (ref 39.0–52.0)
HEMOGLOBIN: 8.2 g/dL — AB (ref 13.0–17.0)
MCH: 26.6 pg (ref 26.0–34.0)
MCHC: 32.7 g/dL (ref 30.0–36.0)
MCV: 81.5 fL (ref 78.0–100.0)
Platelets: 87 10*3/uL — ABNORMAL LOW (ref 150–400)
RBC: 3.08 MIL/uL — ABNORMAL LOW (ref 4.22–5.81)
RDW: 20 % — ABNORMAL HIGH (ref 11.5–15.5)
WBC: 5.5 10*3/uL (ref 4.0–10.5)

## 2014-02-27 LAB — CLOSTRIDIUM DIFFICILE BY PCR: Toxigenic C. Difficile by PCR: POSITIVE — AB

## 2014-02-27 MED ORDER — VANCOMYCIN 50 MG/ML ORAL SOLUTION
125.0000 mg | Freq: Four times a day (QID) | ORAL | Status: DC
  Administered 2014-02-27 – 2014-03-01 (×9): 125 mg via ORAL
  Filled 2014-02-27 (×11): qty 2.5

## 2014-02-27 MED ORDER — METRONIDAZOLE 500 MG PO TABS
500.0000 mg | ORAL_TABLET | Freq: Three times a day (TID) | ORAL | Status: DC
  Administered 2014-02-27: 500 mg via ORAL
  Filled 2014-02-27 (×4): qty 1

## 2014-02-27 NOTE — Progress Notes (Signed)
SLP Cancellation Note  Patient Details Name: [name] MRN: [id number] DOB: 08/28/1929   Cancelled treatment:       ReasonTreat Not Completed: Pt NPO for procedure.  Will f/u next date for dysphagia.    Juan Quam Laurice 02/27/2014, 3:25 PM

## 2014-02-27 NOTE — Clinical Social Work Psychosocial (Signed)
Clinical Social Work Department BRIEF PSYCHOSOCIAL ASSESSMENT 02/27/2014  Patient:  [name], [name]     Account Number:  [id number]     Admit date:  02/21/2014  Clinical Social Worker:  Lovey Newcomer  Date/Time:  02/27/2014 03:30 PM  Referred by:  Physician  Date Referred:  02/27/2014 Referred for  SNF Placement   Other Referral:   NA   Interview type:  Family Other interview type:   Patient's daughter [name] interviewed to complete assessment.    PSYCHOSOCIAL DATA Living Status:  WIFE Admitted from facility:  [facility] Level of care:  [place] Primary support name:  [name] Primary support relationship to patient:  CHILD, ADULT Degree of support available:   Support is strong.    CURRENT CONCERNS Current Concerns  Post-Acute Placement   Other Concerns:   NA    SOCIAL WORK ASSESSMENT / PLAN CSW went by room but no family currently present. CSW spoke with patient's daughter [name] by phone to complete assessment. Patient was admitted from [facility]. Daughter stated that she would like the patient to return to this facility at discharge. CSW informed the daughter that [facility] has contacted CSW and stated that they would not be accepting the patient back because they do not have a male bed available and the patient did not particpate in therapies. Daughter stated that she would like to try [facility]. CSW explained that [facility] has not offered the patient a bed at this time, and continued to provide daughter with the available bed offers. Daughter states that she plans to call [facility] to see if patient can be admitted to the facility. CSW explained that patient will likely be ready for DC on 02/27/14 and that SNF decision will likely need to be made quickly. CSW explained SNF search/placement process and answered daughter's questions. CSW will assist as appropriate.   Assessment/plan status:  Psychosocial  Support/Ongoing Assessment of Needs Other assessment/ plan:   COmplete FL[facility], Fax, PASRR   Information/referral to community resources:   CSW contact information and SNF list given.    PATIENT'S/FAMILY'S RESPONSE TO PLAN OF CARE: Patient's family plans for patient to DC to SNF or home (discussion to be had among children and patient). CSW will assist.         Liz Beach MSW, [place], [place], [phone]

## 2014-02-27 NOTE — Progress Notes (Signed)
NUTRITION FOLLOW-UP  INTERVENTION: Continue Jevity 1.2 @ 60 ml/hr via PEG tube.  30 ml Prostat TID.    Tube feeding regimen provides 2028 kcal (100% of needs), 125 grams of protein, and 1162 ml of H2O.   Recommend free water flushes of 300 mL TID to provide additional 900 mL of fluid.  If pt would prefer bolus TF at home recommend:  1.5 cans of Jevity 1.2 QID with 60 ml H2O before and after each feeding and 240 ml H2O bid.  NUTRITION DIAGNOSIS: Inadequate oral intake related to inability to eat as evidenced by NPO; ongoing.   Goal: Pt to meet >/= 90% of their estimated nutrition needs; met.   Monitor:  Weight trend, toleration of TF, bowel movements, labs   ASSESSMENT: 80 year old male patient with a very complex past medical history. He has a known left lower extremity DVT dating back to Feb 2015 but had to stop anticoagulation because of gross hematuria and subsequently an IVC filter was placed. 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 He had a PICC line placed and received IV cefepime. He was dc'd to a nursing facility. He was dx'd with dysphagia and poor oral intake with severe DH during a hospitalization in early December of this year. A PEG tube was placed at that time.  Pt presented to [facility] with a fever and Pseudomonas graft infection with bacteremia 12/10. Transferred to [facility] 12/11.  Pt with c.diff diarrhea.  Spoke with case Freight forwarder. We discussed recommendations for bolus if pt transitions to home. Pt is considering SNF due to c.diff.     Height: Ht Readings from Last 1 Encounters:  02/21/14 _0  (1.88 m)    Weight: Wt Readings from Last 1 Encounters:  02/27/14 213 lb 3 oz (96.7 kg)    BMI:  Body mass index is 27.36 kg/(m^2).  Estimated Nutritional Needs: Kcal: 2000-2200 Protein: 120-135 g Fluid: 2.0-2.2  L/day  Skin: Intact  Diet Order: DIET DYS 3 Pt refused   Intake/Output Summary (Last 24 hours) at 02/27/14 1519 Last data filed at 02/27/14 1200  Gross per 24 hour  Intake  45497 ml  Output  54240 ml  Net  -8743 ml    Last BM: 12/16   Labs:   Recent Labs Lab 02/23/14 0049 02/24/14 0300 02/25/14 0434 02/27/14 0500  NA 138 141 136* 139  K 3.9 3.8 4.1 4.1  CL 107 107 102 103  CO2 _1 BUN 31* 23 21 24*  CREATININE 1.10 0.86 0.88 0.84  CALCIUM 8.6 8.7 8.6 8.8  MG 1.8  --   --   --   PHOS 2.4  --   --   --   GLUCOSE 107* 109* 98 105*    CBG (last 3)  No results for input(s): GLUCAP in the last 72 hours.  Scheduled Meds: . doxazosin  4 mg Per Tube QHS  . free water  100 mL Per Tube 3 times per day  . meropenem (MERREM) IV  1 g Intravenous 3 times per day  . methylphenidate  5 mg Per Tube Daily  . metoprolol tartrate  25 mg Per Tube BID  . mirtazapine  30 mg Per Tube QHS  . pantoprazole sodium  40 mg Per Tube Daily  . polyethylene glycol  17 g Per Tube Daily  . rifampin  300 mg Oral Q12H  .  sodium chloride irrigation  3,000 mL Bladder Irrigation Q24H  . vancomycin  125 mg Oral QID    Continuous Infusions: . sodium chloride 10 mL/hr at 02/24/14 1444  . feeding supplement (JEVITY 1.2 CAL) 1,000 mL (02/27/14 0539)   [place], [place], [place] Pager [phone] After Hours Pager

## 2014-02-27 NOTE — Clinical Social Work Placement (Signed)
Clinical Social Work Department CLINICAL SOCIAL WORK PLACEMENT NOTE 02/27/2014  Patient:  [name], [name]  Account Number:  [id number] Admit date:  02/21/2014  Clinical Social Worker:  Kemper Durie, [place]  Date/time:  02/27/2014 03:00 PM  Clinical Social Work is seeking post-discharge placement for this patient at the following level of care:   [place]   (*CSW will update this form in Epic as items are completed)   02/27/2014  Patient/family provided with [place] Department of Clinical Social Work's list of facilities offering this level of care within the geographic area requested by the patient (or if unable, by the patient's family).  02/27/2014  Patient/family informed of their freedom to choose among providers that offer the needed level of care, that participate in Medicare, Medicaid or managed care program needed by the patient, have an available bed and are willing to accept the patient.  02/27/2014  Patient/family informed of [facility]' ownership interest in [facility], as well as of the fact that they are under no obligation to receive care at this facility.  PASARR submitted to EDS on  PASARR number received on   FL2 transmitted to all facilities in geographic area requested by pt/family on  02/27/2014 FL2 transmitted to all facilities within larger geographic area on 02/27/2014  Patient informed that his/her managed care company has contracts with or will negotiate with  certain facilities, including the following:     Patient/family informed of bed offers received:  02/27/2014 Patient chooses bed at  Physician recommends and patient chooses bed at    Patient to be transferred to  on   Patient to be transferred to facility by  Patient and family notified of transfer on  Name of family member notified:    The following physician request were entered in Epic:   Additional Comments:    Liz Beach MSW, [place], [place],  [phone]

## 2014-02-27 NOTE — Care Management Note (Signed)
    Page 1 of 2   02/28/2014     3:11:28 PM CARE MANAGEMENT NOTE 02/28/2014  Patient:  SIGISMUND, CROSS   Account Number:  [id number]  Date Initiated:  02/27/2014  Documentation initiated by:  Tomi Bamberger  Subjective/Objective Assessment:   dx bacteremia from aortic graft , pos c dif as of 12/16.  admit- lives with spouse.     Action/Plan:   Anticipated DC Date:  03/01/2014   Anticipated DC Plan:  SKILLED NURSING FACILITY  In-house referral  Clinical Social Worker      DC Planning Services  CM consult      Choice offered to / List presented to:     DME arranged  HOSPITAL BED      DME agency  [place] arranged  HH-1 RN  [place]      [place].   Status of service:  In process, will continue to follow Medicare Important Message given?  YES (If response is "NO", the following Medicare IM given date fields will be blank) Date Medicare IM given:  02/25/2014 Medicare IM given by:  Tomi Bamberger Date Additional Medicare IM given:  02/28/2014 Additional Medicare IM given by:  Tomi Bamberger  Discharge Disposition:  [place]  Per UR Regulation:  Reviewed for med. necessity/level of care/duration of stay  If discussed at [place] of Stay Meetings, dates discussed:    Comments:  02/28/14 [place], BSN [phone] NCM never received call back from daughter, [name].  Per CSW   she has made decision to go to snf. Patient is having dirrhea today and will ned voiding trial per MD,plan for snf tomorrow.  02/27/14 [place] ,BSN [phone] patient lives with spouse.  NCM received referral today for [facility] services for [facility], PT, OT, SW, Resp Care and ST.  NCM notifed [facility] of the services needed, patient is [place].  NCM spoke with [name] the daughter over the phone and she states she needs to talk to patient and other  family members about disposition, she is not sure they want him to come home because of the  Bacteremia and Cdiff ,  NCM asked about the snf, she stated they were not sure, NCM informed her that patient will most likely be ready for dc tomorrow. Daughgter states she will call me back after she talks with her family members.

## 2014-02-27 NOTE — Progress Notes (Signed)
Physical Therapy Treatment Patient Details Name: ALEXY HELDT MRN: [id number] DOB: Jul 21, 1929 Today's Date: 02/27/2014    History of Present Illness [name] is a 80 y.o. male who presents to the Emergency Department from nursing home with fever and tachycardia.. The facility suspected sepsis. He had a PICC line removed yesterday. 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 He also has a feeding tube on his abdomen.    PT Comments    Even though pt was in intermittent pain from the catheter and felt bad, pt agreed to participate with encouragement.  Progressing slowly, but able to mobilize today in the RW.  Pt wants to get home, but needs some time in rehab at [facility].  Follow Up Recommendations  SNF     Equipment Recommendations  None recommended by PT    Recommendations for Other Services       Precautions / Restrictions Precautions Precautions: Fall Precaution Comments: HOB needs to be at 30 degrees when receiving tube feeding    Mobility  Bed Mobility Overal bed mobility: Needs Assistance Bed Mobility: Supine to Sit     Supine to sit: Mod assist     General bed mobility comments: lift assist to come up onto L elbow and further forward.  scooting assist  Transfers Overall transfer level: Needs assistance Equipment used: Rolling walker (2 wheeled) Transfers: Sit to/from Stand Sit to Stand: Mod assist;+2 physical assistance (x2, much less coordinated stand once pt fatigued)         General transfer comment: Pt needed assist to come forward and assist with lift to stand  Ambulation/Gait Ambulation/Gait assistance: Mod assist;+2 safety/equipment Ambulation Distance (Feet): 23 Feet Assistive device: Rolling walker (2 wheeled) Gait Pattern/deviations: Step-through pattern;Decreased step length - right;Decreased step length - left Gait  velocity: slow   General Gait Details: stiff gait with short steps and need to help maneuver RW   Stairs            Wheelchair Mobility    Modified Rankin (Stroke Patients Only)       Balance Overall balance assessment: Needs assistance Sitting-balance support: Single extremity supported Sitting balance-Leahy Scale: Fair   Postural control: Posterior lean Standing balance support: Bilateral upper extremity supported Standing balance-Leahy Scale: Poor Standing balance comment: able to attain fully upright positioning, but pt needed RW                    Cognition Arousal/Alertness: Awake/alert Behavior During Therapy: WFL for tasks assessed/performed Overall Cognitive Status: Within Functional Limits for tasks assessed                      Exercises      General Comments        Pertinent Vitals/Pain Pain Assessment: 0-10 Pain Score: 10-Worst pain ever Pain Location: catheter pain Pain Descriptors / Indicators: Burning Pain Intervention(s): Monitored during session;Limited activity within patient's tolerance    Home Living                      Prior Function            PT Goals (current goals can now be found in the care plan section) Acute Rehab PT Goals Patient Stated Goal: none stated PT Goal Formulation: With patient Time For Goal Achievement: 03/11/14 Potential to Achieve Goals: Fair Progress towards PT goals: Progressing toward goals  Frequency  Min 3X/week    PT Plan Current plan remains appropriate    Co-evaluation             End of Session   Activity Tolerance: Patient limited by fatigue;Patient tolerated treatment well Patient left: in bed;with call bell/phone within reach;with bed alarm set;with nursing/sitter in room     Time: 1100-1125 PT Time Calculation (min) (ACUTE ONLY): 25 min  Charges:  $Gait Training: 8-22 mins $Therapeutic Activity: 8-22 mins                    G Codes:       Annis Lagoy, Tessie Fass 02/27/2014, 2:05 PM 02/27/2014  Donnella Sham, PT [phone]  (pager)

## 2014-02-27 NOTE — Progress Notes (Signed)
Progress Note  [name]:[id number] DOB: March 29, 1929 DOA: 02/21/2014 PCP: [name], MD  Brief narrative: 80 year old male patient with a very complex past medical history. He has a known left lower extremity DVT dating back to Feb 2015 but had to stop anticoagulation because of gross hematuria and subsequently an IVC filter was placed. In July 2015 (at [facility] he underwent repair of a thoraco-abdominal aortic aneurysm but unfortunately this procedure was complicated by a Pseudomonas graft infection. He had a PICC line placed and received IV cefepime. He was dc'd to a nursing facility. He was dx'd with dysphagia and poor oral intake with severe DH during a hospitalization in early December of this year. A PEG tube was placed at that time.  With regard to the Pseudomonas graft infection with bacteremia, this was initially diagnosed in October 2015 and was pansensitive. He has been followed by infectious disease service from [facility] since that time. His PICC line was removed on 12/9 and antibiotics were changed to Levaquin per PEG.   He presented to [facility] on 12/10 with a temperature of 103.3. This was associated with hypotension. He was empirically started on vancomycin and cefepime. His chest x-ray was unremarkable. He had a mild leukopenia with a white cell count of 3400. Urinalysis was not consistent with UTI. Plans were to initially to send the patient to [facility] to resume treatment.  In the interim the family decided that they would like to be transferred to [facility] and request consultation with ID physicians there. He was transferred via ambulance to [facility] and upon arrival was hemodynamically stable and was not hypoxic.  HPI/Subjective: Patient with no complaints.  Son reports his sisters would like to care for their father at home with home health services.  Assessment/Plan:  Septic shock - resolved sepsis physiology has  resolved  History of Bacteremia due to Pseudomonas Infected aortic graft.  Now 1/2 blood cx + Proteus Mirabilis ID consulted - cont meropenem and rifampin for a total of 14 days.  Then resume previous pseudomonal coverage with Levaquin.  CTA evaluation of chest and abdomen does not reveal any evidence of abscess or infection involving the AAA graft.  Gall Bladder u/s pending to evaluate for possible infection.  C-diff diarrhea Patient was discovered to be c-diff positive on 12/16 and was started on oral vancomycin per ID recommendation.  Patient should remain on oral vanc for 1 week after discontinuation of other antibiotics.  Left upper lobe infiltrate.  HCAP Seen on CTA chest.   Speech Therapy on board for possible aspiration.  ID guiding antibiotic therapy.  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 Appreciate urology consultation.  Foley placed due to acute urinary retention. Will discontinue continuous bladder irrigation as urine is now clear.  If patient's urine remains clear we will consider a voiding trial on 12/17 prior to discharge.  If not, patient will likely need cystoscopy.   Hold aspirin and plavix. Patient requiring transfusion of 1 unit of PRBCs on 12/15.  Hopeful to d/c foley on 12/17 for voiding trial.  Anemia with ABLA secondary to hematuria. Transfuse 1 unit of PRBCs 02/26/2014. Check CBC q 12 hours x 2.  HTN BP improved . Metoprolol resumed.   Thrombocytopenia chronic thrombocytopenia but presenting levels were lower than his baseline likely due to gram-negative infection -  plt count is now improving   Stage III chronic kidney disease Baseline renal function BUN 26 creatinine 1.41 - creatinine now better than baseline (0.84) s/p volume resuscitation - follow - GFR now normal   History of DVT of lower extremity (Feb 2015) - post IVC  filter no lower extremity edema  Protein-calorie malnutrition, severe PEG tube feeds to continue - lytes stable   Dysphagia/Status post insertion of PEG tube started on tube feedings during last admission; continue Jevity - able to tolerate oral intake for pleasure  Speech evaluation to define diet.   Palliative Medicine Appreciate Palliative Consultation.  Family wants full / aggressive care.  PM signed off.  DVT prophylaxis: SCDs + prior IVC filter Code Status: Full Family Communication: Son Shanon Brow at bedside. Disposition Plan/Expected LOS:  PT recommends SNF.  Patient's family is reconsidering SNF.  Consultants: Infectious Disease Urology  Procedures: None Continuous bladder irrigation.  Antibiotics: Cefepime 12/10 > 12/11 Vancomycin 12/10 >12/13 Levaquin 12/10 Meropenem 12/11 >12/25 Rifampin 12/11 >12/25 Oral Vancomycin 12/16 >1/2.  Objective: Blood pressure 113/75, pulse 109, temperature 98.2 F (36.8 C), temperature source Oral, resp. rate 16, height 6\' 2"  (1.88 m), weight 96.7 kg (213 lb 3 oz), SpO2 100 %.  Intake/Output Summary (Last 24 hours) at 02/27/14 1411 Last data filed at 02/27/14 1000  Gross per 24 hour  Intake 46144.5 ml  Output  56540 ml  Net -10395.5 ml   Exam: Gen: Awakens easily.  Alert, appropriate.  HOH, Chronically ill appearing gentleman. Son, Shanon Brow at bedside. Chest: Good air movement with no frank w/c/r Cardiac: regular rate,  3/6 systolic murmur, no lower extremity edema Abdomen: Soft nontender nondistended without  hepatosplenomegaly, no ascites; PEG tube site unremarkable GU:  Foley in place.  Urine in foley bag is light yellow with 2 dark clots. Extremities: no edema, 5/5 strength in each.    Scheduled Meds:  Scheduled Meds: . doxazosin  4 mg Per Tube QHS  . free water  100 mL Per Tube 3 times per day  . meropenem (MERREM) IV  1 g Intravenous 3 times per day  . methylphenidate  5 mg Per Tube Daily  . metoprolol tartrate  25 mg  Per Tube BID  . mirtazapine  30 mg Per Tube QHS  . pantoprazole sodium  40 mg Per Tube Daily  . polyethylene glycol  17 g Per Tube Daily  . rifampin  300 mg Oral Q12H  . sodium chloride irrigation  3,000 mL Bladder Irrigation Q24H  . vancomycin  125 mg Oral QID   Continuous Infusions: . sodium chloride 10 mL/hr at 02/24/14 1444  . feeding supplement (JEVITY 1.2 CAL) 1,000 mL (02/27/14 0539)    Data Reviewed: Basic Metabolic Panel:  Recent Labs Lab 02/21/14 1127 02/23/14 0049 02/24/14 0300 02/25/14 0434 02/27/14 0500  NA 139 138 141 136* 139  K 4.3 3.9 3.8 4.1 4.1  CL 100 107 107 102 103  CO2 23 19 23 23 23   GLUCOSE 91 107* 109* 98 105*  BUN 41* 31* 23 21 24*  CREATININE 1.38* 1.10 0.86 0.88 0.84  CALCIUM 9.5 8.6 8.7 8.6 8.8  MG  --  1.8  --   --   --   PHOS  --  2.4  --   --   --    Liver Function Tests:  Recent Labs Lab 02/21/14 1127 02/23/14 0049 02/24/14 0300  AST 26 24 24   ALT 13 13 12   ALKPHOS 69 52 66  BILITOT 0.6  0.4 0.6  PROT 7.0 5.4* 5.5*  ALBUMIN 2.5* 1.7* 1.8*   CBC:  Recent Labs Lab 02/21/14 1127  02/24/14 0300 02/25/14 0434 02/25/14 1147 02/25/14 1843 02/26/14 0541 02/27/14 0500  WBC 3.4*  < > 3.2* 4.5 3.9*  --  4.8 5.5  NEUTROABS 3.0  --   --   --   --   --   --   --   HGB 9.5*  < > 7.5* 7.7* 7.8* 8.2* 7.1* 8.2*  HCT 29.5*  < > 23.0* 23.3* 23.2* 24.4* 21.8* 25.1*  MCV 82.6  < > 81.0 80.9 81.1  --  79.3 81.5  PLT 103*  < > 75* 87* 87*  --  84* 87*  < > = values in this interval not displayed.  Recent Results (from the past 240 hour(s))  Blood culture (routine x 2)     Status: None   Collection Time: 02/21/14 12:04 PM  Result Value Ref Range Status   Specimen Description BLOOD LEFT ANTECUBITAL  Final   Special Requests   Final    BOTTLES DRAWN AEROBIC AND ANAEROBIC 5CC  IMMUNE:COMPROMISED   Culture  Setup Time   Final    02/23/2014 00:32 Performed at [facility]    Culture   Final    PROTEUS MIRABILIS Note: Gram  Stain Report Called to,Read Back By and Verified With: CARDONE T. AT 1400 ON 02/22/14 BY ISLEY B. Performed at [facility] Performed at [facility]    Report Status 02/25/2014 FINAL  Final   Organism ID, Bacteria PROTEUS MIRABILIS  Final      Susceptibility   Proteus mirabilis - MIC*    AMPICILLIN <=2 SENSITIVE Sensitive     AMPICILLIN/SULBACTAM <=2 SENSITIVE Sensitive     CEFAZOLIN <=4 SENSITIVE Sensitive     CEFEPIME <=1 SENSITIVE Sensitive     CEFTAZIDIME <=1 SENSITIVE Sensitive     CEFTRIAXONE <=1 SENSITIVE Sensitive     CIPROFLOXACIN >=4 RESISTANT Resistant     GENTAMICIN >=16 RESISTANT Resistant     IMIPENEM 1 SENSITIVE Sensitive     PIP/TAZO <=4 SENSITIVE Sensitive     TOBRAMYCIN 8 INTERMEDIATE Intermediate     TRIMETH/SULFA >=320 RESISTANT Resistant     * PROTEUS MIRABILIS  Blood culture (routine x 2)     Status: None   Collection Time: 02/21/14 12:18 PM  Result Value Ref Range Status   Specimen Description BLOOD LEFT HAND  Final   Special Requests   Final    BOTTLES DRAWN AEROBIC AND ANAEROBIC 5CC  IMMUNE:COMPROMISED   Culture NO GROWTH 5 DAYS  Final   Report Status 02/26/2014 FINAL  Final  MRSA PCR Screening     Status: None   Collection Time: 02/21/14  9:58 PM  Result Value Ref Range Status   MRSA by PCR NEGATIVE NEGATIVE Final    Comment:        The GeneXpert MRSA Assay (FDA approved for NASAL specimens only), is one component of a comprehensive MRSA colonization surveillance program. It is not intended to diagnose MRSA infection nor to guide or monitor treatment for MRSA infections.   Urine culture     Status: None   Collection Time: 02/22/14 10:29 AM  Result Value Ref Range Status   Specimen Description URINE, CATHETERIZED  Final   Special Requests Normal  Final   Culture  Setup Time   Final    02/22/2014 17:17 Performed at [place]  GROWTH Performed at [facility]   Final   Culture NO  GROWTH Performed at [facility]   Final   Report Status 02/23/2014 FINAL  Final  Clostridium Difficile by PCR     Status: Abnormal   Collection Time: 02/26/14  3:56 PM  Result Value Ref Range Status   C difficile by pcr POSITIVE (A) NEGATIVE Final    Comment: CRITICAL RESULT CALLED TO, READ BACK BY AND VERIFIED WITH: Terrance Mass RN 02/27/14 0830 COSTELLO B      Studies:  Recent x-ray studies have been reviewed in detail by the Attending Physician  Time spent :  35 mins   Imogene Burn, [place] [facility] Pager: [phone]   On-Call/Text Page:      [email or web address]      password [facility]  02/27/2014, 2:11 PM   LOS: 6 days   Patient seen and examined. Agree with above NP, done  by Imogene Burn, PA-C.  Patient with C diff positive.  RUQ [place] ordered. Started on oral vancomycin for c diff.  Hematuria resolving.  Niel Hummer, MD.

## 2014-02-28 LAB — CBC WITH DIFFERENTIAL/PLATELET
BASOS ABS: 0 10*3/uL (ref 0.0–0.1)
Basophils Relative: 1 % (ref 0–1)
EOS ABS: 0 10*3/uL (ref 0.0–0.7)
EOS PCT: 0 % (ref 0–5)
HCT: 24.7 % — ABNORMAL LOW (ref 39.0–52.0)
Hemoglobin: 8 g/dL — ABNORMAL LOW (ref 13.0–17.0)
Lymphocytes Relative: 28 % (ref 12–46)
Lymphs Abs: 1.3 10*3/uL (ref 0.7–4.0)
MCH: 26.8 pg (ref 26.0–34.0)
MCHC: 32.4 g/dL (ref 30.0–36.0)
MCV: 82.9 fL (ref 78.0–100.0)
Monocytes Absolute: 0.6 10*3/uL (ref 0.1–1.0)
Monocytes Relative: 12 % (ref 3–12)
Neutro Abs: 2.7 10*3/uL (ref 1.7–7.7)
Neutrophils Relative %: 59 % (ref 43–77)
PLATELETS: 103 10*3/uL — AB (ref 150–400)
RBC: 2.98 MIL/uL — AB (ref 4.22–5.81)
RDW: 19.8 % — AB (ref 11.5–15.5)
WBC: 4.6 10*3/uL (ref 4.0–10.5)

## 2014-02-28 LAB — BASIC METABOLIC PANEL
ANION GAP: 8 (ref 5–15)
BUN: 24 mg/dL — ABNORMAL HIGH (ref 6–23)
CALCIUM: 8.8 mg/dL (ref 8.4–10.5)
CO2: 25 mEq/L (ref 19–32)
Chloride: 103 mEq/L (ref 96–112)
Creatinine, Ser: 0.8 mg/dL (ref 0.50–1.35)
GFR, EST NON AFRICAN AMERICAN: 81 mL/min — AB (ref >90)
GLUCOSE: 118 mg/dL — AB (ref 70–99)
POTASSIUM: 4 meq/L (ref 3.7–5.3)
SODIUM: 136 meq/L — AB (ref 137–147)

## 2014-02-28 MED ORDER — SODIUM CHLORIDE 0.9 % IJ SOLN
10.0000 mL | INTRAMUSCULAR | Status: DC | PRN
  Administered 2014-03-01: 10 mL
  Filled 2014-02-28: qty 40

## 2014-02-28 MED ORDER — ALBUTEROL SULFATE (2.5 MG/3ML) 0.083% IN NEBU: 2.5000 mg | INHALATION_SOLUTION | RESPIRATORY_TRACT | Status: DC | PRN

## 2014-02-28 MED ORDER — SODIUM CHLORIDE 0.9 % IV SOLN: 1.0000 g | Freq: Three times a day (TID) | INTRAVENOUS | Status: DC

## 2014-02-28 MED ORDER — TRAMADOL HCL 50 MG PO TABS: 50.0000 mg | ORAL_TABLET | Freq: Four times a day (QID) | ORAL | Status: DC | PRN

## 2014-02-28 MED ORDER — VANCOMYCIN 50 MG/ML ORAL SOLUTION: ORAL | Status: DC

## 2014-02-28 MED ORDER — LORAZEPAM 2 MG/ML IJ SOLN
0.5000 mg | Freq: Once | INTRAMUSCULAR | Status: DC | PRN
  Filled 2014-02-28: qty 1

## 2014-02-28 MED ORDER — RIFAMPIN 300 MG PO CAPS: 300.0000 mg | ORAL_CAPSULE | Freq: Two times a day (BID) | ORAL | Status: AC

## 2014-02-28 MED ORDER — LEVOFLOXACIN 750 MG PO TABS: ORAL_TABLET | ORAL | Status: DC

## 2014-02-28 MED ORDER — LORAZEPAM 2 MG/ML PO CONC: 0.2500 mg | Freq: Four times a day (QID) | ORAL | Status: DC | PRN

## 2014-02-28 MED ORDER — LORAZEPAM 2 MG/ML IJ SOLN
0.5000 mg | Freq: Once | INTRAMUSCULAR | Status: AC
  Administered 2014-02-28: 0.5 mg via INTRAVENOUS

## 2014-02-28 MED ORDER — FREE WATER: 100.0000 mL | Freq: Three times a day (TID) | Status: DC

## 2014-02-28 NOTE — Clinical Social Work Note (Signed)
CSW spoke with patient's daughter [name] to inform her that the patient is for discharge today. Daughter states that she will give CSW decision on SNF by 2:00PM today. She states that no one will be able to complete paperwork for patient at a facility today and states that she will not be able to do paperwork until Friday. CSW explained to daughter that this is not a reason for the patient to remain in the hospital for an additional night as he is medically stable for DC today after PICC placement. CSW reiterated that the patient would be DC'ed today if medically stable and that paperwork would need to be completed with facility today. CSW also left message with Romelle Starcher to see if Clarene Critchley has a fax machine at her business where paperwork could be sent for completion. CSW has made RNCM aware of potential difficulty with DC.   Liz Beach MSW, [place], [place], [phone]

## 2014-02-28 NOTE — Progress Notes (Signed)
Progress Note  [name] JKD:[id number] DOB: 07-26-1929 DOA: 02/21/2014 PCP: [name], MD  Brief narrative: 80 year old male patient with a very complex past medical history. He has a known left lower extremity DVT dating back to Feb 2015 but had to stop anticoagulation because of gross hematuria and subsequently an IVC filter was placed. 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 He had a PICC line placed and received IV cefepime. He was dc'd to a nursing facility. He was dx'd with dysphagia and poor oral intake with severe DH during a hospitalization in early December of this year. A PEG tube was placed at that time.  With regard to the Pseudomonas graft infection with bacteremia, this was initially diagnosed in October 2015 and was pansensitive. He has been followed by infectious disease service from [facility] since that time. His PICC line was removed on 12/9 and antibiotics were changed to Levaquin per PEG.   He presented to [facility] on 12/10 with a temperature of 103.3. This was associated with hypotension. He was empirically started on vancomycin and cefepime. His chest x-ray was unremarkable. He had a mild leukopenia with a white cell count of 3400. Urinalysis was not consistent with UTI. Plans were to initially to send the patient to [facility] to resume treatment.  In the interim the family decided that they would like to be transferred to [facility] and request consultation with ID physicians there. He was transferred via ambulance to [facility] and upon arrival was hemodynamically stable and was not hypoxic.  HPI/Subjective: Patient tearful.  At first refused PICC placement, but now agreeable.  Assessment/Plan:  Septic shock - resolved sepsis physiology has resolved  History of Bacteremia due to Pseudomonas Infected  aortic graft.  Now 1/2 blood cx + Proteus Mirabilis ID consulted - cont meropenem and rifampin for a total of 14 days.  Then resume previous pseudomonal coverage with Levaquin indefintely.  CTA evaluation of chest and abdomen does not reveal any evidence of abscess or infection involving the AAA graft.  Gall bladder U/S did not reveal a source of infection.  C-diff diarrhea Patient was discovered to be c-diff positive on 12/16 and was started on oral vancomycin per ID recommendation.  Patient should remain on oral vanc indefinitely until he is seen at the [facility] ID clinic in follow up as he will be on continuous antibiotics (resuming levaquin when meropenem and rifampin are completed).  Left upper lobe infiltrate.  HCAP Seen on CTA chest.   Speech Therapy did not see signs of aspiration. Already on antibiotic therapy.  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 Appreciate urology consultation.  Foley placed due to acute urinary retention. He developed hematuria and was treated with continuous bladder irrigation for approximately 24 hours. Patient received transfusion of 1 unit of PRBCs on 12/15.   He urine remained tinged orange/pink.  This color was felt to be due to rifampin.  Will remove foley on 12/17 for a voiding trial.  If hematuria returns the patient will likely need cystoscopy.   Hold aspirin and plavix until 12/23 (one week after the hematuria stopped).    Anemia with ABLA secondary to hematuria. Transfuse 1 unit of PRBCs 02/26/2014. Check CBC q 12 hours  x 2.  HTN BP improved . Metoprolol resumed.   Thrombocytopenia chronic thrombocytopenia but presenting levels were lower than his baseline likely due to gram-negative infection - plt count is now improving   Stage III chronic kidney disease Baseline renal function BUN 26 creatinine 1.41 - creatinine now  better than baseline (0.84) s/p volume resuscitation - follow - GFR now normal   History of DVT of lower extremity (Feb 2015) - post IVC filter no lower extremity edema  Protein-calorie malnutrition, severe PEG tube feeds to continue - lytes stable   Dysphagia/Status post insertion of PEG tube started on tube feedings during last admission; continue Jevity - able to tolerate oral intake for pleasure  Speech evaluation to define diet.   Palliative Medicine Appreciate Palliative Consultation.  Planned [place] meeting on 12/18 at 12:00 pm.  DVT prophylaxis: SCDs + prior IVC filter Code Status: Full.  Will be addressed in [place] meeting on 12/18. Family Communication: Called dtr [name]. Disposition Plan/Expected LOS:  To SNF.  Consultants: Infectious Disease Urology Pallliative.  Procedures: None Continuous bladder irrigation.  Antibiotics: Cefepime 12/10 > 12/11 Vancomycin 12/10 >12/13 Levaquin 12/10.  Restart 12/25 and continue indefinitely. Meropenem 12/11 >12/24 Rifampin 12/11 >12/24 Oral Vancomycin 12/16 > until seen by ID at [facility].  Objective: Blood pressure 105/56, pulse 88, temperature 97.7 F (36.5 C), temperature source Oral, resp. rate 13, height 6\' 2"  (1.88 m), weight 94.2 kg (207 lb 10.8 oz), SpO2 99 %.  Intake/Output Summary (Last 24 hours) at 02/28/14 1221 Last data filed at 02/28/14 0017  Gross per 24 hour  Intake    200 ml  Output   1150 ml  Net   -950 ml   Exam: Gen: elderly male, appears chronically ill.  Frustrated and tearful over his current health. Chest: Good air movement with no frank w/c/r Cardiac: slightly tachy,  3/6 systolic murmur, no lower extremity edema Abdomen: Soft nontender nondistended without  hepatosplenomegaly, no ascites; PEG tube site unremarkable GU:  Foley in place.  Urine in foley bag is light pink/orange. Extremities: no edema, 5/5 strength in each.    Scheduled Meds:  Scheduled Meds: . doxazosin  4 mg Per Tube QHS  . free  water  100 mL Per Tube 3 times per day  . meropenem (MERREM) IV  1 g Intravenous 3 times per day  . methylphenidate  5 mg Per Tube Daily  . metoprolol tartrate  25 mg Per Tube BID  . mirtazapine  30 mg Per Tube QHS  . pantoprazole sodium  40 mg Per Tube Daily  . polyethylene glycol  17 g Per Tube Daily  . rifampin  300 mg Oral Q12H  . sodium chloride irrigation  3,000 mL Bladder Irrigation Q24H  . vancomycin  125 mg Oral QID   Continuous Infusions: . sodium chloride 10 mL/hr at 02/24/14 1444  . feeding supplement (JEVITY 1.2 CAL) 1,000 mL (02/27/14 0539)    Data Reviewed: Basic Metabolic Panel:  Recent Labs Lab 02/23/14 0049 02/24/14 0300 02/25/14 0434 02/27/14 0500 02/28/14 0934  NA 138 141 136* 139 136*  K 3.9 3.8 4.1 4.1 4.0  CL 107 107 102 103 103  CO2 19 23 23 23 25   GLUCOSE 107* 109* 98 105* 118*  BUN 31* 23 21 24* 24*  CREATININE 1.10 0.86 0.88 0.84 0.80  CALCIUM 8.6 8.7 8.6 8.8 8.8  MG 1.8  --   --   --   --   PHOS 2.4  --   --   --   --  Liver Function Tests:  Recent Labs Lab 02/23/14 0049 02/24/14 0300  AST 24 24  ALT 13 12  ALKPHOS 52 66  BILITOT 0.4 0.6  PROT 5.4* 5.5*  ALBUMIN 1.7* 1.8*   CBC:  Recent Labs Lab 02/25/14 0434 02/25/14 1147 02/25/14 1843 02/26/14 0541 02/27/14 0500 02/28/14 0934  WBC 4.5 3.9*  --  4.8 5.5 4.6  NEUTROABS  --   --   --   --   --  2.7  HGB 7.7* 7.8* 8.2* 7.1* 8.2* 8.0*  HCT 23.3* 23.2* 24.4* 21.8* 25.1* 24.7*  MCV 80.9 81.1  --  79.3 81.5 82.9  PLT 87* 87*  --  84* 87* 103*    Recent Results (from the past 240 hour(s))  Blood culture (routine x 2)     Status: None   Collection Time: 02/21/14 12:04 PM  Result Value Ref Range Status   Specimen Description BLOOD LEFT ANTECUBITAL  Final   Special Requests   Final    BOTTLES DRAWN AEROBIC AND ANAEROBIC 5CC  IMMUNE:COMPROMISED   Culture  Setup Time   Final    02/23/2014 00:32 Performed at [facility]    Culture   Final    PROTEUS  MIRABILIS Note: Gram Stain Report Called to,Read Back By and Verified With: CARDONE T. AT 1400 ON 02/22/14 BY ISLEY B. Performed at [facility] Performed at [facility]    Report Status 02/25/2014 FINAL  Final   Organism ID, Bacteria PROTEUS MIRABILIS  Final      Susceptibility   Proteus mirabilis - MIC*    AMPICILLIN <=2 SENSITIVE Sensitive     AMPICILLIN/SULBACTAM <=2 SENSITIVE Sensitive     CEFAZOLIN <=4 SENSITIVE Sensitive     CEFEPIME <=1 SENSITIVE Sensitive     CEFTAZIDIME <=1 SENSITIVE Sensitive     CEFTRIAXONE <=1 SENSITIVE Sensitive     CIPROFLOXACIN >=4 RESISTANT Resistant     GENTAMICIN >=16 RESISTANT Resistant     IMIPENEM 1 SENSITIVE Sensitive     PIP/TAZO <=4 SENSITIVE Sensitive     TOBRAMYCIN 8 INTERMEDIATE Intermediate     TRIMETH/SULFA >=320 RESISTANT Resistant     * PROTEUS MIRABILIS  Blood culture (routine x 2)     Status: None   Collection Time: 02/21/14 12:18 PM  Result Value Ref Range Status   Specimen Description BLOOD LEFT HAND  Final   Special Requests   Final    BOTTLES DRAWN AEROBIC AND ANAEROBIC 5CC  IMMUNE:COMPROMISED   Culture NO GROWTH 5 DAYS  Final   Report Status 02/26/2014 FINAL  Final  MRSA PCR Screening     Status: None   Collection Time: 02/21/14  9:58 PM  Result Value Ref Range Status   MRSA by PCR NEGATIVE NEGATIVE Final    Comment:        The GeneXpert MRSA Assay (FDA approved for NASAL specimens only), is one component of a comprehensive MRSA colonization surveillance program. It is not intended to diagnose MRSA infection nor to guide or monitor treatment for MRSA infections.   Urine culture     Status: None   Collection Time: 02/22/14 10:29 AM  Result Value Ref Range Status   Specimen Description URINE, CATHETERIZED  Final   Special Requests Normal  Final   Culture  Setup Time   Final    02/22/2014 17:17 Performed at [place] Performed at [facility]   Final    Culture NO  GROWTH Performed at [facility]   Final   Report Status 02/23/2014 FINAL  Final  Clostridium Difficile by PCR     Status: Abnormal   Collection Time: 02/26/14  3:56 PM  Result Value Ref Range Status   C difficile by pcr POSITIVE (A) NEGATIVE Final    Comment: CRITICAL RESULT CALLED TO, READ BACK BY AND VERIFIED WITH: Terrance Mass RN 02/27/14 0830 COSTELLO B      Studies:  Recent x-ray studies have been reviewed in detail by the Attending Physician  Time spent :  45 mins   Imogene Burn, [place] [facility] Pager: [phone]   On-Call/Text Page:      [email or web address]      password [facility]  02/28/2014, 12:21 PM   LOS: 7 days   Patient seen and examined. He is feeling ok, still with diarrhea. Multiple BM overnight.  Continue with vancomycin for c diff, voiding trial. Palliative care meeting 12-18./ likely SNF 12-18. Belkys Regalado, Md.

## 2014-02-28 NOTE — Progress Notes (Signed)
SLP Cancellation Note  Patient Details Name: AUDEN TATAR MRN: [id number] DOB: June 16, 1929   Cancelled treatment:       Reason Eval/Treat Not Completed: Patient declined, no reason specified. Pt refused to eat breakfast. Given pt poor participation with therapy and assessment showing no overt evidence of aspiration and report from staff that pt is tolerating diet, will sign off   Carnita Golob, Katherene Ponto 02/28/2014, 9:37 AM

## 2014-02-28 NOTE — Discharge Summary (Addendum)
Physician Discharge Summary  [name] WIO:[id number] DOB: 09/19/1929 DOA: 02/21/2014  PCP: [name], MD  Admit date: 02/21/2014 Discharge date: 03/01/2014  Time spent: 60 minutes  Recommendations for Outpatient Follow-up:   On meropenem and rifampin through December 24. Please pull PICC line on December 25.  Follow-up with Dr. Vista Deck of [facility] infectious disease in the next 2-3 weeks.  Continue Oral Vancomycin until seen by Dr. Orvan Falconer.  After meropenem and rifampin are completed, please restart oral Levaquin 750 mg daily due to pseudomonal AAA graft infection.  CBC, bmet on December 21 follow hemoglobin, platelets and electrolytes  If further hematuria or acute urinary retention please schedule to see [facility].   Ok to restart plavix and aspirin on 12/23.  Per Goals of Care Discussion:  He is a Do Not Intubate.    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 difficile diarrhea   Counseling regarding advanced directives and goals of care   Discharge Condition: Stable  Diet recommendation: Receives PEG tube feedings, free water per tube, and can have dysphagia 3 diet   History of present illness:  80 year old male patient with a very complex past medical history. In July 2015 (at [facility]) he underwent repair of a thoraco-abdominal aortic aneurysm but unfortunately this procedure was complicated by a Pseudomonas graft infection. He had a PICC line placed and received IV cefepime. He was dc'd to a nursing facility. He was dx'd with dysphagia and poor oral intake with severe DH during a hospitalization in early December of this year. A PEG tube was placed at that time.  He has a known left lower extremity DVT dating back to Feb 2015 but had to stop anticoagulation because of gross hematuria and subsequently an IVC filter was placed.   With regard to the Pseudomonas graft infection with bacteremia, this was initially diagnosed in October 2015 and was pansensitive. He has been followed by infectious disease service from [facility] since that time. His PICC line was removed on 12/9 and antibiotics were changed to Levaquin per PEG to be continued indefinitely.  He presented to [facility] on 12/10 with a temperature of 103.3. This was associated with hypotension. He was empirically started on vancomycin and cefepime. He was subsequently transferred to [facility] for further treatment.  Hospital Course:   Septic shock - resolved sepsis physiology has resolved   History of Bacteremia due to Pseudomonas Infected aortic graft. Now 1/2 blood cx + Proteus Mirabilis ID consulted - After reviewing both the previous blood cultures from [facility] (that showed pseudomonas) and the current blood cultures (that show proteus mirabilis) they recommended  meropenem and rifampin for a total of 14 days. Then resume previous pseudomonal coverage with Levaquin. CTA evaluation of chest and abdomen does not reveal any evidence of abscess  or infection involving the AAA graft. Gall Bladder u/s  negative for infection.    C-diff diarrhea Patient was discovered to be c-diff positive on 12/16 and was started on oral vancomycin per ID recommendation. Patient should remain on oral vanc  indefinitely until seen by his ID MD at [facility] (Dr. Vista Deck), as he will be on continuous levaquin after he finishes the primaxin and rifampin.   Left upper lobe infiltrate. HCAP Seen on CTA chest. Speech Therapy evaluated for possible aspiration. Current antibiotic therapy will cover.   Acute on chronic combined systolic (EF 17%) and grade 1 diastolic congestive heart failure Compensated    Hematuria in the setting of hx of prostate CA and radiation therapy. Appreciate urology consultation. Foley placed due to acute urinary retention.  He then required continuous bladder irrigation for 24 hours due to hematuria with clots.  His foley was discontinued on 12/17 and he continues to have appropriate urine output without gross hematuria.  Hold aspirin and plavix.  Plan to resume aspirin and plavix 12/23 after bleeding has ceased for 1 week. Patient required transfusion of 1 unit of PRBCs on 12/15.    Anemia with ABLA secondary to hematuria. Transfuse 1 unit of PRBCs 02/26/2014. Check CBC q 12 hours x 2.   HTN BP improved . Metoprolol resumed.    Thrombocytopenia Chronic thrombocytopenia but presenting levels were lower than his baseline likely due to gram-negative infection - plt count is now improving.  Please follow outpatient to ensure continued stability.   Stage III chronic kidney disease Baseline renal function BUN 26 creatinine 1.41 - creatinine now better than baseline (0.80) s/p volume resuscitation    History of DVT of lower extremity (Feb 2015) - post IVC filter no lower extremity edema   Protein-calorie malnutrition, severe PEG tube feeds to continue - lytes stable    Dysphagia/Status post insertion of PEG tube started on tube feedings during last admission; continue Jevity - able to tolerate oral intake for pleasure  Speech evaluation to define diet.    Palliative Medicine Appreciate Palliative Consultation. Son and daughters present.  Extensive  conversation.  Daughter [name] is realistic and seems to understands father's condition.   Family agreed to "DNI - Do not intubate".  Recommend further Goals of care discussions at SNF.   Procedures: PICC placement (12/17) Continuous Bladder Irrigation for Hematuria with clots.  Consultations: [facility] Palliative Medicine Infectious Disease   Discharge Exam: Filed Vitals:   02/28/14 2159 03/01/14 0547 03/01/14 0958 03/01/14 1345  BP: 140/74 129/69 141/69 122/85  Pulse:  87 66 82  Temp: 98.4 F (36.9 C) 98.4 F (36.9 C)  98.2 F (36.8 C)  TempSrc: Oral Oral  Oral  Resp: 13 17  20   Height:      Weight:  98.2 kg (216 lb 7.9 oz)    SpO2: 98% 96%  100%    Filed Weights   02/27/14 0500 02/28/14 0633 03/01/14 0547  Weight: 96.7 kg (213 lb 3 oz) 94.2 kg (207 lb 10.8 oz) 98.2 kg (216 lb 7.9 oz)    Gen: elderly male, appears chronically ill. calm, NAD, awakens easily from sleep. Chest: Good air movement with no frank w/c/r Cardiac: rrr, 3/6 systolic murmur, no lower extremity edema Abdomen: Soft nontender nondistended without hepatosplenomegaly, no ascites; PEG tube site unremarkable GU: Foley replaced by condom cath, urine is yellow in bag. (no hematuria) Extremities: no edema, 5/5 strength in each.   Discharge Instructions   Discharge Instructions  Diet general    Complete by:  As directed   Comfort feeds.  D3.     Increase activity slowly    Complete by:  As directed      Increase activity slowly    Complete by:  As directed           Current Discharge Medication List    START taking these medications   Details  albuterol (PROVENTIL) (2.5 MG/3ML) 0.083% nebulizer solution Take 3 mLs (2.5 mg total) by nebulization every 2 (two) hours as needed for wheezing or shortness of breath. Qty: 75 mL, Refills: 12    famotidine (PEPCID) 40 MG/5ML suspension Place 5 mLs (40 mg total) into feeding tube daily. Qty: 50 mL, Refills: 0    LORazepam (ATIVAN) 2  MG/ML concentrated solution Take 0.1 mLs (0.2 mg total) by mouth every 6 (six) hours as needed for anxiety. Qty: 30 mL, Refills: 0    meropenem 1 g in sodium chloride 0.9 % 100 mL Inject 1 g into the vein every 8 (eight) hours.    rifampin (RIFADIN) 300 MG capsule Take 1 capsule (300 mg total) by mouth every 12 (twelve) hours.    vancomycin (VANCOCIN) 50 mg/mL oral solution For C-Diff.  Continue indefinitely until patient is seen in follow up at [facility] Infectious Disease. (as the patient will be on continuous Levaquin).    Water For Irrigation, Sterile (FREE WATER) SOLN Place 100 mLs into feeding tube every 8 (eight) hours.      CONTINUE these medications which have CHANGED   Details  levofloxacin (LEVAQUIN) 750 MG tablet Please resume oral levaquin when IV Meropenem is complete on 12/25.  Continue indefintely for suppression of infection.  This medication will be managed by Infectious Disease at [facility].    methylphenidate (RITALIN) 5 MG tablet Take 1 tablet (5 mg total) by mouth daily. Qty: 30 tablet, Refills: 0    traMADol (ULTRAM) 50 MG tablet Take 1 tablet (50 mg total) by mouth every 6 (six) hours as needed for moderate pain. Qty: 60 tablet, Refills: 0      CONTINUE these medications which have NOT CHANGED   Details  Amino Acids-Protein Hydrolys (FEEDING SUPPLEMENT, PRO-STAT SUGAR FREE 64,) LIQD Place 30 mLs into feeding tube 3 (three) times daily. Qty: 900 mL, Refills: 0    doxazosin (CARDURA) 4 MG tablet Take 4 mg by mouth at bedtime.    metoprolol succinate (TOPROL-XL) 25 MG 24 hr tablet Take 25 mg by mouth daily.    mirtazapine (REMERON) 30 MG tablet Take 30 mg by mouth at bedtime.    Nutritional Supplements (FEEDING SUPPLEMENT, JEVITY 1.2 CAL,) LIQD Place 1,000 mLs into feeding tube continuous. Refills: 0    simvastatin (ZOCOR) 20 MG tablet Take 20 mg by mouth daily.    triamcinolone cream (KENALOG) 0.1 % Apply 1 application topically 2 (two) times daily as needed (rash).     acetaminophen (TYLENOL) 500 MG tablet Take 1,000 mg by mouth every 6 (six) hours as needed for mild pain or moderate pain.    docusate sodium (COLACE) 100 MG capsule Take 100 mg by mouth daily as needed for mild constipation.      STOP taking these medications     aspirin 81 MG chewable tablet      clopidogrel (PLAVIX) 75 MG tablet      omeprazole (PRILOSEC) 40 MG capsule      senna (SENOKOT) 8.6 MG TABS tablet      LORazepam (ATIVAN) 0.5 MG  tablet      polyethylene glycol (MIRALAX / GLYCOLAX) packet        Allergies  Allergen Reactions  . Atorvastatin Other (See Comments)    Causes constipation  . Penicillins Other (See Comments)    Other reaction(s): RASH HIVES       The results of significant diagnostics from this hospitalization (including imaging, microbiology, ancillary and laboratory) are listed below for reference.    Significant Diagnostic Studies: Ct Angio Abdomen W/cm &/or Wo Contrast  02/26/2014   CLINICAL DATA:  Thoracoabdominal aortic aneurysm post stenting, celiac axis pseudoaneurysm, graft became infected, septic shock due to recurrent graft infection (Pseudomonas  EXAM: CT ANGIOGRAPHY CHEST, ABDOMEN AND PELVIS  TECHNIQUE: Multidetector CT imaging through the chest, abdomen and pelvis was performed using the standard protocol during bolus administration of intravenous contrast. Multiplanar reconstructed images and MIPs were obtained and reviewed to evaluate the vascular anatomy.  CONTRAST:  174mL OMNIPAQUE IOHEXOL 350 MG/ML SOLN  COMPARISON:  CT abdomen and pelvis 01/11/2014  FINDINGS: CTA CHEST FINDINGS  Scattered atherosclerotic calcifications aorta, visualized great vessels and coronary arteries.  Aortic endoluminal stent from proximal descending thoracic aorta through aortic bifurcation.  Following contrast, normal enhancement of the aorta is identified.  No evidence of thoracic aneurysm or dissection.  Pulmonary arteries patent.  No thoracic adenopathy.   Scattered atelectasis in both lower lobes with infiltrate seen in the LEFT upper lobe.  Remaining RIGHT lung clear.  No pleural effusion or pneumothorax.  Review of the MIP images confirms the above findings.  CTA ABDOMEN AND PELVIS FINDINGS  Aortic stent extends from proximal descending thoracic aorta through abdominal aorta into BILATERAL common iliac arteries and into the RIGHT internal and external iliac arteries.  Aneurysmal dilatation of the RIGHT common iliac artery again identified 4.3 cm transverse.  Small distal abdominal aortic aneurysm again identified, 4.4 x 3.9 cm.  Proximal abdominal aortic aneurysm at aortic hiatus, 6.3 x 6.7 cm unchanged.  Stents identified at the renal arteries bilaterally, SMA and celiac artery.  Following contrast, no evidence of endoleak identified.  Interval decrease in size of pseudoaneurysm at the celiac axis with pseudoaneurysm no longer opacified by contrast.  Mild hazy infiltration of the fat surrounding the celiac access and pseudoaneurysm is unchanged.  Small amount of thrombus identified within proximal RIGHT common iliac artery stent, stable.  No infiltration of fat surrounding the aortic or common iliac artery aneurysms identified to suggest leak/rupture.  No abnormal fluid collections are seen to suggest abscess though infection of graft cannot be excluded by CT.  Large cyst inferior pole LEFT kidney 4.9 x 4.1 cm image 135.  Small amount of fluid adjacent to gallbladder, nonspecific cannot exclude cholecystitis with this appearance.  IVC filter noted.  PEG tube in stomach.  Descending and sigmoid colonic diverticulosis without evidence of diverticulitis.  Foley catheter and small amount of air within urinary bladder with question minimal bladder wall thickening anteriorly and intraluminal high attenuation material likely blood or debris.  Minimal prostate enlargement.  Remaining bowel loops unremarkable.  No additional mass, adenopathy, free fluid or free air.  Bones  demineralized.  Review of the MIP images confirms the above findings.  IMPRESSION: Stable sizes of thoracoabdominal, distal aortic and RIGHT common iliac artery aneurysms post endoluminal stenting.  No evidence of endoleak or adjacent abscess collection.  Interval additional stenting of the celiac artery with previously identified pseudoaneurysm no longer opacified and smaller in size.  Dissection seen at the common hepatic artery origin on  the previous study is not definitely visualized on the current study which demonstrates an interval stent.  Suspected blood or debris within urinary bladder, cannot exclude minimal anterior bladder wall thickening.  Colonic diverticulosis.  Patchy bibasilar atelectasis with mild LEFT upper lobe infiltrate suspicious for pneumonia.  Small amount of fluid adjacent to the gallbladder, cannot exclude cholecystitis; recommend clinical correlation and if indicated ultrasound imaging.   Electronically Signed   By: Lavonia Dana M.D.   On: 02/26/2014 13:38   Dg Fluoro Rm 1-60 Min  02/04/2014   CLINICAL DATA:  Nausea, question dysphagia  EXAM: FLOURO RM 1-60 MIN  TECHNIQUE: Attempted esophagram.  Patient unable to drink thin barium by straw.  With table elevated 40 degrees, patient unable to drink by cup.  Small amount of contrast was instilled into the oral cavity with a syringe.  CONTRAST:  Thin barium  FLUOROSCOPY TIME:  1 min 12 seconds  COMPARISON:  None  FINDINGS: With a small amount of contrast instilled into oral cavity by syringe, patient demonstrated prompt aspiration of a small amount of contrast. Spontaneous cough reflex identified. Patient was unable to swallow any contrast. Procedure terminated.  IMPRESSION: Aspiration of a small amount of contrast which was instilled into the oral cavity by syringe.  Spontaneous cough reflex noted.  Unable to perform esophagram.   Electronically Signed   By: Lavonia Dana M.D.   On: 02/04/2014 11:04   Dg Chest Port 1 View  (if Code Sepsis  Called)  02/21/2014   CLINICAL DATA:  80 year old male with fever of 103. PICC line removed yesterday. Current history of recurrent Pseudomonas bacteremia. Suspected sepsis. Initial encounter.  EXAM: PORTABLE CHEST - 1 VIEW  COMPARISON:  02/10/2014 and earlier.  FINDINGS: Portable AP upright view at 1137 hrs. Improved lung volumes. The patient is mildly rotated to the left. A chronic descending thoracic aorta endograft appears grossly stable. Stable cardiac size and mediastinal contours. No pneumothorax or pulmonary edema. Chronic hypo ventilation at the left lung base, Stable from recent comparisons. No definite effusion or consolidation. No acute osseous abnormality identified.  IMPRESSION: No acute cardiopulmonary abnormality. Chronic hypoventilation at the left lung base, stable from recent comparisons.   Electronically Signed   By: Lars Pinks M.D.   On: 02/21/2014 11:52   Dg Chest Port 1 View  02/10/2014   CLINICAL DATA:  Doppler of tube placement  EXAM: PORTABLE CHEST - 1 VIEW  COMPARISON:  02/08/2014  FINDINGS: Cardiomediastinal silhouette is stable. Aortic stent is unchanged in position. Stable right PICC line position. There is a NG feeding tube with tip in proximal stomach below the level of diaphragm.  IMPRESSION: NG feeding tube with tip in proximal stomach below of the level of diaphragm.   Electronically Signed   By: Lahoma Crocker M.D.   On: 02/10/2014 12:48   Dg Chest Port 1 View  02/08/2014   CLINICAL DATA:  Feeding tube placement.  EXAM: PORTABLE CHEST - 1 VIEW  COMPARISON:  02/06/2014, 01/11/2014, 07/27/2012.  FINDINGS: Nasogastric tube with tip below hemidiaphragms. Aortic stent graft. Cardiomegaly. Pulmonary vascularity is normal. No focal infiltrate. Mild basilar atelectasis stable. Elevation left hemidiaphragm. Mild right pleural effusion. No acute bony abnormality.  IMPRESSION: 1. Nasogastric tube with tip below hemidiaphragms. 2. Stable cardiomegaly.Aortic stent graft in stable  position. 3. Mild basilar atelectasis.  Small right pleural effusion.   Electronically Signed   By: Marcello Moores  Register   On: 02/08/2014 15:01   Dg Chest Port 1 View  02/06/2014  CLINICAL DATA:  Weakness.  EXAM: PORTABLE CHEST - 1 VIEW  COMPARISON:  January 11, 2014.  FINDINGS: Stable cardiomediastinal silhouette. Stent graft is again noted projected over descending thoracic aorta. No pneumothorax or significant pleural effusion is noted. Hypoinflation of the lungs is again noted. Right-sided PICC line is again noted and unchanged. Minimal subsegmental atelectasis is noted in left lung base. Right lung is clear. Bony thorax is intact.  IMPRESSION: Hypoinflation of the lungs is noted which is unchanged compared to prior exam. Minimal left basilar subsegmental atelectasis is noted.   Electronically Signed   By: Sabino Dick M.D.   On: 02/06/2014 15:15   Ct Angio Chest Aortic Dissect W &/or W/o  02/26/2014   CLINICAL DATA:  Thoracoabdominal aortic aneurysm post stenting, celiac axis pseudoaneurysm, graft became infected, septic shock due to recurrent graft infection (Pseudomonas  EXAM: CT ANGIOGRAPHY CHEST, ABDOMEN AND PELVIS  TECHNIQUE: Multidetector CT imaging through the chest, abdomen and pelvis was performed using the standard protocol during bolus administration of intravenous contrast. Multiplanar reconstructed images and MIPs were obtained and reviewed to evaluate the vascular anatomy.  CONTRAST:  116mL OMNIPAQUE IOHEXOL 350 MG/ML SOLN  COMPARISON:  CT abdomen and pelvis 01/11/2014  FINDINGS: CTA CHEST FINDINGS  Scattered atherosclerotic calcifications aorta, visualized great vessels and coronary arteries.  Aortic endoluminal stent from proximal descending thoracic aorta through aortic bifurcation.  Following contrast, normal enhancement of the aorta is identified.  No evidence of thoracic aneurysm or dissection.  Pulmonary arteries patent.  No thoracic adenopathy.  Scattered atelectasis in both lower  lobes with infiltrate seen in the LEFT upper lobe.  Remaining RIGHT lung clear.  No pleural effusion or pneumothorax.  Review of the MIP images confirms the above findings.  CTA ABDOMEN AND PELVIS FINDINGS  Aortic stent extends from proximal descending thoracic aorta through abdominal aorta into BILATERAL common iliac arteries and into the RIGHT internal and external iliac arteries.  Aneurysmal dilatation of the RIGHT common iliac artery again identified 4.3 cm transverse.  Small distal abdominal aortic aneurysm again identified, 4.4 x 3.9 cm.  Proximal abdominal aortic aneurysm at aortic hiatus, 6.3 x 6.7 cm unchanged.  Stents identified at the renal arteries bilaterally, SMA and celiac artery.  Following contrast, no evidence of endoleak identified.  Interval decrease in size of pseudoaneurysm at the celiac axis with pseudoaneurysm no longer opacified by contrast.  Mild hazy infiltration of the fat surrounding the celiac access and pseudoaneurysm is unchanged.  Small amount of thrombus identified within proximal RIGHT common iliac artery stent, stable.  No infiltration of fat surrounding the aortic or common iliac artery aneurysms identified to suggest leak/rupture.  No abnormal fluid collections are seen to suggest abscess though infection of graft cannot be excluded by CT.  Large cyst inferior pole LEFT kidney 4.9 x 4.1 cm image 135.  Small amount of fluid adjacent to gallbladder, nonspecific cannot exclude cholecystitis with this appearance.  IVC filter noted.  PEG tube in stomach.  Descending and sigmoid colonic diverticulosis without evidence of diverticulitis.  Foley catheter and small amount of air within urinary bladder with question minimal bladder wall thickening anteriorly and intraluminal high attenuation material likely blood or debris.  Minimal prostate enlargement.  Remaining bowel loops unremarkable.  No additional mass, adenopathy, free fluid or free air.  Bones demineralized.  Review of the MIP  images confirms the above findings.  IMPRESSION: Stable sizes of thoracoabdominal, distal aortic and RIGHT common iliac artery aneurysms post endoluminal stenting.  No evidence  of endoleak or adjacent abscess collection.  Interval additional stenting of the celiac artery with previously identified pseudoaneurysm no longer opacified and smaller in size.  Dissection seen at the common hepatic artery origin on the previous study is not definitely visualized on the current study which demonstrates an interval stent.  Suspected blood or debris within urinary bladder, cannot exclude minimal anterior bladder wall thickening.  Colonic diverticulosis.  Patchy bibasilar atelectasis with mild LEFT upper lobe infiltrate suspicious for pneumonia.  Small amount of fluid adjacent to the gallbladder, cannot exclude cholecystitis; recommend clinical correlation and if indicated ultrasound imaging.   Electronically Signed   By: Lavonia Dana M.D.   On: 02/26/2014 13:38   US Abdomen Limited Ruq  02/27/2014   CLINICAL DATA:  Pericholecystic fluid on CT, evaluate gallbladder  EXAM: US ABDOMEN LIMITED - RIGHT UPPER QUADRANT  COMPARISON:  CTA abdomen pelvis dated 02/26/2014  FINDINGS: Gallbladder:  No gallstones, gallbladder wall thickening, or pericholecystic fluid. Negative sonographic Murphy's sign.  Common bile duct:  Diameter: 4 mm.  Liver:  No focal lesion identified. At the upper limits of normal for parenchymal echogenicity.  IMPRESSION: Negative right upper quadrant ultrasound.   Electronically Signed   By: Julian Hy M.D.   On: 02/27/2014 22:06    Microbiology: Recent Results (from the past 240 hour(s))  Blood culture (routine x 2)     Status: None   Collection Time: 02/21/14 12:04 PM  Result Value Ref Range Status   Specimen Description BLOOD LEFT ANTECUBITAL  Final   Special Requests   Final    BOTTLES DRAWN AEROBIC AND ANAEROBIC 5CC  IMMUNE:COMPROMISED   Culture  Setup Time   Final    02/23/2014  00:32 Performed at [facility]    Culture   Final    PROTEUS MIRABILIS Note: Gram Stain Report Called to,Read Back By and Verified With: CARDONE T. AT 1400 ON 02/22/14 BY ISLEY B. Performed at [facility] Performed at [facility]    Report Status 02/25/2014 FINAL  Final   Organism ID, Bacteria PROTEUS MIRABILIS  Final      Susceptibility   Proteus mirabilis - MIC*    AMPICILLIN <=2 SENSITIVE Sensitive     AMPICILLIN/SULBACTAM <=2 SENSITIVE Sensitive     CEFAZOLIN <=4 SENSITIVE Sensitive     CEFEPIME <=1 SENSITIVE Sensitive     CEFTAZIDIME <=1 SENSITIVE Sensitive     CEFTRIAXONE <=1 SENSITIVE Sensitive     CIPROFLOXACIN >=4 RESISTANT Resistant     GENTAMICIN >=16 RESISTANT Resistant     IMIPENEM 1 SENSITIVE Sensitive     PIP/TAZO <=4 SENSITIVE Sensitive     TOBRAMYCIN 8 INTERMEDIATE Intermediate     TRIMETH/SULFA >=320 RESISTANT Resistant     * PROTEUS MIRABILIS  Blood culture (routine x 2)     Status: None   Collection Time: 02/21/14 12:18 PM  Result Value Ref Range Status   Specimen Description BLOOD LEFT HAND  Final   Special Requests   Final    BOTTLES DRAWN AEROBIC AND ANAEROBIC 5CC  IMMUNE:COMPROMISED   Culture NO GROWTH 5 DAYS  Final   Report Status 02/26/2014 FINAL  Final  MRSA PCR Screening     Status: None   Collection Time: 02/21/14  9:58 PM  Result Value Ref Range Status   MRSA by PCR NEGATIVE NEGATIVE Final    Comment:        The GeneXpert MRSA Assay (FDA approved for NASAL specimens only), is one component of  a comprehensive MRSA colonization surveillance program. It is not intended to diagnose MRSA infection nor to guide or monitor treatment for MRSA infections.   Urine culture     Status: None   Collection Time: 02/22/14 10:29 AM  Result Value Ref Range Status   Specimen Description URINE, CATHETERIZED  Final   Special Requests Normal  Final   Culture  Setup Time   Final    02/22/2014 17:17 Performed at [place] Performed at [facility]   Final   Culture NO GROWTH Performed at [facility]   Final   Report Status 02/23/2014 FINAL  Final  Clostridium Difficile by PCR     Status: Abnormal   Collection Time: 02/26/14  3:56 PM  Result Value Ref Range Status   C difficile by pcr POSITIVE (A) NEGATIVE Final    Comment: CRITICAL RESULT CALLED TO, READ BACK BY AND VERIFIED WITH: Terrance Mass RN 02/27/14 0830 COSTELLO B      Labs: Basic Metabolic Panel:  Recent Labs Lab 02/23/14 0049 02/24/14 0300 02/25/14 0434 02/27/14 0500 02/28/14 0934  NA 138 141 136* 139 136*  K 3.9 3.8 4.1 4.1 4.0  CL 107 107 102 103 103  CO2 19 23 23 23 25   GLUCOSE 107* 109* 98 105* 118*  BUN 31* 23 21 24* 24*  CREATININE 1.10 0.86 0.88 0.84 0.80  CALCIUM 8.6 8.7 8.6 8.8 8.8  MG 1.8  --   --   --   --   PHOS 2.4  --   --   --   --    Liver Function Tests:  Recent Labs Lab 02/23/14 0049 02/24/14 0300  AST 24 24  ALT 13 12  ALKPHOS 52 66  BILITOT 0.4 0.6  PROT 5.4* 5.5*  ALBUMIN 1.7* 1.8*   CBC:  Recent Labs Lab 02/25/14 0434 02/25/14 1147 02/25/14 1843 02/26/14 0541 02/27/14 0500 02/28/14 0934  WBC 4.5 3.9*  --  4.8 5.5 4.6  NEUTROABS  --   --   --   --   --  2.7  HGB 7.7* 7.8* 8.2* 7.1* 8.2* 8.0*  HCT 23.3* 23.2* 24.4* 21.8* 25.1* 24.7*  MCV 80.9 81.1  --  79.3 81.5 82.9  PLT 87* 87*  --  84* 87* 103*       Signed:  Melton Alar, PA-C  [facility] 03/01/2014, 9:10 PM    Agree with Above Discharge summary. Patient seen and examined.  Discharge on oral vancomycin and IV meropenem.  Needs to follow up with Infectious Diseases MD at [facility].

## 2014-02-28 NOTE — Progress Notes (Signed)
Peripherally Inserted Central Catheter/Midline Placement  The IV Nurse has discussed with the patient and/or persons authorized to consent for the patient, the purpose of this procedure and the potential benefits and risks involved with this procedure.  The benefits include less needle sticks, lab draws from the catheter and patient may be discharged home with the catheter.  Risks include, but not limited to, infection, bleeding, blood clot (thrombus formation), and puncture of an artery; nerve damage and irregular heat beat.  Alternatives to this procedure were also discussed.  PICC/Midline Placement Documentation  PICC / Midline Single Lumen 12/20/13 PICC Right Brachial (Active)       Henderson [place] 02/28/2014, 9:10 AM Consent from daughter Gonzella Lex via phone.

## 2014-02-28 NOTE — Progress Notes (Signed)
Arrived to insert a PICC however pt became combative.   Swinging arms and jerking covers over him.   I had to move quickly out of the way in order not to be hit.  He kept yelling,  "Get out of my room".   "I don't care if you talked with my daughter get out of my room".   "I'm not having that done"   "Turn that light out and get out of here".   I was not able to reason with him or get his cooperation.   Primary RN, Denyse Amass, made aware.

## 2014-03-01 ENCOUNTER — Ambulatory Visit: Payer: Medicare Other | Admitting: Urology

## 2014-03-01 ENCOUNTER — Other Ambulatory Visit: Payer: Self-pay | Admitting: *Deleted

## 2014-03-01 DIAGNOSIS — Z7189 Other specified counseling: Secondary | ICD-10-CM

## 2014-03-01 MED ORDER — METHYLPHENIDATE HCL 5 MG PO TABS: 5.0000 mg | ORAL_TABLET | Freq: Every day | ORAL | Status: DC

## 2014-03-01 MED ORDER — HEPARIN SOD (PORK) LOCK FLUSH 100 UNIT/ML IV SOLN
250.0000 [IU] | INTRAVENOUS | Status: AC | PRN
  Administered 2014-03-01: 250 [IU]

## 2014-03-01 MED ORDER — IMIPENEM-CILASTATIN 500 MG IV SOLR: 500.0000 mg | Freq: Three times a day (TID) | INTRAVENOUS | Status: DC

## 2014-03-01 MED ORDER — FAMOTIDINE 40 MG/5ML PO SUSR: 40.0000 mg | Freq: Every day | ORAL | Status: DC

## 2014-03-01 MED ORDER — SODIUM CHLORIDE 0.9 % IV SOLN: 1.0000 g | Freq: Three times a day (TID) | INTRAVENOUS | Status: AC

## 2014-03-01 NOTE — Clinical Social Work Note (Signed)
CSW received call from [facility] admissions coordinator at 4:03PM stating that the facility's pharmacy does not have access to meropenem. CSW provided [facility] [place] with two alternative medications, both of which facility stated they could not get. CSW collaborated with pharmacy to get a supply of meropenem to last patient through Monday evening. [facility] states that they are fine with this and will accept the bill from [facility] pharmacy. [facility] understands that they will be responsible for getting the remaining doses of meropenem from [facility] pharmacy or their own pharmacy to continue patient's treatment. CSW has provided signed prescription for meropenem to ground floor pharmacy. CSW has asked that RN call for EMS transport [phone] option 1, option 3) once supply of meropenem is received from pharmacy. Admissions coordinator Wray Kearns informed patient's daughters (currently at facility with Suanne Marker) that patient is still being discharged and will be transported to [facility] once supply of meropenem has been prepared. Addended DC summary shows that patient would be on primaxin (suggested alternative medication), but facility is aware that patient will be receiving meropenem and they have original DC summary stating this. DC packet has been prepared along with number for report. CSW signing off at this time.   Liz Beach MSW, [place], [place], [phone]

## 2014-03-01 NOTE — Clinical Social Work Placement (Signed)
Clinical Social Work Department CLINICAL SOCIAL WORK PLACEMENT NOTE 03/01/2014  Patient:  [name], [name]  Account Number:  [id number] Admit date:  02/21/2014  Clinical Social Worker:  Kemper Durie, [place]  Date/time:  02/27/2014 03:00 PM  Clinical Social Work is seeking post-discharge placement for this patient at the following level of care:   [place]   (*CSW will update this form in Epic as items are completed)   02/27/2014  Patient/family provided with [facility] Department of Clinical Social Work's list of facilities offering this level of care within the geographic area requested by the patient (or if unable, by the patient's family).  02/27/2014  Patient/family informed of their freedom to choose among providers that offer the needed level of care, that participate in Medicare, Medicaid or managed care program needed by the patient, have an available bed and are willing to accept the patient.  02/27/2014  Patient/family informed of [facility]' ownership interest in [facility], as well as of the fact that they are under no obligation to receive care at this facility.  PASARR submitted to EDS on  PASARR number received on   FL2 transmitted to all facilities in geographic area requested by pt/family on  02/27/2014 FL2 transmitted to all facilities within larger geographic area on 02/27/2014  Patient informed that his/her managed care company has contracts with or will negotiate with  certain facilities, including the following:     Patient/family informed of bed offers received:  02/27/2014 Patient chooses bed at [place] Physician recommends and patient chooses bed at    Patient to be transferred to [place] on  03/01/2014 Patient to be transferred to facility by [facility] Patient and family notified of transfer on 03/01/2014 Name of family member notified:  [name]  The following physician  request were entered in Epic:   Additional Comments:   Family questioned whether patient could go to [facility] [place] or not. RNCM provided quick rule out on this disposition as facility has no beds available. Per MD patient ready for DC to [facility]. RN, patient, patient's family, and facility notified of DC. RN given number for report. DC packet on chart. RN will request transport for patient. CSW signing off.   Liz Beach MSW, [place], [place], [phone]

## 2014-03-01 NOTE — Telephone Encounter (Signed)
[facility] 

## 2014-03-01 NOTE — Progress Notes (Signed)
Progress Note from the Palliative Medicine Team at [place]:  -patient is alert and oreitend X3, weak and lethargic  -family at bedside, (including three daughters and son), continued discussion regarding diagnosis, prognosis, GOC, disposition and options  -discussed concept of mortality, limitations of medicine and natural trajectory and expectations at EOL  -discussed importance of open communication regarding intention of all medical interventions and clear understanding of patient's wishes in order to enhance patient centered care    Objective: Allergies  Allergen Reactions  . Atorvastatin Other (See Comments)    Causes constipation  . Penicillins Other (See Comments)    Other reaction(s): RASH HIVES    Scheduled Meds: . doxazosin  4 mg Per Tube QHS  . free water  100 mL Per Tube 3 times per day  . meropenem (MERREM) IV  1 g Intravenous 3 times per day  . methylphenidate  5 mg Per Tube Daily  . metoprolol tartrate  25 mg Per Tube BID  . mirtazapine  30 mg Per Tube QHS  . polyethylene glycol  17 g Per Tube Daily  . rifampin  300 mg Oral Q12H  . vancomycin  125 mg Oral QID   Continuous Infusions: . sodium chloride 10 mL/hr at 02/24/14 1444  . feeding supplement (JEVITY 1.2 CAL) 1,000 mL (03/01/14 0838)   PRN Meds:.acetaminophen (TYLENOL) oral liquid 160 mg/5 mL, albuterol, LORazepam, morphine injection, ondansetron **OR** ondansetron (ZOFRAN) IV, sodium chloride  BP 122/85 mmHg  Pulse 82  Temp(Src) 98.2 F (36.8 C) (Oral)  Resp 20  Ht 6\' 2"  (1.88 m)  Wt 98.2 kg (216 lb 7.9 oz)  BMI 27.78 kg/m2  SpO2 100%   PPS:30 % at best     Intake/Output Summary (Last 24 hours) at 03/01/14 1644 Last data filed at 03/01/14 1349  Gross per 24 hour  Intake    938 ml  Output   1050 ml  Net   -112 ml       Physical Exam:  General: Chronically ill appearing, NAD HEENT:   Moist buccal membranes  Chest:   decreased in bases, CTA CVS: RRR Abdomen: soft NT  +BS Ext: without edema Psych: tearful at times during conversation  Labs: CBC    Component Value Date/Time   WBC 4.6 02/28/2014 0934   WBC 5.8 12/26/2013 1105   WBC 5.4 02/28/2007 0919   RBC 2.98* 02/28/2014 0934   RBC 3.62* 02/21/2014 1127   RBC 4.10* 12/26/2013 1105   RBC 5.21 02/28/2007 0919   HGB 8.0* 02/28/2014 0934   HGB 11.0* 12/26/2013 1105   HGB 15.0 02/28/2007 0919   HCT 24.7* 02/28/2014 0934   HCT 34.5* 12/26/2013 1105   HCT 44.0 02/28/2007 0919   PLT 103* 02/28/2014 0934   PLT 157 12/26/2013 1105   PLT 153 02/28/2007 0919   MCV 82.9 02/28/2014 0934   MCV 84 12/26/2013 1105   MCV 84.4 02/28/2007 0919   MCH 26.8 02/28/2014 0934   MCH 26.8* 12/26/2013 1105   MCH 28.7 02/28/2007 0919   MCHC 32.4 02/28/2014 0934   MCHC 31.9* 12/26/2013 1105   MCHC 34.0 02/28/2007 0919   RDW 19.8* 02/28/2014 0934   RDW 17.5* 12/26/2013 1105   RDW 14.9* 02/28/2007 0919   LYMPHSABS 1.3 02/28/2014 0934   LYMPHSABS 0.9 12/26/2013 1105   LYMPHSABS 1.6 02/28/2007 0919   MONOABS 0.6 02/28/2014 0934   MONOABS 0.6 02/28/2007 0919   EOSABS 0.0 02/28/2014 0934   EOSABS 0.1 12/26/2013 1105  EOSABS 0.3 02/28/2007 0919   BASOSABS 0.0 02/28/2014 0934   BASOSABS 0.0 12/26/2013 1105   BASOSABS 0.0 02/28/2007 0919    BMET    Component Value Date/Time   NA 136* 02/28/2014 0934   NA 140 10/31/2013 0839   K 4.0 02/28/2014 0934   K 3.9 10/31/2013 0839   CL 103 02/28/2014 0934   CL 103 10/31/2013 0839   CO2 25 02/28/2014 0934   CO2 27 10/31/2013 0839   GLUCOSE 118* 02/28/2014 0934   GLUCOSE 93 10/31/2013 0839   BUN 24* 02/28/2014 0934   BUN 17 10/31/2013 0839   CREATININE 0.80 02/28/2014 0934   CREATININE 1.1 10/31/2013 0839   CALCIUM 8.8 02/28/2014 0934   CALCIUM 8.9 10/31/2013 0839   GFRNONAA 81* 02/28/2014 0934   GFRAA >90 02/28/2014 0934    CMP     Component Value Date/Time   NA 136* 02/28/2014 0934   NA 140 10/31/2013 0839   K 4.0 02/28/2014 0934   K 3.9 10/31/2013  0839   CL 103 02/28/2014 0934   CL 103 10/31/2013 0839   CO2 25 02/28/2014 0934   CO2 27 10/31/2013 0839   GLUCOSE 118* 02/28/2014 0934   GLUCOSE 93 10/31/2013 0839   BUN 24* 02/28/2014 0934   BUN 17 10/31/2013 0839   CREATININE 0.80 02/28/2014 0934   CREATININE 1.1 10/31/2013 0839   CALCIUM 8.8 02/28/2014 0934   CALCIUM 8.9 10/31/2013 0839   PROT 5.5* 02/24/2014 0300   ALBUMIN 1.8* 02/24/2014 0300   AST 24 02/24/2014 0300   ALT 12 02/24/2014 0300   ALKPHOS 66 02/24/2014 0300   BILITOT 0.6 02/24/2014 0300   GFRNONAA 81* 02/28/2014 0934   GFRAA >90 02/28/2014 0934    Assessment and Plan:  Patient is open to all available and offered medical interventions to prolong life at this time.  To discharge to SNF for rehabilitation.   Time In Time Out Total Time Spent with Patient Total Overall Time  1200 1330 80 min 90 min    Greater than 50%  of this time was spent counseling and coordinating care related to the above assessment and plan.  Wadie Lessen NP  Palliative Medicine Team Team Phone # [phone] Pager [phone]  Discussed with Dr Tyrell Antonio   1

## 2014-03-01 NOTE — Progress Notes (Signed)
Mr Swindle has been sent to the nursing facility via ambulance.  His belongings are with him including 10 doses of antibiotic from our pharmacy.  Feeding tube and picc line are capped and flushed.  His daughter is aware of the move and has been updated about the late departure.  Vitals are stable and he is pain free.  Report was called to [facility] earlier in the day by Evelena Peat, the daytime RN.

## 2014-03-01 NOTE — Progress Notes (Signed)
PT Cancellation Note  Patient Details Name: [name] MRN: [id number] DOB: 29-May-1929   Cancelled Treatment:    Reason Eval/Treat Not Completed: Other (comment) (pt is imminently heading out to SNF rehab.  Will not be able to see him before d/c from the hospital.   03/01/2014  Donnella Sham, PT [phone]  (pager)   Sherrina Zaugg, Tessie Fass 03/01/2014, 5:43 PM

## 2014-03-01 NOTE — Progress Notes (Signed)
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  admit- lives with spouse.     Action/Plan:   Anticipated DC Date:  03/01/2014   Anticipated DC Plan:  SKILLED NURSING FACILITY  In-house referral  Clinical Social Worker      DC Planning Services  CM consult      Choice offered to / List presented to:     DME arranged  HOSPITAL BED      DME agency  [place] arranged  HH-1 RN  [place]      [name].   Status of service:  In process, will continue to follow Medicare Important Message given?  YES (If response is "NO", the following Medicare IM given date fields will be blank) Date Medicare IM given:  02/25/2014 Medicare IM given by:  Tomi Bamberger Date Additional Medicare IM given:  02/28/2014 Additional Medicare IM given by:  Tomi Bamberger  Discharge Disposition:  [place]  Per UR Regulation:  Reviewed for med. necessity/level of care/duration of stay  If discussed at [place] of Stay Meetings, dates discussed:    Comments:  02/28/14 [name], BSN [phone] NCM never received call back from daughter, [name].  Per CSW   she has made decision to go to snf. Patient is having dirrhea today and will ned voiding trial per MD,plan for snf tomorrow.  02/27/14 [place] ,BSN [phone] patient lives with spouse.  NCM received referral today for [facility] services for [facility], PT, OT, SW, Resp Care and ST.  NCM notifed [facility] of the services needed, patient is [place].  NCM spoke with [name] the daughter over the phone and she states she needs to talk to patient and other family members about disposition, she is not sure  they want him to come home because of the  Bacteremia and Cdiff ,  NCM asked about the snf, she stated they were not sure, NCM informed her that patient will most likely be ready for dc tomorrow. Daugh[place]ter states she will call me back after she talks with her family members.

## 2014-03-01 NOTE — Progress Notes (Signed)
Pt prepared for d/c to SNF. IV d/c'd. Skin intact except as most recently charted. Vitals are stable. Report called to receiving facility. Pt to be transported by ambulance service. 

## 2014-03-04 ENCOUNTER — Non-Acute Institutional Stay (SKILLED_NURSING_FACILITY): Payer: Medicare Other | Admitting: Internal Medicine

## 2014-03-04 DIAGNOSIS — J189 Pneumonia, unspecified organism: Secondary | ICD-10-CM

## 2014-03-04 DIAGNOSIS — N183 Chronic kidney disease, stage 3 unspecified: Secondary | ICD-10-CM

## 2014-03-04 DIAGNOSIS — D696 Thrombocytopenia, unspecified: Secondary | ICD-10-CM

## 2014-03-04 DIAGNOSIS — Z515 Encounter for palliative care: Secondary | ICD-10-CM

## 2014-03-04 DIAGNOSIS — R7881 Bacteremia: Secondary | ICD-10-CM

## 2014-03-04 DIAGNOSIS — R319 Hematuria, unspecified: Secondary | ICD-10-CM

## 2014-03-04 DIAGNOSIS — B965 Pseudomonas (aeruginosa) (mallei) (pseudomallei) as the cause of diseases classified elsewhere: Secondary | ICD-10-CM

## 2014-03-04 DIAGNOSIS — I5043 Acute on chronic combined systolic (congestive) and diastolic (congestive) heart failure: Secondary | ICD-10-CM

## 2014-03-04 DIAGNOSIS — R1310 Dysphagia, unspecified: Secondary | ICD-10-CM

## 2014-03-04 DIAGNOSIS — A498 Other bacterial infections of unspecified site: Secondary | ICD-10-CM

## 2014-03-04 DIAGNOSIS — A047 Enterocolitis due to Clostridium difficile: Secondary | ICD-10-CM

## 2014-03-04 DIAGNOSIS — Z86718 Personal history of other venous thrombosis and embolism: Secondary | ICD-10-CM

## 2014-03-04 DIAGNOSIS — B964 Proteus (mirabilis) (morganii) as the cause of diseases classified elsewhere: Secondary | ICD-10-CM

## 2014-03-04 DIAGNOSIS — E43 Unspecified severe protein-calorie malnutrition: Secondary | ICD-10-CM

## 2014-03-04 DIAGNOSIS — A0472 Enterocolitis due to Clostridium difficile, not specified as recurrent: Secondary | ICD-10-CM

## 2014-03-04 DIAGNOSIS — D62 Acute posthemorrhagic anemia: Secondary | ICD-10-CM

## 2014-03-04 NOTE — Progress Notes (Signed)
MRN: [id number] Name: [name]  Sex: male Age: 80 y.o. DOB: 30-Jul-1929  [place] #: Helene Kelp Facility/Room: 220 Level Of Care: SNF Provider: Inocencio Homes D Emergency Contacts: Extended Emergency Contact Information Primary Emergency Contact: Thacker,Brenda Address: [address]          [place], [place] [address] Johnnette Litter of [place] Phone: [phone] Relation: Daughter Secondary Emergency Contact: Titus Dubin, [place] [address] Johnnette Litter of [place] Phone: [phone] Mobile Phone: [phone] Relation: Daughter  Code Status:DNR at hospital   Allergies: Atorvastatin and Penicillins  Chief Complaint  Patient presents with  . New Admit To SNF    HPI: Patient is 80 y.o. 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   Past Medical History  Diagnosis Date  . Hypertension   . BPH (benign prostatic hyperplasia)   . Hyperlipidemia   . Cancer 2008-2009    prostate TREATED WITH RADIATION  . AAA (abdominal aortic aneurysm) 04/2012    STENTING OF AAA IN [place]  . Junctional cardiac arrhythmia     Occurred postoperatively after urologic surgery  . Severe sinus bradycardia     Occurred postoperatively after urologic surgery  . Aortic valve disorders   . Other primary cardiomyopathies   . Dysphagia   . Status post insertion of percutaneous endoscopic gastrostomy (PEG) tube 02/11/14  . Bacteremia due to Pseudomonas   . Infected aortic graft     Past Surgical History  Procedure Laterality Date  . Abdominal surgery      PART OF COLON REMOVED FOR DIVERTICULITIS  . Appendectomy    . Hernia repair    . Bladder surgery  2008    FOR BLADDER STONE  . Total knee arthroplasty  1990    left  . Abdominal aortic aneurysm repair  04/2012  . Total knee arthroplasty Right 07/26/2012    Procedure: RIGHT TOTAL KNEE  ARTHROPLASTY;  Surgeon: Tobi Bastos, MD;  Location: [facility];  Service: Orthopedics;  Laterality: Right;  . Nephrolithotomy Left 04/26/2013    Procedure: LEFT PERCUTANEOUS NEPHROLITHOTOMY ;  Surgeon: [name], MD;  Location: [facility];  Service: Urology;  Laterality: Left;  . Nephrolithotomy Left 05/03/2013    Procedure: 2ND STAGE LEFT PERCUTANEOUS NEPHROLITHOTOMY ;  Surgeon: [name], MD;  Location: [facility];  Service: Urology;  Laterality: Left;  . Esophagogastroduodenoscopy N/A 02/11/2014    YQI:HKVQQ hiatal hernia. Patchy erythema of the gastric mucosa; otherwise negative EGD. Status post 31 French Microvasive PEG tube placement  . Peg placement N/A 02/11/2014    Procedure: PERCUTANEOUS ENDOSCOPIC GASTROSTOMY (PEG) PLACEMENT;  Surgeon: Daneil Dolin, MD;  Location: [facility];  Service: Endoscopy;  Laterality: N/A;      Medication List       This list is accurate as of: 03/04/14 11:59 PM.  Always use your most recent med list.               acetaminophen 500 MG tablet  Commonly known as:  TYLENOL  Take 1,000 mg by mouth every 6 (six) hours as needed for mild pain or moderate pain.     albuterol (2.5 MG/3ML) 0.083% nebulizer solution  Commonly known as:  PROVENTIL  Take 3 mLs (2.5 mg total) by nebulization every 2 (two) hours as needed for wheezing or shortness of breath.  docusate sodium 100 MG capsule  Commonly known as:  COLACE  Take 100 mg by mouth daily as needed for mild constipation.     doxazosin 4 MG tablet  Commonly known as:  CARDURA  Take 4 mg by mouth at bedtime.     famotidine 40 MG/5ML suspension  Commonly known as:  PEPCID  Place 5 mLs (40 mg total) into feeding tube daily.     feeding supplement (JEVITY 1.2 CAL) Liqd  Place 1,000 mLs into feeding tube continuous.     feeding supplement (PRO-STAT SUGAR FREE 64) Liqd  Place 30 mLs into feeding tube 3 (three) times daily.     free water Soln  Place 100 mLs into feeding tube every 8 (eight)  hours.     levofloxacin 750 MG tablet  Commonly known as:  LEVAQUIN  Please resume oral levaquin when IV Meropenem is complete on 12/25.  Continue indefintely for suppression of infection.  This medication will be managed by Infectious Disease at [facility].     LORazepam 2 MG/ML concentrated solution  Commonly known as:  ATIVAN  Take 0.1 mLs (0.2 mg total) by mouth every 6 (six) hours as needed for anxiety.     meropenem 1 g in sodium chloride 0.9 % 100 mL  Inject 1 g into the vein every 8 (eight) hours.     meropenem 1 g in sodium chloride 0.9 % 100 mL  Inject 1 g into the vein every 8 (eight) hours.     meropenem 1 G injection  Commonly known as:  MERREM  Inject 1 g into the muscle every 8 (eight) hours.     methylphenidate 5 MG tablet  Commonly known as:  RITALIN  Take 1 tablet (5 mg total) by mouth daily.     metoprolol succinate 25 MG 24 hr tablet  Commonly known as:  TOPROL-XL  Take 25 mg by mouth daily.     mirtazapine 30 MG tablet  Commonly known as:  REMERON  Take 30 mg by mouth at bedtime.     rifampin 300 MG capsule  Commonly known as:  RIFADIN  Take 300 mg by mouth every 12 (twelve) hours.     rifampin 300 MG capsule  Commonly known as:  RIFADIN  Take 1 capsule (300 mg total) by mouth every 12 (twelve) hours.     simvastatin 20 MG tablet  Commonly known as:  ZOCOR  Take 20 mg by mouth daily.     traMADol 50 MG tablet  Commonly known as:  ULTRAM  Take 1 tablet (50 mg total) by mouth every 6 (six) hours as needed for moderate pain.     triamcinolone cream 0.1 %  Commonly known as:  KENALOG  Apply 1 application topically 2 (two) times daily as needed (rash).     vancomycin 50 mg/mL oral solution  Commonly known as:  VANCOCIN  For [employer].  Continue indefinitely until patient is seen in follow up at [facility] Infectious Disease. (as the patient will be on continuous Levaquin).        Meds ordered this encounter  Medications  . meropenem (MERREM) 1 G injection     Sig: Inject 1 g into the muscle every 8 (eight) hours.  . meropenem 1 g in sodium chloride 0.9 % 100 mL    Sig: Inject 1 g into the vein every 8 (eight) hours.  . rifampin (RIFADIN) 300 MG capsule    Sig: Take 300 mg by mouth every 12 (twelve)  hours.     There is no immunization history on file for this patient.  History  Substance Use Topics  . Smoking status: Former Smoker -- 2.00 packs/day for 37 years    Types: Cigarettes    Start date: 02/17/1949    Quit date: 03/15/1985  . Smokeless tobacco: Never Used     Comment: quit 25 years ago  . Alcohol Use: No    Family history is noncontributory    Review of Systems  DATA OBTAINED: from patient GENERAL:  no fevers, fatigue, appetite changes SKIN: No itching, rash  EYES: No eye pain, redness, discharge EARS: No earache, tinnitus, change in hearing NOSE: No congestion, drainage or bleeding  MOUTH/THROAT: No mouth or tooth pain, No sore throat RESPIRATORY: No cough, wheezing, SOB CARDIAC: No chest pain, palpitations, lower extremity edema  GI: No abdominal pain, No N/V/D or constipation, No heartburn or reflux  GU: No dysuria, frequency or urgency, or incontinence  MUSCULOSKELETAL: No unrelieved bone/joint pain NEUROLOGIC: No headache, dizziness  PSYCHIATRIC: No overt anxiety or sadness, No behavior issue.   Filed Vitals:   03/04/14 1448  BP: 148/84  Pulse: 84  Temp: 97.6 F (36.4 C)  Resp: 18    Physical Exam  GENERAL APPEARANCE: Awakened easily from sleep, conversant,  No acute distress, chronically ill appearing SKIN: No diaphoresis rash HEAD: Normocephalic, atraumatic  EYES: Conjunctiva/lids clear. Pupils round, reactive. EOMs intact.  EARS: External exam WNL, canals clear. Hearing grossly normal.  NOSE: No deformity or discharge.  MOUTH/THROAT: Lips w/o lesions  RESPIRATORY: Breathing is even, unlabored. Lung sounds are clear   CARDIOVASCULAR: Heart RRR no murmurs, rubs or gallops. No peripheral edema.    GASTROINTESTINAL: Abdomen is soft, non-tender, not distended w/ normal bowel sounds, PEG on place GENITOURINARY: Bladder non tender, not distended,foley without blood MUSCULOSKELETAL: No abnormal joints or musculature NEUROLOGIC:  Cranial nerves 2-12 grossly intact  PSYCHIATRIC: Mood and affect appropriate to situation, no behavioral issues  Patient Active Problem List   Diagnosis Date Noted  . Hematuria 03/10/2014  . Acute blood loss anemia 03/10/2014  . Counseling regarding advanced directives and goals of care 03/01/2014  . C. difficile diarrhea 02/27/2014  . HCAP (healthcare-associated pneumonia) 02/26/2014  . AAA (abdominal aortic aneurysm)   . Bacteremia   . Proteus infection   . Sepsis 02/24/2014  . Fever presenting with conditions classified elsewhere 02/24/2014  . Palliative care encounter   . Acute on chronic combined systolic (EF 78%) and grade 1 diastolic congestive heart failure 02/22/2014  . Recurrent bacteremia 02/22/2014  . CKD (chronic kidney disease), stage III 02/22/2014  . Septic shock 02/21/2014  . History of Bacteremia due to Pseudomonas 02/21/2014  . Infected aortic graft 02/21/2014  . Stage III chronic kidney disease 02/21/2014  . Dysphagia 02/21/2014  . Status post insertion of percutaneous endoscopic gastrostomy (PEG) tube 02/21/2014  . Hypokalemia 02/10/2014  . Protein-calorie malnutrition, severe 02/08/2014  . Rash and nonspecific skin eruption 02/02/2014  . Supratherapeutic INR 05/17/2013  . Anticoagulation management encounter 05/15/2013  . Aortic valve disorders   . Other primary cardiomyopathies   . 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 Thrombocytopenia 04/30/2013  . Renal stone 04/25/2013  . Osteoarthritis of right knee 07/26/2012  . Weakness of right leg 05/19/2012  . HTN (hypertension) 05/19/2012    CBC    Component Value Date/Time   WBC 4.6 02/28/2014 0934   WBC 5.8 12/26/2013 1105   WBC 5.4  02/28/2007 0919   RBC 2.98* 02/28/2014 0934   RBC 3.62* 02/21/2014 1127   RBC 4.10* 12/26/2013 1105   RBC 5.21 02/28/2007 0919   HGB 8.0* 02/28/2014 0934   HGB 11.0* 12/26/2013 1105   HGB 15.0 02/28/2007 0919   HCT 24.7* 02/28/2014 0934   HCT 34.5* 12/26/2013 1105   HCT 44.0 02/28/2007 0919   PLT 103* 02/28/2014 0934   PLT 157 12/26/2013 1105   PLT 153 02/28/2007 0919   MCV 82.9 02/28/2014 0934   MCV 84 12/26/2013 1105   MCV 84.4 02/28/2007 0919   LYMPHSABS 1.3 02/28/2014 0934   LYMPHSABS 0.9 12/26/2013 1105   LYMPHSABS 1.6 02/28/2007 0919   MONOABS 0.6 02/28/2014 0934   MONOABS 0.6 02/28/2007 0919   EOSABS 0.0 02/28/2014 0934   EOSABS 0.1 12/26/2013 1105   EOSABS 0.3 02/28/2007 0919   BASOSABS 0.0 02/28/2014 0934   BASOSABS 0.0 12/26/2013 1105   BASOSABS 0.0 02/28/2007 0919    CMP     Component Value Date/Time   NA 136* 02/28/2014 0934   NA 140 10/31/2013 0839   K 4.0 02/28/2014 0934   K 3.9 10/31/2013 0839   CL 103 02/28/2014 0934   CL 103 10/31/2013 0839   CO2 25 02/28/2014 0934   CO2 27 10/31/2013 0839   GLUCOSE 118* 02/28/2014 0934   GLUCOSE 93 10/31/2013 0839   BUN 24* 02/28/2014 0934   BUN 17 10/31/2013 0839   CREATININE 0.80 02/28/2014 0934   CREATININE 1.1 10/31/2013 0839   CALCIUM 8.8 02/28/2014 0934   CALCIUM 8.9 10/31/2013 0839   PROT 5.5* 02/24/2014 0300   ALBUMIN 1.8* 02/24/2014 0300   AST 24 02/24/2014 0300   ALT 12 02/24/2014 0300   ALKPHOS 66 02/24/2014 0300   BILITOT 0.6 02/24/2014 0300   GFRNONAA 81* 02/28/2014 0934   GFRAA >90 02/28/2014 0934    Assessment and Plan  History of Bacteremia due to Pseudomonas due to Pseudomonas Infected aortic graft. Now 1/2 blood cx + Proteus Mirabilis ID consulted - After reviewing both the previous blood cultures from [facility] (that showed pseudomonas) and the current blood cultures (that show proteus mirabilis) they recommended meropenem and rifampin for a total of 14 days. Then resume previous  pseudomonal coverage with Levaquin. CTA evaluation of chest and abdomen does not reveal any evidence of abscess or infection involving the AAA graft. Gall Bladder u/s negative for infection  Proteus infection ID consulted - After reviewing both the previous blood cultures from [facility] (that showed pseudomonas) and the current blood cultures (that show proteus mirabilis) they recommended meropenem and rifampin for a total of 14 days. Then resume previous pseudomonal coverage with Levaquin. CTA evaluation of chest and abdomen does not reveal any evidence of abscess or infection involving the AAA graft. Gall Bladder u/s negative for infection.    C. difficile diarrhea Patient was discovered to be c-diff positive on 12/16 and was started on oral vancomycin per ID recommendation. Patient should remain on oral vanc indefinitely until seen by his ID MD at [facility] (Dr. Vista Deck), as he will be on continuous levaquin after he finishes the primaxin and rifampin.   HCAP (healthcare-associated pneumonia) Seen on CTA chest. Speech Therapy evaluated for possible aspiration. Current antibiotic therapy will cover.  Acute on chronic combined systolic (EF 57%) and grade 1 diastolic congestive heart failure Compensated  Hematuria in the setting of hx of prostate CA and radiation therapy. Appreciate urology consultation. Foley placed due to acute urinary retention. He then  required continuous bladder irrigation for 24 hours due to hematuria with clots. His foley was discontinued on 12/17 and he continues to have appropriate urine output without gross hematuria. Hold aspirin and plavix. Plan to resume aspirin and plavix 12/23 after bleeding has ceased for 1 week. Patient required transfusion of 1 unit of PRBCs on 12/15.    Acute blood loss anemia with ABLA secondary to hematuria. Transfuse 1 unit of PRBCs 02/26/2014. Check CBC q 12 hours x 2.   Thrombocytopenia Chronic thrombocytopenia but  presenting levels were lower than his baseline likely due to gram-negative infection - plt count is now improving. Please follow outpatient to ensure continued stability  CKD (chronic kidney disease), stage III Baseline renal function BUN 26 creatinine 1.41 - creatinine now better than baseline (0.80) s/p volume resuscitation    History of DVT of lower extremity (Feb 2015) -post IVC filter of lower extremity (Feb 2015) - post IVC filter no lower extremity edema  Protein-calorie malnutrition, severe PEG tube feeds to continue - lytes stable   Dysphagia Status post insertion of PEG tube started on tube feedings during last admission; continue Jevity - able to tolerate oral intake for pleasure  Speech evaluation to define diet  Palliative care encounter In hosp, Son and daughters present. Extensive conversation. Daughter [name] is realistic and seems to understands father's condition. Family agreed to "DNI - Do not intubate". Recommend further Goals of care discussions at SNF.    Hennie Duos, MD

## 2014-03-10 ENCOUNTER — Encounter: Payer: Self-pay | Admitting: Internal Medicine

## 2014-03-10 DIAGNOSIS — R319 Hematuria, unspecified: Secondary | ICD-10-CM | POA: Insufficient documentation

## 2014-03-10 DIAGNOSIS — D62 Acute posthemorrhagic anemia: Secondary | ICD-10-CM | POA: Insufficient documentation

## 2014-03-10 NOTE — Assessment & Plan Note (Signed)
due to Pseudomonas Infected aortic graft. Now 1/2 blood cx + Proteus Mirabilis ID consulted - After reviewing both the previous blood cultures from [facility] (that showed pseudomonas) and the current blood cultures (that show proteus mirabilis) they recommended meropenem and rifampin for a total of 14 days. Then resume previous pseudomonal coverage with Levaquin. CTA evaluation of chest and abdomen does not reveal any evidence of abscess or infection involving the AAA graft. Gall Bladder u/s negative for infection

## 2014-03-10 NOTE — Assessment & Plan Note (Signed)
PEG tube feeds to continue - lytes stable

## 2014-03-10 NOTE — Assessment & Plan Note (Signed)
in the setting of hx of prostate CA and radiation therapy. Appreciate urology consultation. Foley placed due to acute urinary retention. He then required continuous bladder irrigation for 24 hours due to hematuria with clots. His foley was discontinued on 12/17 and he continues to have appropriate urine output without gross hematuria. Hold aspirin and plavix. Plan to resume aspirin and plavix 12/23 after bleeding has ceased for 1 week. Patient required transfusion of 1 unit of PRBCs on 12/15.

## 2014-03-10 NOTE — Assessment & Plan Note (Signed)
with ABLA secondary to hematuria. Transfuse 1 unit of PRBCs 02/26/2014. Check CBC q 12 hours x 2.

## 2014-03-10 NOTE — Assessment & Plan Note (Signed)
Patient was discovered to be c-diff positive on 12/16 and was started on oral vancomycin per ID recommendation. Patient should remain on oral vanc indefinitely until seen by his ID MD at [facility] (Dr. Vista Deck), as he will be on continuous levaquin after he finishes the primaxin and rifampin.

## 2014-03-10 NOTE — Assessment & Plan Note (Signed)
In hosp, Son and daughters present. Extensive conversation. Daughter [name] is realistic and seems to understands father's condition. Family agreed to "DNI - Do not intubate". Recommend further Goals of care discussions at SNF.

## 2014-03-10 NOTE — Assessment & Plan Note (Signed)
Seen on CTA chest. Speech Therapy evaluated for possible aspiration. Current antibiotic therapy will cover.

## 2014-03-10 NOTE — Assessment & Plan Note (Signed)
Chronic thrombocytopenia but presenting levels were lower than his baseline likely due to gram-negative infection - plt count is now improving. Please follow outpatient to ensure continued stability

## 2014-03-10 NOTE — Assessment & Plan Note (Signed)
of lower extremity (Feb 2015) - post IVC filter no lower extremity edema

## 2014-03-10 NOTE — Assessment & Plan Note (Signed)
Compensated 

## 2014-03-10 NOTE — Assessment & Plan Note (Signed)
Baseline renal function BUN 26 creatinine 1.41 - creatinine now better than baseline (0.80) s/p volume resuscitation

## 2014-03-10 NOTE — Assessment & Plan Note (Addendum)
ID consulted - After reviewing both the previous blood cultures from [facility] (that showed pseudomonas) and the current blood cultures (that show proteus mirabilis) they recommended meropenem and rifampin for a total of 14 days.

## 2014-03-10 NOTE — Assessment & Plan Note (Signed)
Status post insertion of PEG tube started on tube feedings during last admission; continue Jevity - able to tolerate oral intake for pleasure  Speech evaluation to define diet

## 2014-03-18 ENCOUNTER — Ambulatory Visit: Payer: Medicare Other | Admitting: Nurse Practitioner

## 2014-03-21 ENCOUNTER — Other Ambulatory Visit: Payer: Self-pay | Admitting: *Deleted

## 2014-03-21 MED ORDER — METHYLPHENIDATE HCL 5 MG PO TABS: 5.0000 mg | ORAL_TABLET | Freq: Every day | ORAL | Status: DC

## 2014-03-21 NOTE — Telephone Encounter (Signed)
[facility] 

## 2014-03-22 ENCOUNTER — Non-Acute Institutional Stay (SKILLED_NURSING_FACILITY): Payer: Medicare Other | Admitting: Nurse Practitioner

## 2014-03-22 DIAGNOSIS — B370 Candidal stomatitis: Secondary | ICD-10-CM

## 2014-03-22 NOTE — Progress Notes (Signed)
Patient ID: [name], male   DOB: 05/09/1929, 81 y.o.   MRN: [id number]    Nursing Home Location:  [place] of Service: SNF (30)  PCP: [name], MD  Allergies  Allergen Reactions  . Atorvastatin Other (See Comments)    Causes constipation  . Penicillins Other (See Comments)    Other reaction(s): RASH HIVES     Chief Complaint  Patient presents with  . Acute Visit    HPI:  Patient is a 81 y.o. male seen today at [facility] at the request of nursing. 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 Family concerned over thrush due to pt not swallowing and complaints that food has no taste. Pt denies any pain in month or trouble swallowing. Does have white film on tongue  Intake through PEG tube, will not take PO supplements   Review of Systems:  Review of Systems  Constitutional: Positive for appetite change. Negative for activity change, fatigue and unexpected weight change.  HENT: Negative for dental problem, drooling, facial swelling, mouth sores, postnasal drip, sore throat and trouble swallowing.   Respiratory: Negative for cough and shortness of breath.   Gastrointestinal: Negative for abdominal pain, diarrhea and constipation.    Past Medical History  Diagnosis Date  . Hypertension   . BPH (benign prostatic hyperplasia)   . Hyperlipidemia   . Cancer 2008-2009    prostate TREATED WITH RADIATION  . AAA (abdominal aortic aneurysm) 04/2012    STENTING OF AAA IN [place]  . Junctional cardiac arrhythmia     Occurred postoperatively after urologic surgery  . Severe sinus bradycardia     Occurred postoperatively after urologic surgery  . Aortic valve disorders   . Other primary cardiomyopathies   . Dysphagia   . Status post insertion of percutaneous endoscopic gastrostomy (PEG) tube 02/11/14  .  Bacteremia due to Pseudomonas   . Infected aortic graft    Past Surgical History  Procedure Laterality Date  . Abdominal surgery      PART OF COLON REMOVED FOR DIVERTICULITIS  . Appendectomy    . Hernia repair    . Bladder surgery  2008    FOR BLADDER STONE  . Total knee arthroplasty  1990    left  . Abdominal aortic aneurysm repair  04/2012  . Total knee arthroplasty Right 07/26/2012    Procedure: RIGHT TOTAL KNEE ARTHROPLASTY;  Surgeon: Tobi Bastos, MD;  Location: [facility];  Service: Orthopedics;  Laterality: Right;  . Nephrolithotomy Left 04/26/2013    Procedure: LEFT PERCUTANEOUS NEPHROLITHOTOMY ;  Surgeon: [name], MD;  Location: [facility];  Service: Urology;  Laterality: Left;  . Nephrolithotomy Left 05/03/2013    Procedure: 2ND STAGE LEFT PERCUTANEOUS NEPHROLITHOTOMY ;  Surgeon: [name], MD;  Location: [facility];  Service: Urology;  Laterality: Left;  . Esophagogastroduodenoscopy N/A 02/11/2014    WNU:UVOZD hiatal hernia. Patchy erythema of the gastric mucosa; otherwise negative EGD. Status post 62 French Microvasive PEG tube placement  . Peg placement N/A 02/11/2014    Procedure: PERCUTANEOUS ENDOSCOPIC GASTROSTOMY (PEG) PLACEMENT;  Surgeon: Daneil Dolin, MD;  Location: [facility];  Service: Endoscopy;  Laterality: N/A;   Social History:   reports that he quit smoking about 29 years ago. His smoking use included Cigarettes. He started smoking about 65 years ago. He has  a 74 pack-year smoking history. He has never used smokeless tobacco. He reports that he does not drink alcohol or use illicit drugs.  Family History  Problem Relation Age of Onset  . Lung cancer Father     Medications: Patient's Medications  New Prescriptions   No medications on file  Previous Medications   ACETAMINOPHEN (TYLENOL) 500 MG TABLET    Take 1,000 mg by mouth every 6 (six) hours as needed for mild pain or moderate pain.   ALBUTEROL (PROVENTIL) (2.5 MG/3ML) 0.083% NEBULIZER SOLUTION    Take  3 mLs (2.5 mg total) by nebulization every 2 (two) hours as needed for wheezing or shortness of breath.   AMINO ACIDS-PROTEIN HYDROLYS (FEEDING SUPPLEMENT, PRO-STAT SUGAR FREE 64,) LIQD    Place 30 mLs into feeding tube 3 (three) times daily.   DOCUSATE SODIUM (COLACE) 100 MG CAPSULE    Take 100 mg by mouth daily as needed for mild constipation.   DOXAZOSIN (CARDURA) 4 MG TABLET    Take 4 mg by mouth at bedtime.   FAMOTIDINE (PEPCID) 40 MG/5ML SUSPENSION    Place 5 mLs (40 mg total) into feeding tube daily.   LEVOFLOXACIN (LEVAQUIN) 750 MG TABLET    Please resume oral levaquin when IV Meropenem is complete on 12/25.  Continue indefintely for suppression of infection.  This medication will be managed by Infectious Disease at [facility].   LORAZEPAM (ATIVAN) 2 MG/ML CONCENTRATED SOLUTION    Take 0.1 mLs (0.2 mg total) by mouth every 6 (six) hours as needed for anxiety.   MEROPENEM (MERREM) 1 G INJECTION    Inject 1 g into the muscle every 8 (eight) hours.   MEROPENEM 1 G IN SODIUM CHLORIDE 0.9 % 100 ML    Inject 1 g into the vein every 8 (eight) hours.   METHYLPHENIDATE (RITALIN) 5 MG TABLET    Take 1 tablet (5 mg total) by mouth daily.   METOPROLOL SUCCINATE (TOPROL-XL) 25 MG 24 HR TABLET    Take 25 mg by mouth daily.   MIRTAZAPINE (REMERON) 30 MG TABLET    Take 30 mg by mouth at bedtime.   NUTRITIONAL SUPPLEMENTS (FEEDING SUPPLEMENT, JEVITY 1.2 CAL,) LIQD    Place 1,000 mLs into feeding tube continuous.   RIFAMPIN (RIFADIN) 300 MG CAPSULE    Take 300 mg by mouth every 12 (twelve) hours.   SIMVASTATIN (ZOCOR) 20 MG TABLET    Take 20 mg by mouth daily.   TRAMADOL (ULTRAM) 50 MG TABLET    Take 1 tablet (50 mg total) by mouth every 6 (six) hours as needed for moderate pain.   TRIAMCINOLONE CREAM (KENALOG) 0.1 %    Apply 1 application topically 2 (two) times daily as needed (rash).   VANCOMYCIN (VANCOCIN) 50 MG/ML ORAL SOLUTION    For C-Diff.  Continue indefinitely until patient is seen in follow up at [facility]  Infectious Disease. (as the patient will be on continuous Levaquin).   WATER FOR IRRIGATION, STERILE (FREE WATER) SOLN    Place 100 mLs into feeding tube every 8 (eight) hours.  Modified Medications   No medications on file  Discontinued Medications   No medications on file     Physical Exam: Filed Vitals:   03/22/14 1436  BP: 98/62  Pulse: 60  Temp: 96.2 F (35.7 C)  Resp: 20    Physical Exam  Constitutional: He appears well-developed and well-nourished. No distress.  HENT:  Head: Normocephalic and atraumatic.  Right Ear: External ear normal.  Left Ear: External ear normal.  Nose: Nose normal.  Mouth/Throat: Oropharynx is clear and moist. No oropharyngeal exudate.  White film to tongue   Eyes: Conjunctivae are normal. Pupils are equal, round, and reactive to light.  Neck: Normal range of motion. Neck supple.  Lymphadenopathy:    He has no cervical adenopathy.  Skin: He is not diaphoretic.    Labs reviewed: Basic Metabolic Panel:  Recent Labs  05/14/13 0845  02/08/14 1146  02/10/14 0652 02/11/14 0603  02/23/14 0049  02/25/14 0434 02/27/14 0500 02/28/14 0934  NA 138  < >  --   < >  --  146  < > 138  < > 136* 139 136*  K 4.0  < >  --   < >  --  3.7  < > 3.9  < > 4.1 4.1 4.0  CL 102  < >  --   < >  --  109  < > 107  < > 102 103 103  CO2 26  < >  --   < >  --  27  < > 19  < > 23 23 25   GLUCOSE 99  < >  --   < >  --  93  < > 107*  < > 98 105* 118*  BUN 16  < >  --   < >  --  26*  < > 31*  < > 21 24* 24*  CREATININE 1.19  < >  --   < >  --  1.41*  < > 1.10  < > 0.88 0.84 0.80  CALCIUM 9.5  < >  --   < >  --  9.5  < > 8.6  < > 8.6 8.8 8.8  MG 2.0  --   --   --  1.6  --   --  1.8  --   --   --   --   PHOS 3.6  --  2.2*  --   --  2.2*  --  2.4  --   --   --   --   < > = values in this interval not displayed. Liver Function Tests:  Recent Labs  02/21/14 1127 02/23/14 0049 02/24/14 0300  AST 26 24 24   ALT 13 13 12   ALKPHOS 69 52 66  BILITOT 0.6 0.4 0.6  PROT  7.0 5.4* 5.5*  ALBUMIN 2.5* 1.7* 1.8*    Recent Labs  02/06/14 1454  LIPASE 69*   No results for input(s): AMMONIA in the last 8760 hours. CBC:  Recent Labs  02/06/14 1454  02/21/14 1127  02/26/14 0541 02/27/14 0500 02/28/14 0934  WBC 4.8  < > 3.4*  < > 4.8 5.5 4.6  NEUTROABS 3.6  --  3.0  --   --   --  2.7  HGB 10.1*  < > 9.5*  < > 7.1* 8.2* 8.0*  HCT 31.4*  < > 29.5*  < > 21.8* 25.1* 24.7*  MCV 82.6  < > 82.6  < > 79.3 81.5 82.9  PLT 72*  < > 103*  < > 84* 87* 103*  < > = values in this interval not displayed. TSH:  Recent Labs  02/06/14 1454  TSH 3.000   A1C: No results found for: HGBA1C Lipid Panel: No results for input(s): CHOL, HDL, LDLCALC, TRIG, CHOLHDL, LDLDIRECT in the last 8760 hours.    Assessment/Plan Thrust Magic mouthwash 5 cc QID for 1 week

## 2014-03-23 ENCOUNTER — Emergency Department (HOSPITAL_COMMUNITY): Payer: Medicare Other

## 2014-03-23 ENCOUNTER — Inpatient Hospital Stay (HOSPITAL_COMMUNITY)
Admission: EM | Admit: 2014-03-23 | Discharge: 2014-03-27 | DRG: 314 | Disposition: A | Discharge status: Skilled Nursing Facility | Payer: Medicare Other | Attending: Internal Medicine | Admitting: Internal Medicine

## 2014-03-23 ENCOUNTER — Encounter (HOSPITAL_COMMUNITY): Payer: Self-pay | Admitting: Emergency Medicine

## 2014-03-23 DIAGNOSIS — B965 Pseudomonas (aeruginosa) (mallei) (pseudomallei) as the cause of diseases classified elsewhere: Secondary | ICD-10-CM | POA: Diagnosis present

## 2014-03-23 DIAGNOSIS — T827XXA Infection and inflammatory reaction due to other cardiac and vascular devices, implants and grafts, initial encounter: Secondary | ICD-10-CM | POA: Diagnosis not present

## 2014-03-23 DIAGNOSIS — A419 Sepsis, unspecified organism: Secondary | ICD-10-CM

## 2014-03-23 DIAGNOSIS — Z792 Long term (current) use of antibiotics: Secondary | ICD-10-CM | POA: Diagnosis not present

## 2014-03-23 DIAGNOSIS — H9190 Unspecified hearing loss, unspecified ear: Secondary | ICD-10-CM | POA: Diagnosis present

## 2014-03-23 DIAGNOSIS — Y95 Nosocomial condition: Secondary | ICD-10-CM | POA: Diagnosis present

## 2014-03-23 DIAGNOSIS — E876 Hypokalemia: Secondary | ICD-10-CM | POA: Diagnosis present

## 2014-03-23 DIAGNOSIS — I429 Cardiomyopathy, unspecified: Secondary | ICD-10-CM | POA: Diagnosis present

## 2014-03-23 DIAGNOSIS — Z931 Gastrostomy status: Secondary | ICD-10-CM | POA: Diagnosis not present

## 2014-03-23 DIAGNOSIS — E785 Hyperlipidemia, unspecified: Secondary | ICD-10-CM | POA: Diagnosis present

## 2014-03-23 DIAGNOSIS — I129 Hypertensive chronic kidney disease with stage 1 through stage 4 chronic kidney disease, or unspecified chronic kidney disease: Secondary | ICD-10-CM | POA: Diagnosis present

## 2014-03-23 DIAGNOSIS — A047 Enterocolitis due to Clostridium difficile: Secondary | ICD-10-CM | POA: Diagnosis present

## 2014-03-23 DIAGNOSIS — Z96651 Presence of right artificial knee joint: Secondary | ICD-10-CM | POA: Diagnosis present

## 2014-03-23 DIAGNOSIS — R1310 Dysphagia, unspecified: Secondary | ICD-10-CM | POA: Diagnosis present

## 2014-03-23 DIAGNOSIS — Z8546 Personal history of malignant neoplasm of prostate: Secondary | ICD-10-CM | POA: Diagnosis not present

## 2014-03-23 DIAGNOSIS — Z87891 Personal history of nicotine dependence: Secondary | ICD-10-CM | POA: Diagnosis not present

## 2014-03-23 DIAGNOSIS — B964 Proteus (mirabilis) (morganii) as the cause of diseases classified elsewhere: Secondary | ICD-10-CM | POA: Diagnosis present

## 2014-03-23 DIAGNOSIS — I714 Abdominal aortic aneurysm, without rupture: Secondary | ICD-10-CM | POA: Diagnosis present

## 2014-03-23 DIAGNOSIS — R7881 Bacteremia: Secondary | ICD-10-CM

## 2014-03-23 DIAGNOSIS — R6521 Severe sepsis with septic shock: Secondary | ICD-10-CM | POA: Diagnosis present

## 2014-03-23 DIAGNOSIS — N183 Chronic kidney disease, stage 3 (moderate): Secondary | ICD-10-CM | POA: Diagnosis present

## 2014-03-23 DIAGNOSIS — N400 Enlarged prostate without lower urinary tract symptoms: Secondary | ICD-10-CM | POA: Diagnosis present

## 2014-03-23 DIAGNOSIS — R001 Bradycardia, unspecified: Secondary | ICD-10-CM | POA: Diagnosis present

## 2014-03-23 DIAGNOSIS — E43 Unspecified severe protein-calorie malnutrition: Secondary | ICD-10-CM | POA: Diagnosis present

## 2014-03-23 DIAGNOSIS — Z888 Allergy status to other drugs, medicaments and biological substances status: Secondary | ICD-10-CM | POA: Diagnosis not present

## 2014-03-23 DIAGNOSIS — Z880 Allergy status to penicillin: Secondary | ICD-10-CM

## 2014-03-23 DIAGNOSIS — A4152 Sepsis due to Pseudomonas: Secondary | ICD-10-CM | POA: Diagnosis present

## 2014-03-23 DIAGNOSIS — Z79899 Other long term (current) drug therapy: Secondary | ICD-10-CM

## 2014-03-23 DIAGNOSIS — J189 Pneumonia, unspecified organism: Secondary | ICD-10-CM | POA: Diagnosis present

## 2014-03-23 DIAGNOSIS — D638 Anemia in other chronic diseases classified elsewhere: Secondary | ICD-10-CM | POA: Diagnosis present

## 2014-03-23 DIAGNOSIS — I5040 Unspecified combined systolic (congestive) and diastolic (congestive) heart failure: Secondary | ICD-10-CM | POA: Diagnosis present

## 2014-03-23 DIAGNOSIS — R627 Adult failure to thrive: Secondary | ICD-10-CM | POA: Diagnosis present

## 2014-03-23 DIAGNOSIS — R319 Hematuria, unspecified: Secondary | ICD-10-CM | POA: Diagnosis present

## 2014-03-23 DIAGNOSIS — Y832 Surgical operation with anastomosis, bypass or graft as the cause of abnormal reaction of the patient, or of later complication, without mention of misadventure at the time of the procedure: Secondary | ICD-10-CM | POA: Diagnosis present

## 2014-03-23 DIAGNOSIS — D696 Thrombocytopenia, unspecified: Secondary | ICD-10-CM | POA: Diagnosis present

## 2014-03-23 LAB — URINALYSIS, ROUTINE W REFLEX MICROSCOPIC
BILIRUBIN URINE: NEGATIVE
GLUCOSE, UA: NEGATIVE mg/dL
KETONES UR: 15 mg/dL — AB
Nitrite: NEGATIVE
Protein, ur: NEGATIVE mg/dL
Specific Gravity, Urine: 1.02 (ref 1.005–1.030)
Urobilinogen, UA: 0.2 mg/dL (ref 0.0–1.0)
pH: 5.5 (ref 5.0–8.0)

## 2014-03-23 LAB — CBC WITH DIFFERENTIAL/PLATELET
BASOS ABS: 0 10*3/uL (ref 0.0–0.1)
Basophils Relative: 0 % (ref 0–1)
EOS ABS: 0 10*3/uL (ref 0.0–0.7)
EOS PCT: 0 % (ref 0–5)
HCT: 28.2 % — ABNORMAL LOW (ref 39.0–52.0)
Hemoglobin: 9.2 g/dL — ABNORMAL LOW (ref 13.0–17.0)
LYMPHS ABS: 0.6 10*3/uL — AB (ref 0.7–4.0)
Lymphocytes Relative: 7 % — ABNORMAL LOW (ref 12–46)
MCH: 26.9 pg (ref 26.0–34.0)
MCHC: 32.6 g/dL (ref 30.0–36.0)
MCV: 82.5 fL (ref 78.0–100.0)
Monocytes Absolute: 0.8 10*3/uL (ref 0.1–1.0)
Monocytes Relative: 10 % (ref 3–12)
NEUTROS PCT: 83 % — AB (ref 43–77)
Neutro Abs: 6.8 10*3/uL (ref 1.7–7.7)
PLATELETS: 113 10*3/uL — AB (ref 150–400)
RBC: 3.42 MIL/uL — ABNORMAL LOW (ref 4.22–5.81)
RDW: 19.1 % — AB (ref 11.5–15.5)
WBC: 8.2 10*3/uL (ref 4.0–10.5)

## 2014-03-23 LAB — I-STAT CG4 LACTIC ACID, ED
LACTIC ACID, VENOUS: 2.01 mmol/L (ref 0.5–2.2)
Lactic Acid, Venous: 1.41 mmol/L (ref 0.5–2.2)

## 2014-03-23 LAB — COMPREHENSIVE METABOLIC PANEL
ALT: 9 U/L (ref 0–53)
AST: 24 U/L (ref 0–37)
Albumin: 2.3 g/dL — ABNORMAL LOW (ref 3.5–5.2)
Alkaline Phosphatase: 51 U/L (ref 39–117)
Anion gap: 5 (ref 5–15)
BILIRUBIN TOTAL: 0.6 mg/dL (ref 0.3–1.2)
BUN: 21 mg/dL (ref 6–23)
CALCIUM: 8.6 mg/dL (ref 8.4–10.5)
CO2: 24 mmol/L (ref 19–32)
CREATININE: 1.34 mg/dL (ref 0.50–1.35)
Chloride: 107 mEq/L (ref 96–112)
GFR calc Af Amer: 55 mL/min — ABNORMAL LOW (ref >90)
GFR calc non Af Amer: 48 mL/min — ABNORMAL LOW (ref >90)
GLUCOSE: 112 mg/dL — AB (ref 70–99)
Potassium: 3.6 mmol/L (ref 3.5–5.1)
Sodium: 136 mmol/L (ref 135–145)
Total Protein: 5.8 g/dL — ABNORMAL LOW (ref 6.0–8.3)

## 2014-03-23 LAB — URINE MICROSCOPIC-ADD ON

## 2014-03-23 LAB — MRSA PCR SCREENING: MRSA by PCR: NEGATIVE

## 2014-03-23 LAB — PROTIME-INR
INR: 1.23 (ref 0.00–1.49)
PROTHROMBIN TIME: 15.7 s — AB (ref 11.6–15.2)

## 2014-03-23 LAB — I-STAT TROPONIN, ED: Troponin i, poc: 0.02 ng/mL (ref 0.00–0.08)

## 2014-03-23 MED ORDER — VANCOMYCIN 50 MG/ML ORAL SOLUTION
500.0000 mg | Freq: Four times a day (QID) | ORAL | Status: DC
  Administered 2014-03-23 – 2014-03-27 (×17): 500 mg
  Filled 2014-03-23 (×21): qty 10

## 2014-03-23 MED ORDER — ACETAMINOPHEN 650 MG RE SUPP
650.0000 mg | Freq: Once | RECTAL | Status: AC
Start: 2014-03-23 — End: 2014-03-23
  Administered 2014-03-23: 650 mg via RECTAL
  Filled 2014-03-23: qty 1

## 2014-03-23 MED ORDER — SODIUM CHLORIDE 0.9 % IV BOLUS (SEPSIS)
1000.0000 mL | Freq: Once | INTRAVENOUS | Status: AC
  Administered 2014-03-23: 1000 mL via INTRAVENOUS

## 2014-03-23 MED ORDER — SODIUM CHLORIDE 0.9 % IV SOLN
1.0000 g | Freq: Two times a day (BID) | INTRAVENOUS | Status: DC
  Administered 2014-03-24 – 2014-03-27 (×7): 1 g via INTRAVENOUS
  Filled 2014-03-23 (×9): qty 1

## 2014-03-23 MED ORDER — ALBUTEROL SULFATE (2.5 MG/3ML) 0.083% IN NEBU: 2.5000 mg | INHALATION_SOLUTION | RESPIRATORY_TRACT | Status: DC | PRN

## 2014-03-23 MED ORDER — VANCOMYCIN HCL IN DEXTROSE 750-5 MG/150ML-% IV SOLN
750.0000 mg | Freq: Two times a day (BID) | INTRAVENOUS | Status: DC
  Administered 2014-03-24 – 2014-03-27 (×7): 750 mg via INTRAVENOUS
  Filled 2014-03-23 (×9): qty 150

## 2014-03-23 MED ORDER — JEVITY 1.2 CAL PO LIQD
1000.0000 mL | ORAL | Status: DC
  Administered 2014-03-23 – 2014-03-26 (×3): 1000 mL
  Filled 2014-03-23 (×11): qty 1000

## 2014-03-23 MED ORDER — FAMOTIDINE 40 MG/5ML PO SUSR
40.0000 mg | Freq: Every day | ORAL | Status: DC
  Administered 2014-03-24 – 2014-03-27 (×4): 40 mg
  Filled 2014-03-23 (×4): qty 5

## 2014-03-23 MED ORDER — VANCOMYCIN HCL 10 G IV SOLR
1500.0000 mg | Freq: Once | INTRAVENOUS | Status: AC
  Administered 2014-03-23: 1500 mg via INTRAVENOUS
  Filled 2014-03-23: qty 1500

## 2014-03-23 MED ORDER — SODIUM CHLORIDE 0.9 % IV SOLN
INTRAVENOUS | Status: DC
  Administered 2014-03-23: 18:00:00 via INTRAVENOUS
  Administered 2014-03-24: 125 mL via INTRAVENOUS

## 2014-03-23 MED ORDER — HEPARIN SODIUM (PORCINE) 5000 UNIT/ML IJ SOLN
5000.0000 [IU] | Freq: Three times a day (TID) | INTRAMUSCULAR | Status: DC
  Administered 2014-03-23 – 2014-03-27 (×12): 5000 [IU] via SUBCUTANEOUS
  Filled 2014-03-23 (×15): qty 1

## 2014-03-23 MED ORDER — DOCUSATE SODIUM 100 MG PO CAPS
100.0000 mg | ORAL_CAPSULE | Freq: Every day | ORAL | Status: DC | PRN
  Filled 2014-03-23: qty 1

## 2014-03-23 MED ORDER — SODIUM CHLORIDE 0.9 % IV SOLN: 250.0000 mL | INTRAVENOUS | Status: DC | PRN

## 2014-03-23 MED ORDER — SODIUM CHLORIDE 0.9 % IV SOLN
1.0000 g | Freq: Once | INTRAVENOUS | Status: AC
  Administered 2014-03-23: 1 g via INTRAVENOUS
  Filled 2014-03-23: qty 1

## 2014-03-23 MED ORDER — FREE WATER
100.0000 mL | Freq: Three times a day (TID) | Status: DC
  Administered 2014-03-23 – 2014-03-27 (×12): 100 mL

## 2014-03-23 NOTE — ED Notes (Signed)
Family reports he can urinate in a urinal; last time hospitalized 4 attempts at catheterizing were unsuccessful and they would like to allow him to urinate on his own if possible.

## 2014-03-23 NOTE — ED Notes (Signed)
Multiple attempts to use urinal without success; patient states he feels like he needs to urinate but cannot. Notified Dr. Wyvonnia Dusky who stated to insert a foley catheter.

## 2014-03-23 NOTE — H&P (Signed)
PULMONARY / CRITICAL CARE MEDICINE   Name: [name] MRN: [id number] DOB: 1929/12/06    ADMISSION DATE:  03/23/2014 CONSULTATION DATE:  1/9  REFERRING MD :  Rancour   CHIEF COMPLAINT:  Septic shock   INITIAL PRESENTATION:  81 year old male w/ failure to thrive. Resides at SNF where he is being treated for chronic AAA pseudomonal graft infection and Cdiff. Followed at [facility]. Changed from carbapenem to oral Levaquin 12/10. Admitted on 1/9 to SDU w/ 2 d h/o cough, and fever. Admitted w/ working dx of new LLL HCAP +/- recurrent Pseudomonas infection w/ resultant sepsis.   STUDIES:   SIGNIFICANT EVENTS:  HISTORY OF PRESENT ILLNESS:   81yo male with 10 hospitalizations since endovascular repair of thoracoabdominal aortic aneurysm in 09/2013, initially for repairs of endovascular leak and Pseudomonal sepsis 2/2 endovascular graft infection, but more recently for failure to thrive s/p PEG tube, sent from [address] for ID followup. From an ID standpoint, he has a chronic Pseudomonal endovascular graft infection that has been deemed inoperable, is s/p 6 weeks of IV antibiotics and has now been transitioned to oral levofloxacin for long-term suppression, per his ID MD at [facility]. Per history presented to ED w/ 2 d h/o cough, then developed fever >103 on 1/9. EMS called, found him hypotensive and tachycardic. Brought to ER for further eval.   PAST MEDICAL HISTORY :   has a past medical history of Hypertension; BPH (benign prostatic hyperplasia); Hyperlipidemia; Cancer (2008-2009); AAA (abdominal aortic aneurysm) (04/2012); Junctional cardiac arrhythmia; Severe sinus bradycardia; Aortic valve disorders; Other primary cardiomyopathies; Dysphagia; Status post insertion of percutaneous endoscopic gastrostomy (PEG) tube (02/11/14); Bacteremia due to Pseudomonas; and Infected aortic graft.  has past surgical history that includes Abdominal surgery; Appendectomy; Hernia repair; Bladder surgery (2008); Total  knee arthroplasty (1990); Abdominal aortic aneurysm repair (04/2012); Total knee arthroplasty (Right, 07/26/2012); Nephrolithotomy (Left, 04/26/2013); Nephrolithotomy (Left, 05/03/2013); Esophagogastroduodenoscopy (N/A, 02/11/2014); and PEG placement (N/A, 02/11/2014).   Prior to Admission medications   Medication Sig Start Date End Date Taking? Authorizing Provider  acetaminophen (TYLENOL) 500 MG tablet Take 1,000 mg by mouth every 6 (six) hours as needed for mild pain or moderate pain.   Yes Historical Provider, MD  albuterol (PROVENTIL) (2.5 MG/3ML) 0.083% nebulizer solution Take 3 mLs (2.5 mg total) by nebulization every 2 (two) hours as needed for wheezing or shortness of breath. 02/28/14  Yes Marianne L York, PA-C  Alum & Mag Hydroxide-Simeth (MAGIC MOUTHWASH) SOLN Take 5 mLs by mouth 4 (four) times daily. 10 day regimen started 03/22/14   Yes Historical Provider, MD  Amino Acids-Protein Hydrolys (FEEDING SUPPLEMENT, PRO-STAT SUGAR FREE 64,) LIQD Place 30 mLs into feeding tube 3 (three) times daily. 02/13/14  Yes Erline Hau, MD  docusate sodium (COLACE) 100 MG capsule Take 100 mg by mouth daily as needed for mild constipation.   Yes Historical Provider, MD  doxazosin (CARDURA) 4 MG tablet Take 4 mg by mouth at bedtime.   Yes Historical Provider, MD  famotidine (PEPCID) 40 MG/5ML suspension Place 5 mLs (40 mg total) into feeding tube daily. 03/01/14  Yes Bobby Rumpf York, PA-C  levofloxacin (LEVAQUIN) 750 MG tablet Please resume oral levaquin when IV Meropenem is complete on 12/25.  Continue indefintely for suppression of infection.  This medication will be managed by Infectious Disease at [facility]. 02/28/14  Yes Bobby Rumpf York, PA-C  LORazepam (ATIVAN) 2 MG/ML concentrated solution Take 0.1 mLs (0.2 mg total) by mouth every 6 (six)  hours as needed for anxiety. 02/28/14  Yes Bobby Rumpf York, PA-C  methylphenidate (RITALIN) 5 MG tablet Take 1 tablet (5 mg total) by mouth daily. 03/21/14  Yes  Tiffany L Reed, DO  metoprolol succinate (TOPROL-XL) 25 MG 24 hr tablet Take 25 mg by mouth daily.   Yes Historical Provider, MD  mirtazapine (REMERON) 30 MG tablet Take 30 mg by mouth at bedtime.   Yes Historical Provider, MD  Nutritional Supplements (FEEDING SUPPLEMENT, JEVITY 1.2 CAL,) LIQD Place 1,000 mLs into feeding tube continuous. 02/13/14  Yes Erline Hau, MD  rifampin (RIFADIN) 300 MG capsule Take 300 mg by mouth every 12 (twelve) hours.   Yes Historical Provider, MD  simvastatin (ZOCOR) 20 MG tablet Take 20 mg by mouth daily.   Yes Historical Provider, MD  traMADol (ULTRAM) 50 MG tablet Take 1 tablet (50 mg total) by mouth every 6 (six) hours as needed for moderate pain. 02/28/14  Yes Bobby Rumpf York, PA-C  triamcinolone cream (KENALOG) 0.1 % Apply 1 application topically 2 (two) times daily as needed (rash).   Yes Historical Provider, MD  vancomycin (VANCOCIN) 50 mg/mL oral solution For C-Diff.  Continue indefinitely until patient is seen in follow up at [facility] Infectious Disease. (as the patient will be on continuous Levaquin). 02/28/14  Yes Bobby Rumpf York, PA-C  Water For Irrigation, Sterile (FREE WATER) SOLN Place 100 mLs into feeding tube every 8 (eight) hours. 02/28/14  Yes Bobby Rumpf York, PA-C   Allergies  Allergen Reactions  . Atorvastatin Other (See Comments)    Causes constipation  . Penicillins Other (See Comments)    Other reaction(s): RASH HIVES     FAMILY HISTORY:  indicated that his mother is deceased. He indicated that his father is deceased.  SOCIAL HISTORY:  reports that he quit smoking about 29 years ago. His smoking use included Cigarettes. He started smoking about 65 years ago. He has a 74 pack-year smoking history. He has never used smokeless tobacco. He reports that he does not drink alcohol or use illicit drugs.  REVIEW OF SYSTEMS:  Unable d/t HOH  SUBJECTIVE:  No acute   VITAL SIGNS: Temp:  [105.1 F (40.6 C)] 105.1 F (40.6 C) (01/09  1432) Pulse Rate:  [105-123] 105 (01/09 1548) Resp:  [25-32] 25 (01/09 1548) BP: (84-106)/(49-57) 84/49 mmHg (01/09 1548) SpO2:  [90 %-95 %] 92 % (01/09 1548) Weight:  [98.2 kg (216 lb 7.9 oz)] 98.2 kg (216 lb 7.9 oz) (01/09 1444) HEMODYNAMICS:   VENTILATOR SETTINGS:   INTAKE / OUTPUT: No intake or output data in the 24 hours ending 03/23/14 1606  PHYSICAL EXAMINATION: General:  Chronically ill appearing male, no acute distress.  Neuro:  Awake, alert, very HOH  HEENT:  , no JVD  Cardiovascular:  rrr Lungs:  Crackles bilateral bases but no accessory muscle use  Abdomen:  Soft, non-tender PEG unremarkable  Musculoskeletal:  Intact  Skin:  Dry and intact   LABS:  CBC  Recent Labs Lab 03/23/14 1455  WBC 8.2  HGB 9.2*  HCT 28.2*  PLT 113*   Coag's  Recent Labs Lab 03/23/14 1455  INR 1.23   BMET  Recent Labs Lab 03/23/14 1455  NA 136  K 3.6  CL 107  CO2 24  BUN 21  CREATININE 1.34  GLUCOSE 112*   Electrolytes  Recent Labs Lab 03/23/14 1455  CALCIUM 8.6   Sepsis Markers  Recent Labs Lab 03/23/14 1514  LATICACIDVEN 2.01   ABG No  results for input(s): PHART, PCO2ART, PO2ART in the last 168 hours. Liver Enzymes  Recent Labs Lab 03/23/14 1455  AST 24  ALT 9  ALKPHOS 51  BILITOT 0.6  ALBUMIN 2.3*   Cardiac Enzymes No results for input(s): TROPONINI, PROBNP in the last 168 hours. Glucose No results for input(s): GLUCAP in the last 168 hours.  Imaging No results found.   ASSESSMENT / PLAN:  1) Severe sepsis/ septic shock  New PNA w/ LL infiltrate.  2) Chronic pseudomonal endovascular graft infection, deemed inoperable at [facility]. Transitioned from carbapenem to levaquin by ID at [facility] in beginning of Dec 2015.  >the question here is: is this a pneumonia separate from the chronic pseudomonal infection or is this simply systemic effect from acute on chronic AAA graft infection. If his blood cultures come back positive again w/ Pseudomonas I  think we are dealing with a futile situation (ID at [facility] has recommended palliative care) 3) Cdiff  Plan:   Continue IV hydration  No pressors O2 as needed  BCx2  1/9>>> UC 1/9>>> Imipenem 1/9>>> IV vanc 1/9>> Oral vanc 1/9>>>  4) Chronic dysphagia and FTT: > 40 lb wt loss in last 4 mo.  Plan:   Nutritional consult  5) Anemia of chronic disease.  Plan Trend cbc  [place] heparin   6) h/o diastolic dysfxn  Plan Hold antihypertensives  7) CKD Plan Hold antihypertensives F/u chemistry Strict I&O   Summary   Discussed care limitations w/ daughters. We will admit w/ conservative approach to care. This will include: IV antibiotics, IV hydration and supportive care. We will not escalate things if he worsens. Limit care to IV hydration and abx. If his cultures again come back positive for Pseudomonas think we need to consider hospice and palliation as continued treatment seems futile given his failure to respond to therapy and nothing further to offer even at [facility].   Erick Colace ACNP-BC [place] Pager # [phone] OR # [phone] if no answer 03/23/2014, 4:06 PM  Attending Note:  I have examined patient, reviewed labs, studies and notes. I have discussed the case with Jerrye Bushy and with the patient's daughters at bedside. I agree with the data and plans as amended above. He has been chronically ill form an irreversible aortic graft pseudomonal infection, C diff colitis. Chronic abx therapy has allowed him some degree of stability. Now presents with fever and malaise, FTF. Unclear whether this is progression of his chronic infection(s) vs a new process. He does have sx and a CXR consistent with HCAP. Discussed with family the ramifications of these two possibilities, and that progression of his chronic infxn may be irreversible. They understand. We will plan to give IVF, abx, supportive care. I have recommended against pressors, invasive procedures, life support.  Independent critical care time is 60 minutes.   Baltazar Apo, MD, PhD 03/23/2014, 5:35 PM [facility] [phone] or if no answer [phone]

## 2014-03-23 NOTE — ED Notes (Signed)
Intensivist at bedside.

## 2014-03-23 NOTE — ED Notes (Signed)
Notified Rancour of BP change after all boluses complete. He stated to start the continuous fluids per orders.

## 2014-03-23 NOTE — Progress Notes (Addendum)
ANTIBIOTIC CONSULT NOTE - INITIAL  Pharmacy Consult for Merpopenem Indication: abdominal infection  Allergies  Allergen Reactions  . Atorvastatin Other (See Comments)    Causes constipation  . Penicillins Other (See Comments)    Other reaction(s): RASH HIVES     Patient Measurements: Weight: 216 lb 7.9 oz (98.2 kg)  Vital Signs: Temp: 105.1 F (40.6 C) (01/09 1432) Temp Source: Rectal (01/09 1432) BP: 88/57 mmHg (01/09 1529) Pulse Rate: 105 (01/09 1529) Intake/Output from previous day:   Intake/Output from this shift:    Labs:  Recent Labs  03/23/14 1455  WBC 8.2  HGB 9.2*  PLT 113*  CREATININE 1.34   Estimated Creatinine Clearance: 49.4 mL/min (by C-G formula based on Cr of 1.34). No results for input(s): VANCOTROUGH, VANCOPEAK, VANCORANDOM, GENTTROUGH, GENTPEAK, GENTRANDOM, TOBRATROUGH, TOBRAPEAK, TOBRARND, AMIKACINPEAK, AMIKACINTROU, AMIKACIN in the last 72 hours.   Microbiology: Recent Results (from the past 720 hour(s))  MRSA PCR Screening     Status: None   Collection Time: 02/21/14  9:58 PM  Result Value Ref Range Status   MRSA by PCR NEGATIVE NEGATIVE Final    Comment:        The GeneXpert MRSA Assay (FDA approved for NASAL specimens only), is one component of a comprehensive MRSA colonization surveillance program. It is not intended to diagnose MRSA infection nor to guide or monitor treatment for MRSA infections.   Urine culture     Status: None   Collection Time: 02/22/14 10:29 AM  Result Value Ref Range Status   Specimen Description URINE, CATHETERIZED  Final   Special Requests Normal  Final   Culture  Setup Time   Final    02/22/2014 17:17 Performed at [place] Performed at [facility]   Final   Culture NO GROWTH Performed at [facility]   Final   Report Status 02/23/2014 FINAL  Final  Clostridium Difficile by PCR     Status: Abnormal   Collection Time: 02/26/14  3:56 PM   Result Value Ref Range Status   C difficile by pcr POSITIVE (A) NEGATIVE Final    Comment: CRITICAL RESULT CALLED TO, READ BACK BY AND VERIFIED WITH: Terrance Mass RN 02/27/14 0830 COSTELLO B     Medical History: Past Medical History  Diagnosis Date  . Hypertension   . BPH (benign prostatic hyperplasia)   . Hyperlipidemia   . Cancer 2008-2009    prostate TREATED WITH RADIATION  . AAA (abdominal aortic aneurysm) 04/2012    STENTING OF AAA IN [place]  . Junctional cardiac arrhythmia     Occurred postoperatively after urologic surgery  . Severe sinus bradycardia     Occurred postoperatively after urologic surgery  . Aortic valve disorders   . Other primary cardiomyopathies   . Dysphagia   . Status post insertion of percutaneous endoscopic gastrostomy (PEG) tube 02/11/14  . Bacteremia due to Pseudomonas   . Infected aortic graft     Assessment: 81 year old male from [facility] admitted with fever of 103 and decreased lung sounds.  Pharmacy asked to begin meropenem for intra-abdominal infection.  Goal of Therapy:  Appropriate dosing  Plan:  Meropenem 1 gram iv Q 12 hours Follow up Scr, fever trend, cultures.  Thank you. Anette Guarneri, PharmD [phone]  03/23/2014,3:45 PM  Addendum: Adding vancomycin for r/o HCAP as well.  Plan: Vancomycin 1500mg  IV x1 then 750mg  IV every 12 hours.   Sloan Leiter, PharmD, BCPS Clinical  Pharmacist [phone] 03/23/2014, 5:50 PM

## 2014-03-23 NOTE — ED Provider Notes (Signed)
CSN: [id number]     Arrival date & time 03/23/14  1425 History   First MD Initiated Contact with Patient 03/23/14 1440     Chief Complaint  Patient presents with  . Fever     (Consider location/radiation/quality/duration/timing/severity/associated sxs/prior Treatment) HPI Comments: Patient from nursing home with fever to 105 and decreased breath sounds in the past day. One episode of vomiting at home. 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 Per history presented to ED w/ 2 d h/o cough, then developed fever >103 on 1/9. Recently switched from imipenem to PO levaquin.  On PO vancomycin for ongoing C dif infection as well.   The history is provided by a relative, the EMS personnel and the patient. The history is limited by the condition of the patient.    Past Medical History  Diagnosis Date  . Hypertension   . BPH (benign prostatic hyperplasia)   . Hyperlipidemia   . Cancer 2008-2009    prostate TREATED WITH RADIATION  . AAA (abdominal aortic aneurysm) 04/2012    STENTING OF AAA IN [place]  . Junctional cardiac arrhythmia     Occurred postoperatively after urologic surgery  . Severe sinus bradycardia     Occurred postoperatively after urologic surgery  . Aortic valve disorders   . Other primary cardiomyopathies   . Dysphagia   . Status post insertion of percutaneous endoscopic gastrostomy (PEG) tube 02/11/14  . Bacteremia due to Pseudomonas   . Infected aortic graft    Past Surgical History  Procedure Laterality Date  . Abdominal surgery      PART OF COLON REMOVED FOR DIVERTICULITIS  . Appendectomy    . Hernia repair    . Bladder surgery  2008    FOR BLADDER STONE  .  Total knee arthroplasty  1990    left  . Abdominal aortic aneurysm repair  04/2012  . Total knee arthroplasty Right 07/26/2012    Procedure: RIGHT TOTAL KNEE ARTHROPLASTY;  Surgeon: Tobi Bastos, MD;  Location: [facility];  Service: Orthopedics;  Laterality: Right;  . Nephrolithotomy Left 04/26/2013    Procedure: LEFT PERCUTANEOUS NEPHROLITHOTOMY ;  Surgeon: [name], MD;  Location: [facility];  Service: Urology;  Laterality: Left;  . Nephrolithotomy Left 05/03/2013    Procedure: 2ND STAGE LEFT PERCUTANEOUS NEPHROLITHOTOMY ;  Surgeon: [name], MD;  Location: [facility];  Service: Urology;  Laterality: Left;  . Esophagogastroduodenoscopy N/A 02/11/2014    PFX:TKWIO hiatal hernia. Patchy erythema of the gastric mucosa; otherwise negative EGD. Status post 35 French Microvasive PEG tube placement  . Peg placement N/A 02/11/2014    Procedure: PERCUTANEOUS ENDOSCOPIC GASTROSTOMY (PEG) PLACEMENT;  Surgeon: Daneil Dolin, MD;  Location: [facility];  Service: Endoscopy;  Laterality: N/A;   Family History  Problem Relation Age of Onset  . Lung cancer Father    History  Substance Use Topics  . Smoking status: Former Smoker -- 2.00 packs/day for 37 years    Types: Cigarettes    Start date: 02/17/1949    Quit date: 03/15/1985  . Smokeless tobacco: Never Used     Comment: quit 25 years ago  . Alcohol Use: No    Review of  Systems  Unable to perform ROS: Acuity of condition  Constitutional: Positive for fever.      Allergies  Atorvastatin and Penicillins  Home Medications   Prior to Admission medications   Medication Sig Start Date End Date Taking? Authorizing Provider  acetaminophen (TYLENOL) 500 MG tablet Take 1,000 mg by mouth every 6 (six) hours as needed for mild pain or moderate pain.   Yes Historical Provider, MD  albuterol (PROVENTIL) (2.5 MG/3ML) 0.083% nebulizer solution Take 3 mLs (2.5 mg total) by nebulization every 2 (two) hours as needed for wheezing or shortness of breath.  02/28/14  Yes Marianne L York, PA-C  Alum & Mag Hydroxide-Simeth (MAGIC MOUTHWASH) SOLN Take 5 mLs by mouth 4 (four) times daily. 10 day regimen started 03/22/14   Yes Historical Provider, MD  Amino Acids-Protein Hydrolys (FEEDING SUPPLEMENT, PRO-STAT SUGAR FREE 64,) LIQD Place 30 mLs into feeding tube 3 (three) times daily. 02/13/14  Yes Erline Hau, MD  docusate sodium (COLACE) 100 MG capsule Take 100 mg by mouth daily as needed for mild constipation.   Yes Historical Provider, MD  doxazosin (CARDURA) 4 MG tablet Take 4 mg by mouth at bedtime.   Yes Historical Provider, MD  famotidine (PEPCID) 40 MG/5ML suspension Place 5 mLs (40 mg total) into feeding tube daily. 03/01/14  Yes Bobby Rumpf York, PA-C  levofloxacin (LEVAQUIN) 750 MG tablet Please resume oral levaquin when IV Meropenem is complete on 12/25.  Continue indefintely for suppression of infection.  This medication will be managed by Infectious Disease at [facility]. 02/28/14  Yes Bobby Rumpf York, PA-C  LORazepam (ATIVAN) 2 MG/ML concentrated solution Take 0.1 mLs (0.2 mg total) by mouth every 6 (six) hours as needed for anxiety. 02/28/14  Yes Bobby Rumpf York, PA-C  methylphenidate (RITALIN) 5 MG tablet Take 1 tablet (5 mg total) by mouth daily. 03/21/14  Yes Tiffany L Reed, DO  metoprolol succinate (TOPROL-XL) 25 MG 24 hr tablet Take 25 mg by mouth daily.   Yes Historical Provider, MD  mirtazapine (REMERON) 30 MG tablet Take 30 mg by mouth at bedtime.   Yes Historical Provider, MD  Nutritional Supplements (FEEDING SUPPLEMENT, JEVITY 1.2 CAL,) LIQD Place 1,000 mLs into feeding tube continuous. 02/13/14  Yes Erline Hau, MD  rifampin (RIFADIN) 300 MG capsule Take 300 mg by mouth every 12 (twelve) hours.   Yes Historical Provider, MD  simvastatin (ZOCOR) 20 MG tablet Take 20 mg by mouth daily.   Yes Historical Provider, MD  traMADol (ULTRAM) 50 MG tablet Take 1 tablet (50 mg total) by mouth every 6 (six) hours as needed for  moderate pain. 02/28/14  Yes Bobby Rumpf York, PA-C  triamcinolone cream (KENALOG) 0.1 % Apply 1 application topically 2 (two) times daily as needed (rash).   Yes Historical Provider, MD  vancomycin (VANCOCIN) 50 mg/mL oral solution For C-Diff.  Continue indefinitely until patient is seen in follow up at [facility]. (as the patient will be on continuous Levaquin). 02/28/14  Yes Bobby Rumpf York, PA-C  Water For Irrigation, Sterile (FREE WATER) SOLN Place 100 mLs into feeding tube every 8 (eight) hours. 02/28/14  Yes Marianne L York, PA-C   BP 108/56 mmHg  Pulse 38  Temp(Src) 100.2 F (37.9 C) (Rectal)  Resp 25  Ht 6\' 2"  (1.88 m)  Wt 213 lb 10 oz (96.9 kg)  BMI 27.42 kg/m2  SpO2 100% Physical Exam  Constitutional: He is oriented to person, place, and time. He appears well-developed  and well-nourished. He appears distressed.  HENT:  Head: Normocephalic and atraumatic.  Mouth/Throat: Oropharynx is clear and moist. No oropharyngeal exudate.  Dry mucus membranes  Eyes: Conjunctivae and EOM are normal. Pupils are equal, round, and reactive to light.  Neck: Normal range of motion. Neck supple.  Cardiovascular: Normal rate and normal heart sounds.   tachycardic  Pulmonary/Chest: Effort normal and breath sounds normal.  Decreased breath sounds at bases  Abdominal: Soft. There is no tenderness. There is no rebound and no guarding.  PEG in place  Musculoskeletal: Normal range of motion. He exhibits no edema or tenderness.  Neurological: He is alert and oriented to person, place, and time. No cranial nerve deficit. He exhibits normal muscle tone. Coordination normal.  Moving all extremities.  Skin: Skin is warm.    ED Course  Procedures (including critical care time) Labs Review Labs Reviewed  CBC WITH DIFFERENTIAL - Abnormal; Notable for the following:    RBC 3.42 (*)    Hemoglobin 9.2 (*)    HCT 28.2 (*)    RDW 19.1 (*)    Platelets 113 (*)    Neutrophils Relative % 83 (*)     Lymphocytes Relative 7 (*)    Lymphs Abs 0.6 (*)    All other components within normal limits  COMPREHENSIVE METABOLIC PANEL - Abnormal; Notable for the following:    Glucose, Bld 112 (*)    Total Protein 5.8 (*)    Albumin 2.3 (*)    GFR calc non Af Amer 48 (*)    GFR calc Af Amer 55 (*)    All other components within normal limits  URINALYSIS, ROUTINE W REFLEX MICROSCOPIC - Abnormal; Notable for the following:    Color, Urine AMBER (*)    APPearance HAZY (*)    Hgb urine dipstick LARGE (*)    Ketones, ur 15 (*)    Leukocytes, UA TRACE (*)    All other components within normal limits  PROTIME-INR - Abnormal; Notable for the following:    Prothrombin Time 15.7 (*)    All other components within normal limits  URINE MICROSCOPIC-ADD ON - Abnormal; Notable for the following:    Bacteria, UA FEW (*)    All other components within normal limits  CULTURE, BLOOD (ROUTINE X 2)  CULTURE, BLOOD (ROUTINE X 2)  URINE CULTURE  MRSA PCR SCREENING  CBC  COMPREHENSIVE METABOLIC PANEL  I-STAT CG4 LACTIC ACID, ED  I-STAT TROPOININ, ED  I-STAT CG4 LACTIC ACID, ED    Imaging Review Dg Chest Port 1 View  03/23/2014   CLINICAL DATA:  Initial evaluation for fever of 103 degrees in decreased lungs sounds  EXAM: PORTABLE CHEST - 1 VIEW  COMPARISON:  02/21/2014  FINDINGS: Cardiac enlargement stable. Descending thoracic aortic stent graft stable. Vascular pattern otherwise normal.  Mild patchy infiltrate in the lingula.  Right lung appears clear.  IMPRESSION: Lingular infiltrate concerning for pneumonia.   Electronically Signed   By: Skipper Cliche M.D.   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  Code sepsis called on arrival.   IVF, antibiotics, cultures.  Patient had been on meropenem and rifampin previously.  Ordered vancomycin and meropenem.  EF 40% last year.  Family states he is DNI. They would want CPR and are not certain about central line or vasopressors.  CXR with likely HCAP.  Lactate and WBC reassuring.  BP with MAP 65 after 3 liters.  HR improved.  PCCM at bedside to admit.  They have discussed with family limited options. Dr. Lamonte Sakai does not recommend vasopressors or life support.   CRITICAL CARE Performed by: Ezequiel Essex Total critical care time: 35 Critical care time was exclusive of separately billable procedures and treating other patients. Critical care was necessary to treat or prevent imminent or life-threatening deterioration. Critical care was time spent personally by me on the following activities: development of treatment plan with patient and/or surrogate as well as nursing, discussions with consultants, evaluation of patient's response to treatment, examination of patient, obtaining history from patient or surrogate, ordering and performing treatments and interventions, ordering and review of laboratory studies, ordering and review of radiographic studies, pulse oximetry and re-evaluation of patient's condition.    Ezequiel Essex, MD 03/23/14 1944

## 2014-03-23 NOTE — ED Notes (Signed)
From [facility]: EMS called for fever of 103 and decreased lung sounds. Reported vomited x1 this morning, given phenergan suppository and no more vomiting. BP 100/60 and ST 140 on EMS arrival, 500cc NS bolus started by EMS - about 400cc infused at this time. HR decreased to 115. Patient is alert and oriented x4.

## 2014-03-24 ENCOUNTER — Inpatient Hospital Stay (HOSPITAL_COMMUNITY): Payer: Medicare Other

## 2014-03-24 LAB — CBC
HCT: 22.8 % — ABNORMAL LOW (ref 39.0–52.0)
Hemoglobin: 7.3 g/dL — ABNORMAL LOW (ref 13.0–17.0)
MCH: 27.8 pg (ref 26.0–34.0)
MCHC: 32 g/dL (ref 30.0–36.0)
MCV: 86.7 fL (ref 78.0–100.0)
Platelets: 99 10*3/uL — ABNORMAL LOW (ref 150–400)
RBC: 2.63 MIL/uL — AB (ref 4.22–5.81)
RDW: 19.6 % — ABNORMAL HIGH (ref 11.5–15.5)
WBC: 6.5 10*3/uL (ref 4.0–10.5)

## 2014-03-24 LAB — COMPREHENSIVE METABOLIC PANEL
ALK PHOS: 45 U/L (ref 39–117)
ALT: 8 U/L (ref 0–53)
ANION GAP: 4 — AB (ref 5–15)
AST: 23 U/L (ref 0–37)
Albumin: 1.9 g/dL — ABNORMAL LOW (ref 3.5–5.2)
BILIRUBIN TOTAL: 0.4 mg/dL (ref 0.3–1.2)
BUN: 20 mg/dL (ref 6–23)
CO2: 25 mmol/L (ref 19–32)
CREATININE: 1.17 mg/dL (ref 0.50–1.35)
Calcium: 8 mg/dL — ABNORMAL LOW (ref 8.4–10.5)
Chloride: 111 mEq/L (ref 96–112)
GFR calc Af Amer: 65 mL/min — ABNORMAL LOW (ref >90)
GFR, EST NON AFRICAN AMERICAN: 56 mL/min — AB (ref >90)
Glucose, Bld: 101 mg/dL — ABNORMAL HIGH (ref 70–99)
Potassium: 3.6 mmol/L (ref 3.5–5.1)
SODIUM: 140 mmol/L (ref 135–145)
Total Protein: 4.9 g/dL — ABNORMAL LOW (ref 6.0–8.3)

## 2014-03-24 LAB — CLOSTRIDIUM DIFFICILE BY PCR: CDIFFPCR: NEGATIVE

## 2014-03-24 MED ORDER — ACETAMINOPHEN 160 MG/5ML PO SOLN
650.0000 mg | Freq: Four times a day (QID) | ORAL | Status: DC | PRN
  Filled 2014-03-24: qty 20.3

## 2014-03-24 NOTE — Clinical Social Work Psychosocial (Signed)
     Clinical Social Work Department BRIEF PSYCHOSOCIAL ASSESSMENT 03/24/2014  Patient:  [name], [name]     Account Number:  [id number]     Admit date:  03/23/2014  Clinical Social Worker:  Adair Laundry  Date/Time:  03/24/2014 12:48 PM  Referred by:  Physician  Date Referred:  03/24/2014 Referred for  SNF Placement   Other Referral:   Interview type:  Patient Other interview type:   Spoke with pt but pt sister at bedside    PSYCHOSOCIAL DATA Living Status:  FACILITY Admitted from facility:  [place] Level of care:  [place] Primary support name:  Ryo Klang Primary support relationship to patient:  CHILD, ADULT Degree of support available:   Pt has strong family support    CURRENT CONCERNS Current Concerns  Post-Acute Placement   Other Concerns:    SOCIAL WORK ASSESSMENT / PLAN CSW became aware that pt admitted from SNF during chart review. CSW visited pt room and spoke with pt who was laying in bed talking with sister at bedside. Pt informed CSW he has been at [facility] for about 2 months now. Pt reports no questions or concerns and stated that he plans to return to SNF at dc. CSW explained role to pt and pt family. Pt in good spirits during assessment and expressed he will be ready to return to SNF as soon as he is able.   Assessment/plan status:  Psychosocial Support/Ongoing Assessment of Needs Other assessment/ plan:   Information/referral to community resources:   none needed at this time    PATIENTS/FAMILYS RESPONSE TO PLAN OF CARE: Pt pleasant and coopeartive. Pt wanting to return to SNF at discharge.      [place], [place]

## 2014-03-24 NOTE — Progress Notes (Signed)
Notified Md about pt's hgb 7.3. No active bleeding noted. No new orders at this time.  Will continue to monitor. Saunders Revel T

## 2014-03-24 NOTE — Progress Notes (Signed)
[facility] TEAM 1 - Stepdown/ICU TEAM Progress Note  [name]:[id number] DOB: April 27, 1929 DOA: 03/23/2014 PCP: [name], MD  Admit HPI / Brief Narrative: 81yo male with 10 hospitalizations since endovascular repair of thoracoabdominal aortic aneurysm in 09/2013, initially for repairs of endovascular leak and Pseudomonal sepsis 2/2 endovascular graft infection, but more recently for failure to thrive s/p PEG tube, who was sent from [place] for ID followup. 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 This admit he presented to ED w/ a 2 day h/o cough, and a fever >103. EMS was called, and found him hypotensive and tachycardic.  HPI/Subjective: Pt denies specific complaints at this time.  He is resting comfortably.    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 rate 28, height 6\' 2"  (1.88 m), weight 98.5 kg (217 lb 2.5 oz), SpO2 96 %.  Intake/Output Summary (Last 24 hours) at 03/24/14 0942 Last data filed at 03/24/14 0600  Gross per 24 hour  Intake 2568.76 ml  Output    338 ml  Net 2230.76 ml   Exam: General: No acute respiratory distress at rest in bed  Lungs: bibasilar crackles L > R - no wheeze  Cardiovascular: Regular rate and rhythm without murmur gallop or rub  Abdomen: Nontender, nondistended, soft, bowel sounds positive, no ascites, no appreciable mass - PEG insertion site ~midline and clean/dry Extremities: No significant cyanosis, clubbing;  2+ chronic appearing edema L LE   Data Reviewed: Basic Metabolic Panel:  Recent Labs Lab 03/23/14 1455 03/24/14 0319  NA 136 140  K 3.6 3.6  CL 107 111  CO2 24 25  GLUCOSE 112* 101*  BUN 21 20  CREATININE 1.34 1.17  CALCIUM 8.6 8.0*   Liver Function Tests:  Recent  Labs Lab 03/23/14 1455 03/24/14 0319  AST 24 23  ALT 9 8  ALKPHOS 51 45  BILITOT 0.6 0.4  PROT 5.8* 4.9*  ALBUMIN 2.3* 1.9*    Coags:  Recent Labs Lab 03/23/14 1455  INR 1.23   CBC:  Recent Labs Lab 03/23/14 1455 03/24/14 0319  WBC 8.2 6.5  NEUTROABS 6.8  --   HGB 9.2* 7.3*  HCT 28.2* 22.8*  MCV 82.5 86.7  PLT 113* 99*   CBG: No results for input(s): GLUCAP in the last 168 hours.  Recent Results  (from the past 240 hour(s))  MRSA PCR Screening     Status: None   Collection Time: 03/23/14  7:16 PM  Result Value Ref Range Status   MRSA by PCR NEGATIVE NEGATIVE Final    Comment:        The GeneXpert MRSA Assay (FDA approved for NASAL specimens only), is one component of a comprehensive MRSA colonization surveillance program. It is not intended to diagnose MRSA infection nor to guide or monitor treatment for MRSA infections.      Studies:  Recent x-ray studies have been reviewed in detail by the Attending Physician  Scheduled Meds:  Scheduled Meds: . famotidine  40 mg Per Tube Daily  . free water  100 mL Per Tube 3 times per day  . heparin  5,000 Units Subcutaneous 3 times per day  . meropenem (MERREM) IV  1 g Intravenous Q12H  . vancomycin  500 mg Per Tube 4 times per day  . vancomycin  750 mg Intravenous Q12H    Time spent on care of this patient: 35 mins   Teresa Nicodemus T , MD   [facility] Office  [phone] Pager - Text Page per Shea Evans as per below:  On-Call/Text Page:      [email or web address]      password [email or web address]  If 7PM-7AM, please contact night-coverage [email or web address] Password [email or web address] 03/24/2014, 9:42 AM   LOS: 1 day

## 2014-03-25 LAB — COMPREHENSIVE METABOLIC PANEL
ALBUMIN: 1.9 g/dL — AB (ref 3.5–5.2)
ALK PHOS: 51 U/L (ref 39–117)
ALT: 8 U/L (ref 0–53)
AST: 17 U/L (ref 0–37)
Anion gap: 4 — ABNORMAL LOW (ref 5–15)
BUN: 16 mg/dL (ref 6–23)
CHLORIDE: 110 meq/L (ref 96–112)
CO2: 24 mmol/L (ref 19–32)
Calcium: 8.2 mg/dL — ABNORMAL LOW (ref 8.4–10.5)
Creatinine, Ser: 0.94 mg/dL (ref 0.50–1.35)
GFR calc Af Amer: 88 mL/min — ABNORMAL LOW (ref >90)
GFR calc non Af Amer: 76 mL/min — ABNORMAL LOW (ref >90)
Glucose, Bld: 112 mg/dL — ABNORMAL HIGH (ref 70–99)
Potassium: 3.4 mmol/L — ABNORMAL LOW (ref 3.5–5.1)
SODIUM: 138 mmol/L (ref 135–145)
Total Bilirubin: 0.3 mg/dL (ref 0.3–1.2)
Total Protein: 4.9 g/dL — ABNORMAL LOW (ref 6.0–8.3)

## 2014-03-25 LAB — CBC
HCT: 24.6 % — ABNORMAL LOW (ref 39.0–52.0)
HEMOGLOBIN: 7.9 g/dL — AB (ref 13.0–17.0)
MCH: 27.1 pg (ref 26.0–34.0)
MCHC: 32.1 g/dL (ref 30.0–36.0)
MCV: 84.2 fL (ref 78.0–100.0)
Platelets: 98 10*3/uL — ABNORMAL LOW (ref 150–400)
RBC: 2.92 MIL/uL — AB (ref 4.22–5.81)
RDW: 20 % — AB (ref 11.5–15.5)
WBC: 5.3 10*3/uL (ref 4.0–10.5)

## 2014-03-25 LAB — URINE CULTURE
CULTURE: NO GROWTH
Colony Count: NO GROWTH

## 2014-03-25 MED ORDER — MIRTAZAPINE 30 MG PO TABS: 30.0000 mg | ORAL_TABLET | Freq: Every day | ORAL | Status: DC

## 2014-03-25 MED ORDER — DOXAZOSIN MESYLATE 4 MG PO TABS
4.0000 mg | ORAL_TABLET | Freq: Every day | ORAL | Status: DC
  Administered 2014-03-25 – 2014-03-26 (×2): 4 mg via ORAL
  Filled 2014-03-25 (×4): qty 1

## 2014-03-25 MED ORDER — LORAZEPAM 2 MG/ML PO CONC: 0.2500 mg | Freq: Four times a day (QID) | ORAL | Status: DC | PRN

## 2014-03-25 MED ORDER — METHYLPHENIDATE HCL 5 MG PO TABS
5.0000 mg | ORAL_TABLET | Freq: Every day | ORAL | Status: DC
  Administered 2014-03-25 – 2014-03-27 (×3): 5 mg
  Filled 2014-03-25 (×3): qty 1

## 2014-03-25 MED ORDER — METHYLPHENIDATE HCL 5 MG PO TABS: 5.0000 mg | ORAL_TABLET | Freq: Every day | ORAL | Status: DC

## 2014-03-25 MED ORDER — LORAZEPAM 0.5 MG PO TABS: 0.2500 mg | ORAL_TABLET | Freq: Four times a day (QID) | ORAL | Status: DC | PRN

## 2014-03-25 MED ORDER — MIRTAZAPINE 30 MG PO TABS
30.0000 mg | ORAL_TABLET | Freq: Every day | ORAL | Status: DC
  Administered 2014-03-25 – 2014-03-27 (×2): 30 mg
  Filled 2014-03-25 (×5): qty 1

## 2014-03-25 MED ORDER — POTASSIUM CHLORIDE 20 MEQ/15ML (10%) PO SOLN
20.0000 meq | Freq: Two times a day (BID) | ORAL | Status: AC
  Administered 2014-03-25 – 2014-03-26 (×3): 20 meq
  Filled 2014-03-25 (×3): qty 15

## 2014-03-25 MED ORDER — PRO-STAT SUGAR FREE PO LIQD
30.0000 mL | Freq: Three times a day (TID) | ORAL | Status: DC
  Administered 2014-03-25 – 2014-03-27 (×8): 30 mL via ORAL
  Filled 2014-03-25 (×8): qty 30

## 2014-03-25 NOTE — Progress Notes (Signed)
CSW (Clinical Education officer, museum) provided handoff to new unit CSW. This CSW is signing off.  [place], [place]

## 2014-03-25 NOTE — Progress Notes (Signed)
INITIAL NUTRITION ASSESSMENT  DOCUMENTATION CODES Per approved criteria  -Not Applicable   INTERVENTION: -Jevity 1.2 @ 60 ml/hr via PEG. 100 ml free water flush every 8 hours  -Add 30 ml Prostat TID.    Tube feeding regimen provides 2028 kcal (100% of needs), 120 grams of protein, and 1462 ml of H2O.   NUTRITION DIAGNOSIS: Inadequate oral intake related to inability to eat as evidenced by NPO.   Goal: Pt will meet >90% of estimated nutritional needs  Monitor:  TF tolerance, labs, weight changes, I/O's  Reason for Assessment: MST=2, TF  81 y.o. male  Admitting Dx: <principal problem not specified>  81yo male with 10 hospitalizations since endovascular repair of thoracoabdominal aortic aneurysm in 09/2013, initially for repairs of endovascular leak and Pseudomonal sepsis 2/2 endovascular graft infection, but more recently for failure to thrive s/p PEG tube, who was sent from [place] for ID followup. 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 This admit he presented to ED w/ a 2 day h/o cough, and a fever >103. EMS was called, and found him hypotensive and tachycardic.  ASSESSMENT: Pt admitted with septic shock. He is a resident on [facility].  Pt s/p PEG placement in 01/2014 due to progressive wt loss and poor PO intake.  Pt currently receiving Jevity 1.2 @ 60 ml/hr, per home regimen. This provides 1728 kcals, 80 grams protein, 1162 ml fluid daily (1462 ml with free water flushes of 100 ml every 8 hours), which meets 86% of minimum estimated kcal needs 67% of estimated minimum protein needs. Noted that pt also received 30 ml Prostat TID per home regimen (which would provide 2028 kcals and 120 grams protein in addition to feeding).  Noted modest wt gain x 1 month, which is  favorable, due to pt hx of FTT prior to PEG placement.  Nutrition-focused physical exam deferred at this time, due to multiple nurse techs repositioning pt at time of visit. Noted transfer order to medical floor.  Labs reviewed. K: 3.4, Calcium: 8.2, Glucose: 112.   Height: Ht Readings from Last 1 Encounters:  03/23/14 6\' 2"  (1.88 m)    Weight: Wt Readings from Last 1 Encounters:  03/25/14 221 lb 5.5 oz (100.4 kg)    Ideal Body Weight: 190#  % Ideal Body Weight: 116%  Wt Readings from Last 10 Encounters:  03/25/14 221 lb 5.5 oz (100.4 kg)  03/01/14 216 lb 7.9 oz (98.2 kg)  02/13/14 219 lb 12.8 oz (99.7 kg)  01/11/14 240 lb (108.863 kg)  01/02/14 240 lb (108.863 kg)  10/31/13 256 lb (116.121 kg)  08/03/13 280 lb 12.8 oz (127.37 kg)  07/20/13 277 lb (125.646 kg)  07/05/13 265 lb 14.4 oz (120.611 kg)  06/29/13 277 lb (125.646 kg)    Usual Body Weight: 286#  % Usual Body Weight: 77%  BMI:  Body mass index is 28.41 kg/(m^2). Overweight  Estimated Nutritional Needs: Kcal: 2000-2200 kcals Protein: 120-130 grams Fluid: 2.0-2.2 L  Skin: WDL  Diet Order:  NPO  EDUCATION NEEDS: -Education not appropriate at this time   Intake/Output Summary (Last 24 hours) at 03/25/14 1016 Last data filed at 03/25/14 0800  Gross per 24 hour  Intake 4012.5 ml  Output   1590 ml  Net 2422.5 ml    Last BM:  03/26/14  Labs:   Recent Labs Lab 03/23/14 1455 03/24/14 0319 03/25/14 0210  NA 136 140 138  K 3.6 3.6 3.4*  CL 107 111 110  CO2 24 25 24   BUN 21 20 16   CREATININE 1.34 1.17 0.94  CALCIUM 8.6 8.0* 8.2*  GLUCOSE 112* 101* 112*    CBG (last 3)  No results for input(s): GLUCAP in the last 72 hours.  Scheduled Meds: . doxazosin  4 mg Oral QHS  . famotidine  40 mg Per Tube Daily  . free water  100 mL Per Tube 3 times per day  . heparin  5,000 Units Subcutaneous 3 times per day  . meropenem (MERREM) IV  1 g Intravenous Q12H  . methylphenidate  5 mg Per Tube Daily  .  mirtazapine  30 mg Per Tube QHS  . potassium chloride  20 mEq Per Tube BID  . vancomycin  500 mg Per Tube 4 times per day  . vancomycin  750 mg Intravenous Q12H    Continuous Infusions: . sodium chloride 10 mL/hr at 03/25/14 0928  . feeding supplement (JEVITY 1.2 CAL) 1,000 mL (03/24/14 2000)    Past Medical History  Diagnosis Date  . Hypertension   . BPH (benign prostatic hyperplasia)   . Hyperlipidemia   . Cancer 2008-2009    prostate TREATED WITH RADIATION  . AAA (abdominal aortic aneurysm) 04/2012    STENTING OF AAA IN [place]  . Junctional cardiac arrhythmia     Occurred postoperatively after urologic surgery  . Severe sinus bradycardia     Occurred postoperatively after urologic surgery  . Aortic valve disorders   . Other primary cardiomyopathies   . Dysphagia   . Status post insertion of percutaneous endoscopic gastrostomy (PEG) tube 02/11/14  . Bacteremia due to Pseudomonas   . Infected aortic graft     Past Surgical History  Procedure Laterality Date  . Abdominal surgery      PART OF COLON REMOVED FOR DIVERTICULITIS  . Appendectomy    . Hernia repair    . Bladder surgery  2008    FOR BLADDER STONE  . Total knee arthroplasty  1990    left  . Abdominal aortic aneurysm repair  04/2012  . Total knee arthroplasty Right 07/26/2012    Procedure: RIGHT TOTAL KNEE ARTHROPLASTY;  Surgeon: Tobi Bastos, MD;  Location: [facility];  Service: Orthopedics;  Laterality: Right;  . Nephrolithotomy Left 04/26/2013    Procedure: LEFT PERCUTANEOUS NEPHROLITHOTOMY ;  Surgeon: [name], MD;  Location: [facility];  Service: Urology;  Laterality: Left;  . Nephrolithotomy Left 05/03/2013    Procedure: 2ND STAGE LEFT PERCUTANEOUS NEPHROLITHOTOMY ;  Surgeon: [name], MD;  Location: [facility];  Service: Urology;  Laterality: Left;  . Esophagogastroduodenoscopy N/A 02/11/2014    IHW:TUUEK hiatal hernia. Patchy erythema of the gastric mucosa; otherwise negative EGD. Status post 52 French  Microvasive PEG tube placement  . Peg placement N/A 02/11/2014    Procedure: PERCUTANEOUS ENDOSCOPIC GASTROSTOMY (PEG) PLACEMENT;  Surgeon: Daneil Dolin, MD;  Location: [facility];  Service: Endoscopy;  Laterality: N/A;    Pa Tennant A. Jimmye Norman, RD, LDN, CDE Pager: [phone] After hours Pager: [phone]

## 2014-03-25 NOTE — Progress Notes (Signed)
[facility] TEAM 1 - Stepdown/ICU TEAM Progress Note  [name]:[id number] DOB: 04-19-1929 DOA: 03/23/2014 PCP: [name], MD  Admit HPI / Brief Narrative: 81yo male with 10 hospitalizations since endovascular repair of thoracoabdominal aortic aneurysm in 09/2013, initially for repairs of endovascular leak and Pseudomonal sepsis 2/2 endovascular graft infection, but more recently for failure to thrive s/p PEG tube, who was sent from [place] for ID followup. 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 This admit he presented to ED w/ a 2 day h/o cough, and a fever >103. EMS was called, and found him hypotensive and tachycardic.  HPI/Subjective: Pt is sleeping but awakens to deny any complaints.  He denies cp, sob, n/v, or abdom pain.  There if no family present at the time of my exam.    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  Transitioned from carbapenem to levaquin by ID at [facility] in beginning of Dec 2015 - ID at [facility] has recommended Palliative Care - cont current tx w/ ultimate plan for f/u at [facility] if he does well   Hematuria in the setting of hx of prostate CA and radiation therapy A recurring issue - previously required continuous bladder irrigation per Urology - recently restarted ASA + Plavix - stop asa and plavix again and follow   C diff 12/16 PCR negative at this time but prior plan was to continue "indefinite" oral vanc until recheck by ID at [facility] (Dr. 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 grade 1 diastolic congestive heart failure Is not currently volume overloaded  CKD Stage III Baseline renal function BUN 26 creatinine 1.41 - crt currently better that his baseline   Thrombocytopenia Plt count stable at this time   History of DVT of lower extremity (Feb 2015) - post IVC filter  Hypokalemia Replace - check Mg in AM   Code Status: NO CODE - DNR Family Communication: no family present at time of exam Disposition Plan: stable for transfer to medical bed - cont empiric abx - f/u culture data - PT/OT   Consultants: PCCM  Procedures: none  Antibiotics: Meropenem 1/9 > IV vanc 1/9 > Oral vanc 1/9 >  DVT prophylaxis: SCDs + prior IVC filter  Objective: Blood pressure 138/72, pulse 76, temperature 97.5 F (36.4 C), temperature source Oral, resp. rate 12, height 6\' 2"  (1.88 m), weight 100.4 kg (221 lb 5.5 oz), SpO2 100 %.  Intake/Output Summary (Last 24 hours) at 03/25/14 0859 Last data filed at 03/25/14 0800  Gross per 24 hour  Intake 4012.5 ml  Output   1590 ml  Net 2422.5 ml   Exam: General: No acute respiratory distress at rest in bed  Lungs: bibasilar crackles L > R - no wheeze  Cardiovascular: Regular rate and rhythm  without murmur gallop or rub  Abdomen: Nontender, nondistended, soft, bowel sounds positive, no ascites, no appreciable mass - PEG insertion site ~midline and clean/dry Extremities: No significant cyanosis, clubbing;  1+ chronic appearing edema L LE   Data Reviewed: Basic Metabolic Panel:  Recent Labs Lab 03/23/14 1455 03/24/14 0319 03/25/14 0210  NA 136 140 138  K 3.6 3.6 3.4*  CL 107 111 110  CO2 24 25 24   GLUCOSE 112* 101* 112*  BUN 21 20 16   CREATININE 1.34 1.17 0.94    CALCIUM 8.6 8.0* 8.2*   Liver Function Tests:  Recent Labs Lab 03/23/14 1455 03/24/14 0319 03/25/14 0210  AST 24 23 17   ALT 9 8 8   ALKPHOS 51 45 51  BILITOT 0.6 0.4 0.3  PROT 5.8* 4.9* 4.9*  ALBUMIN 2.3* 1.9* 1.9*    Coags:  Recent Labs Lab 03/23/14 1455  INR 1.23   CBC:  Recent Labs Lab 03/23/14 1455 03/24/14 0319 03/25/14 0210  WBC 8.2 6.5 5.3  NEUTROABS 6.8  --   --   HGB 9.2* 7.3* 7.9*  HCT 28.2* 22.8* 24.6*  MCV 82.5 86.7 84.2  PLT 113* 99* 98*    Recent Results (from the past 240 hour(s))  Blood Culture (routine x 2)     Status: None (Preliminary result)   Collection Time: 03/23/14  2:41 PM  Result Value Ref Range Status   Specimen Description BLOOD RIGHT ARM  Final   Special Requests BOTTLES DRAWN AEROBIC AND ANAEROBIC 5CC  Final   Culture   Final           BLOOD CULTURE RECEIVED NO GROWTH TO DATE CULTURE WILL BE HELD FOR 5 DAYS BEFORE ISSUING A FINAL NEGATIVE REPORT Performed at [facility]    Report Status PENDING  Incomplete  Blood Culture (routine x 2)     Status: None (Preliminary result)   Collection Time: 03/23/14  2:59 PM  Result Value Ref Range Status   Specimen Description BLOOD LEFT ARM  Final   Special Requests BOTTLES DRAWN AEROBIC AND ANAEROBIC 5CC  Final   Culture   Final           BLOOD CULTURE RECEIVED NO GROWTH TO DATE CULTURE WILL BE HELD FOR 5 DAYS BEFORE ISSUING A FINAL NEGATIVE REPORT Performed at [facility]    Report Status PENDING  Incomplete  MRSA PCR Screening     Status: None   Collection Time: 03/23/14  7:16 PM  Result Value Ref Range Status   MRSA by PCR NEGATIVE NEGATIVE Final    Comment:        The GeneXpert MRSA Assay (FDA approved for NASAL specimens only), is one component of a comprehensive MRSA colonization surveillance program. It is not intended to diagnose MRSA infection nor to guide or monitor treatment for MRSA infections.   Clostridium Difficile by PCR     Status: None    Collection Time: 03/23/14  9:06 PM  Result Value Ref Range Status   C difficile by pcr NEGATIVE NEGATIVE Final     Studies:  Recent x-ray studies have been reviewed in detail by the Attending Physician  Scheduled Meds:  Scheduled Meds: . famotidine  40 mg Per Tube Daily  . free water  100 mL Per Tube 3 times per day  . heparin  5,000 Units Subcutaneous 3 times per day  . meropenem (MERREM) IV  1 g Intravenous Q12H  . vancomycin  500 mg Per Tube 4 times per  day  . vancomycin  750 mg Intravenous Q12H    Time spent on care of this patient: 35 mins   MCCLUNG,JEFFREY T , MD   [facility] Office  [phone] Pager - Text Page per Shea Evans as per below:  On-Call/Text Page:      [email or web address]      password [email or web address]  If 7PM-7AM, please contact night-coverage [email or web address] Password [email or web address] 03/25/2014, 8:59 AM   LOS: 2 days

## 2014-03-25 NOTE — Evaluation (Signed)
Physical Therapy Evaluation Patient Details Name: [name] MRN: [id number] DOB: Jul 17, 1929 Today's Date: 03/25/2014   History of Present Illness  Pt is an 81 year old male w/ failure to thrive. Resides at SNF where he is being treated for chronic AAA pseudomonal graft infection and Cdiff. Followed at [facility]. Changed from carbapenem to oral Levaquin 12/10. Admitted on 1/9 to SDU w/ 2 d h/o cough, and fever. Admitted w/ working dx of new LLL HCAP +/- recurrent Pseudomonas infection w/ resultant sepsis.   Clinical Impression  Pt admitted with above diagnosis. Pt currently with functional limitations due to the deficits listed below (see PT Problem List). At the time of PT eval pt was declining all OOB mobility. Pt required peri-care as rectal tube was leaking, and was agreeable to bed mobility to replace pads and clean pt up. Pt will benefit from skilled PT to increase their independence and safety with mobility to allow discharge to the venue listed below.     Follow Up Recommendations SNF;Supervision/Assistance - 24 hour    Equipment Recommendations  None recommended by PT    Recommendations for Other Services       Precautions / Restrictions Precautions Precautions: Fall Precaution Comments: Rectal tube; Foley Restrictions Weight Bearing Restrictions: No      Mobility  Bed Mobility Overal bed mobility: Needs Assistance Bed Mobility: Rolling Rolling: Supervision         General bed mobility comments: Pt able to roll in bed L and R with heavy use of bed rails for assistance to complete peri-care. Pt refuses all other mobility with therapy.   Transfers                 General transfer comment: Pt refuses further mobility. "I can't stand up"  Ambulation/Gait                Stairs            Wheelchair Mobility    Modified Rankin (Stroke Patients Only)       Balance Overall balance assessment:  (NT)                                            Pertinent Vitals/Pain Pain Assessment: No/denies pain    Home Living Family/patient expects to be discharged to:: Skilled nursing facility                      Prior Function Level of Independence: Needs assistance   Gait / Transfers Assistance Needed: Pt had been at SNF and reports at times can ambulate ~15 feet. At other times is at a transfers only level.   ADL's / Homemaking Assistance Needed: Assist required for ADL's        Hand Dominance   Dominant Hand: Right    Extremity/Trunk Assessment   Upper Extremity Assessment: Defer to OT evaluation;Generalized weakness           Lower Extremity Assessment: Generalized weakness      Cervical / Trunk Assessment: Kyphotic  Communication   Communication: HOH (hearing aide present in the R ear only)  Cognition Arousal/Alertness: Awake/alert Behavior During Therapy: WFL for tasks assessed/performed Overall Cognitive Status: No family/caregiver present to determine baseline cognitive functioning                      General Comments  Exercises        Assessment/Plan    PT Assessment Patient needs continued PT services  PT Diagnosis Generalized weakness;Difficulty walking   PT Problem List Decreased strength;Decreased range of motion;Decreased activity tolerance;Decreased balance;Decreased mobility;Decreased knowledge of use of DME;Decreased safety awareness;Decreased knowledge of precautions  PT Treatment Interventions DME instruction;Gait training;Functional mobility training;Stair training;Therapeutic activities;Therapeutic exercise;Neuromuscular re-education;Patient/family education   PT Goals (Current goals can be found in the Care Plan section) Acute Rehab PT Goals Patient Stated Goal: None stated PT Goal Formulation: With patient Time For Goal Achievement: 04/08/14 Potential to Achieve Goals: Fair    Frequency Min 2X/week   Barriers to discharge         Co-evaluation               End of Session   Activity Tolerance: Other (comment) (Pt not agreeable to further mobility at this time. ) Patient left: in bed;with bed alarm set;with call bell/phone within reach;with nursing/sitter in room Nurse Communication: Mobility status;Need for lift equipment         Time: 4562-5638 PT Time Calculation (min) (ACUTE ONLY): 16 min   Charges:   PT Evaluation $Initial PT Evaluation Tier I: 1 Procedure PT Treatments $Therapeutic Activity: 8-22 mins   PT G Codes:        Rolinda Roan 27-Mar-2014, 3:09 PM   Rolinda Roan, PT, DPT Acute Rehabilitation Services Pager: [phone]

## 2014-03-25 NOTE — Progress Notes (Signed)
Patient arrived from [facility]. He denied any pain at this time. Appears in no distress. Safety precautions reviewed with patient. Patient is soiled upon arrival. [place] given per staffs. Will continue to monitor.   Ave Filter, RN

## 2014-03-25 NOTE — Care Management Note (Signed)
    Page 1 of 1   03/25/2014     8:48:03 AM CARE MANAGEMENT NOTE 03/25/2014  Patient:  [name], [name]   Account Number:  [id number]  Date Initiated:  03/25/2014  Documentation initiated by:  Elissa Hefty  Subjective/Objective Assessment:   adm w septic shock     Action/Plan:   has wife, was at nsg facility   Anticipated DC Date:     Anticipated DC Plan:    In-house referral  Clinical Social Worker         Choice offered to / List presented to:             Status of service:   Medicare Important Message given?   (If response is "NO", the following Medicare IM given date fields will be blank) Date Medicare IM given:   Medicare IM given by:   Date Additional Medicare IM given:   Additional Medicare IM given by:    Discharge Disposition:    Per UR Regulation:  Reviewed for med. necessity/level of care/duration of stay  If discussed at [place] of Stay Meetings, dates discussed:    Comments:  1/11 0846 debbie Leilyn Frayre rn,bsn had hhc arranged w [facility] but went to snf for rehab last adm.

## 2014-03-26 LAB — CBC
HCT: 26.4 % — ABNORMAL LOW (ref 39.0–52.0)
Hemoglobin: 8.5 g/dL — ABNORMAL LOW (ref 13.0–17.0)
MCH: 26.6 pg (ref 26.0–34.0)
MCHC: 32.2 g/dL (ref 30.0–36.0)
MCV: 82.8 fL (ref 78.0–100.0)
Platelets: 86 10*3/uL — ABNORMAL LOW (ref 150–400)
RBC: 3.19 MIL/uL — AB (ref 4.22–5.81)
RDW: 19.8 % — ABNORMAL HIGH (ref 11.5–15.5)
WBC: 5 10*3/uL (ref 4.0–10.5)

## 2014-03-26 LAB — COMPREHENSIVE METABOLIC PANEL
ALT: 8 U/L (ref 0–53)
ANION GAP: 5 (ref 5–15)
AST: 19 U/L (ref 0–37)
Albumin: 1.9 g/dL — ABNORMAL LOW (ref 3.5–5.2)
Alkaline Phosphatase: 49 U/L (ref 39–117)
BUN: 16 mg/dL (ref 6–23)
CO2: 20 mmol/L (ref 19–32)
Calcium: 8.1 mg/dL — ABNORMAL LOW (ref 8.4–10.5)
Chloride: 109 mEq/L (ref 96–112)
Creatinine, Ser: 0.81 mg/dL (ref 0.50–1.35)
GFR, EST NON AFRICAN AMERICAN: 81 mL/min — AB (ref >90)
Glucose, Bld: 113 mg/dL — ABNORMAL HIGH (ref 70–99)
Potassium: 3.9 mmol/L (ref 3.5–5.1)
Sodium: 134 mmol/L — ABNORMAL LOW (ref 135–145)
Total Bilirubin: 0.4 mg/dL (ref 0.3–1.2)
Total Protein: 5.1 g/dL — ABNORMAL LOW (ref 6.0–8.3)

## 2014-03-26 LAB — MAGNESIUM: Magnesium: 2 mg/dL (ref 1.5–2.5)

## 2014-03-26 MED ORDER — SODIUM CHLORIDE 0.9 % IV SOLN
INTRAVENOUS | Status: DC
  Administered 2014-03-26: 75 mL/h via INTRAVENOUS

## 2014-03-26 MED ORDER — METOPROLOL SUCCINATE ER 25 MG PO TB24
25.0000 mg | ORAL_TABLET | Freq: Every day | ORAL | Status: DC
  Administered 2014-03-27: 25 mg via ORAL
  Filled 2014-03-26 (×2): qty 1

## 2014-03-26 MED ORDER — SODIUM CHLORIDE 0.9 % IV BOLUS (SEPSIS)
250.0000 mL | Freq: Once | INTRAVENOUS | Status: AC
  Administered 2014-03-26: 250 mL via INTRAVENOUS

## 2014-03-26 NOTE — Progress Notes (Signed)
[place] TEAM 1 - Stepdown/ICU TEAM Progress Note  [name] KZS:[id number] DOB: 14-Sep-1929 DOA: 03/23/2014 PCP: [name], MD  Admit HPI / Brief Narrative: 81yo male with 10 hospitalizations since endovascular repair of thoracoabdominal aortic aneurysm in 09/2013, initially for repairs of endovascular leak and Pseudomonal sepsis 2/2 endovascular graft infection, but more recently for failure to thrive s/p PEG tube, who was sent from [facility] for ID followup. 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 This admit he presented to ED w/ a 2 day h/o cough, and a fever >103. EMS was called, and found him hypotensive and tachycardic.  HPI/Subjective: Patient alert following command.  Interactive. Hard of hearing.  This morning he was hypotensive, BP improved after 250 bolus.   Assessment/Plan:  LLL HCAP Awaiting blood cultures/sputum - if + for pseudomonas this will suggest incurable systemic infection - cont empiric abx for now - presently the pt appears to be improving clinically. Chest x ray with lingular infiltrates.  Continue with vancomycin and Meropenem day 3.   Blood culture no growth to date.   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 Last admission he was seen by palliative care, family wishes to continue with care.  -Finished meropenem and rifampin December 24 for recurrent bacteriemia, Last admission to hospital was 02-28-2014 for the same/  -Need to resume Levaquin after he finish current treatment for PNA.    Hematuria in the setting of  hx of prostate CA and radiation therapy A recurring issue - previously required continuous bladder irrigation per Urology - recently restarted ASA + Plavix - stop asa and plavix again and follow   C diff 12/16 PCR negative at this time but prior plan was to continue "indefinite" oral vanc until recheck by ID at [facility] (Dr. 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  Resume metoprolol.   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   Code Status: NO CODE - DNR Family Communication: no family present at time of exam Disposition Plan: - cont empiric abx - f/u culture data - PT/OT   Consultants: PCCM  Procedures: none  Antibiotics: Meropenem 1/9 > IV vanc 1/9 > Oral vanc 1/9 >  DVT prophylaxis: SCDs + prior IVC filter  Objective: Blood pressure 113/74, pulse 108, temperature 97.7 F (36.5 C), temperature source Oral, resp. rate 20, height 6\' 2"  (1.88 m), weight 100.4 kg (221 lb 5.5 oz), SpO2 97 %.  Intake/Output Summary (Last  24 hours) at 03/26/14 1448 Last data filed at 03/26/14 0532  Gross per 24 hour  Intake    100 ml  Output   2600 ml  Net  -2500 ml   Exam: General: No acute respiratory distress at rest in bed  Lungs: bibasilar crackles L > R - no wheeze  Cardiovascular: Regular rate and rhythm without murmur gallop or rub  Abdomen: Nontender, nondistended, soft, bowel sounds positive, no ascites,  no appreciable mass - PEG insertion site ~midline and clean/dry Extremities: No significant cyanosis, clubbing;  1+ chronic appearing edema L LE   Data Reviewed: Basic Metabolic Panel:  Recent Labs Lab 03/23/14 1455 03/24/14 0319 03/25/14 0210 03/26/14 0550  NA 136 140 138 134*  K 3.6 3.6 3.4* 3.9  CL 107 111 110 109  CO2 24 25 24 20   GLUCOSE 112* 101* 112* 113*  BUN 21 20 16 16   CREATININE 1.34 1.17 0.94 0.81  CALCIUM 8.6 8.0* 8.2* 8.1*  MG  --   --   --  2.0   Liver Function Tests:  Recent Labs Lab 03/23/14 1455 03/24/14 0319 03/25/14 0210 03/26/14 0550  AST 24 23 17 19   ALT 9 8 8 8   ALKPHOS 51 45 51 49  BILITOT 0.6 0.4 0.3 0.4  PROT 5.8* 4.9* 4.9* 5.1*  ALBUMIN 2.3* 1.9* 1.9* 1.9*    Coags:  Recent Labs Lab 03/23/14 1455  INR 1.23   CBC:  Recent Labs Lab 03/23/14 1455 03/24/14 0319 03/25/14 0210 03/26/14 0550  WBC 8.2 6.5 5.3 5.0  NEUTROABS 6.8  --   --   --   HGB 9.2* 7.3* 7.9* 8.5*  HCT 28.2* 22.8* 24.6* 26.4*  MCV 82.5 86.7 84.2 82.8  PLT 113* 99* 98* 86*    Recent Results (from the past 240 hour(s))  Blood Culture (routine x 2)     Status: None (Preliminary result)   Collection Time: 03/23/14  2:41 PM  Result Value Ref Range Status   Specimen Description BLOOD RIGHT ARM  Final   Special Requests BOTTLES DRAWN AEROBIC AND ANAEROBIC 5CC  Final   Culture   Final           BLOOD CULTURE RECEIVED NO GROWTH TO DATE CULTURE WILL BE HELD FOR 5 DAYS BEFORE ISSUING A FINAL NEGATIVE REPORT Performed at [facility]    Report Status PENDING  Incomplete  Blood Culture (routine x 2)     Status: None (Preliminary result)   Collection Time: 03/23/14  2:59 PM  Result Value Ref Range Status   Specimen Description BLOOD LEFT ARM  Final   Special Requests BOTTLES DRAWN AEROBIC AND ANAEROBIC 5CC  Final   Culture   Final           BLOOD CULTURE RECEIVED NO GROWTH TO DATE CULTURE WILL BE HELD FOR 5 DAYS BEFORE ISSUING A FINAL NEGATIVE  REPORT Performed at [facility]    Report Status PENDING  Incomplete  Urine culture     Status: None   Collection Time: 03/23/14  5:47 PM  Result Value Ref Range Status   Specimen Description URINE, CLEAN CATCH  Final   Special Requests NONE  Final   Colony Count NO GROWTH Performed at [facility]   Final   Culture NO GROWTH Performed at [facility]   Final   Report Status 03/25/2014 FINAL  Final  MRSA PCR Screening     Status: None   Collection Time: 03/23/14  7:16  PM  Result Value Ref Range Status   MRSA by PCR NEGATIVE NEGATIVE Final    Comment:        The GeneXpert MRSA Assay (FDA approved for NASAL specimens only), is one component of a comprehensive MRSA colonization surveillance program. It is not intended to diagnose MRSA infection nor to guide or monitor treatment for MRSA infections.   Clostridium Difficile by PCR     Status: None   Collection Time: 03/23/14  9:06 PM  Result Value Ref Range Status   C difficile by pcr NEGATIVE NEGATIVE Final     Studies:  Recent x-ray studies have been reviewed in detail by the Attending Physician  Scheduled Meds:  Scheduled Meds: . doxazosin  4 mg Oral QHS  . famotidine  40 mg Per Tube Daily  . feeding supplement (PRO-STAT SUGAR FREE 64)  30 mL Oral TID WC  . free water  100 mL Per Tube 3 times per day  . heparin  5,000 Units Subcutaneous 3 times per day  . meropenem (MERREM) IV  1 g Intravenous Q12H  . methylphenidate  5 mg Per Tube Daily  . metoprolol succinate  25 mg Oral Daily  . mirtazapine  30 mg Per Tube QHS  . potassium chloride  20 mEq Per Tube BID  . vancomycin  500 mg Per Tube 4 times per day  . vancomycin  750 mg Intravenous Q12H    Time spent on care of this patient: 35 mins   Elmarie Shiley , MD  [phone] [facility] Office  [phone] Pager - Text Page per Amion as per below:  On-Call/Text Page:      [email or web address]      password [email or web address]  If 7PM-7AM, please  contact night-coverage [email or web address] Password [email or web address] 03/26/2014, 9:03 AM   LOS: 3 days

## 2014-03-26 NOTE — Progress Notes (Signed)
Occupational Therapy Evaluation Patient Details Name: [name] MRN: [id number] DOB: 07-13-1929 Today's Date: 03/26/2014    History of Present Illness Pt is an 81 year old male w/ failure to thrive. Resides at SNF where he is being treated for chronic AAA pseudomonal graft infection and Cdiff. Followed at [facility]. Changed from carbapenem to oral Levaquin 12/10. Admitted on 1/9 to SDU w/ 2 d h/o cough, and fever. Admitted w/ working dx of new LLL HCAP +/- recurrent Pseudomonas infection w/ resultant sepsis.    Clinical Impression   Limited evaluation due to patient not wanting to participate. Patient requires assistance with all ADLs at this time. Recommend return to SNF at discharge.    Follow Up Recommendations  SNF    Equipment Recommendations  None recommended by OT    Recommendations for Other Services PT consult     Precautions / Restrictions Precautions Precautions: Fall Precaution Comments: Rectal tube; Foley Restrictions Weight Bearing Restrictions: No      Mobility Bed Mobility               General bed mobility comments:  (pt refused)  Transfers                 General transfer comment: pt refused    Balance                                            ADL Overall ADL's : Needs assistance/impaired Eating/Feeding: NPO   Grooming: Wash/dry face;Wash/dry hands;Set up   Upper Body Bathing: Total assistance   Lower Body Bathing: Total assistance   Upper Body Dressing : Total assistance   Lower Body Dressing: Total assistance     Toilet Transfer Details (indicate cue type and reason): has foley and rectal tube         Functional mobility during ADLs:  (refused) General ADL Comments: Patient received in bed. Refused EOB/OOB activities. Raised HOB up and patient performed some bed level activities with max encouragement. States he prefers to be bathed and dressed by staff. States nursing home staff put him in the shower  occasionally. States he has bowel movements and urinates in the bed at the nursing home and they clean him up -- states he never goes to the toilet. States his hearing aids were lost (accidentally thrown away) at nursing home. Very difficult to communicate with due to [facility]. Patient is self-limiting.     Vision                     Perception     Praxis      Pertinent Vitals/Pain Pain Assessment: No/denies pain     Hand Dominance Right   Extremity/Trunk Assessment Upper Extremity Assessment Upper Extremity Assessment: Generalized weakness   Lower Extremity Assessment Lower Extremity Assessment: Defer to PT evaluation       Communication Communication Communication: HOH   Cognition Arousal/Alertness: Awake/alert Behavior During Therapy: WFL for tasks assessed/performed Overall Cognitive Status: No family/caregiver present to determine baseline cognitive functioning                     General Comments       Exercises       Shoulder Instructions      Home Living Family/patient expects to be discharged to:: Skilled nursing facility  Prior Functioning/Environment Level of Independence: Needs assistance  Gait / Transfers Assistance Needed: Pt had been at SNF and reports at times can ambulate ~15 feet. At other times is at a transfers only level.  ADL's / Homemaking Assistance Needed: Assist required for ADL's        OT Diagnosis: Generalized weakness   OT Problem List: Decreased strength;Decreased activity tolerance;Decreased safety awareness;Decreased knowledge of use of DME or AE   OT Treatment/Interventions: Self-care/ADL training;Therapeutic exercise;DME and/or AE instruction;Therapeutic activities;Patient/family education    OT Goals(Current goals can be found in the care plan section) Acute Rehab OT Goals Patient Stated Goal: None stated OT Goal Formulation: With patient Time For Goal  Achievement: 04/09/14 Potential to Achieve Goals: Fair  OT Frequency: Min 2X/week   Barriers to D/C:            Co-evaluation              End of Session    Activity Tolerance: Patient tolerated treatment well;Other (comment) (limited evaluation due to pt not willing to participate) Patient left: in bed;with call bell/phone within reach   Time: 0935-0950 OT Time Calculation (min): 15 min Charges:  OT General Charges $OT Visit: 1 Procedure OT Evaluation $Initial OT Evaluation Tier I: 1 Procedure OT Treatments $Self Care/Home Management : 8-22 mins G-Codes:    Obaloluwa Delatte A 03/28/2014, 11:04 AM

## 2014-03-26 NOTE — Progress Notes (Signed)
BP this AM is 84/59, HR 112.  Patient stated that he felt fine.  In no acute distress.  Tylene Fantasia NP notified; 250 cc bolus of normal saline ordered and administered.  Will recheck BP.

## 2014-03-27 DIAGNOSIS — N183 Chronic kidney disease, stage 3 (moderate): Secondary | ICD-10-CM

## 2014-03-27 DIAGNOSIS — A047 Enterocolitis due to Clostridium difficile: Secondary | ICD-10-CM

## 2014-03-27 LAB — VANCOMYCIN, TROUGH: Vancomycin Tr: 23.2 ug/mL — ABNORMAL HIGH (ref 10.0–20.0)

## 2014-03-27 MED ORDER — LEVOFLOXACIN 750 MG PO TABS
750.0000 mg | ORAL_TABLET | Freq: Every day | ORAL | Status: DC
  Administered 2014-03-27: 750 mg via ORAL
  Filled 2014-03-27 (×2): qty 1

## 2014-03-27 MED ORDER — PRO-STAT SUGAR FREE PO LIQD: 30.0000 mL | Freq: Three times a day (TID) | ORAL | Status: DC

## 2014-03-27 NOTE — Progress Notes (Signed)
Occupational Therapy Treatment Patient Details Name: [name] MRN: [id number] DOB: Jun 27, 1929 Today's Date: 03/27/2014    History of present illness Pt is an 81 year old male w/ failure to thrive. Resides at SNF where he is being treated for chronic AAA pseudomonal graft infection and Cdiff. Followed at [facility]. Changed from carbapenem to oral Levaquin 12/10. Admitted on 1/9 to SDU w/ 2 d h/o cough, and fever. Admitted w/ working dx of new LLL HCAP +/- recurrent Pseudomonas infection w/ resultant sepsis.    OT comments  Pt was more agreeable to activity today.  Pt continues to have issues with his bowels and leaking around tube which disrupts therapy.  Pt bathed at bed level participating in a full adl despite bowel issues.  Follow Up Recommendations  SNF    Equipment Recommendations  None recommended by OT    Recommendations for Other Services      Precautions / Restrictions Precautions Precautions: Fall Precaution Comments: Rectal tube; Foley Restrictions Weight Bearing Restrictions: No       Mobility Bed Mobility Overal bed mobility: Needs Assistance Bed Mobility: Supine to Sit;Rolling Rolling: Min assist (to get far enough over to properly clean.)   Supine to sit: Min assist (1/2 way completed before needing to clean after foley leaked)     General bed mobility comments: Pt moving well in bed and should be able to do more on EOB if willing.  Transfers   Equipment used: None             General transfer comment: pt refused    Balance                                   ADL Overall ADL's : Needs assistance/impaired Eating/Feeding: NPO   Grooming: Wash/dry face;Wash/dry hands;Set up Grooming Details (indicate cue type and reason): set up in bed Upper Body Bathing: Bed level;Minimal assitance Upper Body Bathing Details (indicate cue type and reason): pt agreed to bathe upper body in bed. Lower Body Bathing: Maximal assistance;Bed level Lower  Body Bathing Details (indicate cue type and reason): Pt washed down to his feet by pulling his feet up in the bed.  Pt stated his R foot was "dead" but was able to move it well while bathing.                     Functional mobility during ADLs: Moderate assistance General ADL Comments: Pt rolled side to side in bed with minimal assist to get cleaned up after rectal foley leaked.  Pt was cooperative with bathing self at bed level and was able to do more than he thought he could do.  Pt was 1/2 way to sitting on EOB when noticed rectal foley was leaking. Pt able to do first 1/2 of transition with min a.      Vision                     Perception     Praxis      Cognition   Behavior During Therapy: [facility] for tasks assessed/performed Overall Cognitive Status: No family/caregiver present to determine baseline cognitive functioning                       Extremity/Trunk Assessment               Exercises     Shoulder  Instructions       General Comments      Pertinent Vitals/ Pain       Pain Assessment: No/denies pain  Home Living                                          Prior Functioning/Environment              Frequency Min 2X/week     Progress Toward Goals  OT Goals(current goals can now be found in the care plan section)  Progress towards OT goals: Progressing toward goals  Acute Rehab OT Goals Patient Stated Goal: None stated OT Goal Formulation: With patient Time For Goal Achievement: 04/09/14 Potential to Achieve Goals: Fair ADL Goals Pt Will Perform Grooming: with set-up Pt Will Perform Upper Body Bathing: with min assist Pt Will Perform Upper Body Dressing: with min assist Pt Will Transfer to Toilet: with mod assist  Plan Discharge plan remains appropriate    Co-evaluation                 End of Session     Activity Tolerance Patient tolerated treatment well   Patient Left in bed;with call  bell/phone within reach   Nurse Communication Mobility status;Other (comment) (leaking rectal tube.)        Time: 6203-5597 OT Time Calculation (min): 30 min  Charges: OT General Charges $OT Visit: 1 Procedure OT Treatments $Self Care/Home Management : 23-37 mins  Glenford Peers 03/27/2014, 11:46 AM  [phone]

## 2014-03-27 NOTE — Clinical Social Work Note (Addendum)
Patient medically stable for discharge back to [place] today. CSW forwarded discharge information to facility and talked with admissions director [place] regarding discharge.  Contact made with patient's wife, [name] and informed her of discharge. CSW contacted by patient's daughter, [name] [phone]) regarding discharge. CSW waiting to hear from [place] regarding authorization from [facility].  Authorization received from [employer] and daughter contacted at 4:43 pm to advise that patient's nurse will be calling her once transport arrives.   Doralene Glanz Givens, MSW, LCSW Licensed Clinical Social Worker [place] [phone]

## 2014-03-27 NOTE — Care Management Note (Signed)
CARE MANAGEMENT NOTE 03/27/2014  Patient:  BRONDON, WANN   Account Number:  [id number]  Date Initiated:  03/25/2014  Documentation initiated by:  Elissa Hefty  Subjective/Objective Assessment:   adm w septic shock     Action/Plan:   has wife, was at nsg facility  03/27/2014 Plan to return to SNF today. IM given   Anticipated DC Date:  03/27/2014   Anticipated DC Plan:  SKILLED NURSING FACILITY  In-house referral  Clinical Social Worker         Choice offered to / List presented to:             Status of service:  Completed, signed off Medicare Important Message given?  YES (If response is "NO", the following Medicare IM given date fields will be blank) Date Medicare IM given:  03/27/2014 Medicare IM given by:  Taler Kushner Date Additional Medicare IM given:   Additional Medicare IM given by:    Discharge Disposition:  [place]  Per UR Regulation:  Reviewed for med. necessity/level of care/duration of stay  If discussed at [place] of Stay Meetings, dates discussed:    Comments:  03/27/2014 Plan to d/c back to SNF today, CSW following. CRoyal RN MPH, case manager, [phone]   1/11 [phone] debbie dowell rn,bsn had hhc arranged w [facility] but went to snf for rehab last adm.

## 2014-03-27 NOTE — Discharge Summary (Signed)
Physician Discharge Summary  [name] VVO:[id number] DOB: 03/07/1930 DOA: 03/23/2014  PCP: [name], MD  Admit date: 03/23/2014 Discharge date: 03/27/2014  Time spent: 45 minutes  Recommendations for Outpatient Follow-up:  Patient will be discharged to [facility] facility. Patient will need to follow-up with his primary care physician as well as his physician at [facility]. Patient should continue medications as prescribed. Patient continue his Jevity tube feedings.    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 Difficile Chronic dysphagia and failure to thrive/protein, malnutrition, severe Acute on chronic disease plus chronic blood loss Chronic combined systolic and diastolic heart failure CKD stage III Thrombocytopenia History of DVT lower extremity, post IVC filter Hypokalemia  Discharge Condition: Stable  Diet recommendation: Continue Jevity Tube feeds  Filed Weights   03/25/14 0300 03/26/14 1100 03/26/14 2113  Weight: 100.4 kg (221 lb 5.5 oz) 100.6 kg (221 lb 12.5 oz) 101.35 kg (223 lb 7 oz)    History of present illness:  On 03/23/2014 by Dr. Baltazar Apo 81yo male with 10 hospitalizations since endovascular repair of thoracoabdominal aortic aneurysm in 09/2013, initially for repairs of endovascular leak and Pseudomonal sepsis 2/2 endovascular graft infection, but more recently for failure to thrive s/p PEG tube, sent from [facility] for ID followup. From an ID standpoint, he has a chronic Pseudomonal endovascular graft infection that has been deemed inoperable, is s/p 6 weeks of IV antibiotics and has now been transitioned to oral levofloxacin for long-term suppression, per his ID MD at [facility]. Per history presented to ED w/ 2 d h/o cough, then developed fever >103 on 1/9. EMS called, found him hypotensive and  tachycardic. Brought to ER for further eval.   Hospital Course:  LLL HCAP -Blood cultures negative to date -Chest x ray with lingular infiltrates.  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  -Last admission he was seen by palliative care, family wishes to continue with care.  -Finished meropenem and rifampin December 24 for recurrent bacteriemia, Last admission to hospital was 02-28-2014 for the same/ -Will continue levaquin   Hematuria in the setting of hx of prostate CA and radiation therapy -A recurring issue   -previously required continuous bladder irrigation per Urology - recently restarted ASA + Plavix which were discontinued  -Will need to followup with PCP regarding restarting aspirin and plavix -Hb has remained stable  C diff 12/16 -PCR negative at this time but prior plan was to continue "indefinite" oral vanc until recheck by ID at [facility] (Dr. Vista Deck)  -continue oral vanc   Chronic dysphagia and FTT - Protein-calorie malnutrition, severe -40+ lb wt loss in last 4 mo - tube feedings - able to tolerate oral intake for pleasure    Anemia of chronic disease + chronic blood loss  -Hb currently stable  Chronic combined systolic (EF 26%) and grade 1 diastolic congestive heart failure -Currently compensated -Resume metoprolol.   CKD Stage III -Baseline renal function BUN 26 creatinine 1.41 - crt currently better that his baseline   Thrombocytopenia -Plt count stable at this time   History  of DVT of lower extremity (Feb 2015) - post IVC filter  Hypokalemia -Resolved, mg at 2.0.   Code status:  DNR  Procedures: None  Consultations: PCCM  Discharge Exam: Filed Vitals:   03/27/14 1037  BP: 136/82  Pulse: 90  Temp:   Resp:      General: Well developed, NAD, appears stated age  81: NCAT, mucous membranes moist.  Cardiovascular: S1 S2 auscultated, no rubs, murmurs or gallops. Regular rate and rhythm.  Respiratory: Bibasilar crackles, L>R, no audible wheezing  Abdomen: Soft, nontender, nondistended, + bowel sounds, Peg tube  Extremities: warm dry without cyanosis clubbing. +1edema LE B/L  Discharge Instructions      Discharge Instructions    Discharge instructions    Complete by:  As directed   Patient will be discharged to [place]. Patient will need to follow-up with his primary care physician as well as his physician at [facility]. Patient should continue medications as prescribed. Patient continue his Jevity tube feedings.            Medication List    TAKE these medications        acetaminophen 500 MG tablet  Commonly known as:  TYLENOL  Take 1,000 mg by mouth every 6 (six) hours as needed for mild pain or moderate pain.     albuterol (2.5 MG/3ML) 0.083% nebulizer solution  Commonly known as:  PROVENTIL  Take 3 mLs (2.5 mg total) by nebulization every 2 (two) hours as needed for wheezing or shortness of breath.     docusate sodium 100 MG capsule  Commonly known as:  COLACE  Take 100 mg by mouth daily as needed for mild constipation.     doxazosin 4 MG tablet  Commonly known as:  CARDURA  Take 4 mg by mouth at bedtime.     famotidine 40 MG/5ML suspension  Commonly known as:  PEPCID  Place 5 mLs (40 mg total) into feeding tube daily.     feeding supplement (JEVITY 1.2 CAL) Liqd  Place 1,000 mLs into feeding tube continuous.     feeding supplement (PRO-STAT SUGAR FREE 64) Liqd  Take 30 mLs by mouth 3 (three) times daily with meals.     free water Soln  Place 100 mLs into feeding tube every 8 (eight) hours.     levofloxacin  750 MG tablet  Commonly known as:  LEVAQUIN  Please resume oral levaquin when IV Meropenem is complete on 12/25.  Continue indefintely for suppression of infection.  This medication will be managed by Infectious Disease at [facility].     LORazepam 2 MG/ML concentrated solution  Commonly known as:  ATIVAN  Take 0.1 mLs (0.2 mg total) by mouth every 6 (six) hours as needed for anxiety.     magic mouthwash Soln  Take 5 mLs by mouth 4 (four) times daily. 10 day regimen started 03/22/14     methylphenidate 5 MG tablet  Commonly known as:  RITALIN  Take 1 tablet (5 mg total) by mouth daily.     metoprolol succinate 25 MG 24 hr tablet  Commonly known as:  TOPROL-XL  Take 25 mg by mouth daily.     mirtazapine 30 MG tablet  Commonly known as:  REMERON  Take 30 mg by mouth at bedtime.     rifampin 300 MG capsule  Commonly known as:  RIFADIN  Take 300 mg by mouth every 12 (twelve) hours.     simvastatin 20 MG tablet  Commonly known as:  ZOCOR  Take 20 mg by mouth daily.     traMADol 50 MG tablet  Commonly known as:  ULTRAM  Take 1 tablet (50 mg total) by mouth every 6 (six) hours as needed for moderate pain.     triamcinolone cream 0.1 %  Commonly known as:  KENALOG  Apply 1 application topically 2 (two) times daily as needed (rash).     vancomycin 50 mg/mL oral solution  Commonly known as:  VANCOCIN  For [employer].  Continue indefinitely until patient is seen in follow up at [facility] Infectious Disease. (as the patient will be on continuous Levaquin).       Allergies  Allergen Reactions  . Atorvastatin Other (See Comments)    Causes constipation  . Penicillins Other (See Comments)    Other reaction(s): RASH HIVES    Follow-up Information    Follow up with [name], MD. Schedule an appointment as soon as possible for a visit in 1 week.   Specialty:  Internal Medicine   Why:  Hospital followup   Contact information:   [address] [place]  [address] [phone]       Follow up with [facility] Infectious disease physician. Schedule an appointment as soon as possible for a visit in 1 week.       The results of significant diagnostics from this hospitalization (including imaging, microbiology, ancillary and laboratory) are listed below for reference.    Significant Diagnostic Studies: Ct Angio Abdomen W/cm &/or Wo Contrast  02/26/2014   CLINICAL DATA:  Thoracoabdominal aortic aneurysm post stenting, celiac axis pseudoaneurysm, graft became infected, septic shock due to recurrent graft infection (Pseudomonas  EXAM: CT ANGIOGRAPHY CHEST, ABDOMEN AND PELVIS  TECHNIQUE: Multidetector CT imaging through the chest, abdomen and pelvis was performed using the standard protocol during bolus administration of intravenous contrast. Multiplanar reconstructed images and MIPs were obtained and reviewed to evaluate the vascular anatomy.  CONTRAST:  132mL OMNIPAQUE IOHEXOL 350 MG/ML SOLN  COMPARISON:  CT abdomen and pelvis 01/11/2014  FINDINGS: CTA CHEST FINDINGS  Scattered atherosclerotic calcifications aorta, visualized great vessels and coronary arteries.  Aortic endoluminal stent from proximal descending thoracic aorta through aortic bifurcation.  Following contrast, normal enhancement of the aorta is identified.  No evidence of thoracic aneurysm or dissection.  Pulmonary arteries patent.  No thoracic adenopathy.  Scattered atelectasis in both lower lobes with infiltrate seen in the LEFT upper lobe.  Remaining RIGHT lung clear.  No pleural effusion or pneumothorax.  Review of the MIP images confirms the above findings.  CTA ABDOMEN AND PELVIS FINDINGS  Aortic stent extends from proximal descending thoracic aorta through abdominal aorta into BILATERAL common iliac arteries and into the RIGHT internal and external iliac arteries.  Aneurysmal dilatation of the RIGHT common iliac artery again identified 4.3 cm transverse.  Small distal abdominal aortic aneurysm  again identified, 4.4 x 3.9 cm.  Proximal abdominal aortic aneurysm at aortic hiatus, 6.3 x 6.7 cm unchanged.  Stents identified at the renal arteries bilaterally, SMA and celiac artery.  Following contrast, no evidence of endoleak identified.  Interval decrease in size of pseudoaneurysm at the celiac axis with pseudoaneurysm no longer opacified by contrast.  Mild hazy infiltration of the fat surrounding the celiac access and pseudoaneurysm is unchanged.  Small amount of thrombus identified within proximal RIGHT common iliac artery stent, stable.  No infiltration of fat surrounding the aortic or common iliac artery aneurysms identified to suggest leak/rupture.  No abnormal fluid collections are seen to  suggest abscess though infection of graft cannot be excluded by CT.  Large cyst inferior pole LEFT kidney 4.9 x 4.1 cm image 135.  Small amount of fluid adjacent to gallbladder, nonspecific cannot exclude cholecystitis with this appearance.  IVC filter noted.  PEG tube in stomach.  Descending and sigmoid colonic diverticulosis without evidence of diverticulitis.  Foley catheter and small amount of air within urinary bladder with question minimal bladder wall thickening anteriorly and intraluminal high attenuation material likely blood or debris.  Minimal prostate enlargement.  Remaining bowel loops unremarkable.  No additional mass, adenopathy, free fluid or free air.  Bones demineralized.  Review of the MIP images confirms the above findings.  IMPRESSION: Stable sizes of thoracoabdominal, distal aortic and RIGHT common iliac artery aneurysms post endoluminal stenting.  No evidence of endoleak or adjacent abscess collection.  Interval additional stenting of the celiac artery with previously identified pseudoaneurysm no longer opacified and smaller in size.  Dissection seen at the common hepatic artery origin on the previous study is not definitely visualized on the current study which demonstrates an interval stent.   Suspected blood or debris within urinary bladder, cannot exclude minimal anterior bladder wall thickening.  Colonic diverticulosis.  Patchy bibasilar atelectasis with mild LEFT upper lobe infiltrate suspicious for pneumonia.  Small amount of fluid adjacent to the gallbladder, cannot exclude cholecystitis; recommend clinical correlation and if indicated ultrasound imaging.   Electronically Signed   By: Lavonia Dana M.D.   On: 02/26/2014 13:38   Dg Chest Port 1 View  03/24/2014   CLINICAL DATA:  Difficulty breathing  EXAM: PORTABLE CHEST - 1 VIEW  COMPARISON:  March 23, 2014  FINDINGS: Mild patchy opacity in the lingula persists. There is atelectatic change in the right base, slightly increased from 1 day prior. Lungs elsewhere clear. Heart is upper normal in size with pulmonary vascular within normal limits. Aortic stent graft present. No adenopathy.  IMPRESSION: Slight increase in right base atelectasis. Patchy infiltrate in lingular region, stable. Lungs elsewhere clear. No change in cardiac silhouette.   Electronically Signed   By: Lowella Grip M.D.   On: 03/24/2014 07:31   Dg Chest Port 1 View  03/23/2014   CLINICAL DATA:  Initial evaluation for fever of 103 degrees in decreased lungs sounds  EXAM: PORTABLE CHEST - 1 VIEW  COMPARISON:  02/21/2014  FINDINGS: Cardiac enlargement stable. Descending thoracic aortic stent graft stable. Vascular pattern otherwise normal.  Mild patchy infiltrate in the lingula.  Right lung appears clear.  IMPRESSION: Lingular infiltrate concerning for pneumonia.   Electronically Signed   By: Skipper Cliche M.D.   On: 03/23/2014 15:28   Ct Angio Chest Aortic Dissect W &/or W/o  02/26/2014   CLINICAL DATA:  Thoracoabdominal aortic aneurysm post stenting, celiac axis pseudoaneurysm, graft became infected, septic shock due to recurrent graft infection (Pseudomonas  EXAM: CT ANGIOGRAPHY CHEST, ABDOMEN AND PELVIS  TECHNIQUE: Multidetector CT imaging through the chest, abdomen  and pelvis was performed using the standard protocol during bolus administration of intravenous contrast. Multiplanar reconstructed images and MIPs were obtained and reviewed to evaluate the vascular anatomy.  CONTRAST:  143mL OMNIPAQUE IOHEXOL 350 MG/ML SOLN  COMPARISON:  CT abdomen and pelvis 01/11/2014  FINDINGS: CTA CHEST FINDINGS  Scattered atherosclerotic calcifications aorta, visualized great vessels and coronary arteries.  Aortic endoluminal stent from proximal descending thoracic aorta through aortic bifurcation.  Following contrast, normal enhancement of the aorta is identified.  No evidence of thoracic aneurysm or dissection.  Pulmonary  arteries patent.  No thoracic adenopathy.  Scattered atelectasis in both lower lobes with infiltrate seen in the LEFT upper lobe.  Remaining RIGHT lung clear.  No pleural effusion or pneumothorax.  Review of the MIP images confirms the above findings.  CTA ABDOMEN AND PELVIS FINDINGS  Aortic stent extends from proximal descending thoracic aorta through abdominal aorta into BILATERAL common iliac arteries and into the RIGHT internal and external iliac arteries.  Aneurysmal dilatation of the RIGHT common iliac artery again identified 4.3 cm transverse.  Small distal abdominal aortic aneurysm again identified, 4.4 x 3.9 cm.  Proximal abdominal aortic aneurysm at aortic hiatus, 6.3 x 6.7 cm unchanged.  Stents identified at the renal arteries bilaterally, SMA and celiac artery.  Following contrast, no evidence of endoleak identified.  Interval decrease in size of pseudoaneurysm at the celiac axis with pseudoaneurysm no longer opacified by contrast.  Mild hazy infiltration of the fat surrounding the celiac access and pseudoaneurysm is unchanged.  Small amount of thrombus identified within proximal RIGHT common iliac artery stent, stable.  No infiltration of fat surrounding the aortic or common iliac artery aneurysms identified to suggest leak/rupture.  No abnormal fluid  collections are seen to suggest abscess though infection of graft cannot be excluded by CT.  Large cyst inferior pole LEFT kidney 4.9 x 4.1 cm image 135.  Small amount of fluid adjacent to gallbladder, nonspecific cannot exclude cholecystitis with this appearance.  IVC filter noted.  PEG tube in stomach.  Descending and sigmoid colonic diverticulosis without evidence of diverticulitis.  Foley catheter and small amount of air within urinary bladder with question minimal bladder wall thickening anteriorly and intraluminal high attenuation material likely blood or debris.  Minimal prostate enlargement.  Remaining bowel loops unremarkable.  No additional mass, adenopathy, free fluid or free air.  Bones demineralized.  Review of the MIP images confirms the above findings.  IMPRESSION: Stable sizes of thoracoabdominal, distal aortic and RIGHT common iliac artery aneurysms post endoluminal stenting.  No evidence of endoleak or adjacent abscess collection.  Interval additional stenting of the celiac artery with previously identified pseudoaneurysm no longer opacified and smaller in size.  Dissection seen at the common hepatic artery origin on the previous study is not definitely visualized on the current study which demonstrates an interval stent.  Suspected blood or debris within urinary bladder, cannot exclude minimal anterior bladder wall thickening.  Colonic diverticulosis.  Patchy bibasilar atelectasis with mild LEFT upper lobe infiltrate suspicious for pneumonia.  Small amount of fluid adjacent to the gallbladder, cannot exclude cholecystitis; recommend clinical correlation and if indicated ultrasound imaging.   Electronically Signed   By: Lavonia Dana M.D.   On: 02/26/2014 13:38   US Abdomen Limited Ruq  02/27/2014   CLINICAL DATA:  Pericholecystic fluid on CT, evaluate gallbladder  EXAM: US ABDOMEN LIMITED - RIGHT UPPER QUADRANT  COMPARISON:  CTA abdomen pelvis dated 02/26/2014  FINDINGS: Gallbladder:  No  gallstones, gallbladder wall thickening, or pericholecystic fluid. Negative sonographic Murphy's sign.  Common bile duct:  Diameter: 4 mm.  Liver:  No focal lesion identified. At the upper limits of normal for parenchymal echogenicity.  IMPRESSION: Negative right upper quadrant ultrasound.   Electronically Signed   By: Julian Hy M.D.   On: 02/27/2014 22:06    Microbiology: Recent Results (from the past 240 hour(s))  Blood Culture (routine x 2)     Status: None (Preliminary result)   Collection Time: 03/23/14  2:41 PM  Result Value Ref Range Status  Specimen Description BLOOD RIGHT ARM  Final   Special Requests BOTTLES DRAWN AEROBIC AND ANAEROBIC 5CC  Final   Culture   Final           BLOOD CULTURE RECEIVED NO GROWTH TO DATE CULTURE WILL BE HELD FOR 5 DAYS BEFORE ISSUING A FINAL NEGATIVE REPORT Performed at [facility]    Report Status PENDING  Incomplete  Blood Culture (routine x 2)     Status: None (Preliminary result)   Collection Time: 03/23/14  2:59 PM  Result Value Ref Range Status   Specimen Description BLOOD LEFT ARM  Final   Special Requests BOTTLES DRAWN AEROBIC AND ANAEROBIC 5CC  Final   Culture   Final           BLOOD CULTURE RECEIVED NO GROWTH TO DATE CULTURE WILL BE HELD FOR 5 DAYS BEFORE ISSUING A FINAL NEGATIVE REPORT Performed at [facility]    Report Status PENDING  Incomplete  Urine culture     Status: None   Collection Time: 03/23/14  5:47 PM  Result Value Ref Range Status   Specimen Description URINE, CLEAN CATCH  Final   Special Requests NONE  Final   Colony Count NO GROWTH Performed at [facility]   Final   Culture NO GROWTH Performed at [facility]   Final   Report Status 03/25/2014 FINAL  Final  MRSA PCR Screening     Status: None   Collection Time: 03/23/14  7:16 PM  Result Value Ref Range Status   MRSA by PCR NEGATIVE NEGATIVE Final    Comment:        The GeneXpert MRSA Assay (FDA approved for NASAL  specimens only), is one component of a comprehensive MRSA colonization surveillance program. It is not intended to diagnose MRSA infection nor to guide or monitor treatment for MRSA infections.   Clostridium Difficile by PCR     Status: None   Collection Time: 03/23/14  9:06 PM  Result Value Ref Range Status   C difficile by pcr NEGATIVE NEGATIVE Final     Labs: Basic Metabolic Panel:  Recent Labs Lab 03/23/14 1455 03/24/14 0319 03/25/14 0210 03/26/14 0550  NA 136 140 138 134*  K 3.6 3.6 3.4* 3.9  CL 107 111 110 109  CO2 24 25 24 20   GLUCOSE 112* 101* 112* 113*  BUN 21 20 16 16   CREATININE 1.34 1.17 0.94 0.81  CALCIUM 8.6 8.0* 8.2* 8.1*  MG  --   --   --  2.0   Liver Function Tests:  Recent Labs Lab 03/23/14 1455 03/24/14 0319 03/25/14 0210 03/26/14 0550  AST 24 23 17 19   ALT 9 8 8 8   ALKPHOS 51 45 51 49  BILITOT 0.6 0.4 0.3 0.4  PROT 5.8* 4.9* 4.9* 5.1*  ALBUMIN 2.3* 1.9* 1.9* 1.9*   No results for input(s): LIPASE, AMYLASE in the last 168 hours. No results for input(s): AMMONIA in the last 168 hours. CBC:  Recent Labs Lab 03/23/14 1455 03/24/14 0319 03/25/14 0210 03/26/14 0550  WBC 8.2 6.5 5.3 5.0  NEUTROABS 6.8  --   --   --   HGB 9.2* 7.3* 7.9* 8.5*  HCT 28.2* 22.8* 24.6* 26.4*  MCV 82.5 86.7 84.2 82.8  PLT 113* 99* 98* 86*   Cardiac Enzymes: No results for input(s): CKTOTAL, CKMB, CKMBINDEX, TROPONINI in the last 168 hours. BNP: BNP (last 3 results) No results for input(s): PROBNP in the last 8760 hours. CBG:  No results for input(s): GLUCAP in the last 168 hours.     SignedCristal Ford  [facility] 03/27/2014, 11:51 AM

## 2014-03-27 NOTE — Progress Notes (Signed)
Patient Discharge: Disposition: Patient discharged to [facility]. Given report to the nurse at the [facility]. IV: Discontinued IV before discharge. Transportation: Patient transported via ambulance. Belongings: Patient took all his belongings with him.

## 2014-03-28 ENCOUNTER — Non-Acute Institutional Stay (SKILLED_NURSING_FACILITY): Payer: Medicare Other | Admitting: Internal Medicine

## 2014-03-28 ENCOUNTER — Encounter: Payer: Self-pay | Admitting: Internal Medicine

## 2014-03-28 DIAGNOSIS — E43 Unspecified severe protein-calorie malnutrition: Secondary | ICD-10-CM

## 2014-03-28 DIAGNOSIS — A047 Enterocolitis due to Clostridium difficile: Secondary | ICD-10-CM

## 2014-03-28 DIAGNOSIS — D696 Thrombocytopenia, unspecified: Secondary | ICD-10-CM

## 2014-03-28 DIAGNOSIS — Z86718 Personal history of other venous thrombosis and embolism: Secondary | ICD-10-CM

## 2014-03-28 DIAGNOSIS — A0472 Enterocolitis due to Clostridium difficile, not specified as recurrent: Secondary | ICD-10-CM

## 2014-03-28 DIAGNOSIS — T827XXD Infection and inflammatory reaction due to other cardiac and vascular devices, implants and grafts, subsequent encounter: Secondary | ICD-10-CM

## 2014-03-28 DIAGNOSIS — J189 Pneumonia, unspecified organism: Secondary | ICD-10-CM

## 2014-03-28 DIAGNOSIS — N183 Chronic kidney disease, stage 3 unspecified: Secondary | ICD-10-CM

## 2014-03-28 DIAGNOSIS — R627 Adult failure to thrive: Secondary | ICD-10-CM

## 2014-03-28 DIAGNOSIS — R1310 Dysphagia, unspecified: Secondary | ICD-10-CM

## 2014-03-28 DIAGNOSIS — Z931 Gastrostomy status: Secondary | ICD-10-CM

## 2014-03-28 DIAGNOSIS — I5043 Acute on chronic combined systolic (congestive) and diastolic (congestive) heart failure: Secondary | ICD-10-CM

## 2014-03-28 DIAGNOSIS — R319 Hematuria, unspecified: Secondary | ICD-10-CM

## 2014-03-28 DIAGNOSIS — D62 Acute posthemorrhagic anemia: Secondary | ICD-10-CM

## 2014-03-28 DIAGNOSIS — I10 Essential (primary) hypertension: Secondary | ICD-10-CM

## 2014-03-28 NOTE — Progress Notes (Signed)
MRN: [id number] Name: [name]  Sex: male Age: 81 y.o. DOB: [birth year]  [place] #: Helene Kelp Facility/Room: 312 Level Of Care: SNF Provider: Inocencio Homes D Emergency Contacts: Extended Emergency Contact Information Primary Emergency Contact: Thacker,Brenda Address: [address]          [place], [place] [address] Johnnette Litter of [place] Phone: [phone] Relation: Daughter Secondary Emergency Contact: Titus Dubin, [place] [address] Johnnette Litter of [place] Phone: [phone] Mobile Phone: [phone] Relation: Daughter     Allergies: Atorvastatin and Penicillins  Chief Complaint  Patient presents with  . New Admit To SNF    HPI: Patient is 81 y.o. male who was hosp for PNA with chronically infected aortic graft admitted to SNF for cont care and OT/PT.  Past Medical History  Diagnosis Date  . Hypertension   . BPH (benign prostatic hyperplasia)   . Hyperlipidemia   . Cancer 2008-2009    prostate TREATED WITH RADIATION  . AAA (abdominal aortic aneurysm) 04/2012    STENTING OF AAA IN [place]  . Junctional cardiac arrhythmia     Occurred postoperatively after urologic surgery  . Severe sinus bradycardia     Occurred postoperatively after urologic surgery  . Aortic valve disorders   . Other primary cardiomyopathies   . Dysphagia   . Status post insertion of percutaneous endoscopic gastrostomy (PEG) tube 02/11/14  . Bacteremia due to Pseudomonas   . Infected aortic graft     Past Surgical History  Procedure Laterality Date  . Abdominal surgery      PART OF COLON REMOVED FOR DIVERTICULITIS  . Appendectomy    . Hernia repair    . Bladder surgery  2008    FOR BLADDER STONE  . Total knee arthroplasty  1990    left  . Abdominal aortic aneurysm repair  04/2012  . Total knee arthroplasty Right 07/26/2012    Procedure: RIGHT TOTAL KNEE ARTHROPLASTY;  Surgeon: Tobi Bastos, MD;  [facility]: [facility];  Service: Orthopedics;   Laterality: Right;  . Nephrolithotomy Left 04/26/2013    Procedure: LEFT PERCUTANEOUS NEPHROLITHOTOMY ;  Surgeon: [name], MD;  Location: [facility];  Service: Urology;  Laterality: Left;  . Nephrolithotomy Left 05/03/2013    Procedure: 2ND STAGE LEFT PERCUTANEOUS NEPHROLITHOTOMY ;  Surgeon: [name], MD;  Location: [facility];  Service: Urology;  Laterality: Left;  . Esophagogastroduodenoscopy N/A 02/11/2014    IFO:YDXAJ hiatal hernia. Patchy erythema of the gastric mucosa; otherwise negative EGD. Status post 53 French Microvasive PEG tube placement  . Peg placement N/A 02/11/2014    Procedure: PERCUTANEOUS ENDOSCOPIC GASTROSTOMY (PEG) PLACEMENT;  Surgeon: Daneil Dolin, MD;  [facility];  Service: Endoscopy;  Laterality: N/A;      Medication List       This list is accurate as of: 03/28/14 11:59 PM.  Always use your most recent med list.               acetaminophen 500 MG tablet  Commonly known as:  TYLENOL  Take 1,000 mg by mouth every 6 (six) hours as needed for mild pain or moderate pain.     albuterol (2.5 MG/3ML) 0.083% nebulizer solution  Commonly known as:  PROVENTIL  Take 3 mLs (2.5 mg total) by nebulization every 2 (two) hours as needed for wheezing or shortness of breath.     docusate sodium 100 MG capsule  Commonly known as:  COLACE  Take 100 mg by mouth daily as needed for mild constipation.     doxazosin 4 MG tablet  Commonly known as:  CARDURA  Take 4 mg by mouth at bedtime.     famotidine 40 MG/5ML suspension  Commonly known as:  PEPCID  Place 5 mLs (40 mg total) into feeding tube daily.     feeding supplement (JEVITY 1.2 CAL) Liqd  Place 1,000 mLs into feeding tube continuous.     feeding supplement (PRO-STAT SUGAR FREE 64) Liqd  Take 30 mLs by mouth 3 (three) times daily with meals.     free water Soln  Place 100 mLs into feeding tube every 8 (eight) hours.     levofloxacin 750 MG tablet  Commonly known as:  LEVAQUIN  Please resume oral  levaquin when IV Meropenem is complete on 12/25.  Continue indefintely for suppression of infection.  This medication will be managed by Infectious Disease at [facility].     LORazepam 2 MG/ML concentrated solution  Commonly known as:  ATIVAN  Take 0.1 mLs (0.2 mg total) by mouth every 6 (six) hours as needed for anxiety.     magic mouthwash Soln  Take 5 mLs by mouth 4 (four) times daily. 10 day regimen started 03/22/14     methylphenidate 5 MG tablet  Commonly known as:  RITALIN  Take 1 tablet (5 mg total) by mouth daily.     metoprolol succinate 25 MG 24 hr tablet  Commonly known as:  TOPROL-XL  Take 25 mg by mouth daily.     mirtazapine 30 MG tablet  Commonly known as:  REMERON  Take 30 mg by mouth at bedtime.     rifampin 300 MG capsule  Commonly known as:  RIFADIN  Take 300 mg by mouth every 12 (twelve) hours.     simvastatin 20 MG tablet  Commonly known as:  ZOCOR  Take 20 mg by mouth daily.     traMADol 50 MG tablet  Commonly known as:  ULTRAM  Take 1 tablet (50 mg total) by mouth every 6 (six) hours as needed for moderate pain.     triamcinolone cream 0.1 %  Commonly known as:  KENALOG  Apply 1 application topically 2 (two) times daily as needed (rash).     vancomycin 50 mg/mL oral solution  Commonly known as:  VANCOCIN  For [employer].  Continue indefinitely until patient is seen in follow up at [facility] Infectious Disease. (as the patient will be on continuous Levaquin).        No orders of the defined types were placed in this encounter.     There is no immunization history on file for this patient.  History  Substance Use Topics  . Smoking status: Former Smoker -- 2.00 packs/day for 37 years    Types: Cigarettes    Start date: 02/17/1949    Quit date: 03/15/1985  . Smokeless tobacco: Never Used     Comment: quit 25 years ago  . Alcohol Use: No    Family history is noncontributory    Review of Systems  DATA OBTAINED: from patient, family member; wants to get  up and start tx GENERAL:  no fevers, fatigue, appetite changes SKIN: No itching, rash or wounds EYES: No eye pain, redness, discharge EARS: No earache, tinnitus, change in hearing NOSE: No congestion, drainage or bleeding  MOUTH/THROAT: No mouth or tooth pain, No sore throat RESPIRATORY: some cough, no wheezing, mild SOB improving CARDIAC: No chest pain, palpitations, lower  extremity edema  GI: No abdominal pain, No N/V/D or constipation, No heartburn or reflux  GU: No dysuria, frequency or urgency, or incontinence  MUSCULOSKELETAL: No unrelieved bone/joint pain NEUROLOGIC: No headache, dizziness or focal weakness PSYCHIATRIC: No overt anxiety or sadness, No behavior issue.   Filed Vitals:   03/28/14 2146  BP: 143/83  Pulse: 81  Temp: 97.6 F (36.4 C)  Resp: 20    Physical Exam  GENERAL APPEARANCE: Alert, conversant,  No acute distress.  SKIN: No diaphoresis rash HEAD: Normocephalic, atraumatic  EYES: Conjunctiva/lids clear. Pupils round, reactive. EOMs intact.  EARS: External exam WNL, canals clear. Hearing grossly normal.  NOSE: No deformity or discharge.  MOUTH/THROAT: Lips w/o lesions  RESPIRATORY: Breathing is even, unlabored. Lung sounds are mild decreased post, no wheeze   CARDIOVASCULAR: Heart RRR no murmurs, rubs or gallops. No peripheral edema.   GASTROINTESTINAL: Abdomen is soft, non-tender, not distended w/ normal bowel sounds;PEG tube. GENITOURINARY: Bladder non tender, not distended ; foley MUSCULOSKELETAL: No abnormal joints or musculature NEUROLOGIC:  Cranial nerves 2-12 grossly intact. Moves all extremities  PSYCHIATRIC: Mood and affect appropriate to situation, no behavioral issues  Patient Active Problem List   Diagnosis Date Noted  . FTT (failure to thrive) in adult 03/30/2014  . Hematuria 03/10/2014  . Acute blood loss anemia 03/10/2014  . Counseling regarding advanced directives and goals of care 03/01/2014  . C. difficile diarrhea 02/27/2014  .  HCAP (healthcare-associated pneumonia) 02/26/2014  . AAA (abdominal aortic aneurysm)   . Bacteremia   . Proteus infection   . Sepsis 02/24/2014  . Fever presenting with conditions classified elsewhere 02/24/2014  . Palliative care encounter   . Acute on chronic combined systolic (EF 37%) and grade 1 diastolic congestive heart failure 02/22/2014  . Recurrent bacteremia 02/22/2014  . CKD (chronic kidney disease), stage III 02/22/2014  . Septic shock 02/21/2014  . History of Bacteremia due to Pseudomonas 02/21/2014  . Infected aortic graft 02/21/2014  . Stage III chronic kidney disease 02/21/2014  . Dysphagia 02/21/2014  . Status post insertion of percutaneous endoscopic gastrostomy (PEG) tube 02/21/2014  . Hypokalemia 02/10/2014  . Protein-calorie malnutrition, severe 02/08/2014  . Rash and nonspecific skin eruption 02/02/2014  . Supratherapeutic INR 05/17/2013  . Anticoagulation management encounter 05/15/2013  . Aortic valve disorders   . Other primary cardiomyopathies   . 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 Thrombocytopenia 04/30/2013  . Renal stone 04/25/2013  . Osteoarthritis of right knee 07/26/2012  . Weakness of right leg 05/19/2012  . HTN (hypertension) 05/19/2012    CBC    Component Value Date/Time   WBC 5.0 03/26/2014 0550   WBC 5.8 12/26/2013 1105   WBC 5.4 02/28/2007 0919   RBC 3.19* 03/26/2014 0550   RBC 3.62* 02/21/2014 1127   RBC 4.10* 12/26/2013 1105   RBC 5.21 02/28/2007 0919   HGB 8.5* 03/26/2014 0550   HGB 11.0* 12/26/2013 1105   HGB 15.0 02/28/2007 0919   HCT 26.4* 03/26/2014 0550   HCT 34.5* 12/26/2013 1105   HCT 44.0 02/28/2007 0919   PLT 86* 03/26/2014 0550   PLT 157 12/26/2013 1105   PLT 153 02/28/2007 0919   MCV 82.8 03/26/2014 0550   MCV 84 12/26/2013 1105   MCV 84.4 02/28/2007 0919   LYMPHSABS 0.6* 03/23/2014 1455   LYMPHSABS 0.9 12/26/2013 1105   LYMPHSABS 1.6 02/28/2007 0919   MONOABS 0.8  03/23/2014 1455   MONOABS 0.6 02/28/2007 0919  EOSABS 0.0 03/23/2014 1455   EOSABS 0.1 12/26/2013 1105   EOSABS 0.3 02/28/2007 0919   BASOSABS 0.0 03/23/2014 1455   BASOSABS 0.0 12/26/2013 1105   BASOSABS 0.0 02/28/2007 0919    CMP     Component Value Date/Time   NA 134* 03/26/2014 0550   NA 140 10/31/2013 0839   K 3.9 03/26/2014 0550   K 3.9 10/31/2013 0839   CL 109 03/26/2014 0550   CL 103 10/31/2013 0839   CO2 20 03/26/2014 0550   CO2 27 10/31/2013 0839   GLUCOSE 113* 03/26/2014 0550   GLUCOSE 93 10/31/2013 0839   BUN 16 03/26/2014 0550   BUN 17 10/31/2013 0839   CREATININE 0.81 03/26/2014 0550   CREATININE 1.1 10/31/2013 0839   CALCIUM 8.1* 03/26/2014 0550   CALCIUM 8.9 10/31/2013 0839   PROT 5.1* 03/26/2014 0550   ALBUMIN 1.9* 03/26/2014 0550   AST 19 03/26/2014 0550   ALT 8 03/26/2014 0550   ALKPHOS 49 03/26/2014 0550   BILITOT 0.4 03/26/2014 0550   GFRNONAA 81* 03/26/2014 0550   GFRAA >90 03/26/2014 0550    Assessment and Plan  HCAP (healthcare-associated pneumonia) LLL-Blood cultures negative to date -Chest x ray with lingular infiltrates.  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  -Last admission he was seen by palliative care, family wishes to continue with care.  -Finished meropenem and rifampin December 24 for recurrent bacteriemia, Last admission to hospital was 02-28-2014 for the same/ -Will continue levaquin 750 mg daily forever   Hematuria A recurring issue  -previously required continuous  bladder irrigation per Urology - recently restarted ASA + Plavix which were discontinued  -Will need to followup with PCP regarding restarting aspirin and plavix -Hb has remained stable   C. difficile diarrhea PCR negative at this time but prior plan was to continue "indefinite" oral vanc until recheck by ID at [facility] (Dr. Vista Deck)  -continue oral vanc   Protein-calorie malnutrition, severe 40+ lb wt loss in last 4 mo - tube feedings - able to tolerate oral intake for pleasure    Status post insertion of percutaneous endoscopic gastrostomy (PEG) tube For FTT and protein malnutrition   FTT (failure to thrive) in adult With dysphagia - tube feeds   Dysphagia Tube feeds with po for pleasure   Acute on chronic combined systolic (EF 96%) and grade 1 diastolic congestive heart failure -Currently compensated -Resume metoprolol   CKD (chronic kidney disease), stage III Baseline renal function BUN 26 creatinine 1.41 - crt currently better that his baseline    Thrombocytopenia Stable PLT   Acute blood loss anemia From GU bleeding but Hb stable 8.5   History of DVT of lower extremity (Feb 2015) -post IVC filter Noted   HTN (hypertension) Cont cardura and metoprolol     Hennie Duos, MD

## 2014-03-29 LAB — CULTURE, BLOOD (ROUTINE X 2)
Culture: NO GROWTH
Culture: NO GROWTH

## 2014-03-30 ENCOUNTER — Encounter: Payer: Self-pay | Admitting: Internal Medicine

## 2014-03-30 DIAGNOSIS — R627 Adult failure to thrive: Secondary | ICD-10-CM | POA: Insufficient documentation

## 2014-03-30 NOTE — Assessment & Plan Note (Signed)
40+ lb wt loss in last 4 mo - tube feedings - able to tolerate oral intake for pleasure

## 2014-03-30 NOTE — Assessment & Plan Note (Signed)
Cont cardura and metoprolol

## 2014-03-30 NOTE — Assessment & Plan Note (Signed)
PCR negative at this time but prior plan was to continue "indefinite" oral vanc until recheck by ID at [facility] (Dr. Vista Deck)  -continue oral vanc

## 2014-03-30 NOTE — Assessment & Plan Note (Signed)
Stable PLT

## 2014-03-30 NOTE — Assessment & Plan Note (Signed)
With dysphagia - tube feeds

## 2014-03-30 NOTE — Assessment & Plan Note (Signed)
-  Currently compensated -Resume metoprolol

## 2014-03-30 NOTE — Assessment & Plan Note (Signed)
For FTT and protein malnutrition

## 2014-03-30 NOTE — Assessment & Plan Note (Signed)
LLL-Blood cultures negative to date -Chest x ray with lingular infiltrates.  -Initially placed on vancomycin and Meropenem, but will be transitioned to levaquinLL;

## 2014-03-30 NOTE — Assessment & Plan Note (Signed)
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  -Last admission he was seen by palliative care, family wishes to continue with care.  -Finished meropenem and rifampin December 24 for recurrent bacteriemia, Last admission to hospital was 02-28-2014 for the same/ -Will continue levaquin 750 mg daily forever

## 2014-03-30 NOTE — Assessment & Plan Note (Signed)
Baseline renal function BUN 26 creatinine 1.41 - crt currently better that his baseline

## 2014-03-30 NOTE — Assessment & Plan Note (Signed)
Noted  

## 2014-03-30 NOTE — Assessment & Plan Note (Signed)
Tube feeds with po for pleasure

## 2014-03-30 NOTE — Assessment & Plan Note (Signed)
From GU bleeding but Hb stable 8.5

## 2014-03-30 NOTE — Assessment & Plan Note (Signed)
A recurring issue  -previously required continuous bladder irrigation per Urology - recently restarted ASA + Plavix which were discontinued  -Will need to followup with PCP regarding restarting aspirin and plavix -Hb has remained stable

## 2014-04-02 ENCOUNTER — Non-Acute Institutional Stay (SKILLED_NURSING_FACILITY): Payer: Medicare Other | Admitting: Nurse Practitioner

## 2014-04-02 DIAGNOSIS — R339 Retention of urine, unspecified: Secondary | ICD-10-CM

## 2014-04-02 DIAGNOSIS — N3281 Overactive bladder: Secondary | ICD-10-CM

## 2014-04-02 NOTE — Progress Notes (Signed)
Patient ID: [name], male   DOB: Mar 07, 1930, 81 y.o.   MRN: [id number]    Nursing Home Location:  [place] of Service: SNF (42)  PCP: [name], MD  Allergies  Allergen Reactions  . Atorvastatin Other (See Comments)    Causes constipation  . Penicillins Other (See Comments)    Other reaction(s): RASH HIVES     Chief Complaint  Patient presents with  . Acute Visit    HPI:  Patient is a 81 y.o. male seen today at [facility] at the request of nursing. 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 pt and family have been requesting that pt be placed back on detrol which was a home med prior to hospitalization.during pts hospitalization from 12/10-12/18 he was taken off detrol due to acute urinary retention. Now pt reports constant urination and not being able to control his bladder.   Review of Systems:  Review of Systems  Constitutional: Negative for activity change, fatigue and unexpected weight change.  HENT: Positive for hearing loss.   Respiratory: Negative for cough and shortness of breath.   Gastrointestinal: Negative for abdominal pain, diarrhea and constipation.  Genitourinary: Positive for urgency and frequency. Negative for dysuria and difficulty urinating.    Past Medical History  Diagnosis Date  . Hypertension   . BPH (benign prostatic hyperplasia)   . Hyperlipidemia   . Cancer 2008-2009    prostate TREATED WITH RADIATION  . AAA (abdominal aortic aneurysm) 04/2012    STENTING OF AAA IN [place]  . Junctional cardiac arrhythmia     Occurred postoperatively after urologic surgery  . Severe sinus bradycardia     Occurred postoperatively after urologic surgery  . Aortic valve disorders   . Other primary cardiomyopathies   . Dysphagia   . Status post insertion of percutaneous endoscopic gastrostomy (PEG) tube 02/11/14  . Bacteremia  due to Pseudomonas   . Infected aortic graft    Past Surgical History  Procedure Laterality Date  . Abdominal surgery      PART OF COLON REMOVED FOR DIVERTICULITIS  . Appendectomy    . Hernia repair    . Bladder surgery  2008    FOR BLADDER STONE  . Total knee arthroplasty  1990    left  . Abdominal aortic aneurysm repair  04/2012  . Total knee arthroplasty Right 07/26/2012    Procedure: RIGHT TOTAL KNEE ARTHROPLASTY;  Surgeon: Tobi Bastos, MD;  [facility];  Service: Orthopedics;  Laterality: Right;  . Nephrolithotomy Left 04/26/2013    Procedure: LEFT PERCUTANEOUS NEPHROLITHOTOMY ;  Surgeon: [name], MD;  Location: [facility];  Service: Urology;  Laterality: Left;  . Nephrolithotomy Left 05/03/2013    Procedure: 2ND STAGE LEFT PERCUTANEOUS NEPHROLITHOTOMY ;  Surgeon: [name], MD;  Location: [facility];  Service: Urology;  Laterality: Left;  . Esophagogastroduodenoscopy N/A 02/11/2014    TGG:YIRSW hiatal hernia. Patchy erythema of the gastric mucosa; otherwise negative EGD. Status post 14 French Microvasive PEG tube placement  . Peg placement N/A 02/11/2014    Procedure: PERCUTANEOUS ENDOSCOPIC GASTROSTOMY (PEG) PLACEMENT;  Surgeon: Daneil Dolin, MD;  [facility];  Service: Endoscopy;  Laterality: N/A;   Social History:   reports that he quit smoking about 29 years ago. His smoking use included Cigarettes. He started smoking about 65 years ago. He has a 74 pack-year smoking  history. He has never used smokeless tobacco. He reports that he does not drink alcohol or use illicit drugs.  Family History  Problem Relation Age of Onset  . Lung cancer Father     Medications: Patient's Medications  New Prescriptions   No medications on file  Previous Medications   ACETAMINOPHEN (TYLENOL) 500 MG TABLET    Take 1,000 mg by mouth every 6 (six) hours as needed for mild pain or moderate pain.   ALBUTEROL (PROVENTIL) (2.5 MG/3ML) 0.083% NEBULIZER SOLUTION    Take 3 mLs (2.5  mg total) by nebulization every 2 (two) hours as needed for wheezing or shortness of breath.   ALUM & MAG HYDROXIDE-SIMETH (MAGIC MOUTHWASH) SOLN    Take 5 mLs by mouth 4 (four) times daily. 10 day regimen started 03/22/14   AMINO ACIDS-PROTEIN HYDROLYS (FEEDING SUPPLEMENT, PRO-STAT SUGAR FREE 64,) LIQD    Take 30 mLs by mouth 3 (three) times daily with meals.   DOCUSATE SODIUM (COLACE) 100 MG CAPSULE    Take 100 mg by mouth daily as needed for mild constipation.   DOXAZOSIN (CARDURA) 4 MG TABLET    Take 4 mg by mouth at bedtime.   FAMOTIDINE (PEPCID) 40 MG/5ML SUSPENSION    Place 5 mLs (40 mg total) into feeding tube daily.   LEVOFLOXACIN (LEVAQUIN) 750 MG TABLET    Please resume oral levaquin when IV Meropenem is complete on 12/25.  Continue indefintely for suppression of infection.  This medication will be managed by Infectious Disease at [facility].   LORAZEPAM (ATIVAN) 2 MG/ML CONCENTRATED SOLUTION    Take 0.1 mLs (0.2 mg total) by mouth every 6 (six) hours as needed for anxiety.   METHYLPHENIDATE (RITALIN) 5 MG TABLET    Take 1 tablet (5 mg total) by mouth daily.   METOPROLOL SUCCINATE (TOPROL-XL) 25 MG 24 HR TABLET    Take 25 mg by mouth daily.   MIRTAZAPINE (REMERON) 30 MG TABLET    Take 30 mg by mouth at bedtime.   NUTRITIONAL SUPPLEMENTS (FEEDING SUPPLEMENT, JEVITY 1.2 CAL,) LIQD    Place 1,000 mLs into feeding tube continuous.   RIFAMPIN (RIFADIN) 300 MG CAPSULE    Take 300 mg by mouth every 12 (twelve) hours.   SIMVASTATIN (ZOCOR) 20 MG TABLET    Take 20 mg by mouth daily.   TRAMADOL (ULTRAM) 50 MG TABLET    Take 1 tablet (50 mg total) by mouth every 6 (six) hours as needed for moderate pain.   TRIAMCINOLONE CREAM (KENALOG) 0.1 %    Apply 1 application topically 2 (two) times daily as needed (rash).   VANCOMYCIN (VANCOCIN) 50 MG/ML ORAL SOLUTION    For C-Diff.  Continue indefinitely until patient is seen in follow up at [facility] Infectious Disease. (as the patient will be on continuous Levaquin).    WATER FOR IRRIGATION, STERILE (FREE WATER) SOLN    Place 100 mLs into feeding tube every 8 (eight) hours.  Modified Medications   No medications on file  Discontinued Medications   No medications on file     Physical Exam: Filed Vitals:   04/02/14 1355  BP: 128/66  Pulse: 88  Temp: 97 F (36.1 C)  Resp: 20    Physical Exam  Constitutional: No distress.  Cardiovascular: Normal rate, regular rhythm and normal heart sounds.   Pulmonary/Chest: Effort normal and breath sounds normal.  Abdominal: Soft. Bowel sounds are normal. He exhibits no distension. There is no tenderness.  Neurological: He is alert.  Skin:  Skin is warm and dry. He is not diaphoretic.    Labs reviewed: Basic Metabolic Panel:  Recent Labs  02/08/14 1146  02/10/14 0652 02/11/14 0603  02/23/14 0049  03/24/14 0319 03/25/14 0210 03/26/14 0550  NA  --   < >  --  146  < > 138  < > 140 138 134*  K  --   < >  --  3.7  < > 3.9  < > 3.6 3.4* 3.9  CL  --   < >  --  109  < > 107  < > 111 110 109  CO2  --   < >  --  27  < > 19  < > 25 24 20   GLUCOSE  --   < >  --  93  < > 107*  < > 101* 112* 113*  BUN  --   < >  --  26*  < > 31*  < > 20 16 16   CREATININE  --   < >  --  1.41*  < > 1.10  < > 1.17 0.94 0.81  CALCIUM  --   < >  --  9.5  < > 8.6  < > 8.0* 8.2* 8.1*  MG  --   --  1.6  --   --  1.8  --   --   --  2.0  PHOS 2.2*  --   --  2.2*  --  2.4  --   --   --   --   < > = values in this interval not displayed. Liver Function Tests:  Recent Labs  03/24/14 0319 03/25/14 0210 03/26/14 0550  AST 23 17 19   ALT 8 8 8   ALKPHOS 45 51 49  BILITOT 0.4 0.3 0.4  PROT 4.9* 4.9* 5.1*  ALBUMIN 1.9* 1.9* 1.9*    Recent Labs  02/06/14 1454  LIPASE 69*   No results for input(s): AMMONIA in the last 8760 hours. CBC:  Recent Labs  02/21/14 1127  02/28/14 0934 03/23/14 1455 03/24/14 0319 03/25/14 0210 03/26/14 0550  WBC 3.4*  < > 4.6 8.2 6.5 5.3 5.0  NEUTROABS 3.0  --  2.7 6.8  --   --   --   HGB 9.5*  <  > 8.0* 9.2* 7.3* 7.9* 8.5*  HCT 29.5*  < > 24.7* 28.2* 22.8* 24.6* 26.4*  MCV 82.6  < > 82.9 82.5 86.7 84.2 82.8  PLT 103*  < > 103* 113* 99* 98* 86*  < > = values in this interval not displayed. TSH:  Recent Labs  02/06/14 1454  TSH 3.000   A1C: No results found for: HGBA1C Lipid Panel: No results for input(s): CHOL, HDL, LDLCALC, TRIG, CHOLHDL, LDLDIRECT in the last 8760 hours.    Assessment/Plan  1. Urinary retention Pt was previously on detrol for many years with good effects however during recent hospitalization had acute urinary retention and it was dc'd.   2. Overactive bladder currently worse now that he is off detrol, pt previously was seen by Dr Roni Bread, urologist.  will refer to urology for recs since other medications to help with OAB can also lead to urinary retention.

## 2014-04-08 ENCOUNTER — Other Ambulatory Visit: Payer: Self-pay | Admitting: *Deleted

## 2014-04-08 MED ORDER — METHYLPHENIDATE HCL 5 MG PO TABS: 5.0000 mg | ORAL_TABLET | Freq: Every day | ORAL | Status: DC

## 2014-04-08 NOTE — Telephone Encounter (Signed)
[facility] 

## 2014-04-09 ENCOUNTER — Ambulatory Visit (INDEPENDENT_AMBULATORY_CARE_PROVIDER_SITE_OTHER): Payer: Medicare Other | Admitting: Infectious Diseases

## 2014-04-09 ENCOUNTER — Encounter (HOSPITAL_COMMUNITY): Payer: Self-pay

## 2014-04-09 ENCOUNTER — Encounter: Payer: Self-pay | Admitting: Infectious Diseases

## 2014-04-09 ENCOUNTER — Inpatient Hospital Stay (HOSPITAL_COMMUNITY)
Admission: EM | Admit: 2014-04-09 | Discharge: 2014-04-15 | DRG: 871 | Disposition: A | Discharge status: Skilled Nursing Facility | Payer: Medicare Other | Attending: Internal Medicine | Admitting: Internal Medicine

## 2014-04-09 ENCOUNTER — Non-Acute Institutional Stay (SKILLED_NURSING_FACILITY): Payer: Medicare Other | Admitting: Nurse Practitioner

## 2014-04-09 ENCOUNTER — Emergency Department (HOSPITAL_COMMUNITY): Payer: Medicare Other

## 2014-04-09 VITALS — BP 84/54 | HR 105 | Temp 97.5°F

## 2014-04-09 DIAGNOSIS — R1310 Dysphagia, unspecified: Secondary | ICD-10-CM | POA: Diagnosis present

## 2014-04-09 DIAGNOSIS — D696 Thrombocytopenia, unspecified: Secondary | ICD-10-CM | POA: Diagnosis present

## 2014-04-09 DIAGNOSIS — Z8546 Personal history of malignant neoplasm of prostate: Secondary | ICD-10-CM | POA: Diagnosis not present

## 2014-04-09 DIAGNOSIS — E785 Hyperlipidemia, unspecified: Secondary | ICD-10-CM | POA: Diagnosis present

## 2014-04-09 DIAGNOSIS — Z6826 Body mass index (BMI) 26.0-26.9, adult: Secondary | ICD-10-CM | POA: Diagnosis not present

## 2014-04-09 DIAGNOSIS — R8271 Bacteriuria: Secondary | ICD-10-CM | POA: Diagnosis present

## 2014-04-09 DIAGNOSIS — Z515 Encounter for palliative care: Secondary | ICD-10-CM

## 2014-04-09 DIAGNOSIS — N183 Chronic kidney disease, stage 3 unspecified: Secondary | ICD-10-CM | POA: Diagnosis present

## 2014-04-09 DIAGNOSIS — D638 Anemia in other chronic diseases classified elsewhere: Secondary | ICD-10-CM | POA: Diagnosis present

## 2014-04-09 DIAGNOSIS — A0472 Enterocolitis due to Clostridium difficile, not specified as recurrent: Secondary | ICD-10-CM

## 2014-04-09 DIAGNOSIS — R197 Diarrhea, unspecified: Secondary | ICD-10-CM | POA: Diagnosis present

## 2014-04-09 DIAGNOSIS — E872 Acidosis, unspecified: Secondary | ICD-10-CM

## 2014-04-09 DIAGNOSIS — B965 Pseudomonas (aeruginosa) (mallei) (pseudomallei) as the cause of diseases classified elsewhere: Secondary | ICD-10-CM | POA: Diagnosis present

## 2014-04-09 DIAGNOSIS — R627 Adult failure to thrive: Secondary | ICD-10-CM | POA: Diagnosis present

## 2014-04-09 DIAGNOSIS — T827XXD Infection and inflammatory reaction due to other cardiac and vascular devices, implants and grafts, subsequent encounter: Secondary | ICD-10-CM

## 2014-04-09 DIAGNOSIS — R7881 Bacteremia: Secondary | ICD-10-CM

## 2014-04-09 DIAGNOSIS — N390 Urinary tract infection, site not specified: Secondary | ICD-10-CM

## 2014-04-09 DIAGNOSIS — I358 Other nonrheumatic aortic valve disorders: Secondary | ICD-10-CM | POA: Diagnosis present

## 2014-04-09 DIAGNOSIS — R778 Other specified abnormalities of plasma proteins: Secondary | ICD-10-CM

## 2014-04-09 DIAGNOSIS — J189 Pneumonia, unspecified organism: Secondary | ICD-10-CM

## 2014-04-09 DIAGNOSIS — I359 Nonrheumatic aortic valve disorder, unspecified: Secondary | ICD-10-CM

## 2014-04-09 DIAGNOSIS — N400 Enlarged prostate without lower urinary tract symptoms: Secondary | ICD-10-CM | POA: Diagnosis present

## 2014-04-09 DIAGNOSIS — D500 Iron deficiency anemia secondary to blood loss (chronic): Secondary | ICD-10-CM | POA: Diagnosis present

## 2014-04-09 DIAGNOSIS — A047 Enterocolitis due to Clostridium difficile: Secondary | ICD-10-CM

## 2014-04-09 DIAGNOSIS — IMO0001 Reserved for inherently not codable concepts without codable children: Secondary | ICD-10-CM | POA: Insufficient documentation

## 2014-04-09 DIAGNOSIS — Z931 Gastrostomy status: Secondary | ICD-10-CM | POA: Diagnosis not present

## 2014-04-09 DIAGNOSIS — N289 Disorder of kidney and ureter, unspecified: Secondary | ICD-10-CM

## 2014-04-09 DIAGNOSIS — Z96653 Presence of artificial knee joint, bilateral: Secondary | ICD-10-CM | POA: Diagnosis present

## 2014-04-09 DIAGNOSIS — I129 Hypertensive chronic kidney disease with stage 1 through stage 4 chronic kidney disease, or unspecified chronic kidney disease: Secondary | ICD-10-CM | POA: Diagnosis present

## 2014-04-09 DIAGNOSIS — I5042 Chronic combined systolic (congestive) and diastolic (congestive) heart failure: Secondary | ICD-10-CM | POA: Diagnosis present

## 2014-04-09 DIAGNOSIS — I248 Other forms of acute ischemic heart disease: Secondary | ICD-10-CM | POA: Diagnosis present

## 2014-04-09 DIAGNOSIS — Y95 Nosocomial condition: Secondary | ICD-10-CM | POA: Diagnosis present

## 2014-04-09 DIAGNOSIS — R7989 Other specified abnormal findings of blood chemistry: Secondary | ICD-10-CM

## 2014-04-09 DIAGNOSIS — Z923 Personal history of irradiation: Secondary | ICD-10-CM

## 2014-04-09 DIAGNOSIS — Z87891 Personal history of nicotine dependence: Secondary | ICD-10-CM | POA: Diagnosis not present

## 2014-04-09 DIAGNOSIS — Z86718 Personal history of other venous thrombosis and embolism: Secondary | ICD-10-CM

## 2014-04-09 DIAGNOSIS — I959 Hypotension, unspecified: Secondary | ICD-10-CM | POA: Diagnosis present

## 2014-04-09 DIAGNOSIS — I9589 Other hypotension: Secondary | ICD-10-CM

## 2014-04-09 DIAGNOSIS — A419 Sepsis, unspecified organism: Secondary | ICD-10-CM | POA: Diagnosis present

## 2014-04-09 DIAGNOSIS — E43 Unspecified severe protein-calorie malnutrition: Secondary | ICD-10-CM | POA: Diagnosis present

## 2014-04-09 LAB — URINALYSIS, ROUTINE W REFLEX MICROSCOPIC
GLUCOSE, UA: NEGATIVE mg/dL
Hgb urine dipstick: NEGATIVE
KETONES UR: 15 mg/dL — AB
Nitrite: POSITIVE — AB
PH: 5 (ref 5.0–8.0)
PROTEIN: 30 mg/dL — AB
Specific Gravity, Urine: 1.027 (ref 1.005–1.030)
Urobilinogen, UA: 1 mg/dL (ref 0.0–1.0)

## 2014-04-09 LAB — BASIC METABOLIC PANEL
Anion gap: 14 (ref 5–15)
BUN: 43 mg/dL — AB (ref 6–23)
CHLORIDE: 102 mmol/L (ref 96–112)
CO2: 25 mmol/L (ref 19–32)
Calcium: 9.7 mg/dL (ref 8.4–10.5)
Creatinine, Ser: 2 mg/dL — ABNORMAL HIGH (ref 0.50–1.35)
GFR calc Af Amer: 34 mL/min — ABNORMAL LOW (ref >90)
GFR calc non Af Amer: 29 mL/min — ABNORMAL LOW (ref >90)
Glucose, Bld: 128 mg/dL — ABNORMAL HIGH (ref 70–99)
Potassium: 5 mmol/L (ref 3.5–5.1)
Sodium: 141 mmol/L (ref 135–145)

## 2014-04-09 LAB — URINE MICROSCOPIC-ADD ON

## 2014-04-09 LAB — CBC WITH DIFFERENTIAL/PLATELET
BASOS ABS: 0 10*3/uL (ref 0.0–0.1)
Basophils Relative: 0 % (ref 0–1)
EOS ABS: 0 10*3/uL (ref 0.0–0.7)
EOS PCT: 0 % (ref 0–5)
HCT: 35.2 % — ABNORMAL LOW (ref 39.0–52.0)
Hemoglobin: 10.9 g/dL — ABNORMAL LOW (ref 13.0–17.0)
Lymphocytes Relative: 11 % — ABNORMAL LOW (ref 12–46)
Lymphs Abs: 1.3 10*3/uL (ref 0.7–4.0)
MCH: 27.3 pg (ref 26.0–34.0)
MCHC: 31 g/dL (ref 30.0–36.0)
MCV: 88.2 fL (ref 78.0–100.0)
Monocytes Absolute: 1.1 10*3/uL — ABNORMAL HIGH (ref 0.1–1.0)
Monocytes Relative: 9 % (ref 3–12)
Neutro Abs: 9.8 10*3/uL — ABNORMAL HIGH (ref 1.7–7.7)
Neutrophils Relative %: 80 % — ABNORMAL HIGH (ref 43–77)
Platelets: 103 10*3/uL — ABNORMAL LOW (ref 150–400)
RBC: 3.99 MIL/uL — ABNORMAL LOW (ref 4.22–5.81)
RDW: 18.6 % — AB (ref 11.5–15.5)
WBC: 12.2 10*3/uL — ABNORMAL HIGH (ref 4.0–10.5)

## 2014-04-09 LAB — PROTIME-INR
INR: 1.29 (ref 0.00–1.49)
Prothrombin Time: 16.2 seconds — ABNORMAL HIGH (ref 11.6–15.2)

## 2014-04-09 LAB — I-STAT CG4 LACTIC ACID, ED
LACTIC ACID, VENOUS: 1.9 mmol/L (ref 0.5–2.0)
Lactic Acid, Venous: 4.21 mmol/L (ref 0.5–2.0)
Lactic Acid, Venous: 3.09 mmol/L (ref 0.5–2.0)

## 2014-04-09 LAB — POC OCCULT BLOOD, ED: Fecal Occult Bld: NEGATIVE

## 2014-04-09 LAB — I-STAT TROPONIN, ED: Troponin i, poc: 0.76 ng/mL (ref 0.00–0.08)

## 2014-04-09 LAB — BRAIN NATRIURETIC PEPTIDE: B Natriuretic Peptide: 1434.7 pg/mL — ABNORMAL HIGH (ref 0.0–100.0)

## 2014-04-09 MED ORDER — ACETAMINOPHEN 325 MG PO TABS
650.0000 mg | ORAL_TABLET | Freq: Four times a day (QID) | ORAL | Status: DC | PRN
  Administered 2014-04-11: 650 mg
  Filled 2014-04-09: qty 2

## 2014-04-09 MED ORDER — VANCOMYCIN HCL 10 G IV SOLR
1500.0000 mg | INTRAVENOUS | Status: AC
  Administered 2014-04-09: 1500 mg via INTRAVENOUS
  Filled 2014-04-09: qty 1500

## 2014-04-09 MED ORDER — FAMOTIDINE 40 MG/5ML PO SUSR
40.0000 mg | Freq: Every day | ORAL | Status: DC
  Administered 2014-04-10 – 2014-04-15 (×6): 40 mg
  Filled 2014-04-09 (×6): qty 5

## 2014-04-09 MED ORDER — SODIUM CHLORIDE 0.9 % IV BOLUS (SEPSIS): 500.0000 mL | INTRAVENOUS | Status: AC

## 2014-04-09 MED ORDER — VANCOMYCIN HCL IN DEXTROSE 750-5 MG/150ML-% IV SOLN
750.0000 mg | INTRAVENOUS | Status: DC
  Administered 2014-04-10: 750 mg via INTRAVENOUS
  Filled 2014-04-09: qty 150

## 2014-04-09 MED ORDER — SODIUM CHLORIDE 0.9 % IV SOLN
1.0000 g | Freq: Once | INTRAVENOUS | Status: AC
  Administered 2014-04-09: 1 g via INTRAVENOUS
  Filled 2014-04-09: qty 1

## 2014-04-09 MED ORDER — ACETAMINOPHEN 650 MG RE SUPP: 650.0000 mg | Freq: Four times a day (QID) | RECTAL | Status: DC | PRN

## 2014-04-09 MED ORDER — SODIUM CHLORIDE 0.9 % IV SOLN
INTRAVENOUS | Status: DC
  Administered 2014-04-09 – 2014-04-11 (×2): via INTRAVENOUS

## 2014-04-09 MED ORDER — ONDANSETRON HCL 4 MG PO TABS: 4.0000 mg | ORAL_TABLET | Freq: Four times a day (QID) | ORAL | Status: DC | PRN

## 2014-04-09 MED ORDER — SODIUM CHLORIDE 0.9 % IJ SOLN
3.0000 mL | Freq: Two times a day (BID) | INTRAMUSCULAR | Status: DC
  Administered 2014-04-10 – 2014-04-14 (×7): 3 mL via INTRAVENOUS

## 2014-04-09 MED ORDER — VANCOMYCIN 50 MG/ML ORAL SOLUTION
125.0000 mg | Freq: Four times a day (QID) | ORAL | Status: DC
  Administered 2014-04-10 (×3): 125 mg via ORAL
  Filled 2014-04-09 (×5): qty 2.5

## 2014-04-09 MED ORDER — FREE WATER
100.0000 mL | Freq: Three times a day (TID) | Status: DC
  Administered 2014-04-09 – 2014-04-15 (×17): 100 mL

## 2014-04-09 MED ORDER — ONDANSETRON HCL 4 MG/2ML IJ SOLN: 4.0000 mg | Freq: Four times a day (QID) | INTRAMUSCULAR | Status: DC | PRN

## 2014-04-09 MED ORDER — SODIUM CHLORIDE 0.9 % IV SOLN
1.0000 g | Freq: Two times a day (BID) | INTRAVENOUS | Status: DC
  Administered 2014-04-10: 1 g via INTRAVENOUS
  Filled 2014-04-09 (×2): qty 1

## 2014-04-09 MED ORDER — SODIUM CHLORIDE 0.9 % IV BOLUS (SEPSIS)
1000.0000 mL | INTRAVENOUS | Status: AC
  Administered 2014-04-09 (×2): 1000 mL via INTRAVENOUS

## 2014-04-09 MED ORDER — ALBUTEROL SULFATE (2.5 MG/3ML) 0.083% IN NEBU: 2.5000 mg | INHALATION_SOLUTION | RESPIRATORY_TRACT | Status: DC | PRN

## 2014-04-09 NOTE — Assessment & Plan Note (Signed)
Need to continue his suplementation.

## 2014-04-09 NOTE — H&P (Signed)
[facility] History and Physical  Patient: [name]  MRN: [id number]  DOB: 03-30-1929  DOS: the patient was seen and examined on 04/09/2014 PCP: [name], MD  Chief Complaint: hypotension and diarrhea   HPI: [name] is a 81 y.o. male with Past medical history of prostate cancer, AAA repair with graft infection and chronic endovascular leak resulting in anemia, history of DVT with IVC filter, Pseudomonas bacteremia, C. difficile in the past, dysphagia status post PEG tube placement, hypertension. The patient presented with complaints of diarrhea. The symptoms started earlier this morning and he has 5 bowel movements while he is here in the ER. He is Hemoccult negative. Patient did have on and off episodes of diarrhea and was checked for C. difficile 2 weeks ago it was negative. Patient is on multiple antibiotics chronically for his chronically infected Pseudomonas endovascular stent and also recurrent hospitalization with various infections. With his diarrhea he was also noted to have low blood pressure and therefore he was referred to ER. Patient complains of on and off chills, chronic cough with expectoration which is unchanged, with a rash on the left leg which is new. Pt denies any fever, headache, runny nose, neck pain, choking episodes, chest pain, palpitation, shortness of breath, orthopnea, PND, nausea, vomiting, abdominal pain, acid reflux, active bleeding, black color BM, burning urination, increase urinary frequency, fall, trauma or injury, dizziness, pedal edema, difficulty swallowing, focal neurological deficit. Patient also does of decreased appetite. No change in his tube feedings reported   The patient is coming from SNF. And at his baseline independent for most of his ADL.  Review of Systems: as mentioned in the history of present illness.  A Comprehensive review of the other systems is negative.  Past Medical History  Diagnosis Date  .  Hypertension   . BPH (benign prostatic hyperplasia)   . Hyperlipidemia   . Cancer 2008-2009    prostate TREATED WITH RADIATION  . AAA (abdominal aortic aneurysm) 04/2012    STENTING OF AAA IN [place]  . Junctional cardiac arrhythmia     Occurred postoperatively after urologic surgery  . Severe sinus bradycardia     Occurred postoperatively after urologic surgery  . Aortic valve disorders   . Other primary cardiomyopathies   . Dysphagia   . Status post insertion of percutaneous endoscopic gastrostomy (PEG) tube 02/11/14  . Bacteremia due to Pseudomonas   . Infected aortic graft    Past Surgical History  Procedure Laterality Date  . Abdominal surgery      PART OF COLON REMOVED FOR DIVERTICULITIS  . Appendectomy    . Hernia repair    . Bladder surgery  2008    FOR BLADDER STONE  . Total knee arthroplasty  1990    left  . Abdominal aortic aneurysm repair  04/2012  . Total knee arthroplasty Right 07/26/2012    Procedure: RIGHT TOTAL KNEE ARTHROPLASTY;  Surgeon: Tobi Bastos, MD;  Location: [facility];  Service: Orthopedics;  Laterality: Right;  . Nephrolithotomy Left 04/26/2013    Procedure: LEFT PERCUTANEOUS NEPHROLITHOTOMY ;  Surgeon: [name], MD;  Location: [facility];  Service: Urology;  Laterality: Left;  . Nephrolithotomy Left 05/03/2013    Procedure: 2ND STAGE LEFT PERCUTANEOUS NEPHROLITHOTOMY ;  Surgeon: [name], MD;  Location: [facility];  Service: Urology;  Laterality: Left;  . Esophagogastroduodenoscopy N/A 02/11/2014    WCB:JSEGB hiatal hernia. Patchy erythema of the gastric mucosa; otherwise negative EGD. Status post 31 French Microvasive PEG tube  placement  . Peg placement N/A 02/11/2014    Procedure: PERCUTANEOUS ENDOSCOPIC GASTROSTOMY (PEG) PLACEMENT;  Surgeon: Daneil Dolin, MD;  Location: [facility];  Service: Endoscopy;  Laterality: N/A;   Social History:  reports that he quit smoking about 29 years ago. His smoking use included Cigarettes. He started smoking  about 65 years ago. He has a 74 pack-year smoking history. He has never used smokeless tobacco. He reports that he does not drink alcohol or use illicit drugs.  Allergies  Allergen Reactions  . Atorvastatin Other (See Comments)    Causes constipation  . Penicillins Other (See Comments)    Other reaction(s): RASH HIVES     Family History  Problem Relation Age of Onset  . Lung cancer Father     Prior to Admission medications   Medication Sig Start Date End Date Taking? Authorizing Provider  acetaminophen (TYLENOL) 500 MG tablet Take 1,000 mg by mouth every 6 (six) hours as needed for mild pain or moderate pain.   Yes Historical Provider, MD  albuterol (PROVENTIL) (2.5 MG/3ML) 0.083% nebulizer solution Take 3 mLs (2.5 mg total) by nebulization every 2 (two) hours as needed for wheezing or shortness of breath. 02/28/14  Yes Marianne L York, PA-C  Amino Acids-Protein Hydrolys (FEEDING SUPPLEMENT, PRO-STAT SUGAR FREE 64,) LIQD Take 30 mLs by mouth 3 (three) times daily with meals. 03/27/14  Yes Maryann Mikhail, DO  doxazosin (CARDURA) 4 MG tablet Take 4 mg by mouth at bedtime.   Yes Historical Provider, MD  famotidine (PEPCID) 40 MG/5ML suspension Place 5 mLs (40 mg total) into feeding tube daily. 03/01/14  Yes Marianne L York, PA-C  lactulose (CHRONULAC) 10 GM/15ML solution Take 10 g by mouth daily. Every day per [facility]   Yes Historical Provider, MD  levofloxacin (LEVAQUIN) 750 MG tablet Please resume oral levaquin when IV Meropenem is complete on 12/25.  Continue indefintely for suppression of infection.  This medication will be managed by Infectious Disease at [facility]. Patient taking differently: Take 750 mg by mouth daily. Please resume oral levaquin when IV Meropenem is complete on 12/25.  Continue indefintely for suppression of infection.  This medication will be managed by Infectious Disease at [facility]. 02/28/14  Yes Bobby Rumpf York, PA-C  LORazepam (ATIVAN) 2 MG/ML concentrated solution Take 0.1 mLs  (0.2 mg total) by mouth every 6 (six) hours as needed for anxiety. 02/28/14  Yes Bobby Rumpf York, PA-C  methylphenidate (RITALIN) 5 MG tablet Take 1 tablet (5 mg total) by mouth daily. 04/08/14  Yes Tiffany L Reed, DO  metoprolol succinate (TOPROL-XL) 25 MG 24 hr tablet Take 25 mg by mouth daily.   Yes Historical Provider, MD  mirtazapine (REMERON) 30 MG tablet Take 30 mg by mouth at bedtime.   Yes Historical Provider, MD  Nutritional Supplements (FEEDING SUPPLEMENT, JEVITY 1.2 CAL,) LIQD Place 1,000 mLs into feeding tube continuous. 02/13/14  Yes Erline Hau, MD  rifampin (RIFADIN) 300 MG capsule Take 300 mg by mouth every 12 (twelve) hours.   Yes Historical Provider, MD  simvastatin (ZOCOR) 20 MG tablet Take 20 mg by mouth daily.   Yes Historical Provider, MD  tolterodine (DETROL LA) 4 MG 24 hr capsule Take 4 mg by mouth daily.   Yes Historical Provider, MD  traMADol (ULTRAM) 50 MG tablet Take 1 tablet (50 mg total) by mouth every 6 (six) hours as needed for moderate pain. 02/28/14  Yes Marianne L York, PA-C  triamcinolone cream (KENALOG) 0.1 % Apply  1 application topically 2 (two) times daily as needed (rash).   Yes Historical Provider, MD  vancomycin (VANCOCIN) 50 mg/mL oral solution For C-Diff.  Continue indefinitely until patient is seen in follow up at [facility] Infectious Disease. (as the patient will be on continuous Levaquin). Patient taking differently: Take 125 mg by mouth 4 (four) times daily. For C-Diff.  Continue indefinitely until patient is seen in follow up at [facility] Infectious Disease. (as the patient will be on continuous Levaquin). 02/28/14  Yes Bobby Rumpf York, PA-C  Water For Irrigation, Sterile (FREE WATER) SOLN Place 100 mLs into feeding tube every 8 (eight) hours. 02/28/14   Melton Alar, PA-C    Physical Exam: Filed Vitals:   04/09/14 2200 04/09/14 2215 04/09/14 2230 04/09/14 2300  BP: 83/56 92/52 104/61 98/42  Pulse: 91 89 86 86  Temp:    98.4 F (36.9 C)   TempSrc:    Core (Comment)  Resp: 18 23 21 20   Weight:    102 kg (224 lb 13.9 oz)  SpO2: 97% 94% 96% 99%    General: Alert, Awake and Oriented to Time, Place and Person. Appear in mild distress Eyes: PERRL ENT: Oral Mucosa clear moist. Neck: no JVD Cardiovascular: S1 and S2 Present, aortic systolic  Murmur, Peripheral Pulses Present Respiratory: Bilateral Air entry equal and Decreased, bilateral basal right more than left Crackles, no wheezes Abdomen: Bowel SoundPresentft and non tender Skin: diffuse reticular Rash on the left leg without any warmth or tenderness  Extremities: no Pedal edema, no calf tenderness Neurologic: Grossly no focal neuro deficit.  Labs on Admission:  CBC:  Recent Labs Lab 04/09/14 1639  WBC 12.2*  NEUTROABS 9.8*  HGB 10.9*  HCT 35.2*  MCV 88.2  PLT 103*    CMP     Component Value Date/Time   NA 141 04/09/2014 1639   NA 140 10/31/2013 0839   K 5.0 04/09/2014 1639   K 3.9 10/31/2013 0839   CL 102 04/09/2014 1639   CL 103 10/31/2013 0839   CO2 25 04/09/2014 1639   CO2 27 10/31/2013 0839   GLUCOSE 128* 04/09/2014 1639   GLUCOSE 93 10/31/2013 0839   BUN 43* 04/09/2014 1639   BUN 17 10/31/2013 0839   CREATININE 2.00* 04/09/2014 1639   CREATININE 1.1 10/31/2013 0839   CALCIUM 9.7 04/09/2014 1639   CALCIUM 8.9 10/31/2013 0839   PROT 5.1* 03/26/2014 0550   ALBUMIN 1.9* 03/26/2014 0550   AST 19 03/26/2014 0550   ALT 8 03/26/2014 0550   ALKPHOS 49 03/26/2014 0550   BILITOT 0.4 03/26/2014 0550   GFRNONAA 29* 04/09/2014 1639   GFRAA 34* 04/09/2014 1639    No results for input(s): LIPASE, AMYLASE in the last 168 hours.  No results for input(s): CKTOTAL, CKMB, CKMBINDEX, TROPONINI in the last 168 hours. BNP (last 3 results) No results for input(s): PROBNP in the last 8760 hours.  Radiological Exams on Admission: Dg Chest Port 1 View  04/09/2014   CLINICAL DATA:  Altered mental status  EXAM: PORTABLE CHEST - 1 VIEW  COMPARISON:  Portable  exam 1801 hr compared to 03/24/2014  FINDINGS: Atherosclerotic calcification aorta with prior aortic stenting.  Stable heart size and mediastinal contours.  Increased LEFT basilar atelectasis versus infiltrate.  Mild persistent opacity in RIGHT lower lobe little changed.  Upper lungs clear.  No gross pleural effusion or pneumothorax.  IMPRESSION: Persistent RIGHT basilar opacity with increased atelectasis versus infiltrate in LEFT lower lobe.   Electronically Signed  By: Lavonia Dana M.D.   On: 04/09/2014 18:16    EKG: Independently reviewed. Atrial flutter .  Assessment/Plan Principal Problem:   Sepsis Active Problems:   History of DVT of lower extremity (Feb 2015) -post IVC filter   Aortic valve disorder   History of Bacteremia due to Pseudomonas   Dysphagia   Status post insertion of percutaneous endoscopic gastrostomy (PEG) tube   CKD (chronic kidney disease), stage III   HCAP (healthcare-associated pneumonia)   FTT (failure to thrive) in adult   Hypotension   Elevated troponin   1. Sepsis the patient is presenting with episodes of hypotension with diarrhea. His chest x-ray has been read as new left-sided infiltrate which could possibly represent healthcare associated pneumonia. He is on multiple antibiotic and could possibly also have C. difficile. With this at present C. difficile is sent and blood cultures are sent. Infectious diseases was consulted who will be following up with the patient and recommended to start the patient on vancomycin and meropenem which will be continued. Patient will be receiving IV hydration since he has improved significantly from lactic acid level more than 4 lactic acid level I.9 after IV hydration. We will continue close monitoring of his urine output as well as and therefore he has received a Foley catheter.  2. elevated troponin. Most likely in the setting of sepsis and she hypotension, represent demand ischemia. EKG does not show any evidence of  acute ischemia not the patient has any complaints of chest pain heaviness tightness or shortness of breath. Continue serial troponin all pain echo cardiogram in the morning. Resume aspirin and Plavix which were held earlier due to his hematuria which was thought to be secondary to trauma.  3. chronic kidney disease. Serum creatinine stable. Continue close monitoring. Avoid nephrotoxic medication.  4. diarrhea. Possibility of C. difficile cannot be ruled out although possibility of do feeding causing diarrhea also cannot be ruled out. At present holding tube present we'll consult dietitian in the morning.  Advance goals of care discussion: DO NOT INTUBATE    Consults: Infectious disease  DVT Prophylaxis: mechanical  compression device Nutrition: npo  Family Communication: Daughter  was present at bedside, opportunity was given to ask question and all questions were answered satisfactorily at the time of interview. Disposition: Admitted to inpatient in step-down unit.  Author: Berle Mull, MD [facility] Pager: [phone] 04/09/2014, 11:55 PM    If 7PM-7AM, please contact night-coverage [email or web address] Password [email or web address]

## 2014-04-09 NOTE — ED Notes (Addendum)
Pt sent from [facility] to Dr. Johnnye Sima office for follow up appt.  Pt was sent here for low BP.  Pt has had diarrhea since Peg tube placed several months ago. No other s/s noted.

## 2014-04-09 NOTE — ED Notes (Signed)
Attempted to call report x 1  

## 2014-04-09 NOTE — Assessment & Plan Note (Signed)
Will be eval in ED. If his pcr is (-), would stop his vanco.

## 2014-04-09 NOTE — ED Notes (Signed)
Notified MD of pts BP and critical lab values

## 2014-04-09 NOTE — ED Notes (Signed)
EDP at bedside  

## 2014-04-09 NOTE — Progress Notes (Signed)
ANTIBIOTIC CONSULT NOTE - INITIAL  Pharmacy Consult for Vancomycin and Meropenem Indication: rule out sepsis  Allergies  Allergen Reactions  . Atorvastatin Other (See Comments)    Causes constipation  . Penicillins Other (See Comments)    Other reaction(s): RASH HIVES     Patient Measurements: Weight: 224 lb 13.9 oz (102 kg)  Vital Signs: Temp: 98.4 F (36.9 C) (01/26 2300) Temp Source: Core (Comment) (01/26 2300) BP: 98/42 mmHg (01/26 2300) Pulse Rate: 86 (01/26 2300) Intake/Output from previous day:   Intake/Output from this shift:    Labs:  Recent Labs  04/09/14 1639  WBC 12.2*  HGB 10.9*  PLT 103*  CREATININE 2.00*   Estimated Creatinine Clearance: 36.3 mL/min (by C-G formula based on Cr of 2). No results for input(s): VANCOTROUGH, VANCOPEAK, VANCORANDOM, GENTTROUGH, GENTPEAK, GENTRANDOM, TOBRATROUGH, TOBRAPEAK, TOBRARND, AMIKACINPEAK, AMIKACINTROU, AMIKACIN in the last 72 hours.   Microbiology: Recent Results (from the past 720 hour(s))  Blood Culture (routine x 2)     Status: None   Collection Time: 03/23/14  2:41 PM  Result Value Ref Range Status   Specimen Description BLOOD RIGHT ARM  Final   Special Requests BOTTLES DRAWN AEROBIC AND ANAEROBIC 5CC  Final   Culture   Final    NO GROWTH 5 DAYS Performed at [facility]    Report Status 03/29/2014 FINAL  Final  Blood Culture (routine x 2)     Status: None   Collection Time: 03/23/14  2:59 PM  Result Value Ref Range Status   Specimen Description BLOOD LEFT ARM  Final   Special Requests BOTTLES DRAWN AEROBIC AND ANAEROBIC 5CC  Final   Culture   Final    NO GROWTH 5 DAYS Performed at [facility]    Report Status 03/29/2014 FINAL  Final  Urine culture     Status: None   Collection Time: 03/23/14  5:47 PM  Result Value Ref Range Status   Specimen Description URINE, CLEAN CATCH  Final   Special Requests NONE  Final   Colony Count NO GROWTH Performed at [facility]   Final    Culture NO GROWTH Performed at [facility]   Final   Report Status 03/25/2014 FINAL  Final  MRSA PCR Screening     Status: None   Collection Time: 03/23/14  7:16 PM  Result Value Ref Range Status   MRSA by PCR NEGATIVE NEGATIVE Final    Comment:        The GeneXpert MRSA Assay (FDA approved for NASAL specimens only), is one component of a comprehensive MRSA colonization surveillance program. It is not intended to diagnose MRSA infection nor to guide or monitor treatment for MRSA infections.   Clostridium Difficile by PCR     Status: None   Collection Time: 03/23/14  9:06 PM  Result Value Ref Range Status   C difficile by pcr NEGATIVE NEGATIVE Final    Medical History: Past Medical History  Diagnosis Date  . Hypertension   . BPH (benign prostatic hyperplasia)   . Hyperlipidemia   . Cancer 2008-2009    prostate TREATED WITH RADIATION  . AAA (abdominal aortic aneurysm) 04/2012    STENTING OF AAA IN [place]  . Junctional cardiac arrhythmia     Occurred postoperatively after urologic surgery  . Severe sinus bradycardia     Occurred postoperatively after urologic surgery  . Aortic valve disorders   . Other primary cardiomyopathies   . Dysphagia   . Status  post insertion of percutaneous endoscopic gastrostomy (PEG) tube 02/11/14  . Bacteremia due to Pseudomonas   . Infected aortic graft     Medications:  Prescriptions prior to admission  Medication Sig Dispense Refill Last Dose  . acetaminophen (TYLENOL) 500 MG tablet Take 1,000 mg by mouth every 6 (six) hours as needed for mild pain or moderate pain.   Past Week at Unknown time  . albuterol (PROVENTIL) (2.5 MG/3ML) 0.083% nebulizer solution Take 3 mLs (2.5 mg total) by nebulization every 2 (two) hours as needed for wheezing or shortness of breath. 75 mL 12 Past Week at Unknown time  . Amino Acids-Protein Hydrolys (FEEDING SUPPLEMENT, PRO-STAT SUGAR FREE 64,) LIQD Take 30 mLs by mouth 3 (three) times daily  with meals. 900 mL 0 unknown at unknown  . doxazosin (CARDURA) 4 MG tablet Take 4 mg by mouth at bedtime.   Past Week at Unknown time  . famotidine (PEPCID) 40 MG/5ML suspension Place 5 mLs (40 mg total) into feeding tube daily. 50 mL 0 Past Week at Unknown time  . lactulose (CHRONULAC) 10 GM/15ML solution Take 10 g by mouth daily. Every day per [facility]   Past Week at Unknown time  . levofloxacin (LEVAQUIN) 750 MG tablet Please resume oral levaquin when IV Meropenem is complete on 12/25.  Continue indefintely for suppression of infection.  This medication will be managed by Infectious Disease at [facility]. (Patient taking differently: Take 750 mg by mouth daily. Please resume oral levaquin when IV Meropenem is complete on 12/25.  Continue indefintely for suppression of infection.  This medication will be managed by Infectious Disease at [facility].)   Past Week at Unknown time  . LORazepam (ATIVAN) 2 MG/ML concentrated solution Take 0.1 mLs (0.2 mg total) by mouth every 6 (six) hours as needed for anxiety. 30 mL 0 Past Week at Unknown time  . methylphenidate (RITALIN) 5 MG tablet Take 1 tablet (5 mg total) by mouth daily. 30 tablet 0 Past Week at Unknown time  . metoprolol succinate (TOPROL-XL) 25 MG 24 hr tablet Take 25 mg by mouth daily.   Past Week at 0800  . mirtazapine (REMERON) 30 MG tablet Take 30 mg by mouth at bedtime.   Past Week at Unknown time  . Nutritional Supplements (FEEDING SUPPLEMENT, JEVITY 1.2 CAL,) LIQD Place 1,000 mLs into feeding tube continuous.  0 unknown at unknown  . rifampin (RIFADIN) 300 MG capsule Take 300 mg by mouth every 12 (twelve) hours.   Past Week at Unknown time  . simvastatin (ZOCOR) 20 MG tablet Take 20 mg by mouth daily.   Past Week at Unknown time  . tolterodine (DETROL LA) 4 MG 24 hr capsule Take 4 mg by mouth daily.   Past Week at Unknown time  . traMADol (ULTRAM) 50 MG tablet Take 1 tablet (50 mg total) by mouth every 6 (six) hours as needed for moderate pain. 60 tablet 0  Past Week at Unknown time  . triamcinolone cream (KENALOG) 0.1 % Apply 1 application topically 2 (two) times daily as needed (rash).   Past Week at Unknown time  . vancomycin (VANCOCIN) 50 mg/mL oral solution For C-Diff.  Continue indefinitely until patient is seen in follow up at [facility] Infectious Disease. (as the patient will be on continuous Levaquin). (Patient taking differently: Take 125 mg by mouth 4 (four) times daily. For C-Diff.  Continue indefinitely until patient is seen in follow up at [facility] Infectious Disease. (as the patient will be on continuous Levaquin).)  Past Week at Unknown time  . Water For Irrigation, Sterile (FREE WATER) SOLN Place 100 mLs into feeding tube every 8 (eight) hours.   03/23/2014 at Unknown time   Assessment: 81 yo male with fever, possible sepsis, h/o infected endovascular graft, for empiric antibiotics.  Vancomycin 1.5 g IV given in ED at 2100  Goal of Therapy:  Vancomycin trough level 15-20 mcg/ml  Plan:  Vancomycin 750 mg IV q24h Meropenem 1 g IV q12h  Caryl Pina 04/09/2014,11:41 PM

## 2014-04-09 NOTE — Assessment & Plan Note (Signed)
The etiology of this is unclear. Could have recurrence of bacteremia, uti, c diff, failure to thrive/EOL issues, ect.  He needs eval in acute care setting. Will send him to ED.

## 2014-04-09 NOTE — Assessment & Plan Note (Signed)
Would continue his levaquin and rifampin unless he has C diff. Will be eval in hospital.

## 2014-04-09 NOTE — Progress Notes (Signed)
   Subjective:    Patient ID: [name], male    DOB: [birth year], 81 y.o.   MRN: [id number]  HPI 81 year old male with a history of a thoraco-abdominal aortic aneurysm in July 6967 complicated by Pseudomonas (sensitive to ceftaz, Zosyn, gent, Levaquin) graft infection and subsequent pseudomonas (12-16-13, pan-sens). He was started on cefepime at that time and was discharged to skilled nursing facility with plan for ongoing IV antibiotics. He has been followed by [facility] infectious disease. His PICC line was removed 12-9 and his antibiotics were changed to by mouth Levaquin by [facility] ID.  He is sent to [facility] on 12-10 with a temp of 103.3 and hypotension In the emergency room he was started on vancomycin and cefepime. His CXR was no acute, his WBC is slightly low at 3.4.  His CT of his graft showed: Stable sizes of thoracoabdominal, distal aortic and RIGHT common iliac artery aneurysms post endoluminal stenting.  No evidence of endoleak or adjacent abscess collection.  Interval additional stenting of the celiac artery with previously identified pseudoaneurysm no longer opacified and smaller in size.  Dissection seen at the common hepatic artery origin on the previous study is not definitely visualized on the current study which demonstrates an interval stent.  Suspected blood or debris within urinary bladder, cannot exclude minimal anterior bladder wall thickening.  Colonic diverticulosis.  Patchy bibasilar atelectasis with mild LEFT upper lobe infiltrate suspicious for pneumonia.  Small amount of fluid adjacent to the gallbladder, cannot exclude cholecystitis; recommend clinical correlation and if indicated ultrasound imaging.   Electronically Signed   By: Lavonia Dana M.D.   On: 02/26/2014 13:38    His BCx grew proteus (R- bactrim, cipro, gent. I - tobra). He was treated with merrem/rifampin and then was supposed to f/u at [facility] for his anbx for his anbx for his graft. He and his family instead  chose to f/u at [facility]. By his MAR he is on levaquin/rifampin. By his d/c notes he completed his merrem on 03-07-14.  He had C diff+ 02-26-14 and has been on vanco 125mg  qid. He is currently at [facility].  He has been recieveing IVF at [facility].   In clinic today he is hypotensive, tachycardic. On conferring with pt and his daughter, they wish to be seen in hospital.   Review of Systems He has chronic loose BM due to feeding tube.  He was in hospital 2 weeks ago for "pneumonia" per daughter.  There is no record of f/c per daughter or sent with him.      Objective:   Physical Exam  Constitutional: He appears well-developed and well-nourished. No distress.  HENT:  Mouth/Throat:    Eyes: EOM are normal.  Neck: Neck supple.  Cardiovascular: Tachycardia present.   Pulmonary/Chest: Effort normal and breath sounds normal.  Abdominal: Soft. Bowel sounds are normal. There is no tenderness.    Musculoskeletal: He exhibits no edema.  Lymphadenopathy:    He has no cervical adenopathy.          Assessment & Plan:

## 2014-04-09 NOTE — ED Notes (Signed)
Hospitalist at bedside 

## 2014-04-09 NOTE — Progress Notes (Signed)
Patient ID: [name], male   DOB: 09-28-1929, 81 y.o.   MRN: [id number]    Nursing Home Location:  [place] of Service: SNF (25)  PCP: [name], MD  Allergies  Allergen Reactions  . Atorvastatin Other (See Comments)    Causes constipation  . Penicillins Other (See Comments)    Other reaction(s): RASH HIVES     Chief Complaint  Patient presents with  . Acute Visit    HPI:  Patient is a 81 y.o. male seen today at [facility] at the request of nursing. 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 Pt with episode of nausea and vomiting x1 last night, tube feeding was held. This morning he has been without any Nausea or vomiting and TF were restarted and he is tolerating well. Had a regular BM this morning. Also noted hypotension which started last night, metoprolol was held. Pt denies fevers or chill. No elevated temp on review of VS. No changes in urination or bowels. No shortness of breath, cough, congestion or chest pains. Pt reports he feels fine.  Review of Systems:  Review of Systems  Constitutional: Negative for activity change, fatigue and unexpected weight change.  HENT: Positive for hearing loss.   Respiratory: Negative for cough and shortness of breath.   Gastrointestinal: Negative for nausea, vomiting, abdominal pain, diarrhea and constipation.  Genitourinary: Negative for dysuria, frequency and difficulty urinating.  Musculoskeletal: Negative for myalgias and arthralgias.  Neurological: Negative for dizziness and headaches.  Psychiatric/Behavioral: Negative for behavioral problems and agitation.    Past Medical History  Diagnosis Date  . Hypertension   . BPH (benign prostatic hyperplasia)   . Hyperlipidemia   . Cancer 2008-2009    prostate TREATED WITH RADIATION  . AAA (abdominal aortic aneurysm) 04/2012    STENTING  OF AAA IN [place]  . Junctional cardiac arrhythmia     Occurred postoperatively after urologic surgery  . Severe sinus bradycardia     Occurred postoperatively after urologic surgery  . Aortic valve disorders   . Other primary cardiomyopathies   . Dysphagia   . Status post insertion of percutaneous endoscopic gastrostomy (PEG) tube 02/11/14  . Bacteremia due to Pseudomonas   . Infected aortic graft    Past Surgical History  Procedure Laterality Date  . Abdominal surgery      PART OF COLON REMOVED FOR DIVERTICULITIS  . Appendectomy    . Hernia repair    . Bladder surgery  2008    FOR BLADDER STONE  . Total knee arthroplasty  1990    left  . Abdominal aortic aneurysm repair  04/2012  . Total knee arthroplasty Right 07/26/2012    Procedure: RIGHT TOTAL KNEE ARTHROPLASTY;  Surgeon: Tobi Bastos, MD;  Location: [facility];  Service: Orthopedics;  Laterality: Right;  . Nephrolithotomy Left 04/26/2013    Procedure: LEFT PERCUTANEOUS NEPHROLITHOTOMY ;  Surgeon: [name], MD;  Location: [facility];  Service: Urology;  Laterality: Left;  . Nephrolithotomy Left 05/03/2013    Procedure: 2ND STAGE LEFT PERCUTANEOUS NEPHROLITHOTOMY ;  Surgeon: [name], MD;  Location: [facility];  Service: Urology;  Laterality: Left;  . Esophagogastroduodenoscopy N/A 02/11/2014    [id number] hiatal hernia. Patchy erythema of the gastric mucosa; otherwise negative EGD. Status post 74 French Microvasive PEG tube placement  . Peg placement N/A 02/11/2014  Procedure: PERCUTANEOUS ENDOSCOPIC GASTROSTOMY (PEG) PLACEMENT;  Surgeon: Daneil Dolin, MD;  [facility];  Service: Endoscopy;  Laterality: N/A;   Social History:   reports that he quit smoking about 29 years ago. His smoking use included Cigarettes. He started smoking about 65 years ago. He has a 74 pack-year smoking history. He has never used smokeless tobacco. He reports that he does not drink alcohol or use illicit drugs.  Family History  Problem  Relation Age of Onset  . Lung cancer Father     Medications: Patient's Medications  New Prescriptions   No medications on file  Previous Medications   ACETAMINOPHEN (TYLENOL) 500 MG TABLET    Take 1,000 mg by mouth every 6 (six) hours as needed for mild pain or moderate pain.   ALBUTEROL (PROVENTIL) (2.5 MG/3ML) 0.083% NEBULIZER SOLUTION    Take 3 mLs (2.5 mg total) by nebulization every 2 (two) hours as needed for wheezing or shortness of breath.   ALUM & MAG HYDROXIDE-SIMETH (MAGIC MOUTHWASH) SOLN    Take 5 mLs by mouth 4 (four) times daily. 10 day regimen started 03/22/14   AMINO ACIDS-PROTEIN HYDROLYS (FEEDING SUPPLEMENT, PRO-STAT SUGAR FREE 64,) LIQD    Take 30 mLs by mouth 3 (three) times daily with meals.   DOCUSATE SODIUM (COLACE) 100 MG CAPSULE    Take 100 mg by mouth daily as needed for mild constipation.   DOXAZOSIN (CARDURA) 4 MG TABLET    Take 4 mg by mouth at bedtime.   FAMOTIDINE (PEPCID) 40 MG/5ML SUSPENSION    Place 5 mLs (40 mg total) into feeding tube daily.   LEVOFLOXACIN (LEVAQUIN) 750 MG TABLET    Please resume oral levaquin when IV Meropenem is complete on 12/25.  Continue indefintely for suppression of infection.  This medication will be managed by Infectious Disease at [facility].   LORAZEPAM (ATIVAN) 2 MG/ML CONCENTRATED SOLUTION    Take 0.1 mLs (0.2 mg total) by mouth every 6 (six) hours as needed for anxiety.   METHYLPHENIDATE (RITALIN) 5 MG TABLET    Take 1 tablet (5 mg total) by mouth daily.   METOPROLOL SUCCINATE (TOPROL-XL) 25 MG 24 HR TABLET    Take 25 mg by mouth daily.   MIRTAZAPINE (REMERON) 30 MG TABLET    Take 30 mg by mouth at bedtime.   NUTRITIONAL SUPPLEMENTS (FEEDING SUPPLEMENT, JEVITY 1.2 CAL,) LIQD    Place 1,000 mLs into feeding tube continuous.   RIFAMPIN (RIFADIN) 300 MG CAPSULE    Take 300 mg by mouth every 12 (twelve) hours.   SIMVASTATIN (ZOCOR) 20 MG TABLET    Take 20 mg by mouth daily.   TRAMADOL (ULTRAM) 50 MG TABLET    Take 1 tablet (50 mg total)  by mouth every 6 (six) hours as needed for moderate pain.   TRIAMCINOLONE CREAM (KENALOG) 0.1 %    Apply 1 application topically 2 (two) times daily as needed (rash).   VANCOMYCIN (VANCOCIN) 50 MG/ML ORAL SOLUTION    For C-Diff.  Continue indefinitely until patient is seen in follow up at [facility]. (as the patient will be on continuous Levaquin).   WATER FOR IRRIGATION, STERILE (FREE WATER) SOLN    Place 100 mLs into feeding tube every 8 (eight) hours.  Modified Medications   No medications on file  Discontinued Medications   No medications on file     Physical Exam: Filed Vitals:   04/09/14 1507  BP: 80/51  Pulse: 104  Temp: 96.7  F (35.9 C)  Resp: 22  SpO2: 93%    Physical Exam  Constitutional: No distress.  HENT:  Mouth/Throat: Oropharynx is clear and moist. No oropharyngeal exudate.  Neck: Normal range of motion. Neck supple.  Cardiovascular: Normal rate, regular rhythm and normal heart sounds.   Pulmonary/Chest: Effort normal and breath sounds normal.  Abdominal: Soft. Bowel sounds are normal. He exhibits no distension. There is no tenderness. There is no rebound and no guarding.  Neurological: He is alert.  Skin: Skin is warm and dry. He is not diaphoretic.  Psychiatric: He has a normal mood and affect.    Labs reviewed: Basic Metabolic Panel:  Recent Labs  02/08/14 1146  02/10/14 0652 02/11/14 0603  02/23/14 0049  03/24/14 0319 03/25/14 0210 03/26/14 0550  NA  --   < >  --  146  < > 138  < > 140 138 134*  K  --   < >  --  3.7  < > 3.9  < > 3.6 3.4* 3.9  CL  --   < >  --  109  < > 107  < > 111 110 109  CO2  --   < >  --  27  < > 19  < > 25 24 20   GLUCOSE  --   < >  --  93  < > 107*  < > 101* 112* 113*  BUN  --   < >  --  26*  < > 31*  < > 20 16 16   CREATININE  --   < >  --  1.41*  < > 1.10  < > 1.17 0.94 0.81  CALCIUM  --   < >  --  9.5  < > 8.6  < > 8.0* 8.2* 8.1*  MG  --   --  1.6  --   --  1.8  --   --   --  2.0  PHOS 2.2*  --   --  2.2*   --  2.4  --   --   --   --   < > = values in this interval not displayed. Liver Function Tests:  Recent Labs  03/24/14 0319 03/25/14 0210 03/26/14 0550  AST 23 17 19   ALT 8 8 8   ALKPHOS 45 51 49  BILITOT 0.4 0.3 0.4  PROT 4.9* 4.9* 5.1*  ALBUMIN 1.9* 1.9* 1.9*    Recent Labs  02/06/14 1454  LIPASE 69*   No results for input(s): AMMONIA in the last 8760 hours. CBC:  Recent Labs  02/21/14 1127  02/28/14 0934 03/23/14 1455 03/24/14 0319 03/25/14 0210 03/26/14 0550  WBC 3.4*  < > 4.6 8.2 6.5 5.3 5.0  NEUTROABS 3.0  --  2.7 6.8  --   --   --   HGB 9.5*  < > 8.0* 9.2* 7.3* 7.9* 8.5*  HCT 29.5*  < > 24.7* 28.2* 22.8* 24.6* 26.4*  MCV 82.6  < > 82.9 82.5 86.7 84.2 82.8  PLT 103*  < > 103* 113* 99* 98* 86*  < > = values in this interval not displayed. TSH:  Recent Labs  02/06/14 1454  TSH 3.000   A1C: No results found for: HGBA1C Lipid Panel: No results for input(s): CHOL, HDL, LDLCALC, TRIG, CHOLHDL, LDLDIRECT in the last 8760 hours.    Assessment/Plan  1. Hypotension Will get cbc with diff, BMP stat to further evaluate -will have staff to hold metoprolol for BP <100 -due  to holding TF last night and hypotension staff to give NS 100 cc/hr for 500 cc    2. History of Bacteremia due to Pseudomonas conts on Levaquin daily, has follow up with ID today Will get CBC with diff today

## 2014-04-09 NOTE — ED Provider Notes (Signed)
CSN: [id number]     Arrival date & time 04/09/14  1602 History   First MD Initiated Contact with Patient 04/09/14 1726     Chief Complaint  Patient presents with  . Hypotension     (Consider location/radiation/quality/duration/timing/severity/associated sxs/prior Treatment) HPI Comments: Patient from nursing home with hypotension and tachycardia. One episode of vomiting last night. 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  Patient is a 81 y.o. male presenting with diarrhea. The history is provided by a caregiver.  Diarrhea Quality:  Watery Severity:  Moderate Onset quality:  Sudden Timing:  Constant Progression:  Worsening Relieved by:  Nothing Worsened by:  Nothing tried Ineffective treatments:  None tried Associated symptoms: no abdominal pain and no fever     Past Medical History  Diagnosis Date  . Hypertension   . BPH (benign prostatic hyperplasia)   . Hyperlipidemia   . Cancer 2008-2009    prostate TREATED WITH RADIATION  . AAA (abdominal aortic aneurysm) 04/2012    STENTING OF AAA IN [place]  . Junctional cardiac arrhythmia     Occurred postoperatively after urologic surgery  . Severe sinus bradycardia     Occurred postoperatively after urologic surgery  . Aortic valve disorders   . Other primary cardiomyopathies   . Dysphagia   . Status post insertion of percutaneous endoscopic gastrostomy (PEG) tube 02/11/14  . Bacteremia due to Pseudomonas   . Infected aortic graft    Past Surgical History  Procedure Laterality Date  . Abdominal surgery      PART OF COLON REMOVED FOR DIVERTICULITIS  . Appendectomy    . Hernia repair    . Bladder surgery  2008   FOR BLADDER STONE  . Total knee arthroplasty  1990    left  . Abdominal aortic aneurysm repair  04/2012  . Total knee arthroplasty Right 07/26/2012    Procedure: RIGHT TOTAL KNEE ARTHROPLASTY;  Surgeon: Tobi Bastos, MD;  Location: [facility];  Service: Orthopedics;  Laterality: Right;  . Nephrolithotomy Left 04/26/2013    Procedure: LEFT PERCUTANEOUS NEPHROLITHOTOMY ;  Surgeon: [name], MD;  Location: [facility];  Service: Urology;  Laterality: Left;  . Nephrolithotomy Left 05/03/2013    Procedure: 2ND STAGE LEFT PERCUTANEOUS NEPHROLITHOTOMY ;  Surgeon: [name], MD;  Location: [facility];  Service: Urology;  Laterality: Left;  . Esophagogastroduodenoscopy N/A 02/11/2014    [id number]:[id number] hiatal hernia. Patchy erythema of the gastric mucosa; otherwise negative EGD. Status post 16 French Microvasive PEG tube placement  . Peg placement N/A 02/11/2014    Procedure: PERCUTANEOUS ENDOSCOPIC GASTROSTOMY (PEG) PLACEMENT;  Surgeon: Daneil Dolin, MD;  [facility];  Service: Endoscopy;  Laterality: N/A;   Family History  Problem Relation Age of Onset  . Lung cancer Father    History  Substance Use Topics  . Smoking status: Former Smoker -- 2.00 packs/day for 37 years    Types: Cigarettes    Start date: 02/17/1949    Quit date: 03/15/1985  . Smokeless tobacco: Never Used     Comment: quit 25 years ago  . Alcohol Use: No    Review of Systems  Constitutional: Negative for fever.  Gastrointestinal: Positive for  diarrhea. Negative for abdominal pain.      Allergies  Atorvastatin and Penicillins  Home Medications   Prior to Admission medications   Medication Sig Start Date End Date Taking? Authorizing Provider  acetaminophen (TYLENOL) 500 MG tablet Take 1,000 mg by mouth every 6 (six) hours as needed for mild pain or moderate pain.    Historical Provider, MD  albuterol (PROVENTIL) (2.5 MG/3ML) 0.083% nebulizer solution Take 3 mLs (2.5 mg total) by nebulization every 2 (two) hours as  needed for wheezing or shortness of breath. 02/28/14   Melton Alar, PA-C  Alum & Mag Hydroxide-Simeth (MAGIC MOUTHWASH) SOLN Take 5 mLs by mouth 4 (four) times daily. 10 day regimen started 03/22/14    Historical Provider, MD  Amino Acids-Protein Hydrolys (FEEDING SUPPLEMENT, PRO-STAT SUGAR FREE 64,) LIQD Take 30 mLs by mouth 3 (three) times daily with meals. 03/27/14   Maryann Mikhail, DO  docusate sodium (COLACE) 100 MG capsule Take 100 mg by mouth daily as needed for mild constipation.    Historical Provider, MD  doxazosin (CARDURA) 4 MG tablet Take 4 mg by mouth at bedtime.    Historical Provider, MD  famotidine (PEPCID) 40 MG/5ML suspension Place 5 mLs (40 mg total) into feeding tube daily. 03/01/14   Melton Alar, PA-C  levofloxacin (LEVAQUIN) 750 MG tablet Please resume oral levaquin when IV Meropenem is complete on 12/25.  Continue indefintely for suppression of infection.  This medication will be managed by Infectious Disease at [facility]. 02/28/14   Melton Alar, PA-C  LORazepam (ATIVAN) 2 MG/ML concentrated solution Take 0.1 mLs (0.2 mg total) by mouth every 6 (six) hours as needed for anxiety. 02/28/14   Melton Alar, PA-C  methylphenidate (RITALIN) 5 MG tablet Take 1 tablet (5 mg total) by mouth daily. 04/08/14   Tiffany L Reed, DO  metoprolol succinate (TOPROL-XL) 25 MG 24 hr tablet Take 25 mg by mouth daily.    Historical Provider, MD  mirtazapine (REMERON) 30 MG tablet Take 30 mg by mouth at bedtime.    Historical Provider, MD  Nutritional Supplements (FEEDING SUPPLEMENT, JEVITY 1.2 CAL,) LIQD Place 1,000 mLs into feeding tube continuous. 02/13/14   Erline Hau, MD  rifampin (RIFADIN) 300 MG capsule Take 300 mg by mouth every 12 (twelve) hours.    Historical Provider, MD  simvastatin (ZOCOR) 20 MG tablet Take 20 mg by mouth daily.    Historical Provider, MD  traMADol (ULTRAM) 50 MG tablet Take 1 tablet (50 mg total) by mouth every 6 (six) hours as needed for moderate  pain. 02/28/14   Bobby Rumpf York, PA-C  triamcinolone cream (KENALOG) 0.1 % Apply 1 application topically 2 (two) times daily as needed (rash).    Historical Provider, MD  vancomycin (VANCOCIN) 50 mg/mL oral solution For C-Diff.  Continue indefinitely until patient is seen in follow up at [facility]. (as the patient will be on continuous Levaquin). 02/28/14   Melton Alar, PA-C  Water For Irrigation, Sterile (FREE WATER) SOLN Place 100 mLs into feeding tube every 8 (eight) hours. 02/28/14   Marianne L York, PA-C   BP 88/54 mmHg  Pulse 101  Temp(Src) 97.8 F (36.6 C) (Oral)  Resp 35  SpO2 98% Physical Exam  ED Course  Procedures (including critical care time) Labs Review Labs Reviewed  CBC WITH DIFFERENTIAL/PLATELET - Abnormal; Notable for the following:    WBC 12.2 (*)    RBC 3.99 (*)    Hemoglobin 10.9 (*)  HCT 35.2 (*)    RDW 18.6 (*)    Platelets 103 (*)    Neutrophils Relative % 80 (*)    Lymphocytes Relative 11 (*)    Neutro Abs 9.8 (*)    Monocytes Absolute 1.1 (*)    All other components within normal limits  BASIC METABOLIC PANEL - Abnormal; Notable for the following:    Glucose, Bld 128 (*)    BUN 43 (*)    Creatinine, Ser 2.00 (*)    GFR calc non Af Amer 29 (*)    GFR calc Af Amer 34 (*)    All other components within normal limits  URINALYSIS, ROUTINE W REFLEX MICROSCOPIC - Abnormal; Notable for the following:    Color, Urine ORANGE (*)    APPearance CLOUDY (*)    Bilirubin Urine SMALL (*)    Ketones, ur 15 (*)    Protein, ur 30 (*)    Nitrite POSITIVE (*)    Leukocytes, UA SMALL (*)    All other components within normal limits  BRAIN NATRIURETIC PEPTIDE - Abnormal; Notable for the following:    B Natriuretic Peptide 1434.7 (*)    All other components within normal limits  PROTIME-INR - Abnormal; Notable for the following:    Prothrombin Time 16.2 (*)    All other components within normal limits  URINE MICROSCOPIC-ADD ON - Abnormal;  Notable for the following:    Bacteria, UA FEW (*)    Casts HYALINE CASTS (*)    Crystals CA OXALATE CRYSTALS (*)    All other components within normal limits  TROPONIN I - Abnormal; Notable for the following:    Troponin I 0.90 (*)    All other components within normal limits  TROPONIN I - Abnormal; Notable for the following:    Troponin I 0.56 (*)    All other components within normal limits  COMPREHENSIVE METABOLIC PANEL - Abnormal; Notable for the following:    BUN 40 (*)    Creatinine, Ser 1.46 (*)    Total Protein 5.9 (*)    Albumin 2.3 (*)    GFR calc non Af Amer 43 (*)    GFR calc Af Amer 50 (*)    Anion gap <5 (*)    All other components within normal limits  CBC - Abnormal; Notable for the following:    RBC 3.31 (*)    Hemoglobin 9.1 (*)    HCT 29.2 (*)    RDW 18.7 (*)    Platelets 78 (*)    All other components within normal limits  PROTIME-INR - Abnormal; Notable for the following:    Prothrombin Time 15.4 (*)    All other components within normal limits  I-STAT CG4 LACTIC ACID, ED - Abnormal; Notable for the following:    Lactic Acid, Venous 4.21 (*)    All other components within normal limits  I-STAT CG4 LACTIC ACID, ED - Abnormal; Notable for the following:    Lactic Acid, Venous 3.09 (*)    All other components within normal limits  I-STAT TROPOININ, ED - Abnormal; Notable for the following:    Troponin i, poc 0.76 (*)    All other components within normal limits  CLOSTRIDIUM DIFFICILE BY PCR  STOOL CULTURE  MRSA PCR SCREENING  CULTURE, BLOOD (ROUTINE X 2)  CULTURE, BLOOD (ROUTINE X 2)  URINE CULTURE  TROPONIN I  POC OCCULT BLOOD, ED  I-STAT CG4 LACTIC ACID, ED    Imaging Review Dg Chest [facility] 1 View  04/09/2014   CLINICAL DATA:  Altered mental status  EXAM: PORTABLE CHEST - 1 VIEW  COMPARISON:  Portable exam 1801 hr compared to 03/24/2014  FINDINGS: Atherosclerotic calcification aorta with prior aortic stenting.  Stable heart size and mediastinal  contours.  Increased LEFT basilar atelectasis versus infiltrate.  Mild persistent opacity in RIGHT lower lobe little changed.  Upper lungs clear.  No gross pleural effusion or pneumothorax.  IMPRESSION: Persistent RIGHT basilar opacity with increased atelectasis versus infiltrate in LEFT lower lobe.   Electronically Signed   By: Lavonia Dana M.D.   On: 04/09/2014 18:16     EKG Interpretation   Date/Time:  Tuesday April 09 2014 20:52:35 EST Ventricular Rate:  90 PR Interval:    QRS Duration: 108 QT Interval:  381 QTC Calculation: 466 R Axis:   28 Text Interpretation:  Atrial flutter with predominant 3:1 AV block  Posterior infarct, old Nonspecific T abnormalities, lateral leads No  significant change since last tracing Confirmed by Zvi Duplantis  MD, Sulema Braid  ([id number]) on 04/09/2014 9:05:41 PM     CRITICAL CARE Performed by: Pamella Pert, S Total critical care time: 30 min Critical care time was exclusive of separately billable procedures and treating other patients. Critical care was necessary to treat or prevent imminent or life-threatening deterioration. Critical care was time spent personally by me on the following activities: development of treatment plan with patient and/or surrogate as well as nursing, discussions with consultants, evaluation of patient's response to treatment, examination of patient, obtaining history from patient or surrogate, ordering and performing treatments and interventions, ordering and review of laboratory studies, ordering and review of radiographic studies, pulse oximetry and re-evaluation of patient's condition.  MDM   Final diagnoses:  Sepsis, due to unspecified organism  Diarrhea  Lactic acidosis  Elevated troponin  Other specified hypotension  HCAP (healthcare-associated pneumonia)  UTI (lower urinary tract infection)  Renal insufficiency    6:06 PM 81 y.o. 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 Code sepsis called upon my eval. IVF, antibiotics, cultures. Patient had been on meropenem and rifampin previously. Ordered vancomycin and meropenem, this was discussed w/ on call ID physician. Patient has no complaints on exam except for rectal pain after having multiple episodes of nonbloody diarrhea today. He is afebrile and mild tachycardic here. Also hypotensive w/ lowest blood pressure of 75/54. Will cover for sepsis.   Pt will be admitted to stepdown.        Pamella Pert, MD 04/10/14 [phone]

## 2014-04-10 DIAGNOSIS — I4892 Unspecified atrial flutter: Secondary | ICD-10-CM

## 2014-04-10 DIAGNOSIS — R778 Other specified abnormalities of plasma proteins: Secondary | ICD-10-CM | POA: Diagnosis present

## 2014-04-10 DIAGNOSIS — R7989 Other specified abnormal findings of blood chemistry: Secondary | ICD-10-CM | POA: Diagnosis present

## 2014-04-10 LAB — COMPREHENSIVE METABOLIC PANEL
ALK PHOS: 48 U/L (ref 39–117)
ALT: 11 U/L (ref 0–53)
AST: 24 U/L (ref 0–37)
Albumin: 2.3 g/dL — ABNORMAL LOW (ref 3.5–5.2)
Anion gap: <5 — ABNORMAL LOW (ref 5–15)
BILIRUBIN TOTAL: 0.5 mg/dL (ref 0.3–1.2)
BUN: 40 mg/dL — ABNORMAL HIGH (ref 6–23)
CO2: 30 mmol/L (ref 19–32)
CREATININE: 1.46 mg/dL — AB (ref 0.50–1.35)
Calcium: 8.6 mg/dL (ref 8.4–10.5)
Chloride: 107 mmol/L (ref 96–112)
GFR calc Af Amer: 50 mL/min — ABNORMAL LOW (ref >90)
GFR, EST NON AFRICAN AMERICAN: 43 mL/min — AB (ref >90)
Glucose, Bld: 97 mg/dL (ref 70–99)
POTASSIUM: 4.4 mmol/L (ref 3.5–5.1)
Sodium: 138 mmol/L (ref 135–145)
Total Protein: 5.9 g/dL — ABNORMAL LOW (ref 6.0–8.3)

## 2014-04-10 LAB — PROTIME-INR
INR: 1.21 (ref 0.00–1.49)
Prothrombin Time: 15.4 seconds — ABNORMAL HIGH (ref 11.6–15.2)

## 2014-04-10 LAB — CBC
HCT: 29.2 % — ABNORMAL LOW (ref 39.0–52.0)
Hemoglobin: 9.1 g/dL — ABNORMAL LOW (ref 13.0–17.0)
MCH: 27.5 pg (ref 26.0–34.0)
MCHC: 31.2 g/dL (ref 30.0–36.0)
MCV: 88.2 fL (ref 78.0–100.0)
Platelets: 78 10*3/uL — ABNORMAL LOW (ref 150–400)
RBC: 3.31 MIL/uL — ABNORMAL LOW (ref 4.22–5.81)
RDW: 18.7 % — ABNORMAL HIGH (ref 11.5–15.5)
WBC: 5.8 10*3/uL (ref 4.0–10.5)

## 2014-04-10 LAB — TROPONIN I
TROPONIN I: 0.4 ng/mL — AB (ref <0.031)
Troponin I: 0.9 ng/mL (ref <0.031)
Troponin I: 0.56 ng/mL (ref <0.031)

## 2014-04-10 LAB — MRSA PCR SCREENING: MRSA by PCR: NEGATIVE

## 2014-04-10 LAB — CLOSTRIDIUM DIFFICILE BY PCR: Toxigenic C. Difficile by PCR: NEGATIVE

## 2014-04-10 MED ORDER — SODIUM CHLORIDE 0.9 % IV SOLN
1.0000 g | Freq: Three times a day (TID) | INTRAVENOUS | Status: DC
  Administered 2014-04-10 – 2014-04-14 (×12): 1 g via INTRAVENOUS
  Filled 2014-04-10 (×14): qty 1

## 2014-04-10 MED ORDER — SODIUM CHLORIDE 0.9 % IV SOLN
1250.0000 mg | INTRAVENOUS | Status: DC
  Administered 2014-04-11: 1250 mg via INTRAVENOUS
  Filled 2014-04-10 (×2): qty 1250

## 2014-04-10 MED ORDER — ASPIRIN 81 MG PO CHEW
324.0000 mg | CHEWABLE_TABLET | Freq: Every day | ORAL | Status: DC
  Administered 2014-04-10: 324 mg via ORAL
  Filled 2014-04-10: qty 4

## 2014-04-10 MED ORDER — CETYLPYRIDINIUM CHLORIDE 0.05 % MT LIQD
7.0000 mL | Freq: Two times a day (BID) | OROMUCOSAL | Status: DC
  Administered 2014-04-10 – 2014-04-14 (×8): 7 mL via OROMUCOSAL

## 2014-04-10 MED ORDER — CLOPIDOGREL BISULFATE 75 MG PO TABS
75.0000 mg | ORAL_TABLET | Freq: Every day | ORAL | Status: DC
  Administered 2014-04-10: 75 mg via JEJUNOSTOMY
  Filled 2014-04-10: qty 1

## 2014-04-10 MED ORDER — CHLORHEXIDINE GLUCONATE 0.12 % MT SOLN
15.0000 mL | Freq: Two times a day (BID) | OROMUCOSAL | Status: DC
  Administered 2014-04-10 – 2014-04-14 (×8): 15 mL via OROMUCOSAL
  Filled 2014-04-10 (×14): qty 15

## 2014-04-10 NOTE — Progress Notes (Signed)
*  PRELIMINARY RESULTS* Echocardiogram 2D Echocardiogram has been performed.  Leavy Cella 04/10/2014, 9:07 AM

## 2014-04-10 NOTE — Progress Notes (Signed)
INITIAL NUTRITION ASSESSMENT  DOCUMENTATION CODES Per approved criteria  -Not Applicable   INTERVENTION:  Resume TF via PEG when GI distress resolved: Jevity 1.2 at 40 ml/h, increase by 10 ml every 4 hours to goal rate of 70 ml/h with Prostat 30 ml once daily to provide 2116 kcals, 108 gm protein, 1361 ml free water daily.  NUTRITION DIAGNOSIS: Inadequate oral intake related to inability to eat as evidenced by NPO status.   Goal: Intake to meet >90% of estimated nutrition needs.  Monitor:  TF tolerance/adequacy, weight trend, labs.  Reason for Assessment: Malnutrition Screening Tool  81 y.o. male  Admitting Dx: Sepsis  ASSESSMENT: Patient presented to the hospital on 1/26 with hypotension and diarrhea. Noted patient was started on Lactulose on 1/21 for constipation.  He has a PEG for TF due to history of dysphagia. Per discussion with patient, he does not know his usual TF regimen or usual weight. He does report that he does not eat anything by mouth because he does not have any teeth. Per RD notes on previous admissions, patient received Jevity 1.2 formula via PEG.  Patient with recent 64 lb weight loss over 7 months from May to December of 2015. PEG was placed February 11, 2014. Weight has stabilized for the past month.  Height: Ht Readings from Last 1 Encounters:  04/09/14 6\' 2"  (1.88 m)    Weight: Wt Readings from Last 1 Encounters:  04/10/14 224 lb 13.9 oz (102 kg)    Ideal Body Weight: 86.4 kg  % Ideal Body Weight: 118%  Wt Readings from Last 10 Encounters:  04/10/14 224 lb 13.9 oz (102 kg)  03/26/14 223 lb 7 oz (101.35 kg)  03/01/14 216 lb 7.9 oz (98.2 kg)  02/13/14 219 lb 12.8 oz (99.7 kg)  01/11/14 240 lb (108.863 kg)  01/02/14 240 lb (108.863 kg)  10/31/13 256 lb (116.121 kg)  08/03/13 280 lb 12.8 oz (127.37 kg)  07/20/13 277 lb (125.646 kg)  06/29/13 277 lb (125.646 kg)    Usual Body Weight: 219 lbs (~2 months ago)  % Usual Body Weight:  102%  BMI:  Body mass index is 28.86 kg/(m^2).  Estimated Nutritional Needs: Kcal: 2000-2200 Protein: 100-120 gm Fluid: 2-2.2 L  Skin: MASD to scrotum  Diet Order: Diet NPO time specified Except for: Sips with Meds  EDUCATION NEEDS: -No education needs identified at this time   Intake/Output Summary (Last 24 hours) at 04/10/14 1413 Last data filed at 04/10/14 1159  Gross per 24 hour  Intake    700 ml  Output    475 ml  Net    225 ml    Last BM: 1/27 (rectal tube)  Labs:   Recent Labs Lab 04/09/14 1639 04/10/14 0536  NA 141 138  K 5.0 4.4  CL 102 107  CO2 25 30  BUN 43* 40*  CREATININE 2.00* 1.46*  CALCIUM 9.7 8.6  GLUCOSE 128* 97    CBG (last 3)  No results for input(s): GLUCAP in the last 72 hours.  Scheduled Meds: . antiseptic oral rinse  7 mL Mouth Rinse q12n4p  . aspirin  324 mg Oral Daily  . chlorhexidine  15 mL Mouth Rinse BID  . clopidogrel  75 mg Per J Tube Daily  . famotidine  40 mg Per Tube Daily  . free water  100 mL Per Tube 3 times per day  . meropenem (MERREM) IV  1 g Intravenous Q8H  . sodium chloride  3 mL Intravenous  Q12H  . [START ON 04/11/2014] vancomycin  1,250 mg Intravenous Q24H  . vancomycin  125 mg Oral QID    Continuous Infusions: . sodium chloride 100 mL/hr at 04/09/14 2315    Past Medical History  Diagnosis Date  . Hypertension   . BPH (benign prostatic hyperplasia)   . Hyperlipidemia   . Cancer 2008-2009    prostate TREATED WITH RADIATION  . AAA (abdominal aortic aneurysm) 04/2012    STENTING OF AAA IN [place]  . Junctional cardiac arrhythmia     Occurred postoperatively after urologic surgery  . Severe sinus bradycardia     Occurred postoperatively after urologic surgery  . Aortic valve disorders   . Other primary cardiomyopathies   . Dysphagia   . Status post insertion of percutaneous endoscopic gastrostomy (PEG) tube 02/11/14  . Bacteremia due to Pseudomonas   . Infected aortic graft     Past  Surgical History  Procedure Laterality Date  . Abdominal surgery      PART OF COLON REMOVED FOR DIVERTICULITIS  . Appendectomy    . Hernia repair    . Bladder surgery  2008    FOR BLADDER STONE  . Total knee arthroplasty  1990    left  . Abdominal aortic aneurysm repair  04/2012  . Total knee arthroplasty Right 07/26/2012    Procedure: RIGHT TOTAL KNEE ARTHROPLASTY;  Surgeon: Tobi Bastos, MD;  Location: [facility];  Service: Orthopedics;  Laterality: Right;  . Nephrolithotomy Left 04/26/2013    Procedure: LEFT PERCUTANEOUS NEPHROLITHOTOMY ;  Surgeon: [name], MD;  Location: [facility];  Service: Urology;  Laterality: Left;  . Nephrolithotomy Left 05/03/2013    Procedure: 2ND STAGE LEFT PERCUTANEOUS NEPHROLITHOTOMY ;  Surgeon: [name], MD;  Location: [facility];  Service: Urology;  Laterality: Left;  . Esophagogastroduodenoscopy N/A 02/11/2014    KFM:MCRFV hiatal hernia. Patchy erythema of the gastric mucosa; otherwise negative EGD. Status post 31 French Microvasive PEG tube placement  . Peg placement N/A 02/11/2014    Procedure: PERCUTANEOUS ENDOSCOPIC GASTROSTOMY (PEG) PLACEMENT;  Surgeon: Daneil Dolin, MD;  Location: [facility];  Service: Endoscopy;  Laterality: N/A;    Molli Barrows, RD, LDN, [place] Pager [phone] After Hours Pager [phone]

## 2014-04-10 NOTE — Progress Notes (Signed)
ANTIBIOTIC CONSULT NOTE - FOLLOW UP  Pharmacy Consult for Vancomycin and Meropenem Indication: rule out sepsis  Allergies  Allergen Reactions  . Atorvastatin Other (See Comments)    Causes constipation  . Penicillins Other (See Comments)    Other reaction(s): RASH HIVES     Patient Measurements: Height: 6\' 2"  (188 cm) Weight: 224 lb 13.9 oz (102 kg) IBW/kg (Calculated) : 82.2  Vital Signs: Temp: 97.8 F (36.6 C) (01/27 0808) Temp Source: Oral (01/27 0808) BP: 102/67 mmHg (01/27 0808) Pulse Rate: 87 (01/27 0404) Intake/Output from previous day: 01/26 0701 - 01/27 0700 In: 300 [I.V.:300] Out: 350 [Urine:350] Intake/Output from this shift: Total I/O In: 400 [I.V.:300; IV Piggyback:100] Out: -   Labs:  Recent Labs  04/09/14 1639 04/10/14 0536  WBC 12.2* 5.8  HGB 10.9* 9.1*  PLT 103* 78*  CREATININE 2.00* 1.46*   Estimated Creatinine Clearance: 49.7 mL/min (by C-G formula based on Cr of 1.46). No results for input(s): VANCOTROUGH, VANCOPEAK, VANCORANDOM, GENTTROUGH, GENTPEAK, GENTRANDOM, TOBRATROUGH, TOBRAPEAK, TOBRARND, AMIKACINPEAK, AMIKACINTROU, AMIKACIN in the last 72 hours.   Microbiology: Recent Results (from the past 720 hour(s))  Blood Culture (routine x 2)     Status: None   Collection Time: 03/23/14  2:41 PM  Result Value Ref Range Status   Specimen Description BLOOD RIGHT ARM  Final   Special Requests BOTTLES DRAWN AEROBIC AND ANAEROBIC 5CC  Final   Culture   Final    NO GROWTH 5 DAYS Performed at [facility]    Report Status 03/29/2014 FINAL  Final  Blood Culture (routine x 2)     Status: None   Collection Time: 03/23/14  2:59 PM  Result Value Ref Range Status   Specimen Description BLOOD LEFT ARM  Final   Special Requests BOTTLES DRAWN AEROBIC AND ANAEROBIC 5CC  Final   Culture   Final    NO GROWTH 5 DAYS Performed at [facility]    Report Status 03/29/2014 FINAL  Final  Urine culture     Status: None   Collection Time:  03/23/14  5:47 PM  Result Value Ref Range Status   Specimen Description URINE, CLEAN CATCH  Final   Special Requests NONE  Final   Colony Count NO GROWTH Performed at [facility]   Final   Culture NO GROWTH Performed at [facility]   Final   Report Status 03/25/2014 FINAL  Final  MRSA PCR Screening     Status: None   Collection Time: 03/23/14  7:16 PM  Result Value Ref Range Status   MRSA by PCR NEGATIVE NEGATIVE Final    Comment:        The GeneXpert MRSA Assay (FDA approved for NASAL specimens only), is one component of a comprehensive MRSA colonization surveillance program. It is not intended to diagnose MRSA infection nor to guide or monitor treatment for MRSA infections.   Clostridium Difficile by PCR     Status: None   Collection Time: 03/23/14  9:06 PM  Result Value Ref Range Status   C difficile by pcr NEGATIVE NEGATIVE Final  Clostridium Difficile by PCR     Status: None   Collection Time: 04/09/14  6:18 PM  Result Value Ref Range Status   C difficile by pcr NEGATIVE NEGATIVE Final  Stool culture     Status: None (Preliminary result)   Collection Time: 04/09/14  6:18 PM  Result Value Ref Range Status   Specimen Description STOOL  Final  Special Requests NONE  Final   Culture   Final    Culture reincubated for better growth Performed at [facility]    Report Status PENDING  Incomplete  MRSA PCR Screening     Status: None   Collection Time: 04/10/14  2:05 AM  Result Value Ref Range Status   MRSA by PCR NEGATIVE NEGATIVE Final    Comment:        The GeneXpert MRSA Assay (FDA approved for NASAL specimens only), is one component of a comprehensive MRSA colonization surveillance program. It is not intended to diagnose MRSA infection nor to guide or monitor treatment for MRSA infections.     Medical History: Past Medical History  Diagnosis Date  . Hypertension   . BPH (benign prostatic hyperplasia)   . Hyperlipidemia   .  Cancer 2008-2009    prostate TREATED WITH RADIATION  . AAA (abdominal aortic aneurysm) 04/2012    STENTING OF AAA IN [place]  . Junctional cardiac arrhythmia     Occurred postoperatively after urologic surgery  . Severe sinus bradycardia     Occurred postoperatively after urologic surgery  . Aortic valve disorders   . Other primary cardiomyopathies   . Dysphagia   . Status post insertion of percutaneous endoscopic gastrostomy (PEG) tube 02/11/14  . Bacteremia due to Pseudomonas   . Infected aortic graft     Medications:  Prescriptions prior to admission  Medication Sig Dispense Refill Last Dose  . acetaminophen (TYLENOL) 500 MG tablet Take 1,000 mg by mouth every 6 (six) hours as needed for mild pain or moderate pain.   Past Week at Unknown time  . albuterol (PROVENTIL) (2.5 MG/3ML) 0.083% nebulizer solution Take 3 mLs (2.5 mg total) by nebulization every 2 (two) hours as needed for wheezing or shortness of breath. 75 mL 12 Past Week at Unknown time  . Amino Acids-Protein Hydrolys (FEEDING SUPPLEMENT, PRO-STAT SUGAR FREE 64,) LIQD Take 30 mLs by mouth 3 (three) times daily with meals. 900 mL 0 unknown at unknown  . doxazosin (CARDURA) 4 MG tablet Take 4 mg by mouth at bedtime.   Past Week at Unknown time  . famotidine (PEPCID) 40 MG/5ML suspension Place 5 mLs (40 mg total) into feeding tube daily. 50 mL 0 Past Week at Unknown time  . lactulose (CHRONULAC) 10 GM/15ML solution Take 10 g by mouth daily. Every day per [facility]   Past Week at Unknown time  . levofloxacin (LEVAQUIN) 750 MG tablet Please resume oral levaquin when IV Meropenem is complete on 12/25.  Continue indefintely for suppression of infection.  This medication will be managed by Infectious Disease at [facility]. (Patient taking differently: Take 750 mg by mouth daily. Please resume oral levaquin when IV Meropenem is complete on 12/25.  Continue indefintely for suppression of infection.  This medication will be managed by Infectious  Disease at [facility].)   Past Week at Unknown time  . LORazepam (ATIVAN) 2 MG/ML concentrated solution Take 0.1 mLs (0.2 mg total) by mouth every 6 (six) hours as needed for anxiety. 30 mL 0 Past Week at Unknown time  . methylphenidate (RITALIN) 5 MG tablet Take 1 tablet (5 mg total) by mouth daily. 30 tablet 0 Past Week at Unknown time  . metoprolol succinate (TOPROL-XL) 25 MG 24 hr tablet Take 25 mg by mouth daily.   Past Week at 0800  . mirtazapine (REMERON) 30 MG tablet Take 30 mg by mouth at bedtime.   Past Week at Unknown time  .  Nutritional Supplements (FEEDING SUPPLEMENT, JEVITY 1.2 CAL,) LIQD Place 1,000 mLs into feeding tube continuous.  0 unknown at unknown  . rifampin (RIFADIN) 300 MG capsule Take 300 mg by mouth every 12 (twelve) hours.   Past Week at Unknown time  . simvastatin (ZOCOR) 20 MG tablet Take 20 mg by mouth daily.   Past Week at Unknown time  . tolterodine (DETROL LA) 4 MG 24 hr capsule Take 4 mg by mouth daily.   Past Week at Unknown time  . traMADol (ULTRAM) 50 MG tablet Take 1 tablet (50 mg total) by mouth every 6 (six) hours as needed for moderate pain. 60 tablet 0 Past Week at Unknown time  . triamcinolone cream (KENALOG) 0.1 % Apply 1 application topically 2 (two) times daily as needed (rash).   Past Week at Unknown time  . vancomycin (VANCOCIN) 50 mg/mL oral solution For C-Diff.  Continue indefinitely until patient is seen in follow up at [facility]. (as the patient will be on continuous Levaquin). (Patient taking differently: Take 125 mg by mouth 4 (four) times daily. For C-Diff.  Continue indefinitely until patient is seen in follow up at [facility] Infectious Disease. (as the patient will be on continuous Levaquin).)   Past Week at Unknown time  . Water For Irrigation, Sterile (FREE WATER) SOLN Place 100 mLs into feeding tube every 8 (eight) hours.   03/23/2014 at Unknown time   Assessment: 81 yo male with fever, possible sepsis, h/o infected endovascular graft, on  empiric antibiotics. Since admission, patient's LA has trended down to 1.9 and SCr has improved to 1.46. CrCl ~ 50 mL/min. ID consult pending.   Vanc 1/26>> Merrem 1/26>>  PO Vanc 1/27>>   1/26 Blood Cx x2>> 1/26 Urine Cx >>  1/26 Cdiff neg     Goal of Therapy:  Vancomycin trough level 15-20 mcg/ml  Plan:  -Increase Vancomycin to 1250 mg IV Q 24 hours -Increase Merrem to 1 gm IV Q 8 hours  -Consider d/c PO vanc if stool cx negative  -Monitor daily CBC, renal fx, cultures and clinical progress -VT at [facility]  -F/u ID consult   Albertina Parr, PharmD., BCPS Clinical Pharmacist Pager [phone]

## 2014-04-10 NOTE — Progress Notes (Addendum)
CRITICAL VALUE ALERT  Critical value received:  Troponin .90  Date of notification:  04/10/2014   Time of notification:  1:33 AM    Critical value read back:Yes.    Nurse who received alert:  Monico Hoar  MD notified (1st page):  Dr Posey Pronto  Time of first page:  0140    Responding MD:  Dr Posey Pronto  Time MD responded:  [phone]

## 2014-04-10 NOTE — Progress Notes (Addendum)
[facility] PROGRESS NOTE  [name]:[id number] DOB: Feb 14, 1930 DOA: 04/09/2014 PCP: [name], MD  brief narrative 81 y/o male resident of SNF,  with hx of prostate ca, AAA repair with graft infection and chronic endovascular leak causing anemia, hx of DVT with IVC filter, pseudomonas bacteremia, hx of Cdiff on oral vancomycin, dysphagia s/p PEG placement presented with several episodes of diarrhea.Patient is on multiple antibiotics chronically for his  infected Pseudomonas endovascular stent and also recurrent hospitalization with various infections. Along with diarrhea he was also noted to have low blood pressure and therefore he was referred to ER. Pt also c/o of chills, chronic cough and poor appetite. Pt found to be hypotensive and tachycardic with elevated lactic acid and pneumonia on CXR meeting criteria i for sepsis and admitted to stepdown.    Assessment/Plan: Sepsis Hypotension and tachycardia with elevated lactic acid meeting criteria for sepsis with underlying PNA. Possible HCAP on CXR. Placed on empiric vanco and meropenem. Follow blood cx, urine strep ag ad legionella ag. Stool for c diff negative. Lactate and BP improving.  continue IV hydration.  Elevated troponin  likely demand ischemia with sepsis. No chest pain or EKG changes except for sinus tachycardia. Serial troponins trending down. Check 2 D echo. continue full dose aspirin and plavix. Check 2 D echo. Admitting hospitalist d/w cardiology fellow and recommended to monitor serial enzymes.   Hx of recent c diff Repeat C diff PCR negative. Will d/c vancomycin ( ID recommended same during outpt visit on 1/26) Diarrhea improving  CKD stage 2 Renal fn at baseline. Avoid nephrotoxins.   Thrombocytopenia  chronic. Platelets worse from baseline.will not resume ASA or plavix at this time until plts more stable.   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 -Last admission he was seen by palliative care, family wishes to continue with care.  -Finished meropenem and rifampin March 07 2014  for recurrent bacteriemia, -on levaquin as outpt. Need to discuss with ID prior to transition from current abx for transition to oral abx and duration of same.   Hematuria in the setting of hx of prostate CA and radiation therapy -previously required continuous bladder irrigation per Urology . ASA & Plavix which were discontinued during last hospitalization. Will not restart at this time. -can be resumed if H&H and platelets are stable prior to discharge.    Chronic dysphagia and FTT with  Protein-calorie malnutrition, severe -with significant weight loss, on tube feedings . PT eval.  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 Repeat 2d echo continue metoprolol.   History of DVT of lower extremity (Feb 2015)  - status post IVC filter  Code Status: DNI  Family Communication: none at bedside Disposition Plan: transfer to telemetry.   Consultants:  none  Procedures:  2D echo  Antibiotics:  IV vanco and meropenem 1/26--  Oral vancomycin   HPI/Subjective: Pt seen and examined. Admission H&P reviewed. No diarrhea this morning. Denies abdominal pain. Was tachycardic early this am upto 130s, which improved subsequently.  Objective: Filed Vitals:   04/10/14 1244  BP: 108/70  Pulse:   Temp: 97.6 F (36.4 C)  Resp: 16    Intake/Output Summary (Last 24 hours) at 04/10/14 1410 Last data  filed at 04/10/14 1159  Gross per 24 hour  Intake    700 ml  Output    475 ml  Net    225 ml   Filed Weights   04/09/14 2300 04/10/14 0500  Weight: 102 kg (224 lb 13.9 oz) 102 kg (224  lb 13.9 oz)    Exam:   General:  Elderly male lying in bed in NAD, very hard of hearing  HEENT: no pallor, dry oral mucosa  Cardiovascular: PJ8&S5, systolic murmur 3/6  Respiratory: clear b/l, no added sounds  Abdomen: soft, NT, ND, BS+,PEG in place,  foley in place  Musculoskeletal: Warm, no edema  CNS: awake and alert, hard of hearing.  Data Reviewed: Basic Metabolic Panel:  Recent Labs Lab 04/09/14 1639 04/10/14 0536  NA 141 138  K 5.0 4.4  CL 102 107  CO2 25 30  GLUCOSE 128* 97  BUN 43* 40*  CREATININE 2.00* 1.46*  CALCIUM 9.7 8.6   Liver Function Tests:  Recent Labs Lab 04/10/14 0536  AST 24  ALT 11  ALKPHOS 48  BILITOT 0.5  PROT 5.9*  ALBUMIN 2.3*   No results for input(s): LIPASE, AMYLASE in the last 168 hours. No results for input(s): AMMONIA in the last 168 hours. CBC:  Recent Labs Lab 04/09/14 1639 04/10/14 0536  WBC 12.2* 5.8  NEUTROABS 9.8*  --   HGB 10.9* 9.1*  HCT 35.2* 29.2*  MCV 88.2 88.2  PLT 103* 78*   Cardiac Enzymes:  Recent Labs Lab 04/10/14 0005 04/10/14 0536 04/10/14 1000  TROPONINI 0.90* 0.56* 0.40*   BNP (last 3 results) No results for input(s): PROBNP in the last 8760 hours. CBG: No results for input(s): GLUCAP in the last 168 hours.  Recent Results (from the past 240 hour(s))  Clostridium Difficile by PCR     Status: None   Collection Time: 04/09/14  6:18 PM  Result Value Ref Range Status   C difficile by pcr NEGATIVE NEGATIVE Final  Stool culture     Status: None (Preliminary result)   Collection Time: 04/09/14  6:18 PM  Result Value Ref Range Status   Specimen Description STOOL  Final   Special Requests NONE  Final   Culture   Final    Culture reincubated for better growth Performed at [facility]    Report Status PENDING  Incomplete  MRSA PCR Screening     Status: None   Collection Time: 04/10/14  2:05 AM  Result Value Ref Range Status   MRSA by PCR NEGATIVE NEGATIVE Final     Comment:        The GeneXpert MRSA Assay (FDA approved for NASAL specimens only), is one component of a comprehensive MRSA colonization surveillance program. It is not intended to diagnose MRSA infection nor to guide or monitor treatment for MRSA infections.      Studies: Dg Chest Port 1 View  04/09/2014   CLINICAL DATA:  Altered mental status  EXAM: PORTABLE CHEST - 1 VIEW  COMPARISON:  Portable exam 1801 hr compared to 03/24/2014  FINDINGS: Atherosclerotic calcification aorta with prior aortic stenting.  Stable heart size and mediastinal contours.  Increased LEFT basilar atelectasis versus infiltrate.  Mild persistent opacity in RIGHT lower lobe little changed.  Upper lungs clear.  No gross pleural effusion or pneumothorax.  IMPRESSION: Persistent RIGHT basilar opacity with increased atelectasis versus infiltrate in LEFT lower lobe.   Electronically Signed   By: Lavonia Dana M.D.   On: 04/09/2014 18:16  Scheduled Meds: . antiseptic oral rinse  7 mL Mouth Rinse q12n4p  . aspirin  324 mg Oral Daily  . chlorhexidine  15 mL Mouth Rinse BID  . clopidogrel  75 mg Per J Tube Daily  . famotidine  40 mg Per Tube Daily  . free water  100 mL Per Tube 3 times per day  . meropenem (MERREM) IV  1 g Intravenous Q8H  . sodium chloride  3 mL Intravenous Q12H  . [START ON 04/11/2014] vancomycin  1,250 mg Intravenous Q24H  . vancomycin  125 mg Oral QID   Continuous Infusions: . sodium chloride 100 mL/hr at 04/09/14 2315      Time spent: 35 minutes    Zareya Tuckett  [facility] Pager [phone]. If 7PM-7AM, please contact night-coverage at [email or web address], password [facility] 04/10/2014, 2:10 PM  LOS: 1 day

## 2014-04-10 NOTE — Progress Notes (Signed)
MD made aware that pts resting heart rate is now sustained in the high 120s. Will cont to monitor.

## 2014-04-11 DIAGNOSIS — IMO0001 Reserved for inherently not codable concepts without codable children: Secondary | ICD-10-CM | POA: Insufficient documentation

## 2014-04-11 DIAGNOSIS — R8271 Bacteriuria: Secondary | ICD-10-CM | POA: Diagnosis present

## 2014-04-11 LAB — BASIC METABOLIC PANEL
Anion gap: 7 (ref 5–15)
BUN: 32 mg/dL — ABNORMAL HIGH (ref 6–23)
CO2: 23 mmol/L (ref 19–32)
CREATININE: 1.11 mg/dL (ref 0.50–1.35)
Calcium: 8.6 mg/dL (ref 8.4–10.5)
Chloride: 110 mmol/L (ref 96–112)
GFR calc non Af Amer: 60 mL/min — ABNORMAL LOW (ref >90)
GFR, EST AFRICAN AMERICAN: 69 mL/min — AB (ref >90)
GLUCOSE: 73 mg/dL (ref 70–99)
Potassium: 4 mmol/L (ref 3.5–5.1)
SODIUM: 140 mmol/L (ref 135–145)

## 2014-04-11 LAB — CBC
HEMATOCRIT: 28.5 % — AB (ref 39.0–52.0)
HEMOGLOBIN: 9.1 g/dL — AB (ref 13.0–17.0)
MCH: 27.6 pg (ref 26.0–34.0)
MCHC: 31.9 g/dL (ref 30.0–36.0)
MCV: 86.4 fL (ref 78.0–100.0)
PLATELETS: 74 10*3/uL — AB (ref 150–400)
RBC: 3.3 MIL/uL — ABNORMAL LOW (ref 4.22–5.81)
RDW: 18.6 % — ABNORMAL HIGH (ref 11.5–15.5)
WBC: 5.1 10*3/uL (ref 4.0–10.5)

## 2014-04-11 LAB — GLUCOSE, CAPILLARY
GLUCOSE-CAPILLARY: 85 mg/dL (ref 70–99)
Glucose-Capillary: 68 mg/dL — ABNORMAL LOW (ref 70–99)
Glucose-Capillary: 106 mg/dL — ABNORMAL HIGH (ref 70–99)
Glucose-Capillary: 80 mg/dL (ref 70–99)

## 2014-04-11 LAB — URINE CULTURE
COLONY COUNT: NO GROWTH
CULTURE: NO GROWTH

## 2014-04-11 MED ORDER — DEXTROSE 50 % IV SOLN
INTRAVENOUS | Status: AC
Start: 2014-04-11 — End: 2014-04-11
  Administered 2014-04-11: 25 mL
  Filled 2014-04-11: qty 50

## 2014-04-11 MED ORDER — PRO-STAT SUGAR FREE PO LIQD
30.0000 mL | Freq: Every day | ORAL | Status: DC
  Administered 2014-04-11 – 2014-04-14 (×4): 30 mL
  Filled 2014-04-11 (×5): qty 30

## 2014-04-11 MED ORDER — METOPROLOL SUCCINATE ER 25 MG PO TB24
25.0000 mg | ORAL_TABLET | Freq: Every day | ORAL | Status: DC
  Administered 2014-04-11 – 2014-04-12 (×2): 25 mg via ORAL
  Filled 2014-04-11 (×3): qty 1

## 2014-04-11 MED ORDER — JEVITY 1.2 CAL PO LIQD
1000.0000 mL | ORAL | Status: DC
  Administered 2014-04-11 – 2014-04-15 (×5): 1000 mL
  Filled 2014-04-11 (×12): qty 1000

## 2014-04-11 MED ORDER — VANCOMYCIN HCL 10 G IV SOLR
1250.0000 mg | Freq: Two times a day (BID) | INTRAVENOUS | Status: DC
  Administered 2014-04-11 – 2014-04-13 (×4): 1250 mg via INTRAVENOUS
  Filled 2014-04-11 (×5): qty 1250

## 2014-04-11 NOTE — Progress Notes (Signed)
ANTIBIOTIC CONSULT NOTE - FOLLOW UP  Pharmacy Consult for Vancomycin  Indication: rule out sepsis  Allergies  Allergen Reactions  . Atorvastatin Other (See Comments)    Causes constipation  . Penicillins Other (See Comments)    Other reaction(s): RASH HIVES     Patient Measurements: Height: 6\' 2"  (188 cm) Weight: 212 lb 15.4 oz (96.6 kg) IBW/kg (Calculated) : 82.2  Vital Signs: Temp: 98.2 F (36.8 C) (01/28 1324) Temp Source: Oral (01/28 1324) BP: 150/96 mmHg (01/28 1324) Pulse Rate: 93 (01/28 1324) Intake/Output from previous day: 01/27 0701 - 01/28 0700 In: 3050 [I.V.:2400; NG/GT:100; IV Piggyback:550] Out: 925 [Urine:925] Intake/Output from this shift: Total I/O In: 665.7 [I.V.:535.8; NG/GT:29.8; IV Piggyback:100] Out: 500 [Urine:500]  Labs:  Recent Labs  04/09/14 1639 04/10/14 0536 04/11/14 0415  WBC 12.2* 5.8 5.1  HGB 10.9* 9.1* 9.1*  PLT 103* 78* 74*  CREATININE 2.00* 1.46* 1.11   Estimated Creatinine Clearance: 59.7 mL/min (by C-G formula based on Cr of 1.11). No results for input(s): VANCOTROUGH, VANCOPEAK, VANCORANDOM, GENTTROUGH, GENTPEAK, GENTRANDOM, TOBRATROUGH, TOBRAPEAK, TOBRARND, AMIKACINPEAK, AMIKACINTROU, AMIKACIN in the last 72 hours.   Microbiology: Recent Results (from the past 720 hour(s))  Blood Culture (routine x 2)     Status: None   Collection Time: 03/23/14  2:41 PM  Result Value Ref Range Status   Specimen Description BLOOD RIGHT ARM  Final   Special Requests BOTTLES DRAWN AEROBIC AND ANAEROBIC 5CC  Final   Culture   Final    NO GROWTH 5 DAYS Performed at [facility]    Report Status 03/29/2014 FINAL  Final  Blood Culture (routine x 2)     Status: None   Collection Time: 03/23/14  2:59 PM  Result Value Ref Range Status   Specimen Description BLOOD LEFT ARM  Final   Special Requests BOTTLES DRAWN AEROBIC AND ANAEROBIC 5CC  Final   Culture   Final    NO GROWTH 5 DAYS Performed at [facility]    Report  Status 03/29/2014 FINAL  Final  Urine culture     Status: None   Collection Time: 03/23/14  5:47 PM  Result Value Ref Range Status   Specimen Description URINE, CLEAN CATCH  Final   Special Requests NONE  Final   Colony Count NO GROWTH Performed at [facility]   Final   Culture NO GROWTH Performed at [facility]   Final   Report Status 03/25/2014 FINAL  Final  MRSA PCR Screening     Status: None   Collection Time: 03/23/14  7:16 PM  Result Value Ref Range Status   MRSA by PCR NEGATIVE NEGATIVE Final    Comment:        The GeneXpert MRSA Assay (FDA approved for NASAL specimens only), is one component of a comprehensive MRSA colonization surveillance program. It is not intended to diagnose MRSA infection nor to guide or monitor treatment for MRSA infections.   Clostridium Difficile by PCR     Status: None   Collection Time: 03/23/14  9:06 PM  Result Value Ref Range Status   C difficile by pcr NEGATIVE NEGATIVE Final  Urine culture     Status: None   Collection Time: 04/09/14  6:18 PM  Result Value Ref Range Status   Specimen Description URINE, CLEAN CATCH  Final   Special Requests NONE  Final   Colony Count NO GROWTH Performed at [facility]   Final   Culture NO GROWTH Performed  at [facility]   Final   Report Status 04/11/2014 FINAL  Final  Clostridium Difficile by PCR     Status: None   Collection Time: 04/09/14  6:18 PM  Result Value Ref Range Status   C difficile by pcr NEGATIVE NEGATIVE Final  Stool culture     Status: None (Preliminary result)   Collection Time: 04/09/14  6:18 PM  Result Value Ref Range Status   Specimen Description STOOL  Final   Special Requests NONE  Final   Culture   Final    NO SUSPICIOUS COLONIES, CONTINUING TO HOLD Note: REDUCED NORMAL FLORA PRESENT Performed at [facility]    Report Status PENDING  Incomplete  Blood Culture (routine x 2)     Status: None (Preliminary result)    Collection Time: 04/09/14  7:18 PM  Result Value Ref Range Status   Specimen Description BLOOD ARM RIGHT  Final   Special Requests BOTTLES DRAWN AEROBIC AND ANAEROBIC 5CC  Final   Culture   Final           BLOOD CULTURE RECEIVED NO GROWTH TO DATE CULTURE WILL BE HELD FOR 5 DAYS BEFORE ISSUING A FINAL NEGATIVE REPORT Performed at [facility]    Report Status PENDING  Incomplete  Blood Culture (routine x 2)     Status: None (Preliminary result)   Collection Time: 04/09/14  8:23 PM  Result Value Ref Range Status   Specimen Description BLOOD ARM LEFT  Final   Special Requests BOTTLES DRAWN AEROBIC AND ANAEROBIC 5CC  Final   Culture   Final           BLOOD CULTURE RECEIVED NO GROWTH TO DATE CULTURE WILL BE HELD FOR 5 DAYS BEFORE ISSUING A FINAL NEGATIVE REPORT Performed at [facility]    Report Status PENDING  Incomplete  MRSA PCR Screening     Status: None   Collection Time: 04/10/14  2:05 AM  Result Value Ref Range Status   MRSA by PCR NEGATIVE NEGATIVE Final    Comment:        The GeneXpert MRSA Assay (FDA approved for NASAL specimens only), is one component of a comprehensive MRSA colonization surveillance program. It is not intended to diagnose MRSA infection nor to guide or monitor treatment for MRSA infections.     Medical History: Past Medical History  Diagnosis Date  . Hypertension   . BPH (benign prostatic hyperplasia)   . Hyperlipidemia   . Cancer 2008-2009    prostate TREATED WITH RADIATION  . AAA (abdominal aortic aneurysm) 04/2012    STENTING OF AAA IN [place]  . Junctional cardiac arrhythmia     Occurred postoperatively after urologic surgery  . Severe sinus bradycardia     Occurred postoperatively after urologic surgery  . Aortic valve disorders   . Other primary cardiomyopathies   . Dysphagia   . Status post insertion of percutaneous endoscopic gastrostomy (PEG) tube 02/11/14  . Bacteremia due to Pseudomonas   . Infected aortic  graft     Medications:  Prescriptions prior to admission  Medication Sig Dispense Refill Last Dose  . acetaminophen (TYLENOL) 500 MG tablet Take 1,000 mg by mouth every 6 (six) hours as needed for mild pain or moderate pain.   Past Week at Unknown time  . albuterol (PROVENTIL) (2.5 MG/3ML) 0.083% nebulizer solution Take 3 mLs (2.5 mg total) by nebulization every 2 (two) hours as needed for wheezing or shortness of breath. 75 mL  12 Past Week at Unknown time  . Amino Acids-Protein Hydrolys (FEEDING SUPPLEMENT, PRO-STAT SUGAR FREE 64,) LIQD Take 30 mLs by mouth 3 (three) times daily with meals. 900 mL 0 unknown at unknown  . doxazosin (CARDURA) 4 MG tablet Take 4 mg by mouth at bedtime.   Past Week at Unknown time  . famotidine (PEPCID) 40 MG/5ML suspension Place 5 mLs (40 mg total) into feeding tube daily. 50 mL 0 Past Week at Unknown time  . lactulose (CHRONULAC) 10 GM/15ML solution Take 10 g by mouth daily. Every day per [facility]   Past Week at Unknown time  . levofloxacin (LEVAQUIN) 750 MG tablet Please resume oral levaquin when IV Meropenem is complete on 12/25.  Continue indefintely for suppression of infection.  This medication will be managed by Infectious Disease at [facility]. (Patient taking differently: Take 750 mg by mouth daily. Please resume oral levaquin when IV Meropenem is complete on 12/25.  Continue indefintely for suppression of infection.  This medication will be managed by Infectious Disease at [facility].)   Past Week at Unknown time  . LORazepam (ATIVAN) 2 MG/ML concentrated solution Take 0.1 mLs (0.2 mg total) by mouth every 6 (six) hours as needed for anxiety. 30 mL 0 Past Week at Unknown time  . methylphenidate (RITALIN) 5 MG tablet Take 1 tablet (5 mg total) by mouth daily. 30 tablet 0 Past Week at Unknown time  . metoprolol succinate (TOPROL-XL) 25 MG 24 hr tablet Take 25 mg by mouth daily.   Past Week at 0800  . mirtazapine (REMERON) 30 MG tablet Take 30 mg by mouth at bedtime.   Past Week  at Unknown time  . Nutritional Supplements (FEEDING SUPPLEMENT, JEVITY 1.2 CAL,) LIQD Place 1,000 mLs into feeding tube continuous.  0 unknown at unknown  . rifampin (RIFADIN) 300 MG capsule Take 300 mg by mouth every 12 (twelve) hours.   Past Week at Unknown time  . simvastatin (ZOCOR) 20 MG tablet Take 20 mg by mouth daily.   Past Week at Unknown time  . tolterodine (DETROL LA) 4 MG 24 hr capsule Take 4 mg by mouth daily.   Past Week at Unknown time  . traMADol (ULTRAM) 50 MG tablet Take 1 tablet (50 mg total) by mouth every 6 (six) hours as needed for moderate pain. 60 tablet 0 Past Week at Unknown time  . triamcinolone cream (KENALOG) 0.1 % Apply 1 application topically 2 (two) times daily as needed (rash).   Past Week at Unknown time  . vancomycin (VANCOCIN) 50 mg/mL oral solution For C-Diff.  Continue indefinitely until patient is seen in follow up at [facility] Infectious Disease. (as the patient will be on continuous Levaquin). (Patient taking differently: Take 125 mg by mouth 4 (four) times daily. For C-Diff.  Continue indefinitely until patient is seen in follow up at [facility] Infectious Disease. (as the patient will be on continuous Levaquin).)   Past Week at Unknown time  . Water For Irrigation, Sterile (FREE WATER) SOLN Place 100 mLs into feeding tube every 8 (eight) hours.   03/23/2014 at Unknown time   Assessment: 81 yo male with fever, possible sepsis, h/o infected endovascular graft, on empiric antibiotics. Since admission, patient's LA has trended down to 1.9 and SCr has continued to improve to 1.11. CrCl ~ 60 mL/min. ID consult pending.   Vanc 1/26>> Merrem 1/26>>  PO Vanc 1/27>>   1/26 Blood Cx x2>>ngtd 1/26 Urine Cx >> neg 1/26 Cdiff neg  Goal of Therapy:  Vancomycin trough level 15-20 mcg/ml  Plan:  -Increase Vancomycin to 1250 mg IV Q 12 hours -Continue Merrem to 1 gm IV Q 8 hours  -Monitor daily CBC, renal fx, cultures and clinical progress -VT at [facility]  -F/u ID consult    Albertina Parr, PharmD., BCPS Clinical Pharmacist Pager [phone]

## 2014-04-11 NOTE — Evaluation (Signed)
Physical Therapy Evaluation Patient Details Name: [name] MRN: [id number] DOB: [birth year] Today's Date: 04/11/2014   History of Present Illness  81 y/o male resident of SNF,  with hx of prostate ca, AAA repair with graft infection and chronic endovascular leak causing anemia, hx of DVT with IVC filter, pseudomonas bacteremia, hx of Cdiff on oral vancomycin, dysphagia s/p PEG placement presented with several episodes of diarrhea.Patient is on multiple antibiotics chronically for his  infected Pseudomonas endovascular stent and also recurrent hospitalization with various infections.  Clinical Impression  Patient demonstrates deficits in functional mobility as indicated below. Will need continued skilled PT to address deficits and maximize function. Will see as indicated and progress as tolerated. VSS, BP125/74    Follow Up Recommendations SNF;Supervision/Assistance - 24 hour    Equipment Recommendations  None recommended by PT    Recommendations for Other Services       Precautions / Restrictions Precautions Precautions: Fall Restrictions Weight Bearing Restrictions: No      Mobility  Bed Mobility Overal bed mobility: Needs Assistance Bed Mobility: Supine to Sit;Rolling     Supine to sit: Min assist     General bed mobility comments: Min assist to come to EOB, increased time to perform  Transfers Overall transfer level: Needs assistance Equipment used: Rolling walker (2 wheeled) Transfers: Sit to/from Stand Sit to Stand: Min assist         General transfer comment: min assist to power up to standing, patient attempted to stand x3 without assist could not reach upright, once standing VCs for hand placement on RW  Ambulation/Gait Ambulation/Gait assistance: Min assist;Min guard Ambulation Distance (Feet): 60 Feet Assistive device: Rolling walker (2 wheeled) Gait Pattern/deviations: Step-through pattern;Decreased stride length;Trunk flexed;Narrow base of  support Gait velocity: decreased   General Gait Details: increased generalized weakness with fatigue during mobility, assist to maintain steadiness with RW during gait  Stairs            Wheelchair Mobility    Modified Rankin (Stroke Patients Only)       Balance                                             Pertinent Vitals/Pain Pain Assessment: No/denies pain    Home Living Family/patient expects to be discharged to:: Skilled nursing facility                      Prior Function Level of Independence: Needs assistance   Gait / Transfers Assistance Needed: Pt had been at SNF and reports at times can ambulate ~15 feet. At other times is at a transfers only level.  (per chart history)  ADL's / Homemaking Assistance Needed: Assist required for ADL's        Hand Dominance   Dominant Hand: Right    Extremity/Trunk Assessment               Lower Extremity Assessment: Generalized weakness      Cervical / Trunk Assessment: Kyphotic  Communication   Communication: HOH  Cognition Arousal/Alertness: Awake/alert Behavior During Therapy: WFL for tasks assessed/performed Overall Cognitive Status: No family/caregiver present to determine baseline cognitive functioning                      General Comments      Exercises  Assessment/Plan    PT Assessment Patient needs continued PT services  PT Diagnosis Generalized weakness;Difficulty walking   PT Problem List Decreased strength;Decreased range of motion;Decreased activity tolerance;Decreased balance;Decreased mobility;Decreased knowledge of use of DME;Decreased safety awareness;Decreased knowledge of precautions  PT Treatment Interventions DME instruction;Gait training;Functional mobility training;Stair training;Therapeutic activities;Therapeutic exercise;Neuromuscular re-education;Patient/family education   PT Goals (Current goals can be found in the Care Plan  section) Acute Rehab PT Goals Patient Stated Goal: None stated PT Goal Formulation: With patient Time For Goal Achievement: 04/25/14 Potential to Achieve Goals: Good    Frequency Min 2X/week   Barriers to discharge        Co-evaluation               End of Session Equipment Utilized During Treatment: Gait belt Activity Tolerance: Patient limited by fatigue Patient left: in chair;with call bell/phone within reach Nurse Communication: Mobility status;Need for lift equipment         Time: 1046-1110 PT Time Calculation (min) (ACUTE ONLY): 24 min   Charges:   PT Evaluation $Initial PT Evaluation Tier I: 1 Procedure PT Treatments $Gait Training: 8-22 mins   PT G CodesDuncan [name] 05/01/2014, 12:57 PM Alben Deeds, [place] DPT  [phone]

## 2014-04-11 NOTE — Progress Notes (Addendum)
NUTRITION FOLLOW UP / CONSULT  Intervention:    Initiate TF via PEG with Jevity 1.2 at 40 ml/h, increase by 10 ml every 4 hours to goal rate of 70 ml/h with Prostat 30 ml once daily to provide 2116 kcals, 108 gm protein, 1361 ml free water daily.  Nutrition Dx:   Inadequate oral intake related to inability to eat as evidenced by NPO status. Ongoing.  Goal:   Intake to meet >90% of estimated nutrition needs.  Monitor:   TF tolerance/adequacy, weight trend, labs.  Assessment:   Patient presented to the hospital on 1/26 with hypotension and diarrhea. Noted patient was started on Lactulose on 1/21 for constipation.  Received consult for TF initiation and management.   Height: Ht Readings from Last 1 Encounters:  04/09/14 6\' 2"  (1.88 m)    Weight Status:   Wt Readings from Last 1 Encounters:  04/11/14 212 lb 15.4 oz (96.6 kg)   04/10/14 224 lb 13.9 oz (102 kg)    Re-estimated needs:  Kcal: 2000-2200 Protein: 100-120 gm Fluid: 2-2.2 L  Skin: MASD to scrotum  Diet Order: Diet NPO time specified Except for: Sips with Meds   Intake/Output Summary (Last 24 hours) at 04/11/14 0922 Last data filed at 04/11/14 0847  Gross per 24 hour  Intake   2950 ml  Output   1175 ml  Net   1775 ml    Last BM: 1/27 (rectal tube)   Labs:   Recent Labs Lab 04/09/14 1639 04/10/14 0536 04/11/14 0415  NA 141 138 140  K 5.0 4.4 4.0  CL 102 107 110  CO2 25 30 23   BUN 43* 40* 32*  CREATININE 2.00* 1.46* 1.11  CALCIUM 9.7 8.6 8.6  GLUCOSE 128* 97 73    CBG (last 3)  No results for input(s): GLUCAP in the last 72 hours.  Scheduled Meds: . antiseptic oral rinse  7 mL Mouth Rinse q12n4p  . chlorhexidine  15 mL Mouth Rinse BID  . famotidine  40 mg Per Tube Daily  . free water  100 mL Per Tube 3 times per day  . meropenem (MERREM) IV  1 g Intravenous Q8H  . sodium chloride  3 mL Intravenous Q12H  . vancomycin  1,250 mg Intravenous Q24H    Continuous Infusions: . sodium  chloride 100 mL/hr at 04/11/14 0202  . feeding supplement (JEVITY 1.2 CAL)      Molli Barrows, RD, LDN, [place] Pager [phone] After Hours Pager [phone]

## 2014-04-11 NOTE — Progress Notes (Signed)
[facility] PROGRESS NOTE  [name] ZSW:[id number] DOB: [birth year] DOA: 04/09/2014 PCP: [name], MD  brief narrative 81 y/o male resident of SNF, with hx of prostate ca, AAA repair with graft infection and chronic endovascular leak causing anemia, hx of DVT with IVC filter, pseudomonas bacteremia, hx of Cdiff on oral vancomycin, dysphagia s/p PEG placement presented with several episodes of diarrhea.Patient is on multiple antibiotics chronically for his infected Pseudomonas endovascular stent and also recurrent hospitalization with various infections. Along with diarrhea he was also noted to have low blood pressure and therefore he was referred to ER. Pt also c/o of chills, chronic cough and poor appetite. Pt found to be hypotensive and tachycardic with elevated lactic acid and pneumonia on CXR meeting criteria i for sepsis and admitted to stepdown.      Assessment/Plan: #1 sepsis likely secondary to healthcare associated pneumonia Patient had presented with hypotension, tachycardia and elevated lactic acid level meeting criteria for sepsis with chest x-ray consistent with a underlying pneumonia. Likely a healthcare associated pneumonia. Patient with clinical improvement. Blood pressure has improved with antibiotics and hydration. Patient has been pancultured and cultures are pending. C. difficile is negative. Decrease IV fluids to 75 mL per hour. Continue gentle hydration. Continue empiric IV antibiotics.  #2 healthcare associated pneumonia Blood cultures are pending. Clinical improvement. Urine Legionella antigen pending urine pneumococcus antigen pending continue empiric IV vancomycin and IV meropenem. Follow.  #3 bacteriuria Urine cultures negative.  #4 elevated troponin Likely secondary to a demand ischemia. Repeat 2-D echo with EF of 35-40% with diffuse hypokinesis with no significant change from prior 2-D echo. Patient is asymptomatic. Troponins are trending  down. Admitting hospitalist discussed with cardiology fellow and recommended monitoring serial enzymes. Follow.  #5 history of recent C. difficile Repeat C. difficile PCR is negative. Oral vancomycin was discontinued. Diarrhea is improving.  #6 chronic kidney disease stage II Stable. Avoid nephrotoxins.  #7 thrombocytopenia Chronic in nature. Platelets stable. Follow. Aspirin and Plavix have been held.  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 -Last admission he was seen by palliative care, family wishes to continue with care.  -Finished meropenem and rifampin March 07 2014 for recurrent bacteriemia, -on levaquin as outpt. Need to discuss with ID prior to transition from current abx for transition to oral abx and duration of same  #9 hematuria in the setting of history of prostate cancer radiation therapy Patient previously on continuous bladder irrigation. Urology. Aspirin and Plavix were discontinued during last hospitalization. We'll not resume at this time. Follow H&H and platelets.  #10 chronic dysphagia/failure to thrive/severe protein calorie malnutrition Continue current tube feeds.  #11 anemia of chronic disease/anemia due to chronic blood loss H&H stable.  #12 chronic combined systolic EF of 32-20% per 2-D echo of 04/10/2014 and grade 1 diastolic CHF Currently compensated. 2-D echo with EF of 35-40% with diffuse hypokinesis with no significant change from prior 2-D echo. Continue current dose of metoprolol.  #13 history of lower extremity DVT (February 2015) status post IVC filter.  #14 prophylaxis SCDs for DVT prophylaxis.  Code Status: Partial: Family Communication: Updated patient no family at bedside. Disposition Plan: Remain inpatient.   Consultants:  None  Procedures:  Chest x-ray  04/09/2014  2-D echo 04/10/2014  Antibiotics:  IV meropenem 1/26/ 2016  IV vancomycin 04/09/2014  HPI/Subjective: Patient states she's feeling  better. No chest pain. No shortness of breath. No complaints.  Objective: Filed Vitals:   04/11/14 0700  BP: 131/75  Pulse: 100  Temp: 98.8 F (37.1 C)  Resp: 23    Intake/Output Summary (Last 24 hours) at 04/11/14 1031 Last data filed at 04/11/14 1000  Gross per 24 hour  Intake   3050 ml  Output   1175 ml  Net   1875 ml   Filed Weights   04/09/14 2300 04/10/14 0500 04/11/14 0500  Weight: 102 kg (224 lb 13.9 oz) 102 kg (224 lb 13.9 oz) 96.6 kg (212 lb 15.4 oz)    Exam:   General:  NAD  Cardiovascular: RRR  Respiratory: Basilar coarse BS  Abdomen: Soft, nontender, nondistended, positive bowel sounds. PEG tube site with no signs or symptoms of infection.  Musculoskeletal: No clubbing cyanosis or edema.  Data Reviewed: Basic Metabolic Panel:  Recent Labs Lab 04/09/14 1639 04/10/14 0536 04/11/14 0415  NA 141 138 140  K 5.0 4.4 4.0  CL 102 107 110  CO2 25 30 23   GLUCOSE 128* 97 73  BUN 43* 40* 32*  CREATININE 2.00* 1.46* 1.11  CALCIUM 9.7 8.6 8.6   Liver Function Tests:  Recent Labs Lab 04/10/14 0536  AST 24  ALT 11  ALKPHOS 48  BILITOT 0.5  PROT 5.9*  ALBUMIN 2.3*   No results for input(s): LIPASE, AMYLASE in the last 168 hours. No results for input(s): AMMONIA in the last 168 hours. CBC:  Recent Labs Lab 04/09/14 1639 04/10/14 0536 04/11/14 0415  WBC 12.2* 5.8 5.1  NEUTROABS 9.8*  --   --   HGB 10.9* 9.1* 9.1*  HCT 35.2* 29.2* 28.5*  MCV 88.2 88.2 86.4  PLT 103* 78* 74*   Cardiac Enzymes:  Recent Labs Lab 04/10/14 0005 04/10/14 0536 04/10/14 1000  TROPONINI 0.90* 0.56* 0.40*   BNP (last 3 results) No results for input(s): PROBNP in the last 8760 hours. CBG: No results for input(s): GLUCAP in the last 168 hours.  Recent Results (from the past 240 hour(s))  Urine culture      Status: None   Collection Time: 04/09/14  6:18 PM  Result Value Ref Range Status   Specimen Description URINE, CLEAN CATCH  Final   Special Requests NONE  Final   Colony Count NO GROWTH Performed at [facility]   Final   Culture NO GROWTH Performed at [facility]   Final   Report Status 04/11/2014 FINAL  Final  Clostridium Difficile by PCR     Status: None   Collection Time: 04/09/14  6:18 PM  Result Value Ref Range Status   C difficile by pcr NEGATIVE NEGATIVE Final  Stool culture     Status: None (Preliminary result)   Collection Time: 04/09/14  6:18 PM  Result Value Ref Range Status   Specimen Description STOOL  Final   Special Requests NONE  Final   Culture   Final    Culture reincubated for better growth Performed at [facility]    Report Status PENDING  Incomplete  Blood Culture (routine x 2)     Status: None (Preliminary result)   Collection Time: 04/09/14  7:18 PM  Result Value Ref Range Status   Specimen Description BLOOD ARM RIGHT  Final   Special Requests BOTTLES DRAWN AEROBIC AND ANAEROBIC 5CC  Final   Culture   Final           BLOOD CULTURE RECEIVED NO GROWTH TO  DATE CULTURE WILL BE HELD FOR 5 DAYS BEFORE ISSUING A FINAL NEGATIVE REPORT Performed at [facility]    Report Status PENDING  Incomplete  Blood Culture (routine x 2)     Status: None (Preliminary result)   Collection Time: 04/09/14  8:23 PM  Result Value Ref Range Status   Specimen Description BLOOD ARM LEFT  Final   Special Requests BOTTLES DRAWN AEROBIC AND ANAEROBIC 5CC  Final   Culture   Final           BLOOD CULTURE RECEIVED NO GROWTH TO DATE CULTURE WILL BE HELD FOR 5 DAYS BEFORE ISSUING A FINAL NEGATIVE REPORT Performed at [facility]    Report Status PENDING  Incomplete  MRSA PCR Screening     Status: None   Collection Time: 04/10/14  2:05 AM  Result Value Ref Range Status   MRSA by PCR NEGATIVE NEGATIVE Final    Comment:        The GeneXpert  MRSA Assay (FDA approved for NASAL specimens only), is one component of a comprehensive MRSA colonization surveillance program. It is not intended to diagnose MRSA infection nor to guide or monitor treatment for MRSA infections.      Studies: Dg Chest Port 1 View  04/09/2014   CLINICAL DATA:  Altered mental status  EXAM: PORTABLE CHEST - 1 VIEW  COMPARISON:  Portable exam 1801 hr compared to 03/24/2014  FINDINGS: Atherosclerotic calcification aorta with prior aortic stenting.  Stable heart size and mediastinal contours.  Increased LEFT basilar atelectasis versus infiltrate.  Mild persistent opacity in RIGHT lower lobe little changed.  Upper lungs clear.  No gross pleural effusion or pneumothorax.  IMPRESSION: Persistent RIGHT basilar opacity with increased atelectasis versus infiltrate in LEFT lower lobe.   Electronically Signed   By: Lavonia Dana M.D.   On: 04/09/2014 18:16    Scheduled Meds: . antiseptic oral rinse  7 mL Mouth Rinse q12n4p  . chlorhexidine  15 mL Mouth Rinse BID  . famotidine  40 mg Per Tube Daily  . feeding supplement (PRO-STAT SUGAR FREE 64)  30 mL Per Tube Q1200  . free water  100 mL Per Tube 3 times per day  . meropenem (MERREM) IV  1 g Intravenous Q8H  . sodium chloride  3 mL Intravenous Q12H  . vancomycin  1,250 mg Intravenous Q24H   Continuous Infusions: . sodium chloride 100 mL/hr at 04/11/14 0202  . feeding supplement (JEVITY 1.2 CAL)      Principal Problem:   Sepsis Active Problems:   History of DVT of lower extremity (Feb 2015) -post IVC filter   Aortic valve disorder   History of Bacteremia due to Pseudomonas   Dysphagia   Status post insertion of percutaneous endoscopic gastrostomy (PEG) tube   CKD (chronic kidney disease), stage III   HCAP (healthcare-associated pneumonia)   FTT (failure to thrive) in adult   Hypotension   Elevated troponin   Bacteria in urine    Time spent: 40 mins    [facility] MD [facility] Pager  [phone]. If 7PM-7AM, please contact night-coverage at [email or web address], password [facility] 04/11/2014, 10:31 AM  LOS: 2 days

## 2014-04-11 NOTE — Progress Notes (Signed)
Pharmacist Heart Failure Core Measure Documentation  Assessment: [name] has an EF documented as 35-40% on 04/10/14 by ECHO.  Rationale: Heart failure patients with left ventricular systolic dysfunction (LVSD) and an EF < 40% should be prescribed an angiotensin converting enzyme inhibitor (ACEI) or angiotensin receptor blocker (ARB) at discharge unless a contraindication is documented in the medical record.  This patient is not currently on an ACEI or ARB for HF.  This note is being placed in the record in order to provide documentation that a contraindication to the use of these agents is present for this encounter.  ACE Inhibitor or Angiotensin Receptor Blocker is contraindicated (specify all that apply)  []   ACEI allergy AND ARB allergy []   Angioedema []   Moderate or severe aortic stenosis []   Hyperkalemia []   Hypotension []   Renal artery stenosis [x]   Worsening renal function, preexisting renal disease or dysfunction   Albertina Parr, PharmD., BCPS Clinical Pharmacist Pager [phone]

## 2014-04-11 NOTE — Clinical Documentation Improvement (Signed)
Do you agree with A Flutter, AV block, and SVT as specified on the EKGs on 01/26 and 01/27? Please document your response in the health record or below.  Yes____________ No ____________ Other ___________ Clinically Undetermined __________    Supporting Information: Risk Factors: Sepsis, PNA, Severe Protein Cal malnutrition, CKD,  Signs & Symptoms: Diagnostics: Treatment Monitoring  Thank You, Heloise Beecham ,RN Clinical Documentation Specialist:  [place] Information Management

## 2014-04-11 NOTE — Clinical Documentation Improvement (Signed)
The impression from the ED note reveals pt with UTI  Clarification Needed  Do you agree pt with the ED report specifying UTI?  Please document your response in the health record or D/C summary.  Yes____________ No ____________ Other ___________ Clinically Undetermined __________    Supporting Information: Risk Factors: Sepsis, PNA, Severe Protein Cal malnutrition, CKD,  Signs & Symptoms: UTI (lower urinary tract infection) per ED note.  Diagnostics: Component      Color, Urine APPearance  Latest Ref Rng      YELLOW CLEAR  04/09/2014     6:18 PM ORANGE (A) CLOUDY (A)   Component      Nitrite Leukocytes, UA  Latest Ref Rng      NEGATIVE NEGATIVE  04/09/2014     6:18 PM POSITIVE (A) SMALL (A)   Component      Bacteria, UA Casts Crystals  Latest Ref Rng      RARE NEGATIVE NEGATIVE  04/09/2014     6:18 PM FEW (A) HYALINE CASTS (A) CA OXALATE CRYSTALS (A)   Treatment  meropenem (MERREM) 1 g in sodium chloride 0.9 % 100 mL IVPB  vancomycin (VANCOCIN) 50 mg/mL oral solution 125 mg    Thank You, Heloise Beecham ,RN Clinical Documentation Specialist:  [place] Information Management

## 2014-04-11 NOTE — Clinical Documentation Improvement (Signed)
    A cause and effect relationship may not be assumed and must be documented by a provider. 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 Due to or associated with each other ? Other (please specify) ___________________ ? Unrelated to each other ? Unable to determine ? Unknown    Risk Factors: Sepsis, PNA, Severe Protein Cal malnutrition, CKD,  Sign & Symptoms: Diagnostics Component      WBC  Latest Ref Rng      4.0 - 10.5 K/uL  04/09/2014     4:39 PM 12.2 (H)   Component      WBC Morphology  Latest Ref Rng        04/09/2014     4:39 PM MILD LEFT SHIFT (1-5% METAS, OCC MYELO, OCC BANDS)   Component      Lactic Acid, Venous  Latest Ref Rng      0.5 - 2.0 mmol/L  04/09/2014     5:01 PM 4.21 (HH)   Treatment: meropenem (MERREM) 1 g in sodium chloride 0.9 % 100 mL IVPB vancomycin (VANCOCIN) 50 mg/mL oral solution 125 mg     Thank you, Heloise Beecham ,RN Clinical Documentation Specialist:  [place] Information Management

## 2014-04-12 DIAGNOSIS — N390 Urinary tract infection, site not specified: Secondary | ICD-10-CM

## 2014-04-12 LAB — BASIC METABOLIC PANEL
Anion gap: 3 — ABNORMAL LOW (ref 5–15)
BUN: 27 mg/dL — AB (ref 6–23)
CO2: 28 mmol/L (ref 19–32)
Calcium: 8.7 mg/dL (ref 8.4–10.5)
Chloride: 112 mmol/L (ref 96–112)
Creatinine, Ser: 0.94 mg/dL (ref 0.50–1.35)
GFR calc Af Amer: 88 mL/min — ABNORMAL LOW (ref >90)
GFR, EST NON AFRICAN AMERICAN: 76 mL/min — AB (ref >90)
Glucose, Bld: 119 mg/dL — ABNORMAL HIGH (ref 70–99)
Potassium: 4 mmol/L (ref 3.5–5.1)
SODIUM: 143 mmol/L (ref 135–145)

## 2014-04-12 LAB — GLUCOSE, CAPILLARY
GLUCOSE-CAPILLARY: 106 mg/dL — AB (ref 70–99)
Glucose-Capillary: 112 mg/dL — ABNORMAL HIGH (ref 70–99)
Glucose-Capillary: 90 mg/dL (ref 70–99)
Glucose-Capillary: 93 mg/dL (ref 70–99)
Glucose-Capillary: 95 mg/dL (ref 70–99)
Glucose-Capillary: 96 mg/dL (ref 70–99)

## 2014-04-12 LAB — CBC
HCT: 30.6 % — ABNORMAL LOW (ref 39.0–52.0)
Hemoglobin: 9.6 g/dL — ABNORMAL LOW (ref 13.0–17.0)
MCH: 27.4 pg (ref 26.0–34.0)
MCHC: 31.4 g/dL (ref 30.0–36.0)
MCV: 87.4 fL (ref 78.0–100.0)
PLATELETS: 92 10*3/uL — AB (ref 150–400)
RBC: 3.5 MIL/uL — ABNORMAL LOW (ref 4.22–5.81)
RDW: 18.2 % — AB (ref 11.5–15.5)
WBC: 4 10*3/uL (ref 4.0–10.5)

## 2014-04-12 LAB — LACTIC ACID, PLASMA: Lactic Acid, Venous: 1.3 mmol/L (ref 0.5–2.0)

## 2014-04-12 MED ORDER — FESOTERODINE FUMARATE ER 8 MG PO TB24
8.0000 mg | ORAL_TABLET | Freq: Every day | ORAL | Status: DC
  Administered 2014-04-12 – 2014-04-15 (×4): 8 mg via ORAL
  Filled 2014-04-12 (×5): qty 1

## 2014-04-12 MED ORDER — LORAZEPAM 0.5 MG PO TABS
0.2500 mg | ORAL_TABLET | Freq: Four times a day (QID) | ORAL | Status: DC | PRN
Start: 2014-04-12 — End: 2014-04-15

## 2014-04-12 MED ORDER — DOXAZOSIN MESYLATE 4 MG PO TABS
4.0000 mg | ORAL_TABLET | Freq: Every day | ORAL | Status: DC
  Administered 2014-04-12 – 2014-04-14 (×3): 4 mg via ORAL
  Filled 2014-04-12 (×7): qty 1

## 2014-04-12 MED ORDER — TRAMADOL HCL 50 MG PO TABS: 50.0000 mg | ORAL_TABLET | Freq: Four times a day (QID) | ORAL | Status: DC | PRN

## 2014-04-12 MED ORDER — SIMVASTATIN 20 MG PO TABS
20.0000 mg | ORAL_TABLET | Freq: Every day | ORAL | Status: DC
  Administered 2014-04-12 – 2014-04-15 (×4): 20 mg via ORAL
  Filled 2014-04-12 (×5): qty 1

## 2014-04-12 NOTE — Evaluation (Signed)
Occupational Therapy Evaluation Patient Details Name: [name] MRN: [id number] DOB: 20-Feb-1930 Today's Date: 04/12/2014    History of Present Illness 81 y/o male resident of SNF,  with hx of prostate ca, AAA repair with graft infection and chronic endovascular leak causing anemia, hx of DVT with IVC filter, pseudomonas bacteremia, hx of Cdiff on oral vancomycin, dysphagia s/p PEG placement presented with several episodes of diarrhea.Patient is on multiple antibiotics chronically for his  infected Pseudomonas endovascular stent and also recurrent hospitalization with various infections.   Clinical Impression   Pt was assisted for ADL and reports he was largely in the bed at the SNF.  Presents with generalized weakness and balance impairment interfering with ability to perform ADL and ADL transfers.  No family available to offer information about pt's cognition.  Pt reports his wife is unable to assist him at home.  Plan is for return to SNF, although pt prefers to go home. Will defer further OT to SNF.    Follow Up Recommendations  SNF    Equipment Recommendations  None recommended by OT    Recommendations for Other Services       Precautions / Restrictions Precautions Precautions: Fall Precaution Comments: feeding tube, bowel incontinence Restrictions Weight Bearing Restrictions: No      Mobility Bed Mobility Overal bed mobility: Needs Assistance Bed Mobility: Rolling;Sidelying to Sit Rolling: Supervision Sidelying to sit: Min guard       General bed mobility comments: no physical assist, required extra time  Transfers Overall transfer level: Needs assistance Equipment used: Rolling walker (2 wheeled) Transfers: Sit to/from [facility] Sit to Stand: Min assist Stand pivot transfers: Min assist       General transfer comment: min assist to rise from bed to standing, good hand placement    Balance                                             ADL Overall ADL's : Needs assistance/impaired Eating/Feeding: NPO   Grooming: Wash/dry hands;Wash/dry face;Brushing hair;Set up;Sitting   Upper Body Bathing: Minimal assitance;Sitting   Lower Body Bathing: Total assistance;Sit to/from stand   Upper Body Dressing : Minimal assistance;Sitting   Lower Body Dressing: Total assistance;Sit to/from stand   Toilet Transfer: Minimal assistance;Stand-pivot;BSC   Toileting- Clothing Manipulation and Hygiene: Total assistance;Sit to/from stand         General ADL Comments: Pt incontinent of bowel and aware of need for clean up, used urinal at EOB.     Vision                     Perception     Praxis      Pertinent Vitals/Pain Pain Assessment: No/denies pain     Hand Dominance Right   Extremity/Trunk Assessment Upper Extremity Assessment Upper Extremity Assessment: Generalized weakness   Lower Extremity Assessment Lower Extremity Assessment: Defer to PT evaluation   Cervical / Trunk Assessment Cervical / Trunk Assessment: Kyphotic   Communication Communication Communication: HOH   Cognition Arousal/Alertness: Awake/alert Behavior During Therapy: WFL for tasks assessed/performed Overall Cognitive Status: No family/caregiver present to determine baseline cognitive functioning                     General Comments       Exercises       Shoulder Instructions  Home Living Family/patient expects to be discharged to:: Skilled nursing facility                                 Additional Comments: Pt wants to go home not to SNF.      Prior Functioning/Environment Level of Independence: Needs assistance  Gait / Transfers Assistance Needed: Pt had been at SNF and reports at times can ambulate ~15 feet. At other times is at a transfers only level.  ADL's / Homemaking Assistance Needed: Assist required for ADL's Communication / Swallowing Assistance Needed: wears  hearing aids, dependent on tube feedings Comments: wife is unable to assist pt    OT Diagnosis: Generalized weakness   OT Problem List: Decreased strength;Decreased activity tolerance;Decreased safety awareness;Decreased knowledge of use of DME or AE   OT Treatment/Interventions:      OT Goals(Current goals can be found in the care plan section) Acute Rehab OT Goals Patient Stated Goal: None stated  OT Frequency:     Barriers to D/C: Decreased caregiver support          Co-evaluation              End of Session Equipment Utilized During Treatment: Gait belt;Rolling walker Nurse Communication: Mobility status (aware of pulse ox being off)  Activity Tolerance: Patient tolerated treatment well Patient left: in chair;with call bell/phone within reach;with nursing/sitter in room   Time: 7654-6503 OT Time Calculation (min): 34 min Charges:  OT General Charges $OT Visit: 1 Procedure OT Evaluation $Initial OT Evaluation Tier I: 1 Procedure OT Treatments $Self Care/Home Management : 8-22 mins G-Codes:    Malka So 04/12/2014, 9:23 AM  [phone]

## 2014-04-12 NOTE — Progress Notes (Signed)
Pt transported to [facility] room 32 per bed with all belongings. Family aware of transfer. RN aware that pt is in the room. Pt given Nurse call light to use if anything needed.

## 2014-04-12 NOTE — Progress Notes (Signed)
[facility] PROGRESS NOTE  YUVAN MEDINGER ZJQ:[id number] DOB: 06/08/1929 DOA: 04/09/2014 PCP: [name], MD  brief narrative 81 y/o male resident of SNF, with hx of prostate ca, AAA repair with graft infection and chronic endovascular leak causing anemia, hx of DVT with IVC filter, pseudomonas bacteremia, hx of Cdiff on oral vancomycin, dysphagia s/p PEG placement presented with several episodes of diarrhea.Patient is on multiple antibiotics chronically for his infected Pseudomonas endovascular stent and also recurrent hospitalization with various infections. Along with diarrhea he was also noted to have low blood pressure and therefore he was referred to ER. Pt also c/o of chills, chronic cough and poor appetite. Pt found to be hypotensive and tachycardic with elevated lactic acid and pneumonia on CXR meeting criteria i for sepsis and admitted to stepdown.      Assessment/Plan: #1 sepsis likely secondary to healthcare associated pneumonia Patient had presented with hypotension, tachycardia and elevated lactic acid level meeting criteria for sepsis with chest x-ray consistent with a underlying pneumonia. Likely a healthcare associated pneumonia. Patient with clinical improvement. Blood pressure has improved with antibiotics and hydration. Patient has been pancultured and cultures are pending. C. difficile is negative. NSL IVF. Continue empiric IV antibiotics.  #2 healthcare associated pneumonia Blood cultures are pending. Clinical improvement. Urine Legionella antigen pending urine pneumococcus antigen pending continue empiric IV vancomycin and IV meropenem. If blood cultures remain negative tomorrow will d/c vancomycin. Follow.  #3 bacteriuria Urine cultures negative.  #4 elevated troponin Likely secondary to a demand ischemia. Repeat 2-D echo with EF of 35-40% with diffuse hypokinesis with no significant change from prior 2-D echo. Patient is asymptomatic. Troponins are  trending down. Admitting hospitalist discussed with cardiology fellow and recommended monitoring serial enzymes. Follow.  #5 Tachycardia vs SVT Secondary to #1. Resolved. Continue home dose beta blocker.  #6 history of recent C. difficile Repeat C. difficile PCR is negative. Oral vancomycin was discontinued. Diarrhea improving.  #7 chronic kidney disease stage II Stable. Avoid nephrotoxins.  #8 thrombocytopenia Chronic in nature. Platelets stable. Follow. Aspirin and Plavix have been held.  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 -Last admission he was seen by palliative care, family wishes to continue with care.  -Finished meropenem and rifampin March 07 2014 for recurrent bacteriemia, -on levaquin as outpt. Need to discuss with ID prior to transition from current abx for transition to oral abx and duration of same  #10 hematuria in the setting of history of prostate cancer radiation therapy Patient previously on continuous bladder irrigation. Urology. Aspirin and Plavix were discontinued during last hospitalization. We'll not resume at this time. Follow H&H and platelets.  #11 chronic dysphagia/failure to thrive/severe protein calorie malnutrition Continue current tube feeds.  #12 anemia of chronic disease/anemia due to chronic blood loss H&H stable.  #13 chronic combined systolic EF of 24-09% per 2-D echo of 04/10/2014 and grade 1 diastolic CHF Currently compensated. 2-D echo with EF of 35-40% with diffuse hypokinesis with no significant change from prior 2-D echo. Continue current dose of metoprolol.  #14 history of lower extremity DVT (February 2015) status post IVC filter.  #15 prophylaxis SCDs for DVT prophylaxis.  Code Status: Partial: Family Communication: Updated patient no family at  bedside. Disposition Plan: Transfer to telemetry   Consultants:  None  Procedures:  Chest x-ray 04/09/2014  2-D echo 04/10/2014  Antibiotics:  IV meropenem 1/26/ 2016  IV vancomycin 04/09/2014  HPI/Subjective: Patient states he's feeling better. No chest pain. No shortness of breath. No complaints.  Objective: Filed Vitals:   04/12/14 0914  BP: 145/94  Pulse: 80  Temp: 97.6 F (36.4 C)  Resp: 23    Intake/Output Summary (Last 24 hours) at 04/12/14 1015 Last data filed at 04/12/14 1005  Gross per 24 hour  Intake 3814.91 ml  Output    700 ml  Net 3114.91 ml   Filed Weights   04/11/14 0500 04/12/14 0344 04/12/14 0500  Weight: 96.6 kg (212 lb 15.4 oz) 95.9 kg (211 lb 6.7 oz) 95.8 kg (211 lb 3.2 oz)    Exam:   General:  NAD  Cardiovascular: RRR  Respiratory: CTAB  Abdomen: Soft, nontender, nondistended, positive bowel sounds. PEG tube site with no signs or symptoms of infection.  Musculoskeletal: No clubbing cyanosis or edema.  Data Reviewed: Basic Metabolic Panel:  Recent Labs Lab 04/09/14 1639 04/10/14 0536 04/11/14 0415 04/12/14 0430  NA 141 138 140 143  K 5.0 4.4 4.0 4.0  CL 102 107 110 112  CO2 25 30 23 28   GLUCOSE 128* 97 73 119*  BUN 43* 40* 32* 27*  CREATININE 2.00* 1.46* 1.11 0.94  CALCIUM 9.7 8.6 8.6 8.7   Liver Function Tests:  Recent Labs Lab 04/10/14 0536  AST 24  ALT 11  ALKPHOS 48  BILITOT 0.5  PROT 5.9*  ALBUMIN 2.3*   No results for input(s): LIPASE, AMYLASE in the last 168 hours. No results for input(s): AMMONIA in the last 168 hours. CBC:  Recent Labs Lab 04/09/14 1639 04/10/14 0536 04/11/14 0415 04/12/14 0430  WBC 12.2* 5.8 5.1 4.0  NEUTROABS 9.8*  --   --   --   HGB 10.9* 9.1* 9.1* 9.6*  HCT 35.2* 29.2* 28.5* 30.6*  MCV 88.2 88.2 86.4 87.4  PLT 103* 78* 74* 92*   Cardiac Enzymes:  Recent Labs Lab 04/10/14 0005 04/10/14 0536 04/10/14 1000  TROPONINI 0.90* 0.56* 0.40*   BNP (last 3  results) No results for input(s): PROBNP in the last 8760 hours. CBG:  Recent Labs Lab 04/11/14 1343 04/11/14 1648 04/11/14 2020 04/12/14 0426 04/12/14 0912  GLUCAP 106* 85 80 106* 96    Recent Results (from the past 240 hour(s))  Urine culture     Status: None   Collection Time: 04/09/14  6:18 PM  Result Value Ref Range Status   Specimen Description URINE, CLEAN CATCH  Final   Special Requests NONE  Final   Colony Count NO GROWTH Performed at [facility]   Final   Culture NO GROWTH Performed at [facility]   Final   Report Status 04/11/2014 FINAL  Final  Clostridium Difficile by PCR     Status: None   Collection Time: 04/09/14  6:18 PM  Result Value Ref Range Status   C difficile by pcr NEGATIVE NEGATIVE Final  Stool culture     Status: None (Preliminary result)   Collection Time: 04/09/14  6:18 PM  Result Value Ref Range Status   Specimen Description STOOL  Final   Special Requests NONE  Final   Culture   Final    NO SUSPICIOUS COLONIES, CONTINUING TO HOLD Note: REDUCED NORMAL FLORA PRESENT Performed at [facility]    Report Status PENDING  Incomplete  Blood Culture (routine x 2)     Status: None (Preliminary result)   Collection Time: 04/09/14  7:18 PM  Result Value Ref Range Status   Specimen Description BLOOD ARM RIGHT  Final   Special Requests BOTTLES DRAWN AEROBIC AND ANAEROBIC 5CC  Final   Culture   Final           BLOOD CULTURE RECEIVED NO GROWTH TO DATE CULTURE WILL BE HELD FOR 5 DAYS BEFORE ISSUING A FINAL NEGATIVE REPORT Performed at [facility]    Report Status PENDING  Incomplete  Blood Culture (routine x 2)     Status: None (Preliminary result)   Collection Time: 04/09/14  8:23 PM  Result Value Ref Range Status   Specimen Description BLOOD ARM LEFT  Final   Special Requests BOTTLES DRAWN AEROBIC AND ANAEROBIC 5CC  Final   Culture   Final           BLOOD CULTURE RECEIVED NO GROWTH TO DATE CULTURE WILL BE HELD  FOR 5 DAYS BEFORE ISSUING A FINAL NEGATIVE REPORT Performed at [facility]    Report Status PENDING  Incomplete  MRSA PCR Screening     Status: None   Collection Time: 04/10/14  2:05 AM  Result Value Ref Range Status   MRSA by PCR NEGATIVE NEGATIVE Final    Comment:        The GeneXpert MRSA Assay (FDA approved for NASAL specimens only), is one component of a comprehensive MRSA colonization surveillance program. It is not intended to diagnose MRSA infection nor to guide or monitor treatment for MRSA infections.      Studies: No results found.  Scheduled Meds: . antiseptic oral rinse  7 mL Mouth Rinse q12n4p  . chlorhexidine  15 mL Mouth Rinse BID  . doxazosin  4 mg Oral QHS  . famotidine  40 mg Per Tube Daily  . feeding supplement (PRO-STAT SUGAR FREE 64)  30 mL Per Tube Q1200  . fesoterodine  8 mg Oral Daily  . free water  100 mL Per Tube 3 times per day  . meropenem (MERREM) IV  1 g Intravenous Q8H  . metoprolol succinate  25 mg Oral Daily  . simvastatin  20 mg Oral Daily  . sodium chloride  3 mL Intravenous Q12H  . vancomycin  1,250 mg Intravenous Q12H   Continuous Infusions: . sodium chloride Stopped (04/12/14 1012)  . feeding supplement (JEVITY 1.2 CAL) 1,000 mL (04/12/14 0636)    Principal Problem:   Sepsis Active Problems:   History of DVT of lower extremity (Feb 2015) -post IVC filter   Aortic valve disorder   History of Bacteremia due to Pseudomonas   Dysphagia   Status post insertion of percutaneous endoscopic gastrostomy (PEG) tube   CKD (chronic kidney disease), stage III   HCAP (healthcare-associated pneumonia)   FTT (failure to thrive) in adult   Hypotension   Elevated troponin   Bacteria in urine   Blood poisoning    Time spent: 40 mins    [facility] MD [facility] Pager [phone]. If 7PM-7AM, please contact night-coverage at [email or web address], password [facility] 04/12/2014, 10:15 AM  LOS: 3 days

## 2014-04-12 NOTE — Clinical Social Work Psychosocial (Signed)
Clinical Social Work Department BRIEF PSYCHOSOCIAL ASSESSMENT 04/12/2014  Patient:  [name]     Account Number:  [id number]     Admit date:  04/09/2014  Clinical Social Worker:  Domenica Reamer, [place]  Date/Time:  04/12/2014 02:12 PM  Referred by:  Physician  Date Referred:  04/12/2014 Referred for  SNF Placement   Other Referral:   Interview type:  Patient Other interview type:    PSYCHOSOCIAL DATA Living Status:  FACILITY Admitted from facility:  [facility] Level of care:  [place] Primary support name:  Wildon Cuevas Primary support relationship to patient:  SPOUSE Degree of support available:   Patient reports high level of support- lives with wife and has a son and daughter who lives next door    CURRENT CONCERNS Current Concerns  Post-Acute Placement   Other Concerns:    SOCIAL WORK ASSESSMENT / PLAN CSW spoke to patient concerning recommendation for returning to SNF at time of DC- patient is agreeable to return to [facility] at time of DC if he still has limited mobility.   Assessment/plan status:  Psychosocial Support/Ongoing Assessment of Needs Other assessment/ plan:   FL2 update   Information/referral to community resources:   none    PATIENT'S/FAMILY'S RESPONSE TO PLAN OF CARE: Patient is agreeable to plan for return to SNF but is hopeful that he will be able to return home where he lives with his wife after a week at rehab.       Domenica Reamer, [place] Social Worker [phone]

## 2014-04-12 NOTE — Progress Notes (Signed)
Called report to [place] spoke with Audiological scientist. Pt will be transferred to [id number]

## 2014-04-13 LAB — BASIC METABOLIC PANEL
Anion gap: 4 — ABNORMAL LOW (ref 5–15)
BUN: 22 mg/dL (ref 6–23)
CALCIUM: 8.5 mg/dL (ref 8.4–10.5)
CO2: 26 mmol/L (ref 19–32)
Chloride: 110 mmol/L (ref 96–112)
Creatinine, Ser: 0.85 mg/dL (ref 0.50–1.35)
GFR calc Af Amer: >90 mL/min (ref >90)
GFR, EST NON AFRICAN AMERICAN: 79 mL/min — AB (ref >90)
Glucose, Bld: 102 mg/dL — ABNORMAL HIGH (ref 70–99)
POTASSIUM: 3.5 mmol/L (ref 3.5–5.1)
Sodium: 140 mmol/L (ref 135–145)

## 2014-04-13 LAB — GLUCOSE, CAPILLARY
GLUCOSE-CAPILLARY: 108 mg/dL — AB (ref 70–99)
Glucose-Capillary: 94 mg/dL (ref 70–99)
Glucose-Capillary: 134 mg/dL — ABNORMAL HIGH (ref 70–99)
Glucose-Capillary: 107 mg/dL — ABNORMAL HIGH (ref 70–99)
Glucose-Capillary: 103 mg/dL — ABNORMAL HIGH (ref 70–99)

## 2014-04-13 LAB — STOOL CULTURE

## 2014-04-13 LAB — CBC
HCT: 27.2 % — ABNORMAL LOW (ref 39.0–52.0)
Hemoglobin: 8.7 g/dL — ABNORMAL LOW (ref 13.0–17.0)
MCH: 27.1 pg (ref 26.0–34.0)
MCHC: 32 g/dL (ref 30.0–36.0)
MCV: 84.7 fL (ref 78.0–100.0)
Platelets: 82 10*3/uL — ABNORMAL LOW (ref 150–400)
RBC: 3.21 MIL/uL — AB (ref 4.22–5.81)
RDW: 18.4 % — ABNORMAL HIGH (ref 11.5–15.5)
WBC: 4.5 10*3/uL (ref 4.0–10.5)

## 2014-04-13 MED ORDER — METOPROLOL TARTRATE 25 MG/10 ML ORAL SUSPENSION
12.5000 mg | Freq: Two times a day (BID) | ORAL | Status: DC
  Administered 2014-04-13 – 2014-04-15 (×4): 12.5 mg
  Filled 2014-04-13 (×5): qty 5

## 2014-04-13 MED ORDER — POTASSIUM CHLORIDE 20 MEQ/15ML (10%) PO SOLN
40.0000 meq | Freq: Once | ORAL | Status: AC
  Administered 2014-04-13: 40 meq
  Filled 2014-04-13: qty 30

## 2014-04-13 MED ORDER — METOPROLOL TARTRATE 12.5 MG HALF TABLET
12.5000 mg | ORAL_TABLET | Freq: Two times a day (BID) | ORAL | Status: DC
  Administered 2014-04-13: 12.5 mg via ORAL
  Filled 2014-04-13 (×2): qty 1

## 2014-04-13 MED ORDER — POTASSIUM CHLORIDE CRYS ER 20 MEQ PO TBCR
40.0000 meq | EXTENDED_RELEASE_TABLET | Freq: Once | ORAL | Status: DC
Start: 2014-04-13 — End: 2014-04-13

## 2014-04-13 NOTE — Progress Notes (Signed)
Pt receiving Tube Feeding via PEG  non compliant with HOB > 30 degrees sating "always have head straighhened"  HOB locked at 31 degrees. Frustrated and becoming verbally abusive to this Probation officer due to inability to control head of bed. Provided rationale but unsuccessful at getting him comprehend the risk of getting tube feeding into his lungs if lay flat. States "have been in hospital for twelve weeks" and that "no one had said so" Verbalized "someone is going to hear this" Also refusing to wear SCD's.all day kicking it off bed. Marland Kitchen

## 2014-04-13 NOTE — Progress Notes (Signed)
ANTIBIOTIC CONSULT NOTE - FOLLOW UP  Pharmacy Consult for Meropenem Indication: rule out sepsis  Allergies  Allergen Reactions  . Atorvastatin Other (See Comments)    Causes constipation  . Penicillins Other (See Comments)    Other reaction(s): RASH HIVES     Patient Measurements: Height: 6\' 2"  (188 cm) Weight: 211 lb 3.2 oz (95.8 kg) IBW/kg (Calculated) : 82.2  Vital Signs: Temp: 97.9 F (36.6 C) (01/30 1313) Temp Source: Oral (01/30 1313) BP: 100/72 mmHg (01/30 1313) Pulse Rate: 89 (01/30 1313) Intake/Output from previous day: 01/29 0701 - 01/30 0700 In: 468 [I.V.:228; NG/GT:240] Out: 852 [Urine:850; Stool:2] Intake/Output from this shift:    Labs:  Recent Labs  04/11/14 0415 04/12/14 0430 04/13/14 0358  WBC 5.1 4.0 4.5  HGB 9.1* 9.6* 8.7*  PLT 74* 92* 82*  CREATININE 1.11 0.94 0.85   Estimated Creatinine Clearance: 77.9 mL/min (by C-G formula based on Cr of 0.85). No results for input(s): VANCOTROUGH, VANCOPEAK, VANCORANDOM, GENTTROUGH, GENTPEAK, GENTRANDOM, TOBRATROUGH, TOBRAPEAK, TOBRARND, AMIKACINPEAK, AMIKACINTROU, AMIKACIN in the last 72 hours.   Microbiology: Recent Results (from the past 720 hour(s))  Blood Culture (routine x 2)     Status: None   Collection Time: 03/23/14  2:41 PM  Result Value Ref Range Status   Specimen Description BLOOD RIGHT ARM  Final   Special Requests BOTTLES DRAWN AEROBIC AND ANAEROBIC 5CC  Final   Culture   Final    NO GROWTH 5 DAYS Performed at [facility]    Report Status 03/29/2014 FINAL  Final  Blood Culture (routine x 2)     Status: None   Collection Time: 03/23/14  2:59 PM  Result Value Ref Range Status   Specimen Description BLOOD LEFT ARM  Final   Special Requests BOTTLES DRAWN AEROBIC AND ANAEROBIC 5CC  Final   Culture   Final    NO GROWTH 5 DAYS Performed at [facility]    Report Status 03/29/2014 FINAL  Final  Urine culture     Status: None   Collection Time: 03/23/14  5:47 PM   Result Value Ref Range Status   Specimen Description URINE, CLEAN CATCH  Final   Special Requests NONE  Final   Colony Count NO GROWTH Performed at [facility]   Final   Culture NO GROWTH Performed at [facility]   Final   Report Status 03/25/2014 FINAL  Final  MRSA PCR Screening     Status: None   Collection Time: 03/23/14  7:16 PM  Result Value Ref Range Status   MRSA by PCR NEGATIVE NEGATIVE Final    Comment:        The GeneXpert MRSA Assay (FDA approved for NASAL specimens only), is one component of a comprehensive MRSA colonization surveillance program. It is not intended to diagnose MRSA infection nor to guide or monitor treatment for MRSA infections.   Clostridium Difficile by PCR     Status: None   Collection Time: 03/23/14  9:06 PM  Result Value Ref Range Status   C difficile by pcr NEGATIVE NEGATIVE Final  Urine culture     Status: None   Collection Time: 04/09/14  6:18 PM  Result Value Ref Range Status   Specimen Description URINE, CLEAN CATCH  Final   Special Requests NONE  Final   Colony Count NO GROWTH Performed at [facility]   Final   Culture NO GROWTH Performed at [facility]   Final   Report Status  04/11/2014 FINAL  Final  Clostridium Difficile by PCR     Status: None   Collection Time: 04/09/14  6:18 PM  Result Value Ref Range Status   C difficile by pcr NEGATIVE NEGATIVE Final  Stool culture     Status: None (Preliminary result)   Collection Time: 04/09/14  6:18 PM  Result Value Ref Range Status   Specimen Description STOOL  Final   Special Requests NONE  Final   Culture   Final    NO SUSPICIOUS COLONIES, CONTINUING TO HOLD Note: REDUCED NORMAL FLORA PRESENT Performed at [facility]    Report Status PENDING  Incomplete  Blood Culture (routine x 2)     Status: None (Preliminary result)   Collection Time: 04/09/14  7:18 PM  Result Value Ref Range Status   Specimen Description BLOOD ARM RIGHT   Final   Special Requests BOTTLES DRAWN AEROBIC AND ANAEROBIC 5CC  Final   Culture   Final           BLOOD CULTURE RECEIVED NO GROWTH TO DATE CULTURE WILL BE HELD FOR 5 DAYS BEFORE ISSUING A FINAL NEGATIVE REPORT Performed at [facility]    Report Status PENDING  Incomplete  Blood Culture (routine x 2)     Status: None (Preliminary result)   Collection Time: 04/09/14  8:23 PM  Result Value Ref Range Status   Specimen Description BLOOD ARM LEFT  Final   Special Requests BOTTLES DRAWN AEROBIC AND ANAEROBIC 5CC  Final   Culture   Final           BLOOD CULTURE RECEIVED NO GROWTH TO DATE CULTURE WILL BE HELD FOR 5 DAYS BEFORE ISSUING A FINAL NEGATIVE REPORT Performed at [facility]    Report Status PENDING  Incomplete  MRSA PCR Screening     Status: None   Collection Time: 04/10/14  2:05 AM  Result Value Ref Range Status   MRSA by PCR NEGATIVE NEGATIVE Final    Comment:        The GeneXpert MRSA Assay (FDA approved for NASAL specimens only), is one component of a comprehensive MRSA colonization surveillance program. It is not intended to diagnose MRSA infection nor to guide or monitor treatment for MRSA infections.     Medical History: Past Medical History  Diagnosis Date  . Hypertension   . BPH (benign prostatic hyperplasia)   . Hyperlipidemia   . Cancer 2008-2009    prostate TREATED WITH RADIATION  . AAA (abdominal aortic aneurysm) 04/2012    STENTING OF AAA IN [place]  . Junctional cardiac arrhythmia     Occurred postoperatively after urologic surgery  . Severe sinus bradycardia     Occurred postoperatively after urologic surgery  . Aortic valve disorders   . Other primary cardiomyopathies   . Dysphagia   . Status post insertion of percutaneous endoscopic gastrostomy (PEG) tube 02/11/14  . Bacteremia due to Pseudomonas   . Infected aortic graft     Medications:  Prescriptions prior to admission  Medication Sig Dispense Refill Last Dose  .  acetaminophen (TYLENOL) 500 MG tablet Take 1,000 mg by mouth every 6 (six) hours as needed for mild pain or moderate pain.   Past Week at Unknown time  . albuterol (PROVENTIL) (2.5 MG/3ML) 0.083% nebulizer solution Take 3 mLs (2.5 mg total) by nebulization every 2 (two) hours as needed for wheezing or shortness of breath. 75 mL 12 Past Week at Unknown time  . Amino Acids-Protein Hydrolys (  FEEDING SUPPLEMENT, PRO-STAT SUGAR FREE 64,) LIQD Take 30 mLs by mouth 3 (three) times daily with meals. 900 mL 0 unknown at unknown  . doxazosin (CARDURA) 4 MG tablet Take 4 mg by mouth at bedtime.   Past Week at Unknown time  . famotidine (PEPCID) 40 MG/5ML suspension Place 5 mLs (40 mg total) into feeding tube daily. 50 mL 0 Past Week at Unknown time  . lactulose (CHRONULAC) 10 GM/15ML solution Take 10 g by mouth daily. Every day per [facility]   Past Week at Unknown time  . levofloxacin (LEVAQUIN) 750 MG tablet Please resume oral levaquin when IV Meropenem is complete on 12/25.  Continue indefintely for suppression of infection.  This medication will be managed by Infectious Disease at [facility]. (Patient taking differently: Take 750 mg by mouth daily. Please resume oral levaquin when IV Meropenem is complete on 12/25.  Continue indefintely for suppression of infection.  This medication will be managed by Infectious Disease at [facility].)   Past Week at Unknown time  . LORazepam (ATIVAN) 2 MG/ML concentrated solution Take 0.1 mLs (0.2 mg total) by mouth every 6 (six) hours as needed for anxiety. 30 mL 0 Past Week at Unknown time  . methylphenidate (RITALIN) 5 MG tablet Take 1 tablet (5 mg total) by mouth daily. 30 tablet 0 Past Week at Unknown time  . metoprolol succinate (TOPROL-XL) 25 MG 24 hr tablet Take 25 mg by mouth daily.   Past Week at 0800  . mirtazapine (REMERON) 30 MG tablet Take 30 mg by mouth at bedtime.   Past Week at Unknown time  . Nutritional Supplements (FEEDING SUPPLEMENT, JEVITY 1.2 CAL,) LIQD Place 1,000 mLs into  feeding tube continuous.  0 unknown at unknown  . rifampin (RIFADIN) 300 MG capsule Take 300 mg by mouth every 12 (twelve) hours.   Past Week at Unknown time  . simvastatin (ZOCOR) 20 MG tablet Take 20 mg by mouth daily.   Past Week at Unknown time  . tolterodine (DETROL LA) 4 MG 24 hr capsule Take 4 mg by mouth daily.   Past Week at Unknown time  . traMADol (ULTRAM) 50 MG tablet Take 1 tablet (50 mg total) by mouth every 6 (six) hours as needed for moderate pain. 60 tablet 0 Past Week at Unknown time  . triamcinolone cream (KENALOG) 0.1 % Apply 1 application topically 2 (two) times daily as needed (rash).   Past Week at Unknown time  . vancomycin (VANCOCIN) 50 mg/mL oral solution For C-Diff.  Continue indefinitely until patient is seen in follow up at [facility] Infectious Disease. (as the patient will be on continuous Levaquin). (Patient taking differently: Take 125 mg by mouth 4 (four) times daily. For C-Diff.  Continue indefinitely until patient is seen in follow up at [facility] Infectious [facility]. (as the patient will be on continuous Levaquin).)   Past Week at Unknown time  . Water For Irrigation, Sterile (FREE WATER) SOLN Place 100 mLs into feeding tube every 8 (eight) hours.   03/23/2014 at Unknown time   Assessment: 81 yo male with fever, possible sepsis, h/o infected endovascular graft, on empiric antibiotics. Since admission, patient's LA has trended down to 1.9 and SCr has improved to 1.46. CrCl ~ 50 mL/min. ID consult pending.   Pt is on day 5 of antibiotics for rule out sepsis likely 2/2 HCAP. Pt is afebrile, WBC wnl, sCr 0.85.   Merrem 1/26>>  Vanc 1/26>>1/30 PO Vanc 1/27>>1/28  1/26 BCx2: ngtd 1/26 UCx: neg  1/26 SCx: neg 1/26 Cdiff: neg     Goal of Therapy:  Resolution of infection  Plan:  -Stop vancomycin -Continue Merrem 1 gm IV Q 8 hours  -Monitor daily CBC, renal fx, cultures and clinical progress -F/u ID consult   Andrey Cota. Diona Foley, PharmD Clinical Pharmacist Pager [phone]

## 2014-04-13 NOTE — Progress Notes (Signed)
[facility] PROGRESS NOTE  [name] MAU:[id number] DOB: [birth year] DOA: 04/09/2014 PCP: [name], MD  brief narrative 81 y/o male resident of SNF, with hx of prostate ca, AAA repair with graft infection and chronic endovascular leak causing anemia, hx of DVT with IVC filter, pseudomonas bacteremia, hx of Cdiff on oral vancomycin, dysphagia s/p PEG placement presented with several episodes of diarrhea.Patient is on multiple antibiotics chronically for his infected Pseudomonas endovascular stent and also recurrent hospitalization with various infections. Along with diarrhea he was also noted to have low blood pressure and therefore he was referred to ER. Pt also c/o of chills, chronic cough and poor appetite. Pt found to be hypotensive and tachycardic with elevated lactic acid and pneumonia on CXR meeting criteria i for sepsis and admitted to stepdown.      Assessment/Plan: #1 sepsis likely secondary to healthcare associated pneumonia Patient had presented with hypotension, tachycardia and elevated lactic acid level meeting criteria for sepsis with chest x-ray consistent with a underlying pneumonia. Likely a healthcare associated pneumonia. Patient with clinical improvement. Blood pressure has improved with antibiotics and hydration. Patient has been pancultured and cultures are pending. C. difficile is negative. NSL IVF. Continue empiric IV antibiotics.  #2 healthcare associated pneumonia Blood cultures are pending. Clinical improvement. Urine Legionella antigen pending urine pneumococcus antigen pending continue empiric IV meropenem. Will discontinue IV vancomycin. Follow.  #3 bacteriuria Urine cultures negative.  #4 elevated troponin Likely secondary to a demand ischemia. Repeat 2-D echo with EF of 35-40% with diffuse hypokinesis with no significant change from prior 2-D echo. Patient is asymptomatic. Troponins are trending down. Admitting hospitalist discussed with  cardiology fellow and recommended monitoring serial enzymes. Follow.  #5 Tachycardia vs SVT Secondary to #1. Resolved. Continue home dose beta blocker.  #6 history of recent C. difficile Repeat C. difficile PCR is negative. Oral vancomycin was discontinued. Diarrhea improving.  #7 chronic kidney disease stage II Stable. Avoid nephrotoxins.  #8 thrombocytopenia Chronic in nature. Platelets stable. Follow. Aspirin and Plavix have been held.  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 -Last admission he was seen by palliative care, family wishes to continue with care.  -Finished meropenem and rifampin March 07 2014 for recurrent bacteriemia, -on levaquin as outpt. Need to discuss with ID prior to transition from current abx for transition to oral abx and duration of same  #10 hematuria in the setting of history of prostate cancer radiation therapy Patient previously on continuous bladder irrigation. Urology. Aspirin and Plavix were discontinued during last hospitalization. Will not resume at this time. Follow H&H and platelets.  #11 chronic dysphagia/failure to thrive/severe protein calorie malnutrition Continue current tube feeds.  #12 anemia of chronic disease/anemia due to chronic blood loss H&H stable.  #13 chronic combined systolic EF of 56-38% per 2-D echo of 04/10/2014 and grade 1 diastolic CHF Currently compensated. 2-D echo with EF of 35-40% with diffuse hypokinesis with no significant change from prior 2-D echo. Continue current dose of metoprolol.  #14 history of lower extremity DVT (February 2015) status post IVC filter.  #15 prophylaxis SCDs for DVT prophylaxis.  Code Status: Partial: Family Communication: Updated patient no family at bedside. Disposition Plan: Back to skilled nursing facility  hopefully 1-2 days.   Consultants:  None  Procedures:  Chest x-ray 04/09/2014  2-D echo 04/10/2014  Antibiotics:  IV meropenem 1/26/  2016  IV vancomycin 04/09/2014>>>>04/13/14  HPI/Subjective: Patient states he's feeling better. No chest pain. No shortness of breath. No complaints.  Objective: Filed Vitals:   04/13/14 0953  BP: 120/74  Pulse: 91  Temp:   Resp:     Intake/Output Summary (Last 24 hours) at 04/13/14 1118 Last data filed at 04/13/14 0345  Gross per 24 hour  Intake      0 ml  Output    852 ml  Net   -852 ml   Filed Weights   04/12/14 0344 04/12/14 0500 04/13/14 0344  Weight: 95.9 kg (211 lb 6.7 oz) 95.8 kg (211 lb 3.2 oz) 95.8 kg (211 lb 3.2 oz)    Exam:   General:  NAD  Cardiovascular: RRR  Respiratory: CTAB  Abdomen: Soft, nontender, nondistended, positive bowel sounds. PEG tube site with no signs or symptoms of infection.  Musculoskeletal: No clubbing cyanosis or edema.  Data Reviewed: Basic Metabolic Panel:  Recent Labs Lab 04/09/14 1639 04/10/14 0536 04/11/14 0415 04/12/14 0430 04/13/14 0358  NA 141 138 140 143 140  K 5.0 4.4 4.0 4.0 3.5  CL 102 107 110 112 110  CO2 25 30 23 28 26   GLUCOSE 128* 97 73 119* 102*  BUN 43* 40* 32* 27* 22  CREATININE 2.00* 1.46* 1.11 0.94 0.85  CALCIUM 9.7 8.6 8.6 8.7 8.5   Liver Function Tests:  Recent Labs Lab 04/10/14 0536  AST 24  ALT 11  ALKPHOS 48  BILITOT 0.5  PROT 5.9*  ALBUMIN 2.3*   No results for input(s): LIPASE, AMYLASE in the last 168 hours. No results for input(s): AMMONIA in the last 168 hours. CBC:  Recent Labs Lab 04/09/14 1639 04/10/14 0536 04/11/14 0415 04/12/14 0430 04/13/14 0358  WBC 12.2* 5.8 5.1 4.0 4.5  NEUTROABS 9.8*  --   --   --   --   HGB 10.9* 9.1* 9.1* 9.6* 8.7*  HCT 35.2* 29.2* 28.5* 30.6* 27.2*  MCV 88.2 88.2 86.4 87.4 84.7  PLT 103* 78* 74* 92* 82*   Cardiac Enzymes:  Recent Labs Lab 04/10/14 0005 04/10/14 0536 04/10/14 1000   TROPONINI 0.90* 0.56* 0.40*   BNP (last 3 results) No results for input(s): PROBNP in the last 8760 hours. CBG:  Recent Labs Lab 04/12/14 1535 04/12/14 2004 04/12/14 2341 04/13/14 0340 04/13/14 0812  GLUCAP 93 95 112* 94 134*    Recent Results (from the past 240 hour(s))  Urine culture     Status: None   Collection Time: 04/09/14  6:18 PM  Result Value Ref Range Status   Specimen Description URINE, CLEAN CATCH  Final   Special Requests NONE  Final   Colony Count NO GROWTH Performed at [facility]   Final   Culture NO GROWTH Performed at [facility]   Final   Report Status 04/11/2014 FINAL  Final  Clostridium Difficile by PCR     Status: None   Collection Time: 04/09/14  6:18 PM  Result Value Ref Range Status   C difficile by pcr NEGATIVE NEGATIVE Final  Stool culture     Status: None (Preliminary result)   Collection Time: 04/09/14  6:18 PM  Result Value Ref Range Status   Specimen Description STOOL  Final   Special Requests NONE  Final   Culture   Final    NO SUSPICIOUS COLONIES, CONTINUING TO HOLD Note: REDUCED NORMAL FLORA PRESENT Performed at [facility]    Report Status PENDING  Incomplete  Blood  Culture (routine x 2)     Status: None (Preliminary result)   Collection Time: 04/09/14  7:18 PM  Result Value Ref Range Status   Specimen Description BLOOD ARM RIGHT  Final   Special Requests BOTTLES DRAWN AEROBIC AND ANAEROBIC 5CC  Final   Culture   Final           BLOOD CULTURE RECEIVED NO GROWTH TO DATE CULTURE WILL BE HELD FOR 5 DAYS BEFORE ISSUING A FINAL NEGATIVE REPORT Performed at [facility]    Report Status PENDING  Incomplete  Blood Culture (routine x 2)     Status: None (Preliminary result)   Collection Time: 04/09/14  8:23 PM  Result Value Ref Range Status   Specimen Description BLOOD ARM LEFT  Final   Special Requests BOTTLES DRAWN AEROBIC AND ANAEROBIC 5CC  Final   Culture   Final           BLOOD CULTURE  RECEIVED NO GROWTH TO DATE CULTURE WILL BE HELD FOR 5 DAYS BEFORE ISSUING A FINAL NEGATIVE REPORT Performed at [facility]    Report Status PENDING  Incomplete  MRSA PCR Screening     Status: None   Collection Time: 04/10/14  2:05 AM  Result Value Ref Range Status   MRSA by PCR NEGATIVE NEGATIVE Final    Comment:        The GeneXpert MRSA Assay (FDA approved for NASAL specimens only), is one component of a comprehensive MRSA colonization surveillance program. It is not intended to diagnose MRSA infection nor to guide or monitor treatment for MRSA infections.      Studies: No results found.  Scheduled Meds: . antiseptic oral rinse  7 mL Mouth Rinse q12n4p  . chlorhexidine  15 mL Mouth Rinse BID  . doxazosin  4 mg Oral QHS  . famotidine  40 mg Per Tube Daily  . feeding supplement (PRO-STAT SUGAR FREE 64)  30 mL Per Tube Q1200  . fesoterodine  8 mg Oral Daily  . free water  100 mL Per Tube 3 times per day  . meropenem (MERREM) IV  1 g Intravenous Q8H  . metoprolol tartrate  12.5 mg Oral BID  . simvastatin  20 mg Oral Daily  . sodium chloride  3 mL Intravenous Q12H   Continuous Infusions: . feeding supplement (JEVITY 1.2 CAL) 1,000 mL (04/12/14 0636)    Principal Problem:   Sepsis Active Problems:   History of DVT of lower extremity (Feb 2015) -post IVC filter   Aortic valve disorder   History of Bacteremia due to Pseudomonas   Dysphagia   Status post insertion of percutaneous endoscopic gastrostomy (PEG) tube   CKD (chronic kidney disease), stage III   HCAP (healthcare-associated pneumonia)   FTT (failure to thrive) in adult   Hypotension   Elevated troponin   Bacteria in urine   Blood poisoning    Time spent: 40 mins    [facility] MD [facility] Pager [phone]. If 7PM-7AM, please contact night-coverage at [email or web address], password [facility] 04/13/2014, 11:18 AM  LOS: 4 days

## 2014-04-14 LAB — GLUCOSE, CAPILLARY
GLUCOSE-CAPILLARY: 99 mg/dL (ref 70–99)
GLUCOSE-CAPILLARY: 111 mg/dL — AB (ref 70–99)
Glucose-Capillary: 100 mg/dL — ABNORMAL HIGH (ref 70–99)
Glucose-Capillary: 100 mg/dL — ABNORMAL HIGH (ref 70–99)
Glucose-Capillary: 101 mg/dL — ABNORMAL HIGH (ref 70–99)
Glucose-Capillary: 113 mg/dL — ABNORMAL HIGH (ref 70–99)
Glucose-Capillary: 125 mg/dL — ABNORMAL HIGH (ref 70–99)

## 2014-04-14 LAB — CBC
HEMATOCRIT: 26.3 % — AB (ref 39.0–52.0)
HEMOGLOBIN: 8.4 g/dL — AB (ref 13.0–17.0)
MCH: 27.2 pg (ref 26.0–34.0)
MCHC: 31.9 g/dL (ref 30.0–36.0)
MCV: 85.1 fL (ref 78.0–100.0)
Platelets: 81 10*3/uL — ABNORMAL LOW (ref 150–400)
RBC: 3.09 MIL/uL — AB (ref 4.22–5.81)
RDW: 18.3 % — ABNORMAL HIGH (ref 11.5–15.5)
WBC: 3.7 10*3/uL — ABNORMAL LOW (ref 4.0–10.5)

## 2014-04-14 LAB — BASIC METABOLIC PANEL
Anion gap: 4 — ABNORMAL LOW (ref 5–15)
BUN: 19 mg/dL (ref 6–23)
CO2: 26 mmol/L (ref 19–32)
Calcium: 8.4 mg/dL (ref 8.4–10.5)
Chloride: 110 mmol/L (ref 96–112)
Creatinine, Ser: 0.74 mg/dL (ref 0.50–1.35)
GFR calc non Af Amer: 84 mL/min — ABNORMAL LOW (ref >90)
Glucose, Bld: 110 mg/dL — ABNORMAL HIGH (ref 70–99)
POTASSIUM: 3.7 mmol/L (ref 3.5–5.1)
SODIUM: 140 mmol/L (ref 135–145)

## 2014-04-14 MED ORDER — LEVOFLOXACIN 750 MG PO TABS
750.0000 mg | ORAL_TABLET | Freq: Every day | ORAL | Status: DC
  Administered 2014-04-14 – 2014-04-15 (×2): 750 mg
  Filled 2014-04-14 (×2): qty 1

## 2014-04-14 NOTE — Progress Notes (Signed)
[facility] PROGRESS NOTE  [name] IEP:[id number] DOB: 11-Nov-1929 DOA: 04/09/2014 PCP: [name], MD  brief narrative 81 y/o male resident of SNF, with hx of prostate ca, AAA repair with graft infection and chronic endovascular leak causing anemia, hx of DVT with IVC filter, pseudomonas bacteremia, hx of Cdiff on oral vancomycin, dysphagia s/p PEG placement presented with several episodes of diarrhea.Patient is on multiple antibiotics chronically for his infected Pseudomonas endovascular stent and also recurrent hospitalization with various infections. Along with diarrhea he was also noted to have low blood pressure and therefore he was referred to ER. Pt also c/o of chills, chronic cough and poor appetite. Pt found to be hypotensive and tachycardic with elevated lactic acid and pneumonia on CXR meeting criteria i for sepsis and admitted to stepdown.      Assessment/Plan: #1 sepsis likely secondary to healthcare associated pneumonia Patient had presented with hypotension, tachycardia and elevated lactic acid level meeting criteria for sepsis with chest x-ray consistent with a underlying pneumonia. Likely a healthcare associated pneumonia. Patient with clinical improvement. Blood pressure has improved with antibiotics and hydration. Patient has been pancultured and cultures are pending. C. difficile is negative. NSL IVF. Change empiric IV antibiotics to oral levaquin.  #2 healthcare associated pneumonia Blood cultures are pending. Clinical improvement. Urine Legionella antigen pending urine pneumococcus antigen pending. D/C IV meropenem. Start oral Levaquin.   #3 bacteriuria Urine cultures negative.  #4 elevated troponin Likely secondary to a demand ischemia. Repeat 2-D echo with EF of 35-40% with diffuse hypokinesis with no significant change from prior 2-D echo. Patient is asymptomatic. Troponins are trending down. Admitting hospitalist discussed with cardiology fellow  and recommended monitoring serial enzymes. Follow.  #5 Tachycardia vs SVT Secondary to #1. Resolved. Continue home dose beta blocker.  #6 history of recent C. difficile Repeat C. difficile PCR is negative. Oral vancomycin was discontinued. Diarrhea improving.  #7 chronic kidney disease stage II Stable. Avoid nephrotoxins.  #8 thrombocytopenia Chronic in nature. Platelets stable. Follow. Aspirin and Plavix have been held.  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 -Last admission he was seen by palliative care, family wishes to continue with care.  -Finished meropenem and rifampin March 07 2014 for recurrent bacteriemia, -on levaquin as outpt. Discuss with ID and patient will be transitioned back to home regimen of Levaquin.   #10 hematuria in the setting of history of prostate cancer radiation therapy Patient previously on continuous bladder irrigation. Urology. Aspirin and Plavix were discontinued during last hospitalization. Will not resume at this time. Follow H&H and platelets.  #11 chronic dysphagia/failure to thrive/severe protein calorie malnutrition Continue current tube feeds.  #12 anemia of chronic disease/anemia due to chronic blood loss H&H stable.  #13 chronic combined systolic EF of 66-06% per 2-D echo of 04/10/2014 and grade 1 diastolic CHF Currently compensated. 2-D echo with EF of 35-40% with diffuse hypokinesis with no significant change from prior 2-D echo. Continue current dose of metoprolol.  #14 history of lower extremity DVT (February 2015) status post IVC filter.  #15 prophylaxis SCDs for DVT prophylaxis.  Code Status: Partial: Family Communication: Updated patient no family at bedside. Disposition Plan: Back to skilled nursing facility hopefully  tomorrow.   Consultants:  None  Procedures:  Chest x-ray 04/09/2014  2-D echo 04/10/2014  Antibiotics:  IV meropenem 1/26/ 2016>>>>04/14/14  IV vancomycin 04/09/2014>>>>04/13/14  Oral Levaquin 04/14/2014  HPI/Subjective: Patient states he's feeling better. No chest pain. No shortness of breath. No complaints.  Objective: Filed Vitals:   04/14/14 0417  BP: 114/76  Pulse: 92  Temp: 98.2 F (36.8 C)  Resp: 18    Intake/Output Summary (Last 24 hours) at 04/14/14 1119 Last data filed at 04/14/14 0805  Gross per 24 hour  Intake      0 ml  Output    400 ml  Net   -400 ml   Filed Weights   04/12/14 0500 04/13/14 0344 04/14/14 0417  Weight: 95.8 kg (211 lb 3.2 oz) 95.8 kg (211 lb 3.2 oz) 95.3 kg (210 lb 1.6 oz)    Exam:   General:  NAD  Cardiovascular: RRR  Respiratory: CTAB  Abdomen: Soft, nontender, nondistended, positive bowel sounds. PEG tube site with no signs or symptoms of infection.  Musculoskeletal: No clubbing cyanosis or edema.  Data Reviewed: Basic Metabolic Panel:  Recent Labs Lab 04/10/14 0536 04/11/14 0415 04/12/14 0430 04/13/14 0358 04/14/14 0250  NA 138 140 143 140 140  K 4.4 4.0 4.0 3.5 3.7  CL 107 110 112 110 110  CO2 30 23 28 26 26   GLUCOSE 97 73 119* 102* 110*  BUN 40* 32* 27* 22 19  CREATININE 1.46* 1.11 0.94 0.85 0.74  CALCIUM 8.6 8.6 8.7 8.5 8.4   Liver Function Tests:  Recent Labs Lab 04/10/14 0536  AST 24  ALT 11  ALKPHOS 48  BILITOT 0.5  PROT 5.9*  ALBUMIN 2.3*   No results for input(s): LIPASE, AMYLASE in the last 168 hours. No results for input(s): AMMONIA in the last 168 hours. CBC:  Recent Labs Lab 04/09/14 1639 04/10/14 0536 04/11/14 0415 04/12/14 0430 04/13/14 0358 04/14/14 0250  WBC 12.2* 5.8 5.1 4.0 4.5 3.7*  NEUTROABS 9.8*  --   --   --   --   --   HGB 10.9* 9.1* 9.1* 9.6* 8.7* 8.4*  HCT 35.2* 29.2* 28.5* 30.6* 27.2* 26.3*  MCV 88.2 88.2 86.4 87.4 84.7 85.1  PLT 103* 78* 74* 92* 82*  81*   Cardiac Enzymes:  Recent Labs Lab 04/10/14 0005 04/10/14 0536 04/10/14 1000  TROPONINI 0.90* 0.56* 0.40*   BNP (last 3 results) No results for input(s): PROBNP in the last 8760 hours. CBG:  Recent Labs Lab 04/13/14 1726 04/13/14 1947 04/13/14 2329 04/14/14 0401 04/14/14 0757  GLUCAP 108* 103* 99 111* 101*    Recent Results (from the past 240 hour(s))  Urine culture     Status: None   Collection Time: 04/09/14  6:18 PM  Result Value Ref Range Status   Specimen Description URINE, CLEAN CATCH  Final   Special Requests NONE  Final   Colony Count NO GROWTH Performed at [facility]   Final   Culture NO GROWTH Performed at [facility]   Final   Report Status 04/11/2014 FINAL  Final  Clostridium Difficile by PCR     Status: None   Collection Time: 04/09/14  6:18 PM  Result Value Ref Range Status   C difficile by pcr NEGATIVE NEGATIVE Final  Stool culture     Status: None   Collection Time: 04/09/14  6:18 PM  Result Value Ref Range Status   Specimen Description STOOL  Final   Special Requests NONE  Final   Culture   Final    NO SALMONELLA, SHIGELLA, CAMPYLOBACTER, YERSINIA, OR E.COLI [id number] ISOLATED Note: REDUCED NORMAL FLORA PRESENT Performed at [facility]    Report Status 04/13/2014 FINAL  Final  Blood Culture (routine x 2)     Status: None (Preliminary result)   Collection Time: 04/09/14  7:18 PM  Result Value Ref Range Status   Specimen Description BLOOD ARM RIGHT  Final   Special Requests BOTTLES DRAWN AEROBIC AND ANAEROBIC 5CC  Final   Culture   Final           BLOOD CULTURE RECEIVED NO GROWTH TO DATE CULTURE WILL BE HELD FOR 5 DAYS BEFORE ISSUING A FINAL NEGATIVE REPORT Performed at [facility]    Report Status PENDING  Incomplete  Blood Culture (routine x 2)     Status: None (Preliminary result)   Collection Time: 04/09/14  8:23 PM  Result Value Ref Range Status   Specimen Description BLOOD ARM LEFT  Final    Special Requests BOTTLES DRAWN AEROBIC AND ANAEROBIC 5CC  Final   Culture   Final           BLOOD CULTURE RECEIVED NO GROWTH TO DATE CULTURE WILL BE HELD FOR 5 DAYS BEFORE ISSUING A FINAL NEGATIVE REPORT Performed at [facility]    Report Status PENDING  Incomplete  MRSA PCR Screening     Status: None   Collection Time: 04/10/14  2:05 AM  Result Value Ref Range Status   MRSA by PCR NEGATIVE NEGATIVE Final    Comment:        The GeneXpert MRSA Assay (FDA approved for NASAL specimens only), is one component of a comprehensive MRSA colonization surveillance program. It is not intended to diagnose MRSA infection nor to guide or monitor treatment for MRSA infections.      Studies: No results found.  Scheduled Meds: . antiseptic oral rinse  7 mL Mouth Rinse q12n4p  . chlorhexidine  15 mL Mouth Rinse BID  . doxazosin  4 mg Oral QHS  . famotidine  40 mg Per Tube Daily  . feeding supplement (PRO-STAT SUGAR FREE 64)  30 mL Per Tube Q1200  . fesoterodine  8 mg Oral Daily  . free water  100 mL Per Tube 3 times per day  . meropenem (MERREM) IV  1 g Intravenous Q8H  . metoprolol tartrate  12.5 mg Per Tube BID  . simvastatin  20 mg Oral Daily  . sodium chloride  3 mL Intravenous Q12H   Continuous Infusions: . feeding supplement (JEVITY 1.2 CAL) 1,000 mL (04/13/14 2217)    Principal Problem:   Sepsis Active Problems:   History of DVT of lower extremity (Feb 2015) -post IVC filter   Aortic valve disorder   History of Bacteremia due to Pseudomonas   Dysphagia   Status post insertion of percutaneous endoscopic gastrostomy (PEG) tube   CKD (chronic kidney disease), stage III   HCAP (healthcare-associated pneumonia)   FTT (failure to thrive) in adult   Hypotension   Elevated troponin   Bacteria in urine   Blood poisoning    Time spent: 40 mins    [facility] MD [facility] Pager [phone]. If 7PM-7AM, please contact night-coverage at [email or web address],  password [facility] 04/14/2014, 11:19 AM  LOS: 5 days

## 2014-04-14 NOTE — Progress Notes (Signed)
Per MD note- anticipate d/c back to [facility] tomorrow if medically stable.  CSW spoke to patient's daughter [name] and notified of same and she is agreeable to return to facility. Message left for [facility]- Admissions Director of [place] of possible d/c tomorrow.  CSW services will continue to monitor.  Lorie Phenix. Pauline Good, [place]

## 2014-04-15 DIAGNOSIS — I950 Idiopathic hypotension: Secondary | ICD-10-CM

## 2014-04-15 LAB — BASIC METABOLIC PANEL
ANION GAP: 3 — AB (ref 5–15)
BUN: 19 mg/dL (ref 6–23)
CALCIUM: 8.6 mg/dL (ref 8.4–10.5)
CO2: 27 mmol/L (ref 19–32)
Chloride: 109 mmol/L (ref 96–112)
Creatinine, Ser: 0.88 mg/dL (ref 0.50–1.35)
GFR calc Af Amer: >90 mL/min (ref >90)
GFR calc non Af Amer: 78 mL/min — ABNORMAL LOW (ref >90)
Glucose, Bld: 119 mg/dL — ABNORMAL HIGH (ref 70–99)
POTASSIUM: 4.1 mmol/L (ref 3.5–5.1)
Sodium: 139 mmol/L (ref 135–145)

## 2014-04-15 LAB — CBC
HCT: 26.6 % — ABNORMAL LOW (ref 39.0–52.0)
Hemoglobin: 8.5 g/dL — ABNORMAL LOW (ref 13.0–17.0)
MCH: 27.2 pg (ref 26.0–34.0)
MCHC: 32 g/dL (ref 30.0–36.0)
MCV: 85 fL (ref 78.0–100.0)
Platelets: 92 10*3/uL — ABNORMAL LOW (ref 150–400)
RBC: 3.13 MIL/uL — ABNORMAL LOW (ref 4.22–5.81)
RDW: 18.1 % — ABNORMAL HIGH (ref 11.5–15.5)
WBC: 4.4 10*3/uL (ref 4.0–10.5)

## 2014-04-15 LAB — GLUCOSE, CAPILLARY
GLUCOSE-CAPILLARY: 99 mg/dL (ref 70–99)
Glucose-Capillary: 114 mg/dL — ABNORMAL HIGH (ref 70–99)
Glucose-Capillary: 113 mg/dL — ABNORMAL HIGH (ref 70–99)

## 2014-04-15 MED ORDER — LORAZEPAM 2 MG/ML PO CONC: 0.2500 mg | Freq: Four times a day (QID) | ORAL | Status: DC | PRN

## 2014-04-15 MED ORDER — TRAMADOL HCL 50 MG PO TABS
50.0000 mg | ORAL_TABLET | Freq: Four times a day (QID) | ORAL | Status: DC | PRN
Start: 2014-04-15 — End: 2014-04-23

## 2014-04-15 NOTE — Care Management Note (Signed)
    Page 1 of 1   04/15/2014     3:03:15 PM CARE MANAGEMENT NOTE 04/15/2014  Patient:  [name], [name]   Account Number:  [id number]  Date Initiated:  04/15/2014  Documentation initiated by:  Marvetta Gibbons  Subjective/Objective Assessment:   Pt admitted with hypotension     Action/Plan:   PTA pt from [place] following for return to SNF   Anticipated DC Date:  04/15/2014   Anticipated DC Plan:  SKILLED NURSING FACILITY  In-house referral  Clinical Social Worker      DC Planning Services  CM consult      Choice offered to / List presented to:             Status of service:  Completed, signed off Medicare Important Message given?  YES (If response is "NO", the following Medicare IM given date fields will be blank) Date Medicare IM given:  04/15/2014 Medicare IM given by:  Marvetta Gibbons Date Additional Medicare IM given:   Additional Medicare IM given by:    Discharge Disposition:  [place]  Per UR Regulation:  Reviewed for med. necessity/level of care/duration of stay  If discussed at [place] of Stay Meetings, dates discussed:    Comments:

## 2014-04-15 NOTE — Discharge Summary (Signed)
Physician Discharge Summary  [name] ZOX:[id number] DOB: 05/15/1929 DOA: 04/09/2014  PCP: [name], MD  Admit date: 04/09/2014 Discharge date: 04/15/2014  Time spent: 70 minutes  Recommendations for Outpatient Follow-up:  1. Follow-up with M.D. at skilled nursing facility. 2. Follow-up with infectious disease as  previously scheduled.  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 male with Past medical history of prostate cancer, AAA repair with graft infection and chronic endovascular leak resulting in anemia, history of DVT with IVC filter, Pseudomonas bacteremia, C. difficile in the past, dysphagia status post PEG tube placement, hypertension. The patient presented with complaints of diarrhea. The symptoms started earlier on the morning of admission and he has 5 bowel movements while he was here in the ER. He is Hemoccult negative. Patient did have on and off episodes of diarrhea and was checked for C. difficile 2 weeks prior to admission, it was negative. Patient is on multiple antibiotics chronically for his chronically infected Pseudomonas endovascular stent and also recurrent hospitalization with various infections. With his diarrhea he was also noted to  have low blood pressure and therefore he was referred to ER. Patient complained of on and off chills, chronic cough with expectoration which is unchanged, with a rash on the left leg which was new. Pt denied any fever, headache, runny nose, neck pain, choking episodes, chest pain, palpitation, shortness of breath, orthopnea, PND, nausea, vomiting, abdominal pain, acid reflux, active bleeding, black color BM, burning urination, increase urinary frequency, fall, trauma or injury, dizziness, pedal edema, difficulty swallowing, focal neurological deficit. Patient also does of decreased appetite. No change in his tube feedings reported   The patient was coming from SNF. And at his baseline independent for most of his ADL.   Hospital Course:  #1 sepsis likely secondary to healthcare associated pneumonia Patient had presented with hypotension, tachycardia and elevated lactic acid level meeting criteria for sepsis with chest x-ray consistent with a underlying pneumonia. Likely a healthcare associated pneumonia. Patient was initially placed empirically on IV vancomycin IV meropenem. Patient improved clinically. Blood cultures with no growth to date. Patient's blood pressure improved on a daily basis. IV antibiotics were subsequently transitioned to oral Levaquin. Patient will continue on oral Levaquin as prior to his hospitalization indefinitely. Outpatient follow-up.   #2 healthcare associated pneumonia Patient was admitted with sepsis felt to be secondary to healthcare associated pneumonia. Blood cultures were drawn. Patient was placed initially empirically on IV meropenem and IV vancomycin. Patient improved clinically and symptomatically throughout the hospitalization. IV vancomycin was discontinued. IV meropenem was subsequently transitioned to oral Levaquin. Patient will remain on oral Levaquin as outpatient. Outpatient follow-up.   #3 bacteriuria Urine cultures negative.  #4 elevated  troponin Likely secondary to a demand ischemia. Repeat 2-D echo with EF of 35-40% with diffuse hypokinesis with no significant change from prior 2-D echo. Patient was asymptomatic. Troponins trended down. Admitting hospitalist discussed with cardiology fellow and recommended monitoring serial enzymes. Follow.  #5 Tachycardia vs SVT Secondary to #1. Resolved. Continued on home dose beta blocker.  #6 history of recent C. difficile Repeat C. difficile PCR is negative. Oral vancomycin was discontinued. Diarrhea improved.  #7 chronic kidney disease stage II Stable. Avoid nephrotoxins.  #8 thrombocytopenia Chronic in nature. Platelets stable. Follow. Aspirin and Plavix have been held.  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 -Last admission he was seen by palliative care, family wishes to continue with care.  -Finished meropenem and rifampin March 07 2014 for recurrent bacteriemia, -on levaquin as outpt. Discuss with ID and patient will be transitioned back to home regimen of Levaquin.   #10 hematuria in the setting of history of prostate cancer radiation therapy Patient previously on continuous bladder irrigation. Urology. Aspirin and Plavix were discontinued during last hospitalization. Will not resume at this time. Follow H&H and platelets.  #11 chronic dysphagia/failure to thrive/severe protein calorie malnutrition Continued on tube feeds.  #12 anemia of chronic disease/anemia due to chronic blood loss H&H stable.  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 2-D echo with EF of 35-40% with diffuse hypokinesis with no significant change from prior 2-D echo. Continued on home regimen of metoprolol.  #14 history of  lower extremity DVT (February 2015) status post IVC filter.   Procedures:  Chest x-ray 04/09/2014  2-D echo 04/10/2014  Consultations:  None  Discharge Exam: Filed Vitals:   04/15/14 0506  BP: 101/61  Pulse: 92  Temp: 97.9 F (36.6 C)  Resp: 18    General: NAD Cardiovascular: RRR Respiratory: CTAB  Discharge Instructions   Discharge Instructions    Diet - low sodium heart healthy    Complete by:  As directed      Discharge instructions    Complete by:  As directed   Follow up with MD at SNF.     Increase activity slowly    Complete by:  As directed           Current Discharge Medication List    CONTINUE these medications which have CHANGED   Details  LORazepam (ATIVAN) 2 MG/ML concentrated solution Take 0.1 mLs (0.2 mg total) by mouth every 6 (six) hours as needed for anxiety. Qty: 30 mL, Refills: 0    traMADol (ULTRAM) 50 MG tablet Place 1 tablet (50 mg total) into feeding tube every 6 (six) hours as needed for moderate pain. Qty: 20 tablet, Refills: 0      CONTINUE these medications which have NOT CHANGED   Details  acetaminophen (TYLENOL) 500 MG tablet Take 1,000 mg by mouth every 6 (six) hours as needed for mild pain or moderate pain.    albuterol (PROVENTIL) (2.5 MG/3ML) 0.083% nebulizer solution Take 3 mLs (2.5 mg total) by nebulization every 2 (two) hours as needed for wheezing or shortness of breath. Qty: 75 mL, Refills: 12    Amino Acids-Protein Hydrolys (FEEDING SUPPLEMENT, PRO-STAT SUGAR FREE 64,) LIQD Take 30 mLs by mouth 3 (three) times daily with meals. Qty: 900 mL, Refills: 0  doxazosin (CARDURA) 4 MG tablet Take 4 mg by mouth at bedtime.    famotidine (PEPCID) 40 MG/5ML suspension Place 5 mLs (40 mg total) into feeding tube daily. Qty: 50 mL, Refills: 0    lactulose (CHRONULAC) 10 GM/15ML solution Take 10 g by mouth daily. Every day per [facility]    levofloxacin (LEVAQUIN) 750 MG tablet Please resume oral levaquin when IV Meropenem is  complete on 12/25.  Continue indefintely for suppression of infection.  This medication will be managed by Infectious Disease at [facility].    methylphenidate (RITALIN) 5 MG tablet Take 1 tablet (5 mg total) by mouth daily. Qty: 30 tablet, Refills: 0    metoprolol succinate (TOPROL-XL) 25 MG 24 hr tablet Take 25 mg by mouth daily.    mirtazapine (REMERON) 30 MG tablet Take 30 mg by mouth at bedtime.    Nutritional Supplements (FEEDING SUPPLEMENT, JEVITY 1.2 CAL,) LIQD Place 1,000 mLs into feeding tube continuous. Refills: 0    rifampin (RIFADIN) 300 MG capsule Take 300 mg by mouth every 12 (twelve) hours.    simvastatin (ZOCOR) 20 MG tablet Take 20 mg by mouth daily.    tolterodine (DETROL LA) 4 MG 24 hr capsule Take 4 mg by mouth daily.    triamcinolone cream (KENALOG) 0.1 % Apply 1 application topically 2 (two) times daily as needed (rash).    Water For Irrigation, Sterile (FREE WATER) SOLN Place 100 mLs into feeding tube every 8 (eight) hours.      STOP taking these medications     vancomycin (VANCOCIN) 50 mg/mL oral solution        Allergies  Allergen Reactions  . Atorvastatin Other (See Comments)    Causes constipation  . Penicillins Other (See Comments)    Other reaction(s): RASH HIVES    Follow-up Information    Please follow up.   Why:  f/u with MD at SNF       The results of significant diagnostics from this hospitalization (including imaging, microbiology, ancillary and laboratory) are listed below for reference.    Significant Diagnostic Studies: Dg Chest Port 1 View  04/09/2014   CLINICAL DATA:  Altered mental status  EXAM: PORTABLE CHEST - 1 VIEW  COMPARISON:  Portable exam 1801 hr compared to 03/24/2014  FINDINGS: Atherosclerotic calcification aorta with prior aortic stenting.  Stable heart size and mediastinal contours.  Increased LEFT basilar atelectasis versus infiltrate.  Mild persistent opacity in RIGHT lower lobe little changed.  Upper lungs clear.  No  gross pleural effusion or pneumothorax.  IMPRESSION: Persistent RIGHT basilar opacity with increased atelectasis versus infiltrate in LEFT lower lobe.   Electronically Signed   By: Lavonia Dana M.D.   On: 04/09/2014 18:16   Dg Chest Port 1 View  03/24/2014   CLINICAL DATA:  Difficulty breathing  EXAM: PORTABLE CHEST - 1 VIEW  COMPARISON:  March 23, 2014  FINDINGS: Mild patchy opacity in the lingula persists. There is atelectatic change in the right base, slightly increased from 1 day prior. Lungs elsewhere clear. Heart is upper normal in size with pulmonary vascular within normal limits. Aortic stent graft present. No adenopathy.  IMPRESSION: Slight increase in right base atelectasis. Patchy infiltrate in lingular region, stable. Lungs elsewhere clear. No change in cardiac silhouette.   Electronically Signed   By: Lowella Grip M.D.   On: 03/24/2014 07:31   Dg Chest Port 1 View  03/23/2014   CLINICAL DATA:  Initial evaluation for fever of 103 degrees in decreased lungs  sounds  EXAM: PORTABLE CHEST - 1 VIEW  COMPARISON:  02/21/2014  FINDINGS: Cardiac enlargement stable. Descending thoracic aortic stent graft stable. Vascular pattern otherwise normal.  Mild patchy infiltrate in the lingula.  Right lung appears clear.  IMPRESSION: Lingular infiltrate concerning for pneumonia.   Electronically Signed   By: Skipper Cliche M.D.   On: 03/23/2014 15:28    Microbiology: Recent Results (from the past 240 hour(s))  Urine culture     Status: None   Collection Time: 04/09/14  6:18 PM  Result Value Ref Range Status   Specimen Description URINE, CLEAN CATCH  Final   Special Requests NONE  Final   Colony Count NO GROWTH Performed at [facility]   Final   Culture NO GROWTH Performed at [facility]   Final   Report Status 04/11/2014 FINAL  Final  Clostridium Difficile by PCR     Status: None   Collection Time: 04/09/14  6:18 PM  Result Value Ref Range Status   C difficile by pcr  NEGATIVE NEGATIVE Final  Stool culture     Status: None   Collection Time: 04/09/14  6:18 PM  Result Value Ref Range Status   Specimen Description STOOL  Final   Special Requests NONE  Final   Culture   Final    NO SALMONELLA, SHIGELLA, CAMPYLOBACTER, YERSINIA, OR E.COLI [id number] ISOLATED Note: REDUCED NORMAL FLORA PRESENT Performed at [facility]    Report Status 04/13/2014 FINAL  Final  Blood Culture (routine x 2)     Status: None (Preliminary result)   Collection Time: 04/09/14  7:18 PM  Result Value Ref Range Status   Specimen Description BLOOD ARM RIGHT  Final   Special Requests BOTTLES DRAWN AEROBIC AND ANAEROBIC 5CC  Final   Culture   Final           BLOOD CULTURE RECEIVED NO GROWTH TO DATE CULTURE WILL BE HELD FOR 5 DAYS BEFORE ISSUING A FINAL NEGATIVE REPORT Performed at [facility]    Report Status PENDING  Incomplete  Blood Culture (routine x 2)     Status: None (Preliminary result)   Collection Time: 04/09/14  8:23 PM  Result Value Ref Range Status   Specimen Description BLOOD ARM LEFT  Final   Special Requests BOTTLES DRAWN AEROBIC AND ANAEROBIC 5CC  Final   Culture   Final           BLOOD CULTURE RECEIVED NO GROWTH TO DATE CULTURE WILL BE HELD FOR 5 DAYS BEFORE ISSUING A FINAL NEGATIVE REPORT Performed at [facility]    Report Status PENDING  Incomplete  MRSA PCR Screening     Status: None   Collection Time: 04/10/14  2:05 AM  Result Value Ref Range Status   MRSA by PCR NEGATIVE NEGATIVE Final    Comment:        The GeneXpert MRSA Assay (FDA approved for NASAL specimens only), is one component of a comprehensive MRSA colonization surveillance program. It is not intended to diagnose MRSA infection nor to guide or monitor treatment for MRSA infections.      Labs: Basic Metabolic Panel:  Recent Labs Lab 04/11/14 0415 04/12/14 0430 04/13/14 0358 04/14/14 0250 04/15/14 0510  NA 140 143 140 140 139  K 4.0 4.0 3.5 3.7 4.1   CL 110 112 110 110 109  CO2 23 28 26 26 27   GLUCOSE 73 119* 102* 110* 119*  BUN 32* 27* 22 19 19   CREATININE  1.11 0.94 0.85 0.74 0.88  CALCIUM 8.6 8.7 8.5 8.4 8.6   Liver Function Tests:  Recent Labs Lab 04/10/14 0536  AST 24  ALT 11  ALKPHOS 48  BILITOT 0.5  PROT 5.9*  ALBUMIN 2.3*   No results for input(s): LIPASE, AMYLASE in the last 168 hours. No results for input(s): AMMONIA in the last 168 hours. CBC:  Recent Labs Lab 04/09/14 1639  04/11/14 0415 04/12/14 0430 04/13/14 0358 04/14/14 0250 04/15/14 0510  WBC 12.2*  < > 5.1 4.0 4.5 3.7* 4.4  NEUTROABS 9.8*  --   --   --   --   --   --   HGB 10.9*  < > 9.1* 9.6* 8.7* 8.4* 8.5*  HCT 35.2*  < > 28.5* 30.6* 27.2* 26.3* 26.6*  MCV 88.2  < > 86.4 87.4 84.7 85.1 85.0  PLT 103*  < > 74* 92* 82* 81* 92*  < > = values in this interval not displayed. Cardiac Enzymes:  Recent Labs Lab 04/10/14 0005 04/10/14 0536 04/10/14 1000  TROPONINI 0.90* 0.56* 0.40*   BNP: BNP (last 3 results) No results for input(s): PROBNP in the last 8760 hours. CBG:  Recent Labs Lab 04/14/14 1555 04/14/14 2004 04/14/14 2344 04/15/14 0428 04/15/14 0828  GLUCAP 100* 100* 113* 114* 113*       Signed:  THOMPSON,DANIEL MD [facility] 04/15/2014, 10:38 AM

## 2014-04-15 NOTE — Clinical Social Work Note (Signed)
Patient will discharge to [name] Anticipated discharge date:04/15/14 Family notified:[facility] [employer] by Crystal signing off.  Domenica Reamer, [place] Social Worker [phone]

## 2014-04-15 NOTE — Progress Notes (Signed)
Medicare Important Message given? YES  (If response is "NO", the following Medicare IM given date fields will be blank)  Date Medicare IM given: 04/15/14 Medicare IM given by:  Mekaila Tarnow  

## 2014-04-16 LAB — CULTURE, BLOOD (ROUTINE X 2)
CULTURE: NO GROWTH
Culture: NO GROWTH

## 2014-04-17 ENCOUNTER — Emergency Department (HOSPITAL_COMMUNITY): Payer: Medicare Other

## 2014-04-17 ENCOUNTER — Encounter (HOSPITAL_COMMUNITY): Payer: Self-pay | Admitting: Emergency Medicine

## 2014-04-17 ENCOUNTER — Inpatient Hospital Stay (HOSPITAL_COMMUNITY)
Admission: EM | Admit: 2014-04-17 | Discharge: 2014-04-23 | DRG: 871 | Disposition: A | Discharge status: Skilled Nursing Facility | Payer: Medicare Other | Attending: Internal Medicine | Admitting: Internal Medicine

## 2014-04-17 DIAGNOSIS — Z792 Long term (current) use of antibiotics: Secondary | ICD-10-CM | POA: Diagnosis not present

## 2014-04-17 DIAGNOSIS — I129 Hypertensive chronic kidney disease with stage 1 through stage 4 chronic kidney disease, or unspecified chronic kidney disease: Secondary | ICD-10-CM | POA: Diagnosis present

## 2014-04-17 DIAGNOSIS — Z931 Gastrostomy status: Secondary | ICD-10-CM

## 2014-04-17 DIAGNOSIS — I330 Acute and subacute infective endocarditis: Secondary | ICD-10-CM | POA: Diagnosis present

## 2014-04-17 DIAGNOSIS — Z96652 Presence of left artificial knee joint: Secondary | ICD-10-CM | POA: Diagnosis present

## 2014-04-17 DIAGNOSIS — B964 Proteus (mirabilis) (morganii) as the cause of diseases classified elsewhere: Secondary | ICD-10-CM | POA: Diagnosis present

## 2014-04-17 DIAGNOSIS — Z87891 Personal history of nicotine dependence: Secondary | ICD-10-CM | POA: Diagnosis not present

## 2014-04-17 DIAGNOSIS — I4892 Unspecified atrial flutter: Secondary | ICD-10-CM | POA: Diagnosis present

## 2014-04-17 DIAGNOSIS — R4702 Dysphasia: Secondary | ICD-10-CM | POA: Diagnosis present

## 2014-04-17 DIAGNOSIS — Z79899 Other long term (current) drug therapy: Secondary | ICD-10-CM

## 2014-04-17 DIAGNOSIS — R1310 Dysphagia, unspecified: Secondary | ICD-10-CM | POA: Diagnosis present

## 2014-04-17 DIAGNOSIS — R6521 Severe sepsis with septic shock: Secondary | ICD-10-CM | POA: Diagnosis present

## 2014-04-17 DIAGNOSIS — I358 Other nonrheumatic aortic valve disorders: Secondary | ICD-10-CM | POA: Diagnosis present

## 2014-04-17 DIAGNOSIS — E860 Dehydration: Secondary | ICD-10-CM | POA: Diagnosis present

## 2014-04-17 DIAGNOSIS — R509 Fever, unspecified: Secondary | ICD-10-CM | POA: Insufficient documentation

## 2014-04-17 DIAGNOSIS — I714 Abdominal aortic aneurysm, without rupture: Secondary | ICD-10-CM | POA: Diagnosis present

## 2014-04-17 DIAGNOSIS — I480 Paroxysmal atrial fibrillation: Secondary | ICD-10-CM | POA: Diagnosis present

## 2014-04-17 DIAGNOSIS — E785 Hyperlipidemia, unspecified: Secondary | ICD-10-CM | POA: Diagnosis present

## 2014-04-17 DIAGNOSIS — E43 Unspecified severe protein-calorie malnutrition: Secondary | ICD-10-CM | POA: Diagnosis present

## 2014-04-17 DIAGNOSIS — R918 Other nonspecific abnormal finding of lung field: Secondary | ICD-10-CM

## 2014-04-17 DIAGNOSIS — E876 Hypokalemia: Secondary | ICD-10-CM | POA: Diagnosis present

## 2014-04-17 DIAGNOSIS — Z86718 Personal history of other venous thrombosis and embolism: Secondary | ICD-10-CM

## 2014-04-17 DIAGNOSIS — I729 Aneurysm of unspecified site: Secondary | ICD-10-CM | POA: Insufficient documentation

## 2014-04-17 DIAGNOSIS — A419 Sepsis, unspecified organism: Principal | ICD-10-CM | POA: Diagnosis present

## 2014-04-17 DIAGNOSIS — Z8546 Personal history of malignant neoplasm of prostate: Secondary | ICD-10-CM | POA: Diagnosis not present

## 2014-04-17 DIAGNOSIS — R6520 Severe sepsis without septic shock: Secondary | ICD-10-CM

## 2014-04-17 DIAGNOSIS — B965 Pseudomonas (aeruginosa) (mallei) (pseudomallei) as the cause of diseases classified elsewhere: Secondary | ICD-10-CM

## 2014-04-17 DIAGNOSIS — R7881 Bacteremia: Secondary | ICD-10-CM | POA: Diagnosis present

## 2014-04-17 DIAGNOSIS — N400 Enlarged prostate without lower urinary tract symptoms: Secondary | ICD-10-CM | POA: Diagnosis present

## 2014-04-17 DIAGNOSIS — I429 Cardiomyopathy, unspecified: Secondary | ICD-10-CM | POA: Diagnosis present

## 2014-04-17 DIAGNOSIS — D696 Thrombocytopenia, unspecified: Secondary | ICD-10-CM | POA: Diagnosis present

## 2014-04-17 DIAGNOSIS — I5042 Chronic combined systolic (congestive) and diastolic (congestive) heart failure: Secondary | ICD-10-CM | POA: Diagnosis present

## 2014-04-17 DIAGNOSIS — N183 Chronic kidney disease, stage 3 unspecified: Secondary | ICD-10-CM | POA: Diagnosis present

## 2014-04-17 DIAGNOSIS — Z801 Family history of malignant neoplasm of trachea, bronchus and lung: Secondary | ICD-10-CM

## 2014-04-17 DIAGNOSIS — I82402 Acute embolism and thrombosis of unspecified deep veins of left lower extremity: Secondary | ICD-10-CM | POA: Diagnosis present

## 2014-04-17 DIAGNOSIS — A047 Enterocolitis due to Clostridium difficile: Secondary | ICD-10-CM

## 2014-04-17 DIAGNOSIS — R9431 Abnormal electrocardiogram [ECG] [EKG]: Secondary | ICD-10-CM | POA: Insufficient documentation

## 2014-04-17 DIAGNOSIS — Z923 Personal history of irradiation: Secondary | ICD-10-CM

## 2014-04-17 DIAGNOSIS — R739 Hyperglycemia, unspecified: Secondary | ICD-10-CM | POA: Diagnosis present

## 2014-04-17 DIAGNOSIS — A498 Other bacterial infections of unspecified site: Secondary | ICD-10-CM | POA: Insufficient documentation

## 2014-04-17 DIAGNOSIS — M179 Osteoarthritis of knee, unspecified: Secondary | ICD-10-CM

## 2014-04-17 DIAGNOSIS — M1711 Unilateral primary osteoarthritis, right knee: Secondary | ICD-10-CM | POA: Diagnosis present

## 2014-04-17 DIAGNOSIS — I359 Nonrheumatic aortic valve disorder, unspecified: Secondary | ICD-10-CM | POA: Diagnosis present

## 2014-04-17 DIAGNOSIS — Z8619 Personal history of other infectious and parasitic diseases: Secondary | ICD-10-CM | POA: Insufficient documentation

## 2014-04-17 HISTORY — DX: Acute embolism and thrombosis of unspecified deep veins of unspecified lower extremity: I82.409

## 2014-04-17 LAB — COMPREHENSIVE METABOLIC PANEL
ALK PHOS: 58 U/L (ref 39–117)
ALT: 8 U/L (ref 0–53)
AST: 24 U/L (ref 0–37)
Albumin: 2.6 g/dL — ABNORMAL LOW (ref 3.5–5.2)
Anion gap: 8 (ref 5–15)
BUN: 21 mg/dL (ref 6–23)
CO2: 24 mmol/L (ref 19–32)
CREATININE: 0.95 mg/dL (ref 0.50–1.35)
Calcium: 8.9 mg/dL (ref 8.4–10.5)
Chloride: 104 mmol/L (ref 96–112)
GFR calc Af Amer: 87 mL/min — ABNORMAL LOW (ref >90)
GFR calc non Af Amer: 75 mL/min — ABNORMAL LOW (ref >90)
GLUCOSE: 107 mg/dL — AB (ref 70–99)
Potassium: 3.6 mmol/L (ref 3.5–5.1)
Sodium: 136 mmol/L (ref 135–145)
TOTAL PROTEIN: 6.2 g/dL (ref 6.0–8.3)
Total Bilirubin: 0.7 mg/dL (ref 0.3–1.2)

## 2014-04-17 LAB — CBC WITH DIFFERENTIAL/PLATELET
BASOS ABS: 0 10*3/uL (ref 0.0–0.1)
Basophils Relative: 0 % (ref 0–1)
Eosinophils Absolute: 0.1 10*3/uL (ref 0.0–0.7)
Eosinophils Relative: 1 % (ref 0–5)
HCT: 25.4 % — ABNORMAL LOW (ref 39.0–52.0)
Hemoglobin: 8.2 g/dL — ABNORMAL LOW (ref 13.0–17.0)
LYMPHS PCT: 14 % (ref 12–46)
Lymphs Abs: 0.6 10*3/uL — ABNORMAL LOW (ref 0.7–4.0)
MCH: 27 pg (ref 26.0–34.0)
MCHC: 32.3 g/dL (ref 30.0–36.0)
MCV: 83.6 fL (ref 78.0–100.0)
Monocytes Absolute: 0.5 10*3/uL (ref 0.1–1.0)
Monocytes Relative: 11 % (ref 3–12)
NEUTROS ABS: 3 10*3/uL (ref 1.7–7.7)
Neutrophils Relative %: 74 % (ref 43–77)
PLATELETS: 94 10*3/uL — AB (ref 150–400)
RBC: 3.04 MIL/uL — ABNORMAL LOW (ref 4.22–5.81)
RDW: 18 % — ABNORMAL HIGH (ref 11.5–15.5)
WBC: 4 10*3/uL (ref 4.0–10.5)

## 2014-04-17 LAB — URINALYSIS, ROUTINE W REFLEX MICROSCOPIC
Bilirubin Urine: NEGATIVE
GLUCOSE, UA: NEGATIVE mg/dL
Hgb urine dipstick: NEGATIVE
KETONES UR: NEGATIVE mg/dL
Nitrite: NEGATIVE
Protein, ur: NEGATIVE mg/dL
SPECIFIC GRAVITY, URINE: 1.02 (ref 1.005–1.030)
Urobilinogen, UA: 0.2 mg/dL (ref 0.0–1.0)
pH: 5.5 (ref 5.0–8.0)

## 2014-04-17 LAB — I-STAT CG4 LACTIC ACID, ED: Lactic Acid, Venous: 1.58 mmol/L (ref 0.5–2.0)

## 2014-04-17 LAB — URINE MICROSCOPIC-ADD ON

## 2014-04-17 LAB — EXPECTORATED SPUTUM ASSESSMENT W GRAM STAIN, RFLX TO RESP C

## 2014-04-17 LAB — GLUCOSE, CAPILLARY
Glucose-Capillary: 89 mg/dL (ref 70–99)
Glucose-Capillary: 128 mg/dL — ABNORMAL HIGH (ref 70–99)
Glucose-Capillary: 65 mg/dL — ABNORMAL LOW (ref 70–99)
Glucose-Capillary: 77 mg/dL (ref 70–99)

## 2014-04-17 LAB — EXPECTORATED SPUTUM ASSESSMENT W REFEX TO RESP CULTURE

## 2014-04-17 LAB — PROTIME-INR
INR: 1.38 (ref 0.00–1.49)
Prothrombin Time: 17.1 seconds — ABNORMAL HIGH (ref 11.6–15.2)

## 2014-04-17 LAB — FIBRINOGEN: Fibrinogen: 190 mg/dL — ABNORMAL LOW (ref 204–475)

## 2014-04-17 LAB — TYPE AND SCREEN
ABO/RH(D): O POS
Antibody Screen: NEGATIVE

## 2014-04-17 LAB — LACTIC ACID, PLASMA
LACTIC ACID, VENOUS: 1.1 mmol/L (ref 0.5–2.0)
Lactic Acid, Venous: 0.8 mmol/L (ref 0.5–2.0)

## 2014-04-17 LAB — APTT: aPTT: 36 seconds (ref 24–37)

## 2014-04-17 LAB — TROPONIN I: Troponin I: 0.03 ng/mL (ref <0.031)

## 2014-04-17 MED ORDER — LACTULOSE 10 GM/15ML PO SOLN
10.0000 g | Freq: Every day | ORAL | Status: DC
  Administered 2014-04-17 – 2014-04-18 (×2): 10 g via ORAL
  Filled 2014-04-17 (×4): qty 15

## 2014-04-17 MED ORDER — SODIUM CHLORIDE 0.9 % IV SOLN: INTRAVENOUS | Status: DC

## 2014-04-17 MED ORDER — HEPARIN SODIUM (PORCINE) 5000 UNIT/ML IJ SOLN: 5000.0000 [IU] | Freq: Three times a day (TID) | INTRAMUSCULAR | Status: DC

## 2014-04-17 MED ORDER — SODIUM CHLORIDE 0.9 % IV BOLUS (SEPSIS): 1000.0000 mL | INTRAVENOUS | Status: DC | PRN

## 2014-04-17 MED ORDER — DEXTROSE 50 % IV SOLN
INTRAVENOUS | Status: AC
Start: 2014-04-17 — End: 2014-04-17
  Administered 2014-04-17: 25 g via INTRAVENOUS
  Filled 2014-04-17: qty 50

## 2014-04-17 MED ORDER — HEPARIN SODIUM (PORCINE) 5000 UNIT/ML IJ SOLN
5000.0000 [IU] | Freq: Three times a day (TID) | INTRAMUSCULAR | Status: DC
  Administered 2014-04-17 – 2014-04-23 (×15): 5000 [IU] via SUBCUTANEOUS
  Filled 2014-04-17 (×23): qty 1

## 2014-04-17 MED ORDER — GUAIFENESIN-DM 100-10 MG/5ML PO SYRP
5.0000 mL | ORAL_SOLUTION | ORAL | Status: DC | PRN
  Filled 2014-04-17: qty 5

## 2014-04-17 MED ORDER — SODIUM CHLORIDE 0.9 % IV BOLUS (SEPSIS)
1000.0000 mL | INTRAVENOUS | Status: AC
  Administered 2014-04-17 (×3): 1000 mL via INTRAVENOUS

## 2014-04-17 MED ORDER — AZTREONAM 2 G IJ SOLR
2.0000 g | Freq: Once | INTRAMUSCULAR | Status: AC
  Administered 2014-04-17: 2 g via INTRAVENOUS
  Filled 2014-04-17: qty 2

## 2014-04-17 MED ORDER — PRO-STAT SUGAR FREE PO LIQD
30.0000 mL | Freq: Three times a day (TID) | ORAL | Status: DC
  Filled 2014-04-17 (×3): qty 30

## 2014-04-17 MED ORDER — PRO-STAT SUGAR FREE PO LIQD
30.0000 mL | Freq: Two times a day (BID) | ORAL | Status: DC
  Filled 2014-04-17: qty 30

## 2014-04-17 MED ORDER — POLYETHYLENE GLYCOL 3350 17 G PO PACK
17.0000 g | PACK | Freq: Every day | ORAL | Status: DC | PRN
  Filled 2014-04-17: qty 1

## 2014-04-17 MED ORDER — ONDANSETRON HCL 4 MG/2ML IJ SOLN: 4.0000 mg | Freq: Four times a day (QID) | INTRAMUSCULAR | Status: DC | PRN

## 2014-04-17 MED ORDER — LEVOFLOXACIN IN D5W 750 MG/150ML IV SOLN
750.0000 mg | Freq: Once | INTRAVENOUS | Status: AC
  Administered 2014-04-17: 750 mg via INTRAVENOUS
  Filled 2014-04-17: qty 150

## 2014-04-17 MED ORDER — TRAMADOL HCL 50 MG PO TABS: 50.0000 mg | ORAL_TABLET | Freq: Four times a day (QID) | ORAL | Status: DC | PRN

## 2014-04-17 MED ORDER — BISACODYL 10 MG RE SUPP: 10.0000 mg | RECTAL | Status: DC | PRN

## 2014-04-17 MED ORDER — VITAL HIGH PROTEIN PO LIQD
1000.0000 mL | ORAL | Status: DC
  Filled 2014-04-17 (×2): qty 1000

## 2014-04-17 MED ORDER — FREE WATER
100.0000 mL | Freq: Three times a day (TID) | Status: DC
  Administered 2014-04-17 – 2014-04-19 (×6): 100 mL

## 2014-04-17 MED ORDER — SODIUM CHLORIDE 0.9 % IV BOLUS (SEPSIS)
1000.0000 mL | Freq: Once | INTRAVENOUS | Status: AC
  Administered 2014-04-17: 1000 mL via INTRAVENOUS

## 2014-04-17 MED ORDER — METHYLPHENIDATE HCL 5 MG PO TABS
5.0000 mg | ORAL_TABLET | Freq: Every day | ORAL | Status: DC
  Administered 2014-04-18 – 2014-04-19 (×2): 5 mg via ORAL
  Filled 2014-04-17 (×2): qty 1

## 2014-04-17 MED ORDER — JEVITY 1.2 CAL PO LIQD
1000.0000 mL | ORAL | Status: DC
  Filled 2014-04-17 (×2): qty 1000

## 2014-04-17 MED ORDER — SODIUM CHLORIDE 0.9 % IV SOLN: 250.0000 mL | INTRAVENOUS | Status: DC | PRN

## 2014-04-17 MED ORDER — PRO-STAT SUGAR FREE PO LIQD
30.0000 mL | Freq: Every day | ORAL | Status: DC
  Administered 2014-04-17 – 2014-04-18 (×2): 30 mL
  Filled 2014-04-17 (×3): qty 30

## 2014-04-17 MED ORDER — METOPROLOL TARTRATE 1 MG/ML IV SOLN: 5.0000 mg | INTRAVENOUS | Status: DC | PRN

## 2014-04-17 MED ORDER — JEVITY 1.2 CAL PO LIQD
1000.0000 mL | ORAL | Status: DC
  Administered 2014-04-17 – 2014-04-19 (×3): 1000 mL
  Filled 2014-04-17 (×8): qty 1000
  Filled 2014-04-17: qty 237
  Filled 2014-04-17: qty 1000

## 2014-04-17 MED ORDER — ACETAMINOPHEN 325 MG PO TABS: 650.0000 mg | ORAL_TABLET | ORAL | Status: DC | PRN

## 2014-04-17 MED ORDER — ONDANSETRON HCL 4 MG PO TABS: 4.0000 mg | ORAL_TABLET | Freq: Four times a day (QID) | ORAL | Status: DC | PRN

## 2014-04-17 MED ORDER — ASPIRIN 81 MG PO CHEW: 324.0000 mg | CHEWABLE_TABLET | ORAL | Status: AC

## 2014-04-17 MED ORDER — MAGNESIUM HYDROXIDE 400 MG/5ML PO SUSP: 30.0000 mL | Freq: Every day | ORAL | Status: DC | PRN

## 2014-04-17 MED ORDER — JEVITY 1.2 CAL PO LIQD
1000.0000 mL | ORAL | Status: DC
  Filled 2014-04-17 (×3): qty 1000

## 2014-04-17 MED ORDER — SODIUM CHLORIDE 0.9 % IV SOLN
1.0000 g | Freq: Three times a day (TID) | INTRAVENOUS | Status: DC
  Administered 2014-04-17 – 2014-04-22 (×15): 1 g via INTRAVENOUS
  Filled 2014-04-17 (×18): qty 1

## 2014-04-17 MED ORDER — ALBUTEROL SULFATE (2.5 MG/3ML) 0.083% IN NEBU: 2.5000 mg | INHALATION_SOLUTION | RESPIRATORY_TRACT | Status: DC | PRN

## 2014-04-17 MED ORDER — SODIUM CHLORIDE 0.9 % IJ SOLN
3.0000 mL | Freq: Two times a day (BID) | INTRAMUSCULAR | Status: DC
  Administered 2014-04-17 – 2014-04-23 (×7): 3 mL via INTRAVENOUS

## 2014-04-17 MED ORDER — VANCOMYCIN HCL IN DEXTROSE 1-5 GM/200ML-% IV SOLN
1000.0000 mg | Freq: Once | INTRAVENOUS | Status: AC
  Administered 2014-04-17: 1000 mg via INTRAVENOUS
  Filled 2014-04-17: qty 200

## 2014-04-17 MED ORDER — ACETAMINOPHEN 500 MG PO TABS
1000.0000 mg | ORAL_TABLET | Freq: Once | ORAL | Status: AC
  Administered 2014-04-17: 1000 mg via ORAL
  Filled 2014-04-17: qty 2

## 2014-04-17 MED ORDER — FESOTERODINE FUMARATE ER 4 MG PO TB24
4.0000 mg | ORAL_TABLET | Freq: Every day | ORAL | Status: DC
  Administered 2014-04-17 – 2014-04-23 (×6): 4 mg via ORAL
  Filled 2014-04-17 (×8): qty 1

## 2014-04-17 MED ORDER — HYDROCODONE-ACETAMINOPHEN 5-325 MG PO TABS: 1.0000 | ORAL_TABLET | ORAL | Status: DC | PRN

## 2014-04-17 MED ORDER — DOXAZOSIN MESYLATE 4 MG PO TABS
4.0000 mg | ORAL_TABLET | Freq: Every day | ORAL | Status: DC
  Administered 2014-04-17 – 2014-04-18 (×2): 4 mg via ORAL
  Filled 2014-04-17 (×3): qty 1

## 2014-04-17 MED ORDER — SODIUM CHLORIDE 0.9 % IV BOLUS (SEPSIS): 500.0000 mL | INTRAVENOUS | Status: DC | PRN

## 2014-04-17 MED ORDER — FAMOTIDINE 40 MG/5ML PO SUSR
40.0000 mg | Freq: Every day | ORAL | Status: DC
  Administered 2014-04-17 – 2014-04-18 (×2): 40 mg
  Filled 2014-04-17 (×3): qty 5

## 2014-04-17 MED ORDER — MIRTAZAPINE 30 MG PO TABS
30.0000 mg | ORAL_TABLET | Freq: Every day | ORAL | Status: DC
  Administered 2014-04-17 – 2014-04-18 (×2): 30 mg via ORAL
  Filled 2014-04-17 (×3): qty 1

## 2014-04-17 MED ORDER — VANCOMYCIN HCL IN DEXTROSE 750-5 MG/150ML-% IV SOLN
750.0000 mg | Freq: Two times a day (BID) | INTRAVENOUS | Status: DC
  Administered 2014-04-17 – 2014-04-19 (×4): 750 mg via INTRAVENOUS
  Filled 2014-04-17 (×5): qty 150

## 2014-04-17 MED ORDER — ASPIRIN 300 MG RE SUPP: 300.0000 mg | RECTAL | Status: AC

## 2014-04-17 MED ORDER — SODIUM CHLORIDE 0.9 % IV SOLN
INTRAVENOUS | Status: DC
  Administered 2014-04-17 – 2014-04-19 (×4): via INTRAVENOUS

## 2014-04-17 MED ORDER — DEXTROSE 50 % IV SOLN
25.0000 g | Freq: Once | INTRAVENOUS | Status: AC
Start: 2014-04-17 — End: 2014-04-17
  Administered 2014-04-17: 25 g via INTRAVENOUS

## 2014-04-17 MED ORDER — SIMVASTATIN 20 MG PO TABS
20.0000 mg | ORAL_TABLET | Freq: Every day | ORAL | Status: DC
  Administered 2014-04-17 – 2014-04-18 (×2): 20 mg via ORAL
  Filled 2014-04-17 (×3): qty 1

## 2014-04-17 MED ORDER — LORAZEPAM 2 MG/ML PO CONC
0.2500 mg | Freq: Four times a day (QID) | ORAL | Status: DC | PRN
Start: 2014-04-17 — End: 2014-04-17

## 2014-04-17 NOTE — ED Provider Notes (Signed)
CSN: [id number]     Arrival date & time 04/17/14  0336 History  This chart was scribed for Kalman Drape, MD by Delphia Grates, ED Scribe. This patient was seen in room B18C/B18C and the patient's care was started at 3:51 AM.   Chief Complaint  Patient presents with  . Fever     The history is provided by the patient. No language interpreter was used.     HPI Comments: [name] is a 81 y.o. male, with history of HTN, HLD, AAA, and infected aortic graft, brought in by ambulance, who presents to the Emergency Department complaining of fever (triage temp 100 F) onset PTA. Patient was sent here from the nursing home with an oral temperature of 101 F. He was given OxyIR with repeat temperature of 100.6 F after taking the medication per report. There is associated cough that has been ongoing for the past 3 months. Patient states he feels fine at this time, and denies any pain, nausea, vomiting, diarrhea, or palpitations. Patient was seen here, 1 week ago, for sepsis and discharged 2 days ago.   Past Medical History  Diagnosis Date  . Hypertension   . BPH (benign prostatic hyperplasia)   . Hyperlipidemia   . Cancer 2008-2009    prostate TREATED WITH RADIATION  . AAA (abdominal aortic aneurysm) 04/2012    STENTING OF AAA IN [place]  . Junctional cardiac arrhythmia     Occurred postoperatively after urologic surgery  . Severe sinus bradycardia     Occurred postoperatively after urologic surgery  . Aortic valve disorders   . Other primary cardiomyopathies   . Dysphagia   . Status post insertion of percutaneous endoscopic gastrostomy (PEG) tube 02/11/14  . Bacteremia due to Pseudomonas   . Infected aortic graft    Past Surgical History  Procedure Laterality Date  . Abdominal surgery      PART OF COLON REMOVED FOR DIVERTICULITIS  . Appendectomy    . Hernia repair    . Bladder surgery  2008    FOR BLADDER STONE  . Total knee arthroplasty  1990    left  . Abdominal aortic  aneurysm repair  04/2012  . Total knee arthroplasty Right 07/26/2012    Procedure: RIGHT TOTAL KNEE ARTHROPLASTY;  Surgeon: Tobi Bastos, MD;  Location: [facility];  Service: Orthopedics;  Laterality: Right;  . Nephrolithotomy Left 04/26/2013    Procedure: LEFT PERCUTANEOUS NEPHROLITHOTOMY ;  Surgeon: [name], MD;  Location: [facility];  Service: Urology;  Laterality: Left;  . Nephrolithotomy Left 05/03/2013    Procedure: 2ND STAGE LEFT PERCUTANEOUS NEPHROLITHOTOMY ;  Surgeon: [name], MD;  Location: [facility];  Service: Urology;  Laterality: Left;  . Esophagogastroduodenoscopy N/A 02/11/2014    VHQ:IONGE hiatal hernia. Patchy erythema of the gastric mucosa; otherwise negative EGD. Status post 40 French Microvasive PEG tube placement  . Peg placement N/A 02/11/2014    Procedure: PERCUTANEOUS ENDOSCOPIC GASTROSTOMY (PEG) PLACEMENT;  Surgeon: Daneil Dolin, MD;  Location: [facility];  Service: Endoscopy;  Laterality: N/A;   Family History  Problem Relation Age of Onset  . Lung cancer Father    History  Substance Use Topics  . Smoking status: Former Smoker -- 2.00 packs/day for 37 years    Types: Cigarettes    Start date: 02/17/1949    Quit date: 03/15/1985  . Smokeless tobacco: Former Systems developer     Comment: quit 25 years ago  . Alcohol Use: No  Review of Systems  Constitutional: Positive for fever and chills.  Respiratory: Positive for cough.   Cardiovascular: Negative for chest pain and palpitations.  Gastrointestinal: Negative for nausea, vomiting, abdominal pain and diarrhea.      Allergies  Atorvastatin and Penicillins  Home Medications   Prior to Admission medications   Medication Sig Start Date End Date Taking? Authorizing Provider  acetaminophen (TYLENOL) 500 MG tablet Take 1,000 mg by mouth every 6 (six) hours as needed for mild pain or moderate pain.    Historical Provider, MD  albuterol (PROVENTIL) (2.5 MG/3ML) 0.083% nebulizer solution Take 3 mLs (2.5 mg total) by  nebulization every 2 (two) hours as needed for wheezing or shortness of breath. 02/28/14   Bobby Rumpf York, PA-C  Amino Acids-Protein Hydrolys (FEEDING SUPPLEMENT, PRO-STAT SUGAR FREE 64,) LIQD Take 30 mLs by mouth 3 (three) times daily with meals. 03/27/14   Maryann Mikhail, DO  doxazosin (CARDURA) 4 MG tablet Take 4 mg by mouth at bedtime.    Historical Provider, MD  famotidine (PEPCID) 40 MG/5ML suspension Place 5 mLs (40 mg total) into feeding tube daily. 03/01/14   Bobby Rumpf York, PA-C  lactulose (CHRONULAC) 10 GM/15ML solution Take 10 g by mouth daily. Every day per [facility]    Historical Provider, MD  levofloxacin (LEVAQUIN) 750 MG tablet Please resume oral levaquin when IV Meropenem is complete on 12/25.  Continue indefintely for suppression of infection.  This medication will be managed by Infectious Disease at [facility]. Patient taking differently: Take 750 mg by mouth daily. Please resume oral levaquin when IV Meropenem is complete on 12/25.  Continue indefintely for suppression of infection.  This medication will be managed by Infectious Disease at [facility]. 02/28/14   Melton Alar, PA-C  LORazepam (ATIVAN) 2 MG/ML concentrated solution Take 0.1 mLs (0.2 mg total) by mouth every 6 (six) hours as needed for anxiety. 04/15/14   Eugenie Filler, MD  methylphenidate (RITALIN) 5 MG tablet Take 1 tablet (5 mg total) by mouth daily. 04/08/14   Tiffany L Reed, DO  metoprolol succinate (TOPROL-XL) 25 MG 24 hr tablet Take 25 mg by mouth daily.    Historical Provider, MD  mirtazapine (REMERON) 30 MG tablet Take 30 mg by mouth at bedtime.    Historical Provider, MD  Nutritional Supplements (FEEDING SUPPLEMENT, JEVITY 1.2 CAL,) LIQD Place 1,000 mLs into feeding tube continuous. 02/13/14   Erline Hau, MD  rifampin (RIFADIN) 300 MG capsule Take 300 mg by mouth every 12 (twelve) hours.    Historical Provider, MD  simvastatin (ZOCOR) 20 MG tablet Take 20 mg by mouth daily.    Historical Provider, MD   tolterodine (DETROL LA) 4 MG 24 hr capsule Take 4 mg by mouth daily.    Historical Provider, MD  traMADol (ULTRAM) 50 MG tablet Place 1 tablet (50 mg total) into feeding tube every 6 (six) hours as needed for moderate pain. 04/15/14   Eugenie Filler, MD  triamcinolone cream (KENALOG) 0.1 % Apply 1 application topically 2 (two) times daily as needed (rash).    Historical Provider, MD  Water For Irrigation, Sterile (FREE WATER) SOLN Place 100 mLs into feeding tube every 8 (eight) hours. 02/28/14   Melton Alar, PA-C   Triage Vitals: BP 91/62 mmHg  Pulse 117  Temp(Src) 100 F (37.8 C) (Oral)  Resp 20  Ht 6\' 2"  (1.88 m)  Wt 210 lb (95.255 kg)  BMI 26.95 kg/m2  SpO2 99%  Physical Exam  Constitutional: He is oriented to person, place, and time. He appears well-developed and well-nourished. No distress.  HENT:  Head: Normocephalic and atraumatic.  Nose: Nose normal.  Mouth/Throat: Oropharynx is clear and moist.  Eyes: Conjunctivae and EOM are normal.  Neck: Neck supple. No tracheal deviation present.  Cardiovascular: Regular rhythm, normal heart sounds and intact distal pulses.  Tachycardia present.   Pulmonary/Chest: Effort normal and breath sounds normal. No respiratory distress. He has no wheezes. He has no rales. He exhibits no tenderness.  Abdominal: Soft. Bowel sounds are normal. He exhibits no distension and no mass. There is no tenderness. There is no rebound and no guarding.  Musculoskeletal: Normal range of motion. He exhibits no edema or tenderness.  Neurological: He is alert and oriented to person, place, and time.  Skin: Skin is warm and dry.  Psychiatric: He has a normal mood and affect. His behavior is normal.  Nursing note and vitals reviewed.   ED Course  Procedures (including critical care time)  DIAGNOSTIC STUDIES: Oxygen Saturation is 99% on room air, normal by my interpretation.     COORDINATION OF CARE: At 64 Discussed treatment plan with patient.  Patient agrees.   Labs Review Labs Reviewed  COMPREHENSIVE METABOLIC PANEL - Abnormal; Notable for the following:    Glucose, Bld 107 (*)    Albumin 2.6 (*)    GFR calc non Af Amer 75 (*)    GFR calc Af Amer 87 (*)    All other components within normal limits  URINALYSIS, ROUTINE W REFLEX MICROSCOPIC - Abnormal; Notable for the following:    Color, Urine ORANGE (*)    Leukocytes, UA TRACE (*)    All other components within normal limits  CULTURE, BLOOD (ROUTINE X 2)  CULTURE, BLOOD (ROUTINE X 2)  URINE CULTURE  URINE MICROSCOPIC-ADD ON  CBC WITH DIFFERENTIAL/PLATELET  CBC WITH DIFFERENTIAL/PLATELET  CBC  I-STAT CG4 LACTIC ACID, ED    Imaging Review Dg Chest Port 1 View  04/17/2014   CLINICAL DATA:  Fever.  EXAM: PORTABLE CHEST - 1 VIEW  COMPARISON:  04/09/2014  FINDINGS: Cardiomediastinal contours with aortic stent graft in place, unchanged from prior exam. Mildly improved right basilar opacity with residual atelectasis. Unchanged atelectasis at the left lung base. No large pleural effusion or pneumothorax. Osseous structures are stable.  IMPRESSION: Improved right basilar aeration. No new consolidation to suggest progressive or new pneumonia.   Electronically Signed   By: Jeb Levering M.D.   On: 04/17/2014 04:15     EKG Interpretation   Date/Time:  Wednesday April 17 2014 03:52:39 EST Ventricular Rate:  117 PR Interval:    QRS Duration: 103 QT Interval:  394 QTC Calculation: 550 R Axis:   26 Text Interpretation:  Atrial fibrillation Ventricular premature complex  Abnormal R-wave progression, early transition Borderline T abnormalities,  inferior leads Prolonged QT interval Confirmed by Falesha Schommer  MD, Shahana Capes ([id number])  on 04/17/2014 4:01:56 AM     CRITICAL CARE Performed by: Kalman Drape Total critical care time: 30 min Critical care time was exclusive of separately billable procedures and treating other patients. Critical care was necessary to treat or prevent imminent  or life-threatening deterioration. Critical care was time spent personally by me on the following activities: development of treatment plan with patient and/or surrogate as well as nursing, discussions with consultants, evaluation of patient's response to treatment, examination of patient, obtaining history from patient or surrogate, ordering and performing treatments and interventions, ordering and review of laboratory studies,  ordering and review of radiographic studies, pulse oximetry and re-evaluation of patient's condition. MDM   Final diagnoses:  Fever, unspecified fever cause  Sepsis, due to unspecified organism    81 year old male discharged from the hospital on February 1.  After sepsis from age, who returns with fever.  Patient is tachycardic and hypotensive.  Unknown source, but code sepsis.  Level II initiated.  Patient has responded to fluids at this time.  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  Patient has no complaints aside from cough.  He would also like someone to find him his false teeth.  I personally performed the services described in this documentation, which was scribed in my presence. The recorded information has been reviewed and is accurate.  6:41 AM Case discussed with Dr. Ernestina Patches.  He recommends patient be admitted to stepdown.  There are no current stepdown beds.  Patient will be a carryover to the morning team.  At this time.  Patient has soft blood pressures but is hemodynamically stable.  He has history of Pseudomonas bacteremia, old.  Continue current antibiotics.    Kalman Drape, MD 04/17/14 [phone]

## 2014-04-17 NOTE — Evaluation (Addendum)
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 Noted hx/o pseudomonas bacteremia. Recently discharged for sepsis 2/2 HCAP yesterday. CXR improved infiltrate w/o new consolidation. Trace leuks on UA. Pan culture. Started on vanc, aztreonam, levaquin. T 100.8 on presentation. BP 80s-100s s/p 1L NS bolus. Was sent from SNF for T 102. Lactate 1.58. Accepted to SDU. May need ID involvement.

## 2014-04-17 NOTE — Progress Notes (Signed)
RT Note: Pt given sputum cup & instructed to cough up any secretions & expectorate into cup. Pt verbally understands, RT will monitor.

## 2014-04-17 NOTE — Progress Notes (Signed)
Called NP for clarification of patient's diet. Leave patient NPO for now per NP

## 2014-04-17 NOTE — Progress Notes (Signed)
Received call from lab that sputum collection was no good. Need to recollect.

## 2014-04-17 NOTE — ED Notes (Signed)
Critical care at bedside  

## 2014-04-17 NOTE — ED Notes (Signed)
Spoke with Dr. Candiss Norse about bp. 5th NS bolus ordered. States he will consult critical care and reeval BP after 5th bolus. 2nd IV line started.

## 2014-04-17 NOTE — Progress Notes (Signed)
Patient blood sugar is 65.

## 2014-04-17 NOTE — ED Notes (Addendum)
Admitting MD paged about current bp trends. Waiting call back. Pt states he feels dizzy,strong radial pulse palpated. Pt sleeping but easily arouses. Continues to have good mental status.

## 2014-04-17 NOTE — Consult Note (Signed)
[facility] Infectious Disease    Date of Admission:  04/17/2014  Date of Consult:  04/17/2014  Reason for Consult:sepsis concern for recurrent endograft infeciton Referring Physician: Dr. Candiss Norse, Dr. Elsworth Soho   HPI: [name] is an 81 y.o. 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 He was started on cefepime at that time and was discharged to skilled nursing facility with plan for ongoing IV antibiotics. He has been followed by [facility] infectious disease. His PICC line was removed 12-9 and his antibiotics were changed to by mouth Levaquin by [facility].  He is sent to [facility] on 12-10 with a temp of 103.3 and hypotension In the emergency room he was started on vancomycin and cefepime. His CXR was no acute, his WBC is slightly low at 3.4.  His CT of his graft showed: Stable sizes of thoracoabdominal, distal aortic and RIGHT common iliac artery aneurysms post endoluminal stenting. No evidence of endoleak or adjacent abscess collection. Interval additional stenting of the celiac artery with previously identified pseudoaneurysm no longer opacified and smaller in size. Dissection seen at the common hepatic artery origin on the previous study is not definitely visualized on the current study which demonstrates an interval stent. Suspected blood or debris within urinary bladder, cannot exclude minimal anterior bladder wall thickening. Colonic diverticulosis. Patchy bibasilar atelectasis with mild LEFT upper lobe infiltrate suspicious for pneumonia. Small amount of fluid adjacent to the gallbladder, cannot exclude cholecystitis; recommend clinical correlation and if indicated ultrasound imaging. Electronically Signed By: Lavonia Dana M.D. On: 02/26/2014 13:38   His BCx grew proteus (R- bactrim, cipro, gent. I -  tobra). He was treated with merrem/rifampin and then was supposed to f/u at [facility] for his anbx for his anbx for his graft. He and his family instead chose to f/u at [facility]. By his MAR when seen by Dr. Johnnye Sima on 04/09/14  He was on levaquin/rifampin. By his d/c notes he completed his merrem on 03-07-14. He had C diff+ 02-26-14 and had been on vanco 135m qid  When seen by Dr. HJohnnye Simaon December 26 the patient was hypotensive and tachycardic. He range the patient be seen in the emerge department where C difficile PCR was negative. He was thought at that time to have possible healthcare associated pneumonia with history with IV vancomycin and meropenem and improved clinically and was changed back to oral Levaquin. 2 days after discharge the patient return to the hospital with fever hypotension and concern for recurrent infection. Chest x-ray was clear urinalysis was clean and patient had no urinary symptoms he was not having diarrhea.  Patient was admitted also like to the renal care service has been placed on IV vancomycin and meropenem.   Past Medical History  Diagnosis Date  . Hypertension   . BPH (benign prostatic hyperplasia)   . Hyperlipidemia   . Cancer 2008-2009    prostate TREATED WITH RADIATION  . AAA (abdominal aortic aneurysm) 04/2012    STENTING OF AAA IN [place]  . Junctional cardiac arrhythmia     Occurred postoperatively after urologic surgery  . Severe sinus bradycardia     Occurred postoperatively after urologic surgery  . Aortic valve disorders   . Other primary cardiomyopathies   . Dysphagia   . Status post insertion of percutaneous endoscopic gastrostomy (PEG) tube 02/11/14  . Bacteremia due to Pseudomonas   .  Infected aortic graft   . DVT (deep venous thrombosis) 04/2013    s/p IVC Filter    Past Surgical History  Procedure Laterality Date  . Abdominal surgery      PART OF COLON REMOVED FOR DIVERTICULITIS  . Appendectomy    . Hernia repair    . Bladder  surgery  2008    FOR BLADDER STONE  . Total knee arthroplasty  1990    left  . Abdominal aortic aneurysm repair  04/2012  . Total knee arthroplasty Right 07/26/2012    Procedure: RIGHT TOTAL KNEE ARTHROPLASTY;  Surgeon: Tobi Bastos, MD;  Location: [facility];  Service: Orthopedics;  Laterality: Right;  . Nephrolithotomy Left 04/26/2013    Procedure: LEFT PERCUTANEOUS NEPHROLITHOTOMY ;  Surgeon: [name], MD;  Location: [facility];  Service: Urology;  Laterality: Left;  . Nephrolithotomy Left 05/03/2013    Procedure: 2ND STAGE LEFT PERCUTANEOUS NEPHROLITHOTOMY ;  Surgeon: [name], MD;  Location: [facility];  Service: Urology;  Laterality: Left;  . Esophagogastroduodenoscopy N/A 02/11/2014    LHT:DSKAJ hiatal hernia. Patchy erythema of the gastric mucosa; otherwise negative EGD. Status post 19 French Microvasive PEG tube placement  . Peg placement N/A 02/11/2014    Procedure: PERCUTANEOUS ENDOSCOPIC GASTROSTOMY (PEG) PLACEMENT;  Surgeon: Daneil Dolin, MD;  [facility];  Service: Endoscopy;  Laterality: N/A;  ergies:   Allergies  Allergen Reactions  . Atorvastatin Other (See Comments)    Causes constipation  . Penicillins Other (See Comments)    Other reaction(s): RASH HIVES      Medications: I have reviewed patients current medications as documented in Epic Anti-infectives    Start     Dose/Rate Route Frequency Ordered Stop   04/17/14 1600  vancomycin (VANCOCIN) IVPB 750 mg/150 ml premix     750 mg150 mL/hr over 60 Minutes Intravenous Every 12 hours 04/17/14 0739     04/17/14 0745  meropenem (MERREM) 1 g in sodium chloride 0.9 % 100 mL IVPB     1 g200 mL/hr over 30 Minutes Intravenous 3 times per day 04/17/14 0739     04/17/14 0415  levofloxacin (LEVAQUIN) IVPB 750 mg     750 mg100 mL/hr over 90 Minutes Intravenous  Once 04/17/14 0414 04/17/14 0605   04/17/14 0415  vancomycin (VANCOCIN) IVPB 1000 mg/200 mL premix     1,000 mg200 mL/hr over 60 Minutes Intravenous  Once  04/17/14 0414 04/17/14 0527   04/17/14 0415  aztreonam (AZACTAM) 2 g in dextrose 5 % 50 mL IVPB     2 g100 mL/hr over 30 Minutes Intravenous  Once 04/17/14 0414 04/17/14 0645      Social History:  reports that he quit smoking about 29 years ago. His smoking use included Cigarettes. He started smoking about 65 years ago. He has a 74 pack-year smoking history. He has quit using smokeless tobacco. He reports that he does not drink alcohol or use illicit drugs.  Family History  Problem Relation Age of Onset  . Lung cancer Father     As in HPI and primary teams notes otherwise 12 point review of systems is negative  Blood pressure 143/89, pulse 82, temperature 97.5 F (36.4 C), temperature source Oral, resp. rate 15, height _0  (1.88 m), weight 226 lb 6.6 oz (102.7 kg), SpO2 100 %. General: Alert and awake, oriented x2, not in any acute distress. HEENT:EOMI,  CVS tachycardic rate, normal r,  no murmur rubs or gallops Chest: Fairly clear to auscultation  bilaterally, no wheezing, rales or rhonchi Abdomen: soft nontender, surgical site is clean has a feeding tube in place Extremities: no  clubbing or edema noted bilaterally Skin: no rashes Neuro: nonfocal,   Results for orders placed or performed during the hospital encounter of 04/17/14 (from the past 48 hour(s))  Comprehensive metabolic panel     Status: Abnormal   Collection Time: 04/17/14  4:04 AM  Result Value Ref Range   Sodium 136 135 - 145 mmol/L   Potassium 3.6 3.5 - 5.1 mmol/L   Chloride 104 96 - 112 mmol/L   CO2 24 19 - 32 mmol/L   Glucose, Bld 107 (H) 70 - 99 mg/dL   BUN 21 6 - 23 mg/dL   Creatinine, Ser 0.95 0.50 - 1.35 mg/dL   Calcium 8.9 8.4 - 10.5 mg/dL   Total Protein 6.2 6.0 - 8.3 g/dL   Albumin 2.6 (L) 3.5 - 5.2 g/dL   AST 24 0 - 37 U/L   ALT 8 0 - 53 U/L   Alkaline Phosphatase 58 39 - 117 U/L   Total Bilirubin 0.7 0.3 - 1.2 mg/dL   GFR calc non Af Amer 75 (L) >90 mL/min   GFR calc Af Amer 87 (L) >90 mL/min      Comment: (NOTE) The eGFR has been calculated using the CKD EPI equation. This calculation has not been validated in all clinical situations. eGFR's persistently <90 mL/min signify possible Chronic Kidney Disease.    Anion gap 8 5 - 15  I-Stat CG4 Lactic Acid, ED     Status: None   Collection Time: 04/17/14  4:09 AM  Result Value Ref Range   Lactic Acid, Venous 1.58 0.5 - 2.0 mmol/L  Urinalysis, Routine w reflex microscopic     Status: Abnormal   Collection Time: 04/17/14  4:39 AM  Result Value Ref Range   Color, Urine ORANGE (A) YELLOW    Comment: BIOCHEMICALS MAY BE AFFECTED BY COLOR   APPearance CLEAR CLEAR   Specific Gravity, Urine 1.020 1.005 - 1.030   pH 5.5 5.0 - 8.0   Glucose, UA NEGATIVE NEGATIVE mg/dL   Hgb urine dipstick NEGATIVE NEGATIVE   Bilirubin Urine NEGATIVE NEGATIVE   Ketones, ur NEGATIVE NEGATIVE mg/dL   Protein, ur NEGATIVE NEGATIVE mg/dL   Urobilinogen, UA 0.2 0.0 - 1.0 mg/dL   Nitrite NEGATIVE NEGATIVE   Leukocytes, UA TRACE (A) NEGATIVE  Urine microscopic-add on     Status: None   Collection Time: 04/17/14  4:39 AM  Result Value Ref Range   WBC, UA 0-2 <3 WBC/hpf   Urine-Other AMORPHOUS URATES/PHOSPHATES   CBC with Differential/Platelet     Status: Abnormal   Collection Time: 04/17/14  5:57 AM  Result Value Ref Range   WBC 4.0 4.0 - 10.5 K/uL   RBC 3.04 (L) 4.22 - 5.81 MIL/uL   Hemoglobin 8.2 (L) 13.0 - 17.0 g/dL   HCT 25.4 (L) 39.0 - 52.0 %   MCV 83.6 78.0 - 100.0 fL   MCH 27.0 26.0 - 34.0 pg   MCHC 32.3 30.0 - 36.0 g/dL   RDW 18.0 (H) 11.5 - 15.5 %   Platelets 94 (L) 150 - 400 K/uL    Comment: CONSISTENT WITH PREVIOUS RESULT   Neutrophils Relative % 74 43 - 77 %   Neutro Abs 3.0 1.7 - 7.7 K/uL   Lymphocytes Relative 14 12 - 46 %   Lymphs Abs 0.6 (L) 0.7 - 4.0 K/uL   Monocytes Relative 11 3 -  12 %   Monocytes Absolute 0.5 0.1 - 1.0 K/uL   Eosinophils Relative 1 0 - 5 %   Eosinophils Absolute 0.1 0.0 - 0.7 K/uL   Basophils Relative 0 0 -  1 %   Basophils Absolute 0.0 0.0 - 0.1 K/uL  Glucose, capillary     Status: None   Collection Time: 04/17/14 10:17 AM  Result Value Ref Range   Glucose-Capillary 89 70 - 99 mg/dL  Lactic acid, plasma     Status: None   Collection Time: 04/17/14 11:27 AM  Result Value Ref Range   Lactic Acid, Venous 0.8 0.5 - 2.0 mmol/L  Troponin I     Status: None   Collection Time: 04/17/14 12:28 PM  Result Value Ref Range   Troponin I 0.03 <0.031 ng/mL    Comment:        NO INDICATION OF MYOCARDIAL INJURY.   Protime-INR     Status: Abnormal   Collection Time: 04/17/14 12:28 PM  Result Value Ref Range   Prothrombin Time 17.1 (H) 11.6 - 15.2 seconds   INR 1.38 0.00 - 1.49  APTT     Status: None   Collection Time: 04/17/14 12:28 PM  Result Value Ref Range   aPTT 36 24 - 37 seconds  Fibrinogen     Status: Abnormal   Collection Time: 04/17/14 12:28 PM  Result Value Ref Range   Fibrinogen 190 (L) 204 - 475 mg/dL  Type and screen     Status: None   Collection Time: 04/17/14 12:28 PM  Result Value Ref Range   ABO/RH(D) O POS    Antibody Screen NEG    Sample Expiration 04/20/2014   Culture, sputum-assessment     Status: None   Collection Time: 04/17/14  1:20 PM  Result Value Ref Range   Specimen Description SPUTUM    Special Requests NONE    Sputum evaluation      MICROSCOPIC FINDINGS SUGGEST THAT THIS SPECIMEN IS NOT REPRESENTATIVE OF LOWER RESPIRATORY SECRETIONS. PLEASE RECOLLECT. REPORT CALLED TO L.SATTERFIELD,RN 04/17/14 1354 BY BSLADE    Report Status 04/17/2014 FINAL   Glucose, capillary     Status: Abnormal   Collection Time: 04/17/14  3:49 PM  Result Value Ref Range   Glucose-Capillary 65 (L) 70 - 99 mg/dL  Glucose, capillary     Status: Abnormal   Collection Time: 04/17/14  4:28 PM  Result Value Ref Range   Glucose-Capillary 128 (H) 70 - 99 mg/dL   _0 (sdes,specrequest,cult,reptstatus)   ) Recent Results (from the past 720 hour(s))  Blood Culture (routine x 2)      Status: None   Collection Time: 03/23/14  2:41 PM  Result Value Ref Range Status   Specimen Description BLOOD RIGHT ARM  Final   Special Requests BOTTLES DRAWN AEROBIC AND ANAEROBIC 5CC  Final   Culture   Final    NO GROWTH 5 DAYS Performed at [facility]    Report Status 03/29/2014 FINAL  Final  Blood Culture (routine x 2)     Status: None   Collection Time: 03/23/14  2:59 PM  Result Value Ref Range Status   Specimen Description BLOOD LEFT ARM  Final   Special Requests BOTTLES DRAWN AEROBIC AND ANAEROBIC 5CC  Final   Culture   Final    NO GROWTH 5 DAYS Performed at [facility]    Report Status 03/29/2014 FINAL  Final  Urine culture     Status: None   Collection Time: 03/23/14  5:47 PM  Result Value Ref Range Status   Specimen Description URINE, CLEAN CATCH  Final   Special Requests NONE  Final   Colony Count NO GROWTH Performed at [facility]   Final   Culture NO GROWTH Performed at [facility]   Final   Report Status 03/25/2014 FINAL  Final  MRSA PCR Screening     Status: None   Collection Time: 03/23/14  7:16 PM  Result Value Ref Range Status   MRSA by PCR NEGATIVE NEGATIVE Final    Comment:        The GeneXpert MRSA Assay (FDA approved for NASAL specimens only), is one component of a comprehensive MRSA colonization surveillance program. It is not intended to diagnose MRSA infection nor to guide or monitor treatment for MRSA infections.   Clostridium Difficile by PCR     Status: None   Collection Time: 03/23/14  9:06 PM  Result Value Ref Range Status   C difficile by pcr NEGATIVE NEGATIVE Final  Urine culture     Status: None   Collection Time: 04/09/14  6:18 PM  Result Value Ref Range Status   Specimen Description URINE, CLEAN CATCH  Final   Special Requests NONE  Final   Colony Count NO GROWTH Performed at [facility]   Final   Culture NO GROWTH Performed at [facility]   Final   Report Status  04/11/2014 FINAL  Final  Clostridium Difficile by PCR     Status: None   Collection Time: 04/09/14  6:18 PM  Result Value Ref Range Status   C difficile by pcr NEGATIVE NEGATIVE Final  Stool culture     Status: None   Collection Time: 04/09/14  6:18 PM  Result Value Ref Range Status   Specimen Description STOOL  Final   Special Requests NONE  Final   Culture   Final    NO SALMONELLA, SHIGELLA, CAMPYLOBACTER, YERSINIA, OR E.COLI [id number] ISOLATED Note: REDUCED NORMAL FLORA PRESENT Performed at [facility]    Report Status 04/13/2014 FINAL  Final  Blood Culture (routine x 2)     Status: None   Collection Time: 04/09/14  7:18 PM  Result Value Ref Range Status   Specimen Description BLOOD ARM RIGHT  Final   Special Requests BOTTLES DRAWN AEROBIC AND ANAEROBIC 5CC  Final   Culture   Final    NO GROWTH 5 DAYS Performed at [facility]    Report Status 04/16/2014 FINAL  Final  Blood Culture (routine x 2)     Status: None   Collection Time: 04/09/14  8:23 PM  Result Value Ref Range Status   Specimen Description BLOOD ARM LEFT  Final   Special Requests BOTTLES DRAWN AEROBIC AND ANAEROBIC 5CC  Final   Culture   Final    NO GROWTH 5 DAYS Performed at [facility]    Report Status 04/16/2014 FINAL  Final  MRSA PCR Screening     Status: None   Collection Time: 04/10/14  2:05 AM  Result Value Ref Range Status   MRSA by PCR NEGATIVE NEGATIVE Final    Comment:        The GeneXpert MRSA Assay (FDA approved for NASAL specimens only), is one component of a comprehensive MRSA colonization surveillance program. It is not intended to diagnose MRSA infection nor to guide or monitor treatment for MRSA infections.   Culture, sputum-assessment     Status: None   Collection Time: 04/17/14  1:20 PM  Result Value Ref Range Status   Specimen Description SPUTUM  Final   Special Requests NONE  Final   Sputum evaluation   Final    MICROSCOPIC FINDINGS SUGGEST THAT THIS  SPECIMEN IS NOT REPRESENTATIVE OF LOWER RESPIRATORY SECRETIONS. PLEASE RECOLLECT. REPORT CALLED TO L.SATTERFIELD,RN 04/17/14 1354 BY [facility]SLADE    Report Status 04/17/2014 FINAL  Final     Impression/Recommendation  Principal Problem:   Sepsis Active Problems:   Osteoarthritis of right knee   History of DVT of lower extremity (Feb 2015) -post IVC filter   Aortic valve disorder   Protein-calorie malnutrition, severe   History of Bacteremia due to Pseudomonas   Recurrent bacteremia   CKD (chronic kidney disease), stage III   [name] is a 81 y.o. 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 Recent Proteus bacteremia, C difficile colitis now admitted with picture concerning for septic shock again and concern that endograft may be implicated  #1 Sepsis:   --Agree with his broad-spectrum vancomycin and meropenem at present  --Follow-up blood cultures  --Consider repeat CT of his aneurysm though I would worry about risk of contrast nephropathy and his acute state  He does not have clinical evidence of C. difficile colitis at this point in time as I would not treat him for this  #2 history of C. difficile colitis: Certainly will be at risk for recurrence with broad-spectrum antibiotics but would not start treatment at this point time, NOR look for C diff in the absence of clear-cut evidence for C. difficile colitis as the PCR can identify both positive carriers and active infection. If he has no ileus and is not having multiple bowel movements that are loose at least 3 peripherally greater than 5 and I would not favor looking for C. difficile colitis at this point in time     04/17/2014, 6:14 PM   Thank you so much for this interesting consult  [facility] [place] [phone] (pager) [phone] (office) 04/17/2014, 6:14 PM  [place] 04/17/2014, 6:14 PM

## 2014-04-17 NOTE — Progress Notes (Addendum)
INITIAL NUTRITION ASSESSMENT  DOCUMENTATION CODES Per approved criteria  -Not Applicable   INTERVENTION: Resume TF via PEG with Jevity 1.2 at 25 ml/h and Prostat 30 ml once daily on day 1; on day 2, increase to goal rate of 70 ml/h (1680 ml per day) to provide 2116 kcals, 108 gm protein, 1361 ml free water daily.  NUTRITION DIAGNOSIS: Inadequate oral intake related to inability to eat as evidenced by NPO status.   Goal: Intake to meet >90% of estimated nutrition needs.  Monitor:  TF tolerance/adequacy, weight trend, labs.  Reason for Assessment: MD Consult for TF initiation and management.  81 y.o. male  Admitting Dx: Sepsis  ASSESSMENT: Patient admitted to the hospital on 2/3 with fever and concerns for sepsis.   He has a PEG for TF due to history of dysphagia. Per discussion with patient, he does not know his usual TF regimen or usual weight. Patient received Jevity 1.2 formula via PEG at SNF PTA. He does not take PO's at the SNF per RN's discussion with daughter.  Patient with recent 64 lb weight loss over 7 months from May to December of 2015. PEG was placed February 11, 2014. Weight has stabilized for the past month.  Unable to complete nutrition focused physical exam at this time.  Height: Ht Readings from Last 1 Encounters:  04/17/14 6\' 2"  (1.88 m)    Weight: Wt Readings from Last 1 Encounters:  04/17/14 226 lb 6.6 oz (102.7 kg)    Ideal Body Weight: 86.4 kg  % Ideal Body Weight: 118%  Wt Readings from Last 10 Encounters:  04/17/14 226 lb 6.6 oz (102.7 kg)  04/14/14 210 lb 1.6 oz (95.3 kg)  03/26/14 223 lb 7 oz (101.35 kg)  03/01/14 216 lb 7.9 oz (98.2 kg)  02/13/14 219 lb 12.8 oz (99.7 kg)  01/11/14 240 lb (108.863 kg)  01/02/14 240 lb (108.863 kg)  10/31/13 256 lb (116.121 kg)  08/03/13 280 lb 12.8 oz (127.37 kg)  07/20/13 277 lb (125.646 kg)    Usual Body Weight: 219 lbs (2 months ago)  % Usual Body Weight: 103%  BMI:  Body mass index is 29.06  kg/(m^2).  Estimated Nutritional Needs: Kcal: 2000-2200 Protein: 100-120 gm Fluid: 2-2.2 L  Skin: no issues documented  Diet Order: Diet clear liquid  EDUCATION NEEDS: -No education needs identified at this time   Intake/Output Summary (Last 24 hours) at 04/17/14 1311 Last data filed at 04/17/14 0816  Gross per 24 hour  Intake   4000 ml  Output    300 ml  Net   3700 ml    Last BM: 2/1  Labs:   Recent Labs Lab 04/14/14 0250 04/15/14 0510 04/17/14 0404  NA 140 139 136  K 3.7 4.1 3.6  CL 110 109 104  CO2 26 27 24   BUN 19 19 21   CREATININE 0.74 0.88 0.95  CALCIUM 8.4 8.6 8.9  GLUCOSE 110* 119* 107*    CBG (last 3)   Recent Labs  04/15/14 0828 04/15/14 1140 04/17/14 1017  GLUCAP 113* 99 89    Scheduled Meds: . aspirin  324 mg Oral NOW   Or  . aspirin  300 mg Rectal NOW  . doxazosin  4 mg Oral QHS  . famotidine  40 mg Per Tube Daily  . feeding supplement (PRO-STAT SUGAR FREE 64)  30 mL Oral TID WC  . feeding supplement (VITAL HIGH PROTEIN)  1,000 mL Per Tube Q24H  . fesoterodine  4 mg  Oral Daily  . free water  100 mL Per Tube 3 times per day  . heparin  5,000 Units Subcutaneous 3 times per day  . lactulose  10 g Oral Daily  . meropenem (MERREM) IV  1 g Intravenous 3 times per day  . methylphenidate  5 mg Oral Daily  . mirtazapine  30 mg Oral QHS  . simvastatin  20 mg Oral q1800  . sodium chloride  3 mL Intravenous Q12H  . vancomycin  750 mg Intravenous Q12H    Continuous Infusions: . sodium chloride 125 mL/hr at 04/17/14 1023  . feeding supplement (JEVITY 1.2 CAL)      Past Medical History  Diagnosis Date  . Hypertension   . BPH (benign prostatic hyperplasia)   . Hyperlipidemia   . Cancer 2008-2009    prostate TREATED WITH RADIATION  . AAA (abdominal aortic aneurysm) 04/2012    STENTING OF AAA IN [place]  . Junctional cardiac arrhythmia     Occurred postoperatively after urologic surgery  . Severe sinus bradycardia     Occurred  postoperatively after urologic surgery  . Aortic valve disorders   . Other primary cardiomyopathies   . Dysphagia   . Status post insertion of percutaneous endoscopic gastrostomy (PEG) tube 02/11/14  . Bacteremia due to Pseudomonas   . Infected aortic graft   . DVT (deep venous thrombosis) 04/2013    s/p IVC Filter    Past Surgical History  Procedure Laterality Date  . Abdominal surgery      PART OF COLON REMOVED FOR DIVERTICULITIS  . Appendectomy    . Hernia repair    . Bladder surgery  2008    FOR BLADDER STONE  . Total knee arthroplasty  1990    left  . Abdominal aortic aneurysm repair  04/2012  . Total knee arthroplasty Right 07/26/2012    Procedure: RIGHT TOTAL KNEE ARTHROPLASTY;  Surgeon: Tobi Bastos, MD;  Location: [facility];  Service: Orthopedics;  Laterality: Right;  . Nephrolithotomy Left 04/26/2013    Procedure: LEFT PERCUTANEOUS NEPHROLITHOTOMY ;  Surgeon: [name], MD;  Location: [facility];  Service: Urology;  Laterality: Left;  . Nephrolithotomy Left 05/03/2013    Procedure: 2ND STAGE LEFT PERCUTANEOUS NEPHROLITHOTOMY ;  Surgeon: [name], MD;  Location: [facility];  Service: Urology;  Laterality: Left;  . Esophagogastroduodenoscopy N/A 02/11/2014    VQQ:VZDGL hiatal hernia. Patchy erythema of the gastric mucosa; otherwise negative EGD. Status post 72 French Microvasive PEG tube placement  . Peg placement N/A 02/11/2014    Procedure: PERCUTANEOUS ENDOSCOPIC GASTROSTOMY (PEG) PLACEMENT;  Surgeon: Daneil Dolin, MD;  [facility];  Service: Endoscopy;  Laterality: N/A;    Molli Barrows, RD, LDN, [place] Pager [phone] After Hours Pager [phone]

## 2014-04-17 NOTE — Progress Notes (Addendum)
Clinical Social Work Department BRIEF PSYCHOSOCIAL ASSESSMENT 04/17/2014  Patient:  [name], [name]     Account Number:  [id number]     [place] date:  04/17/2014  Clinical Social Worker:  Forest Gleason  Date/Time:  04/17/2014 08:25 AM  Referred by:  Physician  Date Referred:  04/17/2014 Referred for  SNF Placement   Other Referral:   Patient is from a facility   Interview type:  Patient Other interview type:   Review of chart    PSYCHOSOCIAL DATA Living Status:  FACILITY Admitted from facility:  [facility] Level of care:  [place] Primary support name:  [name] Primary support relationship to patient:  FAMILY Degree of support available:   High level of support.  Family in emergency room with patient and part of treatment plan discussion with MD and nursing staff    CURRENT CONCERNS Current Concerns  Post-Acute Placement   Other Concerns:   none at this time per family    SOCIAL WORK ASSESSMENT / PLAN LCSW was made aware by nursing staff that patient was a recent discharge.  Patient was recently discharged to [facility].  Due to medical problems, patient brought back to hospital to be treated.  Patient plans to return to [facility] per family and patient.  When medically stable, patient to DC with ambulance back to SNF.  Family at the bedside aware and agreeable to plan   Assessment/plan status:  Psychosocial Support/Ongoing Assessment of Needs Other assessment/ plan:   none at this time   Information/referral to community resources:   Complete updated Fl2 with any medical or medication changes    PATIENT'S/FAMILY'S RESPONSE TO PLAN OF CARE: Patient and family disappointed that patient returned so quickly, howvever agreeable to treatment plan of patient being admitted and returning to SNF once medically stable.  Family aware this writer will sign off and patient will be followed by another CSW on the  unit.  NO barriers at this time. Family hopeful for short stay, no needs reported at this time.  LCSW provided emotional support.    [name], MSW Clinical Social Work: Emergency Room [phone]

## 2014-04-17 NOTE — ED Notes (Signed)
Pt brought by [facility] from [facility] home for c/o of 101 temp, Oxi IR given at nursing home repeat temp after medication temp of 100.6 on [facility] arrival, pt denies any pain or discomfort, no vomiting on diarrhea present.

## 2014-04-17 NOTE — Progress Notes (Signed)
ANTIBIOTIC CONSULT NOTE - INITIAL  Pharmacy Consult for vancomycin and meropenem Indication: rule out sepsis  Allergies  Allergen Reactions  . Atorvastatin Other (See Comments)    Causes constipation  . Penicillins Other (See Comments)    Other reaction(s): RASH HIVES     Patient Measurements: Height: 6\' 2"  (188 cm) Weight: 210 lb (95.255 kg) IBW/kg (Calculated) : 82.2  Vital Signs: Temp: 97.9 F (36.6 C) (02/03 0612) Temp Source: Oral (02/03 0612) BP: 69/43 mmHg (02/03 0725) Pulse Rate: 124 (02/03 0725)  Labs:  Recent Labs  04/15/14 0510 04/17/14 0404 04/17/14 0557  WBC 4.4  --  4.0  HGB 8.5*  --  8.2*  PLT 92*  --  94*  CREATININE 0.88 0.95  --    Estimated Creatinine Clearance: 69.7 mL/min (by C-G formula based on Cr of 0.95).    Microbiology: Recent Results (from the past 720 hour(s))  Blood Culture (routine x 2)     Status: None   Collection Time: 03/23/14  2:41 PM  Result Value Ref Range Status   Specimen Description BLOOD RIGHT ARM  Final   Special Requests BOTTLES DRAWN AEROBIC AND ANAEROBIC 5CC  Final   Culture   Final    NO GROWTH 5 DAYS Performed at [facility]    Report Status 03/29/2014 FINAL  Final  Blood Culture (routine x 2)     Status: None   Collection Time: 03/23/14  2:59 PM  Result Value Ref Range Status   Specimen Description BLOOD LEFT ARM  Final   Special Requests BOTTLES DRAWN AEROBIC AND ANAEROBIC 5CC  Final   Culture   Final    NO GROWTH 5 DAYS Performed at [facility]    Report Status 03/29/2014 FINAL  Final  Urine culture     Status: None   Collection Time: 03/23/14  5:47 PM  Result Value Ref Range Status   Specimen Description URINE, CLEAN CATCH  Final   Special Requests NONE  Final   Colony Count NO GROWTH Performed at [facility]   Final   Culture NO GROWTH Performed at [facility]   Final   Report Status 03/25/2014 FINAL  Final  MRSA PCR Screening     Status: None   Collection Time: 03/23/14  7:16 PM  Result Value Ref Range Status   MRSA by PCR NEGATIVE NEGATIVE Final    Comment:        The GeneXpert MRSA Assay (FDA approved for NASAL specimens only), is one component of a comprehensive MRSA colonization surveillance program. It is not intended to diagnose MRSA infection nor to guide or monitor treatment for MRSA infections.   Clostridium Difficile by PCR     Status: None   Collection Time: 03/23/14  9:06 PM  Result Value Ref Range Status   C difficile by pcr NEGATIVE NEGATIVE Final  Urine culture     Status: None   Collection Time: 04/09/14  6:18 PM  Result Value Ref Range Status   Specimen Description URINE, CLEAN CATCH  Final   Special Requests NONE  Final   Colony Count NO GROWTH Performed at [facility]   Final   Culture NO GROWTH Performed at [facility]   Final   Report Status 04/11/2014 FINAL  Final  Clostridium Difficile by PCR     Status: None   Collection Time: 04/09/14  6:18 PM  Result Value Ref Range Status   C difficile by pcr NEGATIVE NEGATIVE  Final  Stool culture     Status: None   Collection Time: 04/09/14  6:18 PM  Result Value Ref Range Status   Specimen Description STOOL  Final   Special Requests NONE  Final   Culture   Final    NO SALMONELLA, SHIGELLA, CAMPYLOBACTER, YERSINIA, OR E.COLI [id number] ISOLATED Note: REDUCED NORMAL FLORA PRESENT Performed at [facility]    Report Status 04/13/2014 FINAL  Final  Blood Culture (routine x 2)     Status: None   Collection Time: 04/09/14  7:18 PM  Result Value Ref Range Status   Specimen Description BLOOD ARM RIGHT  Final   Special Requests BOTTLES DRAWN AEROBIC AND ANAEROBIC 5CC  Final   Culture   Final    NO GROWTH 5 DAYS Performed at [facility]    Report Status 04/16/2014 FINAL  Final  Blood Culture (routine x 2)     Status: None   Collection Time: 04/09/14  8:23 PM  Result Value Ref Range Status   Specimen Description  BLOOD ARM LEFT  Final   Special Requests BOTTLES DRAWN AEROBIC AND ANAEROBIC 5CC  Final   Culture   Final    NO GROWTH 5 DAYS Performed at [facility]    Report Status 04/16/2014 FINAL  Final  MRSA PCR Screening     Status: None   Collection Time: 04/10/14  2:05 AM  Result Value Ref Range Status   MRSA by PCR NEGATIVE NEGATIVE Final    Comment:        The GeneXpert MRSA Assay (FDA approved for NASAL specimens only), is one component of a comprehensive MRSA colonization surveillance program. It is not intended to diagnose MRSA infection nor to guide or monitor treatment for MRSA infections.     Medical History: Past Medical History  Diagnosis Date  . Hypertension   . BPH (benign prostatic hyperplasia)   . Hyperlipidemia   . Cancer 2008-2009    prostate TREATED WITH RADIATION  . AAA (abdominal aortic aneurysm) 04/2012    STENTING OF AAA IN [place]  . Junctional cardiac arrhythmia     Occurred postoperatively after urologic surgery  . Severe sinus bradycardia     Occurred postoperatively after urologic surgery  . Aortic valve disorders   . Other primary cardiomyopathies   . Dysphagia   . Status post insertion of percutaneous endoscopic gastrostomy (PEG) tube 02/11/14  . Bacteremia due to Pseudomonas   . Infected aortic graft      Assessment: 81yo male with multiple recent admissions presents from NH for fever, concern for sepsis given recent admissions and HCAP, to begin IV ABX; pt has been on vanc and Merrem recently though SCr has greatly improved since most recent admission.  Goal of Therapy:  Vancomycin trough level 15-20 mcg/ml  Plan:  Rec'd vanc 1g and aztreonam in ED; will continue with vancomycin 750mg  IV Q12H and Merrem 1g IV Q8H and monitor CBC, Cx, SCr, levels prn.  Wynona Neat, PharmD, BCPS  04/17/2014,7:39 AM

## 2014-04-17 NOTE — H&P (Addendum)
Patient Demographics  [name], is a 81 y.o. male  MRN: [id number]   DOB - [birth year]  Admit Date - 04/17/2014  Outpatient Primary MD for the patient is [name], MD   With History of -  Past Medical History  Diagnosis Date  . Hypertension   . BPH (benign prostatic hyperplasia)   . Hyperlipidemia   . Cancer 2008-2009    prostate TREATED WITH RADIATION  . AAA (abdominal aortic aneurysm) 04/2012    STENTING OF AAA IN [place]  . Junctional cardiac arrhythmia     Occurred postoperatively after urologic surgery  . Severe sinus bradycardia     Occurred postoperatively after urologic surgery  . Aortic valve disorders   . Other primary cardiomyopathies   . Dysphagia   . Status post insertion of percutaneous endoscopic gastrostomy (PEG) tube 02/11/14  . Bacteremia due to Pseudomonas   . Infected aortic graft       Past Surgical History  Procedure Laterality Date  . Abdominal surgery      PART OF COLON REMOVED FOR DIVERTICULITIS  . Appendectomy    . Hernia repair    . Bladder surgery  2008    FOR BLADDER STONE  . Total knee arthroplasty  1990    left  . Abdominal aortic aneurysm repair  04/2012  . Total knee arthroplasty Right 07/26/2012    Procedure: RIGHT TOTAL KNEE ARTHROPLASTY;  Surgeon: Tobi Bastos, MD;  Location: [facility];  Service: Orthopedics;  Laterality: Right;  . Nephrolithotomy Left 04/26/2013    Procedure: LEFT PERCUTANEOUS NEPHROLITHOTOMY ;  Surgeon: [name], MD;  Location: [facility];  Service: Urology;  Laterality: Left;  . Nephrolithotomy Left 05/03/2013    Procedure: 2ND STAGE LEFT PERCUTANEOUS NEPHROLITHOTOMY ;  Surgeon: [name], MD;  Location: [facility];  Service: Urology;  Laterality: Left;  . Esophagogastroduodenoscopy N/A 02/11/2014    OMV:[facility] hiatal hernia. Patchy  erythema of the gastric mucosa; otherwise negative EGD. Status post 31 French Microvasive PEG tube placement  . Peg placement N/A 02/11/2014    Procedure: PERCUTANEOUS ENDOSCOPIC GASTROSTOMY (PEG) PLACEMENT;  Surgeon: Daneil Dolin, MD;  Location: [facility];  Service: Endoscopy;  Laterality: N/A;    in for   Chief Complaint  Patient presents with  . Fever     HPI  [name]  is a 81 y.o. 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  He now presents with low-grade fever, hypotension, sepsis with unclear source, chest x-ray  is improved from before, UA is better, sepsis protocol was activated in the ER and I was called to admit the patient. Patient himself is noted to be symptom-free, does not feel that he has a fever, no headache chest or abdominal pain. He is not short of breath denies any cough. No diarrhea.    Review of Systems    In addition to the HPI above,  No Fever-chills, No Headache, No changes with Vision or hearing, No problems swallowing food or Liquids, No Chest pain, Cough or Shortness of Breath, No Abdominal pain, No Nausea or Vommitting, Bowel movements are regular, No Blood in stool or Urine, No dysuria, No new skin rashes or bruises, No new joints pains-aches,  No new weakness, tingling, numbness in any extremity, No recent weight gain or loss, No polyuria, polydypsia or polyphagia, No significant Mental Stressors.  A full 10  point Review of Systems was done, except as stated above, all other Review of Systems were negative.   Social History History  Substance Use Topics  . Smoking status: Former Smoker -- 2.00 packs/day for 37 years    Types: Cigarettes    Start date: 02/17/1949    Quit date: 03/15/1985  . Smokeless tobacco: Former Systems developer     Comment: quit 25 years ago  . Alcohol Use: No      Family History Family History  Problem Relation Age of Onset  . Lung cancer Father       Prior to Admission medications   Medication Sig Start Date End Date Taking? Authorizing Provider  acetaminophen (TYLENOL) 500 MG tablet Take 1,000 mg by mouth every 6 (six) hours as needed for mild pain or moderate pain.   Yes Historical Provider, MD  albuterol (PROVENTIL) (2.5 MG/3ML) 0.083% nebulizer solution Take 3 mLs (2.5 mg total) by nebulization every 2 (two) hours as needed for wheezing or shortness of breath. 02/28/14  Yes Marianne L York, PA-C  Amino Acids-Protein Hydrolys (FEEDING SUPPLEMENT, PRO-STAT SUGAR FREE 64,) LIQD Take 30 mLs by mouth 3 (three) times daily with meals. 03/27/14  Yes Maryann Mikhail, DO  bisacodyl (DULCOLAX) 10 MG suppository Place 10 mg rectally as needed for mild constipation or moderate constipation.   Yes Historical Provider, MD  doxazosin (CARDURA) 4 MG tablet Take 4 mg by mouth at bedtime.   Yes Historical Provider, MD  famotidine (PEPCID) 40 MG/5ML suspension Place 5 mLs (40 mg total) into feeding tube daily. 03/01/14  Yes Marianne L York, PA-C  lactulose (CHRONULAC) 10 GM/15ML solution Take 10 g by mouth daily. Every day per [facility]   Yes Historical Provider, MD  levofloxacin (LEVAQUIN) 750 MG tablet Please resume oral levaquin when IV Meropenem is complete on 12/25.  Continue indefintely for suppression of infection.  This medication will be managed by Infectious Disease at [facility]. Patient taking differently: Take 750 mg by mouth daily. Please resume oral levaquin when IV Meropenem is complete on  12/25.  Continue indefintely for suppression of infection.  This medication will be managed by Infectious Disease at [facility]. 02/28/14  Yes Bobby Rumpf York, PA-C  LORazepam (ATIVAN) 2 MG/ML concentrated solution Take 0.1 mLs (0.2 mg total) by mouth every 6 (six) hours as needed for anxiety. 04/15/14  Yes Eugenie Filler, MD  magnesium hydroxide (MILK OF MAGNESIA) 400 MG/5ML suspension Take 30 mLs by mouth daily as needed for mild constipation.   Yes Historical Provider, MD  methylphenidate (RITALIN) 5 MG tablet Take 1 tablet (5 mg total) by mouth daily.  04/08/14  Yes Tiffany L Reed, DO  metoprolol succinate (TOPROL-XL) 25 MG 24 hr tablet Take 25 mg by mouth daily.   Yes Historical Provider, MD  mirtazapine (REMERON) 30 MG tablet Take 30 mg by mouth at bedtime.   Yes Historical Provider, MD  rifampin (RIFADIN) 300 MG capsule Take 300 mg by mouth every 12 (twelve) hours.   Yes Historical Provider, MD  simvastatin (ZOCOR) 20 MG tablet Take 20 mg by mouth daily.   Yes Historical Provider, MD  sodium phosphate (FLEET) enema Place 1 enema rectally as needed (for constipation). follow package directions   Yes Historical Provider, MD  tolterodine (DETROL LA) 4 MG 24 hr capsule Take 4 mg by mouth daily.   Yes Historical Provider, MD  traMADol (ULTRAM) 50 MG tablet Place 1 tablet (50 mg total) into feeding tube every 6 (six) hours as needed for moderate pain. 04/15/14  Yes Eugenie Filler, MD  triamcinolone cream (KENALOG) 0.1 % Apply 1 application topically 2 (two) times daily as needed (rash).   Yes Historical Provider, MD  Nutritional Supplements (FEEDING SUPPLEMENT, JEVITY 1.2 CAL,) LIQD Place 1,000 mLs into feeding tube continuous. Patient not taking: Reported on 04/17/2014 02/13/14   Erline Hau, MD  Water For Irrigation, Sterile (FREE WATER) SOLN Place 100 mLs into feeding tube every 8 (eight) hours. Patient not taking: Reported on 04/17/2014 02/28/14   Melton Alar, PA-C    Allergies    Allergen Reactions  . Atorvastatin Other (See Comments)    Causes constipation  . Penicillins Other (See Comments)    Other reaction(s): RASH HIVES     Physical Exam  Vitals  Blood pressure 69/43, pulse 124, temperature 97.9 F (36.6 C), temperature source Oral, resp. rate 24, height 6\' 2"  (1.88 m), weight 95.255 kg (210 lb), SpO2 96 %.   1. General elderly white male  lying in bed in NAD,     2. Normal affect and insight, Not Suicidal or Homicidal, Awake Alert, Oriented X 3.  3. No F.N deficits, ALL C.Nerves Intact, Strength 5/5 all 4 extremities, Sensation intact all 4 extremities, Plantars down going.  4. Ears and Eyes appear Normal, Conjunctivae clear, PERRLA. Moist Oral Mucosa.  5. Supple Neck, No JVD, No cervical lymphadenopathy appriciated, No Carotid Bruits.  6. Symmetrical Chest wall movement, Good air movement bilaterally, CTAB.  7. RRR, No Gallops, Rubs or Murmurs, No Parasternal Heave.  8. Positive Bowel Sounds, Abdomen Soft, No tenderness, No organomegaly appriciated,No rebound -guarding or rigidity, PEG in place  9.  No Cyanosis, Normal Skin Turgor, No Skin Rash or Bruise. Lower back stage II decubitus ulcer under bandage.  10. Good muscle tone,  joints appear normal , no effusions, Normal ROM.  11. No Palpable Lymph Nodes in Neck or Axillae     Data Review  CBC  Recent Labs Lab 04/12/14 0430 04/13/14 0358 04/14/14 0250 04/15/14 0510 04/17/14 0557  WBC 4.0 4.5 3.7* 4.4 4.0  HGB 9.6* 8.7* 8.4* 8.5* 8.2*  HCT 30.6* 27.2* 26.3* 26.6* 25.4*  PLT 92* 82* 81* 92* 94*  MCV 87.4 84.7 85.1 85.0 83.6  MCH 27.4 27.1 27.2 27.2 27.0  MCHC 31.4 32.0 31.9 32.0 32.3  RDW 18.2* 18.4* 18.3* 18.1* 18.0*  LYMPHSABS  --   --   --   --  0.6*  MONOABS  --   --   --   --  0.5  EOSABS  --   --   --   --  0.1  BASOSABS  --   --   --   --  0.0    ------------------------------------------------------------------------------------------------------------------  Chemistries   Recent Labs Lab 04/12/14 0430 04/13/14 0358 04/14/14 0250 04/15/14 0510 04/17/14 0404  NA 143 140 140 139 136  K 4.0 3.5 3.7 4.1 3.6  CL 112 110 110 109 104  CO2 28 26 26 27 24   GLUCOSE 119* 102* 110* 119* 107*  BUN 27* 22 19 19 21   CREATININE 0.94 0.85 0.74 0.88 0.95  CALCIUM 8.7 8.5 8.4 8.6 8.9  AST  --   --   --   --  24  ALT  --   --   --   --  8  ALKPHOS  --   --   --   --  58  BILITOT  --   --   --   --  0.7   ------------------------------------------------------------------------------------------------------------------ estimated creatinine clearance is 69.7 mL/min (by C-G formula based on Cr of 0.95). ------------------------------------------------------------------------------------------------------------------ No results for input(s): TSH, T4TOTAL, T3FREE, THYROIDAB in the last 72 hours.  Invalid input(s): FREET3   Coagulation profile No results for input(s): INR, PROTIME in the last 168 hours. ------------------------------------------------------------------------------------------------------------------- No results for input(s): DDIMER in the last 72 hours. -------------------------------------------------------------------------------------------------------------------  Cardiac Enzymes  Recent Labs Lab 04/10/14 1000  TROPONINI 0.40*   ------------------------------------------------------------------------------------------------------------------ Invalid input(s): POCBNP   ---------------------------------------------------------------------------------------------------------------  Urinalysis    Component Value Date/Time   COLORURINE ORANGE* 04/17/2014 0439   APPEARANCEUR CLEAR 04/17/2014 0439   LABSPEC 1.020 04/17/2014 0439   PHURINE 5.5 04/17/2014 0439   GLUCOSEU NEGATIVE 04/17/2014 0439   HGBUR  NEGATIVE 04/17/2014 0439   BILIRUBINUR NEGATIVE 04/17/2014 0439   KETONESUR NEGATIVE 04/17/2014 0439   PROTEINUR NEGATIVE 04/17/2014 0439   UROBILINOGEN 0.2 04/17/2014 0439   NITRITE NEGATIVE 04/17/2014 0439   LEUKOCYTESUR TRACE* 04/17/2014 0439    ----------------------------------------------------------------------------------------------------------------  Imaging results:   Dg Chest Port 1 View  04/17/2014   CLINICAL DATA:  Fever.  EXAM: PORTABLE CHEST - 1 VIEW  COMPARISON:  04/09/2014  FINDINGS: Cardiomediastinal contours with aortic stent graft in place, unchanged from prior exam. Mildly improved right basilar opacity with residual atelectasis. Unchanged atelectasis at the left lung base. No large pleural effusion or pneumothorax. Osseous structures are stable.  IMPRESSION: Improved right basilar aeration. No new consolidation to suggest progressive or new pneumonia.   Electronically Signed   By: Jeb Levering M.D.   On: 04/17/2014 04:15    My personal review of EKG: Rhythm Afib, Rate 117 /min,  no Acute ST changes    Assessment & Plan    1. Sepsis in a patient with Pseudomonas bacteremia from endovascular Triple-A graft, finished treatment last week of December for this infection. Recently admitted for HCAP.  Currently he is not short of breath, no cough, chest x-ray has improved, I doubt this is pneumonia, I suspect this could be AAA graft infection, blood cultures urine and sputum cultures drawn in the ER, will place him on IV vancomycin and meropenem, have requested infectious disease physician Dr. Drucilla Schmidt to evaluate him. Have told family that his prognosis is poor. They want to try gentle medical treatment, CPR without intubation. If he declines further they will consider comfort care at this time. IV fluid bolus and maintenance will be continued. Lactic acid is under 2.    2. AAA endograft infection with Pseudomonas bacteremia. As in #1 above.    3. Chronic combined  diastolic and systolic heart failure. EF 35% on recent  echo. Currently compensated, IV fluids for hypotension, hold beta blocker, hold ACE/ARB. Monitor intake output and daily weights.   4. Dyslipidemia. Continue home dose statin   5. Chronic dysphagia. All diet medications via PEG tube, nutritionist consulted.   6. History of DVT. Status post IVC filter. He has history of chronic endovascular graft site bleeding. Not anti-coag addition candidate. Low-dose heparin for DVT prophylaxis and monitor.   7. Severe protein calorie malnutrition. Consulted nutritionist.   8. A. fib. A flutter noted on recent echo a few weeks ago. Not anti-coag addition candidate due to chronic endovascular graft site bleed and poor health status, currently scheduled beta blocker held due to dehydration with rapid rate, hydrate with IV fluids, as needed IV Lopressor. If blood pressure stays soft and rate elevated may require amiodarone. Will request cardiology to assist.   9. Chronic mild thrombocytopenia. Monitor.     Addendum. 7:45 AM. Despite 4 L of IV fluid bolus systolic blood pressure still in 80s. Patient lightheaded. I have consulted critical care for ICU transfer. May require brief pressor support.     DVT Prophylaxis Heparin    AM Labs Ordered, also please review Full Orders  Family Communication: Admission, patients condition and plan of care including tests being ordered have been discussed with the patient and family who indicate understanding and agree with the plan and Code Status.  Code Status no intubation  Likely DC to  SNF  Condition GUARDED     Time spent in minutes : 35    Clemente Dewey K M.D on 04/17/2014 at 7:38 AM  Between 7am to 7pm - Pager - [phone]  After 7pm go to [email or web address] - [facility] Office  [phone]

## 2014-04-17 NOTE — Consult Note (Addendum)
PULMONARY / CRITICAL CARE MEDICINE   Name: [name] MRN: [id number] DOB: 1930-01-15    ADMISSION DATE:  04/17/2014 CONSULTATION DATE:  04/17/14  REFERRING MD :  Dr. Candiss Norse / [facility]   CHIEF COMPLAINT:  Sepsis   INITIAL PRESENTATION: 81 y/o M with PMH of DVT s/p IVC filter, AAA repair with prior cultures positive for pseudomonas & proteus who was admitted 2/3 with reports of fever and concern for sepsis.  PCCM consulted for evaluation.    STUDIES:  12/15  CTA ABD >> stable size of aneurysms, no evidence of endoleak or adjacent abscess collection  12/15  CTA CHEST >> no PE, L infiltrate   SIGNIFICANT EVENTS: 1/26-2/1  Admit for sepsis 2/2 HCAP   HISTORY OF PRESENT ILLNESS:  81 y/o M with PMH of hypertension, BPH, hyperlipidemia, prostate cancer status post radiation, dysphasia/poor PO intake status post PEG placement, DVT s/p IVC Filter (04/2013),  thoracoabdominal aortic aneurysm in July 4580 which was complicated by pseudomonal graft infection (Pseudomonas was sensitive to Ceptaz, Zosyn, gent, Levaquin) and subsequent repeat pseudomonal infection in October 2015 which was pansensitive.  In December 2015 he was seen for a febrile illness and hypotension. At that time CTA of the abdomen and chest showed stable sizes of thoracoabdominal, distal aortic and right common iliac artery aneurysms post endoluminal stenting without evidence of endoleak or abscess collection. At that time his blood cultures grew Proteus (R-Bactrim, Cipro, gent. I-Tobra) and he was treated with meropenem and rifampin. His course at that time was complicated by C. difficile infection for which he has completed treatment and has had subsequent negative C. difficile PCR (04/09/14). He also had a recent admission from 1/26-2/14 sepsis thought related to HCAP.  He was discharged back to a SNF.    The patient presented to the [facility] ER on 2/3 with reports of fever for 48 hours. He was medicated at the nursing home with [facility]  (?) with repeat temperatures of 100.6.  ER evaluation noted initial blood pressures of 90s/ 60s.  Labs notable for Na 136, K 3.6, sr cr 0.95, lactic acid 1.58, WBC 4, hbg 8.2 and platelets 94.  While in the ER the patient developed hypotension which responded to 5 L of normal saline. PC CM consulted for evaluation.      PAST MEDICAL HISTORY :   has a past medical history of Hypertension; BPH (benign prostatic hyperplasia); Hyperlipidemia; Cancer (2008-2009); AAA (abdominal aortic aneurysm) (04/2012); Junctional cardiac arrhythmia; Severe sinus bradycardia; Aortic valve disorders; Other primary cardiomyopathies; Dysphagia; Status post insertion of percutaneous endoscopic gastrostomy (PEG) tube (02/11/14); Bacteremia due to Pseudomonas; and Infected aortic graft.  has past surgical history that includes Abdominal surgery; Appendectomy; Hernia repair; Bladder surgery (2008); Total knee arthroplasty (1990); Abdominal aortic aneurysm repair (04/2012); Total knee arthroplasty (Right, 07/26/2012); Nephrolithotomy (Left, 04/26/2013); Nephrolithotomy (Left, 05/03/2013); Esophagogastroduodenoscopy (N/A, 02/11/2014); and PEG placement (N/A, 02/11/2014).   HOME MEDICATIONS:  Prior to Admission medications   Medication Sig Start Date End Date Taking? Authorizing Provider  acetaminophen (TYLENOL) 500 MG tablet Take 1,000 mg by mouth every 6 (six) hours as needed for mild pain or moderate pain.   Yes Historical Provider, MD  albuterol (PROVENTIL) (2.5 MG/3ML) 0.083% nebulizer solution Take 3 mLs (2.5 mg total) by nebulization every 2 (two) hours as needed for wheezing or shortness of breath. 02/28/14  Yes Marianne L York, PA-C  Amino Acids-Protein Hydrolys (FEEDING SUPPLEMENT, PRO-STAT SUGAR FREE 64,) LIQD Take 30 mLs by mouth 3 (three) times daily  with meals. 03/27/14  Yes Maryann Mikhail, DO  bisacodyl (DULCOLAX) 10 MG suppository Place 10 mg rectally as needed for mild constipation or moderate constipation.   Yes  Historical Provider, MD  doxazosin (CARDURA) 4 MG tablet Take 4 mg by mouth at bedtime.   Yes Historical Provider, MD  famotidine (PEPCID) 40 MG/5ML suspension Place 5 mLs (40 mg total) into feeding tube daily. 03/01/14  Yes Marianne L York, PA-C  lactulose (CHRONULAC) 10 GM/15ML solution Take 10 g by mouth daily. Every day per [facility]   Yes Historical Provider, MD  levofloxacin (LEVAQUIN) 750 MG tablet Please resume oral levaquin when IV Meropenem is complete on 12/25.  Continue indefintely for suppression of infection.  This medication will be managed by Infectious Disease at [facility] Patient taking differently: Take 750 mg by mouth daily. Please resume oral levaquin when IV Meropenem is complete on 12/25.  Continue indefintely for suppression of infection.  This medication will be managed by Infectious Disease at [facility]. 02/28/14  Yes Bobby Rumpf York, PA-C  LORazepam (ATIVAN) 2 MG/ML concentrated solution Take 0.1 mLs (0.2 mg total) by mouth every 6 (six) hours as needed for anxiety. 04/15/14  Yes Eugenie Filler, MD  magnesium hydroxide (MILK OF MAGNESIA) 400 MG/5ML suspension Take 30 mLs by mouth daily as needed for mild constipation.   Yes Historical Provider, MD  methylphenidate (RITALIN) 5 MG tablet Take 1 tablet (5 mg total) by mouth daily. 04/08/14  Yes Tiffany L Reed, DO  metoprolol succinate (TOPROL-XL) 25 MG 24 hr tablet Take 25 mg by mouth daily.   Yes Historical Provider, MD  mirtazapine (REMERON) 30 MG tablet Take 30 mg by mouth at bedtime.   Yes Historical Provider, MD  rifampin (RIFADIN) 300 MG capsule Take 300 mg by mouth every 12 (twelve) hours.   Yes Historical Provider, MD  simvastatin (ZOCOR) 20 MG tablet Take 20 mg by mouth daily.   Yes Historical Provider, MD  sodium phosphate (FLEET) enema Place 1 enema rectally as needed (for constipation). follow package directions   Yes Historical Provider, MD  tolterodine (DETROL LA) 4 MG 24 hr capsule Take 4 mg by mouth daily.   Yes Historical  Provider, MD  traMADol (ULTRAM) 50 MG tablet Place 1 tablet (50 mg total) into feeding tube every 6 (six) hours as needed for moderate pain. 04/15/14  Yes Eugenie Filler, MD  triamcinolone cream (KENALOG) 0.1 % Apply 1 application topically 2 (two) times daily as needed (rash).   Yes Historical Provider, MD  Nutritional Supplements (FEEDING SUPPLEMENT, JEVITY 1.2 CAL,) LIQD Place 1,000 mLs into feeding tube continuous. Patient not taking: Reported on 04/17/2014 02/13/14   Erline Hau, MD  Water For Irrigation, Sterile (FREE WATER) SOLN Place 100 mLs into feeding tube every 8 (eight) hours. Patient not taking: Reported on 04/17/2014 02/28/14   Melton Alar, PA-C   Allergies  Allergen Reactions  . Atorvastatin Other (See Comments)    Causes constipation  . Penicillins Other (See Comments)    Other reaction(s): RASH HIVES     FAMILY HISTORY:  indicated that his mother is deceased. He indicated that his father is deceased.    SOCIAL HISTORY:  reports that he quit smoking about 29 years ago. His smoking use included Cigarettes. He started smoking about 65 years ago. He has a 74 pack-year smoking history. He has quit using smokeless tobacco. He reports that he does not drink alcohol or use illicit drugs.  REVIEW OF SYSTEMS:  Gen: Denies weight change, fatigue, night sweats.  Reports fevers / chills HEENT: Denies blurred vision, double vision, hearing loss, tinnitus, sinus congestion, rhinorrhea, sore throat, neck stiffness, dysphagia PULM: Denies shortness of breath, sputum production, hemoptysis, wheezing.  Reports ongoing cough CV: Denies chest pain, edema, orthopnea, paroxysmal nocturnal dyspnea, palpitations GI: Denies abdominal pain, nausea, vomiting, hematochezia, melena, constipation, change in bowel habits.  Reports diarrhea - ongoing but unchanged.  Relates to TF.   GU: Denies dysuria, hematuria, polyuria, oliguria, urethral discharge Endocrine: Denies hot or cold  intolerance, polyuria, polyphagia or appetite change Derm: Denies rash, dry skin, scaling or peeling skin change Heme: Denies easy bruising, bleeding, bleeding gums Neuro: Denies headache, numbness, weakness, slurred speech, loss of memory or consciousness   SUBJECTIVE:   VITAL SIGNS: Temp:  [97.9 F (36.6 C)-100 F (37.8 C)] 97.9 F (36.6 C) (02/03 0612) Pulse Rate:  [83-131] 87 (02/03 0845) Resp:  [13-28] 16 (02/03 0845) BP: (69-126)/(43-91) 100/65 mmHg (02/03 0845) SpO2:  [92 %-100 %] 100 % (02/03 0845) Weight:  [210 lb (95.255 kg)] 210 lb (95.255 kg) (02/03 0358)   HEMODYNAMICS:     VENTILATOR SETTINGS:     INTAKE / OUTPUT:  Intake/Output Summary (Last 24 hours) at 04/17/14 6203 Last data filed at 04/17/14 0816  Gross per 24 hour  Intake   4000 ml  Output    300 ml  Net   3700 ml    PHYSICAL EXAMINATION: General:  Elderly male in NAD Neuro:  Drowsy, awakens, alert / appropriate  HEENT:  Mm pink/dry, no jvd Cardiovascular:  s1s2 rrr, 3/6 SEM Lungs:  resp's even/non-labored, lungs bilaterally clear  Abdomen:  NTND, PEG c/d Musculoskeletal:  No acute deformities  Skin:  Warm/dry, LLE > RLE  LABS:  CBC  Recent Labs Lab 04/14/14 0250 04/15/14 0510 04/17/14 0557  WBC 3.7* 4.4 4.0  HGB 8.4* 8.5* 8.2*  HCT 26.3* 26.6* 25.4*  PLT 81* 92* 94*   Coag's No results for input(s): APTT, INR in the last 168 hours.   BMET  Recent Labs Lab 04/14/14 0250 04/15/14 0510 04/17/14 0404  NA 140 139 136  K 3.7 4.1 3.6  CL 110 109 104  CO2 26 27 24   BUN 19 19 21   CREATININE 0.74 0.88 0.95  GLUCOSE 110* 119* 107*   Electrolytes  Recent Labs Lab 04/14/14 0250 04/15/14 0510 04/17/14 0404  CALCIUM 8.4 8.6 8.9   Sepsis Markers  Recent Labs Lab 04/12/14 0330 04/17/14 0409  LATICACIDVEN 1.3 1.58   ABG No results for input(s): PHART, PCO2ART, PO2ART in the last 168 hours.   Liver Enzymes  Recent Labs Lab 04/17/14 0404  AST 24  ALT 8  ALKPHOS  58  BILITOT 0.7  ALBUMIN 2.6*   Cardiac Enzymes  Recent Labs Lab 04/10/14 1000  TROPONINI 0.40*   Glucose  Recent Labs Lab 04/14/14 1555 04/14/14 2004 04/14/14 2344 04/15/14 0428 04/15/14 0828 04/15/14 1140  GLUCAP 100* 100* 113* 114* 113* 99    Imaging No results found.   ASSESSMENT / PLAN:  INFECTIOUS A:   Sepsis - concern for bacteremia with neg urine, CXR without overt infiltrate Hx C-Diff - neg PCR 1/26 Hx AAA with infected graft - with hx of pseudomonal & proteus bacteremia   P:   BCx2  2/3 >>  UC  2/3 >> C-Diff 2/3 >>   Vanco, start date 2/3, day 1/x Meropenem, start date 2/3, day 1/x  ID consulted, discussed abx with Dr. Tommy Medal.  Hold Rx for C-Diff  Volume resuscitation  Await cultures as above   CARDIOVASCULAR CVL A:  Sepsis - ? If hypotension compounded by OxyIR administration.  Neg lactate reassuring.  Hx HTN, HLD, Combined CHF  AFib/Flutter - noted on ECHO, not an anticoagulation candidate P:  Hold home medications: cardura, metoprolol, zocor ICU admit for observation  Full Code Goal MAP > 65  PULMONARY OETT  A: No acute issues P:   Pulmonary hygiene Mobilize as able  F/U cxr in am to ensure no developing infiltrate  RENAL A:   No acute issues  BPH P:   Trend BMP Replace electrolytes  Hold detrol   GASTROINTESTINAL A:   Dysphagia s/p PEG - family reports placed for poor PO intake Severe Protein Calorie Malnutrition  Chronic Diarrhea P:   PEG care per protocol  Nutrition consult Continue TF, ? If he would tolerate bolus feedings.  Continuous feedings not allowing for hunger and adequate PO intake.  Hold lactulose & dulcolax, ? Why he would be on with hx of chronic diarrhea  HEMATOLOGIC A:   Thrombocytopenia - has been chronic since 12/2013 Hx DVT s/p IVC Filter - DVT in LLE, remains larger than R on exam P:  Monitor CBC Heparin for DVT prophylaxis  ENDOCRINE A:   Mild Hyperglycemia    P:   Monitor  glucose on BMP, if consistently > 180 add SSI  NEUROLOGIC A:   Sedation - post oxy IR P:   RASS goal: n/a Hold sedating medications   FAMILY  - Updates: Family updated at bedside.    - Inter-disciplinary family Elijah or Palliative Care meeting due by:  2/11.  Pt would like to be a full code for short term intervention only.  He would not want prolonged support.     Noe Gens, NP-C [facility] Pgr: [phone] or [phone]    04/17/2014, 9:22 AM  ATTENDING NOTE: I have personally reviewed patient's available data, including medical history, events of note, physical examination and test results as part of my evaluation. I have discussed with resident/NP and other careteam providers such as pharmacist, RN and RRT & co-ordinated with consultants. In addition, I personally evaluated patient and elicited key history of nursing home resident, infected thoracic abdominal aneurysm graft-with Pseudomonas bacteremia in 08/2013 and Proteus bacteremia and 02/2014-followed by ID, recent admission from 1/26 for sepsis syndrome, recent C. difficile colitis, admitted for hypotension -not responding to 4 L IV fluids in ED ,exam findings of alert, non-focal, mildly confused, soft abdomen, good pulses, & labs showing normal lactate, no leukocytosis, negative urinalysis  Impression- treat as septic shock, AAA graft not amenable to surgery Given normal lactate, reasonable to take conservative approach and tolerate soft blood pressure. Given prior bugs isolated, treat with broad-spectrum-meropenem and vancomycin. Add Flagyl for C. difficile coverage given prior history.    Volume status and tissue perfusion was assessed clinically by examination of cardiopulmonary system, good cap refill, good peripheral pulses, good skin color (pink ) and mucous membranes. Lactate clearance  assessed.    Rest per NP/medical resident whose note is outlined above and that I agree with and edited in  full. Care during the described time interval was provided by me and/or other providers on the critical care team.  I have reviewed this patient's available data, including medical history, events of note, physical examination and test results as part of my evaluation  CC time x 68m     Rigoberto Noel. MD

## 2014-04-17 NOTE — Progress Notes (Signed)
UR Completed.  [phone]  

## 2014-04-17 NOTE — Progress Notes (Signed)
CBG rechecked and blood sugar ok

## 2014-04-18 ENCOUNTER — Inpatient Hospital Stay (HOSPITAL_COMMUNITY): Payer: Medicare Other

## 2014-04-18 DIAGNOSIS — I729 Aneurysm of unspecified site: Secondary | ICD-10-CM | POA: Insufficient documentation

## 2014-04-18 DIAGNOSIS — Z8619 Personal history of other infectious and parasitic diseases: Secondary | ICD-10-CM | POA: Insufficient documentation

## 2014-04-18 DIAGNOSIS — A498 Other bacterial infections of unspecified site: Secondary | ICD-10-CM | POA: Insufficient documentation

## 2014-04-18 LAB — BASIC METABOLIC PANEL
Anion gap: 4 — ABNORMAL LOW (ref 5–15)
BUN: 11 mg/dL (ref 6–23)
CHLORIDE: 119 mmol/L — AB (ref 96–112)
CO2: 18 mmol/L — AB (ref 19–32)
CREATININE: 0.64 mg/dL (ref 0.50–1.35)
Calcium: 6.7 mg/dL — ABNORMAL LOW (ref 8.4–10.5)
GFR calc Af Amer: >90 mL/min (ref >90)
GFR calc non Af Amer: 89 mL/min — ABNORMAL LOW (ref >90)
Glucose, Bld: 88 mg/dL (ref 70–99)
Potassium: 3 mmol/L — ABNORMAL LOW (ref 3.5–5.1)
SODIUM: 141 mmol/L (ref 135–145)

## 2014-04-18 LAB — URINE CULTURE
COLONY COUNT: NO GROWTH
CULTURE: NO GROWTH

## 2014-04-18 LAB — CBC
HCT: 22.8 % — ABNORMAL LOW (ref 39.0–52.0)
Hemoglobin: 7.3 g/dL — ABNORMAL LOW (ref 13.0–17.0)
MCH: 26.9 pg (ref 26.0–34.0)
MCHC: 32 g/dL (ref 30.0–36.0)
MCV: 84.1 fL (ref 78.0–100.0)
Platelets: 85 10*3/uL — ABNORMAL LOW (ref 150–400)
RBC: 2.71 MIL/uL — AB (ref 4.22–5.81)
RDW: 17.8 % — ABNORMAL HIGH (ref 11.5–15.5)
WBC: 2.7 10*3/uL — ABNORMAL LOW (ref 4.0–10.5)

## 2014-04-18 LAB — GLUCOSE, CAPILLARY
GLUCOSE-CAPILLARY: 86 mg/dL (ref 70–99)
GLUCOSE-CAPILLARY: 90 mg/dL (ref 70–99)
Glucose-Capillary: 82 mg/dL (ref 70–99)
Glucose-Capillary: 78 mg/dL (ref 70–99)

## 2014-04-18 LAB — CLOSTRIDIUM DIFFICILE BY PCR: Toxigenic C. Difficile by PCR: NEGATIVE

## 2014-04-18 MED ORDER — POTASSIUM CHLORIDE 20 MEQ/15ML (10%) PO SOLN
30.0000 meq | ORAL | Status: AC
Start: 2014-04-18 — End: 2014-04-18
  Administered 2014-04-18 (×2): 30 meq
  Filled 2014-04-18 (×2): qty 30

## 2014-04-18 NOTE — Progress Notes (Signed)
PULMONARY / CRITICAL CARE MEDICINE   Name: [name] MRN: [id number] DOB: 01/08/1930    ADMISSION DATE:  04/17/2014 CONSULTATION DATE:  04/17/14  REFERRING MD :  Dr. Candiss Norse / [facility]   CHIEF COMPLAINT:  Sepsis   INITIAL PRESENTATION: 81 y/o M with PMH of DVT s/p IVC filter, AAA repair with prior cultures positive for pseudomonas & proteus who was admitted 2/3 with reports of fever and concern for sepsis.  PCCM consulted for evaluation.    STUDIES:  12/15  CTA ABD >> stable size of aneurysms, no evidence of endoleak or adjacent abscess collection  12/15  CTA CHEST >> no PE, L infiltrate   SIGNIFICANT EVENTS: 1/26-2/1  Admit for sepsis 2/2 HCAP   HISTORY OF PRESENT ILLNESS:  81 y/o M with PMH of hypertension, BPH, hyperlipidemia, prostate cancer status post radiation, dysphasia/poor PO intake status post PEG placement, DVT s/p IVC Filter (04/2013),  thoracoabdominal aortic aneurysm in July 4098 which was complicated by pseudomonal graft infection (Pseudomonas was sensitive to Ceptaz, Zosyn, gent, Levaquin) and subsequent repeat pseudomonal infection in October 2015 which was pansensitive.  In December 2015 he was seen for a febrile illness and hypotension. At that time CTA of the abdomen and chest showed stable sizes of thoracoabdominal, distal aortic and right common iliac artery aneurysms post endoluminal stenting without evidence of endoleak or abscess collection. At that time his blood cultures grew Proteus (R-Bactrim, Cipro, gent. I-Tobra) and he was treated with meropenem and rifampin. His course at that time was complicated by C. difficile infection for which he has completed treatment and has had subsequent negative C. difficile PCR (04/09/14). He also had a recent admission from 1/26-2/14 sepsis thought related to HCAP.  He was discharged back to a SNF.    The patient presented to the [facility] ER on 2/3 with reports of fever for 48 hours. He was medicated at the nursing home with [facility]  (?) with repeat temperatures of 100.6.  ER evaluation noted initial blood pressures of 90s/ 60s.  Labs notable for Na 136, K 3.6, sr cr 0.95, lactic acid 1.58, WBC 4, hbg 8.2 and platelets 94.  While in the ER the patient developed hypotension which responded to 5 L of normal saline. PC CM consulted for evaluation.      SUBJECTIVE:  Feels better, says he has some stable cough, non-productive  VITAL SIGNS: Temp:  [97.5 F (36.4 C)-98.1 F (36.7 C)] 98 F (36.7 C) (02/04 1140) Pulse Rate:  [77-109] 97 (02/04 1300) Resp:  [9-38] 24 (02/04 1300) BP: (109-156)/(63-97) 134/77 mmHg (02/04 1300) SpO2:  [96 %-100 %] 99 % (02/04 1300) Weight:  [102.4 kg (225 lb 12 oz)] 102.4 kg (225 lb 12 oz) (02/04 0459)   HEMODYNAMICS:     VENTILATOR SETTINGS:     INTAKE / OUTPUT:  Intake/Output Summary (Last 24 hours) at 04/18/14 1316 Last data filed at 04/18/14 1200  Gross per 24 hour  Intake 4238.33 ml  Output   2175 ml  Net 2063.33 ml    PHYSICAL EXAMINATION: General:  Elderly male in NAD Neuro:  awakens, alert / appropriate, moves all ext HEENT:  Mm pink/dry, no jvd Cardiovascular:  s1s2 rrr, 3/6 SEM Lungs:  resp's even/non-labored, lungs bilaterally clear  Abdomen:  NTND, PEG c/d Musculoskeletal:  No acute deformities  Skin:  Warm/dry, LLE > RLE  LABS:  CBC  Recent Labs Lab 04/15/14 0510 04/17/14 0557 04/18/14 0307  WBC 4.4 4.0 2.7*  HGB 8.5* 8.2*  7.3*  HCT 26.6* 25.4* 22.8*  PLT 92* 94* 85*   Coag's  Recent Labs Lab 04/17/14 1228  APTT 36  INR 1.38     BMET  Recent Labs Lab 04/15/14 0510 04/17/14 0404 04/18/14 0307  NA 139 136 141  K 4.1 3.6 3.0*  CL 109 104 119*  CO2 27 24 18*  BUN 19 21 11   CREATININE 0.88 0.95 0.64  GLUCOSE 119* 107* 88   Electrolytes  Recent Labs Lab 04/15/14 0510 04/17/14 0404 04/18/14 0307  CALCIUM 8.6 8.9 6.7*   Sepsis Markers  Recent Labs Lab 04/17/14 0409 04/17/14 1127 04/17/14 1709  LATICACIDVEN 1.58 0.8 1.1    ABG No results for input(s): PHART, PCO2ART, PO2ART in the last 168 hours.   Liver Enzymes  Recent Labs Lab 04/17/14 0404  AST 24  ALT 8  ALKPHOS 58  BILITOT 0.7  ALBUMIN 2.6*   Cardiac Enzymes  Recent Labs Lab 04/17/14 1228  TROPONINI 0.03   Glucose  Recent Labs Lab 04/15/14 1140 04/17/14 1017 04/17/14 1549 04/17/14 1628 04/17/14 1938 04/18/14 0017  GLUCAP 99 89 65* 128* 77 86    Imaging Dg Chest Port 1 View  04/17/2014   CLINICAL DATA:  Fever.  EXAM: PORTABLE CHEST - 1 VIEW  COMPARISON:  04/09/2014  FINDINGS: Cardiomediastinal contours with aortic stent graft in place, unchanged from prior exam. Mildly improved right basilar opacity with residual atelectasis. Unchanged atelectasis at the left lung base. No large pleural effusion or pneumothorax. Osseous structures are stable.  IMPRESSION: Improved right basilar aeration. No new consolidation to suggest progressive or new pneumonia.   Electronically Signed   By: Jeb Levering M.D.   On: 04/17/2014 04:15     ASSESSMENT / PLAN:  INFECTIOUS A:   Sepsis - concern for bacteremia with neg urine, CXR without overt infiltrate Hx C-Diff - neg PCR 1/26 Hx AAA with infected graft - with hx of pseudomonal & proteus bacteremia   P:   BCx2  2/3 >>  UC  2/3 >> negative C-Diff 2/3 >> negative  Vanco, start date 2/3, day 1/x Meropenem, start date 2/3, day 1/x  ID consulted, discussed abx with Dr. Tommy Medal.   Hold Rx for C-Diff >> negative Volume resuscitation  Await cultures as above  Stable for transfer to floor bed as we wait for cx data  CARDIOVASCULAR CVL A:  Sepsis - ? If hypotension compounded by OxyIR administration, probably also some hypovolemia.  Neg lactate reassuring.  Hx HTN, HLD, Combined CHF  AFib/Flutter - noted on ECHO, not an anticoagulation candidate P:  Hold home medications: cardura, metoprolol, zocor Full Code Goal MAP > 65  PULMONARY OETT  A: No acute issues; CXR with stable  interstitial changes, possible small effusions P:   Pulmonary hygiene Mobilize as able  F/U cxr in am to ensure no developing infiltrate  RENAL A:   Hypokalemia Hypocalcemia  BPH P:   Trend BMP Replace electrolytes  Hold detrol   GASTROINTESTINAL A:   Dysphagia s/p PEG - family reports placed for poor PO intake Severe Protein Calorie Malnutrition  Chronic Diarrhea P:   PEG care per protocol  Nutrition consult Continue TF, ? If he would tolerate bolus feedings.  Continuous feedings not allowing for hunger and adequate PO intake.  Hold lactulose & dulcolax, ? Why he is on with hx of chronic diarrhea  HEMATOLOGIC A:   Thrombocytopenia - has been chronic since 12/2013 Hx DVT s/p IVC Filter - DVT in LLE,  remains larger than R on exam P:  Monitor CBC Heparin for DVT prophylaxis  ENDOCRINE A:   Mild Hyperglycemia    P:   Monitor glucose on BMP, if consistently > 180 add SSI  NEUROLOGIC A:   Sedation - post oxy IR P:   RASS goal: n/a Hold sedating medications   FAMILY  - Updates: Family updated at bedside 2/3   - Inter-disciplinary family meet or Palliative Care meeting due by:  2/11.  Pt would like to be a full code for short term intervention only.  He would not want prolonged support.     Baltazar Apo, MD, PhD 04/18/2014, 1:21 PM [facility] [phone] or if no answer [phone]

## 2014-04-18 NOTE — Progress Notes (Signed)
[name] for Infectious Disease    Subjective: "I want to go home when I leave this place"   Antibiotics:  Anti-infectives    Start     Dose/Rate Route Frequency Ordered Stop   04/17/14 1600  vancomycin (VANCOCIN) IVPB 750 mg/150 ml premix     750 mg150 mL/hr over 60 Minutes Intravenous Every 12 hours 04/17/14 0739     04/17/14 0745  meropenem (MERREM) 1 g in sodium chloride 0.9 % 100 mL IVPB     1 g200 mL/hr over 30 Minutes Intravenous 3 times per day 04/17/14 0739     04/17/14 0415  levofloxacin (LEVAQUIN) IVPB 750 mg     750 mg100 mL/hr over 90 Minutes Intravenous  Once 04/17/14 0414 04/17/14 0605   04/17/14 0415  vancomycin (VANCOCIN) IVPB 1000 mg/200 mL premix     1,000 mg200 mL/hr over 60 Minutes Intravenous  Once 04/17/14 0414 04/17/14 0527   04/17/14 0415  aztreonam (AZACTAM) 2 g in dextrose 5 % 50 mL IVPB     2 g100 mL/hr over 30 Minutes Intravenous  Once 04/17/14 0414 04/17/14 0645      Medications: Scheduled Meds: . doxazosin  4 mg Oral QHS  . famotidine  40 mg Per Tube Daily  . feeding supplement (PRO-STAT SUGAR FREE 64)  30 mL Per Tube Q1200  . fesoterodine  4 mg Oral Daily  . free water  100 mL Per Tube 3 times per day  . heparin  5,000 Units Subcutaneous 3 times per day  . lactulose  10 g Oral Daily  . meropenem (MERREM) IV  1 g Intravenous 3 times per day  . methylphenidate  5 mg Oral Daily  . mirtazapine  30 mg Oral QHS  . simvastatin  20 mg Oral q1800  . sodium chloride  3 mL Intravenous Q12H  . vancomycin  750 mg Intravenous Q12H   Continuous Infusions: . sodium chloride 125 mL/hr at 04/17/14 2100  . feeding supplement (JEVITY 1.2 CAL) 1,000 mL (04/18/14 0600)   PRN Meds:.sodium chloride, acetaminophen, albuterol, bisacodyl, guaiFENesin-dextromethorphan, HYDROcodone-acetaminophen, magnesium hydroxide, ondansetron **OR** ondansetron (ZOFRAN) IV, polyethylene glycol, sodium chloride, traMADol    Objective: Weight change: 16 lb 6.6 oz (7.445  kg)  Intake/Output Summary (Last 24 hours) at 04/18/14 1541 Last data filed at 04/18/14 1516  Gross per 24 hour  Intake 4473.33 ml  Output   2375 ml  Net 2098.33 ml   Blood pressure 126/73, pulse 91, temperature 98 F (36.7 C), temperature source Oral, resp. rate 26, height 6\' 2"  (1.88 m), weight 225 lb 12 oz (102.4 kg), SpO2 100 %. Temp:  [97.5 F (36.4 C)-98.1 F (36.7 C)] 98 F (36.7 C) (02/04 1140) Pulse Rate:  [77-109] 91 (02/04 1515) Resp:  [9-38] 26 (02/04 1515) BP: (109-156)/(63-97) 126/73 mmHg (02/04 1515) SpO2:  [96 %-100 %] 100 % (02/04 1515) Weight:  [225 lb 12 oz (102.4 kg)] 225 lb 12 oz (102.4 kg) (02/04 0459)  Physical Exam: General: Alert and awake, oriented x3 HEENT: anicteric sclera, EOMI CVS tachycardic rate, normal r, no murmur rubs or gallops Chest: Fairly clear to auscultation bilaterally, no wheezing, rales or rhonchi Abdomen: soft nontender, surgical site is clean has a feeding tube in place Extremities: no clubbing or edema noted bilaterally Skin: no rashes Neuro: nonfocal,  CBC:  CBC Latest Ref Rng 04/18/2014 04/17/2014 04/15/2014  WBC 4.0 - 10.5 K/uL 2.7(L) 4.0 4.4  Hemoglobin 13.0 - 17.0 g/dL 7.3(L) 8.2(L) 8.5(L)  Hematocrit 39.0 -  52.0 % 22.8(L) 25.4(L) 26.6(L)  Platelets 150 - 400 K/uL 85(L) 94(L) 92(L)       BMET  Recent Labs  04/17/14 0404 04/18/14 0307  NA 136 141  K 3.6 3.0*  CL 104 119*  CO2 24 18*  GLUCOSE 107* 88  BUN 21 11  CREATININE 0.95 0.64  CALCIUM 8.9 6.7*     Liver Panel   Recent Labs  04/17/14 0404  PROT 6.2  ALBUMIN 2.6*  AST 24  ALT 8  ALKPHOS 58  BILITOT 0.7       Sedimentation Rate No results for input(s): ESRSEDRATE in the last 72 hours. C-Reactive Protein No results for input(s): CRP in the last 72 hours.  Micro Results: Recent Results (from the past 720 hour(s))  Blood Culture (routine x 2)     Status: None   Collection Time: 03/23/14  2:41 PM  Result Value Ref Range Status    Specimen Description BLOOD RIGHT ARM  Final   Special Requests BOTTLES DRAWN AEROBIC AND ANAEROBIC 5CC  Final   Culture   Final    NO GROWTH 5 DAYS Performed at [facility]    Report Status 03/29/2014 FINAL  Final  Blood Culture (routine x 2)     Status: None   Collection Time: 03/23/14  2:59 PM  Result Value Ref Range Status   Specimen Description BLOOD LEFT ARM  Final   Special Requests BOTTLES DRAWN AEROBIC AND ANAEROBIC 5CC  Final   Culture   Final    NO GROWTH 5 DAYS Performed at [facility]    Report Status 03/29/2014 FINAL  Final  Urine culture     Status: None   Collection Time: 03/23/14  5:47 PM  Result Value Ref Range Status   Specimen Description URINE, CLEAN CATCH  Final   Special Requests NONE  Final   Colony Count NO GROWTH Performed at [facility]   Final   Culture NO GROWTH Performed at [facility]   Final   Report Status 03/25/2014 FINAL  Final  MRSA PCR Screening     Status: None   Collection Time: 03/23/14  7:16 PM  Result Value Ref Range Status   MRSA by PCR NEGATIVE NEGATIVE Final    Comment:        The GeneXpert MRSA Assay (FDA approved for NASAL specimens only), is one component of a comprehensive MRSA colonization surveillance program. It is not intended to diagnose MRSA infection nor to guide or monitor treatment for MRSA infections.   Clostridium Difficile by PCR     Status: None   Collection Time: 03/23/14  9:06 PM  Result Value Ref Range Status   C difficile by pcr NEGATIVE NEGATIVE Final  Urine culture     Status: None   Collection Time: 04/09/14  6:18 PM  Result Value Ref Range Status   Specimen Description URINE, CLEAN CATCH  Final   Special Requests NONE  Final   Colony Count NO GROWTH Performed at [facility]   Final   Culture NO GROWTH Performed at [facility]   Final   Report Status 04/11/2014 FINAL  Final  Clostridium Difficile by PCR     Status: None   Collection  Time: 04/09/14  6:18 PM  Result Value Ref Range Status   C difficile by pcr NEGATIVE NEGATIVE Final  Stool culture     Status: None   Collection Time: 04/09/14  6:18 PM  Result Value Ref  Range Status   Specimen Description STOOL  Final   Special Requests NONE  Final   Culture   Final    NO SALMONELLA, SHIGELLA, CAMPYLOBACTER, YERSINIA, OR E.COLI [id number]:[id number] ISOLATED Note: REDUCED NORMAL FLORA PRESENT Performed at [facility]    Report Status 04/13/2014 FINAL  Final  Blood Culture (routine x 2)     Status: None   Collection Time: 04/09/14  7:18 PM  Result Value Ref Range Status   Specimen Description BLOOD ARM RIGHT  Final   Special Requests BOTTLES DRAWN AEROBIC AND ANAEROBIC 5CC  Final   Culture   Final    NO GROWTH 5 DAYS Performed at [facility]    Report Status 04/16/2014 FINAL  Final  Blood Culture (routine x 2)     Status: None   Collection Time: 04/09/14  8:23 PM  Result Value Ref Range Status   Specimen Description BLOOD ARM LEFT  Final   Special Requests BOTTLES DRAWN AEROBIC AND ANAEROBIC 5CC  Final   Culture   Final    NO GROWTH 5 DAYS Performed at [facility]    Report Status 04/16/2014 FINAL  Final  MRSA PCR Screening     Status: None   Collection Time: 04/10/14  2:05 AM  Result Value Ref Range Status   MRSA by PCR NEGATIVE NEGATIVE Final    Comment:        The GeneXpert MRSA Assay (FDA approved for NASAL specimens only), is one component of a comprehensive MRSA colonization surveillance program. It is not intended to diagnose MRSA infection nor to guide or monitor treatment for MRSA infections.   Culture, blood (routine x 2)     Status: None (Preliminary result)   Collection Time: 04/17/14  4:04 AM  Result Value Ref Range Status   Specimen Description BLOOD LEFT ARM  Final   Special Requests BOTTLES DRAWN AEROBIC AND ANAEROBIC 4CC EACH  Final   Culture   Final           BLOOD CULTURE RECEIVED NO GROWTH TO DATE CULTURE WILL BE  HELD FOR 5 DAYS BEFORE ISSUING A FINAL NEGATIVE REPORT Performed at [facility]    Report Status PENDING  Incomplete  Culture, blood (routine x 2)     Status: None (Preliminary result)   Collection Time: 04/17/14  4:14 AM  Result Value Ref Range Status   Specimen Description BLOOD RIGHT ARM  Final   Special Requests BOTTLES DRAWN AEROBIC AND ANAEROBIC 10CC EACH  Final   Culture   Final           BLOOD CULTURE RECEIVED NO GROWTH TO DATE CULTURE WILL BE HELD FOR 5 DAYS BEFORE ISSUING A FINAL NEGATIVE REPORT Performed at [facility]    Report Status PENDING  Incomplete  Urine culture     Status: None   Collection Time: 04/17/14  4:39 AM  Result Value Ref Range Status   Specimen Description URINE, CATHETERIZED  Final   Special Requests Immunocompromised  Final   Colony Count NO GROWTH Performed at [facility]   Final   Culture NO GROWTH Performed at [facility]   Final   Report Status 04/18/2014 FINAL  Final  Culture, blood (x 2)     Status: None (Preliminary result)   Collection Time: 04/17/14 12:20 PM  Result Value Ref Range Status   Specimen Description BLOOD LEFT HAND  Final   Special Requests BOTTLES DRAWN AEROBIC ONLY [place]  Final  Culture   Final           BLOOD CULTURE RECEIVED NO GROWTH TO DATE CULTURE WILL BE HELD FOR 5 DAYS BEFORE ISSUING A FINAL NEGATIVE REPORT Performed at [facility]    Report Status PENDING  Incomplete  Culture, blood (x 2)     Status: None (Preliminary result)   Collection Time: 04/17/14 12:28 PM  Result Value Ref Range Status   Specimen Description BLOOD RIGHT ANTECUBITAL  Final   Special Requests BOTTLES DRAWN AEROBIC AND ANAEROBIC 10CC  Final   Culture   Final           BLOOD CULTURE RECEIVED NO GROWTH TO DATE CULTURE WILL BE HELD FOR 5 DAYS BEFORE ISSUING A FINAL NEGATIVE REPORT Performed at [facility]    Report Status PENDING  Incomplete  Culture, sputum-assessment     Status: None    Collection Time: 04/17/14  1:20 PM  Result Value Ref Range Status   Specimen Description SPUTUM  Final   Special Requests NONE  Final   Sputum evaluation   Final    MICROSCOPIC FINDINGS SUGGEST THAT THIS SPECIMEN IS NOT REPRESENTATIVE OF LOWER RESPIRATORY SECRETIONS. PLEASE RECOLLECT. REPORT CALLED TO L.SATTERFIELD,RN 04/17/14 1354 BY BSLADE    Report Status 04/17/2014 FINAL  Final  Culture, expectorated sputum-assessment     Status: None   Collection Time: 04/17/14  5:38 PM  Result Value Ref Range Status   Specimen Description SPUTUM  Final   Special Requests Immunocompromised  Final   Sputum evaluation   Final    MICROSCOPIC FINDINGS SUGGEST THAT THIS SPECIMEN IS NOT REPRESENTATIVE OF LOWER RESPIRATORY SECRETIONS. PLEASE RECOLLECT. CALLED TO R.SARINE,RN AT 2148 BY L.PITT  04/17/14    Report Status 04/17/2014 FINAL  Final  Clostridium Difficile by PCR     Status: None   Collection Time: 04/17/14  9:10 PM  Result Value Ref Range Status   C difficile by pcr NEGATIVE NEGATIVE Final    Studies/Results: Dg Chest Port 1 View  04/18/2014   CLINICAL DATA:  Bilateral airspace disease, history of thoracic aortic aneurysm treated with stent graft  EXAM: PORTABLE CHEST - 1 VIEW  COMPARISON:  Portable chest x-ray of April 17, 2014  FINDINGS: The lungs are better inflated today. The interstitial markings remain increased. The pulmonary vascularity remains engorged. The cardiopericardial silhouette remains enlarged. There are small bilateral pleural effusions layering posteriorly. The bony thorax is unremarkable.  IMPRESSION: Persistent findings of congestive heart failure with pulmonary interstitial edema and small bilateral pleural effusions. Bibasilar subsegmental atelectasis or pneumonia is unchanged.   Electronically Signed   By: David  Martinique   On: 04/18/2014 07:31   Dg Chest Port 1 View  04/17/2014   CLINICAL DATA:  Fever.  EXAM: PORTABLE CHEST - 1 VIEW  COMPARISON:  04/09/2014  FINDINGS:  Cardiomediastinal contours with aortic stent graft in place, unchanged from prior exam. Mildly improved right basilar opacity with residual atelectasis. Unchanged atelectasis at the left lung base. No large pleural effusion or pneumothorax. Osseous structures are stable.  IMPRESSION: Improved right basilar aeration. No new consolidation to suggest progressive or new pneumonia.   Electronically Signed   By: Jeb Levering M.D.   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 male with  thoraco-abdominal aortic aneurysm in July 1017 complicated by Pseudomonas (sensitive to ceftaz, Zosyn, gent, Levaquin) graft infection,. Recent Proteus bacteremia, C difficile colitis now admitted with picture concerning for septic shock again and concern that endograft may be implicated  #1 Sepsis:   --Agree with his broad-spectrum vancomycin and meropenem at present  --Follow-up blood cultures  --Consider repeat CTA of his aneurysm, creatinine is improved  #2 hx of CDI;: Clinically did not have evidence of that on admission and PCR was negative      LOS: 1 day   Alcide Evener 04/18/2014, 3:41 PM

## 2014-04-18 NOTE — Progress Notes (Signed)
[facility] ADULT ICU REPLACEMENT PROTOCOL FOR AM LAB REPLACEMENT ONLY  The patient does apply for the [facility] Adult ICU Electrolyte Replacment Protocol based on the criteria listed below:   1. Is GFR >/= 40 ml/min? Yes.    Patient's GFR today is 89 2. Is urine output >/= 0.5 ml/kg/hr for the last 6 hours? Yes.   Patient's UOP is 0.89 ml/kg/hr 3. Is BUN < 60 mg/dL? Yes.    Patient's BUN today is 11 4. Abnormal electrolyte(s): K 3.0 5. Ordered repletion with: [facility] adult ICU replacement protocol 6. If a panic level lab has been reported, has the CCM MD in charge been notified? Yes.  .   Physician:  Dr. Chesley Mires  [facility], Darrick Huntsman E 04/18/2014 5:10 AM

## 2014-04-18 NOTE — Clinical Documentation Improvement (Addendum)
Abnormal findings (laboratory, x-ray, MRI/CT scans, and other diagnostic results) are not coded and reported unless the physician indicates their clinical significance. If possible, please help by clarifying the specific medical condition related to these abnormal laboratory findings below.  Possible Clinical Conditions:  - anemia (please provide underlying cause if known)  - pancytopenia (please provide underlying cause if known)  - Not clinically significant at this time   Abnormal Lab and/or Testing Results:  Component      WBC RBC Hemoglobin  Latest Ref Rng      4.0 - 10.5 K/uL 4.22 - 5.81 MIL/uL 13.0 - 17.0 g/dL  04/15/2014      4.4 3.13 (L) 8.5 (L)  04/17/2014      4.0 3.04 (L) 8.2 (L)  04/18/2014      2.7 (L) 2.71 (L) 7.3 (L)   Component      HCT  Latest Ref Rng      39.0 - 52.0 %  04/15/2014      26.6 (L)  04/17/2014      25.4 (L)  04/18/2014      22.8 (L)   Component      Platelets  Latest Ref Rng      150 - 400 K/uL  04/15/2014      92 (L)  04/17/2014      94 (L)  04/18/2014      85 (L)    Thank you for your time with this!   Almon Register, RN Clinical Documentation Improvement Specialist ([phone] / [phone]

## 2014-04-18 NOTE — Clinical Social Work Note (Addendum)
Clinical Social Worker received handoff from ED CSW indicating patient is from [facility]. CSW contacted pt's dtr, [name] to introduce self and CSW role. Pt's dtr reported pt has been at [facility] since December 2015 however pt's medical condition is preventing pt from recovering. Pt's dtr further stated pt's family is agreeable to pt's return back to [facility] with strong hopes for his condition to improve.   Pt's dtr pleasant and hopeful for pt's improvement. Pt's dtr stated "I wish dad could just stay out of the hospital and get stronger".   Per progression meeting, pt has orders to transfer to stepdown unit.   CSW will continue to follow pt and pt's family for continued support (Initial psychosocial assessment already completed by ED CSW).  FL-2 on chart for MD signature.   [place]on Axe, MSW, LCSWA [phone] 04/18/2014 3:11 PM

## 2014-04-18 NOTE — Clinical Documentation Improvement (Signed)
Abnormal findings (laboratory, x-ray, MRI/CT scans, and other diagnostic results) are not coded and reported unless the physician indicates their clinical significance. If possible, please help by clarifying the specific medical condition related to these abnormal laboratory findings below.  Abnormal Lab and/or Testing Results:  Component      Potassium  Latest Ref Rng      3.5 - 5.1 mmol/L  04/17/2014      3.6  04/18/2014      3.0 (L)   Treatment provided:  04/18/14 - potassium chloride 20 MEQ/15ML (10%) solution 30 mEq   Thank you for your time with this!   Almon Register, RN Clinical Documentation Improvement Specialist ([phone] / [phone]

## 2014-04-19 ENCOUNTER — Other Ambulatory Visit: Payer: Self-pay

## 2014-04-19 ENCOUNTER — Encounter (HOSPITAL_COMMUNITY): Payer: Self-pay | Admitting: Radiology

## 2014-04-19 ENCOUNTER — Inpatient Hospital Stay (HOSPITAL_COMMUNITY): Payer: Medicare Other

## 2014-04-19 DIAGNOSIS — J810 Acute pulmonary edema: Secondary | ICD-10-CM

## 2014-04-19 DIAGNOSIS — A498 Other bacterial infections of unspecified site: Secondary | ICD-10-CM

## 2014-04-19 DIAGNOSIS — I729 Aneurysm of unspecified site: Secondary | ICD-10-CM

## 2014-04-19 DIAGNOSIS — B964 Proteus (mirabilis) (morganii) as the cause of diseases classified elsewhere: Secondary | ICD-10-CM

## 2014-04-19 LAB — CBC
HCT: 26 % — ABNORMAL LOW (ref 39.0–52.0)
Hemoglobin: 8.4 g/dL — ABNORMAL LOW (ref 13.0–17.0)
MCH: 27.4 pg (ref 26.0–34.0)
MCHC: 32.3 g/dL (ref 30.0–36.0)
MCV: 84.7 fL (ref 78.0–100.0)
Platelets: 116 10*3/uL — ABNORMAL LOW (ref 150–400)
RBC: 3.07 MIL/uL — ABNORMAL LOW (ref 4.22–5.81)
RDW: 17.9 % — AB (ref 11.5–15.5)
WBC: 3.4 10*3/uL — ABNORMAL LOW (ref 4.0–10.5)

## 2014-04-19 LAB — GLUCOSE, CAPILLARY
GLUCOSE-CAPILLARY: 95 mg/dL (ref 70–99)
Glucose-Capillary: 105 mg/dL — ABNORMAL HIGH (ref 70–99)
Glucose-Capillary: 100 mg/dL — ABNORMAL HIGH (ref 70–99)
Glucose-Capillary: 79 mg/dL (ref 70–99)

## 2014-04-19 LAB — BASIC METABOLIC PANEL
ANION GAP: 3 — AB (ref 5–15)
BUN: 11 mg/dL (ref 6–23)
CO2: 26 mmol/L (ref 19–32)
CREATININE: 0.75 mg/dL (ref 0.50–1.35)
Calcium: 8.1 mg/dL — ABNORMAL LOW (ref 8.4–10.5)
Chloride: 111 mmol/L (ref 96–112)
GFR calc Af Amer: >90 mL/min (ref >90)
GFR calc non Af Amer: 83 mL/min — ABNORMAL LOW (ref >90)
GLUCOSE: 120 mg/dL — AB (ref 70–99)
Potassium: 3.9 mmol/L (ref 3.5–5.1)
Sodium: 140 mmol/L (ref 135–145)

## 2014-04-19 LAB — PHOSPHORUS: PHOSPHORUS: 2.7 mg/dL (ref 2.3–4.6)

## 2014-04-19 LAB — MAGNESIUM: Magnesium: 1.8 mg/dL (ref 1.5–2.5)

## 2014-04-19 MED ORDER — POTASSIUM CHLORIDE 20 MEQ/15ML (10%) PO SOLN
40.0000 meq | Freq: Once | ORAL | Status: AC
  Administered 2014-04-19: 40 meq
  Filled 2014-04-19 (×2): qty 30

## 2014-04-19 MED ORDER — DOXAZOSIN MESYLATE 4 MG PO TABS
4.0000 mg | ORAL_TABLET | Freq: Every day | ORAL | Status: DC
  Administered 2014-04-19 – 2014-04-22 (×4): 4 mg
  Filled 2014-04-19 (×5): qty 1

## 2014-04-19 MED ORDER — ALBUTEROL SULFATE (2.5 MG/3ML) 0.083% IN NEBU: 2.5000 mg | INHALATION_SOLUTION | RESPIRATORY_TRACT | Status: DC | PRN

## 2014-04-19 MED ORDER — MIRTAZAPINE 30 MG PO TABS
30.0000 mg | ORAL_TABLET | Freq: Every day | ORAL | Status: DC
  Administered 2014-04-19 – 2014-04-22 (×4): 30 mg
  Filled 2014-04-19 (×5): qty 1

## 2014-04-19 MED ORDER — FAMOTIDINE 20 MG PO TABS: 20.0000 mg | ORAL_TABLET | Freq: Every day | ORAL | Status: DC

## 2014-04-19 MED ORDER — IOHEXOL 350 MG/ML SOLN
100.0000 mL | Freq: Once | INTRAVENOUS | Status: AC | PRN
  Administered 2014-04-19: 100 mL via INTRAVENOUS

## 2014-04-19 MED ORDER — FUROSEMIDE 10 MG/ML IJ SOLN
40.0000 mg | Freq: Once | INTRAMUSCULAR | Status: AC
  Administered 2014-04-19: 40 mg via INTRAVENOUS
  Filled 2014-04-19: qty 4

## 2014-04-19 MED ORDER — HYDROCODONE-ACETAMINOPHEN 5-325 MG PO TABS: 1.0000 | ORAL_TABLET | ORAL | Status: DC | PRN

## 2014-04-19 MED ORDER — SIMVASTATIN 20 MG PO TABS
20.0000 mg | ORAL_TABLET | Freq: Every day | ORAL | Status: DC
  Administered 2014-04-19 – 2014-04-22 (×4): 20 mg
  Filled 2014-04-19 (×4): qty 1

## 2014-04-19 MED ORDER — POTASSIUM CHLORIDE 20 MEQ/15ML (10%) PO SOLN
ORAL | Status: AC
  Filled 2014-04-19: qty 15

## 2014-04-19 MED ORDER — METHYLPHENIDATE HCL 5 MG PO TABS
5.0000 mg | ORAL_TABLET | Freq: Every day | ORAL | Status: DC
  Administered 2014-04-20 – 2014-04-23 (×4): 5 mg
  Filled 2014-04-19 (×4): qty 1

## 2014-04-19 MED ORDER — FAMOTIDINE 20 MG PO TABS
20.0000 mg | ORAL_TABLET | Freq: Every day | ORAL | Status: DC
Start: 2014-04-19 — End: 2014-04-23
  Administered 2014-04-19 – 2014-04-22 (×4): 20 mg
  Filled 2014-04-19 (×5): qty 1

## 2014-04-19 NOTE — Progress Notes (Signed)
ANTIBIOTIC CONSULT NOTE - INITIAL  Pharmacy Consult for meropenem Indication: rule out sepsis  Allergies  Allergen Reactions  . Atorvastatin Other (See Comments)    Causes constipation  . Penicillins Other (See Comments)    Other reaction(s): RASH HIVES     Patient Measurements: Height: 6\' 2"  (188 cm) Weight: 238 lb 15.7 oz (108.4 kg) IBW/kg (Calculated) : 82.2  Vital Signs: Temp: 98.2 F (36.8 C) (02/05 1333) Temp Source: Oral (02/05 1333) BP: 109/68 mmHg (02/05 1333) Pulse Rate: 65 (02/05 1333)  Labs:  Recent Labs  04/17/14 0404 04/17/14 0557 04/18/14 0307 04/19/14 0424  WBC  --  4.0 2.7* 3.4*  HGB  --  8.2* 7.3* 8.4*  PLT  --  94* 85* 116*  CREATININE 0.95  --  0.64 0.75   Estimated Creatinine Clearance: 93.3 mL/min (by C-G formula based on Cr of 0.75).    Microbiology: Recent Results (from the past 720 hour(s))  Blood Culture (routine x 2)     Status: None   Collection Time: 03/23/14  2:41 PM  Result Value Ref Range Status   Specimen Description BLOOD RIGHT ARM  Final   Special Requests BOTTLES DRAWN AEROBIC AND ANAEROBIC 5CC  Final   Culture   Final    NO GROWTH 5 DAYS Performed at [facility]    Report Status 03/29/2014 FINAL  Final  Blood Culture (routine x 2)     Status: None   Collection Time: 03/23/14  2:59 PM  Result Value Ref Range Status   Specimen Description BLOOD LEFT ARM  Final   Special Requests BOTTLES DRAWN AEROBIC AND ANAEROBIC 5CC  Final   Culture   Final    NO GROWTH 5 DAYS Performed at [facility]    Report Status 03/29/2014 FINAL  Final  Urine culture     Status: None   Collection Time: 03/23/14  5:47 PM  Result Value Ref Range Status   Specimen Description URINE, CLEAN CATCH  Final   Special Requests NONE  Final   Colony Count NO GROWTH Performed at [facility]   Final   Culture NO GROWTH Performed at [facility]   Final   Report Status 03/25/2014 FINAL  Final  MRSA PCR Screening      Status: None   Collection Time: 03/23/14  7:16 PM  Result Value Ref Range Status   MRSA by PCR NEGATIVE NEGATIVE Final    Comment:        The GeneXpert MRSA Assay (FDA approved for NASAL specimens only), is one component of a comprehensive MRSA colonization surveillance program. It is not intended to diagnose MRSA infection nor to guide or monitor treatment for MRSA infections.   Clostridium Difficile by PCR     Status: None   Collection Time: 03/23/14  9:06 PM  Result Value Ref Range Status   C difficile by pcr NEGATIVE NEGATIVE Final  Urine culture     Status: None   Collection Time: 04/09/14  6:18 PM  Result Value Ref Range Status   Specimen Description URINE, CLEAN CATCH  Final   Special Requests NONE  Final   Colony Count NO GROWTH Performed at [facility]   Final   Culture NO GROWTH Performed at [facility]   Final   Report Status 04/11/2014 FINAL  Final  Clostridium Difficile by PCR     Status: None   Collection Time: 04/09/14  6:18 PM  Result Value Ref Range Status  C difficile by pcr NEGATIVE NEGATIVE Final  Stool culture     Status: None   Collection Time: 04/09/14  6:18 PM  Result Value Ref Range Status   Specimen Description STOOL  Final   Special Requests NONE  Final   Culture   Final    NO SALMONELLA, SHIGELLA, CAMPYLOBACTER, YERSINIA, OR E.COLI 0157:[id number] ISOLATED Note: REDUCED NORMAL FLORA PRESENT Performed at [facility]    Report Status 04/13/2014 FINAL  Final  Blood Culture (routine x 2)     Status: None   Collection Time: 04/09/14  7:18 PM  Result Value Ref Range Status   Specimen Description BLOOD ARM RIGHT  Final   Special Requests BOTTLES DRAWN AEROBIC AND ANAEROBIC 5CC  Final   Culture   Final    NO GROWTH 5 DAYS Performed at [facility]    Report Status 04/16/2014 FINAL  Final  Blood Culture (routine x 2)     Status: None   Collection Time: 04/09/14  8:23 PM  Result Value Ref Range Status    Specimen Description BLOOD ARM LEFT  Final   Special Requests BOTTLES DRAWN AEROBIC AND ANAEROBIC 5CC  Final   Culture   Final    NO GROWTH 5 DAYS Performed at [facility]    Report Status 04/16/2014 FINAL  Final  MRSA PCR Screening     Status: None   Collection Time: 04/10/14  2:05 AM  Result Value Ref Range Status   MRSA by PCR NEGATIVE NEGATIVE Final    Comment:        The GeneXpert MRSA Assay (FDA approved for NASAL specimens only), is one component of a comprehensive MRSA colonization surveillance program. It is not intended to diagnose MRSA infection nor to guide or monitor treatment for MRSA infections.   Culture, blood (routine x 2)     Status: None (Preliminary result)   Collection Time: 04/17/14  4:04 AM  Result Value Ref Range Status   Specimen Description BLOOD LEFT ARM  Final   Special Requests BOTTLES DRAWN AEROBIC AND ANAEROBIC 4CC EACH  Final   Culture   Final           BLOOD CULTURE RECEIVED NO GROWTH TO DATE CULTURE WILL BE HELD FOR 5 DAYS BEFORE ISSUING A FINAL NEGATIVE REPORT Performed at [facility]    Report Status PENDING  Incomplete  Culture, blood (routine x 2)     Status: None (Preliminary result)   Collection Time: 04/17/14  4:14 AM  Result Value Ref Range Status   Specimen Description BLOOD RIGHT ARM  Final   Special Requests BOTTLES DRAWN AEROBIC AND ANAEROBIC 10CC EACH  Final   Culture   Final           BLOOD CULTURE RECEIVED NO GROWTH TO DATE CULTURE WILL BE HELD FOR 5 DAYS BEFORE ISSUING A FINAL NEGATIVE REPORT Performed at [facility]    Report Status PENDING  Incomplete  Urine culture     Status: None   Collection Time: 04/17/14  4:39 AM  Result Value Ref Range Status   Specimen Description URINE, CATHETERIZED  Final   Special Requests Immunocompromised  Final   Colony Count NO GROWTH Performed at [facility]   Final   Culture NO GROWTH Performed at [facility]   Final   Report Status  04/18/2014 FINAL  Final  Culture, blood (x 2)     Status: None (Preliminary result)   Collection Time:  04/17/14 12:20 PM  Result Value Ref Range Status   Specimen Description BLOOD LEFT HAND  Final   Special Requests BOTTLES DRAWN AEROBIC ONLY [place]  Final   Culture   Final           BLOOD CULTURE RECEIVED NO GROWTH TO DATE CULTURE WILL BE HELD FOR 5 DAYS BEFORE ISSUING A FINAL NEGATIVE REPORT Performed at [facility]    Report Status PENDING  Incomplete  Culture, blood (x 2)     Status: None (Preliminary result)   Collection Time: 04/17/14 12:28 PM  Result Value Ref Range Status   Specimen Description BLOOD RIGHT ANTECUBITAL  Final   Special Requests BOTTLES DRAWN AEROBIC AND ANAEROBIC 10CC  Final   Culture   Final           BLOOD CULTURE RECEIVED NO GROWTH TO DATE CULTURE WILL BE HELD FOR 5 DAYS BEFORE ISSUING A FINAL NEGATIVE REPORT Performed at [facility]    Report Status PENDING  Incomplete  Culture, sputum-assessment     Status: None   Collection Time: 04/17/14  1:20 PM  Result Value Ref Range Status   Specimen Description SPUTUM  Final   Special Requests NONE  Final   Sputum evaluation   Final    MICROSCOPIC FINDINGS SUGGEST THAT THIS SPECIMEN IS NOT REPRESENTATIVE OF LOWER RESPIRATORY SECRETIONS. PLEASE RECOLLECT. REPORT CALLED TO L.SATTERFIELD,RN 04/17/14 1354 BY BSLADE    Report Status 04/17/2014 FINAL  Final  Culture, expectorated sputum-assessment     Status: None   Collection Time: 04/17/14  5:38 PM  Result Value Ref Range Status   Specimen Description SPUTUM  Final   Special Requests Immunocompromised  Final   Sputum evaluation   Final    MICROSCOPIC FINDINGS SUGGEST THAT THIS SPECIMEN IS NOT REPRESENTATIVE OF LOWER RESPIRATORY SECRETIONS. PLEASE RECOLLECT. CALLED TO R.SARINE,RN AT 2148 BY L.PITT  04/17/14    Report Status 04/17/2014 FINAL  Final  Clostridium Difficile by PCR     Status: None   Collection Time: 04/17/14  9:10 PM  Result Value  Ref Range Status   C difficile by pcr NEGATIVE NEGATIVE Final    Assessment: 81yo male with multiple recent admissions presents from NH for fever, concern for sepsis given recent admissions and HCAP, to begin IV ABX; pt has been on vanc and Merrem recently. SCr 0.75, normalized CrCl is 24mL/min. Dr. Tommy Medal discontinued vancomycin this morning d/t no growth on cultures. She was recently treated for pseudomonas.  Goal of Therapy:  Proper dosing based on hepatic and renal function  Plan:  - Merrem 1g IV Q8H - monitor CBC, Cx, SCr, clinical progression  Consepcion Utt D. Windy Dudek, PharmD, BCPS Clinical Pharmacist Pager: [phone] 04/19/2014 2:54 PM

## 2014-04-19 NOTE — Care Management Note (Signed)
  Page 2 of 2   04/23/2014     7:58:23 AM CARE MANAGEMENT NOTE 04/23/2014  Patient:  [name], [name]   Account Number:  [id number]  Date Initiated:  04/17/2014  Documentation initiated by:  [facility]  Subjective/Objective Assessment:   Admitted with fever and hypotension from SNF.     Action/Plan:   Anticipated DC Date:  04/23/2014   Anticipated DC Plan:  SKILLED NURSING FACILITY  In-house referral  Clinical Social Worker      DC Planning Services  CM consult      Choice offered to / List presented to:             Status of service:  In process, will continue to follow Medicare Important Message given?  YES (If response is "NO", the following Medicare IM given date fields will be blank) Date Medicare IM given:  04/19/2014 Medicare IM given by:  Magdalen Spatz Date Additional Medicare IM given:  04/23/2014 Additional Medicare IM given by:  Magdalen Spatz  Discharge Disposition:    Per UR Regulation:  Reviewed for med. necessity/level of care/duration of stay  If discussed at [place] of Stay Meetings, dates discussed:   04/23/2014    Comments:  Contact:  [name] Daughter   [phone]    [facility] Daughter [phone]  [phone]   [name] Spouse [phone]     [name] [phone]    [name] Daughter   [phone]   04-19-14 From [facility]. SW following. Magdalen Spatz RN BNS [phone]   04-23-14 Spoke to patient's daughter [name] late yesterday afternoon . Patient wanting to go home at discharge, [name] insisting patient go to [facility] or another SNF at discharge. [name] concerned patient will be unsafe at home and his wife will be unable to assist him. Explained to [place] patient's choice . [name] and her siblings planned to talk to patient last night and this am and come to an agreement . If patient discharges to home he has used [facility] in the past and would like a hospital bed and 3 in 1 .  Will follow up with patient and family this am. Explained IM from Medicare to [place] in detail. Magdalen Spatz RN BSN

## 2014-04-19 NOTE — Evaluation (Signed)
Clinical/Bedside Swallow Evaluation Patient Details  Name: [name] MRN: [id number] Date of Birth: August 14, 1929  Today's Date: 04/19/2014 Time: SLP Start Time (ACUTE ONLY): 4098 SLP Stop Time (ACUTE ONLY): 1618 SLP Time Calculation (min) (ACUTE ONLY): 33 min  Past Medical History:  Past Medical History  Diagnosis Date  . Hypertension   . BPH (benign prostatic hyperplasia)   . Hyperlipidemia   . Cancer 2008-2009    prostate TREATED WITH RADIATION  . AAA (abdominal aortic aneurysm) 04/2012    STENTING OF AAA IN [place]  . Junctional cardiac arrhythmia     Occurred postoperatively after urologic surgery  . Severe sinus bradycardia     Occurred postoperatively after urologic surgery  . Aortic valve disorders   . Other primary cardiomyopathies   . Dysphagia   . Status post insertion of percutaneous endoscopic gastrostomy (PEG) tube 02/11/14  . Bacteremia due to Pseudomonas   . Infected aortic graft   . DVT (deep venous thrombosis) 04/2013    s/p IVC Filter   Past Surgical History:  Past Surgical History  Procedure Laterality Date  . Abdominal surgery      PART OF COLON REMOVED FOR DIVERTICULITIS  . Appendectomy    . Hernia repair    . Bladder surgery  2008    FOR BLADDER STONE  . Total knee arthroplasty  1990    left  . Abdominal aortic aneurysm repair  04/2012  . Total knee arthroplasty Right 07/26/2012    Procedure: RIGHT TOTAL KNEE ARTHROPLASTY;  Surgeon: Tobi Bastos, MD;  Location: [facility];  Service: Orthopedics;  Laterality: Right;  . Nephrolithotomy Left 04/26/2013    Procedure: LEFT PERCUTANEOUS NEPHROLITHOTOMY ;  Surgeon: [name], MD;  Location: [facility];  Service: Urology;  Laterality: Left;  . Nephrolithotomy Left 05/03/2013    Procedure: 2ND STAGE LEFT PERCUTANEOUS NEPHROLITHOTOMY ;  Surgeon: [name], MD;  Location: [facility];  Service: Urology;  Laterality: Left;  . Esophagogastroduodenoscopy N/A 02/11/2014    JXB:JYNWG hiatal hernia. Patchy erythema of  the gastric mucosa; otherwise negative EGD. Status post 41 French Microvasive PEG tube placement  . Peg placement N/A 02/11/2014    Procedure: PERCUTANEOUS ENDOSCOPIC GASTROSTOMY (PEG) PLACEMENT;  Surgeon: Daneil Dolin, MD;  Location: [facility];  Service: Endoscopy;  Laterality: N/A;   HPI:  81 y/o M with PMH of DVT s/p IVC filter, AAA repair with prior cultures positive for pseudomonas & proteus who was admitted 2/3 with reports of fever and concern for sepsis.  Pt with PEG for supplemental nutrition.  Followed by SLP services during admission 01/23/14 with MBS revealing functional swallow, no penetration/aspiration but highly sensitive gag response when eating.  PEG was placed 02/11/14. Pt states he has lost a significant amount of weight and attributes poor intake to non-fitting dentures.     Assessment / Plan / Recommendation Clinical Impression  Clinical assessment reveals a normal oropharyngeal swallow.  MBS in November of 2015 revealed no penetration or aspiration.  There are no focal CN deficits, biomechanical impairments, or respiratory issues that impede swallowing. An attempted esophagram on 02/04/14 describes pt being reclined at 40 degrees with thin barium being delivered via syringe, after which pt demonstrated "prompt aspiration of a small amount of contrast."   This is the study from which a repeated diagnosis of dysphagia with"gross aspiration" was wrongly culled.  Discussed situation with daughter, [name], who describes great efforts by the family to encourage their father to eat.  [name]  is convinced that he can not eat solid foods with his current ill-fitting dentures.  According to his daughter, his poor PO intake is related to a lack of motivation and his desire for new dentures.  He has been scheduled on several occasions for a fitting to have replacement dentures made, but has missed his appts due to weather and admissions to hospital.    Recommend sending a  soft mechanical diet so that a tray is available if [name] chooses to eat.   Rec dietary establish bolus schedule to help with motivation to eat.  Family is prioritizing rescheduling an appointment to address dentures.  No further SLP f/u is warranted at this time - will sign off.        Diet Recommendation   soft mechanical diet  Liquid Administration via: Straw Medication Administration: Via alternative means Postural Changes and/or Swallow Maneuvers: Seated upright 90 degrees    Other  Recommendations Oral Care Recommendations: Oral care BID    General Date of Onset: 04/19/14 HPI: 81 y/o M with PMH of DVT s/p IVC filter, AAA repair with prior cultures positive for pseudomonas & proteus who was admitted 2/3 with reports of fever and concern for sepsis.  Pt with PEG for supplemental nutrition.  Followed by SLP services during admission 01/23/14 with MBS revealing functional swallow, no penetration/aspiration but highly sensitive gag response when eating.  PEG was placed 02/11/14. Pt states he lost a significant amount of weight, necessitating PEG.    Type of Study: Bedside swallow evaluation Previous Swallow Assessment: MBS 11/15 Diet Prior to this Study: NPO Temperature Spikes Noted: No Respiratory Status: Room air History of Recent Intubation: No Behavior/Cognition: Alert;Cooperative;Pleasant mood Oral Cavity - Dentition: Edentulous (ill-fitting dentures) Self-Feeding Abilities: Able to feed self Patient Positioning: Upright in bed Baseline Vocal Quality: Clear Volitional Cough: Strong Volitional Swallow: Able to elicit    Oral/Motor/Sensory Function Overall Oral Motor/Sensory Function: Appears within functional limits for tasks assessed   Ice Chips     Thin Liquid Thin Liquid: Within functional limits Presentation: Cup;Straw    Nectar Thick Nectar Thick Liquid: Not tested   Honey Thick Honey Thick Liquid: Not tested   Puree Puree: Not tested (declined purees)    Solid  Cristi Gwynn L. [place], [place] CCC/SLP Pager [phone]     Solid: Within functional limits Presentation: Self Fed       Juan Quam Laurice 04/19/2014,4:51 PM

## 2014-04-19 NOTE — Progress Notes (Addendum)
PULMONARY / CRITICAL CARE MEDICINE   Name: [name] MRN: [id number] DOB: 05-Jul-1929    ADMISSION DATE:  04/17/2014 CONSULTATION DATE:  04/17/14  REFERRING MD :  Dr. Candiss Norse / [facility]   CHIEF COMPLAINT:  Sepsis   INITIAL PRESENTATION: 81 y/o M with PMH of DVT s/p IVC filter, AAA repair with prior cultures positive for pseudomonas & proteus who was admitted 2/3 with reports of fever and concern for sepsis.  PCCM consulted for evaluation.    STUDIES:  12/15  CTA ABD >> stable size of aneurysms, no evidence of endoleak or adjacent abscess collection  12/15  CTA CHEST >> no PE, L infiltrate   SIGNIFICANT EVENTS: 1/26-2/1  Admit for sepsis 2/2 HCAP  SUBJECTIVE:  No distress. No new complaints  VITAL SIGNS: Temp:  [97.6 F (36.4 C)-98.2 F (36.8 C)] 98.2 F (36.8 C) (02/05 0500) Pulse Rate:  [87-105] 105 (02/05 0500) Resp:  [20-38] 22 (02/05 0500) BP: (111-160)/(69-91) 160/86 mmHg (02/05 0500) SpO2:  [96 %-100 %] 99 % (02/05 0500) Weight:  [108.4 kg (238 lb 15.7 oz)] 108.4 kg (238 lb 15.7 oz) (02/05 0500)   HEMODYNAMICS:     VENTILATOR SETTINGS:     INTAKE / OUTPUT:  Intake/Output Summary (Last 24 hours) at 04/19/14 1012 Last data filed at 04/19/14 0912  Gross per 24 hour  Intake   2400 ml  Output   1475 ml  Net    925 ml    PHYSICAL EXAMINATION: General:  NAS Neuro:  RASS -1, no focal deficits, + F/C HEENT: WNL Cardiovascular: freq extrasystoles, 2/6 syst M Lungs: clear  Abdomen:  Soft, NT, + BS, G tube site clean Musculoskeletal:  No acute deformities  Skin:  Warm/dry, LLE > RLE  LABS: I have reviewed all of today's lab results. Relevant abnormalities are discussed in the A/P section  CXR: CM, edema pattern  ASSESSMENT / PLAN:  INFECTIOUS A:   Severe sepsis Hx C-Diff - neg PCR 1/26 Hx AAA with infected graft - with hx of pseudomonal & proteus bacteremia   ID service following P:   All micro neg to date Vanc 2/03 >>  Meropenem 2/03 >>   CTA  abd/pelvis ordered per ID ID managing abx  CARDIOVASCULAR CVL A:  Septic shock H/O AAA repair Hx HTN, HLD, Combined CHF PAF- not an anticoagulation candidate P:  Cont home antihypertensive meds Cont statin Recheck EKG  PULMONARY A: Pulm edema pattern on CXR P:   DC NS infusion Lasix (+KCl) X 1  RENAL A:   Hypokalemia Hypocalcemia  BPH P:   Monitor BMET intermittently Monitor I/Os Correct electrolytes as indicated  GASTROINTESTINAL A:   Dysphagia s/p PEG - family reports placed for poor PO intake Severe Protein Calorie Malnutrition  Chronic Diarrhea P:   PEG care per protocol  Nutrition consult Continue TF, ? If he would tolerate bolus feedings.  Continuous feedings not allowing for hunger and adequate PO intake.  Hold lactulose & dulcolax, ? Why he is on with hx of chronic diarrhea  HEMATOLOGIC A:   Anemia without acute blood loss Chronic thrombocytopenia, mild Hx DVT s/p IVC Filter - DVT in LLE, remains larger than R on exam P:  DVT px: SQ heparin Monitor CBC intermittently Transfuse per usual ICU guidelines  ENDOCRINE A:   Mild Hyperglycemia, resolved    P:   Monitor glucose on BMP, if consistently > 180 add SSI  NEUROLOGIC A:   No acute issues P:   Cont home  meds   FAMILY  - Updates: Family updated at bedside 2/3   Pt is DNI per discussion 2/04 with Dr Lamonte Sakai  [facility] to resume care as of AM 2/06 and PCCM to sign off   Merton Border, MD ; [facility] [phone].  After 5:30 PM or weekends, call [phone]

## 2014-04-19 NOTE — Progress Notes (Signed)
[facility]    Subjective: Feels better  Antibiotics:  Anti-infectives    Start     Dose/Rate Route Frequency Ordered Stop   04/17/14 1600  vancomycin (VANCOCIN) IVPB 750 mg/150 ml premix  Status:  Discontinued     750 mg150 mL/hr over 60 Minutes Intravenous Every 12 hours 04/17/14 0739 04/19/14 1007   04/17/14 0745  meropenem (MERREM) 1 g in sodium chloride 0.9 % 100 mL IVPB     1 g200 mL/hr over 30 Minutes Intravenous 3 times per day 04/17/14 0739     04/17/14 0415  levofloxacin (LEVAQUIN) IVPB 750 mg     750 mg100 mL/hr over 90 Minutes Intravenous  Once 04/17/14 0414 04/17/14 0605   04/17/14 0415  vancomycin (VANCOCIN) IVPB 1000 mg/200 mL premix     1,000 mg200 mL/hr over 60 Minutes Intravenous  Once 04/17/14 0414 04/17/14 0527   04/17/14 0415  aztreonam (AZACTAM) 2 g in dextrose 5 % 50 mL IVPB     2 g100 mL/hr over 30 Minutes Intravenous  Once 04/17/14 0414 04/17/14 0645      Medications: Scheduled Meds: . doxazosin  4 mg Per Tube QHS  . famotidine  20 mg Per Tube QHS  . fesoterodine  4 mg Oral Daily  . heparin  5,000 Units Subcutaneous 3 times per day  . meropenem (MERREM) IV  1 g Intravenous 3 times per day  . [START ON 04/20/2014] methylphenidate  5 mg Per Tube Daily  . mirtazapine  30 mg Per Tube QHS  . potassium chloride      . simvastatin  20 mg Per Tube q1800  . sodium chloride  3 mL Intravenous Q12H   Continuous Infusions:   PRN Meds:.sodium chloride, acetaminophen, albuterol, bisacodyl, HYDROcodone-acetaminophen, magnesium hydroxide, [DISCONTINUED] ondansetron **OR** ondansetron (ZOFRAN) IV, sodium chloride    Objective: Weight change: 12 lb 9.1 oz (5.7 kg)  Intake/Output Summary (Last 24 hours) at 04/19/14 1451 Last data filed at 04/19/14 1442  Gross per 24 hour  Intake   1620 ml  Output   3275 ml  Net  -1655 ml   Blood pressure 109/68, pulse 65, temperature 98.2 F (36.8 C), temperature source Oral, resp. rate 22, height  6\' 2"  (1.88 m), weight 238 lb 15.7 oz (108.4 kg), SpO2 91 %. Temp:  [97.6 F (36.4 C)-98.2 F (36.8 C)] 98.2 F (36.8 C) (02/05 1333) Pulse Rate:  [65-105] 65 (02/05 1333) Resp:  [20-26] 22 (02/05 1333) BP: (109-160)/(68-91) 109/68 mmHg (02/05 1333) SpO2:  [91 %-100 %] 91 % (02/05 1333) Weight:  [238 lb 15.7 oz (108.4 kg)] 238 lb 15.7 oz (108.4 kg) (02/05 0500)  Physical Exam: General: Alert and awake, oriented x3 HEENT: anicteric sclera, EOMI CVS tachycardic rate, normal r, no murmur rubs or gallops Chest: Fairly clear to auscultation bilaterally, no wheezing, rales or rhonchi Abdomen: soft nontender, surgical site is clean has a feeding tube in place Extremities: no clubbing or edema noted bilaterally Skin: no rashes, feet are without lesions Neuro: nonfocal,  CBC:  CBC Latest Ref Rng 04/19/2014 04/18/2014 04/17/2014  WBC 4.0 - 10.5 K/uL 3.4(L) 2.7(L) 4.0  Hemoglobin 13.0 - 17.0 g/dL 8.4(L) 7.3(L) 8.2(L)  Hematocrit 39.0 - 52.0 % 26.0(L) 22.8(L) 25.4(L)  Platelets 150 - 400 K/uL 116(L) 85(L) 94(L)       BMET  Recent Labs  04/18/14 0307 04/19/14 0424  NA 141 140  K 3.0* 3.9  CL 119* 111  CO2 18* 26  GLUCOSE  88 120*  BUN 11 11  CREATININE 0.64 0.75  CALCIUM 6.7* 8.1*     Liver Panel   Recent Labs  04/17/14 0404  PROT 6.2  ALBUMIN 2.6*  AST 24  ALT 8  ALKPHOS 58  BILITOT 0.7       Sedimentation Rate No results for input(s): ESRSEDRATE in the last 72 hours. C-Reactive Protein No results for input(s): CRP in the last 72 hours.  Micro Results: Recent Results (from the past 720 hour(s))  Blood Culture (routine x 2)     Status: None   Collection Time: 03/23/14  2:41 PM  Result Value Ref Range Status   Specimen Description BLOOD RIGHT ARM  Final   Special Requests BOTTLES DRAWN AEROBIC AND ANAEROBIC 5CC  Final   Culture   Final    NO GROWTH 5 DAYS Performed at [facility]    Report Status 03/29/2014 FINAL  Final  Blood Culture (routine  x 2)     Status: None   Collection Time: 03/23/14  2:59 PM  Result Value Ref Range Status   Specimen Description BLOOD LEFT ARM  Final   Special Requests BOTTLES DRAWN AEROBIC AND ANAEROBIC 5CC  Final   Culture   Final    NO GROWTH 5 DAYS Performed at [facility]    Report Status 03/29/2014 FINAL  Final  Urine culture     Status: None   Collection Time: 03/23/14  5:47 PM  Result Value Ref Range Status   Specimen Description URINE, CLEAN CATCH  Final   Special Requests NONE  Final   Colony Count NO GROWTH Performed at [facility]   Final   Culture NO GROWTH Performed at [facility]   Final   Report Status 03/25/2014 FINAL  Final  MRSA PCR Screening     Status: None   Collection Time: 03/23/14  7:16 PM  Result Value Ref Range Status   MRSA by PCR NEGATIVE NEGATIVE Final    Comment:        The GeneXpert MRSA Assay (FDA approved for NASAL specimens only), is one component of a comprehensive MRSA colonization surveillance program. It is not intended to diagnose MRSA infection nor to guide or monitor treatment for MRSA infections.   Clostridium Difficile by PCR     Status: None   Collection Time: 03/23/14  9:06 PM  Result Value Ref Range Status   C difficile by pcr NEGATIVE NEGATIVE Final  Urine culture     Status: None   Collection Time: 04/09/14  6:18 PM  Result Value Ref Range Status   Specimen Description URINE, CLEAN CATCH  Final   Special Requests NONE  Final   Colony Count NO GROWTH Performed at [facility]   Final   Culture NO GROWTH Performed at [facility]   Final   Report Status 04/11/2014 FINAL  Final  Clostridium Difficile by PCR     Status: None   Collection Time: 04/09/14  6:18 PM  Result Value Ref Range Status   C difficile by pcr NEGATIVE NEGATIVE Final  Stool culture     Status: None   Collection Time: 04/09/14  6:18 PM  Result Value Ref Range Status   Specimen Description STOOL  Final   Special  Requests NONE  Final   Culture   Final    NO SALMONELLA, SHIGELLA, CAMPYLOBACTER, YERSINIA, OR E.COLI [id number] ISOLATED Note: REDUCED NORMAL FLORA PRESENT Performed at [facility]  Report Status 04/13/2014 FINAL  Final  Blood Culture (routine x 2)     Status: None   Collection Time: 04/09/14  7:18 PM  Result Value Ref Range Status   Specimen Description BLOOD ARM RIGHT  Final   Special Requests BOTTLES DRAWN AEROBIC AND ANAEROBIC 5CC  Final   Culture   Final    NO GROWTH 5 DAYS Performed at [facility]    Report Status 04/16/2014 FINAL  Final  Blood Culture (routine x 2)     Status: None   Collection Time: 04/09/14  8:23 PM  Result Value Ref Range Status   Specimen Description BLOOD ARM LEFT  Final   Special Requests BOTTLES DRAWN AEROBIC AND ANAEROBIC 5CC  Final   Culture   Final    NO GROWTH 5 DAYS Performed at [facility]    Report Status 04/16/2014 FINAL  Final  MRSA PCR Screening     Status: None   Collection Time: 04/10/14  2:05 AM  Result Value Ref Range Status   MRSA by PCR NEGATIVE NEGATIVE Final    Comment:        The GeneXpert MRSA Assay (FDA approved for NASAL specimens only), is one component of a comprehensive MRSA colonization surveillance program. It is not intended to diagnose MRSA infection nor to guide or monitor treatment for MRSA infections.   Culture, blood (routine x 2)     Status: None (Preliminary result)   Collection Time: 04/17/14  4:04 AM  Result Value Ref Range Status   Specimen Description BLOOD LEFT ARM  Final   Special Requests BOTTLES DRAWN AEROBIC AND ANAEROBIC 4CC EACH  Final   Culture   Final           BLOOD CULTURE RECEIVED NO GROWTH TO DATE CULTURE WILL BE HELD FOR 5 DAYS BEFORE ISSUING A FINAL NEGATIVE REPORT Performed at [facility]    Report Status PENDING  Incomplete  Culture, blood (routine x 2)     Status: None (Preliminary result)   Collection Time: 04/17/14  4:14 AM  Result Value  Ref Range Status   Specimen Description BLOOD RIGHT ARM  Final   Special Requests BOTTLES DRAWN AEROBIC AND ANAEROBIC 10CC EACH  Final   Culture   Final           BLOOD CULTURE RECEIVED NO GROWTH TO DATE CULTURE WILL BE HELD FOR 5 DAYS BEFORE ISSUING A FINAL NEGATIVE REPORT Performed at [facility]    Report Status PENDING  Incomplete  Urine culture     Status: None   Collection Time: 04/17/14  4:39 AM  Result Value Ref Range Status   Specimen Description URINE, CATHETERIZED  Final   Special Requests Immunocompromised  Final   Colony Count NO GROWTH Performed at [facility]   Final   Culture NO GROWTH Performed at [facility]   Final   Report Status 04/18/2014 FINAL  Final  Culture, blood (x 2)     Status: None (Preliminary result)   Collection Time: 04/17/14 12:20 PM  Result Value Ref Range Status   Specimen Description BLOOD LEFT HAND  Final   Special Requests BOTTLES DRAWN AEROBIC ONLY [place]  Final   Culture   Final           BLOOD CULTURE RECEIVED NO GROWTH TO DATE CULTURE WILL BE HELD FOR 5 DAYS BEFORE ISSUING A FINAL NEGATIVE REPORT Performed at [facility]    Report Status PENDING  Incomplete  Culture, blood (x 2)     Status: None (Preliminary result)   Collection Time: 04/17/14 12:28 PM  Result Value Ref Range Status   Specimen Description BLOOD RIGHT ANTECUBITAL  Final   Special Requests BOTTLES DRAWN AEROBIC AND ANAEROBIC 10CC  Final   Culture   Final           BLOOD CULTURE RECEIVED NO GROWTH TO DATE CULTURE WILL BE HELD FOR 5 DAYS BEFORE ISSUING A FINAL NEGATIVE REPORT Performed at [facility]    Report Status PENDING  Incomplete  Culture, sputum-assessment     Status: None   Collection Time: 04/17/14  1:20 PM  Result Value Ref Range Status   Specimen Description SPUTUM  Final   Special Requests NONE  Final   Sputum evaluation   Final    MICROSCOPIC FINDINGS SUGGEST THAT THIS SPECIMEN IS NOT REPRESENTATIVE OF LOWER  RESPIRATORY SECRETIONS. PLEASE RECOLLECT. REPORT CALLED TO L.SATTERFIELD,RN 04/17/14 1354 BY BSLADE    Report Status 04/17/2014 FINAL  Final  Culture, expectorated sputum-assessment     Status: None   Collection Time: 04/17/14  5:38 PM  Result Value Ref Range Status   Specimen Description SPUTUM  Final   Special Requests Immunocompromised  Final   Sputum evaluation   Final    MICROSCOPIC FINDINGS SUGGEST THAT THIS SPECIMEN IS NOT REPRESENTATIVE OF LOWER RESPIRATORY SECRETIONS. PLEASE RECOLLECT. CALLED TO R.SARINE,RN AT 2148 BY L.PITT  04/17/14    Report Status 04/17/2014 FINAL  Final  Clostridium Difficile by PCR     Status: None   Collection Time: 04/17/14  9:10 PM  Result Value Ref Range Status   C difficile by pcr NEGATIVE NEGATIVE Final    Studies/Results: Dg Chest Port 1 View  04/19/2014   CLINICAL DATA:  Pulmonary infiltrates.  EXAM: PORTABLE CHEST - 1 VIEW  COMPARISON:  04/18/2014.  FINDINGS: Persistent severe cardiomegaly with bilateral pulmonary infiltrates and right pleural effusion consistent with congestive heart failure appear underlying basilar pneumonia cannot be excluded. Aortic stent graft in stable position. No pneumothorax.  IMPRESSION: 1. Persistent severe cardiomegaly bilateral pulmonary alveolar infiltrates and small right pleural effusion consistent with congestive heart failure bibasilar pneumonia cannot be excluded. 2. Aortic stent graft in stable position.   Electronically Signed   By: Marcello Moores  Register   On: 04/19/2014 07:26   Dg Chest Port 1 View  04/18/2014   CLINICAL DATA:  Bilateral airspace disease, history of thoracic aortic aneurysm treated with stent graft  EXAM: PORTABLE CHEST - 1 VIEW  COMPARISON:  Portable chest x-ray of April 17, 2014  FINDINGS: The lungs are better inflated today. The interstitial markings remain increased. The pulmonary vascularity remains engorged. The cardiopericardial silhouette remains enlarged. There are small bilateral pleural  effusions layering posteriorly. The bony thorax is unremarkable.  IMPRESSION: Persistent findings of congestive heart failure with pulmonary interstitial edema and small bilateral pleural effusions. Bibasilar subsegmental atelectasis or pneumonia is unchanged.   Electronically Signed   By: David  [place]   On: 04/18/2014 07:31   Ct Angio Abd/pel W/ And/or W/o  04/19/2014   CLINICAL DATA:  Diffuse abdominal pain, sepsis and history of prior endovascular thoracoabdominal aneurysm repair in July, 4268 complicated by Pseudomonas graft infection and pseudoaneurysm of the celiac axis.  EXAM: CTA ABDOMEN AND PELVIS WITHOUT CONTRAST  TECHNIQUE: Multidetector CT imaging of the abdomen and pelvis was performed using the standard protocol during bolus administration of intravenous contrast. Multiplanar reconstructed images and MIPs were obtained and reviewed  to evaluate the vascular anatomy.  CONTRAST:  168mL OMNIPAQUE IOHEXOL 350 MG/ML SOLN  COMPARISON:  CTA of the chest, abdomen and pelvis on 02/26/2014 and multiple other imaging studies.  FINDINGS: Complex thoracoabdominal demonstrated endograft placement again noted. Only the lower portion of the descending thoracic component is visualized on the study. The endograft shows stable patency and positioning. Fenestration with stenting of the celiac axis, superior mesenteric artery, bilateral single renal arteries and graft extensions into the right internal and external iliac arteries show normal patency.  Stable soft tissue thickening remains around the celiac axis with stent extension into the common hepatic artery. There no longer is contrast opacification of a separate pseudoaneurysm as depicted by CTA October, 2015. No new pseudoaneurysms identified. No evidence of abnormal air surrounding the endograft. No abnormal fluid collections.  On the arterial phase of imaging, no obvious endoleak is identified. A delayed sequence was not obtained on the current study. Native  right common iliac aneurysm sac remains thrombosed. Distal left common iliac limb and both right internal and external iliac limb landing zones show normal patency and apposition. Native iliac and common femoral arteries showed no significant disease.  The visualized lung bases show small bilateral pleural effusions and bibasilar atelectasis. The liver, gallbladder, spleen and adrenal glands are unremarkable. There is some motion artifact on the study and the arms were also along the side of the body. There may be some subtle stranding in the fat adjacent to the tail of the pancreas. Correlation suggested with any clinical evidence of acute pancreatitis.  Bowel is unremarkable with no evidence of obstruction or thickening. No free air is identified. There is a gastrostomy tube positioned in the body of the stomach. An IVC filter is in place in the infrarenal IVC. The bladder is unremarkable. No hernias are identified. Bony structures show mild spondylosis of the lower thoracic and lumbar spine.  Review of the MIP images confirms the above findings.  IMPRESSION: 1. No further opacification of celiac axis pseudoaneurysm following extend extension into the common hepatic artery. There remains soft tissue thickening around the celiac axis at the level of prior mycotic pseudoaneurysm. No abnormal fluid collection is identified. 2. Stable patency of complex fenestrated thoracoabdominal endograft extending into native iliac arteries bilaterally. 3. Small bilateral pleural effusions. 4. Motion artifact versus potential subtle inflammatory stranding adjacent to the tail of the pancreas. Recommend correlation with any clinical evidence of pancreatitis.   Electronically Signed   By: Aletta Edouard M.D.   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 Recent Proteus bacteremia, C difficile colitis now admitted with picture concerning for septic shock again and concern that endograft may be implicated. The CT done today seems reassuring  #1 Sepsis:   I have stopped his IV vancomycin and continued the meropenem,   If he continues to do well  I would change him back over to [place] but then observe him IN HOUSE ON [facility]   Dr. Baxter Flattery will be covering this weekend and is available for questions.      LOS: 2 days   Alcide Evener 04/19/2014, 2:51 PM

## 2014-04-20 MED ORDER — JEVITY 1.2 CAL PO LIQD
1000.0000 mL | ORAL | Status: DC
  Administered 2014-04-20: 50 mL/h
  Administered 2014-04-21: 1000 mL
  Filled 2014-04-20 (×3): qty 1000

## 2014-04-20 NOTE — Plan of Care (Signed)
Problem: Phase II Progression Outcomes Goal: Progress activity as tolerated unless otherwise ordered Outcome: Progressing Up in chair     

## 2014-04-20 NOTE — Progress Notes (Addendum)
Chart reviewed.   [facility] PROGRESS NOTE  Elijah MOUNGER OIZ:[id number] DOB: Apr 07, 1929 DOA: 04/17/2014 PCP: [name], MD Chart reviewed  Summary 81 y.o. male.with a history of a thoraco-abdominal aortic aneurysm in July 3382 complicated by Pseudomonas (sensitive to ceftaz, Zosyn, gent, Levaquin) graft infection and subsequent pseudomonas (12-16-13, pan-sens). He was started on cefepime at that time and was discharged to skilled nursing facility with plan for ongoing IV antibiotics. His PICC line was removed 12-9 and his antibiotics were changed to by mouth Levaquin He is sent to [facility] on 12-10 with a temp of 103.3 and hypotension In the emergency room he was started on vancomycin and cefepime. His CXR was no acute, his WBC is slightly low at 3.4.  His CT of his graft showed: Stable sizes of thoracoabdominal, distal aortic and RIGHT common iliac artery aneurysms post endoluminal stenting. No evidence of endoleak or adjacent abscess collection.  His BCx grew proteus (R- bactrim, cipro, gent. I - tobra). He was treated with merrem/rifampineen by Dr. Johnnye Sima on 04/09/14 He was on levaquin/rifampin.  He had C diff+ 02-26-14 and had been on vanco 125mg  qid  When seen by Dr. Johnnye Sima on December 26 the patient was hypotensive and tachycardic. He range the patient be seen in the emerge department where C difficile PCR was negative. He was thought at that time to have possible healthcare associated pneumonia with history with IV vancomycin and meropenem and improved clinically and was changed back to oral Levaquin. 2 days after discharge the patient return to the hospital with fever hypotension and concern for recurrent infection.   Admitted this time with shock, likely septic, but normal CXR, blood cultures, UA. Started on vancomycin, merrem. ID consulted. Remained hypotensive despite 4 liters saline, so transferred to ICU.  Hypotension eventually resolved with more fluids.  Transferred to [place] 2/6  Assessment/Plan:  Principal Problem:   Sepsis: cx remain negative to date. Repeat CTA abdomen stable. ID stopped vancomycin. If cx remain negative and patient stable, high dose cipro per ID. 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 Monitor on soft diet and give night time TF   History of Bacteremia due to Pseudomonas   Recurrent bacteremia   CKD (chronic kidney disease), stage III   Bacterial infection due to Proteus mirabilis   Mycotic aneurysm   Pseudomonas infection   Hx of Clostridium difficile infection Deconditioning: back to SNF next week if remains stable  Code Status:  DNI Family Communication:  Daughter by phone Disposition Plan:  SNF next week  Consultants:  ID  PCCM  Procedures:     Antibiotics: Anti-infectives    Start     Dose/Rate Route Frequency Ordered Stop   04/17/14 1600  vancomycin (VANCOCIN) IVPB 750 mg/150 ml premix  Status:  Discontinued     750 mg150 mL/hr over 60 Minutes Intravenous Every 12 hours 04/17/14 0739 04/19/14 1007   04/17/14 0745  meropenem (MERREM) 1 g in sodium chloride 0.9 % 100 mL IVPB     1 g200 mL/hr over 30 Minutes Intravenous 3 times per day 04/17/14 0739     04/17/14 0415  levofloxacin (LEVAQUIN) IVPB 750 mg     750 mg100 mL/hr over 90 Minutes Intravenous  Once 04/17/14 0414 04/17/14 0605   04/17/14 0415  vancomycin (VANCOCIN) IVPB 1000 mg/200 mL  premix     1,000 mg200 mL/hr over 60 Minutes Intravenous  Once 04/17/14 0414 04/17/14 0527   04/17/14 0415  aztreonam (AZACTAM) 2 g in dextrose 5 % 50 mL IVPB     2 g100 mL/hr over 30 Minutes Intravenous  Once 04/17/14 0414 04/17/14 0645      HPI/Subjective: No complaints  Objective: Filed Vitals:   04/20/14 1329  BP: 126/88  Pulse: 101  Temp: 97.8 F (36.6 C)  Resp: 19     Intake/Output Summary (Last 24 hours) at 04/20/14 1444 Last data filed at 04/20/14 0829  Gross per 24 hour  Intake    100 ml  Output   3250 ml  Net  -3150 ml   Filed Weights   04/19/14 0500 04/19/14 1748 04/20/14 0521  Weight: 108.4 kg (238 lb 15.7 oz) 103.42 kg (228 lb) 102.377 kg (225 lb 11.2 oz)    Exam:   General:  Alert, nontoxic. Comfortable. HOH  Cardiovascular: RRR without MGR  Respiratory: CTA without WRR  Abdomen: PEG site ok. S, nt, nd  Ext: no CCE  Basic Metabolic Panel:  Recent Labs Lab 04/14/14 0250 04/15/14 0510 04/17/14 0404 04/18/14 0307 04/19/14 0424  NA 140 139 136 141 140  K 3.7 4.1 3.6 3.0* 3.9  CL 110 109 104 119* 111  CO2 26 27 24  18* 26  GLUCOSE 110* 119* 107* 88 120*  BUN 19 19 21 11 11   CREATININE 0.74 0.88 0.95 0.64 0.75  CALCIUM 8.4 8.6 8.9 6.7* 8.1*  MG  --   --   --   --  1.8  PHOS  --   --   --   --  2.7   Liver Function Tests:  Recent Labs Lab 04/17/14 0404  AST 24  ALT 8  ALKPHOS 58  BILITOT 0.7  PROT 6.2  ALBUMIN 2.6*   No results for input(s): LIPASE, AMYLASE in the last 168 hours. No results for input(s): AMMONIA in the last 168 hours. CBC:  Recent Labs Lab 04/14/14 0250 04/15/14 0510 04/17/14 0557 04/18/14 0307 04/19/14 0424  WBC 3.7* 4.4 4.0 2.7* 3.4*  NEUTROABS  --   --  3.0  --   --   HGB 8.4* 8.5* 8.2* 7.3* 8.4*  HCT 26.3* 26.6* 25.4* 22.8* 26.0*  MCV 85.1 85.0 83.6 84.1 84.7  PLT 81* 92* 94* 85* 116*   Cardiac Enzymes:  Recent Labs Lab 04/17/14 1228  TROPONINI 0.03   BNP (last 3 results)  Recent Labs  04/09/14 1918  BNP 1434.7*    ProBNP (last 3 results) No results for input(s): PROBNP in the last 8760 hours.  CBG:  Recent Labs Lab 04/18/14 2335 04/19/14 0334 04/19/14 0736 04/19/14 1206 04/19/14 1555  GLUCAP 90 105* 100* 95 79    Recent Results (from the past 240 hour(s))  Culture, blood (routine x 2)     Status: None (Preliminary result)   Collection Time:  04/17/14  4:04 AM  Result Value Ref Range Status   Specimen Description BLOOD LEFT ARM  Final   Special Requests BOTTLES DRAWN AEROBIC AND ANAEROBIC 4CC EACH  Final   Culture   Final           BLOOD CULTURE RECEIVED NO GROWTH TO DATE CULTURE WILL BE HELD FOR 5 DAYS BEFORE ISSUING A FINAL NEGATIVE REPORT Performed at [facility]    Report Status PENDING  Incomplete  Culture, blood (routine x 2)     Status: None (Preliminary result)  Collection Time: 04/17/14  4:14 AM  Result Value Ref Range Status   Specimen Description BLOOD RIGHT ARM  Final   Special Requests BOTTLES DRAWN AEROBIC AND ANAEROBIC 10CC EACH  Final   Culture   Final           BLOOD CULTURE RECEIVED NO GROWTH TO DATE CULTURE WILL BE HELD FOR 5 DAYS BEFORE ISSUING A FINAL NEGATIVE REPORT Performed at [facility]    Report Status PENDING  Incomplete  Urine culture     Status: None   Collection Time: 04/17/14  4:39 AM  Result Value Ref Range Status   Specimen Description URINE, CATHETERIZED  Final   Special Requests Immunocompromised  Final   Colony Count NO GROWTH Performed at [facility]   Final   Culture NO GROWTH Performed at [facility]   Final   Report Status 04/18/2014 FINAL  Final  Culture, blood (x 2)     Status: None (Preliminary result)   Collection Time: 04/17/14 12:20 PM  Result Value Ref Range Status   Specimen Description BLOOD LEFT HAND  Final   Special Requests BOTTLES DRAWN AEROBIC ONLY [place]  Final   Culture   Final           BLOOD CULTURE RECEIVED NO GROWTH TO DATE CULTURE WILL BE HELD FOR 5 DAYS BEFORE ISSUING A FINAL NEGATIVE REPORT Performed at [facility]    Report Status PENDING  Incomplete  Culture, blood (x 2)     Status: None (Preliminary result)   Collection Time: 04/17/14 12:28 PM  Result Value Ref Range Status   Specimen Description BLOOD RIGHT ANTECUBITAL  Final   Special Requests BOTTLES DRAWN AEROBIC AND ANAEROBIC 10CC  Final    Culture   Final           BLOOD CULTURE RECEIVED NO GROWTH TO DATE CULTURE WILL BE HELD FOR 5 DAYS BEFORE ISSUING A FINAL NEGATIVE REPORT Performed at [facility]    Report Status PENDING  Incomplete  Culture, sputum-assessment     Status: None   Collection Time: 04/17/14  1:20 PM  Result Value Ref Range Status   Specimen Description SPUTUM  Final   Special Requests NONE  Final   Sputum evaluation   Final    MICROSCOPIC FINDINGS SUGGEST THAT THIS SPECIMEN IS NOT REPRESENTATIVE OF LOWER RESPIRATORY SECRETIONS. PLEASE RECOLLECT. REPORT CALLED TO L.SATTERFIELD,RN 04/17/14 1354 BY BSLADE    Report Status 04/17/2014 FINAL  Final  Culture, expectorated sputum-assessment     Status: None   Collection Time: 04/17/14  5:38 PM  Result Value Ref Range Status   Specimen Description SPUTUM  Final   Special Requests Immunocompromised  Final   Sputum evaluation   Final    MICROSCOPIC FINDINGS SUGGEST THAT THIS SPECIMEN IS NOT REPRESENTATIVE OF LOWER RESPIRATORY SECRETIONS. PLEASE RECOLLECT. CALLED TO R.SARINE,RN AT 2148 BY L.PITT  04/17/14    Report Status 04/17/2014 FINAL  Final  Clostridium Difficile by PCR     Status: None   Collection Time: 04/17/14  9:10 PM  Result Value Ref Range Status   C difficile by pcr NEGATIVE NEGATIVE Final     Studies: Dg Chest Port 1 View  04/19/2014   CLINICAL DATA:  Pulmonary infiltrates.  EXAM: PORTABLE CHEST - 1 VIEW  COMPARISON:  04/18/2014.  FINDINGS: Persistent severe cardiomegaly with bilateral pulmonary infiltrates and right pleural effusion consistent with congestive heart failure appear underlying basilar pneumonia cannot be excluded. Aortic stent graft  in stable position. No pneumothorax.  IMPRESSION: 1. Persistent severe cardiomegaly bilateral pulmonary alveolar infiltrates and small right pleural effusion consistent with congestive heart failure bibasilar pneumonia cannot be excluded. 2. Aortic stent graft in stable position.   Electronically  Signed   By: Marcello Moores  Register   On: 04/19/2014 07:26   Ct Angio Abd/pel W/ And/or W/o  04/19/2014   CLINICAL DATA:  Diffuse abdominal pain, sepsis and history of prior endovascular thoracoabdominal aneurysm repair in July, 1610 complicated by Pseudomonas graft infection and pseudoaneurysm of the celiac axis.  EXAM: CTA ABDOMEN AND PELVIS WITHOUT CONTRAST  TECHNIQUE: Multidetector CT imaging of the abdomen and pelvis was performed using the standard protocol during bolus administration of intravenous contrast. Multiplanar reconstructed images and MIPs were obtained and reviewed to evaluate the vascular anatomy.  CONTRAST:  123mL OMNIPAQUE IOHEXOL 350 MG/ML SOLN  COMPARISON:  CTA of the chest, abdomen and pelvis on 02/26/2014 and multiple other imaging studies.  FINDINGS: Complex thoracoabdominal demonstrated endograft placement again noted. Only the lower portion of the descending thoracic component is visualized on the study. The endograft shows stable patency and positioning. Fenestration with stenting of the celiac axis, superior mesenteric artery, bilateral single renal arteries and graft extensions into the right internal and external iliac arteries show normal patency.  Stable soft tissue thickening remains around the celiac axis with stent extension into the common hepatic artery. There no longer is contrast opacification of a separate pseudoaneurysm as depicted by CTA October, 2015. No new pseudoaneurysms identified. No evidence of abnormal air surrounding the endograft. No abnormal fluid collections.  On the arterial phase of imaging, no obvious endoleak is identified. A delayed sequence was not obtained on the current study. Native right common iliac aneurysm sac remains thrombosed. Distal left common iliac limb and both right internal and external iliac limb landing zones show normal patency and apposition. Native iliac and common femoral arteries showed no significant disease.  The visualized lung bases  show small bilateral pleural effusions and bibasilar atelectasis. The liver, gallbladder, spleen and adrenal glands are unremarkable. There is some motion artifact on the study and the arms were also along the side of the body. There may be some subtle stranding in the fat adjacent to the tail of the pancreas. Correlation suggested with any clinical evidence of acute pancreatitis.  Bowel is unremarkable with no evidence of obstruction or thickening. No free air is identified. There is a gastrostomy tube positioned in the body of the stomach. An IVC filter is in place in the infrarenal IVC. The bladder is unremarkable. No hernias are identified. Bony structures show mild spondylosis of the lower thoracic and lumbar spine.  Review of the MIP images confirms the above findings.  IMPRESSION: 1. No further opacification of celiac axis pseudoaneurysm following extend extension into the common hepatic artery. There remains soft tissue thickening around the celiac axis at the level of prior mycotic pseudoaneurysm. No abnormal fluid collection is identified. 2. Stable patency of complex fenestrated thoracoabdominal endograft extending into native iliac arteries bilaterally. 3. Small bilateral pleural effusions. 4. Motion artifact versus potential subtle inflammatory stranding adjacent to the tail of the pancreas. Recommend correlation with any clinical evidence of pancreatitis.   Electronically Signed   By: Aletta Edouard M.D.   On: 04/19/2014 14:00    Scheduled Meds: . doxazosin  4 mg Per Tube QHS  . famotidine  20 mg Per Tube QHS  . fesoterodine  4 mg Oral Daily  . heparin  5,000  Units Subcutaneous 3 times per day  . meropenem (MERREM) IV  1 g Intravenous 3 times per day  . methylphenidate  5 mg Per Tube Daily  . mirtazapine  30 mg Per Tube QHS  . simvastatin  20 mg Per Tube q1800  . sodium chloride  3 mL Intravenous Q12H   Continuous Infusions:   Time spent: 35 minutes  [place]  [facility] Pager [phone]. If 7PM-7AM, please contact night-coverage at [email or web address], password [facility] 04/20/2014, 2:44 PM  LOS: 3 days

## 2014-04-21 LAB — CBC
HCT: 27.6 % — ABNORMAL LOW (ref 39.0–52.0)
Hemoglobin: 8.9 g/dL — ABNORMAL LOW (ref 13.0–17.0)
MCH: 27.1 pg (ref 26.0–34.0)
MCHC: 32.2 g/dL (ref 30.0–36.0)
MCV: 84.1 fL (ref 78.0–100.0)
Platelets: 137 10*3/uL — ABNORMAL LOW (ref 150–400)
RBC: 3.28 MIL/uL — ABNORMAL LOW (ref 4.22–5.81)
RDW: 17.8 % — AB (ref 11.5–15.5)
WBC: 3.8 10*3/uL — ABNORMAL LOW (ref 4.0–10.5)

## 2014-04-21 LAB — GLUCOSE, CAPILLARY
GLUCOSE-CAPILLARY: 111 mg/dL — AB (ref 70–99)
GLUCOSE-CAPILLARY: 79 mg/dL (ref 70–99)
Glucose-Capillary: 113 mg/dL — ABNORMAL HIGH (ref 70–99)
Glucose-Capillary: 106 mg/dL — ABNORMAL HIGH (ref 70–99)

## 2014-04-21 NOTE — Progress Notes (Signed)
Healthcare and [place] placed in Chart. Melford Aase, RN

## 2014-04-21 NOTE — Clinical Social Work Note (Signed)
CSW made aware patient not ready for d/c on this date. CSW to follow tomorrow.  [place], [place] Weekend Clinical Social Worker [phone]

## 2014-04-21 NOTE — Progress Notes (Signed)
[facility] PROGRESS NOTE  [name] FGH:[id number] DOB: March 12, 1930 DOA: 04/17/2014 PCP: [name], MD Chart reviewed  Summary 81 y.o. male.with a history of a thoraco-abdominal aortic aneurysm in July 6789 complicated by Pseudomonas (sensitive to ceftaz, Zosyn, gent, Levaquin) graft infection and subsequent pseudomonas (12-16-13, pan-sens). He was started on cefepime at that time and was discharged to skilled nursing facility with plan for ongoing IV antibiotics. His PICC line was removed 12-9 and his antibiotics were changed to by mouth Levaquin He is sent to [facility] on 12-10 with a temp of 103.3 and hypotension In the emergency room he was started on vancomycin and cefepime. His CXR was no acute, his WBC is slightly low at 3.4.  His CT of his graft showed: Stable sizes of thoracoabdominal, distal aortic and RIGHT common iliac artery aneurysms post endoluminal stenting. No evidence of endoleak or adjacent abscess collection.  His BCx grew proteus (R- bactrim, cipro, gent. I - tobra). He was treated with merrem/rifampineen by Dr. Johnnye Sima on 04/09/14 He was on levaquin/rifampin.  He had C diff+ 02-26-14 and had been on vanco 125mg  qid  When seen by Dr. Johnnye Sima on December 26 the patient was hypotensive and tachycardic. He range the patient be seen in the emerge department where C difficile PCR was negative. He was thought at that time to have possible healthcare associated pneumonia with history with IV vancomycin and meropenem and improved clinically and was changed back to oral Levaquin. 2 days after discharge the patient return to the hospital with fever hypotension and concern for recurrent infection.   Admitted this time with shock, likely septic, but normal CXR, blood cultures, UA. Started on vancomycin, merrem. ID consulted. Remained hypotensive despite 4 liters saline, so transferred to ICU.  Hypotension eventually resolved with more fluids. Transferred to [place]  2/6  Assessment/Plan:  Principal Problem:   Sepsis: cx remain negative to date. Repeat CTA abdomen stable. ID stopped vancomycin. If cx remain negative and patient stable, high dose cipro per Dr. Tommy Medal.  Discussed with Dr. Baxter Flattery. She will discuss plan with Dr. Tommy Medal. Concern regarding recurrence of C diff. Continue meropenum for now. 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 Monitor on soft diet and give night time TF. Per RN, not eating much.   History of Bacteremia due to Pseudomonas   Recurrent bacteremia   CKD (chronic kidney disease), stage III   Bacterial infection due to Proteus mirabilis   Mycotic aneurysm   Pseudomonas infection   Hx of Clostridium difficile infection Deconditioning: back to SNF soon if stable  Code Status:  DNI Family Communication:  Daughter by phone Disposition Plan:  SNF next week  Consultants:  ID  PCCM  Procedures:     Antibiotics: Anti-infectives    Start     Dose/Rate Route Frequency Ordered Stop   04/17/14 1600  vancomycin (VANCOCIN) IVPB 750 mg/150 ml premix  Status:  Discontinued     750 mg150 mL/hr over 60 Minutes Intravenous Every 12 hours 04/17/14 0739 04/19/14 1007   04/17/14 0745  meropenem (MERREM) 1 g in sodium chloride 0.9 % 100 mL IVPB     1 g200 mL/hr over 30 Minutes Intravenous 3 times per day 04/17/14 0739     04/17/14 0415  levofloxacin (LEVAQUIN) IVPB 750 mg  750 mg100 mL/hr over 90 Minutes Intravenous  Once 04/17/14 0414 04/17/14 0605   04/17/14 0415  vancomycin (VANCOCIN) IVPB 1000 mg/200 mL premix     1,000 mg200 mL/hr over 60 Minutes Intravenous  Once 04/17/14 0414 04/17/14 0527   04/17/14 0415  aztreonam (AZACTAM) 2 g in dextrose 5 % 50 mL IVPB     2 g100 mL/hr over 30 Minutes Intravenous  Once 04/17/14 0414 04/17/14 0645       HPI/Subjective: Wants to go home, not SNF  Objective: Filed Vitals:   04/21/14 1331  BP: 115/83  Pulse: 91  Temp: 97.5 F (36.4 C)  Resp: 18    Intake/Output Summary (Last 24 hours) at 04/21/14 1606 Last data filed at 04/21/14 1103  Gross per 24 hour  Intake    120 ml  Output   1580 ml  Net  -1460 ml   Filed Weights   04/19/14 1748 04/20/14 0521 04/21/14 0626  Weight: 103.42 kg (228 lb) 102.377 kg (225 lb 11.2 oz) 99.791 kg (220 lb)    Exam:   General:  Alert, nontoxic. Comfortable. HOH  Cardiovascular: RRR without MGR  Respiratory: CTA without WRR  Abdomen: PEG site ok. S, nt, nd  Ext: no CCE  Basic Metabolic Panel:  Recent Labs Lab 04/15/14 0510 04/17/14 0404 04/18/14 0307 04/19/14 0424  NA 139 136 141 140  K 4.1 3.6 3.0* 3.9  CL 109 104 119* 111  CO2 27 24 18* 26  GLUCOSE 119* 107* 88 120*  BUN 19 21 11 11   CREATININE 0.88 0.95 0.64 0.75  CALCIUM 8.6 8.9 6.7* 8.1*  MG  --   --   --  1.8  PHOS  --   --   --  2.7   Liver Function Tests:  Recent Labs Lab 04/17/14 0404  AST 24  ALT 8  ALKPHOS 58  BILITOT 0.7  PROT 6.2  ALBUMIN 2.6*   No results for input(s): LIPASE, AMYLASE in the last 168 hours. No results for input(s): AMMONIA in the last 168 hours. CBC:  Recent Labs Lab 04/15/14 0510 04/17/14 0557 04/18/14 0307 04/19/14 0424 04/21/14 0447  WBC 4.4 4.0 2.7* 3.4* 3.8*  NEUTROABS  --  3.0  --   --   --   HGB 8.5* 8.2* 7.3* 8.4* 8.9*  HCT 26.6* 25.4* 22.8* 26.0* 27.6*  MCV 85.0 83.6 84.1 84.7 84.1  PLT 92* 94* 85* 116* 137*   Cardiac Enzymes:  Recent Labs Lab 04/17/14 1228  TROPONINI 0.03   BNP (last 3 results)  Recent Labs  04/09/14 1918  BNP 1434.7*    ProBNP (last 3 results) No results for input(s): PROBNP in the last 8760 hours.  CBG:  Recent Labs Lab 04/19/14 0736 04/19/14 1206 04/19/14 1555 04/21/14 0748 04/21/14 1153  GLUCAP 100* 95 79 113* 111*    Recent Results (from the past 240 hour(s))   Culture, blood (routine x 2)     Status: None (Preliminary result)   Collection Time: 04/17/14  4:04 AM  Result Value Ref Range Status   Specimen Description BLOOD LEFT ARM  Final   Special Requests BOTTLES DRAWN AEROBIC AND ANAEROBIC 4CC EACH  Final   Culture   Final           BLOOD CULTURE RECEIVED NO GROWTH TO DATE CULTURE WILL BE HELD FOR 5 DAYS BEFORE ISSUING A FINAL NEGATIVE REPORT Performed at [facility]    Report Status PENDING  Incomplete  Culture, blood (routine  x 2)     Status: None (Preliminary result)   Collection Time: 04/17/14  4:14 AM  Result Value Ref Range Status   Specimen Description BLOOD RIGHT ARM  Final   Special Requests BOTTLES DRAWN AEROBIC AND ANAEROBIC 10CC EACH  Final   Culture   Final           BLOOD CULTURE RECEIVED NO GROWTH TO DATE CULTURE WILL BE HELD FOR 5 DAYS BEFORE ISSUING A FINAL NEGATIVE REPORT Performed at [facility]    Report Status PENDING  Incomplete  Urine culture     Status: None   Collection Time: 04/17/14  4:39 AM  Result Value Ref Range Status   Specimen Description URINE, CATHETERIZED  Final   Special Requests Immunocompromised  Final   Colony Count NO GROWTH Performed at [facility]   Final   Culture NO GROWTH Performed at [facility]   Final   Report Status 04/18/2014 FINAL  Final  Culture, blood (x 2)     Status: None (Preliminary result)   Collection Time: 04/17/14 12:20 PM  Result Value Ref Range Status   Specimen Description BLOOD LEFT HAND  Final   Special Requests BOTTLES DRAWN AEROBIC ONLY [place]  Final   Culture   Final           BLOOD CULTURE RECEIVED NO GROWTH TO DATE CULTURE WILL BE HELD FOR 5 DAYS BEFORE ISSUING A FINAL NEGATIVE REPORT Performed at [facility]    Report Status PENDING  Incomplete  Culture, blood (x 2)     Status: None (Preliminary result)   Collection Time: 04/17/14 12:28 PM  Result Value Ref Range Status   Specimen Description BLOOD RIGHT  ANTECUBITAL  Final   Special Requests BOTTLES DRAWN AEROBIC AND ANAEROBIC 10CC  Final   Culture   Final           BLOOD CULTURE RECEIVED NO GROWTH TO DATE CULTURE WILL BE HELD FOR 5 DAYS BEFORE ISSUING A FINAL NEGATIVE REPORT Performed at [facility]    Report Status PENDING  Incomplete  Culture, sputum-assessment     Status: None   Collection Time: 04/17/14  1:20 PM  Result Value Ref Range Status   Specimen Description SPUTUM  Final   Special Requests NONE  Final   Sputum evaluation   Final    MICROSCOPIC FINDINGS SUGGEST THAT THIS SPECIMEN IS NOT REPRESENTATIVE OF LOWER RESPIRATORY SECRETIONS. PLEASE RECOLLECT. REPORT CALLED TO L.SATTERFIELD,RN 04/17/14 1354 BY BSLADE    Report Status 04/17/2014 FINAL  Final  Culture, expectorated sputum-assessment     Status: None   Collection Time: 04/17/14  5:38 PM  Result Value Ref Range Status   Specimen Description SPUTUM  Final   Special Requests Immunocompromised  Final   Sputum evaluation   Final    MICROSCOPIC FINDINGS SUGGEST THAT THIS SPECIMEN IS NOT REPRESENTATIVE OF LOWER RESPIRATORY SECRETIONS. PLEASE RECOLLECT. CALLED TO R.SARINE,RN AT 2148 BY L.PITT  04/17/14    Report Status 04/17/2014 FINAL  Final  Clostridium Difficile by PCR     Status: None   Collection Time: 04/17/14  9:10 PM  Result Value Ref Range Status   C difficile by pcr NEGATIVE NEGATIVE Final     Studies: No results found.  Scheduled Meds: . doxazosin  4 mg Per Tube QHS  . famotidine  20 mg Per Tube QHS  . feeding supplement (JEVITY 1.2 CAL)  1,000 mL Per Tube Q24H  . fesoterodine  4  mg Oral Daily  . heparin  5,000 Units Subcutaneous 3 times per day  . meropenem (MERREM) IV  1 g Intravenous 3 times per day  . methylphenidate  5 mg Per Tube Daily  . mirtazapine  30 mg Per Tube QHS  . simvastatin  20 mg Per Tube q1800  . sodium chloride  3 mL Intravenous Q12H   Continuous Infusions:   Time spent: 25 minutes  [place]  [facility] [email or web address], password [facility] 04/21/2014, 4:06 PM  LOS: 4 days

## 2014-04-21 NOTE — Progress Notes (Signed)
Physical Therapy Evaluation Patient Details Name: [name] MRN: [id number] DOB: 08-06-1929 Today's Date: 04/21/2014   History of Present Illness  Patient is an 81 yo male admitted 04/17/14 with sepsis, recurrent/chronic AAA graft infection.  PMH:  HTN, AAA graft, cardiomyopathy, dysphagia, PEG, mult abdominal surgeries, CHF  Clinical Impression  Patient presents with problems listed below.  Will benefit from acute PT to maximize independence prior to discharge.  Recommend return to SNF at discharge for continued therapy.    Follow Up Recommendations SNF;Supervision/Assistance - 24 hour    Equipment Recommendations  None recommended by PT    Recommendations for Other Services       Precautions / Restrictions Precautions Precautions: Fall Precaution Comments: Has PEG tube Restrictions Weight Bearing Restrictions: No      Mobility  Bed Mobility Overal bed mobility: Needs Assistance Bed Mobility: Supine to Sit     Supine to sit: Min assist     General bed mobility comments: Assist to raise trunk to sitting position.  Once upright, patient with good sitting balance.  Transfers Overall transfer level: Needs assistance Equipment used: 2 person hand held assist Transfers: Sit to/from [facility] Sit to Stand: Min assist;+2 physical assistance Stand pivot transfers: Min assist;+2 physical assistance       General transfer comment: Verbal cues for technique.  Assist to rise to standing from bed.  Patient able to take several shuffle steps to pivot to chair.  Ambulation/Gait             General Gait Details: Patient declined to attempt ambulation today.  Stairs            Wheelchair Mobility    Modified Rankin (Stroke Patients Only)       Balance Overall balance assessment: Needs assistance Sitting-balance support: No upper extremity supported;Feet supported Sitting balance-Leahy Scale: Fair     Standing balance support: Bilateral  upper extremity supported Standing balance-Leahy Scale: Poor                               Pertinent Vitals/Pain Pain Assessment: No/denies pain    Home Living Family/patient expects to be discharged to:: Skilled nursing facility                 Additional Comments: Patient is from [facility]    Prior Function Level of Independence: Needs assistance   Gait / Transfers Assistance Needed: Minimal ambulation at SNF.  Mostly transfer level per patient  ADL's / Homemaking Assistance Needed: Assist required for ADL's        Hand Dominance   Dominant Hand: Right    Extremity/Trunk Assessment   Upper Extremity Assessment: Generalized weakness           Lower Extremity Assessment: Generalized weakness      Cervical / Trunk Assessment: Kyphotic  Communication   Communication: HOH  Cognition Arousal/Alertness: Awake/alert Behavior During Therapy: Flat affect Overall Cognitive Status: No family/caregiver present to determine baseline cognitive functioning                      General Comments      Exercises        Assessment/Plan    PT Assessment Patient needs continued PT services  PT Diagnosis Difficulty walking;Generalized weakness   PT Problem List Decreased strength;Decreased activity tolerance;Decreased balance;Decreased mobility;Decreased knowledge of use of DME  PT Treatment Interventions DME instruction;Gait training;Functional mobility training;Therapeutic activities;Therapeutic  exercise;Patient/family education   PT Goals (Current goals can be found in the Care Plan section) Acute Rehab PT Goals Patient Stated Goal: None stated    Frequency Min 2X/week   Barriers to discharge        Co-evaluation               End of Session Equipment Utilized During Treatment: Gait belt Activity Tolerance: Patient limited by fatigue Patient left: in chair;with call bell/phone within reach Nurse Communication: Mobility  status (Patient in recliner)         Time: 1018-1030 PT Time Calculation (min) (ACUTE ONLY): 12 min   Charges:   PT Evaluation $Initial PT Evaluation Tier I: 1 Procedure     PT G CodesDespina [name] 05-06-2014, 2:09 PM [name]. [name], [place] Pager [phone]

## 2014-04-22 DIAGNOSIS — Z8619 Personal history of other infectious and parasitic diseases: Secondary | ICD-10-CM

## 2014-04-22 DIAGNOSIS — R9431 Abnormal electrocardiogram [ECG] [EKG]: Secondary | ICD-10-CM | POA: Insufficient documentation

## 2014-04-22 LAB — GLUCOSE, CAPILLARY
GLUCOSE-CAPILLARY: 108 mg/dL — AB (ref 70–99)
GLUCOSE-CAPILLARY: 127 mg/dL — AB (ref 70–99)
Glucose-Capillary: 92 mg/dL (ref 70–99)

## 2014-04-22 MED ORDER — CIPROFLOXACIN HCL 750 MG PO TABS
750.0000 mg | ORAL_TABLET | Freq: Two times a day (BID) | ORAL | Status: DC
  Administered 2014-04-22 – 2014-04-23 (×3): 750 mg via ORAL
  Filled 2014-04-22 (×5): qty 1

## 2014-04-22 MED ORDER — JEVITY 1.2 CAL PO LIQD
1000.0000 mL | ORAL | Status: DC
  Administered 2014-04-22 – 2014-04-23 (×2): 1000 mL
  Filled 2014-04-22 (×2): qty 1000

## 2014-04-22 NOTE — Progress Notes (Signed)
NUTRITION FOLLOW UP / CONSULT  Intervention:    Continue nocturnal TF regimen with Jevity 1.2, increase goal rate to 130 ml/h from 6 PM to 6 AM to provide 1872 kcals, 87 gm protein, 1264 ml free water daily. This will meet 94% of estimated kcal needs and 87% of protein needs.  Continue PO diet during the day, soft foods and liquids that are easy to consume.  Nutrition Dx:   Inadequate oral intake related to decreased motivation and ill-fitting dentures as evidenced by minimal intake of meals. Ongoing.  Goal:   Intake to meet >90% of estimated nutrition needs. Unmet.  Monitor:   TF tolerance/adequacy, weight trend, labs, adequacy of PO intake.  Assessment:   Patient admitted to the hospital on 2/3 with fever and concerns for sepsis.   PEG in place for TF administration. Per review of progress notes, patient has been eating very poorly due to ill-fitting dentures. S/P bedside swallow evaluation with SLP on 2/5, recommends mechanical soft diet with thin liquids. Diet has been advanced. Patient is consuming 0-10% of meals. Drinking some liquids.  Patient unhappy that he does not have his dentures. He thinks he'll be able to eat well when he gets new dentures. He is ready to go home. He does not want to try pureed foods or PO supplements while he is here.   Received MD Consult for TF initiation and management. Patient is currently receiving Jevity 1.2 via PEG at 50 ml/h x 12 hours nocturnally (600 ml/day) to provide 720 kcals, 33 gm protein, 486 ml free water daily.    Height: Ht Readings from Last 1 Encounters:  04/17/14 6\' 2"  (1.88 m)    Weight Status:   Wt Readings from Last 1 Encounters:  04/22/14 218 lb 3.2 oz (98.975 kg)    Re-estimated needs:  Kcal: 2000-2200 Protein: 100-120 gm Fluid: 2-2.2 L  Skin: WDL  Diet Order: DIET SOFT   0-10% meal completion   Intake/Output Summary (Last 24 hours) at 04/22/14 1114 Last data filed at 04/22/14 0853  Gross per 24 hour   Intake    236 ml  Output   1350 ml  Net  -1114 ml    Last BM: 2/8   Labs:   Recent Labs Lab 04/17/14 0404 04/18/14 0307 04/19/14 0424  NA 136 141 140  K 3.6 3.0* 3.9  CL 104 119* 111  CO2 24 18* 26  BUN 21 11 11   CREATININE 0.95 0.64 0.75  CALCIUM 8.9 6.7* 8.1*  MG  --   --  1.8  PHOS  --   --  2.7  GLUCOSE 107* 88 120*    CBG (last 3)   Recent Labs  04/22/14 0041 04/22/14 0406 04/22/14 0728  GLUCAP 92 108* 127*    Scheduled Meds: . doxazosin  4 mg Per Tube QHS  . famotidine  20 mg Per Tube QHS  . feeding supplement (JEVITY 1.2 CAL)  1,000 mL Per Tube Q24H  . fesoterodine  4 mg Oral Daily  . heparin  5,000 Units Subcutaneous 3 times per day  . meropenem (MERREM) IV  1 g Intravenous 3 times per day  . methylphenidate  5 mg Per Tube Daily  . mirtazapine  30 mg Per Tube QHS  . simvastatin  20 mg Per Tube q1800  . sodium chloride  3 mL Intravenous Q12H    Continuous Infusions:    Molli Barrows, RD, LDN, [place] Pager [phone] After Hours Pager [phone]

## 2014-04-22 NOTE — Progress Notes (Signed)
[place] for Infectious Disease    Subjective: Feels better wants to go home  Antibiotics:  Anti-infectives    Start     Dose/Rate Route Frequency Ordered Stop   04/22/14 1300  ciprofloxacin (CIPRO) tablet 750 mg     750 mg Oral 2 times daily 04/22/14 1200     04/17/14 1600  vancomycin (VANCOCIN) IVPB 750 mg/150 ml premix  Status:  Discontinued     750 mg150 mL/hr over 60 Minutes Intravenous Every 12 hours 04/17/14 0739 04/19/14 1007   04/17/14 0745  meropenem (MERREM) 1 g in sodium chloride 0.9 % 100 mL IVPB  Status:  Discontinued     1 g200 mL/hr over 30 Minutes Intravenous 3 times per day 04/17/14 0739 04/22/14 1157   04/17/14 0415  levofloxacin (LEVAQUIN) IVPB 750 mg     750 mg100 mL/hr over 90 Minutes Intravenous  Once 04/17/14 0414 04/17/14 0605   04/17/14 0415  vancomycin (VANCOCIN) IVPB 1000 mg/200 mL premix     1,000 mg200 mL/hr over 60 Minutes Intravenous  Once 04/17/14 0414 04/17/14 0527   04/17/14 0415  aztreonam (AZACTAM) 2 g in dextrose 5 % 50 mL IVPB     2 g100 mL/hr over 30 Minutes Intravenous  Once 04/17/14 0414 04/17/14 0645      Medications: Scheduled Meds: . ciprofloxacin  750 mg Oral BID  . doxazosin  4 mg Per Tube QHS  . famotidine  20 mg Per Tube QHS  . feeding supplement (JEVITY 1.2 CAL)  1,000 mL Per Tube Q24H  . fesoterodine  4 mg Oral Daily  . heparin  5,000 Units Subcutaneous 3 times per day  . methylphenidate  5 mg Per Tube Daily  . mirtazapine  30 mg Per Tube QHS  . simvastatin  20 mg Per Tube q1800  . sodium chloride  3 mL Intravenous Q12H   Continuous Infusions:   PRN Meds:.sodium chloride, acetaminophen, albuterol, bisacodyl, HYDROcodone-acetaminophen, magnesium hydroxide, [DISCONTINUED] ondansetron **OR** ondansetron (ZOFRAN) IV, sodium chloride    Objective: Weight change: -1 lb 12.8 oz (-0.816 kg)  Intake/Output Summary (Last 24 hours) at 04/22/14 1500 Last data filed at 04/22/14 1441  Gross per 24 hour  Intake    356 ml   Output   1350 ml  Net   -994 ml   Blood pressure 121/76, pulse 102, temperature 98.6 F (37 C), temperature source Oral, resp. rate 16, height 6\' 2"  (1.88 m), weight 218 lb 3.2 oz (98.975 kg), SpO2 95 %. Temp:  [97.6 F (36.4 C)-98.6 F (37 C)] 98.6 F (37 C) (02/08 1309) Pulse Rate:  [102-113] 102 (02/08 1309) Resp:  [16-17] 16 (02/08 1309) BP: (97-121)/(50-76) 121/76 mmHg (02/08 1309) SpO2:  [95 %-99 %] 95 % (02/08 1309) Weight:  [218 lb 3.2 oz (98.975 kg)] 218 lb 3.2 oz (98.975 kg) (02/08 0610)  Physical Exam: General: Alert and awake, oriented x3 HEENT: anicteric sclera, EOMI CVS tachycardic rate, normal r, no murmur rubs or gallops Chest: Fairly clear to auscultation bilaterally, no wheezing, rales or rhonchi Abdomen: soft nontender, surgical site is clean has a feeding tube in place Extremities: no clubbing or edema noted bilaterally Skin: no rashes, feet are without lesions Neuro: nonfocal,  CBC:  CBC Latest Ref Rng 04/21/2014 04/19/2014 04/18/2014  WBC 4.0 - 10.5 K/uL 3.8(L) 3.4(L) 2.7(L)  Hemoglobin 13.0 - 17.0 g/dL 8.9(L) 8.4(L) 7.3(L)  Hematocrit 39.0 - 52.0 % 27.6(L) 26.0(L) 22.8(L)  Platelets 150 - 400 K/uL 137(L) 116(L) 85(L)  BMET No results for input(s): NA, K, CL, CO2, GLUCOSE, BUN, CREATININE, CALCIUM in the last 72 hours.   Liver Panel  No results for input(s): PROT, ALBUMIN, AST, ALT, ALKPHOS, BILITOT, BILIDIR, IBILI in the last 72 hours.     Sedimentation Rate No results for input(s): ESRSEDRATE in the last 72 hours. C-Reactive Protein No results for input(s): CRP in the last 72 hours.  Micro Results: Recent Results (from the past 720 hour(s))  Urine culture     Status: None   Collection Time: 03/23/14  5:47 PM  Result Value Ref Range Status   Specimen Description URINE, CLEAN CATCH  Final   Special Requests NONE  Final   Colony Count NO GROWTH Performed at [facility]   Final   Culture NO GROWTH Performed at [facility]   Final   Report Status 03/25/2014 FINAL  Final  MRSA PCR Screening     Status: None   Collection Time: 03/23/14  7:16 PM  Result Value Ref Range Status   MRSA by PCR NEGATIVE NEGATIVE Final    Comment:        The GeneXpert MRSA Assay (FDA approved for NASAL specimens only), is one component of a comprehensive MRSA colonization surveillance program. It is not intended to diagnose MRSA infection nor to guide or monitor treatment for MRSA infections.   Clostridium Difficile by PCR     Status: None   Collection Time: 03/23/14  9:06 PM  Result Value Ref Range Status   C difficile by pcr NEGATIVE NEGATIVE Final  Urine culture     Status: None   Collection Time: 04/09/14  6:18 PM  Result Value Ref Range Status   Specimen Description URINE, CLEAN CATCH  Final   Special Requests NONE  Final   Colony Count NO GROWTH Performed at [facility]   Final   Culture NO GROWTH Performed at [facility]   Final   Report Status 04/11/2014 FINAL  Final  Clostridium Difficile by PCR     Status: None   Collection Time: 04/09/14  6:18 PM  Result Value Ref Range Status   C difficile by pcr NEGATIVE NEGATIVE Final  Stool culture     Status: None   Collection Time: 04/09/14  6:18 PM  Result Value Ref Range Status   Specimen Description STOOL  Final   Special Requests NONE  Final   Culture   Final    NO SALMONELLA, SHIGELLA, CAMPYLOBACTER, YERSINIA, OR E.COLI 0157:[id number] ISOLATED Note: REDUCED NORMAL FLORA PRESENT Performed at [facility]    Report Status 04/13/2014 FINAL  Final  Blood Culture (routine x 2)     Status: None   Collection Time: 04/09/14  7:18 PM  Result Value Ref Range Status   Specimen Description BLOOD ARM RIGHT  Final   Special Requests BOTTLES DRAWN AEROBIC AND ANAEROBIC 5CC  Final   Culture   Final    NO GROWTH 5 DAYS Performed at [facility]    Report Status 04/16/2014 FINAL  Final  Blood Culture (routine x 2)     Status:  None   Collection Time: 04/09/14  8:23 PM  Result Value Ref Range Status   Specimen Description BLOOD ARM LEFT  Final   Special Requests BOTTLES DRAWN AEROBIC AND ANAEROBIC 5CC  Final   Culture   Final    NO GROWTH 5 DAYS Performed at [facility]    Report Status 04/16/2014 FINAL  Final  MRSA PCR Screening     Status: None   Collection Time: 04/10/14  2:05 AM  Result Value Ref Range Status   MRSA by PCR NEGATIVE NEGATIVE Final    Comment:        The GeneXpert MRSA Assay (FDA approved for NASAL specimens only), is one component of a comprehensive MRSA colonization surveillance program. It is not intended to diagnose MRSA infection nor to guide or monitor treatment for MRSA infections.   Culture, blood (routine x 2)     Status: None (Preliminary result)   Collection Time: 04/17/14  4:04 AM  Result Value Ref Range Status   Specimen Description BLOOD LEFT ARM  Final   Special Requests BOTTLES DRAWN AEROBIC AND ANAEROBIC 4CC EACH  Final   Culture   Final           BLOOD CULTURE RECEIVED NO GROWTH TO DATE CULTURE WILL BE HELD FOR 5 DAYS BEFORE ISSUING A FINAL NEGATIVE REPORT Performed at [facility]    Report Status PENDING  Incomplete  Culture, blood (routine x 2)     Status: None (Preliminary result)   Collection Time: 04/17/14  4:14 AM  Result Value Ref Range Status   Specimen Description BLOOD RIGHT ARM  Final   Special Requests BOTTLES DRAWN AEROBIC AND ANAEROBIC 10CC EACH  Final   Culture   Final           BLOOD CULTURE RECEIVED NO GROWTH TO DATE CULTURE WILL BE HELD FOR 5 DAYS BEFORE ISSUING A FINAL NEGATIVE REPORT Performed at [facility]    Report Status PENDING  Incomplete  Urine culture     Status: None   Collection Time: 04/17/14  4:39 AM  Result Value Ref Range Status   Specimen Description URINE, CATHETERIZED  Final   Special Requests Immunocompromised  Final   Colony Count NO GROWTH Performed at [facility]   Final    Culture NO GROWTH Performed at [facility]   Final   Report Status 04/18/2014 FINAL  Final  Culture, blood (x 2)     Status: None (Preliminary result)   Collection Time: 04/17/14 12:20 PM  Result Value Ref Range Status   Specimen Description BLOOD LEFT HAND  Final   Special Requests BOTTLES DRAWN AEROBIC ONLY [place]  Final   Culture   Final           BLOOD CULTURE RECEIVED NO GROWTH TO DATE CULTURE WILL BE HELD FOR 5 DAYS BEFORE ISSUING A FINAL NEGATIVE REPORT Performed at [facility]    Report Status PENDING  Incomplete  Culture, blood (x 2)     Status: None (Preliminary result)   Collection Time: 04/17/14 12:28 PM  Result Value Ref Range Status   Specimen Description BLOOD RIGHT ANTECUBITAL  Final   Special Requests BOTTLES DRAWN AEROBIC AND ANAEROBIC 10CC  Final   Culture   Final           BLOOD CULTURE RECEIVED NO GROWTH TO DATE CULTURE WILL BE HELD FOR 5 DAYS BEFORE ISSUING A FINAL NEGATIVE REPORT Performed at [facility]    Report Status PENDING  Incomplete  Culture, sputum-assessment     Status: None   Collection Time: 04/17/14  1:20 PM  Result Value Ref Range Status   Specimen Description SPUTUM  Final   Special Requests NONE  Final   Sputum evaluation   Final    MICROSCOPIC FINDINGS SUGGEST THAT THIS SPECIMEN IS NOT REPRESENTATIVE OF  LOWER RESPIRATORY SECRETIONS. PLEASE RECOLLECT. REPORT CALLED TO L.SATTERFIELD,RN 04/17/14 1354 BY BSLADE    Report Status 04/17/2014 FINAL  Final  Culture, expectorated sputum-assessment     Status: None   Collection Time: 04/17/14  5:38 PM  Result Value Ref Range Status   Specimen Description SPUTUM  Final   Special Requests Immunocompromised  Final   Sputum evaluation   Final    MICROSCOPIC FINDINGS SUGGEST THAT THIS SPECIMEN IS NOT REPRESENTATIVE OF LOWER RESPIRATORY SECRETIONS. PLEASE RECOLLECT. CALLED TO R.SARINE,RN AT 2148 BY L.PITT  04/17/14    Report Status 04/17/2014 FINAL  Final  Clostridium Difficile  by PCR     Status: None   Collection Time: 04/17/14  9:10 PM  Result Value Ref Range Status   C difficile by pcr NEGATIVE NEGATIVE Final    Studies/Results: No results found.    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 Recent Proteus bacteremia, C difficile colitis now admitted with picture concerning for septic shock again and concern that endograft may be implicated. The CT done is  reassuring  #1 Sepsis in pt with known Pseudomonal Thoraco-abdominal aortic aneurysm infection:    I would like to trial him on  HIGH DOSE CIPRO but then observe him IN [place] has noted that he has a prolonged QT interval so that certainly does need to be rechecked with a formal EKG while he is on the ciprofloxacin.   I also appreciate the risk for C. difficile colitis but there are no other oral drugs that we could use to potentially suppress the Pseudomonal therapy  I dont think IV therapy is going to help his quality of life   We could finally consider watching him off antimicrobial therapy altogether but I worry about his risk of dying from this infection.  I would consider palliative care consult to discuss goals of care with patient and family. Patient himself keeps asking me if he can go home and then tells me how much she hates living in the nursing home      LOS: 5 days    Alcide Evener 04/22/2014, 3:00 PM

## 2014-04-22 NOTE — Progress Notes (Signed)
[facility] PROGRESS NOTE  [name] AOZ:[id number] DOB: 01-29-1930 DOA: 04/17/2014 PCP: [name], MD Chart reviewed  Summary 81 y.o. male.with a history of a thoraco-abdominal aortic aneurysm in July 9629 complicated by Pseudomonas (sensitive to ceftaz, Zosyn, gent, Levaquin) graft infection and subsequent pseudomonas (12-16-13, pan-sens). He was started on cefepime at that time and was discharged to skilled nursing facility with plan for ongoing IV antibiotics. His PICC line was removed 12-9 and his antibiotics were changed to by mouth Levaquin He is sent to [facility] on 12-10 with a temp of 103.3 and hypotension In the emergency room he was started on vancomycin and cefepime. His CXR was no acute, his WBC is slightly low at 3.4.  His CT of his graft showed: Stable sizes of thoracoabdominal, distal aortic and RIGHT common iliac artery aneurysms post endoluminal stenting. No evidence of endoleak or adjacent abscess collection.  His BCx grew proteus (R- bactrim, cipro, gent. I - tobra). He was treated with merrem/rifampineen by Dr. Johnnye Sima on 04/09/14 He was on levaquin/rifampin.  He had C diff+ 02-26-14 and had been on vanco 125mg  qid  When seen by Dr. Johnnye Sima on December 26 the patient was hypotensive and tachycardic. He range the patient be seen in the emerge department where C difficile PCR was negative. He was thought at that time to have possible healthcare associated pneumonia with history with IV vancomycin and meropenem and improved clinically and was changed back to oral Levaquin. 2 days after discharge the patient return to the hospital with fever hypotension and concern for recurrent infection.   Admitted this time with shock, likely septic, but normal CXR, blood cultures, UA. Started on vancomycin, merrem. ID consulted. Remained hypotensive despite 4 liters saline, so transferred to ICU.  Hypotension eventually resolved with more fluids. Transferred to [place]  2/6  Assessment/Plan:    Sepsis: cx remain negative to date. Repeat CTA abdomen stable. ID stopped vancomycin. If cx remain negative and patient stable, high dose cipro per Dr. Tommy Medal.  Dr. Conley Canal discussed with Dr. Baxter Flattery and she will discuss plan with Dr. Tommy Medal. Concern regarding recurrence of C diff. Continue meropenum for now. -once abx settled, will d/c to SNF    Osteoarthritis of right knee   History of DVT of lower extremity (Feb 2015) -post IVC filter   Aortic valve disorder   Protein-calorie malnutrition, severe: has PEG in place for malnutrition. Monitor on soft diet and give night time TF. Per RN, not eating much.   History of Bacteremia due to Pseudomonas   Recurrent bacteremia   CKD (chronic kidney disease), stage III   Bacterial infection due to Proteus mirabilis   Mycotic aneurysm   Pseudomonas infection   Hx of Clostridium difficile infection Deconditioning: back to SNF soon if stable  Code Status:  DNI Family Communication:   Disposition Plan:  SNF once abx settled  Consultants:  ID  PCCM  Procedures:     Antibiotics: Anti-infectives    Start     Dose/Rate Route Frequency Ordered Stop   04/17/14 1600  vancomycin (VANCOCIN) IVPB 750 mg/150 ml premix  Status:  Discontinued     750 mg150 mL/hr over 60 Minutes Intravenous Every 12 hours 04/17/14 0739 04/19/14 1007   04/17/14 0745  meropenem (MERREM) 1 g in sodium chloride 0.9 % 100 mL IVPB     1 g200 mL/hr over 30 Minutes Intravenous 3 times per day 04/17/14 0739     04/17/14 0415  levofloxacin (LEVAQUIN) IVPB  750 mg     750 mg100 mL/hr over 90 Minutes Intravenous  Once 04/17/14 0414 04/17/14 0605   04/17/14 0415  vancomycin (VANCOCIN) IVPB 1000 mg/200 mL premix     1,000 mg200 mL/hr over 60 Minutes Intravenous  Once 04/17/14 0414 04/17/14 0527   04/17/14 0415  aztreonam (AZACTAM) 2 g in dextrose 5 % 50 mL IVPB     2 g100 mL/hr over 30 Minutes Intravenous  Once 04/17/14 0414 04/17/14 0645       HPI/Subjective: Wants to go home, not SNF- adamant about returning home  Objective: Filed Vitals:   04/22/14 0610  BP: 97/67  Pulse: 113  Temp: 97.6 F (36.4 C)  Resp: 17    Intake/Output Summary (Last 24 hours) at 04/22/14 1031 Last data filed at 04/22/14 0853  Gross per 24 hour  Intake    236 ml  Output   1650 ml  Net  -1414 ml   Filed Weights   04/20/14 0521 04/21/14 0626 04/22/14 0610  Weight: 102.377 kg (225 lb 11.2 oz) 99.791 kg (220 lb) 98.975 kg (218 lb 3.2 oz)    Exam:   General:  Alert, nontoxic. Comfortable. HOH  Cardiovascular: RRR without MGR  Respiratory: CTA without WRR  Abdomen: PEG site ok. S, nt, nd  Ext: no CCE  Basic Metabolic Panel:  Recent Labs Lab 04/17/14 0404 04/18/14 0307 04/19/14 0424  NA 136 141 140  K 3.6 3.0* 3.9  CL 104 119* 111  CO2 24 18* 26  GLUCOSE 107* 88 120*  BUN 21 11 11   CREATININE 0.95 0.64 0.75  CALCIUM 8.9 6.7* 8.1*  MG  --   --  1.8  PHOS  --   --  2.7   Liver Function Tests:  Recent Labs Lab 04/17/14 0404  AST 24  ALT 8  ALKPHOS 58  BILITOT 0.7  PROT 6.2  ALBUMIN 2.6*   No results for input(s): LIPASE, AMYLASE in the last 168 hours. No results for input(s): AMMONIA in the last 168 hours. CBC:  Recent Labs Lab 04/17/14 0557 04/18/14 0307 04/19/14 0424 04/21/14 0447  WBC 4.0 2.7* 3.4* 3.8*  NEUTROABS 3.0  --   --   --   HGB 8.2* 7.3* 8.4* 8.9*  HCT 25.4* 22.8* 26.0* 27.6*  MCV 83.6 84.1 84.7 84.1  PLT 94* 85* 116* 137*   Cardiac Enzymes:  Recent Labs Lab 04/17/14 1228  TROPONINI 0.03   BNP (last 3 results)  Recent Labs  04/09/14 1918  BNP 1434.7*    ProBNP (last 3 results) No results for input(s): PROBNP in the last 8760 hours.  CBG:  Recent Labs Lab 04/21/14 1608 04/21/14 2041 04/22/14 0041 04/22/14 0406 04/22/14 0728  GLUCAP 79 106* 92 108* 127*    Recent Results (from the past 240 hour(s))  Culture, blood (routine x 2)     Status: None (Preliminary  result)   Collection Time: 04/17/14  4:04 AM  Result Value Ref Range Status   Specimen Description BLOOD LEFT ARM  Final   Special Requests BOTTLES DRAWN AEROBIC AND ANAEROBIC 4CC EACH  Final   Culture   Final           BLOOD CULTURE RECEIVED NO GROWTH TO DATE CULTURE WILL BE HELD FOR 5 DAYS BEFORE ISSUING A FINAL NEGATIVE REPORT Performed at [facility]    Report Status PENDING  Incomplete  Culture, blood (routine x 2)     Status: None (Preliminary result)   Collection Time:  04/17/14  4:14 AM  Result Value Ref Range Status   Specimen Description BLOOD RIGHT ARM  Final   Special Requests BOTTLES DRAWN AEROBIC AND ANAEROBIC 10CC EACH  Final   Culture   Final           BLOOD CULTURE RECEIVED NO GROWTH TO DATE CULTURE WILL BE HELD FOR 5 DAYS BEFORE ISSUING A FINAL NEGATIVE REPORT Performed at [facility]    Report Status PENDING  Incomplete  Urine culture     Status: None   Collection Time: 04/17/14  4:39 AM  Result Value Ref Range Status   Specimen Description URINE, CATHETERIZED  Final   Special Requests Immunocompromised  Final   Colony Count NO GROWTH Performed at [facility]   Final   Culture NO GROWTH Performed at [facility]   Final   Report Status 04/18/2014 FINAL  Final  Culture, blood (x 2)     Status: None (Preliminary result)   Collection Time: 04/17/14 12:20 PM  Result Value Ref Range Status   Specimen Description BLOOD LEFT HAND  Final   Special Requests BOTTLES DRAWN AEROBIC ONLY [place]  Final   Culture   Final           BLOOD CULTURE RECEIVED NO GROWTH TO DATE CULTURE WILL BE HELD FOR 5 DAYS BEFORE ISSUING A FINAL NEGATIVE REPORT Performed at [facility]    Report Status PENDING  Incomplete  Culture, blood (x 2)     Status: None (Preliminary result)   Collection Time: 04/17/14 12:28 PM  Result Value Ref Range Status   Specimen Description BLOOD RIGHT ANTECUBITAL  Final   Special Requests BOTTLES DRAWN AEROBIC AND  ANAEROBIC 10CC  Final   Culture   Final           BLOOD CULTURE RECEIVED NO GROWTH TO DATE CULTURE WILL BE HELD FOR 5 DAYS BEFORE ISSUING A FINAL NEGATIVE REPORT Performed at [facility]    Report Status PENDING  Incomplete  Culture, sputum-assessment     Status: None   Collection Time: 04/17/14  1:20 PM  Result Value Ref Range Status   Specimen Description SPUTUM  Final   Special Requests NONE  Final   Sputum evaluation   Final    MICROSCOPIC FINDINGS SUGGEST THAT THIS SPECIMEN IS NOT REPRESENTATIVE OF LOWER RESPIRATORY SECRETIONS. PLEASE RECOLLECT. REPORT CALLED TO L.SATTERFIELD,RN 04/17/14 1354 BY BSLADE    Report Status 04/17/2014 FINAL  Final  Culture, expectorated sputum-assessment     Status: None   Collection Time: 04/17/14  5:38 PM  Result Value Ref Range Status   Specimen Description SPUTUM  Final   Special Requests Immunocompromised  Final   Sputum evaluation   Final    MICROSCOPIC FINDINGS SUGGEST THAT THIS SPECIMEN IS NOT REPRESENTATIVE OF LOWER RESPIRATORY SECRETIONS. PLEASE RECOLLECT. CALLED TO R.SARINE,RN AT 2148 BY L.PITT  04/17/14    Report Status 04/17/2014 FINAL  Final  Clostridium Difficile by PCR     Status: None   Collection Time: 04/17/14  9:10 PM  Result Value Ref Range Status   C difficile by pcr NEGATIVE NEGATIVE Final     Studies: No results found.  Scheduled Meds: . doxazosin  4 mg Per Tube QHS  . famotidine  20 mg Per Tube QHS  . feeding supplement (JEVITY 1.2 CAL)  1,000 mL Per Tube Q24H  . fesoterodine  4 mg Oral Daily  . heparin  5,000 Units Subcutaneous 3 times per day  .  meropenem (MERREM) IV  1 g Intravenous 3 times per day  . methylphenidate  5 mg Per Tube Daily  . mirtazapine  30 mg Per Tube QHS  . simvastatin  20 mg Per Tube q1800  . sodium chloride  3 mL Intravenous Q12H   Continuous Infusions:   Time spent: 25 minutes  [name]  [facility] [email or web address], password [facility] 04/22/2014, 10:31 AM  LOS: 5 days

## 2014-04-22 NOTE — Clinical Social Work Note (Signed)
CSW met with patient's daughter, [name], regarding patient's discharge disposition. Per patient's daughter, patient adamantly refusing SNF placement at this time. Patient's daughter states patient requesting to discharge home with patient's wife and the support of patient's children. Patient's daughter expressed concern regarding patient's family inability to care for patient at home. Patient's daughter aware patient is alert and oriented and able to make own decisions. Patient's daughter and patient's family to speak with patient regarding discharge disposition.  CSW consulted with RNCM regarding information above. RNCM met with patient's daughter at bedside.  CSW to continue to follow and assist with discharge planning needs if patient wishes to return to [facility].  Lubertha Sayres, [place] ([phone]) Licensed Clinical Social Worker Orthopedics [phone]) and Surgical [phone])

## 2014-04-22 NOTE — Progress Notes (Signed)
ANTIBIOTIC CONSULT NOTE - INITIAL  Pharmacy Consult:  Cipro Indication:  Aneurysm infection  Allergies  Allergen Reactions  . Atorvastatin Other (See Comments)    Causes constipation  . Penicillins Other (See Comments)    Other reaction(s): RASH HIVES     Patient Measurements: Height: 6\' 2"  (188 cm) Weight: 218 lb 3.2 oz (98.975 kg) IBW/kg (Calculated) : 82.2   Vital Signs: Temp: 97.6 F (36.4 C) (02/08 0610) Temp Source: Oral (02/08 0610) BP: 97/67 mmHg (02/08 0610) Pulse Rate: 113 (02/08 0610) Intake/Output from previous day: 02/07 0701 - 02/08 0700 In: 238 [P.O.:238] Out: 1500 [Urine:1500] Intake/Output from this shift: Total I/O In: 118 [P.O.:118] Out: 350 [Urine:350]  Labs:  Recent Labs  04/21/14 0447  WBC 3.8*  HGB 8.9*  PLT 137*   Estimated Creatinine Clearance: 89.5 mL/min (by C-G formula based on Cr of 0.75). No results for input(s): VANCOTROUGH, VANCOPEAK, VANCORANDOM, GENTTROUGH, GENTPEAK, GENTRANDOM, TOBRATROUGH, TOBRAPEAK, TOBRARND, AMIKACINPEAK, AMIKACINTROU, AMIKACIN in the last 72 hours.   Microbiology: Recent Results (from the past 720 hour(s))  Blood Culture (routine x 2)     Status: None   Collection Time: 03/23/14  2:41 PM  Result Value Ref Range Status   Specimen Description BLOOD RIGHT ARM  Final   Special Requests BOTTLES DRAWN AEROBIC AND ANAEROBIC 5CC  Final   Culture   Final    NO GROWTH 5 DAYS Performed at [facility]    Report Status 03/29/2014 FINAL  Final  Blood Culture (routine x 2)     Status: None   Collection Time: 03/23/14  2:59 PM  Result Value Ref Range Status   Specimen Description BLOOD LEFT ARM  Final   Special Requests BOTTLES DRAWN AEROBIC AND ANAEROBIC 5CC  Final   Culture   Final    NO GROWTH 5 DAYS Performed at [facility]    Report Status 03/29/2014 FINAL  Final  Urine culture     Status: None   Collection Time: 03/23/14  5:47 PM  Result Value Ref Range Status   Specimen Description  URINE, CLEAN CATCH  Final   Special Requests NONE  Final   Colony Count NO GROWTH Performed at [facility]   Final   Culture NO GROWTH Performed at [facility]   Final   Report Status 03/25/2014 FINAL  Final  MRSA PCR Screening     Status: None   Collection Time: 03/23/14  7:16 PM  Result Value Ref Range Status   MRSA by PCR NEGATIVE NEGATIVE Final    Comment:        The GeneXpert MRSA Assay (FDA approved for NASAL specimens only), is one component of a comprehensive MRSA colonization surveillance program. It is not intended to diagnose MRSA infection nor to guide or monitor treatment for MRSA infections.   Clostridium Difficile by PCR     Status: None   Collection Time: 03/23/14  9:06 PM  Result Value Ref Range Status   C difficile by pcr NEGATIVE NEGATIVE Final  Urine culture     Status: None   Collection Time: 04/09/14  6:18 PM  Result Value Ref Range Status   Specimen Description URINE, CLEAN CATCH  Final   Special Requests NONE  Final   Colony Count NO GROWTH Performed at [facility]   Final   Culture NO GROWTH Performed at [facility]   Final   Report Status 04/11/2014 FINAL  Final  Clostridium Difficile by PCR  Status: None   Collection Time: 04/09/14  6:18 PM  Result Value Ref Range Status   C difficile by pcr NEGATIVE NEGATIVE Final  Stool culture     Status: None   Collection Time: 04/09/14  6:18 PM  Result Value Ref Range Status   Specimen Description STOOL  Final   Special Requests NONE  Final   Culture   Final    NO SALMONELLA, SHIGELLA, CAMPYLOBACTER, YERSINIA, OR E.COLI 0157:[id number] ISOLATED Note: REDUCED NORMAL FLORA PRESENT Performed at [facility]    Report Status 04/13/2014 FINAL  Final  Blood Culture (routine x 2)     Status: None   Collection Time: 04/09/14  7:18 PM  Result Value Ref Range Status   Specimen Description BLOOD ARM RIGHT  Final   Special Requests BOTTLES DRAWN AEROBIC AND ANAEROBIC  5CC  Final   Culture   Final    NO GROWTH 5 DAYS Performed at [facility]    Report Status 04/16/2014 FINAL  Final  Blood Culture (routine x 2)     Status: None   Collection Time: 04/09/14  8:23 PM  Result Value Ref Range Status   Specimen Description BLOOD ARM LEFT  Final   Special Requests BOTTLES DRAWN AEROBIC AND ANAEROBIC 5CC  Final   Culture   Final    NO GROWTH 5 DAYS Performed at [facility]    Report Status 04/16/2014 FINAL  Final  MRSA PCR Screening     Status: None   Collection Time: 04/10/14  2:05 AM  Result Value Ref Range Status   MRSA by PCR NEGATIVE NEGATIVE Final    Comment:        The GeneXpert MRSA Assay (FDA approved for NASAL specimens only), is one component of a comprehensive MRSA colonization surveillance program. It is not intended to diagnose MRSA infection nor to guide or monitor treatment for MRSA infections.   Culture, blood (routine x 2)     Status: None (Preliminary result)   Collection Time: 04/17/14  4:04 AM  Result Value Ref Range Status   Specimen Description BLOOD LEFT ARM  Final   Special Requests BOTTLES DRAWN AEROBIC AND ANAEROBIC 4CC EACH  Final   Culture   Final           BLOOD CULTURE RECEIVED NO GROWTH TO DATE CULTURE WILL BE HELD FOR 5 DAYS BEFORE ISSUING A FINAL NEGATIVE REPORT Performed at [facility]    Report Status PENDING  Incomplete  Culture, blood (routine x 2)     Status: None (Preliminary result)   Collection Time: 04/17/14  4:14 AM  Result Value Ref Range Status   Specimen Description BLOOD RIGHT ARM  Final   Special Requests BOTTLES DRAWN AEROBIC AND ANAEROBIC 10CC EACH  Final   Culture   Final           BLOOD CULTURE RECEIVED NO GROWTH TO DATE CULTURE WILL BE HELD FOR 5 DAYS BEFORE ISSUING A FINAL NEGATIVE REPORT Performed at [facility]    Report Status PENDING  Incomplete  Urine culture     Status: None   Collection Time: 04/17/14  4:39 AM  Result Value Ref Range Status    Specimen Description URINE, CATHETERIZED  Final   Special Requests Immunocompromised  Final   Colony Count NO GROWTH Performed at [facility]   Final   Culture NO GROWTH Performed at [facility]   Final   Report Status 04/18/2014 FINAL  Final  Culture, blood (x 2)     Status: None (Preliminary result)   Collection Time: 04/17/14 12:20 PM  Result Value Ref Range Status   Specimen Description BLOOD LEFT HAND  Final   Special Requests BOTTLES DRAWN AEROBIC ONLY [place]  Final   Culture   Final           BLOOD CULTURE RECEIVED NO GROWTH TO DATE CULTURE WILL BE HELD FOR 5 DAYS BEFORE ISSUING A FINAL NEGATIVE REPORT Performed at [facility]    Report Status PENDING  Incomplete  Culture, blood (x 2)     Status: None (Preliminary result)   Collection Time: 04/17/14 12:28 PM  Result Value Ref Range Status   Specimen Description BLOOD RIGHT ANTECUBITAL  Final   Special Requests BOTTLES DRAWN AEROBIC AND ANAEROBIC 10CC  Final   Culture   Final           BLOOD CULTURE RECEIVED NO GROWTH TO DATE CULTURE WILL BE HELD FOR 5 DAYS BEFORE ISSUING A FINAL NEGATIVE REPORT Performed at [facility]    Report Status PENDING  Incomplete  Culture, sputum-assessment     Status: None   Collection Time: 04/17/14  1:20 PM  Result Value Ref Range Status   Specimen Description SPUTUM  Final   Special Requests NONE  Final   Sputum evaluation   Final    MICROSCOPIC FINDINGS SUGGEST THAT THIS SPECIMEN IS NOT REPRESENTATIVE OF LOWER RESPIRATORY SECRETIONS. PLEASE RECOLLECT. REPORT CALLED TO L.SATTERFIELD,RN 04/17/14 1354 BY BSLADE    Report Status 04/17/2014 FINAL  Final  Culture, expectorated sputum-assessment     Status: None   Collection Time: 04/17/14  5:38 PM  Result Value Ref Range Status   Specimen Description SPUTUM  Final   Special Requests Immunocompromised  Final   Sputum evaluation   Final    MICROSCOPIC FINDINGS SUGGEST THAT THIS SPECIMEN IS NOT REPRESENTATIVE  OF LOWER RESPIRATORY SECRETIONS. PLEASE RECOLLECT. CALLED TO R.SARINE,RN AT 2148 BY L.PITT  04/17/14    Report Status 04/17/2014 FINAL  Final  Clostridium Difficile by PCR     Status: None   Collection Time: 04/17/14  9:10 PM  Result Value Ref Range Status   C difficile by pcr NEGATIVE NEGATIVE Final    Medical History: Past Medical History  Diagnosis Date  . Hypertension   . BPH (benign prostatic hyperplasia)   . Hyperlipidemia   . Cancer 2008-2009    prostate TREATED WITH RADIATION  . AAA (abdominal aortic aneurysm) 04/2012    STENTING OF AAA IN [place]  . Junctional cardiac arrhythmia     Occurred postoperatively after urologic surgery  . Severe sinus bradycardia     Occurred postoperatively after urologic surgery  . Aortic valve disorders   . Other primary cardiomyopathies   . Dysphagia   . Status post insertion of percutaneous endoscopic gastrostomy (PEG) tube 02/11/14  . Bacteremia due to Pseudomonas   . Infected aortic graft   . DVT (deep venous thrombosis) 04/2013    s/p IVC Filter     Assessment: 12 YOM with multiple recent admissions (last d/c 04/15/14) presents from NH with fever, concern for sepsis given recent admissions and HCAP.  Patient's antibiotic de-escalated to Cipro monotherapy for aneurysm infection.  His renal function has been stable.  Last QTc is elevated at 550s on 04/17/14.  Vanc 2/3 >> 2/5 Merrem 2/3 >> 2/8 Cipro 2/8 >>  1/13 VT = 23.2 mcg/mL on 750mg  q12  2/3 Blood x4:  ngtd 2/3 Urine: neg 2/3 Cdiff PCR: negative 2/3 Sputum: no good specimen   Goal of Therapy:  Clearance of infection   Plan:  - Cipro 750mg  PO BID - Monitor renal fxn, QTc - Consider discontinuing SSI/CBG checks - BMET in AM    Seher Schlagel D. Mina Marble, PharmD, BCPS Pager:  [phone] 04/22/2014, 1:07 PM

## 2014-04-23 DIAGNOSIS — R509 Fever, unspecified: Secondary | ICD-10-CM | POA: Insufficient documentation

## 2014-04-23 LAB — CULTURE, BLOOD (ROUTINE X 2)
Culture: NO GROWTH
Culture: NO GROWTH
Culture: NO GROWTH
Culture: NO GROWTH

## 2014-04-23 LAB — GLUCOSE, CAPILLARY
Glucose-Capillary: 102 mg/dL — ABNORMAL HIGH (ref 70–99)
Glucose-Capillary: 75 mg/dL (ref 70–99)
Glucose-Capillary: 110 mg/dL — ABNORMAL HIGH (ref 70–99)
Glucose-Capillary: 82 mg/dL (ref 70–99)

## 2014-04-23 LAB — BASIC METABOLIC PANEL
Anion gap: 3 — ABNORMAL LOW (ref 5–15)
BUN: 15 mg/dL (ref 6–23)
CHLORIDE: 107 mmol/L (ref 96–112)
CO2: 29 mmol/L (ref 19–32)
CREATININE: 1.12 mg/dL (ref 0.50–1.35)
Calcium: 8.9 mg/dL (ref 8.4–10.5)
GFR calc Af Amer: 69 mL/min — ABNORMAL LOW (ref >90)
GFR calc non Af Amer: 59 mL/min — ABNORMAL LOW (ref >90)
Glucose, Bld: 124 mg/dL — ABNORMAL HIGH (ref 70–99)
Potassium: 4.1 mmol/L (ref 3.5–5.1)
Sodium: 139 mmol/L (ref 135–145)

## 2014-04-23 MED ORDER — FAMOTIDINE 20 MG PO TABS: 20.0000 mg | ORAL_TABLET | Freq: Every day | ORAL | Status: DC

## 2014-04-23 MED ORDER — JEVITY 1.2 CAL PO LIQD: 1000.0000 mL | ORAL | Status: DC

## 2014-04-23 MED ORDER — CIPROFLOXACIN HCL 750 MG PO TABS: 750.0000 mg | ORAL_TABLET | Freq: Two times a day (BID) | ORAL | Status: DC

## 2014-04-23 MED ORDER — MAGNESIUM OXIDE 400 (241.3 MG) MG PO TABS
200.0000 mg | ORAL_TABLET | Freq: Every day | ORAL | Status: DC
  Filled 2014-04-23: qty 0.5

## 2014-04-23 MED ORDER — MAGNESIUM OXIDE 400 (241.3 MG) MG PO TABS: 200.0000 mg | ORAL_TABLET | Freq: Every day | ORAL | Status: AC

## 2014-04-23 NOTE — Progress Notes (Signed)
[facility]    Subjective: Feels well  Antibiotics:  Anti-infectives    Start     Dose/Rate Route Frequency Ordered Stop   04/23/14 0000  ciprofloxacin (CIPRO) 750 MG tablet     750 mg Oral 2 times daily 04/23/14 1427     04/22/14 1300  ciprofloxacin (CIPRO) tablet 750 mg     750 mg Oral 2 times daily 04/22/14 1200     04/17/14 1600  vancomycin (VANCOCIN) IVPB 750 mg/150 ml premix  Status:  Discontinued     750 mg150 mL/hr over 60 Minutes Intravenous Every 12 hours 04/17/14 0739 04/19/14 1007   04/17/14 0745  meropenem (MERREM) 1 g in sodium chloride 0.9 % 100 mL IVPB  Status:  Discontinued     1 g200 mL/hr over 30 Minutes Intravenous 3 times per day 04/17/14 0739 04/22/14 1157   04/17/14 0415  levofloxacin (LEVAQUIN) IVPB 750 mg     750 mg100 mL/hr over 90 Minutes Intravenous  Once 04/17/14 0414 04/17/14 0605   04/17/14 0415  vancomycin (VANCOCIN) IVPB 1000 mg/200 mL premix     1,000 mg200 mL/hr over 60 Minutes Intravenous  Once 04/17/14 0414 04/17/14 0527   04/17/14 0415  aztreonam (AZACTAM) 2 g in dextrose 5 % 50 mL IVPB     2 g100 mL/hr over 30 Minutes Intravenous  Once 04/17/14 0414 04/17/14 0645      Medications: Scheduled Meds: . ciprofloxacin  750 mg Oral BID  . doxazosin  4 mg Per Tube QHS  . famotidine  20 mg Per Tube QHS  . feeding supplement (JEVITY 1.2 CAL)  1,000 mL Per Tube Q24H  . fesoterodine  4 mg Oral Daily  . heparin  5,000 Units Subcutaneous 3 times per day  . magnesium oxide  200 mg Oral Daily  . methylphenidate  5 mg Per Tube Daily  . mirtazapine  30 mg Per Tube QHS  . simvastatin  20 mg Per Tube q1800  . sodium chloride  3 mL Intravenous Q12H   Continuous Infusions:   PRN Meds:.sodium chloride, acetaminophen, albuterol, bisacodyl, HYDROcodone-acetaminophen, magnesium hydroxide, [DISCONTINUED] ondansetron **OR** ondansetron (ZOFRAN) IV, sodium chloride    Objective: Weight change: 1 lb (0.454 kg)  Intake/Output  Summary (Last 24 hours) at 04/23/14 1603 Last data filed at 04/23/14 0557  Gross per 24 hour  Intake   1980 ml  Output    350 ml  Net   1630 ml   Blood pressure 132/87, pulse 89, temperature 97.6 F (36.4 C), temperature source Oral, resp. rate 18, height 6\' 2"  (1.88 m), weight 219 lb 3.2 oz (99.428 kg), SpO2 98 %. Temp:  [97.6 F (36.4 C)-98.5 F (36.9 C)] 97.6 F (36.4 C) (02/09 1428) Pulse Rate:  [89-101] 89 (02/09 1428) Resp:  [16-18] 18 (02/09 1428) BP: (96-132)/(53-87) 132/87 mmHg (02/09 1428) SpO2:  [95 %-98 %] 98 % (02/09 1428) Weight:  [219 lb 3.2 oz (99.428 kg)] 219 lb 3.2 oz (99.428 kg) (02/09 0507)  Physical Exam: General: Alert and awake, oriented x3 HEENT: anicteric sclera, EOMI CVS tachycardic rate, normal r, no murmur rubs or gallops Chest: Fairly clear to auscultation bilaterally, no wheezing, rales or rhonchi Abdomen: soft nontender, surgical site is clean has a feeding tube in place Extremities: no clubbing or edema noted bilaterally Skin: no rashes, feet are without lesions Neuro: nonfocal,  CBC:  CBC Latest Ref Rng 04/21/2014 04/19/2014 04/18/2014  WBC 4.0 - 10.5 K/uL 3.8(L) 3.4(L) 2.7(L)  Hemoglobin  13.0 - 17.0 g/dL 8.9(L) 8.4(L) 7.3(L)  Hematocrit 39.0 - 52.0 % 27.6(L) 26.0(L) 22.8(L)  Platelets 150 - 400 K/uL 137(L) 116(L) 85(L)       BMET  Recent Labs  04/23/14 0525  NA 139  K 4.1  CL 107  CO2 29  GLUCOSE 124*  BUN 15  CREATININE 1.12  CALCIUM 8.9     Liver Panel  No results for input(s): PROT, ALBUMIN, AST, ALT, ALKPHOS, BILITOT, BILIDIR, IBILI in the last 72 hours.     Sedimentation Rate No results for input(s): ESRSEDRATE in the last 72 hours. C-Reactive Protein No results for input(s): CRP in the last 72 hours.  Micro Results: Recent Results (from the past 720 hour(s))  Urine culture     Status: None   Collection Time: 04/09/14  6:18 PM  Result Value Ref Range Status   Specimen Description URINE, CLEAN CATCH  Final    Special Requests NONE  Final   Colony Count NO GROWTH Performed at [facility]   Final   Culture NO GROWTH Performed at [facility]   Final   Report Status 04/11/2014 FINAL  Final  Clostridium Difficile by PCR     Status: None   Collection Time: 04/09/14  6:18 PM  Result Value Ref Range Status   C difficile by pcr NEGATIVE NEGATIVE Final  Stool culture     Status: None   Collection Time: 04/09/14  6:18 PM  Result Value Ref Range Status   Specimen Description STOOL  Final   Special Requests NONE  Final   Culture   Final    NO SALMONELLA, SHIGELLA, CAMPYLOBACTER, YERSINIA, OR E.COLI 0157:H7 ISOLATED Note: REDUCED NORMAL FLORA PRESENT Performed at [facility]    Report Status 04/13/2014 FINAL  Final  Blood Culture (routine x 2)     Status: None   Collection Time: 04/09/14  7:18 PM  Result Value Ref Range Status   Specimen Description BLOOD ARM RIGHT  Final   Special Requests BOTTLES DRAWN AEROBIC AND ANAEROBIC 5CC  Final   Culture   Final    NO GROWTH 5 DAYS Performed at [facility]    Report Status 04/16/2014 FINAL  Final  Blood Culture (routine x 2)     Status: None   Collection Time: 04/09/14  8:23 PM  Result Value Ref Range Status   Specimen Description BLOOD ARM LEFT  Final   Special Requests BOTTLES DRAWN AEROBIC AND ANAEROBIC 5CC  Final   Culture   Final    NO GROWTH 5 DAYS Performed at [facility]    Report Status 04/16/2014 FINAL  Final  MRSA PCR Screening     Status: None   Collection Time: 04/10/14  2:05 AM  Result Value Ref Range Status   MRSA by PCR NEGATIVE NEGATIVE Final    Comment:        The GeneXpert MRSA Assay (FDA approved for NASAL specimens only), is one component of a comprehensive MRSA colonization surveillance program. It is not intended to diagnose MRSA infection nor to guide or monitor treatment for MRSA infections.   Culture, blood (routine x 2)     Status: None   Collection Time: 04/17/14   4:04 AM  Result Value Ref Range Status   Specimen Description BLOOD LEFT ARM  Final   Special Requests BOTTLES DRAWN AEROBIC AND ANAEROBIC 4CC EACH  Final   Culture   Final    NO GROWTH 5 DAYS Performed at  [facility]    Report Status 04/23/2014 FINAL  Final  Culture, blood (routine x 2)     Status: None   Collection Time: 04/17/14  4:14 AM  Result Value Ref Range Status   Specimen Description BLOOD RIGHT ARM  Final   Special Requests BOTTLES DRAWN AEROBIC AND ANAEROBIC 10CC EACH  Final   Culture   Final    NO GROWTH 5 DAYS Note: Culture results may be compromised due to an excessive volume of blood received in culture bottles. Performed at [facility]    Report Status 04/23/2014 FINAL  Final  Urine culture     Status: None   Collection Time: 04/17/14  4:39 AM  Result Value Ref Range Status   Specimen Description URINE, CATHETERIZED  Final   Special Requests Immunocompromised  Final   Colony Count NO GROWTH Performed at [facility]   Final   Culture NO GROWTH Performed at [facility]   Final   Report Status 04/18/2014 FINAL  Final  Culture, blood (x 2)     Status: None   Collection Time: 04/17/14 12:20 PM  Result Value Ref Range Status   Specimen Description BLOOD LEFT HAND  Final   Special Requests BOTTLES DRAWN AEROBIC ONLY [place]  Final   Culture   Final    NO GROWTH 5 DAYS Performed at [facility]    Report Status 04/23/2014 FINAL  Final  Culture, blood (x 2)     Status: None   Collection Time: 04/17/14 12:28 PM  Result Value Ref Range Status   Specimen Description BLOOD RIGHT ANTECUBITAL  Final   Special Requests BOTTLES DRAWN AEROBIC AND ANAEROBIC 10CC  Final   Culture   Final    NO GROWTH 5 DAYS Performed at [facility]    Report Status 04/23/2014 FINAL  Final  Culture, sputum-assessment     Status: None   Collection Time: 04/17/14  1:20 PM  Result Value Ref Range Status   Specimen Description SPUTUM   Final   Special Requests NONE  Final   Sputum evaluation   Final    MICROSCOPIC FINDINGS SUGGEST THAT THIS SPECIMEN IS NOT REPRESENTATIVE OF LOWER RESPIRATORY SECRETIONS. PLEASE RECOLLECT. REPORT CALLED TO L.SATTERFIELD,RN 04/17/14 1354 BY BSLADE    Report Status 04/17/2014 FINAL  Final  Culture, expectorated sputum-assessment     Status: None   Collection Time: 04/17/14  5:38 PM  Result Value Ref Range Status   Specimen Description SPUTUM  Final   Special Requests Immunocompromised  Final   Sputum evaluation   Final    MICROSCOPIC FINDINGS SUGGEST THAT THIS SPECIMEN IS NOT REPRESENTATIVE OF LOWER RESPIRATORY SECRETIONS. PLEASE RECOLLECT. CALLED TO R.SARINE,RN AT 2148 BY L.PITT  04/17/14    Report Status 04/17/2014 FINAL  Final  Clostridium Difficile by PCR     Status: None   Collection Time: 04/17/14  9:10 PM  Result Value Ref Range Status   C difficile by pcr NEGATIVE NEGATIVE Final    Studies/Results: No results found.    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 Recent Proteus bacteremia, C difficile colitis now admitted with picture concerning for septic shock again and concern that endograft may be implicated. The CT done is  reassuring  #1 Sepsis in pt with known Pseudomonal Thoraco-abdominal aortic aneurysm infection:    I would like to trial him on  HIGH  DOSE CIPRO but then observe him IN [place] has noted that he has a prolonged QT interval  In 500s. EKG has been rechecked and Dr Eliseo Squires has reviewed risk benefit we are dealing with here treating . Suppressing pseudomonal mycotic aneurysm to avert risk of septic shock, with risk of rhythm he had due to QT prolongation or C. difficile colitis, versus risk of not treating or trying to suppress pseudomonal mycotic aneurysm   We would be happy to see this gentleman in our clinic in the next 3-5 weeks.     LOS: 6 days   Alcide Evener 04/23/2014, 4:03 PM

## 2014-04-23 NOTE — Progress Notes (Signed)
Report called to [facility] at [facility]

## 2014-04-23 NOTE — Progress Notes (Addendum)
PT Cancellation Note  Patient Details Name: [name] MRN: [id number] DOB: Apr 20, 1929   Cancelled Treatment:    Reason Eval/Treat Not Completed: Patient declined, no reason specified   Attempted to perform PT tx this AM preparing pt for discharge home (as pt refusing return to SNF) however when simulating home environment with HOB flat and no rails, pt became agitated that therapist would not put rails up for transfer and refused to participate, "Cover me up and leave, get out."  Explained reasoning for trying transfer without rails esp if pt wants to return home however pt not willing to attempt and became upset and asked therapist to leave. RN notified. Continue to recommend ST SNF for safe discharge.  Attempted to see patient in PM per request of family however pt continues to refuse to participate, "I don't want to get up, I just want to nap." Reports going back to [facility] today.    Candy Sledge A 04/23/2014, 10:36 AM Candy Sledge, PT, DPT [phone]

## 2014-04-23 NOTE — Discharge Summary (Addendum)
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  He now presents with low-grade fever, hypotension, sepsis with unclear source, chest x-ray is improved from before, UA is better, sepsis protocol was activated in the ER and I was called to admit the patient. Patient  himself is noted to be symptom-free, does not feel that he has a fever, no headache chest or abdominal pain. He is not short of breath denies any cough. No diarrhea  Hospital Course:  Sepsis: cx remain negative to date. Repeat CTA abdomen stable. ID stopped vancomycin.  high dose cipro per Dr. Tommy Medal. -will need close monitoring of his Qtc.  Stable from 2/8-2/9-- discussed options with daughters: 1. Continue cipro with chance of arrhythmia 2.  No abx and monitor for infection  3.  PICC and IV Abx They do not want him off abx as they are fearful that his infection will return They understand the risks and benefits of the cipro.  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 Monitor on soft diet and give night time TF  History of Bacteremia due to Pseudomonas  Recurrent bacteremia  CKD (chronic kidney disease), stage III  Bacterial infection due to Proteus mirabilis  Mycotic aneurysm  Pseudomonas infection  Hx of Clostridium difficile infection    Consultations:  PCCM  ID  Discharge Exam: Filed Vitals:   04/23/14 0507  BP: 96/64  Pulse: 92  Temp: 98.5 F (36.9 C)  Resp: 18    General: agreeable to SNF- tearful at the way he is treated there-- says he will go to SNF TODAY only Cardiovascular: rrr Respiratory: no wheezing  Discharge Instructions   Discharge  Instructions    Discharge instructions    Complete by:  As directed   EKG weekly regarding Qtc, weekly labs- K and Mg Soft/bland diet Palliative care to follow at SNF Partial code: CPR, no intubation     Increase activity slowly    Complete by:  As directed           Current Discharge Medication List    START taking these medications   Details   ciprofloxacin (CIPRO) 750 MG tablet Take 1 tablet (750 mg total) by mouth 2 (two) times daily.    famotidine (PEPCID) 20 MG tablet Place 1 tablet (20 mg total) into feeding tube at bedtime.    magnesium oxide (MAG-OX) 400 (241.3 MG) MG tablet Take 0.5 tablets (200 mg total) by mouth daily.      CONTINUE these medications which have CHANGED   Details  Nutritional Supplements (FEEDING SUPPLEMENT, JEVITY 1.2 CAL,) LIQD Place 1,000 mLs into feeding tube daily. Refills: 0      CONTINUE these medications which have NOT CHANGED   Details  acetaminophen (TYLENOL) 500 MG tablet Take 1,000 mg by mouth every 6 (six) hours as needed for mild pain or moderate pain.    albuterol (PROVENTIL) (2.5 MG/3ML) 0.083% nebulizer solution Take 3 mLs (2.5 mg total) by nebulization every 2 (two) hours as needed for wheezing or shortness of breath. Qty: 75 mL, Refills: 12    Amino Acids-Protein Hydrolys (FEEDING SUPPLEMENT, PRO-STAT SUGAR FREE 64,) LIQD Take 30 mLs by mouth 3 (three) times daily with meals. Qty: 900 mL, Refills: 0    bisacodyl (DULCOLAX) 10 MG suppository Place 10 mg rectally as needed for mild constipation or moderate constipation.    doxazosin (CARDURA) 4 MG tablet Take 4 mg by mouth at bedtime.    lactulose (CHRONULAC) 10 GM/15ML solution Take 10 g by mouth daily. Every day per MAR    magnesium hydroxide (MILK OF MAGNESIA) 400 MG/5ML suspension Take 30 mLs by mouth daily as needed for mild constipation.    methylphenidate (RITALIN) 5 MG tablet Take 1 tablet (5 mg total) by mouth daily. Qty: 30 tablet, Refills: 0    mirtazapine (REMERON) 30 MG tablet Take 30 mg by mouth at bedtime.    simvastatin (ZOCOR) 20 MG tablet Take 20 mg by mouth daily.    sodium phosphate (FLEET) enema Place 1 enema rectally as needed (for constipation). follow package directions    tolterodine (DETROL LA) 4 MG 24 hr capsule Take 4 mg by mouth daily.    triamcinolone cream (KENALOG) 0.1 % Apply 1  application topically 2 (two) times daily as needed (rash).      STOP taking these medications     famotidine (PEPCID) 40 MG/5ML suspension      levofloxacin (LEVAQUIN) 750 MG tablet      LORazepam (ATIVAN) 2 MG/ML concentrated solution      metoprolol succinate (TOPROL-XL) 25 MG 24 hr tablet      rifampin (RIFADIN) 300 MG capsule      traMADol (ULTRAM) 50 MG tablet      Water For Irrigation, Sterile (FREE WATER) SOLN        Allergies  Allergen Reactions  . Atorvastatin Other (See Comments)    Causes constipation  . Penicillins Other (See Comments)    Other reaction(s): RASH HIVES       The results of significant diagnostics from this hospitalization (including imaging, microbiology, ancillary and laboratory) are listed below for reference.    Significant Diagnostic Studies:  Dg Chest Port 1 View  04/19/2014   CLINICAL DATA:  Pulmonary infiltrates.  EXAM: PORTABLE CHEST - 1 VIEW  COMPARISON:  04/18/2014.  FINDINGS: Persistent severe cardiomegaly with bilateral pulmonary infiltrates and right pleural effusion consistent with congestive heart failure appear underlying basilar pneumonia cannot be excluded. Aortic stent graft in stable position. No pneumothorax.  IMPRESSION: 1. Persistent severe cardiomegaly bilateral pulmonary alveolar infiltrates and small right pleural effusion consistent with congestive heart failure bibasilar pneumonia cannot be excluded. 2. Aortic stent graft in stable position.   Electronically Signed   By: Marcello Moores  Register   On: 04/19/2014 07:26   Dg Chest Port 1 View  04/18/2014   CLINICAL DATA:  Bilateral airspace disease, history of thoracic aortic aneurysm treated with stent graft  EXAM: PORTABLE CHEST - 1 VIEW  COMPARISON:  Portable chest x-ray of April 17, 2014  FINDINGS: The lungs are better inflated today. The interstitial markings remain increased. The pulmonary vascularity remains engorged. The cardiopericardial silhouette remains enlarged. There  are small bilateral pleural effusions layering posteriorly. The bony thorax is unremarkable.  IMPRESSION: Persistent findings of congestive heart failure with pulmonary interstitial edema and small bilateral pleural effusions. Bibasilar subsegmental atelectasis or pneumonia is unchanged.   Electronically Signed   By: David  Martinique   On: 04/18/2014 07:31   Dg Chest Port 1 View  04/17/2014   CLINICAL DATA:  Fever.  EXAM: PORTABLE CHEST - 1 VIEW  COMPARISON:  04/09/2014  FINDINGS: Cardiomediastinal contours with aortic stent graft in place, unchanged from prior exam. Mildly improved right basilar opacity with residual atelectasis. Unchanged atelectasis at the left lung base. No large pleural effusion or pneumothorax. Osseous structures are stable.  IMPRESSION: Improved right basilar aeration. No new consolidation to suggest progressive or new pneumonia.   Electronically Signed   By: Jeb Levering M.D.   On: 04/17/2014 04:15   Dg Chest Port 1 View  04/09/2014   CLINICAL DATA:  Altered mental status  EXAM: PORTABLE CHEST - 1 VIEW  COMPARISON:  Portable exam 1801 hr compared to 03/24/2014  FINDINGS: Atherosclerotic calcification aorta with prior aortic stenting.  Stable heart size and mediastinal contours.  Increased LEFT basilar atelectasis versus infiltrate.  Mild persistent opacity in RIGHT lower lobe little changed.  Upper lungs clear.  No gross pleural effusion or pneumothorax.  IMPRESSION: Persistent RIGHT basilar opacity with increased atelectasis versus infiltrate in LEFT lower lobe.   Electronically Signed   By: Lavonia Dana M.D.   On: 04/09/2014 18:16   Ct Angio Abd/pel W/ And/or W/o  04/19/2014   CLINICAL DATA:  Diffuse abdominal pain, sepsis and history of prior endovascular thoracoabdominal aneurysm repair in July, 1749 complicated by Pseudomonas graft infection and pseudoaneurysm of the celiac axis.  EXAM: CTA ABDOMEN AND PELVIS WITHOUT CONTRAST  TECHNIQUE: Multidetector CT imaging of the abdomen  and pelvis was performed using the standard protocol during bolus administration of intravenous contrast. Multiplanar reconstructed images and MIPs were obtained and reviewed to evaluate the vascular anatomy.  CONTRAST:  178mL OMNIPAQUE IOHEXOL 350 MG/ML SOLN  COMPARISON:  CTA of the chest, abdomen and pelvis on 02/26/2014 and multiple other imaging studies.  FINDINGS: Complex thoracoabdominal demonstrated endograft placement again noted. Only the lower portion of the descending thoracic component is visualized on the study. The endograft shows stable patency and positioning. Fenestration with stenting of the celiac axis, superior mesenteric artery, bilateral single renal arteries and graft extensions into the right internal and external iliac arteries show normal patency.  Stable soft tissue thickening remains around the celiac axis with stent extension into the common hepatic artery. There no longer is contrast opacification of a separate pseudoaneurysm as depicted by CTA October, 2015. No new pseudoaneurysms identified. No evidence of abnormal air surrounding the endograft. No abnormal fluid collections.  On the arterial phase of imaging, no obvious endoleak is identified. A delayed sequence was not obtained on the current study. Native right common iliac aneurysm sac remains thrombosed. Distal left common iliac limb and both right internal and external iliac limb landing zones show normal patency and apposition. Native iliac and common femoral arteries showed no significant disease.  The visualized lung bases show small bilateral pleural effusions and bibasilar atelectasis. The liver, gallbladder, spleen and adrenal glands are unremarkable. There is some motion artifact on the study and the arms were also along the side of the body. There may be some subtle stranding in the fat adjacent to the tail of the pancreas. Correlation suggested with any clinical evidence of acute pancreatitis.  Bowel is unremarkable  with no evidence of obstruction or thickening. No free air is identified. There is a gastrostomy tube positioned in the body of the stomach. An IVC filter is in place in the infrarenal IVC. The bladder is unremarkable. No hernias are identified. Bony structures show mild spondylosis of the lower thoracic and lumbar spine.  Review of the MIP images confirms the above findings.  IMPRESSION: 1. No further opacification of celiac axis pseudoaneurysm following extend extension into the common hepatic artery. There remains soft tissue thickening around the celiac axis at the level of prior mycotic pseudoaneurysm. No abnormal fluid collection is identified. 2. Stable patency of complex fenestrated thoracoabdominal endograft extending into native iliac arteries bilaterally. 3. Small bilateral pleural effusions. 4. Motion artifact versus potential subtle inflammatory stranding adjacent to the tail of the pancreas. Recommend correlation with any clinical evidence of pancreatitis.   Electronically Signed   By: Aletta Edouard M.D.   On: 04/19/2014 14:00    Microbiology: Recent Results (from the past 240 hour(s))  Culture, blood (routine x 2)     Status: None   Collection Time: 04/17/14  4:04 AM  Result Value Ref Range Status   Specimen Description BLOOD LEFT ARM  Final   Special Requests BOTTLES DRAWN AEROBIC AND ANAEROBIC 4CC EACH  Final   Culture   Final    NO GROWTH 5 DAYS Performed at [facility]    Report Status 04/23/2014 FINAL  Final  Culture, blood (routine x 2)     Status: None   Collection Time: 04/17/14  4:14 AM  Result Value Ref Range Status   Specimen Description BLOOD RIGHT ARM  Final   Special Requests BOTTLES DRAWN AEROBIC AND ANAEROBIC 10CC EACH  Final   Culture   Final    NO GROWTH 5 DAYS Note: Culture results may be compromised due to an excessive volume of blood received in culture bottles. Performed at [facility]    Report Status 04/23/2014 FINAL  Final  Urine  culture     Status: None   Collection Time: 04/17/14  4:39 AM  Result Value Ref Range Status   Specimen Description URINE, CATHETERIZED  Final   Special Requests Immunocompromised  Final   Colony Count NO GROWTH Performed at [facility]   Final   Culture NO GROWTH Performed at [facility]   Final   Report Status 04/18/2014 FINAL  Final  Culture, blood (x 2)  Status: None   Collection Time: 04/17/14 12:20 PM  Result Value Ref Range Status   Specimen Description BLOOD LEFT HAND  Final   Special Requests BOTTLES DRAWN AEROBIC ONLY [place]  Final   Culture   Final    NO GROWTH 5 DAYS Performed at [facility]    Report Status 04/23/2014 FINAL  Final  Culture, blood (x 2)     Status: None   Collection Time: 04/17/14 12:28 PM  Result Value Ref Range Status   Specimen Description BLOOD RIGHT ANTECUBITAL  Final   Special Requests BOTTLES DRAWN AEROBIC AND ANAEROBIC 10CC  Final   Culture   Final    NO GROWTH 5 DAYS Performed at [facility]    Report Status 04/23/2014 FINAL  Final  Culture, sputum-assessment     Status: None   Collection Time: 04/17/14  1:20 PM  Result Value Ref Range Status   Specimen Description SPUTUM  Final   Special Requests NONE  Final   Sputum evaluation   Final    MICROSCOPIC FINDINGS SUGGEST THAT THIS SPECIMEN IS NOT REPRESENTATIVE OF LOWER RESPIRATORY SECRETIONS. PLEASE RECOLLECT. REPORT CALLED TO L.SATTERFIELD,RN 04/17/14 1354 BY BSLADE    Report Status 04/17/2014 FINAL  Final  Culture, expectorated sputum-assessment     Status: None   Collection Time: 04/17/14  5:38 PM  Result Value Ref Range Status   Specimen Description SPUTUM  Final   Special Requests Immunocompromised  Final   Sputum evaluation   Final    MICROSCOPIC FINDINGS SUGGEST THAT THIS SPECIMEN IS NOT REPRESENTATIVE OF LOWER RESPIRATORY SECRETIONS. PLEASE RECOLLECT. CALLED TO R.SARINE,RN AT 2148 BY L.PITT  04/17/14    Report Status 04/17/2014 FINAL   Final  Clostridium Difficile by PCR     Status: None   Collection Time: 04/17/14  9:10 PM  Result Value Ref Range Status   C difficile by pcr NEGATIVE NEGATIVE Final     Labs: Basic Metabolic Panel:  Recent Labs Lab 04/17/14 0404 04/18/14 0307 04/19/14 0424 04/23/14 0525  NA 136 141 140 139  K 3.6 3.0* 3.9 4.1  CL 104 119* 111 107  CO2 24 18* 26 29  GLUCOSE 107* 88 120* 124*  BUN 21 11 11 15   CREATININE 0.95 0.64 0.75 1.12  CALCIUM 8.9 6.7* 8.1* 8.9  MG  --   --  1.8  --   PHOS  --   --  2.7  --    Liver Function Tests:  Recent Labs Lab 04/17/14 0404  AST 24  ALT 8  ALKPHOS 58  BILITOT 0.7  PROT 6.2  ALBUMIN 2.6*   No results for input(s): LIPASE, AMYLASE in the last 168 hours. No results for input(s): AMMONIA in the last 168 hours. CBC:  Recent Labs Lab 04/17/14 0557 04/18/14 0307 04/19/14 0424 04/21/14 0447  WBC 4.0 2.7* 3.4* 3.8*  NEUTROABS 3.0  --   --   --   HGB 8.2* 7.3* 8.4* 8.9*  HCT 25.4* 22.8* 26.0* 27.6*  MCV 83.6 84.1 84.7 84.1  PLT 94* 85* 116* 137*   Cardiac Enzymes:  Recent Labs Lab 04/17/14 1228  TROPONINI 0.03   BNP: BNP (last 3 results)  Recent Labs  04/09/14 1918  BNP 1434.7*    ProBNP (last 3 results) No results for input(s): PROBNP in the last 8760 hours.  CBG:  Recent Labs Lab 04/21/14 2041 04/22/14 0041 04/22/14 0406 04/22/14 0728 04/23/14 1202  GLUCAP 106* 92 108* 127* 82  SignedEulogio Bear  [facility] 04/23/2014, 2:27 PM

## 2014-04-23 NOTE — Discharge Planning (Addendum)
Patient will discharge today per MD order. Patient will discharge to [facility] RN to call report prior to transportation to [phone] Transportation: [place] sent discharge summary to SNF for review.  Packet is complete.  RN, patient and family aware of discharge plans. CSW spoke with daughter, [name] to review discharge plans.  [name] was agreeable and appreciative of assistance.  Nonnie Done, [place] [phone]  Psychiatric & Orthopedics (5N 1-16) Clinical Social Worker

## 2014-04-23 NOTE — Progress Notes (Signed)
Patient cbg at 0000 was 127 and at 0400 134

## 2014-04-24 ENCOUNTER — Other Ambulatory Visit: Payer: Self-pay | Admitting: *Deleted

## 2014-04-24 LAB — GLUCOSE, CAPILLARY
GLUCOSE-CAPILLARY: 119 mg/dL — AB (ref 70–99)
GLUCOSE-CAPILLARY: 127 mg/dL — AB (ref 70–99)
Glucose-Capillary: 134 mg/dL — ABNORMAL HIGH (ref 70–99)

## 2014-04-24 MED ORDER — METHYLPHENIDATE HCL 5 MG PO TABS: 5.0000 mg | ORAL_TABLET | Freq: Every day | ORAL | Status: DC

## 2014-04-24 NOTE — Telephone Encounter (Signed)
[facility] 

## 2014-04-25 ENCOUNTER — Non-Acute Institutional Stay (SKILLED_NURSING_FACILITY): Payer: Medicare Other | Admitting: Internal Medicine

## 2014-04-25 DIAGNOSIS — R9431 Abnormal electrocardiogram [ECG] [EKG]: Secondary | ICD-10-CM

## 2014-04-25 DIAGNOSIS — T827XXD Infection and inflammatory reaction due to other cardiac and vascular devices, implants and grafts, subsequent encounter: Secondary | ICD-10-CM

## 2014-04-25 DIAGNOSIS — N183 Chronic kidney disease, stage 3 unspecified: Secondary | ICD-10-CM

## 2014-04-25 DIAGNOSIS — I4581 Long QT syndrome: Secondary | ICD-10-CM

## 2014-04-25 DIAGNOSIS — Z931 Gastrostomy status: Secondary | ICD-10-CM

## 2014-04-25 DIAGNOSIS — E43 Unspecified severe protein-calorie malnutrition: Secondary | ICD-10-CM

## 2014-04-25 DIAGNOSIS — A419 Sepsis, unspecified organism: Secondary | ICD-10-CM

## 2014-04-25 DIAGNOSIS — R1310 Dysphagia, unspecified: Secondary | ICD-10-CM

## 2014-04-25 NOTE — Progress Notes (Signed)
MRN: [id number] Name: [name]  Sex: male Age: 81 y.o. DOB: 1929/04/04  [place] #: Helene Kelp Facility/Room: 116 Level Of Care: SNF Provider: Inocencio Homes D Emergency Contacts: Extended Emergency Contact Information Primary Emergency Contact: Thacker,Brenda Address: [address]          [place], [place] [address] Johnnette Litter of [place] Phone: [phone] Relation: Daughter Secondary Emergency Contact: Titus Dubin, [place] [address] Johnnette Litter of [place] Phone: [phone] Mobile Phone: [phone] Relation: Daughter     Allergies: Atorvastatin and Penicillins  Chief Complaint  Patient presents with  . Readmit To SNF    HPI: Patient is 81 y.o. male who was hospitalized for sepsis and is now admitted to SNF on antibiotics. Pt is FTT.  Past Medical History  Diagnosis Date  . Hypertension   . BPH (benign prostatic hyperplasia)   . Hyperlipidemia   . Cancer 2008-2009    prostate TREATED WITH RADIATION  . AAA (abdominal aortic aneurysm) 04/2012    STENTING OF AAA IN [place]  . Junctional cardiac arrhythmia     Occurred postoperatively after urologic surgery  . Severe sinus bradycardia     Occurred postoperatively after urologic surgery  . Aortic valve disorders   . Other primary cardiomyopathies   . Dysphagia   . Status post insertion of percutaneous endoscopic gastrostomy (PEG) tube 02/11/14  . Bacteremia due to Pseudomonas   . Infected aortic graft   . DVT (deep venous thrombosis) 04/2013    s/p IVC Filter    Past Surgical History  Procedure Laterality Date  . Abdominal surgery      PART OF COLON REMOVED FOR DIVERTICULITIS  . Appendectomy    . Hernia repair    . Bladder surgery  2008    FOR BLADDER STONE  . Total knee arthroplasty  1990    left  . Abdominal aortic aneurysm repair  04/2012  . Total knee arthroplasty Right 07/26/2012    Procedure: RIGHT TOTAL KNEE ARTHROPLASTY;  Surgeon: Tobi Bastos, MD;   Location: [facility];  Service: Orthopedics;  Laterality: Right;  . Nephrolithotomy Left 04/26/2013    Procedure: LEFT PERCUTANEOUS NEPHROLITHOTOMY ;  Surgeon: [name], MD;  Location: [facility];  Service: Urology;  Laterality: Left;  . Nephrolithotomy Left 05/03/2013    Procedure: 2ND STAGE LEFT PERCUTANEOUS NEPHROLITHOTOMY ;  Surgeon: [name], MD;  Location: [facility];  Service: Urology;  Laterality: Left;  . Esophagogastroduodenoscopy N/A 02/11/2014    DEY:CXKGY hiatal hernia. Patchy erythema of the gastric mucosa; otherwise negative EGD. Status post 14 French Microvasive PEG tube placement  . Peg placement N/A 02/11/2014    Procedure: PERCUTANEOUS ENDOSCOPIC GASTROSTOMY (PEG) PLACEMENT;  Surgeon: Daneil Dolin, MD;  Location: [facility];  Service: Endoscopy;  Laterality: N/A;      Medication List       This list is accurate as of: 04/25/14 11:59 PM.  Always use your most recent med list.               acetaminophen 500 MG tablet  Commonly known as:  TYLENOL  Take 1,000 mg by mouth every 6 (six) hours as needed for mild pain or moderate pain.     albuterol (2.5 MG/3ML) 0.083% nebulizer solution  Commonly known as:  PROVENTIL  Take 3 mLs (2.5 mg total) by nebulization every 2 (two) hours as needed for wheezing or shortness of breath.     bisacodyl 10  MG suppository  Commonly known as:  DULCOLAX  Place 10 mg rectally as needed for mild constipation or moderate constipation.     ciprofloxacin 750 MG tablet  Commonly known as:  CIPRO  Take 1 tablet (750 mg total) by mouth 2 (two) times daily.     doxazosin 4 MG tablet  Commonly known as:  CARDURA  Take 4 mg by mouth at bedtime.     famotidine 20 MG tablet  Commonly known as:  PEPCID  Place 1 tablet (20 mg total) into feeding tube at bedtime.     feeding supplement (JEVITY 1.2 CAL) Liqd  Place 1,000 mLs into feeding tube daily.     feeding supplement (PRO-STAT SUGAR FREE 64) Liqd  Take 30 mLs by mouth 3 (three) times daily  with meals.     lactulose 10 GM/15ML solution  Commonly known as:  CHRONULAC  Take 10 g by mouth daily. Every day per MAR     magnesium hydroxide 400 MG/5ML suspension  Commonly known as:  MILK OF MAGNESIA  Take 30 mLs by mouth daily as needed for mild constipation.     magnesium oxide 400 (241.3 MG) MG tablet  Commonly known as:  MAG-OX  Take 0.5 tablets (200 mg total) by mouth daily.     methylphenidate 5 MG tablet  Commonly known as:  RITALIN  Take 1 tablet (5 mg total) by mouth daily.     mirtazapine 30 MG tablet  Commonly known as:  REMERON  Take 30 mg by mouth at bedtime.     simvastatin 20 MG tablet  Commonly known as:  ZOCOR  Take 20 mg by mouth daily.     sodium phosphate enema  Commonly known as:  FLEET  Place 1 enema rectally as needed (for constipation). follow package directions     tolterodine 4 MG 24 hr capsule  Commonly known as:  DETROL LA  Take 4 mg by mouth daily.     triamcinolone cream 0.1 %  Commonly known as:  KENALOG  Apply 1 application topically 2 (two) times daily as needed (rash).        No orders of the defined types were placed in this encounter.     There is no immunization history on file for this patient.  History  Substance Use Topics  . Smoking status: Former Smoker -- 2.00 packs/day for 37 years    Types: Cigarettes    Start date: 02/17/1949    Quit date: 03/15/1985  . Smokeless tobacco: Former Systems developer     Comment: quit 25 years ago  . Alcohol Use: No    Family history is noncontributory    Review of Systems  DATA OBTAINED: from patient GENERAL:  no fevers,+ fatigue SKIN: No itching, rash  EYES: No eye pain, redness, discharge EARS: No earache, tinnitus, change in hearing NOSE: No congestion, drainage or bleeding  MOUTH/THROAT: No mouth or tooth pain, No sore throat RESPIRATORY: No cough, wheezing, SOB CARDIAC: No chest pain, palpitations, lower extremity edema  GI: No abdominal pain, No N/V/D or constipation, No  heartburn or reflux  GU: No dysuria, frequency or urgency, or incontinence  MUSCULOSKELETAL: No unrelieved bone/joint pain NEUROLOGIC: No headache, dizziness  PSYCHIATRIC: No overt anxiety or sadness, No behavior issue.   Filed Vitals:   04/25/14 2013  BP: 128/79  Pulse: 87  Temp: 97.9 F (36.6 C)  Resp: 18    Physical Exam  GENERAL APPEARANCE: Alert, conversant, much weaker than prior  SKIN:  pale HEAD: Normocephalic, atraumatic  EYES: Conjunctiva/lids clear. Pupils round, reactive. EOMs intact.  EARS: External exam WNL, canals clear. Hearing grossly normal.  NOSE: No deformity or discharge.  MOUTH/THROAT: Lips w/o lesions  RESPIRATORY: Breathing is even, unlabored. Lung sounds are clear   CARDIOVASCULAR: Heart RRR no murmurs, rubs or gallops. No peripheral edema.   GASTROINTESTINAL: Abdomen is soft, non-tender, not distended w/ normal bowel sounds. GENITOURINARY: Bladder non tender, not distended  MUSCULOSKELETAL: No abnormal joints or musculature NEUROLOGIC:  Cranial nerves 2-12 grossly intact. Moves all extremities  PSYCHIATRIC: Mood and affect appropriate to situation, no behavioral issues  Patient Active Problem List   Diagnosis Date Noted  . Pyrexia   . QT prolongation   . Mycotic aneurysm   . Pseudomonas infection   . Hx of Clostridium difficile infection   . Bacterial infection due to Proteus mirabilis   . Bacteria in urine 04/11/2014  . Blood poisoning   . Elevated troponin 04/10/2014  . Hypotension 04/09/2014  . FTT (failure to thrive) in adult 03/30/2014  . Hematuria 03/10/2014  . Acute blood loss anemia 03/10/2014  . Counseling regarding advanced directives and goals of care 03/01/2014  . C. difficile diarrhea 02/27/2014  . HCAP (healthcare-associated pneumonia) 02/26/2014  . AAA (abdominal aortic aneurysm)   . Sepsis 02/24/2014  . Fever presenting with conditions classified elsewhere 02/24/2014  . Palliative care encounter   . 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 Recurrent bacteremia 02/22/2014  . CKD (chronic kidney disease), stage III 02/22/2014  . Septic shock 02/21/2014  . History of Bacteremia due to Pseudomonas 02/21/2014  . Infected aortic graft 02/21/2014  . Stage III chronic kidney disease 02/21/2014  . Dysphagia 02/21/2014  . Status post insertion of percutaneous endoscopic gastrostomy (PEG) tube 02/21/2014  . Hypokalemia 02/10/2014  . Protein-calorie malnutrition, severe 02/08/2014  . Rash and nonspecific skin eruption 02/02/2014  . Supratherapeutic INR 05/17/2013  . Anticoagulation management encounter 05/15/2013  . Aortic valve disorder   . Other primary cardiomyopathies   . 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 Thrombocytopenia 04/30/2013  . Renal stone 04/25/2013  . Osteoarthritis of right knee 07/26/2012  . Weakness of right leg 05/19/2012  . HTN (hypertension) 05/19/2012    CBC    Component Value Date/Time   WBC 3.8* 04/21/2014 0447   WBC 5.8 12/26/2013 1105   WBC 5.4 02/28/2007 0919   RBC 3.28* 04/21/2014 0447   RBC 3.62* 02/21/2014 1127   RBC 4.10* 12/26/2013 1105   RBC 5.21 02/28/2007 0919   HGB 8.9* 04/21/2014 0447   HGB 11.0* 12/26/2013 1105   HGB 15.0 02/28/2007 0919   HCT 27.6* 04/21/2014 0447   HCT 34.5* 12/26/2013 1105   HCT 44.0 02/28/2007 0919   PLT 137* 04/21/2014 0447   PLT 157 12/26/2013 1105   PLT 153 02/28/2007 0919   MCV 84.1 04/21/2014 0447   MCV 84 12/26/2013 1105   MCV 84.4 02/28/2007 0919   LYMPHSABS 0.6* 04/17/2014 0557   LYMPHSABS 0.9 12/26/2013 1105   LYMPHSABS 1.6 02/28/2007 0919   MONOABS 0.5 04/17/2014 0557   MONOABS 0.6 02/28/2007 0919   EOSABS 0.1 04/17/2014 0557   EOSABS 0.1 12/26/2013 1105   EOSABS 0.3 02/28/2007 0919   BASOSABS 0.0 04/17/2014 0557   BASOSABS 0.0 12/26/2013 1105   BASOSABS 0.0 02/28/2007 0919    CMP     Component Value  Date/Time  NA 139 04/23/2014 0525   NA 140 10/31/2013 0839   K 4.1 04/23/2014 0525   K 3.9 10/31/2013 0839   CL 107 04/23/2014 0525   CL 103 10/31/2013 0839   CO2 29 04/23/2014 0525   CO2 27 10/31/2013 0839   GLUCOSE 124* 04/23/2014 0525   GLUCOSE 93 10/31/2013 0839   BUN 15 04/23/2014 0525   BUN 17 10/31/2013 0839   CREATININE 1.12 04/23/2014 0525   CREATININE 1.1 10/31/2013 0839   CALCIUM 8.9 04/23/2014 0525   CALCIUM 8.9 10/31/2013 0839   PROT 6.2 04/17/2014 0404   ALBUMIN 2.6* 04/17/2014 0404   AST 24 04/17/2014 0404   ALT 8 04/17/2014 0404   ALKPHOS 58 04/17/2014 0404   BILITOT 0.7 04/17/2014 0404   GFRNONAA 59* 04/23/2014 0525   GFRAA 69* 04/23/2014 0525    Assessment and Plan  Sepsis cx remain negative to date. Repeat CTA abdomen stable. ID stopped vancomycin.  high dose cipro per Dr. Tommy Medal. -will need close monitoring of his Qtc. Stable from 2/8-2/9-- discussed options with daughters: 1. Continue cipro with chance of arrhythmia 2. No abx and monitor for infection 3. PICC and IV Abx They do not want him off abx as they are fearful that his infection will return They understand the risks and benefits of the cipro. They acknowledge that patient is declining rapidly and they are agreeable to have palliative follow at SNF   Infected aortic graft Pseudomonas infection ;presumed cause of sepsis even with neg cx;covered with Cipro now;daughter had stated prior that every time he is oof abx he gets septic   QT prolongation Noted and will need to follow EKG on Cipro   Dysphagia Chronic, not improving;PEG for food and meds   CKD (chronic kidney disease), stage III 2/9  BUN 12/Cr 1.12 which is better than baseline    Status post insertion of percutaneous endoscopic gastrostomy (PEG) tube All meds and food thru tube   Protein-calorie malnutrition, severe FTT; continue protein supplement     Hennie Duos, MD

## 2014-04-26 ENCOUNTER — Other Ambulatory Visit: Payer: Self-pay | Admitting: *Deleted

## 2014-04-26 DIAGNOSIS — D696 Thrombocytopenia, unspecified: Secondary | ICD-10-CM

## 2014-04-27 ENCOUNTER — Encounter: Payer: Self-pay | Admitting: Internal Medicine

## 2014-04-27 NOTE — Assessment & Plan Note (Signed)
Noted and will need to follow EKG on Cipro

## 2014-04-27 NOTE — Assessment & Plan Note (Signed)
FTT; continue protein supplement

## 2014-04-27 NOTE — Assessment & Plan Note (Signed)
cx remain negative to date. Repeat CTA abdomen stable. ID stopped vancomycin.  high dose cipro per Dr. Tommy Medal. -will need close monitoring of his Qtc. Stable from 2/8-2/9-- discussed options with daughters: 1. Continue cipro with chance of arrhythmia 2. No abx and monitor for infection 3. PICC and IV Abx They do not want him off abx as they are fearful that his infection will return They understand the risks and benefits of the cipro. They acknowledge that patient is declining rapidly and they are agreeable to have palliative follow at [facility]

## 2014-04-27 NOTE — Assessment & Plan Note (Signed)
2/9  BUN 12/Cr 1.12 which is better than baseline

## 2014-04-27 NOTE — Assessment & Plan Note (Signed)
Chronic, not improving;PEG for food and meds

## 2014-04-27 NOTE — Assessment & Plan Note (Signed)
Pseudomonas infection ;presumed cause of sepsis even with neg cx;covered with Cipro now;daughter had stated prior that every time he is oof abx he gets septic

## 2014-04-27 NOTE — Assessment & Plan Note (Signed)
All meds and food thru tube

## 2014-04-29 ENCOUNTER — Encounter (HOSPITAL_COMMUNITY): Payer: Self-pay | Admitting: *Deleted

## 2014-04-29 ENCOUNTER — Telehealth: Payer: Self-pay | Admitting: *Deleted

## 2014-04-29 ENCOUNTER — Emergency Department (HOSPITAL_COMMUNITY): Payer: Medicare Other

## 2014-04-29 ENCOUNTER — Inpatient Hospital Stay (HOSPITAL_COMMUNITY)
Admission: EM | Admit: 2014-04-29 | Discharge: 2014-05-07 | DRG: 871 | Disposition: A | Discharge status: Home Health | Payer: Medicare Other | Attending: Internal Medicine | Admitting: Internal Medicine

## 2014-04-29 ENCOUNTER — Other Ambulatory Visit: Payer: Medicare Other | Admitting: Lab

## 2014-04-29 ENCOUNTER — Ambulatory Visit: Payer: Medicare Other | Admitting: Hematology & Oncology

## 2014-04-29 ENCOUNTER — Inpatient Hospital Stay (HOSPITAL_COMMUNITY): Payer: Medicare Other

## 2014-04-29 DIAGNOSIS — E440 Moderate protein-calorie malnutrition: Secondary | ICD-10-CM | POA: Diagnosis present

## 2014-04-29 DIAGNOSIS — Y832 Surgical operation with anastomosis, bypass or graft as the cause of abnormal reaction of the patient, or of later complication, without mention of misadventure at the time of the procedure: Secondary | ICD-10-CM | POA: Diagnosis present

## 2014-04-29 DIAGNOSIS — R7881 Bacteremia: Secondary | ICD-10-CM | POA: Diagnosis present

## 2014-04-29 DIAGNOSIS — Z79899 Other long term (current) drug therapy: Secondary | ICD-10-CM | POA: Diagnosis not present

## 2014-04-29 DIAGNOSIS — A4159 Other Gram-negative sepsis: Secondary | ICD-10-CM | POA: Diagnosis present

## 2014-04-29 DIAGNOSIS — Z87891 Personal history of nicotine dependence: Secondary | ICD-10-CM | POA: Diagnosis not present

## 2014-04-29 DIAGNOSIS — Z8546 Personal history of malignant neoplasm of prostate: Secondary | ICD-10-CM

## 2014-04-29 DIAGNOSIS — D61818 Other pancytopenia: Secondary | ICD-10-CM | POA: Diagnosis not present

## 2014-04-29 DIAGNOSIS — B965 Pseudomonas (aeruginosa) (mallei) (pseudomallei) as the cause of diseases classified elsewhere: Secondary | ICD-10-CM | POA: Diagnosis present

## 2014-04-29 DIAGNOSIS — I429 Cardiomyopathy, unspecified: Secondary | ICD-10-CM | POA: Diagnosis present

## 2014-04-29 DIAGNOSIS — N183 Chronic kidney disease, stage 3 (moderate): Secondary | ICD-10-CM | POA: Diagnosis present

## 2014-04-29 DIAGNOSIS — R9431 Abnormal electrocardiogram [ECG] [EKG]: Secondary | ICD-10-CM | POA: Diagnosis present

## 2014-04-29 DIAGNOSIS — H9190 Unspecified hearing loss, unspecified ear: Secondary | ICD-10-CM | POA: Diagnosis present

## 2014-04-29 DIAGNOSIS — R1310 Dysphagia, unspecified: Secondary | ICD-10-CM | POA: Diagnosis present

## 2014-04-29 DIAGNOSIS — R6520 Severe sepsis without septic shock: Secondary | ICD-10-CM | POA: Diagnosis present

## 2014-04-29 DIAGNOSIS — E785 Hyperlipidemia, unspecified: Secondary | ICD-10-CM | POA: Diagnosis present

## 2014-04-29 DIAGNOSIS — Z8679 Personal history of other diseases of the circulatory system: Secondary | ICD-10-CM

## 2014-04-29 DIAGNOSIS — I350 Nonrheumatic aortic (valve) stenosis: Secondary | ICD-10-CM | POA: Diagnosis present

## 2014-04-29 DIAGNOSIS — Z792 Long term (current) use of antibiotics: Secondary | ICD-10-CM

## 2014-04-29 DIAGNOSIS — I5022 Chronic systolic (congestive) heart failure: Secondary | ICD-10-CM | POA: Diagnosis present

## 2014-04-29 DIAGNOSIS — I4581 Long QT syndrome: Secondary | ICD-10-CM | POA: Diagnosis present

## 2014-04-29 DIAGNOSIS — I129 Hypertensive chronic kidney disease with stage 1 through stage 4 chronic kidney disease, or unspecified chronic kidney disease: Secondary | ICD-10-CM | POA: Diagnosis present

## 2014-04-29 DIAGNOSIS — Z923 Personal history of irradiation: Secondary | ICD-10-CM

## 2014-04-29 DIAGNOSIS — Z95828 Presence of other vascular implants and grafts: Secondary | ICD-10-CM

## 2014-04-29 DIAGNOSIS — I4892 Unspecified atrial flutter: Secondary | ICD-10-CM | POA: Diagnosis present

## 2014-04-29 DIAGNOSIS — Z9889 Other specified postprocedural states: Secondary | ICD-10-CM

## 2014-04-29 DIAGNOSIS — Z96651 Presence of right artificial knee joint: Secondary | ICD-10-CM | POA: Diagnosis present

## 2014-04-29 DIAGNOSIS — Z6826 Body mass index (BMI) 26.0-26.9, adult: Secondary | ICD-10-CM | POA: Diagnosis not present

## 2014-04-29 DIAGNOSIS — I714 Abdominal aortic aneurysm, without rupture, unspecified: Secondary | ICD-10-CM | POA: Diagnosis present

## 2014-04-29 DIAGNOSIS — I959 Hypotension, unspecified: Secondary | ICD-10-CM | POA: Diagnosis present

## 2014-04-29 DIAGNOSIS — Z96652 Presence of left artificial knee joint: Secondary | ICD-10-CM | POA: Diagnosis present

## 2014-04-29 DIAGNOSIS — A419 Sepsis, unspecified organism: Secondary | ICD-10-CM | POA: Diagnosis present

## 2014-04-29 DIAGNOSIS — T827XXA Infection and inflammatory reaction due to other cardiac and vascular devices, implants and grafts, initial encounter: Secondary | ICD-10-CM | POA: Diagnosis present

## 2014-04-29 DIAGNOSIS — Z86718 Personal history of other venous thrombosis and embolism: Secondary | ICD-10-CM

## 2014-04-29 DIAGNOSIS — R627 Adult failure to thrive: Secondary | ICD-10-CM | POA: Diagnosis present

## 2014-04-29 DIAGNOSIS — A0471 Enterocolitis due to Clostridium difficile, recurrent: Secondary | ICD-10-CM

## 2014-04-29 DIAGNOSIS — Z931 Gastrostomy status: Secondary | ICD-10-CM

## 2014-04-29 DIAGNOSIS — I501 Left ventricular failure: Secondary | ICD-10-CM | POA: Insufficient documentation

## 2014-04-29 DIAGNOSIS — J189 Pneumonia, unspecified organism: Secondary | ICD-10-CM | POA: Diagnosis present

## 2014-04-29 DIAGNOSIS — I10 Essential (primary) hypertension: Secondary | ICD-10-CM | POA: Diagnosis present

## 2014-04-29 DIAGNOSIS — E43 Unspecified severe protein-calorie malnutrition: Secondary | ICD-10-CM | POA: Diagnosis present

## 2014-04-29 DIAGNOSIS — A498 Other bacterial infections of unspecified site: Secondary | ICD-10-CM | POA: Diagnosis present

## 2014-04-29 DIAGNOSIS — I950 Idiopathic hypotension: Secondary | ICD-10-CM

## 2014-04-29 DIAGNOSIS — R509 Fever, unspecified: Secondary | ICD-10-CM | POA: Diagnosis present

## 2014-04-29 DIAGNOSIS — D696 Thrombocytopenia, unspecified: Secondary | ICD-10-CM | POA: Diagnosis present

## 2014-04-29 LAB — URINE MICROSCOPIC-ADD ON

## 2014-04-29 LAB — CBC WITH DIFFERENTIAL/PLATELET
Basophils Absolute: 0 10*3/uL (ref 0.0–0.1)
Basophils Relative: 0 % (ref 0–1)
Eosinophils Absolute: 0 10*3/uL (ref 0.0–0.7)
Eosinophils Relative: 0 % (ref 0–5)
HCT: 30.7 % — ABNORMAL LOW (ref 39.0–52.0)
Hemoglobin: 10.3 g/dL — ABNORMAL LOW (ref 13.0–17.0)
LYMPHS ABS: 0.4 10*3/uL — AB (ref 0.7–4.0)
Lymphocytes Relative: 8 % — ABNORMAL LOW (ref 12–46)
MCH: 27.7 pg (ref 26.0–34.0)
MCHC: 33.6 g/dL (ref 30.0–36.0)
MCV: 82.5 fL (ref 78.0–100.0)
MONO ABS: 0.8 10*3/uL (ref 0.1–1.0)
MONOS PCT: 14 % — AB (ref 3–12)
NEUTROS ABS: 4.2 10*3/uL (ref 1.7–7.7)
Neutrophils Relative %: 77 % (ref 43–77)
Platelets: 114 10*3/uL — ABNORMAL LOW (ref 150–400)
RBC: 3.72 MIL/uL — AB (ref 4.22–5.81)
RDW: 17.1 % — ABNORMAL HIGH (ref 11.5–15.5)
WBC: 5.5 10*3/uL (ref 4.0–10.5)

## 2014-04-29 LAB — COMPREHENSIVE METABOLIC PANEL
ALT: 12 U/L (ref 0–53)
ANION GAP: 6 (ref 5–15)
AST: 26 U/L (ref 0–37)
Albumin: 2.4 g/dL — ABNORMAL LOW (ref 3.5–5.2)
Alkaline Phosphatase: 57 U/L (ref 39–117)
BUN: 21 mg/dL (ref 6–23)
CALCIUM: 9.1 mg/dL (ref 8.4–10.5)
CO2: 25 mmol/L (ref 19–32)
CREATININE: 1.16 mg/dL (ref 0.50–1.35)
Chloride: 103 mmol/L (ref 96–112)
GFR calc Af Amer: 66 mL/min — ABNORMAL LOW (ref >90)
GFR, EST NON AFRICAN AMERICAN: 57 mL/min — AB (ref >90)
GLUCOSE: 121 mg/dL — AB (ref 70–99)
POTASSIUM: 4.1 mmol/L (ref 3.5–5.1)
SODIUM: 134 mmol/L — AB (ref 135–145)
Total Bilirubin: 0.4 mg/dL (ref 0.3–1.2)
Total Protein: 6.1 g/dL (ref 6.0–8.3)

## 2014-04-29 LAB — LACTIC ACID, PLASMA: Lactic Acid, Venous: 2 mmol/L (ref 0.5–2.0)

## 2014-04-29 LAB — URINALYSIS, ROUTINE W REFLEX MICROSCOPIC
BILIRUBIN URINE: NEGATIVE
Glucose, UA: NEGATIVE mg/dL
Ketones, ur: 15 mg/dL — AB
Nitrite: NEGATIVE
Protein, ur: 30 mg/dL — AB
Specific Gravity, Urine: 1.022 (ref 1.005–1.030)
Urobilinogen, UA: 1 mg/dL (ref 0.0–1.0)
pH: 5 (ref 5.0–8.0)

## 2014-04-29 LAB — STREP PNEUMONIAE URINARY ANTIGEN: STREP PNEUMO URINARY ANTIGEN: NEGATIVE

## 2014-04-29 LAB — I-STAT CG4 LACTIC ACID, ED: Lactic Acid, Venous: 2.05 mmol/L (ref 0.5–2.0)

## 2014-04-29 LAB — MAGNESIUM: Magnesium: 1.7 mg/dL (ref 1.5–2.5)

## 2014-04-29 MED ORDER — SODIUM CHLORIDE 0.9 % IV SOLN
INTRAVENOUS | Status: AC
  Administered 2014-04-29 – 2014-04-30 (×2): via INTRAVENOUS

## 2014-04-29 MED ORDER — FAMOTIDINE 20 MG PO TABS
20.0000 mg | ORAL_TABLET | Freq: Every day | ORAL | Status: DC
  Administered 2014-04-29: 20 mg
  Filled 2014-04-29 (×2): qty 1

## 2014-04-29 MED ORDER — DEXTROSE 5 % IV SOLN
2.0000 g | Freq: Three times a day (TID) | INTRAVENOUS | Status: DC
  Administered 2014-04-29 – 2014-05-03 (×12): 2 g via INTRAVENOUS
  Filled 2014-04-29 (×17): qty 2

## 2014-04-29 MED ORDER — FESOTERODINE FUMARATE ER 4 MG PO TB24
4.0000 mg | ORAL_TABLET | Freq: Every day | ORAL | Status: DC
  Filled 2014-04-29 (×2): qty 1

## 2014-04-29 MED ORDER — JEVITY 1.2 CAL PO LIQD
1000.0000 mL | ORAL | Status: DC
  Filled 2014-04-29 (×3): qty 1000

## 2014-04-29 MED ORDER — ALBUTEROL SULFATE (2.5 MG/3ML) 0.083% IN NEBU: 2.5000 mg | INHALATION_SOLUTION | RESPIRATORY_TRACT | Status: DC | PRN

## 2014-04-29 MED ORDER — DEXTROSE 5 % IV SOLN
2.0000 g | INTRAVENOUS | Status: DC
  Administered 2014-04-29: 2 g via INTRAVENOUS
  Filled 2014-04-29: qty 2

## 2014-04-29 MED ORDER — SODIUM CHLORIDE 0.9 % IV BOLUS (SEPSIS)
1000.0000 mL | Freq: Once | INTRAVENOUS | Status: AC
  Administered 2014-04-29: 1000 mL via INTRAVENOUS

## 2014-04-29 MED ORDER — SODIUM CHLORIDE 0.9 % IV BOLUS (SEPSIS): 1000.0000 mL | Freq: Once | INTRAVENOUS | Status: AC

## 2014-04-29 MED ORDER — MAGNESIUM OXIDE 400 (241.3 MG) MG PO TABS
200.0000 mg | ORAL_TABLET | Freq: Every day | ORAL | Status: DC
Start: 2014-04-29 — End: 2014-05-07
  Administered 2014-04-29 – 2014-05-07 (×9): 200 mg via ORAL
  Filled 2014-04-29 (×9): qty 0.5

## 2014-04-29 MED ORDER — JEVITY 1.2 CAL PO LIQD
1000.0000 mL | ORAL | Status: DC
  Filled 2014-04-29 (×2): qty 1000

## 2014-04-29 MED ORDER — METHYLPHENIDATE HCL 5 MG PO TABS
5.0000 mg | ORAL_TABLET | Freq: Every day | ORAL | Status: DC
  Administered 2014-04-29 – 2014-05-07 (×9): 5 mg via ORAL
  Filled 2014-04-29 (×9): qty 1

## 2014-04-29 MED ORDER — PRO-STAT SUGAR FREE PO LIQD
30.0000 mL | Freq: Three times a day (TID) | ORAL | Status: DC
  Administered 2014-04-29 – 2014-05-06 (×13): 30 mL via ORAL
  Filled 2014-04-29 (×23): qty 30

## 2014-04-29 MED ORDER — ACETAMINOPHEN 500 MG PO TABS: 1000.0000 mg | ORAL_TABLET | Freq: Once | ORAL | Status: DC

## 2014-04-29 MED ORDER — DISPOSABLE ENEMA 19-7 GM/118ML RE ENEM: 1.0000 | ENEMA | RECTAL | Status: DC | PRN

## 2014-04-29 MED ORDER — SIMVASTATIN 20 MG PO TABS
20.0000 mg | ORAL_TABLET | Freq: Every day | ORAL | Status: DC
  Administered 2014-04-29 – 2014-05-07 (×9): 20 mg via ORAL
  Filled 2014-04-29 (×9): qty 1

## 2014-04-29 MED ORDER — MIRTAZAPINE 30 MG PO TABS
30.0000 mg | ORAL_TABLET | Freq: Every day | ORAL | Status: DC
  Administered 2014-04-29: 30 mg via ORAL
  Filled 2014-04-29 (×2): qty 1

## 2014-04-29 MED ORDER — FLEET ENEMA 7-19 GM/118ML RE ENEM
1.0000 | ENEMA | Freq: Every day | RECTAL | Status: DC | PRN
  Filled 2014-04-29: qty 1

## 2014-04-29 MED ORDER — VANCOMYCIN HCL 10 G IV SOLR
1250.0000 mg | INTRAVENOUS | Status: DC
  Administered 2014-04-30 – 2014-05-02 (×3): 1250 mg via INTRAVENOUS
  Filled 2014-04-29 (×3): qty 1250

## 2014-04-29 MED ORDER — LACTULOSE 10 GM/15ML PO SOLN
10.0000 g | Freq: Every day | ORAL | Status: DC
  Administered 2014-04-29 – 2014-05-07 (×5): 10 g via ORAL
  Filled 2014-04-29 (×9): qty 15

## 2014-04-29 MED ORDER — MAGNESIUM HYDROXIDE 400 MG/5ML PO SUSP: 30.0000 mL | Freq: Every day | ORAL | Status: DC | PRN

## 2014-04-29 MED ORDER — ACETAMINOPHEN 160 MG/5ML PO SOLN
500.0000 mg | Freq: Once | ORAL | Status: AC
  Administered 2014-04-29: 500 mg via ORAL
  Filled 2014-04-29: qty 20.3

## 2014-04-29 MED ORDER — VANCOMYCIN HCL 10 G IV SOLR
2000.0000 mg | Freq: Once | INTRAVENOUS | Status: AC
  Administered 2014-04-29: 2000 mg via INTRAVENOUS
  Filled 2014-04-29: qty 2000

## 2014-04-29 MED ORDER — ENOXAPARIN SODIUM 40 MG/0.4ML ~~LOC~~ SOLN
40.0000 mg | SUBCUTANEOUS | Status: DC
  Administered 2014-04-29: 40 mg via SUBCUTANEOUS
  Filled 2014-04-29 (×3): qty 0.4

## 2014-04-29 MED ORDER — BISACODYL 10 MG RE SUPP: 10.0000 mg | RECTAL | Status: DC | PRN

## 2014-04-29 MED ORDER — IOHEXOL 350 MG/ML SOLN
100.0000 mL | Freq: Once | INTRAVENOUS | Status: AC | PRN
  Administered 2014-04-29: 100 mL via INTRAVENOUS

## 2014-04-29 MED ORDER — ACETAMINOPHEN 325 MG PO TABS: 650.0000 mg | ORAL_TABLET | Freq: Four times a day (QID) | ORAL | Status: DC | PRN

## 2014-04-29 NOTE — H&P (Addendum)
[facility] History and Physical  [name] IFO:[id number] DOB: Sep 05, 1929 DOA: 04/29/2014  Referring physician: Dr Mingo Amber PCP: [name], MD   Chief Complaint: -Sent from skilled nursing facility for fever.  -Code sepsis  HPI:  81 year old male with history of AAA status post repair and graft infection causing chronic endovascular leak and infection with prolonged antibiotic course. (Last course of treatment was on 03/07/2014), Pseudomonas bacteremia, dysphagia with recurrent aspiration requiring PEG tube placement, history of DVT status post IVC filter placement, hypertension, atrial flutter, dyslipidemia, chronic systolic heart failure with EF of 30% as per recent echo , with recurrent hospitalization in the past 2 months ( this is his 6th hospitalization in last 3 months mostly related to pneumonia and/or sepsis ) . Patient was recently discharged after being hospitalized for her next pain sepsis negative cultures and imaging. He was discharged on high-dose oral ciprofloxacin (750 mg twice daily) with plan on palliative care follow-up at skilled nursing facility.  As per the son when he went to visit patient this morning. He was found to be having chills and febrile. Patient reports having an episode of nonbloody vomiting yesterday morning.  He also reports having productive cough for the last 2-3 days. Patient has been having small amount of food at the facility besides being on PEG feeds during the night. Patient denies headache, dizziness, chest pain, palpitations, SOB, abdominal pain, bowel or urinary symptoms. At baseline he is nonambulatory.  In the ED patient was found to be septic with temperature of 104.6 Fahrenheit, pulse of 125, respiratory rate of 32, blood pressure of 81/51 mmHg.  Blood work done showed WBC of 5.5, hemoglobin of 10.3, platelets of 114, normal chemistry, glucose of 121. Lactic acid was elevated to 2.5 UA was cloudy with moderate hemoglobin and  positive leucocyte esterase. Chest x-ray showed possible left lower lobe infiltrate.  EKG showed sinus tachycardia at 125 with anterior T-wave inversion and prolonged QTC of 591.  Sepsis was called and patient given empiric IV vancomycin and cefepime. He was given 3 L IV normal saline bolus after which blood pressure improved to 107/56 mmHg. Critical care was consulted and recommended admission to stepdown unit as blood pressure responded to IV fluids. Hospitalist admission requested.  Review of Systems:  Constitutional:fever, chills, denies diaphoresis,  and fatigue.  HEENT: Denies photophobia, eye pain, redness,ear pain, congestion, sore throat, rhinorrhea, sneezing, mouth sores, trouble swallowing, neck pain, neck stiffness and tinnitus.   Respiratory: Productive cough of whitish sputum, , Denies SOB, DOE,  chest tightness,  and wheezing.   Cardiovascular: Denies chest pain, palpitations and leg swelling.  Gastrointestinal: nausea, vomiting+, denies abdominal pain, diarrhea, constipation, blood in stool and abdominal distention.  Genitourinary: Denies dysuria, urgency, frequency, hematuria, flank pain and difficulty urinating.  Endocrine: Denies: hot or cold intolerance,  polyuria, polydipsia. Musculoskeletal: Denies myalgias, back pain, joint pain Skin: Denies rash and wound.  Neurological: Denies dizziness, seizures, syncope, weakness, light-headedness, numbness and headaches.  Hematological: Denies adenopathy Psychiatric/Behavioral: Denies  confusion,  sleep disturbance    Past Medical History  Diagnosis Date  . Hypertension   . BPH (benign prostatic hyperplasia)   . Hyperlipidemia   . Cancer 2008-2009    prostate TREATED WITH RADIATION  . AAA (abdominal aortic aneurysm) 04/2012    STENTING OF AAA IN [place]  . Junctional cardiac arrhythmia     Occurred postoperatively after urologic surgery  . Severe sinus bradycardia     Occurred postoperatively after urologic surgery  .  Aortic valve disorders   . Other primary cardiomyopathies   . Dysphagia   . Status post insertion of percutaneous endoscopic gastrostomy (PEG) tube 02/11/14  . Bacteremia due to Pseudomonas   . Infected aortic graft   . DVT (deep venous thrombosis) 04/2013    s/p IVC Filter   Past Surgical History  Procedure Laterality Date  . Abdominal surgery      PART OF COLON REMOVED FOR DIVERTICULITIS  . Appendectomy    . Hernia repair    . Bladder surgery  2008    FOR BLADDER STONE  . Total knee arthroplasty  1990    left  . Abdominal aortic aneurysm repair  04/2012  . Total knee arthroplasty Right 07/26/2012    Procedure: RIGHT TOTAL KNEE ARTHROPLASTY;  Surgeon: Tobi Bastos, MD;  Location: [facility];  Service: Orthopedics;  Laterality: Right;  . Nephrolithotomy Left 04/26/2013    Procedure: LEFT PERCUTANEOUS NEPHROLITHOTOMY ;  Surgeon: [name], MD;  Location: [facility];  Service: Urology;  Laterality: Left;  . Nephrolithotomy Left 05/03/2013    Procedure: 2ND STAGE LEFT PERCUTANEOUS NEPHROLITHOTOMY ;  Surgeon: [name], MD;  Location: [facility];  Service: Urology;  Laterality: Left;  . Esophagogastroduodenoscopy N/A 02/11/2014    [id number] hiatal hernia. Patchy erythema of the gastric mucosa; otherwise negative EGD. Status post 80 French Microvasive PEG tube placement  . Peg placement N/A 02/11/2014    Procedure: PERCUTANEOUS ENDOSCOPIC GASTROSTOMY (PEG) PLACEMENT;  Surgeon: Daneil Dolin, MD;  Location: [facility];  Service: Endoscopy;  Laterality: N/A;   Social History:  reports that he quit smoking about 29 years ago. His smoking use included Cigarettes. He started smoking about 65 years ago. He has a 74 pack-year smoking history. He has quit using smokeless tobacco. He reports that he does not drink alcohol or use illicit drugs.  Allergies  Allergen Reactions  . Atorvastatin Other (See Comments)    Causes constipation  . Penicillins Other (See Comments)    Other reaction(s):  RASH HIVES     Family History  Problem Relation Age of Onset  . Lung cancer Father     Prior to Admission medications   Medication Sig Start Date End Date Taking? Authorizing Provider  acetaminophen (TYLENOL) 500 MG tablet Take 1,000 mg by mouth every 6 (six) hours as needed for mild pain or moderate pain.   Yes Historical Provider, MD  albuterol (PROVENTIL) (2.5 MG/3ML) 0.083% nebulizer solution Take 3 mLs (2.5 mg total) by nebulization every 2 (two) hours as needed for wheezing or shortness of breath. 02/28/14  Yes Marianne L York, PA-C  Amino Acids-Protein Hydrolys (FEEDING SUPPLEMENT, PRO-STAT SUGAR FREE 64,) LIQD Take 30 mLs by mouth 3 (three) times daily with meals. 03/27/14  Yes Maryann Mikhail, DO  bisacodyl (DULCOLAX) 10 MG suppository Place 10 mg rectally as needed for mild constipation or moderate constipation.   Yes Historical Provider, MD  doxazosin (CARDURA) 4 MG tablet Take 4 mg by mouth at bedtime.   Yes Historical Provider, MD  famotidine (PEPCID) 20 MG tablet Place 1 tablet (20 mg total) into feeding tube at bedtime. 04/23/14  Yes Geradine Girt, DO  lactulose (CHRONULAC) 10 GM/15ML solution Take 10 g by mouth daily. Every day per [facility]   Yes Historical Provider, MD  magnesium hydroxide (MILK OF MAGNESIA) 400 MG/5ML suspension Take 30 mLs by mouth daily as needed for mild constipation.   Yes Historical Provider, MD  magnesium oxide (MAG-OX) 400 (241.3 MG) MG tablet  Take 0.5 tablets (200 mg total) by mouth daily. 04/23/14  Yes Geradine Girt, DO  methylphenidate (RITALIN) 5 MG tablet Take 1 tablet (5 mg total) by mouth daily. 04/24/14  Yes [employer], DO  mirtazapine (REMERON) 30 MG tablet Take 30 mg by mouth at bedtime.   Yes Historical Provider, MD  Nutritional Supplements (FEEDING SUPPLEMENT, JEVITY 1.2 CAL,) LIQD Place 1,000 mLs into feeding tube daily. 04/23/14  Yes Geradine Girt, DO  simvastatin (ZOCOR) 20 MG tablet Take 20 mg by mouth daily.   Yes Historical  Provider, MD  sodium phosphate (FLEET) enema Place 1 enema rectally as needed (for constipation). follow package directions   Yes Historical Provider, MD  tolterodine (DETROL LA) 4 MG 24 hr capsule Take 4 mg by mouth daily.   Yes Historical Provider, MD  triamcinolone cream (KENALOG) 0.1 % Apply 1 application topically 2 (two) times daily as needed (rash).   Yes Historical Provider, MD  ciprofloxacin (CIPRO) 750 MG tablet Take 1 tablet (750 mg total) by mouth 2 (two) times daily. 04/23/14   Geradine Girt, DO     Physical Exam:  Filed Vitals:   04/29/14 1300 04/29/14 1315 04/29/14 1330 04/29/14 1340  BP: 107/56 96/51 91/53    Pulse: 97 93 90   Temp:      TempSrc:      Resp: 16 14    Height:      Weight:      SpO2:    95%    Constitutional: Vital signs reviewed. Elderly male lying in bed in no acute distress HEENT: no pallor, no icterus, moist oral mucosa, no cervical lymphadenopathy, neck supple Cardiovascular: S1 and S2 tachycardic, no murmurs rub or gallop Chest: bibasilar crackles Gastrointestinal: Soft. Non-tender, non-distended, bowel sounds are normal, no guarding or rigidity, PEG tube in place  GU: no CVA tenderness  musculoskeletal: warm, no edema Neurological: Alert and oriented, nonfocal  Labs on Admission:  Basic Metabolic Panel:  Recent Labs Lab 04/23/14 0525 04/29/14 1107  NA 139 134*  K 4.1 4.1  CL 107 103  CO2 29 25  GLUCOSE 124* 121*  BUN 15 21  CREATININE 1.12 1.16  CALCIUM 8.9 9.1   Liver Function Tests:  Recent Labs Lab 04/29/14 1107  AST 26  ALT 12  ALKPHOS 57  BILITOT 0.4  PROT 6.1  ALBUMIN 2.4*   No results for input(s): LIPASE, AMYLASE in the last 168 hours. No results for input(s): AMMONIA in the last 168 hours. CBC:  Recent Labs Lab 04/29/14 1107  WBC 5.5  NEUTROABS 4.2  HGB 10.3*  HCT 30.7*  MCV 82.5  PLT 114*   Cardiac Enzymes: No results for input(s): CKTOTAL, CKMB, CKMBINDEX, TROPONINI in the last 168  hours. BNP: Invalid input(s): POCBNP CBG:  Recent Labs Lab 04/22/14 1945 04/22/14 2350 04/23/14 0359 04/23/14 0750 04/23/14 1202  GLUCAP 110* 127* 134* 119* 82    Radiological Exams on Admission: Dg Chest Port 1 View  04/29/2014   CLINICAL DATA:  Elevated temperature.  Sepsis.  Hypertension  EXAM: PORTABLE CHEST - 1 VIEW  COMPARISON:  04/19/2014  FINDINGS: Elevated left hemidiaphragm. Mild bilateral interstitial prominence. Hazy left lower lobe airspace disease. No pleural effusion or pneumothorax. Stable cardiomediastinal silhouette. Descending thoracic aortic stent graft. No acute osseous abnormality.  IMPRESSION: Cardiomegaly with mild CHF. Hazy left lower lobe airspace disease which may reflect atelectasis versus pneumonia.   Electronically Signed   By: Kathreen Devoid   On: 04/29/2014 11:26  EKG: Sinus tachycardia at 125, T-wave inversion in anterior leads, prolonged QTC of 591  Assessment/Plan  Principal Problem:   Sepsis Possible HCAP. Admit to stepdown for closer monitoring. -IV hydration with normal saline at 100 mL/h. Patient received 2 L IV normal saline bolus in the ED. -We placed on empiric vancomycin and aztreonam. (Received one dose of IV vancomycin and cefepime in the ED). Follow repeat blood culture, urine culture. Monitor repeat lactate. -Spoke with ID consult Dr. Lucianne Lei dam. Given recurrent specialization for sepsis is no clear underlying etiology. Recommended to repeat CT angiogram of the chest and abdomen to evaluate for graft infection. (Patient had CT angiogram of the abdomen 10 days back which was negative for any infection). -Patient on high-dose oral ciprofloxacin upon recent discharge. -Consult ID following culture and imaging results.  Active Problems:   HCAP (healthcare-associated pneumonia) Empiric antibiotics as outlined above. Follow cultures. Check sputum culture, urine for strep antigen and Legionella antigen.  Congestive heart failure EF of 30  separate as per recent echo. Patient currently euvolemic. Monitor with IV hydration. Check strict  I/O and daily weight. Hold blood pressure medications.   Chronic dysphagia Continue PEG tube feeding. Nutrition consulted. Will hold off on oral intake.  ?UTI Follow urine culture.  History of C. Difficile Recent stool for c diff negative. Monitor for now while on antibiotics.  Prolonged QTC Hold ciprofloxacin. Check magnesium level. Monitor on telemetry.    History of DVT of lower extremity (Feb 2015) -post IVC filter   Protein-calorie malnutrition, severe   Stage III chronic kidney disease   AAA (abdominal aortic aneurysm)   FTT (failure to thrive) in adult             Diet: Tube feeds  DVT prophylaxis: sq lovenox   Code Status: DNI but agree with CPR and cardiac resuscitation Discussed with patient's son at length. Palliative care had seen sent and discussed goals of care during his hospitalization in December 2015. Given his ongoing hospitalization and sepsis will need to readdress goals of care this hospitalization again. We'll consult palliative care again.  Family Communication: discussed with son at bedside Disposition Plan: Mid to stepdown  [facility], [facility] Pager [phone]  Total time spent on admission :70 minutes  If 7PM-7AM, please contact night-coverage [email or web address] Password [email or web address] 04/29/2014, 1:48 PM

## 2014-04-29 NOTE — ED Notes (Signed)
Admitting MD at bedside reports pt does NOT need contact precautions for hx of C. DIFF, no diarrhea at this time.

## 2014-04-29 NOTE — ED Notes (Signed)
Dr. Mingo Amber was notified of the patients I-Stat CG4 Lactic Acid of 2.05.

## 2014-04-29 NOTE — Telephone Encounter (Signed)
Daughter called after cancelling today's appointment to check on the necessity of rescheduling. Spoke to Dr Marin Olp and he says that if patient is still on blood thinners, he should continue to be seen. Patient has not been on blood thinners for several months per the daughter. Told her that we did not need to see him at this time, but that if at some point they felt that he needed to be seen by Dr Marin Olp, to call the office back to reschedule. [name] was happy with this plan.

## 2014-04-29 NOTE — ED Provider Notes (Signed)
CSN: [id number]     Arrival date & time 04/29/14  1058 History   First MD Initiated Contact with Patient 04/29/14 1100     Chief Complaint  Patient presents with  . Fever  . Hypotension     (Consider location/radiation/quality/duration/timing/severity/associated sxs/prior Treatment) Patient is a 81 y.o. male presenting with fever. The history is provided by the patient.  Fever Max temp prior to arrival:  103 Temp source:  Rectal Severity:  Moderate Onset quality:  Sudden Timing:  Constant Progression:  Unchanged Chronicity:  New Relieved by:  Nothing Worsened by:  Nothing tried Associated symptoms: no chills, no cough and no vomiting     Past Medical History  Diagnosis Date  . Hypertension   . BPH (benign prostatic hyperplasia)   . Hyperlipidemia   . Cancer 2008-2009    prostate TREATED WITH RADIATION  . AAA (abdominal aortic aneurysm) 04/2012    STENTING OF AAA IN [place]  . Junctional cardiac arrhythmia     Occurred postoperatively after urologic surgery  . Severe sinus bradycardia     Occurred postoperatively after urologic surgery  . Aortic valve disorders   . Other primary cardiomyopathies   . Dysphagia   . Status post insertion of percutaneous endoscopic gastrostomy (PEG) tube 02/11/14  . Bacteremia due to Pseudomonas   . Infected aortic graft   . DVT (deep venous thrombosis) 04/2013    s/p IVC Filter   Past Surgical History  Procedure Laterality Date  . Abdominal surgery      PART OF COLON REMOVED FOR DIVERTICULITIS  . Appendectomy    . Hernia repair    . Bladder surgery  2008    FOR BLADDER STONE  . Total knee arthroplasty  1990    left  . Abdominal aortic aneurysm repair  04/2012  . Total knee arthroplasty Right 07/26/2012    Procedure: RIGHT TOTAL KNEE ARTHROPLASTY;  Surgeon: Tobi Bastos, MD;  Location: [facility];  Service: Orthopedics;  Laterality: Right;  . Nephrolithotomy Left 04/26/2013    Procedure: LEFT PERCUTANEOUS NEPHROLITHOTOMY ;   Surgeon: [name], MD;  Location: [facility];  Service: Urology;  Laterality: Left;  . Nephrolithotomy Left 05/03/2013    Procedure: 2ND STAGE LEFT PERCUTANEOUS NEPHROLITHOTOMY ;  Surgeon: [name], MD;  Location: [facility];  Service: Urology;  Laterality: Left;  . Esophagogastroduodenoscopy N/A 02/11/2014    NKN:[id number] hiatal hernia. Patchy erythema of the gastric mucosa; otherwise negative EGD. Status post 81 French Microvasive PEG tube placement  . Peg placement N/A 02/11/2014    Procedure: PERCUTANEOUS ENDOSCOPIC GASTROSTOMY (PEG) PLACEMENT;  Surgeon: Daneil Dolin, MD;  Location: [facility];  Service: Endoscopy;  Laterality: N/A;   Family History  Problem Relation Age of Onset  . Lung cancer Father    History  Substance Use Topics  . Smoking status: Former Smoker -- 2.00 packs/day for 37 years    Types: Cigarettes    Start date: 02/17/1949    Quit date: 03/15/1985  . Smokeless tobacco: Former Systems developer     Comment: quit 25 years ago  . Alcohol Use: No    Review of Systems  Constitutional: Negative for fever and chills.  Respiratory: Negative for cough and shortness of breath.   Gastrointestinal: Negative for vomiting and abdominal pain.  All other systems reviewed and are negative.     Allergies  Atorvastatin and Penicillins  Home Medications   Prior to Admission medications   Medication Sig Start Date End Date Taking? Authorizing  Provider  acetaminophen (TYLENOL) 500 MG tablet Take 1,000 mg by mouth every 6 (six) hours as needed for mild pain or moderate pain.    Historical Provider, MD  albuterol (PROVENTIL) (2.5 MG/3ML) 0.083% nebulizer solution Take 3 mLs (2.5 mg total) by nebulization every 2 (two) hours as needed for wheezing or shortness of breath. 02/28/14   Bobby Rumpf [place], PA-C  Amino Acids-Protein Hydrolys (FEEDING SUPPLEMENT, PRO-STAT SUGAR FREE 64,) LIQD Take 30 mLs by mouth 3 (three) times daily with meals. 03/27/14   Maryann Mikhail, DO  bisacodyl (DULCOLAX) 10  MG suppository Place 10 mg rectally as needed for mild constipation or moderate constipation.    Historical Provider, MD  ciprofloxacin (CIPRO) 750 MG tablet Take 1 tablet (750 mg total) by mouth 2 (two) times daily. 04/23/14   Geradine Girt, DO  doxazosin (CARDURA) 4 MG tablet Take 4 mg by mouth at bedtime.    Historical Provider, MD  famotidine (PEPCID) 20 MG tablet Place 1 tablet (20 mg total) into feeding tube at bedtime. 04/23/14   Geradine Girt, DO  lactulose (CHRONULAC) 10 GM/15ML solution Take 10 g by mouth daily. Every day per [facility]    Historical Provider, MD  magnesium hydroxide (MILK OF MAGNESIA) 400 MG/5ML suspension Take 30 mLs by mouth daily as needed for mild constipation.    Historical Provider, MD  magnesium oxide (MAG-OX) 400 (241.3 MG) MG tablet Take 0.5 tablets (200 mg total) by mouth daily. 04/23/14   Geradine Girt, DO  methylphenidate (RITALIN) 5 MG tablet Take 1 tablet (5 mg total) by mouth daily. 04/24/14   Monica Viviano Simas, DO  mirtazapine (REMERON) 30 MG tablet Take 30 mg by mouth at bedtime.    Historical Provider, MD  Nutritional Supplements (FEEDING SUPPLEMENT, JEVITY 1.2 CAL,) LIQD Place 1,000 mLs into feeding tube daily. 04/23/14   Geradine Girt, DO  simvastatin (ZOCOR) 20 MG tablet Take 20 mg by mouth daily.    Historical Provider, MD  sodium phosphate (FLEET) enema Place 1 enema rectally as needed (for constipation). follow package directions    Historical Provider, MD  tolterodine (DETROL LA) 4 MG 24 hr capsule Take 4 mg by mouth daily.    Historical Provider, MD  triamcinolone cream (KENALOG) 0.1 % Apply 1 application topically 2 (two) times daily as needed (rash).    Historical Provider, MD   BP 81/51 mmHg  Pulse 125  Temp(Src) 104.6 F (40.3 C) (Rectal)  Ht 6\' 2"  (9.39 m)  Wt 219 lb (99.338 kg)  BMI 28.11 kg/m2  SpO2 92% Physical Exam  Constitutional: He is oriented to person, place, and time. He appears well-developed and well-nourished. No distress.    HENT:  Head: Normocephalic and atraumatic.  Mouth/Throat: Oropharynx is clear and moist. No oropharyngeal exudate.  Eyes: EOM are normal. Pupils are equal, round, and reactive to light.  Neck: Normal range of motion. Neck supple.  Cardiovascular: Normal rate and regular rhythm.  Exam reveals no friction rub.   No murmur heard. Pulmonary/Chest: Effort normal and breath sounds normal. No respiratory distress. He has no wheezes. He has no rales.  Abdominal: Soft. He exhibits no distension. There is no tenderness. There is no rebound.    Musculoskeletal: Normal range of motion. He exhibits no edema.  Neurological: He is alert and oriented to person, place, and time. No cranial nerve deficit. He exhibits normal muscle tone. Coordination normal. GCS eye subscore is 4. GCS verbal subscore is 5. GCS motor subscore is  6.  Skin: Skin is warm. No rash noted. He is not diaphoretic.  Nursing note and vitals reviewed.   ED Course  Procedures (including critical care time) Labs Review Labs Reviewed  CULTURE, BLOOD (ROUTINE X 2)  CULTURE, BLOOD (ROUTINE X 2)  URINE CULTURE  CBC WITH DIFFERENTIAL/PLATELET  COMPREHENSIVE METABOLIC PANEL  URINALYSIS, ROUTINE W REFLEX MICROSCOPIC  I-STAT CG4 LACTIC ACID, ED    Imaging Review Dg Chest Port 1 View  04/29/2014   CLINICAL DATA:  Elevated temperature.  Sepsis.  Hypertension  EXAM: PORTABLE CHEST - 1 VIEW  COMPARISON:  04/19/2014  FINDINGS: Elevated left hemidiaphragm. Mild bilateral interstitial prominence. Hazy left lower lobe airspace disease. No pleural effusion or pneumothorax. Stable cardiomediastinal silhouette. Descending thoracic aortic stent graft. No acute osseous abnormality.  IMPRESSION: Cardiomegaly with mild CHF. Hazy left lower lobe airspace disease which may reflect atelectasis versus pneumonia.   Electronically Signed   By: Kathreen Devoid   On: 04/29/2014 11:26   Ct Angio Chest Aortic Dissect W &/or W/o  04/29/2014   CLINICAL DATA:   Thoracoabdominal aortic aneurysm post stenting, recurrent graft infection, fever, hypotension, generalized abdominal pain, sepsis  EXAM: CT ANGIOGRAPHY CHEST, ABDOMEN AND PELVIS  CTA ABDOMEN AND PELVIS WITHOUT AND WITH CONTRAST  TECHNIQUE: Multidetector CT imaging through the chest, abdomen and pelvis was performed using the standard protocol during bolus administration of intravenous contrast. Multiplanar reconstructed images and MIPs were obtained and reviewed to evaluate the vascular anatomy.  CONTRAST:  125mL OMNIPAQUE IOHEXOL 350 MG/ML SOLN IV  COMPARISON:  02/26/2014  FINDINGS: CTA CHEST FINDINGS  Extensive atherosclerotic calcifications aorta, coronary arteries and proximal great vessels.  Endograft identified beginning at proximal descending thoracic aorta extending into abdomen.  No high attenuation mural crescent identified on precontrast imaging.  Descending thoracic aorta stable in size.  Aneurysmal dilatation of the ascending thoracic aorta measuring 4.7 cm transverse immediately proximal to aortic arch, 4.5 cm on previous exam.  Minimal low-attenuation pericardial effusion.  No evidence of dissection or periaortic hemorrhage.  Scattered atelectasis greater in lower lobes and lateral RIGHT upper lobe.  Small BILATERAL pleural effusions.  Minimal patchy infiltrate in lingula.  No thoracic adenopathy or pneumothorax.  Bones demineralized.  Review of the MIP images confirms the above findings.  CTA ABDOMEN AND PELVIS FINDINGS  Aortic endograft extends from distal aspect of aortic arch through abdominal aorta and aortic bifurcation into the LEFT common iliac artery and RIGHT internal and external iliac arteries.  Aneurysmal dilatation of the proximal abdominal aorta beginning below the diaphragmatic hiatus and extending to the SMA origin, 6.9 x 6.4 cm image 121 previously 6.9 x 6.1 cm.  Second fusiform focus of aortic dilatation at distal abdominal aorta at bifurcation, 4.1 x 4.6 cm image 200, previously  4.4 x 3.9 cm.  Third area of aneurysmal dilatation located at RIGHT common iliac artery, 4.3 cm diameter image 223, previously 4.4 cm.  Several small foci of calcification are seen within the aneurysm thrombus without definite evidence of endoleak or acute perianeurysmal hemorrhage.  Infiltration of periaortic fat anterior to the proximal abdominal aortic aneurysm at the level of the celiac artery appears unchanged, question related to prior aneurysm rupture/hemorrhage, perianeurysmal fibrosis or infection.  Small pseudoaneurysm identified at the celiac stent on 01/03/2014 exam is not visualized on the current exam nor on the previous study of 02/26/2014 due to interval intervention.  No discrete fluid collection to suggest abscess is identified.  LEFT renal cyst stable.  Liver, spleen, atrophic pancreas,  kidneys, and adrenal glands otherwise unremarkable.  Gastrostomy tube present.  Bowel loops suboptimally assessed due to lack of GI contrast, no gross abnormality seen.  No additional mass, adenopathy, free fluid, or free air.  IVC filter noted.  Degenerative disc and facet disease changes thoracolumbar spine.  Review of the MIP images confirms the above findings.  IMPRESSION:  Post stenting of thoracoabdominal aorta through aortic bifurcation into the iliac systems bilaterally farther on RIGHT.  Additional stenting of the celiac, SMA and renal arteries.  Areas of aortic dilatation appear fairly stable in sizes since previous exam.  Perianeurysmal infiltration centered at the level of the celiac artery appears little changed from the previous exam favor related to prior hemorrhage and/or fibrosis, less likely infection.  No discrete abscess collection is identified though graft infection cannot be excluded by CT.  Scattered mild pulmonary atelectasis and small pleural effusions as above.   Electronically Signed   By: Lavonia Dana M.D.   On: 04/29/2014 15:25   Ct Cta Abd/pel W/cm &/or W/o Cm  04/29/2014   CLINICAL  DATA:  Thoracoabdominal aortic aneurysm post stenting, recurrent graft infection, fever, hypotension, generalized abdominal pain, sepsis  EXAM: CT ANGIOGRAPHY CHEST, ABDOMEN AND PELVIS  CTA ABDOMEN AND PELVIS WITHOUT AND WITH CONTRAST  TECHNIQUE: Multidetector CT imaging through the chest, abdomen and pelvis was performed using the standard protocol during bolus administration of intravenous contrast. Multiplanar reconstructed images and MIPs were obtained and reviewed to evaluate the vascular anatomy.  CONTRAST:  122mL OMNIPAQUE IOHEXOL 350 MG/ML SOLN IV  COMPARISON:  02/26/2014  FINDINGS: CTA CHEST FINDINGS  Extensive atherosclerotic calcifications aorta, coronary arteries and proximal great vessels.  Endograft identified beginning at proximal descending thoracic aorta extending into abdomen.  No high attenuation mural crescent identified on precontrast imaging.  Descending thoracic aorta stable in size.  Aneurysmal dilatation of the ascending thoracic aorta measuring 4.7 cm transverse immediately proximal to aortic arch, 4.5 cm on previous exam.  Minimal low-attenuation pericardial effusion.  No evidence of dissection or periaortic hemorrhage.  Scattered atelectasis greater in lower lobes and lateral RIGHT upper lobe.  Small BILATERAL pleural effusions.  Minimal patchy infiltrate in lingula.  No thoracic adenopathy or pneumothorax.  Bones demineralized.  Review of the MIP images confirms the above findings.  CTA ABDOMEN AND PELVIS FINDINGS  Aortic endograft extends from distal aspect of aortic arch through abdominal aorta and aortic bifurcation into the LEFT common iliac artery and RIGHT internal and external iliac arteries.  Aneurysmal dilatation of the proximal abdominal aorta beginning below the diaphragmatic hiatus and extending to the SMA origin, 6.9 x 6.4 cm image 121 previously 6.9 x 6.1 cm.  Second fusiform focus of aortic dilatation at distal abdominal aorta at bifurcation, 4.1 x 4.6 cm image 200,  previously 4.4 x 3.9 cm.  Third area of aneurysmal dilatation located at RIGHT common iliac artery, 4.3 cm diameter image 223, previously 4.4 cm.  Several small foci of calcification are seen within the aneurysm thrombus without definite evidence of endoleak or acute perianeurysmal hemorrhage.  Infiltration of periaortic fat anterior to the proximal abdominal aortic aneurysm at the level of the celiac artery appears unchanged, question related to prior aneurysm rupture/hemorrhage, perianeurysmal fibrosis or infection.  Small pseudoaneurysm identified at the celiac stent on 01/03/2014 exam is not visualized on the current exam nor on the previous study of 02/26/2014 due to interval intervention.  No discrete fluid collection to suggest abscess is identified.  LEFT renal cyst stable.  Liver, spleen,  atrophic pancreas, kidneys, and adrenal glands otherwise unremarkable.  Gastrostomy tube present.  Bowel loops suboptimally assessed due to lack of GI contrast, no gross abnormality seen.  No additional mass, adenopathy, free fluid, or free air.  IVC filter noted.  Degenerative disc and facet disease changes thoracolumbar spine.  Review of the MIP images confirms the above findings.  IMPRESSION:  Post stenting of thoracoabdominal aorta through aortic bifurcation into the iliac systems bilaterally farther on RIGHT.  Additional stenting of the celiac, SMA and renal arteries.  Areas of aortic dilatation appear fairly stable in sizes since previous exam.  Perianeurysmal infiltration centered at the level of the celiac artery appears little changed from the previous exam favor related to prior hemorrhage and/or fibrosis, less likely infection.  No discrete abscess collection is identified though graft infection cannot be excluded by CT.  Scattered mild pulmonary atelectasis and small pleural effusions as above.   Electronically Signed   By: Lavonia Dana M.D.   On: 04/29/2014 15:25     EKG Interpretation   Date/Time:   Monday April 29 2014 10:59:56 EST Ventricular Rate:  125 PR Interval:    QRS Duration: 111 QT Interval:  410 QTC Calculation: 591 R Axis:   19 Text Interpretation:  Age not entered, assumed to be  81 years old for  purpose of ECG interpretation Junctional tachycardia Abnrm T, probable  ischemia, anterolateral lds Prolonged QT interval Baseline wander in  lead(s) V6 Anterior T wave inverisions similar to prior from last several  EKGs Confirmed by Mingo Amber  MD, Janey Petron ([id number]) on 04/29/2014 11:12:39 AM     CRITICAL CARE Performed by: Osvaldo Shipper   Total critical care time: 30 minutes  Critical care time was exclusive of separately billable procedures and treating other patients.  Critical care was necessary to treat or prevent imminent or life-threatening deterioration.  Critical care was time spent personally by me on the following activities: development of treatment plan with patient and/or surrogate as well as nursing, discussions with consultants, evaluation of patient's response to treatment, examination of patient, obtaining history from patient or surrogate, ordering and performing treatments and interventions, ordering and review of laboratory studies, ordering and review of radiographic studies, pulse oximetry and re-evaluation of patient's condition.  MDM   Final diagnoses:  Sepsis, due to unspecified organism  Hypotension, unspecified hypotension type    31M presents with fever, tachycardia, hypotension. Patient was recently discharged after a hospitalization with Sepsis. No definitive source found, was started on high dose Cipro. Here rectal temp 104.6, hypotensive initially at 79/43, HR in the 120s. Code Sepsis initiated. Vanc and Cefepime initiated.  Admitted to stepdown for his lower blood pressure in the setting of severe sepsis.  Evelina Bucy, MD 04/29/14 [phone]

## 2014-04-29 NOTE — Progress Notes (Signed)
Pt arrived to unit accompanied by 2 ED RNs. Pt oriented to room/unit. No s/s of acute distress noted. Family at bedside.

## 2014-04-29 NOTE — ED Notes (Signed)
Attempted report 

## 2014-04-29 NOTE — ED Notes (Signed)
Pt presents via [facility] from [place]. Per report pt had temp of 104 this AM, 650 mg Tylenol given at 0830.  BP-80/50 P-129 R-18 94% RA. Per report patient was here last week with sepsis.

## 2014-04-29 NOTE — Progress Notes (Addendum)
ANTIBIOTIC CONSULT NOTE - INITIAL  Pharmacy Consult for vanc/cefepime  Indication: rule out sepsis  Allergies  Allergen Reactions  . Atorvastatin Other (See Comments)    Causes constipation  . Penicillins Other (See Comments)    Other reaction(s): RASH HIVES     Patient Measurements: Height: 6\' 2"  (188 cm) Weight: 219 lb (99.338 kg) IBW/kg (Calculated) : 82.2 Adjusted Body Weight:   Vital Signs: Temp: 104.6 F (40.3 C) (02/15 1101) Temp Source: Rectal (02/15 1101) BP: 81/51 mmHg (02/15 1101) Pulse Rate: 125 (02/15 1101) Intake/Output from previous day:   Intake/Output from this shift:    Labs: No results for input(s): WBC, HGB, PLT, LABCREA, CREATININE in the last 72 hours. Estimated Creatinine Clearance: 64 mL/min (by C-G formula based on Cr of 1.12). No results for input(s): VANCOTROUGH, VANCOPEAK, VANCORANDOM, GENTTROUGH, GENTPEAK, GENTRANDOM, TOBRATROUGH, TOBRAPEAK, TOBRARND, AMIKACINPEAK, AMIKACINTROU, AMIKACIN in the last 72 hours.   Microbiology: Recent Results (from the past 720 hour(s))  Urine culture     Status: None   Collection Time: 04/09/14  6:18 PM  Result Value Ref Range Status   Specimen Description URINE, CLEAN CATCH  Final   Special Requests NONE  Final   Colony Count NO GROWTH Performed at [facility]   Final   Culture NO GROWTH Performed at [facility]   Final   Report Status 04/11/2014 FINAL  Final  Clostridium Difficile by PCR     Status: None   Collection Time: 04/09/14  6:18 PM  Result Value Ref Range Status   C difficile by pcr NEGATIVE NEGATIVE Final  Stool culture     Status: None   Collection Time: 04/09/14  6:18 PM  Result Value Ref Range Status   Specimen Description STOOL  Final   Special Requests NONE  Final   Culture   Final    NO SALMONELLA, SHIGELLA, CAMPYLOBACTER, YERSINIA, OR E.COLI 0157:[id number] ISOLATED Note: REDUCED NORMAL FLORA PRESENT Performed at [facility]    Report Status 04/13/2014  FINAL  Final  Blood Culture (routine x 2)     Status: None   Collection Time: 04/09/14  7:18 PM  Result Value Ref Range Status   Specimen Description BLOOD ARM RIGHT  Final   Special Requests BOTTLES DRAWN AEROBIC AND ANAEROBIC 5CC  Final   Culture   Final    NO GROWTH 5 DAYS Performed at [facility]    Report Status 04/16/2014 FINAL  Final  Blood Culture (routine x 2)     Status: None   Collection Time: 04/09/14  8:23 PM  Result Value Ref Range Status   Specimen Description BLOOD ARM LEFT  Final   Special Requests BOTTLES DRAWN AEROBIC AND ANAEROBIC 5CC  Final   Culture   Final    NO GROWTH 5 DAYS Performed at [facility]    Report Status 04/16/2014 FINAL  Final  MRSA PCR Screening     Status: None   Collection Time: 04/10/14  2:05 AM  Result Value Ref Range Status   MRSA by PCR NEGATIVE NEGATIVE Final    Comment:        The GeneXpert MRSA Assay (FDA approved for NASAL specimens only), is one component of a comprehensive MRSA colonization surveillance program. It is not intended to diagnose MRSA infection nor to guide or monitor treatment for MRSA infections.   Culture, blood (routine x 2)     Status: None   Collection Time: 04/17/14  4:04 AM  Result Value  Ref Range Status   Specimen Description BLOOD LEFT ARM  Final   Special Requests BOTTLES DRAWN AEROBIC AND ANAEROBIC 4CC EACH  Final   Culture   Final    NO GROWTH 5 DAYS Performed at [facility]    Report Status 04/23/2014 FINAL  Final  Culture, blood (routine x 2)     Status: None   Collection Time: 04/17/14  4:14 AM  Result Value Ref Range Status   Specimen Description BLOOD RIGHT ARM  Final   Special Requests BOTTLES DRAWN AEROBIC AND ANAEROBIC 10CC EACH  Final   Culture   Final    NO GROWTH 5 DAYS Note: Culture results may be compromised due to an excessive volume of blood received in culture bottles. Performed at [facility]    Report Status 04/23/2014 FINAL  Final   Urine culture     Status: None   Collection Time: 04/17/14  4:39 AM  Result Value Ref Range Status   Specimen Description URINE, CATHETERIZED  Final   Special Requests Immunocompromised  Final   Colony Count NO GROWTH Performed at [facility]   Final   Culture NO GROWTH Performed at [facility]   Final   Report Status 04/18/2014 FINAL  Final  Culture, blood (x 2)     Status: None   Collection Time: 04/17/14 12:20 PM  Result Value Ref Range Status   Specimen Description BLOOD LEFT HAND  Final   Special Requests BOTTLES DRAWN AEROBIC ONLY [place]  Final   Culture   Final    NO GROWTH 5 DAYS Performed at [facility]    Report Status 04/23/2014 FINAL  Final  Culture, blood (x 2)     Status: None   Collection Time: 04/17/14 12:28 PM  Result Value Ref Range Status   Specimen Description BLOOD RIGHT ANTECUBITAL  Final   Special Requests BOTTLES DRAWN AEROBIC AND ANAEROBIC 10CC  Final   Culture   Final    NO GROWTH 5 DAYS Performed at [facility]    Report Status 04/23/2014 FINAL  Final  Culture, sputum-assessment     Status: None   Collection Time: 04/17/14  1:20 PM  Result Value Ref Range Status   Specimen Description SPUTUM  Final   Special Requests NONE  Final   Sputum evaluation   Final    MICROSCOPIC FINDINGS SUGGEST THAT THIS SPECIMEN IS NOT REPRESENTATIVE OF LOWER RESPIRATORY SECRETIONS. PLEASE RECOLLECT. REPORT CALLED TO L.SATTERFIELD,RN 04/17/14 1354 BY BSLADE    Report Status 04/17/2014 FINAL  Final  Culture, expectorated sputum-assessment     Status: None   Collection Time: 04/17/14  5:38 PM  Result Value Ref Range Status   Specimen Description SPUTUM  Final   Special Requests Immunocompromised  Final   Sputum evaluation   Final    MICROSCOPIC FINDINGS SUGGEST THAT THIS SPECIMEN IS NOT REPRESENTATIVE OF LOWER RESPIRATORY SECRETIONS. PLEASE RECOLLECT. CALLED TO R.SARINE,RN AT 2148 BY L.PITT  04/17/14    Report Status 04/17/2014  FINAL  Final  Clostridium Difficile by PCR     Status: None   Collection Time: 04/17/14  9:10 PM  Result Value Ref Range Status   C difficile by pcr NEGATIVE NEGATIVE Final    Medical History: Past Medical History  Diagnosis Date  . Hypertension   . BPH (benign prostatic hyperplasia)   . Hyperlipidemia   . Cancer 2008-2009    prostate TREATED WITH RADIATION  . AAA (abdominal aortic aneurysm) 04/2012  STENTING OF AAA IN [place]  . Junctional cardiac arrhythmia     Occurred postoperatively after urologic surgery  . Severe sinus bradycardia     Occurred postoperatively after urologic surgery  . Aortic valve disorders   . Other primary cardiomyopathies   . Dysphagia   . Status post insertion of percutaneous endoscopic gastrostomy (PEG) tube 02/11/14  . Bacteremia due to Pseudomonas   . Infected aortic graft   . DVT (deep venous thrombosis) 04/2013    s/p IVC Filter    Medications:  Scheduled:  . acetaminophen (TYLENOL) oral liquid 160 mg/5 mL  500 mg Oral Once   Infusions:  . sodium chloride    . sodium chloride    . vancomycin     Assessment: 81 yo who was recently here for various issues including a hx abd aneurysm, pseudomonal graft infection, proteus bacteremia. She was transition to PO  Cipro. He has been multiple abx in the past. Now being re-admitted again to poss sepsis. Vanc/cefepime have been ordered empirically.   Goal of Therapy:  Vancomycin trough level 15-20 mcg/ml  Plan:   Vanc 2g IV x1 Vanc 1.25g IV q24 Cefepime 2g IV q24  Onnie Boer, PharmD Pager: [phone] 04/29/2014 11:30 AM   Addn:  Pharmacy is consulted to dose aztreonam in place of cefepime for HCAP based on PCN allergy. Pt received one dose of cefepime.   Plan: Aztreonam 2g IV q8h Continue vancomycin 1250mg  IV q24h Stop cefepime F/u drug levels, cultures, and renal function  Andrey Cota. Diona Foley, PharmD Clinical Pharmacist Pager [phone]

## 2014-04-29 NOTE — ED Notes (Signed)
Family at bedside. 

## 2014-04-29 NOTE — Evaluation (Signed)
Clinical/Bedside Swallow Evaluation Patient Details  Name: [name] MRN: [id number] Date of Birth: May 22, 1929  Today's Date: 04/29/2014 Time: SLP Start Time (ACUTE ONLY): 1600 SLP Stop Time (ACUTE ONLY): 1620 SLP Time Calculation (min) (ACUTE ONLY): 20 min  Past Medical History:  Past Medical History  Diagnosis Date  . Hypertension   . BPH (benign prostatic hyperplasia)   . Hyperlipidemia   . AAA (abdominal aortic aneurysm) 04/2012    STENTING OF AAA IN [place]  . Junctional cardiac arrhythmia     Occurred postoperatively after urologic surgery  . Severe sinus bradycardia     Occurred postoperatively after urologic surgery  . Aortic valve disorders   . Other primary cardiomyopathies   . Dysphagia   . Status post insertion of percutaneous endoscopic gastrostomy (PEG) tube 02/11/14  . Bacteremia due to Pseudomonas   . Infected aortic graft   . DVT (deep venous thrombosis) 04/2013    s/p IVC Filter  . Cancer 2008-2009    prostate TREATED WITH RADIATION   Past Surgical History:  Past Surgical History  Procedure Laterality Date  . Abdominal surgery      PART OF COLON REMOVED FOR DIVERTICULITIS  . Appendectomy    . Hernia repair    . Bladder surgery  2008    FOR BLADDER STONE  . Total knee arthroplasty  1990    left  . Abdominal aortic aneurysm repair  04/2012  . Total knee arthroplasty Right 07/26/2012    Procedure: RIGHT TOTAL KNEE ARTHROPLASTY;  Surgeon: Tobi Bastos, MD;  Location: [facility];  Service: Orthopedics;  Laterality: Right;  . Nephrolithotomy Left 04/26/2013    Procedure: LEFT PERCUTANEOUS NEPHROLITHOTOMY ;  Surgeon: [name], MD;  Location: [facility];  Service: Urology;  Laterality: Left;  . Nephrolithotomy Left 05/03/2013    Procedure: 2ND STAGE LEFT PERCUTANEOUS NEPHROLITHOTOMY ;  Surgeon: [name], MD;  Location: [facility];  Service: Urology;  Laterality: Left;  . Esophagogastroduodenoscopy N/A 02/11/2014    MVH:QIONG hiatal hernia. Patchy erythema of  the gastric mucosa; otherwise negative EGD. Status post 22 French Microvasive PEG tube placement  . Peg placement N/A 02/11/2014    Procedure: PERCUTANEOUS ENDOSCOPIC GASTROSTOMY (PEG) PLACEMENT;  Surgeon: Daneil Dolin, MD;  Location: [facility];  Service: Endoscopy;  Laterality: N/A;   HPI:  81 y/o M with PMH of DVT s/p IVC filter, AAA repair with prior cultures positive for pseudomonas & proteus who was admitted 04/29/14 with reports of fever and concern for sepsis. Pt with PEG for supplemental nutrition. Followed by SLP services during admission 01/23/14 with MBS revealing functional swallow, no penetration/aspiration but highly sensitive gag response when eating. PEG was placed 02/11/14. Repeat BSE on 04/19/14 with recommendation to begin a chopped diet and thin liquids.  Pt states since 01/23/14 admission he has lost a significant amount of weight and attributed poor intake to non-fitting dentures. Pt currently has new dentures that he reports fit well.     Assessment / Plan / Recommendation Clinical Impression  Clinical assessment reveals a normal oropharyngeal swallow. MBS in November of 2015 revealed no penetration or aspiration.  Recommendation at that time was for a regular diet with thin liquids.   Previous charting indicates that the patient and family were reporting decreased intake due to poorly fitting dentures.  The patient reports that he has gotten new dentures and that they fit well.  Swallow function appears within functional limits.  Recommend begin the patient on a dysphagia 3  diet and thin liquids.   Consider altering tube feeds to encourage PO intake.  ST will f/u for therapeutic diet tolerance.      Aspiration Risk  None    Diet Recommendation Dysphagia 3 (Mechanical Soft);Thin liquid   Liquid Administration via: Cup Medication Administration: Whole meds with liquid Supervision: Patient able to self feed;Staff to assist with self feeding Postural Changes and/or  Swallow Maneuvers: Upright 30-60 min after meal;Seated upright 90 degrees    Other  Recommendations Oral Care Recommendations: Oral care Q4 per protocol   Follow Up Recommendations   (to be determined)    Frequency and Duration min 1 x/week  2 weeks   Pertinent Vitals/Pain     SLP Swallow Goals     Swallow Study Prior Functional Status       General Date of Onset: 04/29/14 HPI: 81 y/o M with PMH of DVT s/p IVC filter, AAA repair with prior cultures positive for pseudomonas & proteus who was admitted 2/3 with reports of fever and concern for sepsis. Pt with PEG for supplemental nutrition. Followed by SLP services during admission 01/23/14 with MBS revealing functional swallow, no penetration/aspiration but highly sensitive gag response when eating. PEG was placed 02/11/14. Repeat BSE on 04/19/14 with recommendation to begin a chopped diet and thin liquids.  Pt states he has lost a significant amount of weight and attributed poor intake to non-fitting dentures. Pt currently has new dentures that he reports fit well.   Type of Study: Bedside swallow evaluation Previous Swallow Assessment: MBS 11/15 Diet Prior to this Study: NPO Respiratory Status: Room air Behavior/Cognition: Alert (Pt hesistant to participate in BSE. ) Oral Cavity - Dentition: Dentures, top;Dentures, bottom Self-Feeding Abilities: Able to feed self;Needs assist Patient Positioning: Upright in bed Baseline Vocal Quality: Clear Volitional Cough: Strong Volitional Swallow: Able to elicit    Oral/Motor/Sensory Function Overall Oral Motor/Sensory Function: Appears within functional limits for tasks assessed Labial ROM: Within Functional Limits Labial Symmetry: Within Functional Limits Labial Strength: Within Functional Limits Lingual ROM: Within Functional Limits Lingual Symmetry: Within Functional Limits Lingual Strength: Within Functional Limits Facial ROM: Within Functional Limits Facial Symmetry: Within  Functional Limits   Ice Chips Ice chips: Not tested   Thin Liquid Thin Liquid: Within functional limits Presentation: Cup;Spoon;Self Fed    Nectar Thick Nectar Thick Liquid: Not tested   Honey Thick Honey Thick Liquid: Not tested   Puree Puree: Within functional limits Presentation: Spoon   Solid   GO    Solid: Within functional limits Presentation: Self Freddy Finner, [place] 04/29/2014,4:37 PM Shelly Flatten, [place], [place] [phone]

## 2014-04-30 DIAGNOSIS — I82409 Acute embolism and thrombosis of unspecified deep veins of unspecified lower extremity: Secondary | ICD-10-CM

## 2014-04-30 DIAGNOSIS — I714 Abdominal aortic aneurysm, without rupture, unspecified: Secondary | ICD-10-CM | POA: Insufficient documentation

## 2014-04-30 DIAGNOSIS — I4581 Long QT syndrome: Secondary | ICD-10-CM

## 2014-04-30 DIAGNOSIS — N390 Urinary tract infection, site not specified: Secondary | ICD-10-CM

## 2014-04-30 DIAGNOSIS — J189 Pneumonia, unspecified organism: Secondary | ICD-10-CM | POA: Insufficient documentation

## 2014-04-30 DIAGNOSIS — Z9889 Other specified postprocedural states: Secondary | ICD-10-CM

## 2014-04-30 DIAGNOSIS — E440 Moderate protein-calorie malnutrition: Secondary | ICD-10-CM | POA: Diagnosis present

## 2014-04-30 DIAGNOSIS — I5022 Chronic systolic (congestive) heart failure: Secondary | ICD-10-CM

## 2014-04-30 DIAGNOSIS — R1310 Dysphagia, unspecified: Secondary | ICD-10-CM

## 2014-04-30 DIAGNOSIS — D649 Anemia, unspecified: Secondary | ICD-10-CM

## 2014-04-30 DIAGNOSIS — E43 Unspecified severe protein-calorie malnutrition: Secondary | ICD-10-CM

## 2014-04-30 DIAGNOSIS — A419 Sepsis, unspecified organism: Secondary | ICD-10-CM | POA: Insufficient documentation

## 2014-04-30 LAB — URINE CULTURE
Colony Count: NO GROWTH
Culture: NO GROWTH

## 2014-04-30 LAB — COMPREHENSIVE METABOLIC PANEL
ALT: 11 U/L (ref 0–53)
ANION GAP: 2 — AB (ref 5–15)
AST: 19 U/L (ref 0–37)
Albumin: 2 g/dL — ABNORMAL LOW (ref 3.5–5.2)
Alkaline Phosphatase: 39 U/L (ref 39–117)
BILIRUBIN TOTAL: 0.4 mg/dL (ref 0.3–1.2)
BUN: 22 mg/dL (ref 6–23)
CALCIUM: 8.1 mg/dL — AB (ref 8.4–10.5)
CHLORIDE: 112 mmol/L (ref 96–112)
CO2: 22 mmol/L (ref 19–32)
CREATININE: 0.93 mg/dL (ref 0.50–1.35)
GFR calc Af Amer: 88 mL/min — ABNORMAL LOW (ref >90)
GFR calc non Af Amer: 76 mL/min — ABNORMAL LOW (ref >90)
Glucose, Bld: 89 mg/dL (ref 70–99)
Potassium: 3.6 mmol/L (ref 3.5–5.1)
Sodium: 136 mmol/L (ref 135–145)
Total Protein: 5 g/dL — ABNORMAL LOW (ref 6.0–8.3)

## 2014-04-30 LAB — TROPONIN I

## 2014-04-30 LAB — CBC WITH DIFFERENTIAL/PLATELET
BASOS ABS: 0 10*3/uL (ref 0.0–0.1)
BASOS PCT: 1 % (ref 0–1)
EOS ABS: 0.1 10*3/uL (ref 0.0–0.7)
EOS PCT: 2 % (ref 0–5)
HEMATOCRIT: 25.1 % — AB (ref 39.0–52.0)
Hemoglobin: 8.1 g/dL — ABNORMAL LOW (ref 13.0–17.0)
Lymphocytes Relative: 13 % (ref 12–46)
Lymphs Abs: 0.5 10*3/uL — ABNORMAL LOW (ref 0.7–4.0)
MCH: 27.4 pg (ref 26.0–34.0)
MCHC: 32.3 g/dL (ref 30.0–36.0)
MCV: 84.8 fL (ref 78.0–100.0)
Monocytes Absolute: 0.4 10*3/uL (ref 0.1–1.0)
Monocytes Relative: 9 % (ref 3–12)
Neutro Abs: 3.1 10*3/uL (ref 1.7–7.7)
Neutrophils Relative %: 75 % (ref 43–77)
PLATELETS: 88 10*3/uL — AB (ref 150–400)
RBC: 2.96 MIL/uL — ABNORMAL LOW (ref 4.22–5.81)
RDW: 17.6 % — AB (ref 11.5–15.5)
WBC: 4.1 10*3/uL (ref 4.0–10.5)

## 2014-04-30 LAB — LEGIONELLA ANTIGEN, URINE

## 2014-04-30 LAB — MAGNESIUM: MAGNESIUM: 1.8 mg/dL (ref 1.5–2.5)

## 2014-04-30 LAB — LACTIC ACID, PLASMA: Lactic Acid, Venous: 0.7 mmol/L (ref 0.5–2.0)

## 2014-04-30 MED ORDER — ENOXAPARIN SODIUM 100 MG/ML ~~LOC~~ SOLN
100.0000 mg | Freq: Two times a day (BID) | SUBCUTANEOUS | Status: DC
  Administered 2014-04-30 – 2014-05-02 (×5): 100 mg via SUBCUTANEOUS
  Filled 2014-04-30 (×6): qty 1

## 2014-04-30 MED ORDER — FUROSEMIDE 10 MG/ML IJ SOLN
40.0000 mg | Freq: Once | INTRAMUSCULAR | Status: AC
  Administered 2014-04-30: 40 mg via INTRAVENOUS
  Filled 2014-04-30: qty 4

## 2014-04-30 MED ORDER — MIRTAZAPINE 15 MG PO TABS
15.0000 mg | ORAL_TABLET | Freq: Every day | ORAL | Status: DC
  Administered 2014-04-30 – 2014-05-04 (×5): 15 mg via ORAL
  Filled 2014-04-30 (×6): qty 1

## 2014-04-30 MED ORDER — JEVITY 1.2 CAL PO LIQD
1000.0000 mL | ORAL | Status: DC
  Administered 2014-04-30: 100 mL/h
  Administered 2014-05-01: 1000 mL
  Administered 2014-05-02: 100 mL/h
  Administered 2014-05-03 – 2014-05-04 (×2): 1000 mL
  Administered 2014-05-05: 100 mL/h
  Administered 2014-05-06: 1000 mL
  Filled 2014-04-30 (×12): qty 1000

## 2014-04-30 MED ORDER — CETYLPYRIDINIUM CHLORIDE 0.05 % MT LIQD
7.0000 mL | Freq: Two times a day (BID) | OROMUCOSAL | Status: DC
  Administered 2014-04-30 – 2014-05-07 (×12): 7 mL via OROMUCOSAL

## 2014-04-30 NOTE — Consult Note (Signed)
[name]   DOB:1929/10/19   DX#:[id number]   ZJQ#:[id number]  Patient Care Team: [name], MD as PCP - General (Internal Medicine) Ephraim Hamburger, [place] as Speech Language Pathologist (Speech Pathology) Danie Binder, MD as Consulting Physician (Gastroenterology)  Subjective: [name] is an 81 year old man With a history of DVT, status post filter placement, recurrent hospitalizations over the last 3 months,last one for sepsis, admitted once again for an SNF witth fever and chills. He also had a history of nonbloody vomiting in 2-3 day history of productive cough. He denies any headaches, dizziness chest pain palpitations shortness of breath abdominal pain bowel or urinary symptoms. The patient was started on IV in. Antibiotics with vancomycin and cefepime following culture obtention. CT Levada Dy was negative for PE. Admission lab work showed a white count of 5.5, hemoglobin 10.3 platelets 114 and normal chemistry. UA was positive for leukocytes Suspicious of UTI. We were kindly informed of patient's admission    Scheduled Meds: . antiseptic oral rinse  7 mL Mouth Rinse BID  . aztreonam  2 g Intravenous Q8H  . enoxaparin (LOVENOX) injection  100 mg Subcutaneous Q12H  . feeding supplement (JEVITY 1.2 CAL)  1,000 mL Per Tube Q24H  . feeding supplement (PRO-STAT SUGAR FREE 64)  30 mL Oral TID WC  . lactulose  10 g Oral Daily  . magnesium oxide  200 mg Oral Daily  . methylphenidate  5 mg Oral Daily  . mirtazapine  15 mg Oral QHS  . simvastatin  20 mg Oral Daily  . vancomycin  1,250 mg Intravenous Q24H   Continuous Infusions:  PRN Meds:acetaminophen, albuterol, bisacodyl, magnesium hydroxide, sodium phosphate   Objective:  Filed Vitals:   04/30/14 1300  BP: 90/65  Pulse: 77  Temp:   Resp: 23      Intake/Output Summary (Last 24 hours) at 04/30/14 1535 Last data filed at 04/30/14 1300  Gross per 24 hour  Intake   2567 ml  Output   2251 ml  Net    316 ml    GENERAL:alert, no distress and comfortable SKIN: skin color, texture, turgor are normal, no rashes or significant lesions EYES: normal, conjunctiva are pink and non-injected, sclera clear OROPHARYNX:no exudate, no erythema and lips, buccal mucosa, and tongue normal  NECK: supple, thyroid normal size, non-tender, without nodularity LYMPH:  no palpable lymphadenopathy in the cervical, axillary or inguinal LUNGS: clear to auscultation and percussion with normal breathing effort HEART: regular rate & rhythm and 1/6 systolic murmurs and no lower extremity edema ABDOMEN: soft, non-tender and normal bowel sounds. PEG tube present. Musculoskeletal:no cyanosis of digits and no clubbing  PSYCH: alert & oriented x 3 with fluent speech NEURO: no focal motor/sensory deficits    CBG (last 3)  No results for input(s): GLUCAP in the last 72 hours.   Labs:   Recent Labs Lab 04/29/14 1107 04/30/14 0900  WBC 5.5 4.1  HGB 10.3* 8.1*  HCT 30.7* 25.1*  PLT 114* 88*  MCV 82.5 84.8  MCH 27.7 27.4  MCHC 33.6 32.3  RDW 17.1* 17.6*  LYMPHSABS 0.4* 0.5*  MONOABS 0.8 0.4  EOSABS 0.0 0.1  BASOSABS 0.0 0.0     Chemistries:    Recent Labs Lab 04/29/14 1107 04/30/14 0900  NA 134* 136  K 4.1 3.6  CL 103 112  CO2 25 22  GLUCOSE 121* 89  BUN 21 22  CREATININE 1.16 0.93  CALCIUM 9.1 8.1*  MG 1.7 1.8  AST 26 19  ALT 12 11  ALKPHOS 57 39  BILITOT 0.4 0.4    GFR Estimated Creatinine Clearance: 76.9 mL/min (by C-G formula based on Cr of 0.93).  Liver Function Tests:  Recent Labs Lab 04/29/14 1107 04/30/14 0900  AST 26 19  ALT 12 11  ALKPHOS 57 39  BILITOT 0.4 0.4  PROT 6.1 5.0*  ALBUMIN 2.4* 2.0*   No results for input(s): LIPASE, AMYLASE in the last 168 hours. No results for input(s): AMMONIA in the last 168 hours.  Urine Studies     Component Value Date/Time   COLORURINE YELLOW 04/29/2014 1151   APPEARANCEUR CLOUDY* 04/29/2014 1151   LABSPEC 1.022 04/29/2014 1151    PHURINE 5.0 04/29/2014 1151   GLUCOSEU NEGATIVE 04/29/2014 1151   HGBUR MODERATE* 04/29/2014 1151   BILIRUBINUR NEGATIVE 04/29/2014 1151   KETONESUR 15* 04/29/2014 1151   PROTEINUR 30* 04/29/2014 1151   UROBILINOGEN 1.0 04/29/2014 1151   NITRITE NEGATIVE 04/29/2014 1151   LEUKOCYTESUR SMALL* 04/29/2014 1151    Coagulation profile No results for input(s): INR, PROTIME in the last 168 hours.  Cardiac Enzymes:  Recent Labs Lab 04/30/14 0900 04/30/14 1401  TROPONINI <0.03 <0.03   Microbiology Recent Results (from the past 240 hour(s))  Blood Culture (routine x 2)     Status: None (Preliminary result)   Collection Time: 04/29/14 11:15 AM  Result Value Ref Range Status   Specimen Description BLOOD LEFT ANTECUBITAL  Final   Special Requests BOTTLES DRAWN AEROBIC AND ANAEROBIC 5CC  Final   Culture   Final    GRAM NEGATIVE RODS Note: Gram Stain Report Called to,Read Back By and Verified With: CAROL SCHEILLER RN 04/30/14 AT 14:00 BY [facility] Performed at [facility]    Report Status PENDING  Incomplete  Blood Culture (routine x 2)     Status: None (Preliminary result)   Collection Time: 04/29/14 11:18 AM  Result Value Ref Range Status   Specimen Description BLOOD HAND LEFT  Final   Special Requests BOTTLES DRAWN AEROBIC AND ANAEROBIC 5CC  Final   Culture   Final           BLOOD CULTURE RECEIVED NO GROWTH TO DATE CULTURE WILL BE HELD FOR 5 DAYS BEFORE ISSUING A FINAL NEGATIVE REPORT Performed at [facility]    Report Status PENDING  Incomplete       Imaging Studies:  Dg Chest Port 1 View  04/29/2014   CLINICAL DATA:  Elevated temperature.  Sepsis.  Hypertension  EXAM: PORTABLE CHEST - 1 VIEW  COMPARISON:  04/19/2014  FINDINGS: Elevated left hemidiaphragm. Mild bilateral interstitial prominence. Hazy left lower lobe airspace disease. No pleural effusion or pneumothorax. Stable cardiomediastinal silhouette. Descending thoracic aortic stent graft. No acute osseous  abnormality.  IMPRESSION: Cardiomegaly with mild CHF. Hazy left lower lobe airspace disease which may reflect atelectasis versus pneumonia.   Electronically Signed   By: Kathreen Devoid   On: 04/29/2014 11:26   Ct Angio Chest Aortic Dissect W &/or W/o  04/29/2014   CLINICAL DATA:  Thoracoabdominal aortic aneurysm post stenting, recurrent graft infection, fever, hypotension, generalized abdominal pain, sepsis  EXAM: CT ANGIOGRAPHY CHEST, ABDOMEN AND PELVIS  CTA ABDOMEN AND PELVIS WITHOUT AND WITH CONTRAST  TECHNIQUE: Multidetector CT imaging through the chest, abdomen and pelvis was performed using the standard protocol during bolus administration of intravenous contrast. Multiplanar reconstructed images and MIPs were obtained and reviewed to evaluate the vascular anatomy.  CONTRAST:  128mL OMNIPAQUE IOHEXOL 350 MG/ML SOLN IV  COMPARISON:  02/26/2014  FINDINGS: CTA CHEST FINDINGS  Extensive atherosclerotic calcifications aorta, coronary arteries and proximal great vessels.  Endograft identified beginning at proximal descending thoracic aorta extending into abdomen.  No high attenuation mural crescent identified on precontrast imaging.  Descending thoracic aorta stable in size.  Aneurysmal dilatation of the ascending thoracic aorta measuring 4.7 cm transverse immediately proximal to aortic arch, 4.5 cm on previous exam.  Minimal low-attenuation pericardial effusion.  No evidence of dissection or periaortic hemorrhage.  Scattered atelectasis greater in lower lobes and lateral RIGHT upper lobe.  Small BILATERAL pleural effusions.  Minimal patchy infiltrate in lingula.  No thoracic adenopathy or pneumothorax.  Bones demineralized.  Review of the MIP images confirms the above findings.  CTA ABDOMEN AND PELVIS FINDINGS  Aortic endograft extends from distal aspect of aortic arch through abdominal aorta and aortic bifurcation into the LEFT common iliac artery and RIGHT internal and external iliac arteries.  Aneurysmal  dilatation of the proximal abdominal aorta beginning below the diaphragmatic hiatus and extending to the SMA origin, 6.9 x 6.4 cm image 121 previously 6.9 x 6.1 cm.  Second fusiform focus of aortic dilatation at distal abdominal aorta at bifurcation, 4.1 x 4.6 cm image 200, previously 4.4 x 3.9 cm.  Third area of aneurysmal dilatation located at RIGHT common iliac artery, 4.3 cm diameter image 223, previously 4.4 cm.  Several small foci of calcification are seen within the aneurysm thrombus without definite evidence of endoleak or acute perianeurysmal hemorrhage.  Infiltration of periaortic fat anterior to the proximal abdominal aortic aneurysm at the level of the celiac artery appears unchanged, question related to prior aneurysm rupture/hemorrhage, perianeurysmal fibrosis or infection.  Small pseudoaneurysm identified at the celiac stent on 01/03/2014 exam is not visualized on the current exam nor on the previous study of 02/26/2014 due to interval intervention.  No discrete fluid collection to suggest abscess is identified.  LEFT renal cyst stable.  Liver, spleen, atrophic pancreas, kidneys, and adrenal glands otherwise unremarkable.  Gastrostomy tube present.  Bowel loops suboptimally assessed due to lack of GI contrast, no gross abnormality seen.  No additional mass, adenopathy, free fluid, or free air.  IVC filter noted.  Degenerative disc and facet disease changes thoracolumbar spine.  Review of the MIP images confirms the above findings.  IMPRESSION:  Post stenting of thoracoabdominal aorta through aortic bifurcation into the iliac systems bilaterally farther on RIGHT.  Additional stenting of the celiac, SMA and renal arteries.  Areas of aortic dilatation appear fairly stable in sizes since previous exam.  Perianeurysmal infiltration centered at the level of the celiac artery appears little changed from the previous exam favor related to prior hemorrhage and/or fibrosis, less likely infection.  No discrete  abscess collection is identified though graft infection cannot be excluded by CT.  Scattered mild pulmonary atelectasis and small pleural effusions as above.   Electronically Signed   By: Lavonia Dana M.D.   On: 04/29/2014 15:25   Ct Cta Abd/pel W/cm &/or W/o Cm  04/29/2014   CLINICAL DATA:  Thoracoabdominal aortic aneurysm post stenting, recurrent graft infection, fever, hypotension, generalized abdominal pain, sepsis  EXAM: CT ANGIOGRAPHY CHEST, ABDOMEN AND PELVIS  CTA ABDOMEN AND PELVIS WITHOUT AND WITH CONTRAST  TECHNIQUE: Multidetector CT imaging through the chest, abdomen and pelvis was performed using the standard protocol during bolus administration of intravenous contrast. Multiplanar reconstructed images and MIPs were obtained and reviewed to evaluate the vascular anatomy.  CONTRAST:  141mL OMNIPAQUE IOHEXOL 350 MG/ML SOLN  IV  COMPARISON:  02/26/2014  FINDINGS: CTA CHEST FINDINGS  Extensive atherosclerotic calcifications aorta, coronary arteries and proximal great vessels.  Endograft identified beginning at proximal descending thoracic aorta extending into abdomen.  No high attenuation mural crescent identified on precontrast imaging.  Descending thoracic aorta stable in size.  Aneurysmal dilatation of the ascending thoracic aorta measuring 4.7 cm transverse immediately proximal to aortic arch, 4.5 cm on previous exam.  Minimal low-attenuation pericardial effusion.  No evidence of dissection or periaortic hemorrhage.  Scattered atelectasis greater in lower lobes and lateral RIGHT upper lobe.  Small BILATERAL pleural effusions.  Minimal patchy infiltrate in lingula.  No thoracic adenopathy or pneumothorax.  Bones demineralized.  Review of the MIP images confirms the above findings.  CTA ABDOMEN AND PELVIS FINDINGS  Aortic endograft extends from distal aspect of aortic arch through abdominal aorta and aortic bifurcation into the LEFT common iliac artery and RIGHT internal and external iliac arteries.   Aneurysmal dilatation of the proximal abdominal aorta beginning below the diaphragmatic hiatus and extending to the SMA origin, 6.9 x 6.4 cm image 121 previously 6.9 x 6.1 cm.  Second fusiform focus of aortic dilatation at distal abdominal aorta at bifurcation, 4.1 x 4.6 cm image 200, previously 4.4 x 3.9 cm.  Third area of aneurysmal dilatation located at RIGHT common iliac artery, 4.3 cm diameter image 223, previously 4.4 cm.  Several small foci of calcification are seen within the aneurysm thrombus without definite evidence of endoleak or acute perianeurysmal hemorrhage.  Infiltration of periaortic fat anterior to the proximal abdominal aortic aneurysm at the level of the celiac artery appears unchanged, question related to prior aneurysm rupture/hemorrhage, perianeurysmal fibrosis or infection.  Small pseudoaneurysm identified at the celiac stent on 01/03/2014 exam is not visualized on the current exam nor on the previous study of 02/26/2014 due to interval intervention.  No discrete fluid collection to suggest abscess is identified.  LEFT renal cyst stable.  Liver, spleen, atrophic pancreas, kidneys, and adrenal glands otherwise unremarkable.  Gastrostomy tube present.  Bowel loops suboptimally assessed due to lack of GI contrast, no gross abnormality seen.  No additional mass, adenopathy, free fluid, or free air.  IVC filter noted.  Degenerative disc and facet disease changes thoracolumbar spine.  Review of the MIP images confirms the above findings.  IMPRESSION:  Post stenting of thoracoabdominal aorta through aortic bifurcation into the iliac systems bilaterally farther on RIGHT.  Additional stenting of the celiac, SMA and renal arteries.  Areas of aortic dilatation appear fairly stable in sizes since previous exam.  Perianeurysmal infiltration centered at the level of the celiac artery appears little changed from the previous exam favor related to prior hemorrhage and/or fibrosis, less likely infection.  No  discrete abscess collection is identified though graft infection cannot be excluded by CT.  Scattered mild pulmonary atelectasis and small pleural effusions as above.   Electronically Signed   By: Lavonia Dana M.D.   On: 04/29/2014 15:25    Assessment/Plan: 81 y.o.  Sepsis HCAP/ possible UTI This is likely secondary to [facility], although urine has positive leukocytes sterease Cultures pending The patient was placed on vancomycin and aztreonam per admitting team. ID is following as well  History of DVT left lower extremity  s/p - IVC filter placed 05/06/2013.  He was on Xarelto, Unclear when this was discontinued He is currently on Lovenox, his platelet counts are now 88,000. Lovenox can be placed on hold if platelet count is less than 50,000, or the  patient is acutely bleeding Can transfuse platelets if they are less than 10,000,Were 20,000 if the patient is bleeding Dr. Niel Hummer is out of office today, Dr. Burr Medico to see patient   Anemia Due to recent Sepsis, antibiotics, Dilution Monitor counts closely Transfuse blood to maintain a Hb of 8 g or if the patient is acutely bleeding    Other medical issues Including congestive heart failure, pneumonia, Prolonged QTC, malnutrition, AAA,  as per admitting team     **Disclaimer: This note was dictated with voice recognition software. Similar sounding words can inadvertently be transcribed and this note may contain transcription errors which may not have been corrected upon publication of note.Sharene Butters E, PA-C 04/30/2014  3:35 PM  Attending addendum:  I have seen the patient, examined him. I agree with the assessment and and plan and have edited the notes.   Pt has been following my colleague Dr. Marin Olp for left leg DVT, on Xarelto. He has had multiple admission in the past 3 months. He was admitted this time for sepsis and HCAP.  His anemia and thrombocytopenia is likely related to his recurrent infections. Low fibrinogen, possible  DIC. I recommend the following labs: -PT/INR, APTT, d-dimer, follow-up fibrinogen daily. -Reticular count, LDH, haptoglobin, rule out hemolysis -ferritin and iron study -SPEP/UPEP with immunofixation -transfusion if Hb below 8  I agree with continuing anticoagulation, given his history of DVT and immobilization. He is at risk for leeding due to the thrombocytopenia and DIC.  Will hold lovenox if plt drop below 50K.  Dr. Marin Olp will follow up tomorrow.   Truitt Merle  04/30/2014

## 2014-04-30 NOTE — Progress Notes (Signed)
INITIAL NUTRITION ASSESSMENT  DOCUMENTATION CODES Per approved criteria  -Non-severe (moderate) malnutrition in the context of chronic illness   Pt meets criteria for moderate MALNUTRITION in the context of chronic illness as evidenced by mild depletion of muscle mass and 15% weight loss within 6 months.  INTERVENTION:  Resume nocturnal TF regimen at 6PM today, infuse Jevity 1.2 at 100 ml/h x 12 hours, from 6PM to 6AM to provide 1440 kcals (70% of estimated needs), 67 gm protein (65% of estimated needs),972 ml free water daily.  Diet advancement as able to dysphagia 3 with thin liquids per SLP recommendations.  NUTRITION DIAGNOSIS: Inadequate oral intake related to dysphagia as evidenced by poor intake of meals.   Goal: Intake to meet >90% of estimated nutrition needs.  Monitor:  PO intake, TF tolerance/adequacy, weight trend, labs.  Reason for Assessment: MD Consult for TF initiation and management.  81 y.o. male  Admitting Dx: Sepsis  ASSESSMENT: Patient admitted from SNF for fever. He has a history of dysphagia and has a PEG. Patient familiar from previous admission. Received nocturnal TF via PEG during last admission. He was not eating last admission because he did not have any dentures. He recently got new dentures.   S/P bedside swallow eval with SLP on 2/15, rec Dysphagia 3 diet with thin liquids. Patient reports that he got new dentures but has had nothing to eat today. Diet order was cancelled just in case Cardiology needed to perform a procedure today. Plans for a TEE on Thursday to evaluate his aortic valve stenosis and to rule out infection of the valve. Patient is glad to be able to eat again with his new dentures. Suspect PO intake will be suboptimal. Received MD Consult for TF initiation and management. Will resume nocturnal TF regimen to allow him to eat and be disconnected from the tube during the day.  Nutrition Focused Physical Exam:  Subcutaneous Fat:  Orbital  Region: mild depletion Upper Arm Region: mild depletion Thoracic and Lumbar Region: NA  Muscle:  Temple Region: mild depletion Clavicle Bone Region: mild depletion Clavicle and Acromion Bone Region: WNL Scapular Bone Region: NA Dorsal Hand: WNL Patellar Region: WNL Anterior Thigh Region: WNL Posterior Calf Region: WNL  Edema: none   Height: Ht Readings from Last 1 Encounters:  04/29/14 6\' 2"  (1.88 m)    Weight: Wt Readings from Last 1 Encounters:  04/30/14 217 lb 13 oz (98.8 kg)    Ideal Body Weight: 86.4 kg  % Ideal Body Weight: 114%  Wt Readings from Last 10 Encounters:  04/30/14 217 lb 13 oz (98.8 kg)  04/23/14 219 lb 3.2 oz (99.428 kg)  04/14/14 210 lb 1.6 oz (95.3 kg)  03/26/14 223 lb 7 oz (101.35 kg)  03/01/14 216 lb 7.9 oz (98.2 kg)  02/13/14 219 lb 12.8 oz (99.7 kg)  01/11/14 240 lb (108.863 kg)  01/02/14 240 lb (108.863 kg)  10/31/13 256 lb (116.121 kg)  08/03/13 280 lb 12.8 oz (127.37 kg)    Usual Body Weight: 219 lbs (2 months ago), 256 lbs (6 months ago)  % Usual Body Weight: 85-99%  BMI:  Body mass index is 27.95 kg/(m^2).  Estimated Nutritional Needs: Kcal: 2000-2200 Protein: 100-120 gm Fluid: 2-2.2 L  Skin: WDL  Diet Order:  Dysphagia 3 with thin liquids on hold for procedure (rescheduled for Thursday); RN hopeful that patient can have lunch and dinner today.  EDUCATION NEEDS: -Education not appropriate at this time   Intake/Output Summary (Last 24 hours) at  04/30/14 1308 Last data filed at 04/30/14 1100  Gross per 24 hour  Intake   1795 ml  Output   1651 ml  Net    144 ml    Last BM: 2/15   Labs:   Recent Labs Lab 04/29/14 1107 04/30/14 0900  NA 134* 136  K 4.1 3.6  CL 103 112  CO2 25 22  BUN 21 22  CREATININE 1.16 0.93  CALCIUM 9.1 8.1*  MG 1.7 1.8  GLUCOSE 121* 89    CBG (last 3)  No results for input(s): GLUCAP in the last 72 hours.  Scheduled Meds: . antiseptic oral rinse  7 mL Mouth Rinse BID  .  aztreonam  2 g Intravenous Q8H  . enoxaparin (LOVENOX) injection  100 mg Subcutaneous Q12H  . feeding supplement (PRO-STAT SUGAR FREE 64)  30 mL Oral TID WC  . lactulose  10 g Oral Daily  . magnesium oxide  200 mg Oral Daily  . methylphenidate  5 mg Oral Daily  . mirtazapine  15 mg Oral QHS  . simvastatin  20 mg Oral Daily  . vancomycin  1,250 mg Intravenous Q24H    Continuous Infusions: . sodium chloride 10 mL/hr at 04/30/14 1051    Past Medical History  Diagnosis Date  . Hypertension   . BPH (benign prostatic hyperplasia)   . Hyperlipidemia   . AAA (abdominal aortic aneurysm) 04/2012    STENTING OF AAA IN [place]  . Junctional cardiac arrhythmia     Occurred postoperatively after urologic surgery  . Severe sinus bradycardia     Occurred postoperatively after urologic surgery  . Aortic valve disorders   . Other primary cardiomyopathies   . Dysphagia   . Status post insertion of percutaneous endoscopic gastrostomy (PEG) tube 02/11/14  . Bacteremia due to Pseudomonas   . Infected aortic graft   . DVT (deep venous thrombosis) 04/2013    s/p IVC Filter  . Cancer 2008-2009    prostate TREATED WITH RADIATION    Past Surgical History  Procedure Laterality Date  . Abdominal surgery      PART OF COLON REMOVED FOR DIVERTICULITIS  . Appendectomy    . Hernia repair    . Bladder surgery  2008    FOR BLADDER STONE  . Total knee arthroplasty  1990    left  . Abdominal aortic aneurysm repair  04/2012  . Total knee arthroplasty Right 07/26/2012    Procedure: RIGHT TOTAL KNEE ARTHROPLASTY;  Surgeon: Tobi Bastos, MD;  Location: [facility];  Service: Orthopedics;  Laterality: Right;  . Nephrolithotomy Left 04/26/2013    Procedure: LEFT PERCUTANEOUS NEPHROLITHOTOMY ;  Surgeon: [name], MD;  Location: [facility];  Service: Urology;  Laterality: Left;  . Nephrolithotomy Left 05/03/2013    Procedure: 2ND STAGE LEFT PERCUTANEOUS NEPHROLITHOTOMY ;  Surgeon: [name], MD;  Location: [facility]  ORS;  Service: Urology;  Laterality: Left;  . Esophagogastroduodenoscopy N/A 02/11/2014    OEV:OJJKK hiatal hernia. Patchy erythema of the gastric mucosa; otherwise negative EGD. Status post 52 French Microvasive PEG tube placement  . Peg placement N/A 02/11/2014    Procedure: PERCUTANEOUS ENDOSCOPIC GASTROSTOMY (PEG) PLACEMENT;  Surgeon: Daneil Dolin, MD;  Location: [facility];  Service: Endoscopy;  Laterality: N/A;    Molli Barrows, RD, LDN, [place] Pager [phone] After Hours Pager [phone]

## 2014-04-30 NOTE — Clinical Social Work Note (Signed)
CSW advised by unit staff that patient was admitted from [facility] Full assessment and FL2 to follow-  Eduard Clos, MSW, [place]

## 2014-04-30 NOTE — Progress Notes (Signed)
Pt HR 34, notified Dr. Mare Ferrari of episode of bradycardia as md at pt's bedside.

## 2014-04-30 NOTE — Progress Notes (Signed)
Utilization review completed.  

## 2014-04-30 NOTE — Consult Note (Addendum)
CARDIOLOGY CONSULT NOTE   Patient ID: [name] MRN: [id number], DOB/AGE: 1929-11-12   Admit date: 04/29/2014 Date of Consult: 04/30/2014   Primary Physician: [name], MD Primary Cardiologist: None  Pt. Profile  81 year old gentleman admitted with recurrent sepsis.  He has had 6 hospitalizations in the last 3 months mostly related to pneumonia and/or sepsis.  He has a past history of  Pseudomonas bacteremia.  Problem List  Past Medical History  Diagnosis Date  . Hypertension   . BPH (benign prostatic hyperplasia)   . Hyperlipidemia   . AAA (abdominal aortic aneurysm) 04/2012    STENTING OF AAA IN [place]  . Junctional cardiac arrhythmia     Occurred postoperatively after urologic surgery  . Severe sinus bradycardia     Occurred postoperatively after urologic surgery  . Aortic valve disorders   . Other primary cardiomyopathies   . Dysphagia   . Status post insertion of percutaneous endoscopic gastrostomy (PEG) tube 02/11/14  . Bacteremia due to Pseudomonas   . Infected aortic graft   . DVT (deep venous thrombosis) 04/2013    s/p IVC Filter  . Cancer 2008-2009    prostate TREATED WITH RADIATION    Past Surgical History  Procedure Laterality Date  . Abdominal surgery      PART OF COLON REMOVED FOR DIVERTICULITIS  . Appendectomy    . Hernia repair    . Bladder surgery  2008    FOR BLADDER STONE  . Total knee arthroplasty  1990    left  . Abdominal aortic aneurysm repair  04/2012  . Total knee arthroplasty Right 07/26/2012    Procedure: RIGHT TOTAL KNEE ARTHROPLASTY;  Surgeon: Tobi Bastos, MD;  Location: [facility];  Service: Orthopedics;  Laterality: Right;  . Nephrolithotomy Left 04/26/2013    Procedure: LEFT PERCUTANEOUS NEPHROLITHOTOMY ;  Surgeon: [name], MD;  Location: [facility];  Service: Urology;  Laterality: Left;  . Nephrolithotomy Left 05/03/2013    Procedure: 2ND STAGE LEFT PERCUTANEOUS NEPHROLITHOTOMY ;  Surgeon: [name], MD;  Location:  [facility];  Service: Urology;  Laterality: Left;  . Esophagogastroduodenoscopy N/A 02/11/2014    [facility]:CWCBJ hiatal hernia. Patchy erythema of the gastric mucosa; otherwise negative EGD. Status post 51 French Microvasive PEG tube placement  . Peg placement N/A 02/11/2014    Procedure: PERCUTANEOUS ENDOSCOPIC GASTROSTOMY (PEG) PLACEMENT;  Surgeon: Daneil Dolin, MD;  Location: [facility];  Service: Endoscopy;  Laterality: N/A;     Allergies  Allergies  Allergen Reactions  . Atorvastatin Other (See Comments)    Causes constipation  . Penicillins Other (See Comments)    Other reaction(s): RASH HIVES     HPI   This 81 year old gentleman is status post repair of abdominal aortic aneurysm.  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  He has been treated with prolonged antibiotics.  He has a history of Pseudomonas bacteremia.  He was most recently treated with outpatient high-dose oral ciprofloxacin 750 mg twice a day.  This has subsequently been stopped because of prolonged QTc interval. Yesterday the patient's son went to visit him at the nursing home and found him having chills and fever.  Patient had a productive cough for the past 2-3 days.  In the emergency room he was found to be septic with temperature of 104.6 and blood pressure of 81/51 and his lactic acid was elevated to 2.5.  White count was 5500. The patient has a past history of aortic  stenosis.  His most recent transthoracic echo of 04/10/14 Showed reduced LV function with estimated ejection fraction of 35-40% and diffuse hypokinesis in the aortic valve appeared to be at least moderately stenotic.  The mean aortic valve gradient and peak aortic valve velocity or in the mild aortic stenosis range but these may be underestimated because of his reduced left ventricular function.  There was mild mitral regurgitation and there was mild to moderate dilatation of the left atrium. The patient does not have any  history of known coronary disease.  He denies chest pain or shortness of breath. The patient has a past history of DVT of his lower extremities in February 2015 and is post IVC filter. The patient has a prior history of aspiration pneumonia.  He has a PEG tube in place which apparently was being used at the nursing home.  He has been evaluated by speech pathology here and is now on a dysphagia 3 diet.  He denies any difficulty swallowing.   Inpatient Medications  . antiseptic oral rinse  7 mL Mouth Rinse BID  . aztreonam  2 g Intravenous Q8H  . feeding supplement (PRO-STAT SUGAR FREE 64)  30 mL Oral TID WC  . lactulose  10 g Oral Daily  . magnesium oxide  200 mg Oral Daily  . methylphenidate  5 mg Oral Daily  . mirtazapine  15 mg Oral QHS  . simvastatin  20 mg Oral Daily  . vancomycin  1,250 mg Intravenous Q24H    Family History Family History  Problem Relation Age of Onset  . Lung cancer Father      Social History History   Social History  . Marital Status: Married    Spouse Name: N/A  . Number of Children: N/A  . Years of Education: N/A   Occupational History  . Not on file.   Social History Main Topics  . Smoking status: Former Smoker -- 2.00 packs/day for 37 years    Types: Cigarettes    Start date: 02/17/1949    Quit date: 03/15/1985  . Smokeless tobacco: Former Systems developer     Comment: quit 25 years ago  . Alcohol Use: No  . Drug Use: No  . Sexual Activity: No   Other Topics Concern  . Not on file   Social History Narrative     Review of Systems  General:  Recent chills and fever and symptoms of sepsis Cardiovascular:  No chest pain, dyspnea on exertion, edema, orthopnea, palpitations, paroxysmal nocturnal dyspnea.  Long history of heart murmur Dermatological: No rash, lesions/masses Respiratory: No cough, dyspnea Urologic: No hematuria, dysuria Abdominal:  Had vomiting yesterday prior to transfer to Hospital Neurologic:  No visual changes, wkns, changes in  mental status. All other systems reviewed and are otherwise negative except as noted above.  Physical Exam  Blood pressure 110/56, pulse 78, temperature 98 F (36.7 C), temperature source Oral, resp. rate 19, height 6\' 2"  (1.88 m), weight 217 lb 13 oz (98.8 kg), SpO2 98 %.  General: Pleasant, NAD.  Hard of hearing.  Alert. Psych: Normal affect. Neuro: Alert and oriented X 3. Moves all extremities spontaneously. HEENT: Normal  Neck: Soft bruit heard in the carotids transmitted from the aortic valve Lungs:  Resp regular and unlabored, CTA.  No respiratory distress, lying flat, not requiring supplemental oxygen. Heart: RRR no s3, s4.  Grade 3/6 harsh systolic ejection murmur at the base consistent with aortic stenosis.  Also heard at apex.  No diastolic murmur heard.  Abdomen: Soft, non-tender, non-distended, BS + x 4.  Extremities: No clubbing, cyanosis.  Trace edema  Labs   Recent Labs  04/30/14 0900  TROPONINI <0.03   Lab Results  Component Value Date   WBC 4.1 04/30/2014   HGB 8.1* 04/30/2014   HCT 25.1* 04/30/2014   MCV 84.8 04/30/2014   PLT 88* 04/30/2014     Recent Labs Lab 04/30/14 0900  NA 136  K 3.6  CL 112  CO2 22  BUN 22  CREATININE 0.93  CALCIUM 8.1*  PROT 5.0*  BILITOT 0.4  ALKPHOS 39  ALT 11  AST 19  GLUCOSE 89   No results found for: CHOL, HDL, LDLCALC, TRIG Lab Results  Component Value Date   DDIMER 1.66* 08/31/2013    Radiology/Studies  Dg Chest Port 1 View  04/29/2014   CLINICAL DATA:  Elevated temperature.  Sepsis.  Hypertension  EXAM: PORTABLE CHEST - 1 VIEW  COMPARISON:  04/19/2014  FINDINGS: Elevated left hemidiaphragm. Mild bilateral interstitial prominence. Hazy left lower lobe airspace disease. No pleural effusion or pneumothorax. Stable cardiomediastinal silhouette. Descending thoracic aortic stent graft. No acute osseous abnormality.  IMPRESSION: Cardiomegaly with mild CHF. Hazy left lower lobe airspace disease which may reflect  atelectasis versus pneumonia.   Electronically Signed   By: Kathreen Devoid   On: 04/29/2014 11:26   Dg Chest Port 1 View  04/19/2014   CLINICAL DATA:  Pulmonary infiltrates.  EXAM: PORTABLE CHEST - 1 VIEW  COMPARISON:  04/18/2014.  FINDINGS: Persistent severe cardiomegaly with bilateral pulmonary infiltrates and right pleural effusion consistent with congestive heart failure appear underlying basilar pneumonia cannot be excluded. Aortic stent graft in stable position. No pneumothorax.  IMPRESSION: 1. Persistent severe cardiomegaly bilateral pulmonary alveolar infiltrates and small right pleural effusion consistent with congestive heart failure bibasilar pneumonia cannot be excluded. 2. Aortic stent graft in stable position.   Electronically Signed   By: Marcello Moores  Register   On: 04/19/2014 07:26   Dg Chest Port 1 View  04/18/2014   CLINICAL DATA:  Bilateral airspace disease, history of thoracic aortic aneurysm treated with stent graft  EXAM: PORTABLE CHEST - 1 VIEW  COMPARISON:  Portable chest x-ray of April 17, 2014  FINDINGS: The lungs are better inflated today. The interstitial markings remain increased. The pulmonary vascularity remains engorged. The cardiopericardial silhouette remains enlarged. There are small bilateral pleural effusions layering posteriorly. The bony thorax is unremarkable.  IMPRESSION: Persistent findings of congestive heart failure with pulmonary interstitial edema and small bilateral pleural effusions. Bibasilar subsegmental atelectasis or pneumonia is unchanged.   Electronically Signed   By: David  Martinique   On: 04/18/2014 07:31   Dg Chest Port 1 View  04/17/2014   CLINICAL DATA:  Fever.  EXAM: PORTABLE CHEST - 1 VIEW  COMPARISON:  04/09/2014  FINDINGS: Cardiomediastinal contours with aortic stent graft in place, unchanged from prior exam. Mildly improved right basilar opacity with residual atelectasis. Unchanged atelectasis at the left lung base. No large pleural effusion or  pneumothorax. Osseous structures are stable.  IMPRESSION: Improved right basilar aeration. No new consolidation to suggest progressive or new pneumonia.   Electronically Signed   By: Jeb Levering M.D.   On: 04/17/2014 04:15   Dg Chest Port 1 View  04/09/2014   CLINICAL DATA:  Altered mental status  EXAM: PORTABLE CHEST - 1 VIEW  COMPARISON:  Portable exam 1801 hr compared to 03/24/2014  FINDINGS: Atherosclerotic calcification aorta with prior aortic stenting.  Stable heart size and  mediastinal contours.  Increased LEFT basilar atelectasis versus infiltrate.  Mild persistent opacity in RIGHT lower lobe little changed.  Upper lungs clear.  No gross pleural effusion or pneumothorax.  IMPRESSION: Persistent RIGHT basilar opacity with increased atelectasis versus infiltrate in LEFT lower lobe.   Electronically Signed   By: Lavonia Dana M.D.   On: 04/09/2014 18:16   Ct Angio Chest Aortic Dissect W &/or W/o  04/29/2014   CLINICAL DATA:  Thoracoabdominal aortic aneurysm post stenting, recurrent graft infection, fever, hypotension, generalized abdominal pain, sepsis  EXAM: CT ANGIOGRAPHY CHEST, ABDOMEN AND PELVIS  CTA ABDOMEN AND PELVIS WITHOUT AND WITH CONTRAST  TECHNIQUE: Multidetector CT imaging through the chest, abdomen and pelvis was performed using the standard protocol during bolus administration of intravenous contrast. Multiplanar reconstructed images and MIPs were obtained and reviewed to evaluate the vascular anatomy.  CONTRAST:  116mL OMNIPAQUE IOHEXOL 350 MG/ML SOLN IV  COMPARISON:  02/26/2014  FINDINGS: CTA CHEST FINDINGS  Extensive atherosclerotic calcifications aorta, coronary arteries and proximal great vessels.  Endograft identified beginning at proximal descending thoracic aorta extending into abdomen.  No high attenuation mural crescent identified on precontrast imaging.  Descending thoracic aorta stable in size.  Aneurysmal dilatation of the ascending thoracic aorta measuring 4.7 cm transverse  immediately proximal to aortic arch, 4.5 cm on previous exam.  Minimal low-attenuation pericardial effusion.  No evidence of dissection or periaortic hemorrhage.  Scattered atelectasis greater in lower lobes and lateral RIGHT upper lobe.  Small BILATERAL pleural effusions.  Minimal patchy infiltrate in lingula.  No thoracic adenopathy or pneumothorax.  Bones demineralized.  Review of the MIP images confirms the above findings.  CTA ABDOMEN AND PELVIS FINDINGS  Aortic endograft extends from distal aspect of aortic arch through abdominal aorta and aortic bifurcation into the LEFT common iliac artery and RIGHT internal and external iliac arteries.  Aneurysmal dilatation of the proximal abdominal aorta beginning below the diaphragmatic hiatus and extending to the SMA origin, 6.9 x 6.4 cm image 121 previously 6.9 x 6.1 cm.  Second fusiform focus of aortic dilatation at distal abdominal aorta at bifurcation, 4.1 x 4.6 cm image 200, previously 4.4 x 3.9 cm.  Third area of aneurysmal dilatation located at RIGHT common iliac artery, 4.3 cm diameter image 223, previously 4.4 cm.  Several small foci of calcification are seen within the aneurysm thrombus without definite evidence of endoleak or acute perianeurysmal hemorrhage.  Infiltration of periaortic fat anterior to the proximal abdominal aortic aneurysm at the level of the celiac artery appears unchanged, question related to prior aneurysm rupture/hemorrhage, perianeurysmal fibrosis or infection.  Small pseudoaneurysm identified at the celiac stent on 01/03/2014 exam is not visualized on the current exam nor on the previous study of 02/26/2014 due to interval intervention.  No discrete fluid collection to suggest abscess is identified.  LEFT renal cyst stable.  Liver, spleen, atrophic pancreas, kidneys, and adrenal glands otherwise unremarkable.  Gastrostomy tube present.  Bowel loops suboptimally assessed due to lack of GI contrast, no gross abnormality seen.  No  additional mass, adenopathy, free fluid, or free air.  IVC filter noted.  Degenerative disc and facet disease changes thoracolumbar spine.  Review of the MIP images confirms the above findings.  IMPRESSION:  Post stenting of thoracoabdominal aorta through aortic bifurcation into the iliac systems bilaterally farther on RIGHT.  Additional stenting of the celiac, SMA and renal arteries.  Areas of aortic dilatation appear fairly stable in sizes since previous exam.  Perianeurysmal infiltration centered at the  level of the celiac artery appears little changed from the previous exam favor related to prior hemorrhage and/or fibrosis, less likely infection.  No discrete abscess collection is identified though graft infection cannot be excluded by CT.  Scattered mild pulmonary atelectasis and small pleural effusions as above.   Electronically Signed   By: Lavonia Dana M.D.   On: 04/29/2014 15:25   Ct Cta Abd/pel W/cm &/or W/o Cm  04/29/2014   CLINICAL DATA:  Thoracoabdominal aortic aneurysm post stenting, recurrent graft infection, fever, hypotension, generalized abdominal pain, sepsis  EXAM: CT ANGIOGRAPHY CHEST, ABDOMEN AND PELVIS  CTA ABDOMEN AND PELVIS WITHOUT AND WITH CONTRAST  TECHNIQUE: Multidetector CT imaging through the chest, abdomen and pelvis was performed using the standard protocol during bolus administration of intravenous contrast. Multiplanar reconstructed images and MIPs were obtained and reviewed to evaluate the vascular anatomy.  CONTRAST:  168mL OMNIPAQUE IOHEXOL 350 MG/ML SOLN IV  COMPARISON:  02/26/2014  FINDINGS: CTA CHEST FINDINGS  Extensive atherosclerotic calcifications aorta, coronary arteries and proximal great vessels.  Endograft identified beginning at proximal descending thoracic aorta extending into abdomen.  No high attenuation mural crescent identified on precontrast imaging.  Descending thoracic aorta stable in size.  Aneurysmal dilatation of the ascending thoracic aorta measuring 4.7  cm transverse immediately proximal to aortic arch, 4.5 cm on previous exam.  Minimal low-attenuation pericardial effusion.  No evidence of dissection or periaortic hemorrhage.  Scattered atelectasis greater in lower lobes and lateral RIGHT upper lobe.  Small BILATERAL pleural effusions.  Minimal patchy infiltrate in lingula.  No thoracic adenopathy or pneumothorax.  Bones demineralized.  Review of the MIP images confirms the above findings.  CTA ABDOMEN AND PELVIS FINDINGS  Aortic endograft extends from distal aspect of aortic arch through abdominal aorta and aortic bifurcation into the LEFT common iliac artery and RIGHT internal and external iliac arteries.  Aneurysmal dilatation of the proximal abdominal aorta beginning below the diaphragmatic hiatus and extending to the SMA origin, 6.9 x 6.4 cm image 121 previously 6.9 x 6.1 cm.  Second fusiform focus of aortic dilatation at distal abdominal aorta at bifurcation, 4.1 x 4.6 cm image 200, previously 4.4 x 3.9 cm.  Third area of aneurysmal dilatation located at RIGHT common iliac artery, 4.3 cm diameter image 223, previously 4.4 cm.  Several small foci of calcification are seen within the aneurysm thrombus without definite evidence of endoleak or acute perianeurysmal hemorrhage.  Infiltration of periaortic fat anterior to the proximal abdominal aortic aneurysm at the level of the celiac artery appears unchanged, question related to prior aneurysm rupture/hemorrhage, perianeurysmal fibrosis or infection.  Small pseudoaneurysm identified at the celiac stent on 01/03/2014 exam is not visualized on the current exam nor on the previous study of 02/26/2014 due to interval intervention.  No discrete fluid collection to suggest abscess is identified.  LEFT renal cyst stable.  Liver, spleen, atrophic pancreas, kidneys, and adrenal glands otherwise unremarkable.  Gastrostomy tube present.  Bowel loops suboptimally assessed due to lack of GI contrast, no gross abnormality  seen.  No additional mass, adenopathy, free fluid, or free air.  IVC filter noted.  Degenerative disc and facet disease changes thoracolumbar spine.  Review of the MIP images confirms the above findings.  IMPRESSION:  Post stenting of thoracoabdominal aorta through aortic bifurcation into the iliac systems bilaterally farther on RIGHT.  Additional stenting of the celiac, SMA and renal arteries.  Areas of aortic dilatation appear fairly stable in sizes since previous exam.  Perianeurysmal infiltration centered  at the level of the celiac artery appears little changed from the previous exam favor related to prior hemorrhage and/or fibrosis, less likely infection.  No discrete abscess collection is identified though graft infection cannot be excluded by CT.  Scattered mild pulmonary atelectasis and small pleural effusions as above.   Electronically Signed   By: Lavonia Dana M.D.   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  EXAM: CTA ABDOMEN AND PELVIS WITHOUT CONTRAST  TECHNIQUE: Multidetector CT imaging of the abdomen and pelvis was performed using the standard protocol during bolus administration of intravenous contrast. Multiplanar reconstructed images and MIPs were obtained and reviewed to evaluate the vascular anatomy.  CONTRAST:  128mL OMNIPAQUE IOHEXOL 350 MG/ML SOLN  COMPARISON:  CTA of the chest, abdomen and pelvis on 02/26/2014 and multiple other imaging studies.  FINDINGS: Complex thoracoabdominal demonstrated endograft placement again noted. Only the lower portion of the descending thoracic component is visualized on the study. The endograft shows stable patency and positioning. Fenestration with stenting of the celiac axis, superior mesenteric artery, bilateral single renal  arteries and graft extensions into the right internal and external iliac arteries show normal patency.  Stable soft tissue thickening remains around the celiac axis with stent extension into the common hepatic artery. There no longer is contrast opacification of a separate pseudoaneurysm as depicted by CTA October, 2015. No new pseudoaneurysms identified. No evidence of abnormal air surrounding the endograft. No abnormal fluid collections.  On the arterial phase of imaging, no obvious endoleak is identified. A delayed sequence was not obtained on the current study. Native right common iliac aneurysm sac remains thrombosed. Distal left common iliac limb and both right internal and external iliac limb landing zones show normal patency and apposition. Native iliac and common femoral arteries showed no significant disease.  The visualized lung bases show small bilateral pleural effusions and bibasilar atelectasis. The liver, gallbladder, spleen and adrenal glands are unremarkable. There is some motion artifact on the study and the arms were also along the side of the body. There may be some subtle stranding in the fat adjacent to the tail of the pancreas. Correlation suggested with any clinical evidence of acute pancreatitis.  Bowel is unremarkable with no evidence of obstruction or thickening. No free air is identified. There is a gastrostomy tube positioned in the body of the stomach. An IVC filter is in place in the infrarenal IVC. The bladder is unremarkable. No hernias are identified. Bony structures show mild spondylosis of the lower thoracic and lumbar spine.  Review of the MIP images confirms the above findings.  IMPRESSION: 1. No further opacification of celiac axis pseudoaneurysm following extend extension into the common hepatic artery. There remains soft tissue thickening around the celiac axis at the level of prior mycotic pseudoaneurysm. No abnormal fluid collection is identified. 2. Stable patency of  complex fenestrated thoracoabdominal endograft extending into native iliac arteries bilaterally. 3. Small bilateral pleural effusions. 4. Motion artifact versus potential subtle inflammatory stranding adjacent to the tail of the pancreas. Recommend correlation with any clinical evidence of pancreatitis.   Electronically Signed   By: Aletta Edouard M.D.   On: 04/19/2014 14:00    ECG  Normal sinus rhythm with first-degree heart block.  Widespread T-wave inversion in the anterior leads.  Prolonged QTc  110 ms Since previous EKG of 04/29/14, heart rate has slowed and T wave inversion has increased. Personally reviewed.  ASSESSMENT AND PLAN  1.  Recurrent sepsis, currently significantly improved on antibiotics since admission yesterday.  Possible HCAP. 2.  Past history of thoracoabdominal aortic aneurysm repair with subsequent graft infection causing chronic endovascular leak.  His surgery was at [facility] 3.  Aortic stenosis, at least moderate 4.  Abnormal EKG with prolonged QT interval possibly secondary to prior Cipro. 5.  Previous PEG tube but now on a dysphagia 3 diet.  Patient denies difficulty swallowing. 6.  Chronic systolic congestive heart failure, clinically improved after Lasix in admission  Plan: Continue to avoid any drugs which would prolong the QT interval.  He is no longer on Cipro. In regard to his recurrent sepsis and his aortic valve disease, we will  arrange for a TEE for  evaluate his aortic valve stenosis and to rule out vegetations/infection on the valve.  The schedule is full for today and tomorrow.  We will plan for Thursday.  He will need to remain nothing by mouth after midnight Wednesday night.  Risks and benefits of TEE discussed with patient who agrees with proceeding.   Signed, Darlin Coco, MD  04/30/2014, 10:56 AM

## 2014-04-30 NOTE — Progress Notes (Addendum)
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 (Last course of treatment was on 03/07/2014), Pseudomonas bacteremia, dysphagia with recurrent aspiration requiring PEG tube placement, DVT status post IVC filter placement, hypertension, atrial flutter, dyslipidemia, chronic systolic heart failure with EF of 30% as per recent echo , with recurrent hospitalization in the past 2 months ( this is his 6th hospitalization in last 3 months mostly related to pneumonia and/or sepsis ) . Patient was recently discharged after being hospitalized for her next pain sepsis negative cultures and imaging. He was discharged on high-dose oral ciprofloxacin (750 mg twice daily) with plan on palliative care follow-up at skilled nursing facility.  As per the son when he went to visit patient this morning. He was found to be having chills and febrile. Patient reports having an episode of nonbloody vomiting yesterday morning. He also reports having productive cough for the last 2-3 days. Patient has been having small amount of food at the facility besides being on PEG feeds during the night. Patient denies headache, dizziness, chest pain, palpitations, SOB, abdominal pain, bowel or urinary symptoms. At baseline he is nonambulatory.  In the ED patient was found to be septic with temperature of 104.6 Fahrenheit, pulse of 125, respiratory rate of 32, blood pressure of 81/51 mmHg.  Blood work done showed WBC of 5.5, hemoglobin of 10.3, platelets of 114, normal chemistry, glucose of 121. Lactic acid was elevated to 2.5 UA was cloudy with moderate hemoglobin and positive leucocyte esterase. Chest x-ray showed possible left lower lobe infiltrate.  EKG showed sinus  tachycardia at 125 with anterior T-wave inversion and prolonged QTC of 591.  Sepsis was called and patient given empiric IV vancomycin and cefepime. He was given 3 L IV normal saline bolus after which blood pressure improved to 107/56 mmHg.   HPI/Subjective: 2/16 A/O 4, NAD, states no home O2 requirement, positive nonproductive cough, states has not been home since Thanksgiving.   Assessment/Plan: Sepsis Possible HCAP -IV hydration with normal saline at [facility] (patient fluid overloaded); Patient received 2 L IV normal saline bolus in the ED. -Continue empiric vancomycin and aztreonam.  -Spoke with ID consult Dr. Lucianne Lei dam. Given recurrent specialization for sepsis is no clear underlying etiology. Recommended to repeat CT angiogram of the chest and abdomen; nondiagnostic for infection caused. -Patient on high-dose oral ciprofloxacin upon recent discharge, however DC secondary to QT prolongation -Consult ID following culture and imaging results.   HCAP (healthcare-associated pneumonia) -Empiric antibiotics as outlined above. Follow cultures. Check sputum culture, urine for strep antigen and Legionella antigen.  Systolic Congestive heart failure -Strict in and out; since admission+ 949ml  -Daily weight; admission weight= 97.9 kg;     2/16 weight= 98.8 kg -Hold BP medication relatively hypotensive -Patient's PCXR from 2/15 shows pulmonary edema; Lasix 40 mg 1   Abnormal EKG -When compared to EKG from 2/15 nonspecific ST-T wave flattening, flipped T waves V4 through V6  AAA (abdominal aortic aneurysm) -Stable per CT scan 2/15  Chronic dysphagia -Patient with history of dysphagia, maintain NPO -Nutrition consult for PEG tube feedings   ?UTI -Follow urine culture.  History of C. Difficile -Recent stool for c diff negative. Monitor for now while on antibiotics.  Prolonged QTC -Increasing QTC on repeat EKG 2/16  -Hold ciprofloxacin,  Pepcid, Toviaz, decrease Remeron to 15 mg -Troponin  3 pending -CMP, magnesium pending  DVT of lower extremity (Feb 2015) -post IVC filter -Patient did not come in on anticoagulation; fall risk? -Per oncology note 12/26/13 with NP Laverna Peace; thrombo-embolism in the left leg. He was on Xarelto. Patient was to stay on Xarelto for a year.  -While in the hospital will start on treatment dose Lovenox, and revisit prior to discharge if patient should not be discharged on this regimen. -Oncology consult pending.  Leukopenia  -Will monitor closely patient's platelets and if they continue to drop will stop Lovenox  -Await oncology recommendations (restart Xarelto?)   Protein-calorie malnutrition, severe -Obtain albumin -Restart tube feeds per nutrition    Code Status: FULL Family Communication: no family present at time of exam Disposition Plan: SNF?    Consultants: Cardiology consult pending Oncology consult pending   Procedure/Significant Events: 1/27 echocardiogram;- LVEF= 35% to 40%. Diffuse hypokinesis. - Aortic valve: moderately stenotic.  - Left atrium: mildly to moderately dilated. 2/15 CT angiogram chest PE protocol;-Post stenting of thoracoabdominal aorta through aortic bifurcation into the iliac systems bilaterally farther on RIGHT. -Additional stenting of the celiac, SMA and renal arteries. 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 -Scattered mild pulmonary atelectasis and small pleural effusions  2/15 PCXR;Cardiomegaly with mild CHF. Hazy left lower lobe airspace disease ;atelectasis vs pneumonia.  Culture 2/15 blood left antecubital/hand NGTD 2/15 urine pending  Antibiotics: Aztreonam 2/15>> Vancomycin 2/15>>   DVT prophylaxis: Lovenox   Devices IVC filter   LINES / TUBES:      Continuous Infusions: . sodium chloride 100 mL/hr at 04/30/14 0214     Objective: VITAL SIGNS: Temp: 98 F (36.7 C) (02/16 0746) Temp Source: Oral (02/16 0746) BP: 110/56 mmHg (02/16 0746) Pulse Rate: 78 (02/16 0746) SPO2; FIO2:   Intake/Output Summary (Last 24 hours) at 04/30/14 0815 Last data filed at 04/30/14 0700  Gross per 24 hour  Intake   1795 ml  Output    850 ml  Net    945 ml     Exam: General: A/O 4, NAD, No acute respiratory distress Lungs: diffuse rhonchi, decreased breath sounds LLL, with wheezes RUL  Cardiovascular: Regular rate and rhythm without murmur gallop or rub normal S1 and S2 Abdomen: Nontender, nondistended, soft, bowel sounds positive, no rebound, no ascites, no appreciable mass, PEG tube in place negative sign of infection Extremities: No significant cyanosis, clubbing; bilateral +1 pedal edema   Data Reviewed: Basic Metabolic Panel:  Recent Labs Lab 04/29/14 1107  NA 134*  K 4.1  CL 103  CO2 25  GLUCOSE 121*  BUN 21  CREATININE 1.16  CALCIUM 9.1  MG 1.7   Liver Function Tests:  Recent Labs Lab 04/29/14 1107  AST 26  ALT 12  ALKPHOS 57  BILITOT 0.4  PROT 6.1  ALBUMIN 2.4*   No results for input(s): LIPASE, AMYLASE in the last 168 hours. No results for input(s): AMMONIA in the last 168 hours. CBC:  Recent Labs Lab 04/29/14 1107  WBC 5.5  NEUTROABS 4.2  HGB 10.3*  HCT 30.7*  MCV 82.5  PLT 114*   Cardiac Enzymes: No results for input(s): CKTOTAL, CKMB, CKMBINDEX, TROPONINI in the last 168 hours. BNP (last 3 results)  Recent Labs  04/09/14 1918  BNP 1434.7*    ProBNP (last 3 results) No results for input(s): PROBNP  in the last 8760 hours.  CBG:  Recent Labs Lab 04/23/14 1202  GLUCAP 82    Recent Results (from the past 240 hour(s))  Blood Culture (routine x 2)     Status: None (Preliminary result)   Collection Time: 04/29/14 11:15 AM  Result Value Ref Range Status   Specimen Description BLOOD LEFT ANTECUBITAL  Final   Special Requests BOTTLES DRAWN AEROBIC AND  ANAEROBIC 5CC  Final   Culture   Final           BLOOD CULTURE RECEIVED NO GROWTH TO DATE CULTURE WILL BE HELD FOR 5 DAYS BEFORE ISSUING A FINAL NEGATIVE REPORT Performed at [facility]    Report Status PENDING  Incomplete  Blood Culture (routine x 2)     Status: None (Preliminary result)   Collection Time: 04/29/14 11:18 AM  Result Value Ref Range Status   Specimen Description BLOOD HAND LEFT  Final   Special Requests BOTTLES DRAWN AEROBIC AND ANAEROBIC 5CC  Final   Culture   Final           BLOOD CULTURE RECEIVED NO GROWTH TO DATE CULTURE WILL BE HELD FOR 5 DAYS BEFORE ISSUING A FINAL NEGATIVE REPORT Performed at [facility]    Report Status PENDING  Incomplete     Studies:  Recent x-ray studies have been reviewed in detail by the Attending Physician  Scheduled Meds:  Scheduled Meds: . antiseptic oral rinse  7 mL Mouth Rinse BID  . aztreonam  2 g Intravenous Q8H  . enoxaparin (LOVENOX) injection  40 mg Subcutaneous Q24H  . feeding supplement (PRO-STAT SUGAR FREE 64)  30 mL Oral TID WC  . lactulose  10 g Oral Daily  . magnesium oxide  200 mg Oral Daily  . methylphenidate  5 mg Oral Daily  . mirtazapine  15 mg Oral QHS  . simvastatin  20 mg Oral Daily  . vancomycin  1,250 mg Intravenous Q24H    Time spent on care of this patient: 40 mins   Allie Bossier [facility]  [facility] Office  [phone] Pager - [phone]  On-Call/Text Page:      [email or web address]      password [email or web address]  If 7PM-7AM, please contact night-coverage [email or web address] Password [email or web address] 04/30/2014, 8:15 AM   LOS: 1 day   Care during the described time interval was provided by me .  I have reviewed this patient's available data, including medical history, events of note, physical examination, radiology studies and test results as part of my evaluation  Dia Crawford, MD [phone] Pager

## 2014-04-30 NOTE — Progress Notes (Signed)
ANTICOAGULATION CONSULT NOTE - Initial Consult  Pharmacy Consult for Lovenox Indication: hx DVT  Allergies  Allergen Reactions  . Atorvastatin Other (See Comments)    Causes constipation  . Penicillins Other (See Comments)    Other reaction(s): RASH HIVES     Patient Measurements: Height: 6\' 2"  (188 cm) Weight: 217 lb 13 oz (98.8 kg) IBW/kg (Calculated) : 82.2  Vital Signs: Temp: 97.7 F (36.5 C) (02/16 1157) Temp Source: Oral (02/16 1157) BP: 97/49 mmHg (02/16 1157) Pulse Rate: 73 (02/16 1157)  Labs:  Recent Labs  04/29/14 1107 04/30/14 0900  HGB 10.3* 8.1*  HCT 30.7* 25.1*  PLT 114* 88*  CREATININE 1.16 0.93  TROPONINI  --  <0.03    Estimated Creatinine Clearance: 76.9 mL/min (by C-G formula based on Cr of 0.93).   Medical History: Past Medical History  Diagnosis Date  . Hypertension   . BPH (benign prostatic hyperplasia)   . Hyperlipidemia   . AAA (abdominal aortic aneurysm) 04/2012    STENTING OF AAA IN [place]  . Junctional cardiac arrhythmia     Occurred postoperatively after urologic surgery  . Severe sinus bradycardia     Occurred postoperatively after urologic surgery  . Aortic valve disorders   . Other primary cardiomyopathies   . Dysphagia   . Status post insertion of percutaneous endoscopic gastrostomy (PEG) tube 02/11/14  . Bacteremia due to Pseudomonas   . Infected aortic graft   . DVT (deep venous thrombosis) 04/2013    s/p IVC Filter  . Cancer 2008-2009    prostate TREATED WITH RADIATION   Assessment:  Lovenox 40 mg sq q24hrs begun last night at 8pm. Requested to increase to full-dose today for hx left leg DVT on May 03, 2013.  At that time, he was in the hospital for urologic procedures. Anticoagulation was begun, then held due to persistent hematuria. IVC filter placed on 05/06/13.  Heparin and Warfarin were resumed when hematuria resolved. He remained on Coumadin for several months, but was changed to Xarelto during April 2015  admission. Xarelto held in July for procedure in [place], then resumed.  Per last heme/onc note on 12/26/13, he was to be on anticoagulation for 1 year from Feb 2015, to treat DVT and to try to prevent postphlebitic syndrome. A follow-up appointment had been scheduled for yesterday, but it was cancelled due to weather. Thrombocytopenia, thought to be chronic.  Hx hematuria, but patient reports none recently, and no recent bleeding.  He reported that he thought that he was still on anticoagulation.  Discussed with Dr. Sherral Hammers.  Almost one year to the day from prior DVT.  He would like resume full-dose anticoagulation for now. Planning to consult with heme/onc.  Goal of Therapy:  Anti-Xa level 0.6-1 units/ml 4hrs after LMWH dose given Monitor platelets by anticoagulation protocol: Yes   Plan:   Lovenox 100 mg sq q12hrs.  CBC in am.  Monitor for any bleeding.  Follow up for anticoagulation plans.  Arty Baumgartner, [place] Pager: [phone] 04/30/2014,12:11 PM

## 2014-04-30 NOTE — Progress Notes (Signed)
Speech Language Pathology Treatment: Dysphagia  Patient Details Name: [name] MRN: [id number] DOB: 1929-05-27 Today's Date: 04/30/2014 Time: 2482-5003 SLP Time Calculation (min) (ACUTE ONLY): 8 min  Assessment / Plan / Recommendation Clinical Impression  Pt with functional swallow with much improved willingness and ability to eat after receiving new dentures.  Mastication is sufficient, swallow trigger is consistent, there are no overt s/s of aspiration.  Recommend continuing current diet per pt's preferences.  Pt does not need SLP f/u.  Will sign off.    HPI HPI: 81 y/o M with PMH of DVT s/p IVC filter, AAA repair with prior cultures positive for pseudomonas & proteus who was admitted 2/3 with reports of fever and concern for sepsis. Pt with PEG for supplemental nutrition. Followed by SLP services during admission 01/23/14 with MBS revealing functional swallow, no penetration/aspiration but highly sensitive gag response when eating. PEG was placed 02/11/14. Repeat BSE on 04/19/14 with recommendation to begin a chopped diet and thin liquids.  Pt states he has lost a significant amount of weight and attributed poor intake to non-fitting dentures. Pt currently has new dentures that he reports fit well.     Pertinent Vitals Pain Assessment: No/denies pain  SLP Plan  All goals met    Recommendations Diet recommendations: Dysphagia 3 (mechanical soft);Thin liquid Liquids provided via: Cup;Straw Medication Administration: Whole meds with liquid Supervision: Patient able to self feed Compensations: Slow rate Postural Changes and/or Swallow Maneuvers: Seated upright 90 degrees              Oral Care Recommendations: Oral care BID Follow up Recommendations: None Plan: All goals met   Suzette Flagler L. Tivis Ringer, [place] CCC/SLP Pager [phone]      [name] 04/30/2014, 4:16 PM

## 2014-05-01 DIAGNOSIS — R9431 Abnormal electrocardiogram [ECG] [EKG]: Secondary | ICD-10-CM | POA: Diagnosis present

## 2014-05-01 DIAGNOSIS — Z515 Encounter for palliative care: Secondary | ICD-10-CM

## 2014-05-01 DIAGNOSIS — Z9889 Other specified postprocedural states: Secondary | ICD-10-CM

## 2014-05-01 DIAGNOSIS — B965 Pseudomonas (aeruginosa) (mallei) (pseudomallei) as the cause of diseases classified elsewhere: Secondary | ICD-10-CM

## 2014-05-01 DIAGNOSIS — R7881 Bacteremia: Secondary | ICD-10-CM

## 2014-05-01 DIAGNOSIS — Z86718 Personal history of other venous thrombosis and embolism: Secondary | ICD-10-CM

## 2014-05-01 DIAGNOSIS — Z8679 Personal history of other diseases of the circulatory system: Secondary | ICD-10-CM

## 2014-05-01 DIAGNOSIS — I429 Cardiomyopathy, unspecified: Secondary | ICD-10-CM

## 2014-05-01 DIAGNOSIS — I350 Nonrheumatic aortic (valve) stenosis: Secondary | ICD-10-CM

## 2014-05-01 LAB — RETICULOCYTES
RBC.: 3.35 MIL/uL — ABNORMAL LOW (ref 4.22–5.81)
RETIC CT PCT: 1.6 % (ref 0.4–3.1)
Retic Count, Absolute: 53.6 10*3/uL (ref 19.0–186.0)

## 2014-05-01 LAB — APTT: APTT: 52 s — AB (ref 24–37)

## 2014-05-01 LAB — PROTIME-INR
INR: 1.25 (ref 0.00–1.49)
PROTHROMBIN TIME: 15.9 s — AB (ref 11.6–15.2)

## 2014-05-01 LAB — D-DIMER, QUANTITATIVE: D-Dimer, Quant: 9.95 ug/mL-FEU — ABNORMAL HIGH (ref 0.00–0.48)

## 2014-05-01 LAB — CBC
HCT: 27.7 % — ABNORMAL LOW (ref 39.0–52.0)
Hemoglobin: 8.8 g/dL — ABNORMAL LOW (ref 13.0–17.0)
MCH: 26.9 pg (ref 26.0–34.0)
MCHC: 31.8 g/dL (ref 30.0–36.0)
MCV: 84.7 fL (ref 78.0–100.0)
Platelets: 97 10*3/uL — ABNORMAL LOW (ref 150–400)
RBC: 3.27 MIL/uL — ABNORMAL LOW (ref 4.22–5.81)
RDW: 17.5 % — AB (ref 11.5–15.5)
WBC: 3.6 10*3/uL — AB (ref 4.0–10.5)

## 2014-05-01 NOTE — Progress Notes (Signed)
Subjective:  Awake and alert, flat in bed- no SOB  Objective:  Vital Signs in the last 24 hours: Temp:  [97.4 F (36.3 C)-98.2 F (36.8 C)] 98 F (36.7 C) (02/17 0417) Pulse Rate:  [69-122] 88 (02/17 0748) Resp:  [17-27] 20 (02/17 0748) BP: (90-133)/(49-68) 97/49 mmHg (02/17 0748) SpO2:  [91 %-100 %] 95 % (02/17 0748) Weight:  [211 lb 13.8 oz (96.1 kg)] 211 lb 13.8 oz (96.1 kg) (02/17 0500)  Intake/Output from previous day:  Intake/Output Summary (Last 24 hours) at 05/01/14 0932 Last data filed at 05/01/14 0748  Gross per 24 hour  Intake   2352 ml  Output   3926 ml  Net  -1574 ml    Physical Exam: General appearance: alert, cooperative and no distress Lungs: clear to auscultation bilaterally Heart: regular rate and rhythm and 2/6 systolic murmur   Rate: 86  Rhythm: normal sinus rhythm and runs of tachycardia and PVCs.   Lab Results:  Recent Labs  04/30/14 0900 05/01/14 0340  WBC 4.1 3.6*  HGB 8.1* 8.8*  PLT 88* 97*    Recent Labs  04/29/14 1107 04/30/14 0900  NA 134* 136  K 4.1 3.6  CL 103 112  CO2 25 22  GLUCOSE 121* 89  BUN 21 22  CREATININE 1.16 0.93    Recent Labs  04/30/14 1401 04/30/14 2005  TROPONINI <0.03 <0.03   No results for input(s): INR in the last 72 hours.  Imaging: Imaging results have been reviewed  Cardiac Studies:  Assessment/Plan:  81 year old gentleman is status post repair of abdominal aortic aneurysm in 2014. 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 He has been treated with prolonged antibiotics. He has a history of Pseudomonas bacteremia. He was most recently treated with outpatient high-dose oral ciprofloxacin 750 mg twice a day. This has subsequently been stopped because of prolonged QTc interval. He was admitted with fever 04/29/14.   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 He is on the schedule for TEE tomorrow at 9am. Orders written. Check EKG/QTc this am.    [facility] has been requested to perform a transesophageal echocardiogram on 05/02/14 for fever.  After careful review of history and examination, the risks and benefits of transesophageal echocardiogram have been explained including risks of esophageal damage, perforation (1:10,000 risk), bleeding, pharyngeal hematoma as well as other potential complications associated with conscious sedation including aspiration, arrhythmia, respiratory failure and death. Alternatives to treatment were discussed, questions were answered. Patient is willing to proceed.    Kerin Ransom PA-C Beeper [phone] 05/01/2014, 9:32 AM   The patient was seen, examined and discussed with Kerin Ransom, PA-C and I agree with the above.   ECG shows persistent QT prolongation, with minimal improvement yesterday QT/QTc 554/610 , today 486/578 ms. Continue  to avoid any drugs which would prolong the QT interval. The patient is on schedule for TEE tomorrow. We will follow.   Dorothy Spark 05/01/2014

## 2014-05-01 NOTE — Progress Notes (Signed)
[facility] TEAM 1 - Stepdown/ICU TEAM Progress Note  [name] [id number]:[id number] DOB: June 11, 1929 DOA: 04/29/2014 PCP: [name], MD  Admit HPI / Brief Narrative: 81 year old male with PMH of AAA s/p repair and graft infection causing chronic endovascular leak and infection with prolonged antibiotic course. (Last course of treatment was on 03/07/2014), Pseudomonas bacteremia, dysphagia with recurrent aspiration requiring PEG tube placement, DVT s/p IVC filter placement, hypertension, atrial flutter, dyslipidemia, systolic CHF (EF of 01%), with recurrent hospitalizations related to PNA and/or sepsis (this is 6th hospitalization in last 3 months).  Patient was recently hospitalized for sepsis and discharged  on high-dose oral ciprofloxacin (750 mg twice daily) with plan on palliative care follow-up at SNF. As per the son,  When visiting his father morning of admission, he found him with fever and chills. Patient reports  an episode of nonbloody vomiting yesterday morning, and having productive cough for the last 2-3 days. Patient has been having small amount of food at the facility besides being on PEG feeds during the night. He denies headache, dizziness, chest pain, palpitations, SOB, abdominal pain, bowel or urinary symptoms.He is nonambulatory at baseline. In ED, workup revealed T 104.6, pulse 125, RR 32, BP 81/51, and lactic acid was elevated at 2.5. UA was cloudy with mod Hgb and small leukocyte. CXR with possible left LL infilterate. EKG with sinus tachycardia  At 125, and anterior T wave inversion, prolonged QTc 591.  He was placed on IV Vancomycin and Cefepime, given 3L NS bolus, with improvement in BP. Pt was then admitted with sepsis for further workup.      HPI/Subjective: 2/16 A/O 4, NAD, complains of cough. denies any sob, cp, or other complaints.  Assessment/Plan:  Sepsis possibly secondary to HCAP -Continue empiric vancomycin and aztreonam -Spoke with ID consult Dr. Lucianne Lei dam-  Given recurrent specialization for sepsis is no clear underlying etiology. Recommended to repeat CT angiogram of the chest and abdomen; nondiagnostic for infection caused. -High dose Cipro on recent discharge DC due to QT prolongation  -Consult ID following culture and imaging results. -blood/ sputum cxs pending -urine strep/ legionella antigen negative  Systolic Congestive heart failure -Strict in and out; since admission -459 l  -Daily weight; admission= 97.9 kg;     2/17 weight= 96.1 kg -Hold BP medication relatively hypotensive -Patient's PCXR from 2/15 shows pulmonary edema; Lasix 40 mg 1   Prolonged QTC -Increasing QTC on repeat EKG 2/16 -hold ciprofloxacin, Pepcid, Toviaz, decrease Remeron to 15 mg -Cards following- TEE 2/18 to evaluate aortic stenosis, rule out vegetation -Trop x3  negative -Mag normal  DVT of lower extremity (Feb 2015) -s/p IVC filter-not on anticoagulation, fall risk? -Per oncology note 12/26/13 with NP Laverna Peace; thrombo-embolism in the left leg. He was on Xarelto. Patient was to stay on Xarelto for a year.  -Started on Lovenox, must revisit at discharge to  -Appreciate Oncology consult- continue Lovenox, stop if plat drop < 50k-determine need for it.   Leukopenia/Anemia/Thrombocytopenia -Per Oncology- plat 97, if <50 dc lovenox. Will order recommended labs -  Abnormal EKG -When compared to EKG from 2/15 nonspecific ST-T wave flattening, flipped T waves V4 through V6  AAA (abdominal aortic aneurysm) -Stable per CT scan 2/15  Chronic dysphagia -Patient with history of dysphagia, maintain NPO -Nutrition consult for PEG tube feedings   ?UTI -UA with mod Hgb, small leukocytes, few bacteria -urine culture- no growth  History of C. Difficile -Recent stool for c diff negative. Monitor for now while on antibiotics.  Protein-calorie malnutrition, severe -Obtain albumin -Restart tube feeds per nutrition    Code Status: FULL Family  Communication: no family present at time of exam Disposition Plan: transfer to tele    Consultants: Cardiology consult pending Oncology consult pending   Procedure/Significant Events: 1/27 echocardiogram;- LVEF= 35% to 40%. Diffuse hypokinesis. - Aortic valve: moderately stenotic.  - Left atrium: mildly to moderately dilated. 2/15 CT angiogram chest PE protocol;-Post stenting of thoracoabdominal aorta through aortic bifurcation into the iliac systems bilaterally farther on RIGHT. -Additional stenting of the celiac, SMA and renal arteries. 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 -Scattered mild pulmonary atelectasis and small pleural effusions  2/15 PCXR;Cardiomegaly with mild CHF. Hazy left lower lobe airspace disease ;atelectasis vs pneumonia.  Culture 2/15 blood left antecubital/hand NGTD 2/15 urine pending  Antibiotics: Aztreonam 2/15>> Vancomycin 2/15>>   DVT prophylaxis: Lovenox   Devices IVC filter   LINES / TUBES:      Continuous Infusions:    Objective: VITAL SIGNS: Temp: 97.8 F (36.6 C) (02/17 1500) Temp Source: Oral (02/17 1500) BP: 133/74 mmHg (02/17 1100) Pulse Rate: 88 (02/17 0748) SPO2; FIO2:   Intake/Output Summary (Last 24 hours) at 05/01/14 1737 Last data filed at 05/01/14 1144  Gross per 24 hour  Intake   1900 ml  Output   1075 ml  Net    825 ml     Exam: General: A/O 4, NAD, No acute respiratory distress Lungs: diffuse rhonchi, decreased breath sounds LLL, with wheezes RUL  Cardiovascular: Regular rate and rhythm . Murmur heard Abdomen: Nontender, nondistended, soft, bowel sounds positive, no rebound, no ascites, no appreciable mass, PEG tube in place negative sign of infection Extremities: No significant cyanosis, clubbing; bilateral +1 pedal edema   Data Reviewed: Basic Metabolic  Panel:  Recent Labs Lab 04/29/14 1107 04/30/14 0900  NA 134* 136  K 4.1 3.6  CL 103 112  CO2 25 22  GLUCOSE 121* 89  BUN 21 22  CREATININE 1.16 0.93  CALCIUM 9.1 8.1*  MG 1.7 1.8   Liver Function Tests:  Recent Labs Lab 04/29/14 1107 04/30/14 0900  AST 26 19  ALT 12 11  ALKPHOS 57 39  BILITOT 0.4 0.4  PROT 6.1 5.0*  ALBUMIN 2.4* 2.0*   No results for input(s): LIPASE, AMYLASE in the last 168 hours. No results for input(s): AMMONIA in the last 168 hours. CBC:  Recent Labs Lab 04/29/14 1107 04/30/14 0900 05/01/14 0340  WBC 5.5 4.1 3.6*  NEUTROABS 4.2 3.1  --   HGB 10.3* 8.1* 8.8*  HCT 30.7* 25.1* 27.7*  MCV 82.5 84.8 84.7  PLT 114* 88* 97*   Cardiac Enzymes:  Recent Labs Lab 04/30/14 0900 04/30/14 1401 04/30/14 2005  TROPONINI <0.03 <0.03 <0.03   BNP (last 3 results)  Recent Labs  04/09/14 1918  BNP 1434.7*    ProBNP (last 3 results) No results for input(s): PROBNP in the last 8760 hours.  CBG: No results for input(s): GLUCAP in the last 168 hours.  Recent Results (from the past 240 hour(s))  Blood Culture (routine x 2)     Status: None (Preliminary result)   Collection Time: 04/29/14 11:15 AM  Result Value Ref Range Status   Specimen Description BLOOD LEFT ANTECUBITAL  Final   Special Requests BOTTLES DRAWN AEROBIC AND ANAEROBIC 5CC  Final   Culture   Final    Lonell Grandchild  NEGATIVE RODS Note: Gram Stain Report Called to,Read Back By and Verified With: CAROL SCHEILLER RN 04/30/14 AT 14:00 BY [facility] Performed at [facility]    Report Status PENDING  Incomplete  Blood Culture (routine x 2)     Status: None (Preliminary result)   Collection Time: 04/29/14 11:18 AM  Result Value Ref Range Status   Specimen Description BLOOD HAND LEFT  Final   Special Requests BOTTLES DRAWN AEROBIC AND ANAEROBIC 5CC  Final   Culture   Final    GRAM NEGATIVE RODS Performed at [facility]    Report Status PENDING  Incomplete  Urine culture      Status: None   Collection Time: 04/29/14 11:51 AM  Result Value Ref Range Status   Specimen Description URINE, CATHETERIZED  Final   Special Requests NONE  Final   Colony Count NO GROWTH Performed at [facility]   Final   Culture NO GROWTH Performed at [facility]   Final   Report Status 04/30/2014 FINAL  Final     Studies:  Recent x-ray studies have been reviewed in detail by the Attending Physician  Scheduled Meds:  Scheduled Meds: . antiseptic oral rinse  7 mL Mouth Rinse BID  . aztreonam  2 g Intravenous Q8H  . enoxaparin (LOVENOX) injection  100 mg Subcutaneous Q12H  . feeding supplement (JEVITY 1.2 CAL)  1,000 mL Per Tube Q24H  . feeding supplement (PRO-STAT SUGAR FREE 64)  30 mL Oral TID WC  . lactulose  10 g Oral Daily  . magnesium oxide  200 mg Oral Daily  . methylphenidate  5 mg Oral Daily  . mirtazapine  15 mg Oral QHS  . simvastatin  20 mg Oral Daily  . vancomycin  1,250 mg Intravenous Q24H    Time spent on care of this patient: 40 mins   Lacy Duverney , [facility]  [facility] Office  [phone] Pager - [phone]  On-Call/Text Page:      [email or web address]      password [email or web address]  If 7PM-7AM, please contact night-coverage [email or web address] Password [facility] 05/01/2014, 5:37 PM   LOS: 2 days   Care during the described time interval was provided by me .  I have reviewed this patient's available data, including medical history, events of note, physical examination, radiology studies and test results as part of my evaluation  Dia Crawford, MD [phone] Pager

## 2014-05-01 NOTE — Progress Notes (Signed)
Report called to brandy rn and pt transferred via bed with belongings, pt's family at bedside. Notified family of TEE in am scheduled for 8am.

## 2014-05-01 NOTE — Consult Note (Addendum)
Patient [name]      DOB: 1929/05/14      HOZ:[id number]     Consult Note from the Palliative Medicine Team at [facility] Requested by:  Dr Clementeen Graham     PCP: [name], MD Reason for Consultation: goals of care, FTT     Phone [phone]  Assessment/Recommendations: 81 yo male with PMHx of thoraco-aortic aneursym s/p graft with recurrent pseudomonal infection. Known to me from prior admission. Admitted with recurrent sepsis, FTT  1.  Code Status: Documented as partial code with only limitation being intubation. I do not think CPR would be helpful without intubation.   2. Goals of Care: I know [name] from admission late last year (december).  This is his 4th admission since I saw him last.  At one time, family was talking to [facility] ID about palliative care (though it sounds more like [facility] physicians were talking about hospice care), and family was not interested in comfort care approach and his abx regimen remained challenging.  They did have a goal of getting him home, but with his recurrent hospital stays, it does not appear that this has been able to happen. Unfortunaly, [name] lost his hearing aids at nursing home a few weeks ago and is awaiting replacement. Hard for him to participate in more than basic conversation.  I have reached out to his daughters who I met in the past and am awaiting call back from them.    3. Symptom Management:   1. FTT- On nocturnal TF and dietary consulted. Passed swallow and has dysphagia diet.  Concerns for PNA in multiple readmissions recently. Consider PT consult.   4. Psychosocial/Spiritual:   From [place]. Has 3 daughters and 1 son. Has been hospitalized or in nursing facility for past several months.   ADDENDUM: Spoke with daughter [name] by phone today. She expressed frustration at recurrent hospital stays.  Their goal has been to get [name] back home, but it seems to them that when he makes progress towards  this goal at rehab, he has another setback> has not been home since October.  Still eating little PO by mouth and dependent on tube feeds.  He has expressed to family that he wants to "keep going" but at same time more difficult to convince him to go to rehab and he again voices today that he wants to go home.  Family not sure he understands what this means as they appropriately fear his decline there with continue re-hospitalizations. I asked if maybe he voices such a strong desire because he is worried about never getting strong enough to be back home. [name] wasn't sure.  He reportedly only has 28 more medicare covered rehab days as well.  He is having TEE tomorrow and I will plan on meeting with family/pt at [facility] on Friday to work through some of these issues. Hopefully with family assistance we can work through some of these issues with [name].   Brief HPI: Patient is a 81 yo male with PMHx of thoraco-aortic aneurysm s/p graft repair in July 7048 and complicated by recurrent pseudomonal graft infection, DVT, C-diff, FTT and aspiration s/p PEG.  He was admitted on 2/15 with probable sepsis/HCAP.  He is well known to me from admission in December. Since that time, he was admitted 03/23/14 with sepsis/HCAP, 1/26 with diarrhea/sepsis/?HCAP, 2/3 with sepsis.  Most recently he was started on high dose cipro for his pseudomonal infections.  He presented from SNF with fevers  and concern for sepsis again.  His blood cultures here are growing GNR's.  Had CTA of aorta which did not show any signs of obvious abscess.  Cards consulted and plan for TEE on Thursday to evaluate aortic valve stenosis and r/o endocarditis.  ID following. Onc also following for DVT in setting of low platelets and concern for DIC given chronic infectious issues. [name] has no complaints for me this morning, but very hard of hearing as he lost his hearing aides a few weeks ago at [facility].       PMH:  Past Medical History  Diagnosis Date  .  Hypertension   . BPH (benign prostatic hyperplasia)   . Hyperlipidemia   . AAA (abdominal aortic aneurysm) 04/2012    STENTING OF AAA IN [place]  . Junctional cardiac arrhythmia     Occurred postoperatively after urologic surgery  . Severe sinus bradycardia     Occurred postoperatively after urologic surgery  . Aortic valve disorders   . Other primary cardiomyopathies   . Dysphagia   . Status post insertion of percutaneous endoscopic gastrostomy (PEG) tube 02/11/14  . Bacteremia due to Pseudomonas   . Infected aortic graft   . DVT (deep venous thrombosis) 04/2013    s/p IVC Filter  . Cancer 2008-2009    prostate TREATED WITH RADIATION     PSH: Past Surgical History  Procedure Laterality Date  . Abdominal surgery      PART OF COLON REMOVED FOR DIVERTICULITIS  . Appendectomy    . Hernia repair    . Bladder surgery  2008    FOR BLADDER STONE  . Total knee arthroplasty  1990    left  . Abdominal aortic aneurysm repair  04/2012  . Total knee arthroplasty Right 07/26/2012    Procedure: RIGHT TOTAL KNEE ARTHROPLASTY;  Surgeon: Tobi Bastos, MD;  Location: [facility];  Service: Orthopedics;  Laterality: Right;  . Nephrolithotomy Left 04/26/2013    Procedure: LEFT PERCUTANEOUS NEPHROLITHOTOMY ;  Surgeon: [name], MD;  Location: [facility];  Service: Urology;  Laterality: Left;  . Nephrolithotomy Left 05/03/2013    Procedure: 2ND STAGE LEFT PERCUTANEOUS NEPHROLITHOTOMY ;  Surgeon: [name], MD;  Location: [facility];  Service: Urology;  Laterality: Left;  . Esophagogastroduodenoscopy N/A 02/11/2014    IPJ:ASNKN hiatal hernia. Patchy erythema of the gastric mucosa; otherwise negative EGD. Status post 45 French Microvasive PEG tube placement  . Peg placement N/A 02/11/2014    Procedure: PERCUTANEOUS ENDOSCOPIC GASTROSTOMY (PEG) PLACEMENT;  Surgeon: Daneil Dolin, MD;  Location: [facility];  Service: Endoscopy;  Laterality: N/A;   I have reviewed the [place] and SH and  If appropriate  update it with new information. Allergies  Allergen Reactions  . Atorvastatin Other (See Comments)    Causes constipation  . Penicillins Other (See Comments)    Other reaction(s): RASH HIVES    Scheduled Meds: . antiseptic oral rinse  7 mL Mouth Rinse BID  . aztreonam  2 g Intravenous Q8H  . enoxaparin (LOVENOX) injection  100 mg Subcutaneous Q12H  . feeding supplement (JEVITY 1.2 CAL)  1,000 mL Per Tube Q24H  . feeding supplement (PRO-STAT SUGAR FREE 64)  30 mL Oral TID WC  . lactulose  10 g Oral Daily  . magnesium oxide  200 mg Oral Daily  . methylphenidate  5 mg Oral Daily  . mirtazapine  15 mg Oral QHS  . simvastatin  20 mg Oral Daily  . vancomycin  1,250  mg Intravenous Q24H   Continuous Infusions:  PRN Meds:.acetaminophen, albuterol, bisacodyl, magnesium hydroxide, sodium phosphate    BP 97/49 mmHg  Pulse 88  Temp(Src) 98 F (36.7 C) (Oral)  Resp 20  Ht 6' 2"  (1.88 m)  Wt 96.1 kg (211 lb 13.8 oz)  BMI 27.19 kg/m2  SpO2 95%   PPS: 50  Intake/Output Summary (Last 24 hours) at 05/01/14 0845 Last data filed at 05/01/14 0748  Gross per 24 hour  Intake   2352 ml  Output   3926 ml  Net  -1574 ml   Physical Exam:  General: Alert, NAD, HOH HEENT:  [place], sclera anicteric Chest:  CTAB CVS: regular rate Abdomen: soft, ND Ext: no edema   Labs: CBC    Component Value Date/Time   WBC 3.6* 05/01/2014 0340   WBC 5.8 12/26/2013 1105   WBC 5.4 02/28/2007 0919   RBC 3.27* 05/01/2014 0340   RBC 3.62* 02/21/2014 1127   RBC 4.10* 12/26/2013 1105   RBC 5.21 02/28/2007 0919   HGB 8.8* 05/01/2014 0340   HGB 11.0* 12/26/2013 1105   HGB 15.0 02/28/2007 0919   HCT 27.7* 05/01/2014 0340   HCT 34.5* 12/26/2013 1105   HCT 44.0 02/28/2007 0919   PLT 97* 05/01/2014 0340   PLT 157 12/26/2013 1105   PLT 153 02/28/2007 0919   MCV 84.7 05/01/2014 0340   MCV 84 12/26/2013 1105   MCV 84.4 02/28/2007 0919   MCH 26.9 05/01/2014 0340   MCH 26.8* 12/26/2013 1105   MCH 28.7  02/28/2007 0919   MCHC 31.8 05/01/2014 0340   MCHC 31.9* 12/26/2013 1105   MCHC 34.0 02/28/2007 0919   RDW 17.5* 05/01/2014 0340   RDW 17.5* 12/26/2013 1105   RDW 14.9* 02/28/2007 0919   LYMPHSABS 0.5* 04/30/2014 0900   LYMPHSABS 0.9 12/26/2013 1105   LYMPHSABS 1.6 02/28/2007 0919   MONOABS 0.4 04/30/2014 0900   MONOABS 0.6 02/28/2007 0919   EOSABS 0.1 04/30/2014 0900   EOSABS 0.1 12/26/2013 1105   EOSABS 0.3 02/28/2007 0919   BASOSABS 0.0 04/30/2014 0900   BASOSABS 0.0 12/26/2013 1105   BASOSABS 0.0 02/28/2007 0919    BMET    Component Value Date/Time   NA 136 04/30/2014 0900   NA 140 10/31/2013 0839   K 3.6 04/30/2014 0900   K 3.9 10/31/2013 0839   CL 112 04/30/2014 0900   CL 103 10/31/2013 0839   CO2 22 04/30/2014 0900   CO2 27 10/31/2013 0839   GLUCOSE 89 04/30/2014 0900   GLUCOSE 93 10/31/2013 0839   BUN 22 04/30/2014 0900   BUN 17 10/31/2013 0839   CREATININE 0.93 04/30/2014 0900   CREATININE 1.1 10/31/2013 0839   CALCIUM 8.1* 04/30/2014 0900   CALCIUM 8.9 10/31/2013 0839   GFRNONAA 76* 04/30/2014 0900   GFRAA 88* 04/30/2014 0900    CMP     Component Value Date/Time   NA 136 04/30/2014 0900   NA 140 10/31/2013 0839   K 3.6 04/30/2014 0900   K 3.9 10/31/2013 0839   CL 112 04/30/2014 0900   CL 103 10/31/2013 0839   CO2 22 04/30/2014 0900   CO2 27 10/31/2013 0839   GLUCOSE 89 04/30/2014 0900   GLUCOSE 93 10/31/2013 0839   BUN 22 04/30/2014 0900   BUN 17 10/31/2013 0839   CREATININE 0.93 04/30/2014 0900   CREATININE 1.1 10/31/2013 0839   CALCIUM 8.1* 04/30/2014 0900   CALCIUM 8.9 10/31/2013 0839   PROT 5.0* 04/30/2014 0900  ALBUMIN 2.0* 04/30/2014 0900   AST 19 04/30/2014 0900   ALT 11 04/30/2014 0900   ALKPHOS 39 04/30/2014 0900   BILITOT 0.4 04/30/2014 0900   GFRNONAA 76* 04/30/2014 0900   GFRAA 88* 04/30/2014 0900   2/15 CXR IMPRESSION: Cardiomegaly with mild CHF. Hazy left lower lobe airspace disease which may reflect atelectasis versus  pneumonia.   2/15 CTA chest/aorta IMPRESSION:  Post stenting of thoracoabdominal aorta through aortic bifurcation into the iliac systems bilaterally farther on RIGHT.  Additional stenting of the celiac, SMA and renal arteries.  Areas of aortic dilatation appear fairly stable in sizes since previous exam.  Perianeurysmal infiltration centered at the level of the celiac artery appears little changed from the previous exam favor related to prior hemorrhage and/or fibrosis, less likely infection.  No discrete abscess collection is identified though graft infection cannot be excluded by CT.  Scattered mild pulmonary atelectasis and small pleural effusions as above.  Total Time: 40 minutes Greater than 50%  of this time was spent counseling and coordinating care related to the above assessment and plan.  Doran Clay D.O. Palliative Medicine Team at [facility]  Pager: [phone] Team Phone: [phone]

## 2014-05-01 NOTE — Clinical Social Work Psychosocial (Signed)
Clinical Social Work Department BRIEF PSYCHOSOCIAL ASSESSMENT 05/01/2014  Patient:  [name], [name]     Account Number:  [id number]     Admit date:  04/29/2014  Clinical Social Worker:  Marciano Sequin  Date/Time:  05/01/2014 03:13 PM  Referred by:  RN  Date Referred:  05/01/2014 Referred for  SNF Placement   Other Referral:   Interview type:  Patient Other interview type:    PSYCHOSOCIAL DATA Living Status:  FACILITY Admitted from facility:  [facility] Level of care:  [place] Primary support name:  Elijah, [name] Primary support relationship to patient:  SPOUSE Degree of support available:   Strong Support    CURRENT CONCERNS Current Concerns  Post-Acute Placement   Other Concerns:    SOCIAL WORK ASSESSMENT / PLAN CSW met the pt at the bedside. Pt wears hearing aids. CSW informed that the pt hearing aids were lost during his stay at [facility]. CSW call the pt's family [name], wife and [name], daughter) via phone.  CSW introduced self and purpose of the visit. CSW and family discussed discharge disposition. The pt reported he would like to come home. [name] agreed with the pt. [name] reported the pt has been in and out SNF/ the hospital for several months. [name] reported her other siblings would like the pt to come home. [name] reported the family will only agree with the pt transition back to [facility] if the MD deems it necessary. CSW answered all questions in which [name] inquired about. CSW provided pt/family with contact information for further questions. CSW will continue to follow this pt and assist with discharge as needed.   Assessment/plan status:  Psychosocial Support/Ongoing Assessment of Needs Other assessment/ plan:   Information/referral to community resources:    PATIENT'S/FAMILY'S RESPONSE TO CURRENT DIAGNOSE; [name] expressed having some frustration with the pt being admitted twice for sepsis. [name] verbalized  understand that due to the pt's lengthy health history and age it feel as though being admitted to the hospital has become the normal for the pt. The pt's wife [name] did not verbalize/express her thoughts. [name] remained quiet, while [place] and the pt spoke.   PATIENT'S/FAMILY'S RESPONSE TO PLAN OF CARE: The pt and the pt's children prefers the pt to return home, but the family is receptive to the clinical recommendations for the staff. The pt's children reported needing to further their coversations with the MD to finialize a discharge plan of care.   [place], MSW, [place]

## 2014-05-02 ENCOUNTER — Encounter (HOSPITAL_COMMUNITY)
Admission: EM | Disposition: A | Discharge status: Home Health | Payer: Medicare Other | Source: Home / Self Care | Attending: Internal Medicine

## 2014-05-02 ENCOUNTER — Ambulatory Visit (HOSPITAL_COMMUNITY): Admit: 2014-05-02 | Payer: Self-pay | Admitting: Cardiology

## 2014-05-02 ENCOUNTER — Encounter (HOSPITAL_COMMUNITY): Payer: Self-pay | Admitting: *Deleted

## 2014-05-02 ENCOUNTER — Encounter (HOSPITAL_COMMUNITY): Payer: Self-pay

## 2014-05-02 DIAGNOSIS — A0471 Enterocolitis due to Clostridium difficile, recurrent: Secondary | ICD-10-CM

## 2014-05-02 DIAGNOSIS — B9689 Other specified bacterial agents as the cause of diseases classified elsewhere: Secondary | ICD-10-CM

## 2014-05-02 DIAGNOSIS — D696 Thrombocytopenia, unspecified: Secondary | ICD-10-CM

## 2014-05-02 DIAGNOSIS — I340 Nonrheumatic mitral (valve) insufficiency: Secondary | ICD-10-CM

## 2014-05-02 DIAGNOSIS — R7881 Bacteremia: Secondary | ICD-10-CM | POA: Diagnosis present

## 2014-05-02 DIAGNOSIS — I350 Nonrheumatic aortic (valve) stenosis: Secondary | ICD-10-CM

## 2014-05-02 DIAGNOSIS — A4189 Other specified sepsis: Secondary | ICD-10-CM

## 2014-05-02 DIAGNOSIS — Z95828 Presence of other vascular implants and grafts: Secondary | ICD-10-CM

## 2014-05-02 DIAGNOSIS — A498 Other bacterial infections of unspecified site: Secondary | ICD-10-CM | POA: Diagnosis present

## 2014-05-02 HISTORY — PX: TEE WITHOUT CARDIOVERSION: SHX5443

## 2014-05-02 LAB — IRON AND TIBC
Iron: 15 ug/dL — ABNORMAL LOW (ref 42–165)
Saturation Ratios: 8 % — ABNORMAL LOW (ref 20–55)
TIBC: 189 ug/dL — ABNORMAL LOW (ref 215–435)
UIBC: 174 ug/dL (ref 125–400)

## 2014-05-02 LAB — BASIC METABOLIC PANEL
Anion gap: 6 (ref 5–15)
BUN: 21 mg/dL (ref 6–23)
CHLORIDE: 109 mmol/L (ref 96–112)
CO2: 25 mmol/L (ref 19–32)
Calcium: 8.8 mg/dL (ref 8.4–10.5)
Creatinine, Ser: 0.77 mg/dL (ref 0.50–1.35)
GFR calc Af Amer: >90 mL/min (ref >90)
GFR calc non Af Amer: 82 mL/min — ABNORMAL LOW (ref >90)
GLUCOSE: 86 mg/dL (ref 70–99)
Potassium: 3.6 mmol/L (ref 3.5–5.1)
Sodium: 140 mmol/L (ref 135–145)

## 2014-05-02 LAB — CBC
HCT: 29.6 % — ABNORMAL LOW (ref 39.0–52.0)
HEMOGLOBIN: 9.3 g/dL — AB (ref 13.0–17.0)
MCH: 27.4 pg (ref 26.0–34.0)
MCHC: 31.4 g/dL (ref 30.0–36.0)
MCV: 87.1 fL (ref 78.0–100.0)
Platelets: 128 10*3/uL — ABNORMAL LOW (ref 150–400)
RBC: 3.4 MIL/uL — ABNORMAL LOW (ref 4.22–5.81)
RDW: 17.5 % — AB (ref 11.5–15.5)
WBC: 4 10*3/uL (ref 4.0–10.5)

## 2014-05-02 LAB — VITAMIN B12: Vitamin B-12: 482 pg/mL (ref 211–911)

## 2014-05-02 LAB — FOLATE: Folate: 14.5 ng/mL

## 2014-05-02 LAB — HAPTOGLOBIN: HAPTOGLOBIN: 255 mg/dL — AB (ref 34–200)

## 2014-05-02 LAB — FERRITIN: FERRITIN: 163 ng/mL (ref 22–322)

## 2014-05-02 SURGERY — ECHOCARDIOGRAM, TRANSESOPHAGEAL
Anesthesia: Moderate Sedation

## 2014-05-02 MED ORDER — FENTANYL CITRATE 0.05 MG/ML IJ SOLN
INTRAMUSCULAR | Status: DC | PRN
  Administered 2014-05-02: 25 ug via INTRAVENOUS

## 2014-05-02 MED ORDER — DIPHENHYDRAMINE HCL 50 MG/ML IJ SOLN
INTRAMUSCULAR | Status: AC
  Filled 2014-05-02: qty 1

## 2014-05-02 MED ORDER — FENTANYL CITRATE 0.05 MG/ML IJ SOLN
INTRAMUSCULAR | Status: AC
  Filled 2014-05-02: qty 2

## 2014-05-02 MED ORDER — MIDAZOLAM HCL 5 MG/ML IJ SOLN
INTRAMUSCULAR | Status: AC
  Filled 2014-05-02: qty 2

## 2014-05-02 MED ORDER — BUTAMBEN-TETRACAINE-BENZOCAINE 2-2-14 % EX AERO
INHALATION_SPRAY | CUTANEOUS | Status: DC | PRN
  Administered 2014-05-02: 2 via TOPICAL

## 2014-05-02 MED ORDER — SODIUM CHLORIDE 0.9 % IV SOLN
1020.0000 mg | Freq: Once | INTRAVENOUS | Status: AC
  Administered 2014-05-03: 1020 mg via INTRAVENOUS
  Filled 2014-05-02: qty 34

## 2014-05-02 MED ORDER — MIDAZOLAM HCL 10 MG/2ML IJ SOLN
INTRAMUSCULAR | Status: DC | PRN
  Administered 2014-05-02: 1 mg via INTRAVENOUS

## 2014-05-02 MED ORDER — POTASSIUM CHLORIDE CRYS ER 20 MEQ PO TBCR
40.0000 meq | EXTENDED_RELEASE_TABLET | Freq: Once | ORAL | Status: AC
  Administered 2014-05-02: 40 meq via ORAL
  Filled 2014-05-02: qty 2

## 2014-05-02 MED ORDER — ENOXAPARIN SODIUM 40 MG/0.4ML ~~LOC~~ SOLN
40.0000 mg | SUBCUTANEOUS | Status: DC
  Administered 2014-05-03 – 2014-05-07 (×5): 40 mg via SUBCUTANEOUS
  Filled 2014-05-02 (×5): qty 0.4

## 2014-05-02 NOTE — Progress Notes (Signed)
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 Feeling ok after TEE  CURRENT MEDS . antiseptic oral rinse  7 mL Mouth Rinse BID  . aztreonam  2 g Intravenous Q8H  . enoxaparin (LOVENOX) injection  100 mg Subcutaneous Q12H  . feeding supplement (JEVITY 1.2 CAL)  1,000 mL Per Tube Q24H  . feeding supplement (PRO-STAT SUGAR FREE 64)  30 mL Oral TID WC  . lactulose  10 g Oral Daily  . magnesium oxide  200 mg Oral Daily  . methylphenidate  5 mg Oral Daily  . mirtazapine  15 mg Oral QHS  . simvastatin  20 mg Oral Daily  . vancomycin  1,250 mg Intravenous Q24H    OBJECTIVE  Filed Vitals:   05/01/14 1950 05/02/14 0500 05/02/14 0612 05/02/14 0732  BP: 137/72 114/88  140/84  Pulse: 81 52  80  Temp: 98.7 F (37.1 C) 97.9 F (36.6 C)  98.3 F (36.8 C)  TempSrc: Oral Oral  Oral  Resp: 18 18  23   Height:      Weight:  210 lb 5.1 oz (95.4 kg) 214 lb 11.7 oz (97.4 kg)   SpO2: 100% 96%  96%    Intake/Output Summary (Last 24 hours) at 05/02/14 0805 Last data filed at 05/02/14 0737  Gross per 24 hour  Intake   1255 ml  Output   1575 ml  Net   -320 ml   Filed Weights   05/01/14 0500 05/02/14 0500 05/02/14 0612  Weight: 211 lb 13.8 oz (96.1 kg) 210 lb 5.1 oz (95.4 kg) 214 lb 11.7 oz (97.4 kg)    PHYSICAL  EXAM  General: Pleasant, NAD. Neuro: Alert and oriented X 3. Moves all extremities spontaneously. Psych: Normal affect. HEENT:  Normal  Neck: Supple without bruits or JVD. Lungs:  Resp regular and unlabored, CTA. Heart: RRR no s3, s4, or murmurs. Abdomen: Soft, non-tender, non-distended, BS + x 4.  Extremities: No clubbing, cyanosis or edema. DP/PT/Radials 2+ and equal bilaterally.  Accessory Clinical Findings  CBC  Recent Labs  04/29/14 1107 04/30/14 0900 05/01/14 0340 05/02/14 0540  WBC 5.5 4.1 3.6* 4.0  NEUTROABS 4.2 3.1  --   --   HGB 10.3* 8.1* 8.8* 9.3*  HCT 30.7* 25.1* 27.7* 29.6*  MCV 82.5 84.8 84.7 87.1  PLT 114* 88* 97* 119*   Basic Metabolic Panel  Recent Labs  04/29/14 1107 04/30/14 0900 05/02/14 0540  NA 134* 136 140  K 4.1 3.6 3.6  CL 103 112 109  CO2 25 22 25   GLUCOSE 121* 89 86  BUN 21 22 21   CREATININE 1.16 0.93 0.77  CALCIUM 9.1 8.1* 8.8  MG 1.7 1.8  --    Liver Function Tests  Recent Labs  04/29/14 1107 04/30/14 0900  AST 26 19  ALT 12 11  ALKPHOS 57 39  BILITOT 0.4 0.4  PROT 6.1 5.0*  ALBUMIN 2.4* 2.0*   No results for input(s): LIPASE, AMYLASE in the last 72 hours. Cardiac Enzymes  Recent Labs  04/30/14 0900 04/30/14 1401 04/30/14 2005  TROPONINI <0.03 <0.03 <0.03   BNP Invalid input(s): POCBNP D-Dimer  Recent Labs  05/01/14 1830  DDIMER 9.95*    TELE NSR with HR 70s, occasional bigeminy    ECG  No new EKG  Echocardiogram 04/10/2014 Pending TEE today  - Left ventricle: The cavity size was normal. Systolic function was moderately reduced. The estimated ejection fraction was in the range of 35% to 40%. Diffuse hypokinesis. - Aortic valve: The AV appears visually to be at least moderately stenotic. There was no AVA calculated. The mean AV gradient and peak AV velocity are in the mild AS range but these may be underestimated due to reduced LVF. Severely calcified annulus. Trileaflet. Moderate  thickening and calcification. Valve mobility was restricted. There was mild stenosis. - Mitral valve: Calcified annulus. Mild thickening and calcification. There was mild regurgitation. - Left atrium: The atrium was mildly to moderately dilated.    Radiology/Studies  Dg Chest Port 1 View  04/29/2014   CLINICAL DATA:  Elevated temperature.  Sepsis.  Hypertension  EXAM: PORTABLE CHEST - 1 VIEW  COMPARISON:  04/19/2014  FINDINGS: Elevated left hemidiaphragm. Mild bilateral interstitial prominence. Hazy left lower lobe airspace disease. No pleural effusion or pneumothorax. Stable cardiomediastinal silhouette. Descending thoracic aortic stent graft. No acute osseous abnormality.  IMPRESSION: Cardiomegaly with mild CHF. Hazy left lower lobe airspace disease which may reflect atelectasis versus pneumonia.   Electronically Signed   By: Kathreen Devoid   On: 04/29/2014 11:26   Dg Chest Port 1 View  04/19/2014   CLINICAL DATA:  Pulmonary infiltrates.  EXAM: PORTABLE CHEST - 1 VIEW  COMPARISON:  04/18/2014.  FINDINGS: Persistent severe cardiomegaly with bilateral pulmonary infiltrates and right pleural effusion consistent with congestive heart failure appear underlying basilar pneumonia cannot be excluded. Aortic stent graft in stable position. No pneumothorax.  IMPRESSION: 1. Persistent severe cardiomegaly bilateral pulmonary alveolar infiltrates and small right pleural effusion consistent with congestive heart failure bibasilar pneumonia cannot be excluded. 2. Aortic stent graft in stable position.   Electronically Signed   By: Marcello Moores  Register   On: 04/19/2014 07:26   Dg Chest Port 1 View  04/18/2014   CLINICAL DATA:  Bilateral airspace disease, history of thoracic aortic aneurysm treated with stent graft  EXAM: PORTABLE CHEST - 1 VIEW  COMPARISON:  Portable chest x-ray of April 17, 2014  FINDINGS: The lungs are better inflated today. The interstitial markings remain increased. The pulmonary vascularity  remains engorged. The cardiopericardial silhouette remains enlarged. There are small bilateral pleural effusions layering posteriorly. The bony thorax is unremarkable.  IMPRESSION: Persistent findings of congestive heart failure with pulmonary interstitial edema and small bilateral pleural effusions. Bibasilar subsegmental atelectasis or pneumonia is unchanged.   Electronically Signed   By: David  Martinique   On: 04/18/2014 07:31   Dg Chest Port 1 View  04/17/2014   CLINICAL DATA:  Fever.  EXAM: PORTABLE CHEST - 1 VIEW  COMPARISON:  04/09/2014  FINDINGS: Cardiomediastinal contours with aortic stent graft in place, unchanged from prior exam. Mildly improved right basilar opacity with residual atelectasis. Unchanged atelectasis at the left lung base. No large pleural effusion or pneumothorax. Osseous structures are stable.  IMPRESSION: Improved right basilar aeration. No new  consolidation to suggest progressive or new pneumonia.   Electronically Signed   By: Jeb Levering M.D.   On: 04/17/2014 04:15   Dg Chest Port 1 View  04/09/2014   CLINICAL DATA:  Altered mental status  EXAM: PORTABLE CHEST - 1 VIEW  COMPARISON:  Portable exam 1801 hr compared to 03/24/2014  FINDINGS: Atherosclerotic calcification aorta with prior aortic stenting.  Stable heart size and mediastinal contours.  Increased LEFT basilar atelectasis versus infiltrate.  Mild persistent opacity in RIGHT lower lobe little changed.  Upper lungs clear.  No gross pleural effusion or pneumothorax.  IMPRESSION: Persistent RIGHT basilar opacity with increased atelectasis versus infiltrate in LEFT lower lobe.   Electronically Signed   By: Lavonia Dana M.D.   On: 04/09/2014 18:16   Ct Angio Chest Aortic Dissect W &/or W/o  04/29/2014   CLINICAL DATA:  Thoracoabdominal aortic aneurysm post stenting, recurrent graft infection, fever, hypotension, generalized abdominal pain, sepsis  EXAM: CT ANGIOGRAPHY CHEST, ABDOMEN AND PELVIS  CTA ABDOMEN AND PELVIS WITHOUT  AND WITH CONTRAST  TECHNIQUE: Multidetector CT imaging through the chest, abdomen and pelvis was performed using the standard protocol during bolus administration of intravenous contrast. Multiplanar reconstructed images and MIPs were obtained and reviewed to evaluate the vascular anatomy.  CONTRAST:  130mL OMNIPAQUE IOHEXOL 350 MG/ML SOLN IV  COMPARISON:  02/26/2014  FINDINGS: CTA CHEST FINDINGS  Extensive atherosclerotic calcifications aorta, coronary arteries and proximal great vessels.  Endograft identified beginning at proximal descending thoracic aorta extending into abdomen.  No high attenuation mural crescent identified on precontrast imaging.  Descending thoracic aorta stable in size.  Aneurysmal dilatation of the ascending thoracic aorta measuring 4.7 cm transverse immediately proximal to aortic arch, 4.5 cm on previous exam.  Minimal low-attenuation pericardial effusion.  No evidence of dissection or periaortic hemorrhage.  Scattered atelectasis greater in lower lobes and lateral RIGHT upper lobe.  Small BILATERAL pleural effusions.  Minimal patchy infiltrate in lingula.  No thoracic adenopathy or pneumothorax.  Bones demineralized.  Review of the MIP images confirms the above findings.  CTA ABDOMEN AND PELVIS FINDINGS  Aortic endograft extends from distal aspect of aortic arch through abdominal aorta and aortic bifurcation into the LEFT common iliac artery and RIGHT internal and external iliac arteries.  Aneurysmal dilatation of the proximal abdominal aorta beginning below the diaphragmatic hiatus and extending to the SMA origin, 6.9 x 6.4 cm image 121 previously 6.9 x 6.1 cm.  Second fusiform focus of aortic dilatation at distal abdominal aorta at bifurcation, 4.1 x 4.6 cm image 200, previously 4.4 x 3.9 cm.  Third area of aneurysmal dilatation located at RIGHT common iliac artery, 4.3 cm diameter image 223, previously 4.4 cm.  Several small foci of calcification are seen within the aneurysm thrombus  without definite evidence of endoleak or acute perianeurysmal hemorrhage.  Infiltration of periaortic fat anterior to the proximal abdominal aortic aneurysm at the level of the celiac artery appears unchanged, question related to prior aneurysm rupture/hemorrhage, perianeurysmal fibrosis or infection.  Small pseudoaneurysm identified at the celiac stent on 01/03/2014 exam is not visualized on the current exam nor on the previous study of 02/26/2014 due to interval intervention.  No discrete fluid collection to suggest abscess is identified.  LEFT renal cyst stable.  Liver, spleen, atrophic pancreas, kidneys, and adrenal glands otherwise unremarkable.  Gastrostomy tube present.  Bowel loops suboptimally assessed due to lack of GI contrast, no gross abnormality seen.  No additional mass, adenopathy, free fluid, or free air.  IVC filter noted.  Degenerative disc and facet disease changes thoracolumbar spine.  Review of the MIP images confirms the above findings.  IMPRESSION:  Post stenting of thoracoabdominal aorta through aortic bifurcation into the iliac systems bilaterally farther on RIGHT.  Additional stenting of the celiac, SMA and renal arteries.  Areas of aortic dilatation appear fairly stable in sizes since previous exam.  Perianeurysmal infiltration centered at the level of the celiac artery appears little changed from the previous exam favor related to prior hemorrhage and/or fibrosis, less likely infection.  No discrete abscess collection is identified though graft infection cannot be excluded by CT.  Scattered mild pulmonary atelectasis and small pleural effusions as above.   Electronically Signed   By: Lavonia Dana M.D.   On: 04/29/2014 15:25   Ct Cta Abd/pel W/cm &/or W/o Cm  04/29/2014   CLINICAL DATA:  Thoracoabdominal aortic aneurysm post stenting, recurrent graft infection, fever, hypotension, generalized abdominal pain, sepsis  EXAM: CT ANGIOGRAPHY CHEST, ABDOMEN AND PELVIS  CTA ABDOMEN AND PELVIS  WITHOUT AND WITH CONTRAST  TECHNIQUE: Multidetector CT imaging through the chest, abdomen and pelvis was performed using the standard protocol during bolus administration of intravenous contrast. Multiplanar reconstructed images and MIPs were obtained and reviewed to evaluate the vascular anatomy.  CONTRAST:  169mL OMNIPAQUE IOHEXOL 350 MG/ML SOLN IV  COMPARISON:  02/26/2014  FINDINGS: CTA CHEST FINDINGS  Extensive atherosclerotic calcifications aorta, coronary arteries and proximal great vessels.  Endograft identified beginning at proximal descending thoracic aorta extending into abdomen.  No high attenuation mural crescent identified on precontrast imaging.  Descending thoracic aorta stable in size.  Aneurysmal dilatation of the ascending thoracic aorta measuring 4.7 cm transverse immediately proximal to aortic arch, 4.5 cm on previous exam.  Minimal low-attenuation pericardial effusion.  No evidence of dissection or periaortic hemorrhage.  Scattered atelectasis greater in lower lobes and lateral RIGHT upper lobe.  Small BILATERAL pleural effusions.  Minimal patchy infiltrate in lingula.  No thoracic adenopathy or pneumothorax.  Bones demineralized.  Review of the MIP images confirms the above findings.  CTA ABDOMEN AND PELVIS FINDINGS  Aortic endograft extends from distal aspect of aortic arch through abdominal aorta and aortic bifurcation into the LEFT common iliac artery and RIGHT internal and external iliac arteries.  Aneurysmal dilatation of the proximal abdominal aorta beginning below the diaphragmatic hiatus and extending to the SMA origin, 6.9 x 6.4 cm image 121 previously 6.9 x 6.1 cm.  Second fusiform focus of aortic dilatation at distal abdominal aorta at bifurcation, 4.1 x 4.6 cm image 200, previously 4.4 x 3.9 cm.  Third area of aneurysmal dilatation located at RIGHT common iliac artery, 4.3 cm diameter image 223, previously 4.4 cm.  Several small foci of calcification are seen within the aneurysm  thrombus without definite evidence of endoleak or acute perianeurysmal hemorrhage.  Infiltration of periaortic fat anterior to the proximal abdominal aortic aneurysm at the level of the celiac artery appears unchanged, question related to prior aneurysm rupture/hemorrhage, perianeurysmal fibrosis or infection.  Small pseudoaneurysm identified at the celiac stent on 01/03/2014 exam is not visualized on the current exam nor on the previous study of 02/26/2014 due to interval intervention.  No discrete fluid collection to suggest abscess is identified.  LEFT renal cyst stable.  Liver, spleen, atrophic pancreas, kidneys, and adrenal glands otherwise unremarkable.  Gastrostomy tube present.  Bowel loops suboptimally assessed due to lack of GI contrast, no gross abnormality seen.  No additional mass, adenopathy, free fluid, or free  air.  IVC filter noted.  Degenerative disc and facet disease changes thoracolumbar spine.  Review of the MIP images confirms the above findings.  IMPRESSION:  Post stenting of thoracoabdominal aorta through aortic bifurcation into the iliac systems bilaterally farther on RIGHT.  Additional stenting of the celiac, SMA and renal arteries.  Areas of aortic dilatation appear fairly stable in sizes since previous exam.  Perianeurysmal infiltration centered at the level of the celiac artery appears little changed from the previous exam favor related to prior hemorrhage and/or fibrosis, less likely infection.  No discrete abscess collection is identified though graft infection cannot be excluded by CT.  Scattered mild pulmonary atelectasis and small pleural effusions as above.   Electronically Signed   By: Lavonia Dana M.D.   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   EXAM: CTA ABDOMEN AND PELVIS WITHOUT CONTRAST  TECHNIQUE: Multidetector CT imaging of the abdomen and pelvis was performed using the standard protocol during bolus administration of intravenous contrast. Multiplanar reconstructed images and MIPs were obtained and reviewed to evaluate the vascular anatomy.  CONTRAST:  136mL OMNIPAQUE IOHEXOL 350 MG/ML SOLN  COMPARISON:  CTA of the chest, abdomen and pelvis on 02/26/2014 and multiple other imaging studies.  FINDINGS: Complex thoracoabdominal demonstrated endograft placement again noted. Only the lower portion of the descending thoracic component is visualized on the study. The endograft shows stable patency and positioning. Fenestration with stenting of the celiac axis, superior mesenteric artery, bilateral single renal arteries and graft extensions into the right internal and external iliac arteries show normal patency.  Stable soft tissue thickening remains around the celiac axis with stent extension into the common hepatic artery. There no longer is contrast opacification of a separate pseudoaneurysm as depicted by CTA October, 2015. No new pseudoaneurysms identified. No evidence of abnormal air surrounding the endograft. No abnormal fluid collections.  On the arterial phase of imaging, no obvious endoleak is identified. A delayed sequence was not obtained on the current study. Native right common iliac aneurysm sac remains thrombosed. Distal left common iliac limb and both right internal and external iliac limb landing zones show normal patency and apposition. Native iliac and common femoral arteries showed no significant disease.  The visualized lung bases show small bilateral pleural effusions and bibasilar atelectasis. The liver, gallbladder, spleen and adrenal glands are unremarkable. There is some motion artifact on the study and the arms were also along the side of the body. There may be some subtle stranding in the fat adjacent to the tail of the pancreas.  Correlation suggested with any clinical evidence of acute pancreatitis.  Bowel is unremarkable with no evidence of obstruction or thickening. No free air is identified. There is a gastrostomy tube positioned in the body of the stomach. An IVC filter is in place in the infrarenal IVC. The bladder is unremarkable. No hernias are identified. Bony structures show mild spondylosis of the lower thoracic and lumbar spine.  Review of the MIP images confirms the above findings.  IMPRESSION: 1. No further opacification of celiac axis pseudoaneurysm following extend extension into the common hepatic artery. There remains soft tissue thickening around the celiac axis at the level of prior mycotic pseudoaneurysm. No abnormal fluid collection is identified. 2. Stable patency of complex fenestrated thoracoabdominal endograft extending  into native iliac arteries bilaterally. 3. Small bilateral pleural effusions. 4. Motion artifact versus potential subtle inflammatory stranding adjacent to the tail of the pancreas. Recommend correlation with any clinical evidence of pancreatitis.   Electronically Signed   By: Aletta Edouard M.D.   On: 04/19/2014 14:00    ASSESSMENT AND PLAN  1. Recurrent sepsis  - s/p TEE today, no vegetation noted. Pending full report. Abx per primary team. Significant improvement.   2. S/p thoracoabdominal aortic aneurysm repair at [facility] with subsequent graft infection causing chronic endovascular leak  3. At least Moderate aortic stenosis  4. Prolonged QT on EKG: prior Cipro stopped  - only QTc prolonging drug he is still taking is Remeron, ?if has better alternative.   5. Previous PEG tube but now on a dysphagia 3 diet. Patient denies difficulty swallowing. 6. Chronic systolic congestive heart failure, clinically improved after Lasix in admission 7. Anemia and thrombocytopenia: followed by oncology, felt related to recurrent infection. Lovenox stopped due to risk of bleeding.  Improving 8. Elevated d-dimer: per primary team  Signed, Almyra Deforest PA-C Pager: [phone]   The patient was seen, examined and discussed with Almyra Deforest, PA-C and I agree with the above.   NO ECG since yesterday, we will order to check for QT/QTc length. Continue to avoid any drugs which would prolong the QT interval. The patient is on schedule for TEE today. We will follow.   Dorothy Spark 05/02/2014

## 2014-05-02 NOTE — Progress Notes (Addendum)
Spoke with daughter [name]. They do not wish to meet with me tomorrow. They have talked to SW and are working on home with home health and PT.  They are not interested in hospice and hospice wouldn't be able to provide PT in general and really their goal isn't to maximize time at home and will bring him back to hospital if issues occur.  They acknowledge they likely will and has a high risk of re-hospitalization in next 30 days. Family plans to provide 24h care at home.  Will continue to follow peripherally.  No charge   Doran Clay D.O. Palliative Medicine Team at [facility]  Pager: [phone] Team Phone: [phone]

## 2014-05-02 NOTE — Clinical Social Work Note (Signed)
CSW spoke with patients daughter, [name] and Helene Kelp, concerning patient disposition at time of DC.  Pt daughters are hoping to take pt home since that is his wish.  They state that they would like to continue rehab at this time at home because patient is wanted to try and recover.  Patients family became tearful discussing patient care and only want to do what is best for the patient- they would like hospital bed at home, PT/OT, RN visits if possible, and list of private duty home health services.  CSW informed RNCM.  Domenica Reamer, [place] Social Worker [phone]

## 2014-05-02 NOTE — Progress Notes (Signed)
Echocardiogram Echocardiogram Transesophageal has been performed.  [name], [name] 05/02/2014, 9:53 AM

## 2014-05-02 NOTE — Consult Note (Signed)
[name]   DOB:[birth year]   UY#:[id number]   DGL#:[id number]  Patient Care Team: [name], MD as PCP - General (Internal Medicine) Ephraim Hamburger, [place] as Speech Language Pathologist (Speech Pathology) Danie Binder, MD as Consulting Physician (Gastroenterology)  Subjective: Patient seen and examined. Afebrile. Lying at bedside, no acute distress, tired from recent TEE procedure. Denies shortness of breath, mild non-productive cough, no chest pain, denies palpitations. Denies nausea or vomiting. Denies abdominal pain. No other new issues overnight.    Scheduled Meds: . antiseptic oral rinse  7 mL Mouth Rinse BID  . aztreonam  2 g Intravenous Q8H  . enoxaparin (LOVENOX) injection  100 mg Subcutaneous Q12H  . feeding supplement (JEVITY 1.2 CAL)  1,000 mL Per Tube Q24H  . feeding supplement (PRO-STAT SUGAR FREE 64)  30 mL Oral TID WC  . lactulose  10 g Oral Daily  . magnesium oxide  200 mg Oral Daily  . methylphenidate  5 mg Oral Daily  . mirtazapine  15 mg Oral QHS  . simvastatin  20 mg Oral Daily  . vancomycin  1,250 mg Intravenous Q24H   Continuous Infusions:  PRN Meds:acetaminophen, albuterol, bisacodyl, magnesium hydroxide, sodium phosphate   Objective:  Filed Vitals:   05/02/14 0850  BP: 124/50  Pulse: 75  Temp:   Resp: 17      Intake/Output Summary (Last 24 hours) at 05/02/14 0941 Last data filed at 05/02/14 0800  Gross per 24 hour  Intake   1255 ml  Output   1575 ml  Net   -320 ml   GENERAL:alert, no distress and comfortable. He is very hard of hearing SKIN: skin color, texture, turgor are normal, no rashes or significant lesions EYES: normal, conjunctiva are pink and non-injected, sclera clear OROPHARYNX:no exudate, no erythema and lips, buccal mucosa, and tongue normal  NECK: supple, thyroid normal size, non-tender, without nodularity LYMPH:  no palpable lymphadenopathy in the cervical, axillary or inguinal LUNGS: decreased breath sounds at the  left base, rhonchi  And some wheezing noted. HEART: regular rate & rhythm and 1/6 systolic murmurs and 1+ lower extremity edema ABDOMEN: soft, non-tender and normal bowel sounds. PEG tube present. Musculoskeletal:no cyanosis of digits and no clubbing  PSYCH: alert & oriented x 3 with fluent speech NEURO: no focal motor/sensory deficits    CBG (last 3)  No results for input(s): GLUCAP in the last 72 hours.   Labs:   Recent Labs Lab 04/29/14 1107 04/30/14 0900 05/01/14 0340 05/02/14 0540  WBC 5.5 4.1 3.6* 4.0  HGB 10.3* 8.1* 8.8* 9.3*  HCT 30.7* 25.1* 27.7* 29.6*  PLT 114* 88* 97* 128*  MCV 82.5 84.8 84.7 87.1  MCH 27.7 27.4 26.9 27.4  MCHC 33.6 32.3 31.8 31.4  RDW 17.1* 17.6* 17.5* 17.5*  LYMPHSABS 0.4* 0.5*  --   --   MONOABS 0.8 0.4  --   --   EOSABS 0.0 0.1  --   --   BASOSABS 0.0 0.0  --   --      Chemistries:    Recent Labs Lab 04/29/14 1107 04/30/14 0900 05/02/14 0540  NA 134* 136 140  K 4.1 3.6 3.6  CL 103 112 109  CO2 25 22 25   GLUCOSE 121* 89 86  BUN 21 22 21   CREATININE 1.16 0.93 0.77  CALCIUM 9.1 8.1* 8.8  MG 1.7 1.8  --   AST 26 19  --   ALT 12 11  --   ALKPHOS 57 39  --  BILITOT 0.4 0.4  --     GFR Estimated Creatinine Clearance: 82.8 mL/min (by C-G formula based on Cr of 0.77).  Liver Function Tests:  Recent Labs Lab 04/29/14 1107 04/30/14 0900  AST 26 19  ALT 12 11  ALKPHOS 57 39  BILITOT 0.4 0.4  PROT 6.1 5.0*  ALBUMIN 2.4* 2.0*   No results for input(s): LIPASE, AMYLASE in the last 168 hours. No results for input(s): AMMONIA in the last 168 hours.  Urine Studies     Component Value Date/Time   COLORURINE YELLOW 04/29/2014 1151   APPEARANCEUR CLOUDY* 04/29/2014 1151   LABSPEC 1.022 04/29/2014 1151   PHURINE 5.0 04/29/2014 1151   GLUCOSEU NEGATIVE 04/29/2014 1151   HGBUR MODERATE* 04/29/2014 1151   BILIRUBINUR NEGATIVE 04/29/2014 1151   KETONESUR 15* 04/29/2014 1151   PROTEINUR 30* 04/29/2014 1151   UROBILINOGEN  1.0 04/29/2014 1151   NITRITE NEGATIVE 04/29/2014 1151   LEUKOCYTESUR SMALL* 04/29/2014 1151    Coagulation profile  Recent Labs Lab 05/01/14 1830  INR 1.25    Cardiac Enzymes:  Recent Labs Lab 04/30/14 0900 04/30/14 1401 04/30/14 2005  TROPONINI <0.03 <0.03 <0.03   Microbiology Urine is negative, blood culture pending.   Imaging Studies:  No results found.  Assessment/Plan: 81 y.o.  Sepsis This is likely secondary to HCAP, although urine has positive leukocytes sterease CT angio of the chest is non-diagnostic for infectious source. He is on Vanco and aztreonam; High dose Cipro discontinued due to QTC prolongation for which he underwent TEE 2/18, results pending Urine cultures is negative, urine strep and legionella antigen negative, blood culture is pending.   History of DVT left lower extremity  s/p - IVC filter placed 05/06/2013.  He was on Xarelto, unclear when this was discontinued He is currently on Lovenox, his platelet counts are now 128,000. Lovenox can be placed on hold if platelet count is less than 50,000, or the patient is acutely bleeding Can transfuse platelets if they are less than 10,000,Were 20,000 if the patient is bleeding Dr. Marin Olp to see patient today  Anemia Thrombocytopenia His anemia and thrombocytopenia is likely related to his recurrent infections, but will rule out DIC and Iron deficiency Labs are as follows:   history of low fibrinogen, possible DIC. Check new fibrinogen levels D dimer as of 2/17 was 9.95. PT 15.9 and INR 1.25, PTT 52 Monitor counts closely Transfuse blood to maintain a Hb of 8 g or if the patient is acutely bleeding Retic on 2/17 was 53.6, hapto 255, check LDH Ferritin 163, Iron at 15, TIBC 189, percent sat 8, folate 14.5     Other medical issues Including congestive heart failure, pneumonia, Prolonged QTC, malnutrition, AAA, chronic dysphagia as per admitting team     **Disclaimer: This note was dictated  with voice recognition software. Similar sounding words can inadvertently be transcribed and this note may contain transcription errors which may not have been corrected upon publication of note.** Rondel Jumbo, PA-C 05/02/2014  9:41 AM   ADDENDUM:  Mr. Studer is well-known to me. He has the history of the left lower extremity thrombus. He presented with this back in February 2015. He is on Xarelto. He has been on anticoagulation now for one year. I think we can probably stop his anticoagulation. He had a Doppler done back in October of last year. It probably would not be a bad idea to repeat this.  I am absolutely shocked that he has a feeding tube in. Not sure  why he has a feeding tube in. He says that he just is not hungry.  He is getting tube feeds overnight. Is possible he may not be hungry because he is full in the morning.  I looked at his blood smear. I think he has some degree of iron deficiency. I think his thrombocytopenia is from his gram-negative rod infection. I think with antibiotic therapy, this will improve.  Again, I'm totally shocked that he has a feeding tube in.  I have not see him in a while. Apparently, he has not been home because he been hospitalized with pneumonia and infection. He has this abdominal aortic aneurysm that might be infected. This might be where his infections are coming from. He was supposed to have this taken care of at [facility] but he declined at the last moment to have any intervention.  I don't see any problems with him being on prophylactic anticoagulation. He does have the IVC filter in so we don't have to really worry about any type of thromboembolic disease trauma into his lungs.  It is clearly his appetite and nutritional state that his most important right now.  If this aneurysm is the source of infection, I would have to think that this is going to have to be somehow removed or repaired.  I appreciate being let known that he is in the  hospital.  Lum Keas

## 2014-05-02 NOTE — H&P (View-Only) (Signed)
Subjective:  Awake and alert, flat in bed- no SOB  Objective:  Vital Signs in the last 24 hours: Temp:  [97.4 F (36.3 C)-98.2 F (36.8 C)] 98 F (36.7 C) (02/17 0417) Pulse Rate:  [69-122] 88 (02/17 0748) Resp:  [17-27] 20 (02/17 0748) BP: (90-133)/(49-68) 97/49 mmHg (02/17 0748) SpO2:  [91 %-100 %] 95 % (02/17 0748) Weight:  [211 lb 13.8 oz (96.1 kg)] 211 lb 13.8 oz (96.1 kg) (02/17 0500)  Intake/Output from previous day:  Intake/Output Summary (Last 24 hours) at 05/01/14 0932 Last data filed at 05/01/14 0748  Gross per 24 hour  Intake   2352 ml  Output   3926 ml  Net  -1574 ml    Physical Exam: General appearance: alert, cooperative and no distress Lungs: clear to auscultation bilaterally Heart: regular rate and rhythm and 2/6 systolic murmur   Rate: 86  Rhythm: normal sinus rhythm and runs of tachycardia and PVCs.   Lab Results:  Recent Labs  04/30/14 0900 05/01/14 0340  WBC 4.1 3.6*  HGB 8.1* 8.8*  PLT 88* 97*    Recent Labs  04/29/14 1107 04/30/14 0900  NA 134* 136  K 4.1 3.6  CL 103 112  CO2 25 22  GLUCOSE 121* 89  BUN 21 22  CREATININE 1.16 0.93    Recent Labs  04/30/14 1401 04/30/14 2005  TROPONINI <0.03 <0.03   No results for input(s): INR in the last 72 hours.  Imaging: Imaging results have been reviewed  Cardiac Studies:  Assessment/Plan:  81 year old gentleman is status post repair of abdominal aortic aneurysm in 2014. 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 He has been treated with prolonged antibiotics. He has a history of Pseudomonas bacteremia. He was most recently treated with outpatient high-dose oral ciprofloxacin 750 mg twice a day. This has subsequently been stopped because of prolonged QTc interval. He was admitted with fever 04/29/14.   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 He is on the schedule for TEE tomorrow at 9am. Orders written. Check EKG/QTc this am.    [facility] has been requested to perform a transesophageal echocardiogram on 05/02/14 for fever.  After careful review of history and examination, the risks and benefits of transesophageal echocardiogram have been explained including risks of esophageal damage, perforation (1:10,000 risk), bleeding, pharyngeal hematoma as well as other potential complications associated with conscious sedation including aspiration, arrhythmia, respiratory failure and death. Alternatives to treatment were discussed, questions were answered. Patient is willing to proceed.    Kerin Ransom PA-C Beeper [phone] 05/01/2014, 9:32 AM   The patient was seen, examined and discussed with Kerin Ransom, PA-C and I agree with the above.   ECG shows persistent QT prolongation, with minimal improvement yesterday QT/QTc 554/610 , today 486/578 ms. Continue  to avoid any drugs which would prolong the QT interval. The patient is on schedule for TEE tomorrow. We will follow.   Dorothy Spark 05/01/2014

## 2014-05-02 NOTE — Progress Notes (Signed)
ANTIBIOTIC CONSULT NOTE - FOLLOW UP  Pharmacy Consult for aztreonam and vancomycin; lovenox  Indication: PNA;  Hx of DVT  Allergies  Allergen Reactions  . Atorvastatin Other (See Comments)    Causes constipation  . Penicillins Other (See Comments)    Other reaction(s): RASH HIVES     Patient Measurements: Height: 6\' 2"  (188 cm) Weight: 214 lb 11.7 oz (97.4 kg) IBW/kg (Calculated) : 82.2  Vital Signs: Temp: 97.6 F (36.4 C) (02/18 0841) Temp Source: Oral (02/18 0841) BP: 124/50 mmHg (02/18 0850) Pulse Rate: 75 (02/18 0850) Intake/Output from previous day: 02/17 0701 - 02/18 0700 In: 3474 [P.O.:360; NG/GT:725; IV Piggyback:400] Out: 1475 [Urine:1475] Intake/Output from this shift: Total I/O In: 0  Out: 300 [Urine:300]  Labs:  Recent Labs  04/30/14 0900 05/01/14 0340 05/02/14 0540  WBC 4.1 3.6* 4.0  HGB 8.1* 8.8* 9.3*  PLT 88* 97* 128*  CREATININE 0.93  --  0.77   Estimated Creatinine Clearance: 82.8 mL/min (by C-G formula based on Cr of 0.77). No results for input(s): VANCOTROUGH, VANCOPEAK, VANCORANDOM, GENTTROUGH, GENTPEAK, GENTRANDOM, TOBRATROUGH, TOBRAPEAK, TOBRARND, AMIKACINPEAK, AMIKACINTROU, AMIKACIN in the last 72 hours.   Microbiology: Recent Results (from the past 720 hour(s))  Urine culture     Status: None   Collection Time: 04/09/14  6:18 PM  Result Value Ref Range Status   Specimen Description URINE, CLEAN CATCH  Final   Special Requests NONE  Final   Colony Count NO GROWTH Performed at [facility]   Final   Culture NO GROWTH Performed at [facility]   Final   Report Status 04/11/2014 FINAL  Final  Clostridium Difficile by PCR     Status: None   Collection Time: 04/09/14  6:18 PM  Result Value Ref Range Status   C difficile by pcr NEGATIVE NEGATIVE Final  Stool culture     Status: None   Collection Time: 04/09/14  6:18 PM  Result Value Ref Range Status   Specimen Description STOOL  Final   Special Requests NONE   Final   Culture   Final    NO SALMONELLA, SHIGELLA, CAMPYLOBACTER, YERSINIA, OR E.COLI 0157:[id number] ISOLATED Note: REDUCED NORMAL FLORA PRESENT Performed at [facility]    Report Status 04/13/2014 FINAL  Final  Blood Culture (routine x 2)     Status: None   Collection Time: 04/09/14  7:18 PM  Result Value Ref Range Status   Specimen Description BLOOD ARM RIGHT  Final   Special Requests BOTTLES DRAWN AEROBIC AND ANAEROBIC 5CC  Final   Culture   Final    NO GROWTH 5 DAYS Performed at [facility]    Report Status 04/16/2014 FINAL  Final  Blood Culture (routine x 2)     Status: None   Collection Time: 04/09/14  8:23 PM  Result Value Ref Range Status   Specimen Description BLOOD ARM LEFT  Final   Special Requests BOTTLES DRAWN AEROBIC AND ANAEROBIC 5CC  Final   Culture   Final    NO GROWTH 5 DAYS Performed at [facility]    Report Status 04/16/2014 FINAL  Final  MRSA PCR Screening     Status: None   Collection Time: 04/10/14  2:05 AM  Result Value Ref Range Status   MRSA by PCR NEGATIVE NEGATIVE Final    Comment:        The GeneXpert MRSA Assay (FDA approved for NASAL specimens only), is one component of a comprehensive MRSA colonization surveillance  program. It is not intended to diagnose MRSA infection nor to guide or monitor treatment for MRSA infections.   Culture, blood (routine x 2)     Status: None   Collection Time: 04/17/14  4:04 AM  Result Value Ref Range Status   Specimen Description BLOOD LEFT ARM  Final   Special Requests BOTTLES DRAWN AEROBIC AND ANAEROBIC 4CC EACH  Final   Culture   Final    NO GROWTH 5 DAYS Performed at [facility]    Report Status 04/23/2014 FINAL  Final  Culture, blood (routine x 2)     Status: None   Collection Time: 04/17/14  4:14 AM  Result Value Ref Range Status   Specimen Description BLOOD RIGHT ARM  Final   Special Requests BOTTLES DRAWN AEROBIC AND ANAEROBIC 10CC EACH  Final   Culture   Final     NO GROWTH 5 DAYS Note: Culture results may be compromised due to an excessive volume of blood received in culture bottles. Performed at [facility]    Report Status 04/23/2014 FINAL  Final  Urine culture     Status: None   Collection Time: 04/17/14  4:39 AM  Result Value Ref Range Status   Specimen Description URINE, CATHETERIZED  Final   Special Requests Immunocompromised  Final   Colony Count NO GROWTH Performed at [facility]   Final   Culture NO GROWTH Performed at [facility]   Final   Report Status 04/18/2014 FINAL  Final  Culture, blood (x 2)     Status: None   Collection Time: 04/17/14 12:20 PM  Result Value Ref Range Status   Specimen Description BLOOD LEFT HAND  Final   Special Requests BOTTLES DRAWN AEROBIC ONLY [facility] [facility]  Final   Culture   Final    NO GROWTH 5 DAYS Performed at [facility]    Report Status 04/23/2014 FINAL  Final  Culture, blood (x 2)     Status: None   Collection Time: 04/17/14 12:28 PM  Result Value Ref Range Status   Specimen Description BLOOD RIGHT ANTECUBITAL  Final   Special Requests BOTTLES DRAWN AEROBIC AND ANAEROBIC 10CC  Final   Culture   Final    NO GROWTH 5 DAYS Performed at [facility]    Report Status 04/23/2014 FINAL  Final  Culture, sputum-assessment     Status: None   Collection Time: 04/17/14  1:20 PM  Result Value Ref Range Status   Specimen Description SPUTUM  Final   Special Requests NONE  Final   Sputum evaluation   Final    MICROSCOPIC FINDINGS SUGGEST THAT THIS SPECIMEN IS NOT REPRESENTATIVE OF LOWER RESPIRATORY SECRETIONS. PLEASE RECOLLECT. REPORT CALLED TO L.SATTERFIELD,RN 04/17/14 1354 BY [facility]    Report Status 04/17/2014 FINAL  Final  Culture, expectorated sputum-assessment     Status: None   Collection Time: 04/17/14  5:38 PM  Result Value Ref Range Status   Specimen Description SPUTUM  Final   Special Requests Immunocompromised  Final   Sputum evaluation   Final     MICROSCOPIC FINDINGS SUGGEST THAT THIS SPECIMEN IS NOT REPRESENTATIVE OF LOWER RESPIRATORY SECRETIONS. PLEASE RECOLLECT. CALLED TO R.SARINE,RN AT 2148 BY L.PITT  04/17/14    Report Status 04/17/2014 FINAL  Final  Clostridium Difficile by PCR     Status: None   Collection Time: 04/17/14  9:10 PM  Result Value Ref Range Status   C difficile by pcr NEGATIVE NEGATIVE Final  Blood  Culture (routine x 2)     Status: None (Preliminary result)   Collection Time: 04/29/14 11:15 AM  Result Value Ref Range Status   Specimen Description BLOOD LEFT ANTECUBITAL  Final   Special Requests BOTTLES DRAWN AEROBIC AND ANAEROBIC 5CC  Final   Culture   Final    GRAM NEGATIVE RODS Note: Gram Stain Report Called to,Read Back By and Verified With: CAROL SCHEILLER RN 04/30/14 AT 14:00 BY [facility] Performed at [facility]    Report Status PENDING  Incomplete  Blood Culture (routine x 2)     Status: None (Preliminary result)   Collection Time: 04/29/14 11:18 AM  Result Value Ref Range Status   Specimen Description BLOOD HAND LEFT  Final   Special Requests BOTTLES DRAWN AEROBIC AND ANAEROBIC 5CC  Final   Culture   Final    GRAM NEGATIVE RODS Note: Gram Stain Report Called to,Read Back By and Verified With: [place] USSERY ON 2.16.2016 AT 7:55P BY WILEJ Performed at [facility]    Report Status PENDING  Incomplete  Urine culture     Status: None   Collection Time: 04/29/14 11:51 AM  Result Value Ref Range Status   Specimen Description URINE, CATHETERIZED  Final   Special Requests NONE  Final   Colony Count NO GROWTH Performed at [facility]   Final   Culture NO GROWTH Performed at [facility]   Final   Report Status 04/30/2014 FINAL  Final    Anti-infectives    Start     Dose/Rate Route Frequency Ordered Stop   04/30/14 1200  vancomycin (VANCOCIN) 1,250 mg in sodium chloride 0.9 % 250 mL IVPB     1,250 mg 166.7 mL/hr over 90 Minutes Intravenous Every 24 hours 04/29/14  1133     04/29/14 1500  aztreonam (AZACTAM) 2 g in dextrose 5 % 50 mL IVPB     2 g 100 mL/hr over 30 Minutes Intravenous Every 8 hours 04/29/14 1455     04/29/14 1200  ceFEPIme (MAXIPIME) 2 g in dextrose 5 % 50 mL IVPB  Status:  Discontinued     2 g 100 mL/hr over 30 Minutes Intravenous Every 24 hours 04/29/14 1133 04/29/14 1450   04/29/14 1115  vancomycin (VANCOCIN) 2,000 mg in sodium chloride 0.9 % 500 mL IVPB     2,000 mg 250 mL/hr over 120 Minutes Intravenous  Once 04/29/14 1107 04/29/14 1318      Assessment: Patient is an 81 y.o M with hx DVT s/p IVC filter he was supposed to be on xarelto but not taking this PTA currently on lovenox 100mg  SQ q12h. CBC low but stable.  No bleeding documented.  Vanc2/15>> Cefepime 2/15 x 1 dose Azactam 2/15>>  2/15 blood - ngtd 2/15 urine - neg FINAL 2/15 sputum - no sample yet  He's also on vancomycin and aztreonam for suspected PNA.  Scr is stable at 0.77 and all cultures have been negative thus far. TEE on 2/18 with no vegetation  Goal of Therapy:  Vancomycin trough level 15-20 mcg/ml  Plan:  - continue lovenox 100mg  SQ q12h - continue vancomycin 1250mg  q24h and aztreonam 2gm q8h -f/u cultures - please indicate plan/LOT for abx, pharmacy will plan on checking vancomycin level in the next couple of days if to continue with abx  Jibri Schriefer P 05/02/2014,11:33 AM

## 2014-05-02 NOTE — Care Management Note (Signed)
Page 1 of 2   05/07/2014     4:19:37 PM CARE MANAGEMENT NOTE 05/07/2014  Patient:  [name], [name]   Account Number:  [id number]  Date Initiated:  04/30/2014  Documentation initiated by:  Marvetta Gibbons  Subjective/Objective Assessment:   Pt admitted with sepsis     Action/Plan:   PTA pt was at [facility] (has been at [facility] since 03/01/14) prior to that was at home with wife briefly from stay at [facility] in 01/2014   Anticipated DC Date:  05/07/2014   Anticipated DC Plan:  [place]  In-house referral  Clinical Social Worker      DC Forensic scientist  CM consult      [place]   Choice offered to / List presented to:  C-4 Adult Children   DME arranged  [place]      DME agency  [place] arranged  HH-1 RN  [place]      [place]   Status of service:  Completed, signed off Medicare Important Message given?  YES (If response is "NO", the following Medicare IM given date fields will be blank) Date Medicare IM given:  05/02/2014 Medicare IM given by:  Whitman Hero Date Additional Medicare IM given:  05/06/2014 Additional Medicare IM given by:  Marvetta Gibbons  Discharge Disposition:  [place]  Per UR Regulation:  Reviewed for med. necessity/level of care/duration of stay  If discussed at [place] of Stay Meetings, dates discussed:   05/07/2014    Comments:  05/07/14- 57- Marvetta Gibbons RN, BSN [phone] Per MD plan has changed now and pt will need to go home with IV abx- order has been placed for PICC and will try to place this afternoon, per Pharmacy- IV abx dose will be ceefepimine 2gm q12h- call made to [facility] with [facility] regarding need for IV abx at home- and d/c plans- have also spoken with Katie with  [facility] regarding [facility] referral needs for HH-RN/PT/OT/aide/SW- awaiting orders- spoke with Brenton Grills with [facility] this am regarding DME needs and need for Hospital bed delivery - plan for delivery of DME is between 2-6 this afternoon- spoke with pt's daughter [name] via [place]- regarding d/c plans- once PICC placed- pt will be ready for d/c- family to call once [facility] there to deliver DME- then will call for ambulance transport home. [place] RN to go out to home this evening for IV abx and TF assistance.  [facility] referral and will follow pt in home (per [facility] pt also made [place])  05/06/14- 7- Marvetta Gibbons RN, BSN [phone] Met with pt's three daughters, Tamera Punt, and Pat regarding d/c plans, per conversation they all agree that they would like to take pt home with [facility]- desire to use [facility] for services as they have used them in past with good experience- also discussed Home Hospice should pt progress to this in future - Pt will need HH-RN/PT/OT/aide/SW for home- MD aware of need for orders, discussion was also given to DME needs- at this time they need hospital bed and wheelchair- orders have been placed- and call made to [facility] with [facility] for DME needs- hope to have delivery today or in AM at the latest  for possible d/c home tomorrow. Per MD pt will go home on po abx. NCM to f/u in am to finalize d/c plans   05/03/14- [place] RN, BSN [phone] Per CSW family wants to take home with [facility]- have not been able to speak with family in room- call made to daughter [name]- per conversation over phone- daughter confirms that plans are for home- they have used [facility] in past and this is their agency of choice- pt may need IV abx also for home- awaiting repeat BC to come back- ID following- if so pt will need PICC line for home- daughter also states that family will need education on PEG tube feedings- discussed DME needs- pt already has walker and elevated commode - may need BSC and w/c- family to let CM know- family for sure wants a  hospital bed for home- will leave private duty list per daughter request at the bedside- NCM to f/u with family on Monday to finalize needs - will need orders for both [facility] and DME for discharge

## 2014-05-02 NOTE — Evaluation (Signed)
Physical Therapy Evaluation Patient Details Name: [name] MRN: [id number] DOB: 29-May-1929 Today's Date: 05/02/2014   History of Present Illness  81 year old gentleman is status post repair of abdominal aortic aneurysm in 2014. 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 Admitted with sepsis 04/29/14.  Clinical Impression  Pt admitted with the above complications. Pt currently with functional limitations due to the deficits listed below (see PT Problem List). Moderate assist for transfers and min assist to ambulate up to 20 feet today. Presents with generalized weakness and is A&O x3. Noted that palliative care note states family wants pt to return home and they will arrange 24 hour care for patient at home. His wife is not capable of providing this care due to pts decreased mobility, and her frail health. If plan remains to take pt home, he will greatly benefit from [place] services. Pt will benefit from skilled PT to increase their independence and safety with mobility to allow discharge to the venue listed below.       Follow Up Recommendations SNF;Supervision/Assistance - 24 hour - (If family decides to take patient home and can provide 24 hour care for pt, he would greatly benefit from [place] services)    Equipment Recommendations  None recommended by PT    Recommendations for Other Services OT consult     Precautions / Restrictions Precautions Precautions: Fall Precaution Comments: Has PEG tube Restrictions Weight Bearing Restrictions: No      Mobility  Bed Mobility Overal bed mobility: Needs Assistance Bed Mobility: Supine to Sit     Supine to sit: Supervision     General bed mobility comments: Supervision for safety. VC for technique. Requires extra time but no physical assist.  Transfers Overall transfer level: Needs assistance Equipment used: Rolling walker (2 wheeled) Transfers: Sit to/from Stand Sit to Stand: Mod assist          General transfer comment: Mod assist for boost to stand from lowest bed setting and reclining chair. VC for hand placement and to lean anteriorly. Very slow to rise, but fair stability once upright.  Ambulation/Gait Ambulation/Gait assistance: Min assist Ambulation Distance (Feet): 20 Feet Assistive device: Rolling walker (2 wheeled) Gait Pattern/deviations: Step-through pattern;Decreased stride length;Trunk flexed;Narrow base of support;Staggering left Gait velocity: decreased   General Gait Details: Slow and guarded gait. Educated on safe DME use with rolling walker. One instance of staggering towards his left requiring min assist for stability. No buckling noted. Frequent cues for forward gaze and upright posture.  Stairs            Wheelchair Mobility    Modified Rankin (Stroke Patients Only)       Balance Overall balance assessment: Needs assistance Sitting-balance support: No upper extremity supported;Feet supported Sitting balance-Leahy Scale: Fair     Standing balance support: Single extremity supported Standing balance-Leahy Scale: Poor                               Pertinent Vitals/Pain Pain Assessment: 0-10 Pain Score: 0-No pain Pain Intervention(s): Monitored during session  VSS stable throughout therapy session    Home Living Family/patient expects to be discharged to:: Private residence Living Arrangements: Spouse/significant other Available Help at Discharge: Family;Available 24 hours/day Type of Home: House Home Access: Stairs to enter Entrance Stairs-Rails: None Entrance Stairs-Number of Steps: 1 Home Layout: One level Home Equipment: Walker - 2 wheels;Cane - single point;Shower seat;Wheelchair - manual  Additional Comments: Per palliative care note, pt to return home with 24 hour family care.    Prior Function Level of Independence: Independent with assistive device(s)   Gait / Transfers Assistance Needed: States he ambulates  with cane and RW at home  ADL's / Homemaking Assistance Needed: States he can bath/dress himself    Comments: Per previous admission, patient is with limited ambulation at home, and he needs assist with all ADLs. This conflicts with pts report today.     Hand Dominance   Dominant Hand: Right    Extremity/Trunk Assessment   Upper Extremity Assessment: Defer to OT evaluation           Lower Extremity Assessment: Generalized weakness         Communication   Communication: HOH  Cognition Arousal/Alertness: Awake/alert Behavior During Therapy: WFL for tasks assessed/performed Overall Cognitive Status: No family/caregiver present to determine baseline cognitive functioning                      General Comments      Exercises General Exercises - Lower Extremity Ankle Circles/Pumps: AROM;Both;10 reps;Seated      Assessment/Plan    PT Assessment Patient needs continued PT services  PT Diagnosis Difficulty walking;Generalized weakness   PT Problem List Decreased strength;Decreased activity tolerance;Decreased balance;Decreased mobility;Decreased knowledge of use of DME  PT Treatment Interventions DME instruction;Gait training;Stair training;Functional mobility training;Therapeutic activities;Therapeutic exercise;Balance training;Neuromuscular re-education;Patient/family education   PT Goals (Current goals can be found in the Care Plan section) Acute Rehab PT Goals Patient Stated Goal: None stated PT Goal Formulation: With patient Time For Goal Achievement: 05/16/14 Potential to Achieve Goals: Fair    Frequency Min 3X/week   Barriers to discharge Decreased caregiver support Previous note states pt wife cannot care for patient. Palliative care not on this admission states family is arranging 24/7 care for pt.    Co-evaluation               End of Session Equipment Utilized During Treatment: Gait belt Activity Tolerance: Patient limited by  fatigue Patient left: in chair;with call bell/phone within reach Nurse Communication: Mobility status         Time: 0962-8366 PT Time Calculation (min) (ACUTE ONLY): 25 min   Charges:   PT Evaluation $Initial PT Evaluation Tier I: 1 Procedure PT Treatments $Therapeutic Activity: 8-22 mins   PT G Codes:        Ellouise Newer 05/02/2014, 12:00 PM Elayne Snare, [place]

## 2014-05-02 NOTE — Progress Notes (Signed)
Patient ID: [name], male   DOB: 26-Jul-1929, 81 y.o.   MRN: [id number]         [facility] for Infectious Disease    Date of Admission:  04/29/2014   Continuous antibiotics since July 2015        Day 4 vancomycin        Day 4 aztreonam               Reason for Consult: Recurrent bacteremia and sepsis    Referring Physician: Dr. Emeline Gins Elgergawy  Principal Problem:   Sepsis Active Problems:   Bacteremia due to Gram-negative bacteria   History of Bacteremia due to Pseudomonas   Proteus infection   History of Clostridium difficile colitis   History of endovascular stent graft for abdominal aortic aneurysm   HTN (hypertension)   Thrombocytopenia   History of DVT of lower extremity (Feb 2015) -post IVC filter   Protein-calorie malnutrition, severe   Status post insertion of percutaneous endoscopic gastrostomy (PEG) tube   AAA (abdominal aortic aneurysm)   FTT (failure to thrive) in adult   Hypotension   Malnutrition of moderate degree   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 antiseptic oral rinse  7 mL Mouth Rinse BID  . aztreonam  2 g Intravenous Q8H  . enoxaparin (LOVENOX) injection  100 mg Subcutaneous Q12H  . feeding supplement (JEVITY 1.2 CAL)  1,000 mL Per Tube Q24H  . feeding supplement (PRO-STAT SUGAR FREE 64)  30 mL Oral TID WC  . lactulose  10 g Oral Daily  . magnesium oxide  200 mg Oral Daily  . methylphenidate  5 mg Oral Daily  . mirtazapine  15 mg Oral QHS  . simvastatin  20 mg Oral Daily    Recommendations: 1. Continue aztreonam pending final blood cultures 2. Stop vancomycin   Assessment: He has recurrent gram-negative rod bacteremia. Although recent CT scans have not shown clear evidence of graft infection and not very concerned that his persistent/recurrent sepsis infections are related to chronic graft infection which is  not curable with medical therapy and he is deemed not to be a candidate for further surgery. I will continue aztreonam alone pending final culture results and followup tomorrow.    HPI: [name] is a 81 y.o. male who underwent aortic graft repair last July at [facility]. Sometime postoperatively he developed a Pseudomonas bacteremia and graft infection. He has been on continuous antibiotics since that time. He has had for recent admissions here. He also had Proteus bacteremia in December. He's had C. Difficile colitis. He has been admitted with possible HCAP and sepsis. He was discharged home recently on high-dose oral ciprofloxacin but readmitted 6 days later on 04/29/2014 with a temperature of 104.6 and recurrent sepsis. Admission blood cultures are growing gram-negative rods again. He states he's not having any pain. He is not having any diarrhea.   Review of Systems: Pertinent items are noted in HPI.  Past Medical History  Diagnosis Date  . Hypertension   . BPH (benign prostatic hyperplasia)   . Hyperlipidemia   . AAA (abdominal aortic aneurysm) 04/2012    STENTING OF AAA IN [place]  . Junctional cardiac arrhythmia     Occurred postoperatively after urologic surgery  . Severe sinus bradycardia     Occurred postoperatively after urologic surgery  . Aortic  valve disorders   . Other primary cardiomyopathies   . Dysphagia   . Status post insertion of percutaneous endoscopic gastrostomy (PEG) tube 02/11/14  . Bacteremia due to Pseudomonas   . Infected aortic graft   . DVT (deep venous thrombosis) 04/2013    s/p IVC Filter  . Cancer 2008-2009    prostate TREATED WITH RADIATION    History  Substance Use Topics  . Smoking status: Former Smoker -- 2.00 packs/day for 37 years    Types: Cigarettes    Start date: 02/17/1949    Quit date: 03/15/1985  . Smokeless tobacco: Former Systems developer     Comment: quit 25 years ago  . Alcohol Use: No    Family History  Problem Relation Age of Onset   . Lung cancer Father    Allergies  Allergen Reactions  . Atorvastatin Other (See Comments)    Causes constipation  . Penicillins Other (See Comments)    Other reaction(s): RASH HIVES     OBJECTIVE: Blood pressure 132/88, pulse 77, temperature 98.1 F (36.7 C), temperature source Oral, resp. rate 18, height 6\' 2"  (1.88 m), weight 214 lb 11.7 oz (97.4 kg), SpO2 98 %. General: he was sleeping when I first entered the room but aroused easily. He is very hard of hearing. When I informed him that I was there because he has recurrent bloodstream infection he became very tearful and angry. Skin: no rash Lungs: clear Cor: distant but regular S1 and S2 with no murmurs Abdomen: soft and nontender with no palpable masses  Lab Results Lab Results  Component Value Date   WBC 4.0 05/02/2014   HGB 9.3* 05/02/2014   HCT 29.6* 05/02/2014   MCV 87.1 05/02/2014   PLT 128* 05/02/2014    Lab Results  Component Value Date   CREATININE 0.77 05/02/2014   BUN 21 05/02/2014   NA 140 05/02/2014   K 3.6 05/02/2014   CL 109 05/02/2014   CO2 25 05/02/2014    Lab Results  Component Value Date   ALT 11 04/30/2014   AST 19 04/30/2014   ALKPHOS 39 04/30/2014   BILITOT 0.4 04/30/2014     Microbiology: Recent Results (from the past 240 hour(s))  Blood Culture (routine x 2)     Status: None (Preliminary result)   Collection Time: 04/29/14 11:15 AM  Result Value Ref Range Status   Specimen Description BLOOD LEFT ANTECUBITAL  Final   Special Requests BOTTLES DRAWN AEROBIC AND ANAEROBIC 5CC  Final   Culture   Final    GRAM NEGATIVE RODS Note: Gram Stain Report Called to,Read Back By and Verified With: CAROL SCHEILLER RN 04/30/14 AT 14:00 BY [facility] Performed at [facility]    Report Status PENDING  Incomplete  Blood Culture (routine x 2)     Status: None (Preliminary result)   Collection Time: 04/29/14 11:18 AM  Result Value Ref Range Status   Specimen Description BLOOD HAND LEFT  Final    Special Requests BOTTLES DRAWN AEROBIC AND ANAEROBIC 5CC  Final   Culture   Final    GRAM NEGATIVE RODS Note: Gram Stain Report Called to,Read Back By and Verified With: [place] USSERY ON 2.16.2016 AT 7:55P BY WILEJ Performed at [facility]    Report Status PENDING  Incomplete  Urine culture     Status: None   Collection Time: 04/29/14 11:51 AM  Result Value Ref Range Status   Specimen Description URINE, CATHETERIZED  Final   Special Requests NONE  Final   Colony Count NO GROWTH Performed at [facility]   Final   Culture NO GROWTH Performed at [facility]   Final   Report Status 04/30/2014 FINAL  Final    Michel Bickers, MD [facility] for [place] [facility] [phone] pager   [phone] cell 05/02/2014, 6:44 PM

## 2014-05-02 NOTE — Progress Notes (Signed)
TEAM 1 - Stepdown/ICU TEAM Progress Note  [name] QIW:[id number] DOB: [birth year] DOA: 04/29/2014 PCP: [name], MD  Admit HPI / Brief Narrative: 81 year old male with PMH of AAA s/p repair and graft infection causing chronic endovascular leak and infection with prolonged antibiotic course. (Last course of treatment was on 03/07/2014), Pseudomonas bacteremia, dysphagia with recurrent aspiration requiring PEG tube placement, DVT s/p IVC filter placement, hypertension, atrial flutter, dyslipidemia, systolic CHF (EF of 41%), with recurrent hospitalizations related to PNA and/or sepsis (this is 6th hospitalization in last 3 months).  Patient was recently hospitalized for sepsis and discharged  on high-dose oral ciprofloxacin (750 mg twice daily) with plan on palliative care follow-up at SNF. As per the son,  When visiting his father morning of admission, he found him with fever and chills. Patient reports  an episode of nonbloody vomiting yesterday morning, and having productive cough for the last 2-3 days. Patient has been having small amount of food at the facility besides being on PEG feeds during the night. He denies headache, dizziness, chest pain, palpitations, SOB, abdominal pain, bowel or urinary symptoms.He is nonambulatory at baseline. In ED, workup revealed T 104.6, pulse 125, RR 32, BP 81/51, and lactic acid was elevated at 2.5. UA was cloudy with mod Hgb and small leukocyte. CXR with possible left LL infilterate. EKG with sinus tachycardia  At 125, and anterior T wave inversion, prolonged QTc 591.  He was placed on IV Vancomycin and Cefepime, given 3L NS bolus, with improvement in BP. Pt was then admitted with sepsis for further workup.  He was later transitioned to IV vancomycin and Azactam, blood cultures growing gram-negative rods 2. Agent 24 TEE on 2/18, with no evidence of vegetation.  HPI/Subjective: Patient denies any chest pain, cough, shortness of breath, fever or  chills,  Assessment/Plan:  Sepsis/ gram-negative bacteremia -Initially with empiric vancomycin and aztreonam, will stop IV vancomycin 2/18, given blood culture growing only gram-negative rods. -High dose Cipro on recent discharge DC due to QT prolongation  - TEE 2/18 showing no vegetation -blood cultures growing gram-negative rods , awaiting specifications and sensitivity, this is most likely related to his recurrent AAA graft infection. -urine strep/ legionella antigen negative - Will repeat blood cultures to document clearing of infection, requested ID consult.  Systolic Congestive heart failure -Strict in and out -Daily weight -Patient's PCXR from 2/15 shows pulmonary edema; Lasix 40 mg 1   Prolonged QTC -Increasing QTC on repeat EKG 2/16 -hold ciprofloxacin, Pepcid, Toviaz, stopped Remeron. -Cards following -Trop x3  negative -Mag normal - Continue with telemetry monitor  DVT of lower extremity (Feb 2015) -s/p IVC filter-not on anticoagulation, fall risk?? -Per oncology note 12/26/13 with NP Laverna Peace; thrombo-embolism in the left leg. He was on Xarelto. Patient was to stay on Xarelto for a year.  -Started on Lovenox, must revisit at discharge to  -Appreciate Oncology consult- continue Lovenox, stop if plat drop < 50k-determine need for it.   Leukopenia/Anemia/Thrombocytopenia -Oncology following   AAA (abdominal aortic aneurysm) -Stable per CT scan 2/15  Chronic dysphagia -Patient with history of dysphagia, and dysphagia 3 with thin liquid -Nutrition consult for PEG tube feedings  - Continue with nocturnal tube feeds  ?UTI -UA with mod Hgb, small leukocytes, few bacteria -urine culture- no growth  History of C. Difficile -Recent stool for c diff negative. Monitor for now while on antibiotics.   Protein-calorie malnutrition, severe -Restart tube feeds per nutrition, continue with supplement    Code Status:  FULL Family Communication: no family  present at time of exam Disposition Plan: Telemetry    Consultants: Cardiology  Oncology    Procedure/Significant Events:  TEE 2/18 1/27 echocardiogram;- LVEF= 35% to 40%. Diffuse hypokinesis. - Aortic valve: moderately stenotic.  - Left atrium: mildly to moderately dilated. 2/15 CT angiogram chest PE protocol;-Post stenting of thoracoabdominal aorta through aortic bifurcation into the iliac systems bilaterally farther on RIGHT. -Additional stenting of the celiac, SMA and renal arteries. 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 -Scattered mild pulmonary atelectasis and small pleural effusions  2/15 PCXR;Cardiomegaly with mild CHF. Hazy left lower lobe airspace disease ;atelectasis vs pneumonia.    Antibiotics: Aztreonam 2/15>> Vancomycin 2/15>> 2/18   DVT prophylaxis: Lovenox treatment dose   Devices IVC filter   LINES / TUBES:          Objective: VITAL SIGNS: Temp: 98.1 F (36.7 C) (02/18 1325) Temp Source: Oral (02/18 1325) BP: 132/88 mmHg (02/18 1325) Pulse Rate: 77 (02/18 1325) SPO2; FIO2:   Intake/Output Summary (Last 24 hours) at 05/02/14 1523 Last data filed at 05/02/14 1325  Gross per 24 hour  Intake   1155 ml  Output   1275 ml  Net   -120 ml     Exam: General: A/O 4, NAD, No acute respiratory distress Lungs: diffuse rhonchi, decreased breath sounds LLL, with wheezes RUL  Cardiovascular: Regular rate and rhythm . Murmur heard Abdomen: Nontender, nondistended, soft, bowel sounds positive, no rebound, no ascites, no appreciable mass, PEG tube in place negative sign of infection Extremities: No significant cyanosis, clubbing; bilateral mild pedal edema   Data Reviewed: Basic Metabolic Panel:  Recent Labs Lab 04/29/14 1107 04/30/14 0900 05/02/14 0540  NA 134* 136 140  K 4.1 3.6 3.6  CL 103 112 109   CO2 25 22 25   GLUCOSE 121* 89 86  BUN 21 22 21   CREATININE 1.16 0.93 0.77  CALCIUM 9.1 8.1* 8.8  MG 1.7 1.8  --    Liver Function Tests:  Recent Labs Lab 04/29/14 1107 04/30/14 0900  AST 26 19  ALT 12 11  ALKPHOS 57 39  BILITOT 0.4 0.4  PROT 6.1 5.0*  ALBUMIN 2.4* 2.0*   No results for input(s): LIPASE, AMYLASE in the last 168 hours. No results for input(s): AMMONIA in the last 168 hours. CBC:  Recent Labs Lab 04/29/14 1107 04/30/14 0900 05/01/14 0340 05/02/14 0540  WBC 5.5 4.1 3.6* 4.0  NEUTROABS 4.2 3.1  --   --   HGB 10.3* 8.1* 8.8* 9.3*  HCT 30.7* 25.1* 27.7* 29.6*  MCV 82.5 84.8 84.7 87.1  PLT 114* 88* 97* 128*   Cardiac Enzymes:  Recent Labs Lab 04/30/14 0900 04/30/14 1401 04/30/14 2005  TROPONINI <0.03 <0.03 <0.03   BNP (last 3 results)  Recent Labs  04/09/14 1918  BNP 1434.7*    ProBNP (last 3 results) No results for input(s): PROBNP in the last 8760 hours.  CBG: No results for input(s): GLUCAP in the last 168 hours.  Recent Results (from the past 240 hour(s))  Blood Culture (routine x 2)     Status: None (Preliminary result)   Collection Time: 04/29/14 11:15 AM  Result Value Ref Range Status   Specimen Description BLOOD LEFT ANTECUBITAL  Final   Special Requests BOTTLES DRAWN AEROBIC AND ANAEROBIC 5CC  Final   Culture   Final    Lonell Grandchild  NEGATIVE RODS Note: Gram Stain Report Called to,Read Back By and Verified With: CAROL SCHEILLER RN 04/30/14 AT 14:00 BY [facility] Performed at [facility]    Report Status PENDING  Incomplete  Blood Culture (routine x 2)     Status: None (Preliminary result)   Collection Time: 04/29/14 11:18 AM  Result Value Ref Range Status   Specimen Description BLOOD HAND LEFT  Final   Special Requests BOTTLES DRAWN AEROBIC AND ANAEROBIC 5CC  Final   Culture   Final    GRAM NEGATIVE RODS Note: Gram Stain Report Called to,Read Back By and Verified With: [place] USSERY ON 2.16.2016 AT 7:55P BY WILEJ Performed  at [facility]    Report Status PENDING  Incomplete  Urine culture     Status: None   Collection Time: 04/29/14 11:51 AM  Result Value Ref Range Status   Specimen Description URINE, CATHETERIZED  Final   Special Requests NONE  Final   Colony Count NO GROWTH Performed at [facility]   Final   Culture NO GROWTH Performed at [facility]   Final   Report Status 04/30/2014 FINAL  Final     Studies:  Recent x-ray studies have been reviewed in detail by the Attending Physician  Scheduled Meds:  Scheduled Meds: . antiseptic oral rinse  7 mL Mouth Rinse BID  . aztreonam  2 g Intravenous Q8H  . enoxaparin (LOVENOX) injection  100 mg Subcutaneous Q12H  . feeding supplement (JEVITY 1.2 CAL)  1,000 mL Per Tube Q24H  . feeding supplement (PRO-STAT SUGAR FREE 64)  30 mL Oral TID WC  . lactulose  10 g Oral Daily  . magnesium oxide  200 mg Oral Daily  . methylphenidate  5 mg Oral Daily  . mirtazapine  15 mg Oral QHS  . simvastatin  20 mg Oral Daily    Time spent on care of this patient: 30 mins   Yuriko Portales , MD  [facility] Office  [phone] Pager [phone]  On-Call/Text Page:      [email or web address]      password [email or web address]  If 7PM-7AM, please contact night-coverage [email or web address] Password [email or web address] 05/02/2014, 3:23 PM   LOS: 3 days

## 2014-05-02 NOTE — Interval H&P Note (Signed)
History and Physical Interval Note:  05/02/2014 8:07 AM  [name]  has presented today for surgery, with the diagnosis of SBE  The various methods of treatment have been discussed with the patient and family. After consideration of risks, benefits and other options for treatment, the patient has consented to  Procedure(s): TRANSESOPHAGEAL ECHOCARDIOGRAM (TEE) (N/A) as a surgical intervention .  The patient's history has been reviewed, patient examined, no change in status, stable for surgery.  I have reviewed the patient's chart and labs.  Questions were answered to the patient's satisfaction.     Kirk Ruths

## 2014-05-02 NOTE — CV Procedure (Signed)
See full TEE report in [employer]; no vegetations identified. Kirk Ruths

## 2014-05-03 ENCOUNTER — Encounter (HOSPITAL_COMMUNITY): Payer: Self-pay | Admitting: Cardiology

## 2014-05-03 DIAGNOSIS — Z09 Encounter for follow-up examination after completed treatment for conditions other than malignant neoplasm: Secondary | ICD-10-CM

## 2014-05-03 DIAGNOSIS — A4150 Gram-negative sepsis, unspecified: Secondary | ICD-10-CM

## 2014-05-03 LAB — CULTURE, BLOOD (ROUTINE X 2)

## 2014-05-03 LAB — CBC
HCT: 28 % — ABNORMAL LOW (ref 39.0–52.0)
HEMOGLOBIN: 8.8 g/dL — AB (ref 13.0–17.0)
MCH: 26.7 pg (ref 26.0–34.0)
MCHC: 31.4 g/dL (ref 30.0–36.0)
MCV: 84.8 fL (ref 78.0–100.0)
PLATELETS: 138 10*3/uL — AB (ref 150–400)
RBC: 3.3 MIL/uL — ABNORMAL LOW (ref 4.22–5.81)
RDW: 17.3 % — ABNORMAL HIGH (ref 11.5–15.5)
WBC: 3.9 10*3/uL — ABNORMAL LOW (ref 4.0–10.5)

## 2014-05-03 LAB — BASIC METABOLIC PANEL
ANION GAP: 1 — AB (ref 5–15)
BUN: 20 mg/dL (ref 6–23)
CALCIUM: 8.6 mg/dL (ref 8.4–10.5)
CHLORIDE: 109 mmol/L (ref 96–112)
CO2: 30 mmol/L (ref 19–32)
CREATININE: 0.76 mg/dL (ref 0.50–1.35)
GFR calc Af Amer: >90 mL/min (ref >90)
GFR calc non Af Amer: 83 mL/min — ABNORMAL LOW (ref >90)
Glucose, Bld: 108 mg/dL — ABNORMAL HIGH (ref 70–99)
Potassium: 3.8 mmol/L (ref 3.5–5.1)
SODIUM: 140 mmol/L (ref 135–145)

## 2014-05-03 LAB — PROTEIN ELECTROPHORESIS, SERUM
ALBUMIN ELP: 43.6 % — AB (ref 55.8–66.1)
ALPHA-2-GLOBULIN: 15.4 % — AB (ref 7.1–11.8)
Alpha-1-Globulin: 7.1 % — ABNORMAL HIGH (ref 2.9–4.9)
BETA GLOBULIN: 6.5 % (ref 4.7–7.2)
Beta 2: 5.7 % (ref 3.2–6.5)
GAMMA GLOBULIN: 21.7 % — AB (ref 11.1–18.8)
M-SPIKE, %: NOT DETECTED g/dL
TOTAL PROTEIN ELP: 5.4 g/dL — AB (ref 6.0–8.3)

## 2014-05-03 MED ORDER — DEXTROSE 5 % IV SOLN
2.0000 g | Freq: Two times a day (BID) | INTRAVENOUS | Status: DC
  Administered 2014-05-03 – 2014-05-06 (×7): 2 g via INTRAVENOUS
  Filled 2014-05-03 (×8): qty 2

## 2014-05-03 NOTE — Progress Notes (Signed)
    Subjective:  Awake and alert- HOH- no complaints   Objective:  Vital Signs in the last 24 hours: Temp:  [97.9 F (36.6 C)-98.3 F (36.8 C)] 98.3 F (36.8 C) (02/19 0417) Pulse Rate:  [77-83] 83 (02/19 0417) Resp:  [18] 18 (02/19 0417) BP: (132-144)/(81-89) 144/89 mmHg (02/19 0417) SpO2:  [98 %] 98 % (02/19 0417)  Intake/Output from previous day:  Intake/Output Summary (Last 24 hours) at 05/03/14 1202 Last data filed at 05/03/14 1154  Gross per 24 hour  Intake 3166.67 ml  Output   1050 ml  Net 2116.67 ml    Physical Exam: General appearance: alert, cooperative and no distress Lungs: decreased at bases Heart: regular rate and rhythm   Rate: 84  Rhythm: normal sinus rhythm  Lab Results:  Recent Labs  05/02/14 0540 05/03/14 0550  WBC 4.0 3.9*  HGB 9.3* 8.8*  PLT 128* 138*    Recent Labs  05/02/14 0540 05/03/14 0550  NA 140 140  K 3.6 3.8  CL 109 109  CO2 25 30  GLUCOSE 86 108*  BUN 21 20  CREATININE 0.77 0.76    Recent Labs  04/30/14 1401 04/30/14 2005  TROPONINI <0.03 <0.03    Recent Labs  05/01/14 1830  INR 1.25    Imaging: Imaging results have been reviewed  Cardiac Studies:  Assessment/Plan:  81 year old gentleman is status post repair of abdominal aortic aneurysm in 2014. 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 He has been treated with prolonged antibiotics. He has a history of Pseudomonas bacteremia. He was most recently treated with outpatient high-dose oral ciprofloxacin 750 mg twice a day. This has subsequently been stopped because of prolonged QTc interval. He was admitted with fever 04/29/14. TEE done did not show vegetation.    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 We will be available over the weekend for any cardiac issues.   Kerin Ransom PA-C Beeper [phone] 05/03/2014, 12:02 PM  The patient was seen, examined and discussed with Kerin Ransom, PA-C and I agree with the above.   TEE yesterday negative for endocarditis. QT/QTC minimally improved, on 2/16 QT/QTc 554/610 , on 2/17 486/578 ms, today 498/567y, no VT on telemetry.  Continue to avoid any drugs which would prolong the QT interval.  Dorothy Spark 05/03/2014

## 2014-05-03 NOTE — Progress Notes (Signed)
Discontinued Enteric Precautions after speaking with Infectious Disease Control via phone.  Pt has not had frequent loose stools, but had just had one yesterday evening.  Infectious Disease RN called and stated it was okay for pt to come off of isolation.  Order was then D/C'd according to protocol.

## 2014-05-03 NOTE — Progress Notes (Signed)
Patient ID: [name], male   DOB: 02-07-1930, 81 y.o.   MRN: [id number]         [place] for Infectious Disease    Date of Admission:  04/29/2014   Continuous antibiotic therapy since July 2015        Day 5 aztreonam          Principal Problem:   Sepsis Active Problems:   Bacteremia due to Gram-negative bacteria   History of Bacteremia due to Pseudomonas   Proteus infection   History of Clostridium difficile colitis   History of endovascular stent graft for abdominal aortic aneurysm   HTN (hypertension)   Thrombocytopenia   History of DVT of lower extremity (Feb 2015) -post IVC filter   Protein-calorie malnutrition, severe   Status post insertion of percutaneous endoscopic gastrostomy (PEG) tube   AAA (abdominal aortic aneurysm)   FTT (failure to thrive) in adult   Hypotension   Malnutrition of moderate degree   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 antiseptic oral rinse  7 mL Mouth Rinse BID  . aztreonam  2 g Intravenous Q8H  . enoxaparin (LOVENOX) injection  40 mg Subcutaneous Q24H  . feeding supplement (JEVITY 1.2 CAL)  1,000 mL Per Tube Q24H  . feeding supplement (PRO-STAT SUGAR FREE 64)  30 mL Oral TID WC  . lactulose  10 g Oral Daily  . magnesium oxide  200 mg Oral Daily  . methylphenidate  5 mg Oral Daily  . mirtazapine  15 mg Oral QHS  . simvastatin  20 mg Oral Daily    Subjective: He states that he is feeling better. He denies any pain. He denies diarrhea but his nurse tells me that he had one loose stool overnight. He is been placed and enteric precautions pending repeat C. difficile PCR.  Review of Systems: Pertinent items are noted in HPI.  Past Medical History  Diagnosis Date  . Hypertension   . BPH (benign prostatic hyperplasia)   . Hyperlipidemia   . AAA (abdominal aortic aneurysm) 04/2012    STENTING OF AAA IN  [place]  . Junctional cardiac arrhythmia     Occurred postoperatively after urologic surgery  . Severe sinus bradycardia     Occurred postoperatively after urologic surgery  . Aortic valve disorders   . Other primary cardiomyopathies   . Dysphagia   . Status post insertion of percutaneous endoscopic gastrostomy (PEG) tube 02/11/14  . Bacteremia due to Pseudomonas   . Infected aortic graft   . DVT (deep venous thrombosis) 04/2013    s/p IVC Filter  . Cancer 2008-2009    prostate TREATED WITH RADIATION    History  Substance Use Topics  . Smoking status: Former Smoker -- 2.00 packs/day for 37 years    Types: Cigarettes    Start date: 02/17/1949    Quit date: 03/15/1985  . Smokeless tobacco: Former Systems developer     Comment: quit 25 years ago  . Alcohol Use: No    Family History  Problem Relation Age of Onset  . Lung cancer Father    Allergies  Allergen Reactions  . Atorvastatin Other (See Comments)    Causes constipation  . Penicillins Other (See Comments)    Other reaction(s): RASH HIVES     OBJECTIVE: Blood pressure 144/89, pulse 83, temperature 98.3 F (36.8 C), temperature source  Oral, resp. rate 18, height 6\' 2"  (1.88 m), weight 214 lb 11.7 oz (97.4 kg), SpO2 98 %. General: He is sleeping but arouses easily. He appears to be slightly confused. Skin: No rash Lungs: Clear Cor: Distant heart sounds Abdomen: Soft and nontender. PEG tube site is normal. Healed midline incision  Lab Results Lab Results  Component Value Date   WBC 3.9* 05/03/2014   HGB 8.8* 05/03/2014   HCT 28.0* 05/03/2014   MCV 84.8 05/03/2014   PLT 138* 05/03/2014    Lab Results  Component Value Date   CREATININE 0.76 05/03/2014   BUN 20 05/03/2014   NA 140 05/03/2014   K 3.8 05/03/2014   CL 109 05/03/2014   CO2 30 05/03/2014    Lab Results  Component Value Date   ALT 11 04/30/2014   AST 19 04/30/2014   ALKPHOS 39 04/30/2014   BILITOT 0.4 04/30/2014     Microbiology: Recent Results  (from the past 240 hour(s))  Blood Culture (routine x 2)     Status: None   Collection Time: 04/29/14 11:15 AM  Result Value Ref Range Status   Specimen Description BLOOD LEFT ANTECUBITAL  Final   Special Requests BOTTLES DRAWN AEROBIC AND ANAEROBIC 5CC  Final   Culture   Final    PROTEUS MIRABILIS Note: SUSCEPTIBILITIES PERFORMED ON PREVIOUS CULTURE WITHIN THE LAST 5 DAYS. Note: Gram Stain Report Called to,Read Back By and Verified With: CAROL SCHEILLER RN 04/30/14 AT 14:00 BY [facility] Performed at [facility]    Report Status 05/03/2014 FINAL  Final  Blood Culture (routine x 2)     Status: None   Collection Time: 04/29/14 11:18 AM  Result Value Ref Range Status   Specimen Description BLOOD HAND LEFT  Final   Special Requests BOTTLES DRAWN AEROBIC AND ANAEROBIC 5CC  Final   Culture   Final    PROTEUS MIRABILIS Note: Gram Stain Report Called to,Read Back By and Verified With: [place] USSERY ON 2.16.2016 AT 7:55P BY WILEJ Performed at [facility]    Report Status 05/03/2014 FINAL  Final   Organism ID, Bacteria PROTEUS MIRABILIS  Final      Susceptibility   Proteus mirabilis - MIC*    AMPICILLIN <=2 SENSITIVE Sensitive     AMPICILLIN/SULBACTAM <=2 SENSITIVE Sensitive     CEFAZOLIN 8 SENSITIVE Sensitive     CEFEPIME <=1 SENSITIVE Sensitive     CEFTAZIDIME <=1 SENSITIVE Sensitive     CEFTRIAXONE <=1 SENSITIVE Sensitive     CIPROFLOXACIN >=4 RESISTANT Resistant     GENTAMICIN >=16 RESISTANT Resistant     IMIPENEM 1 SENSITIVE Sensitive     PIP/TAZO <=4 SENSITIVE Sensitive     TOBRAMYCIN 8 INTERMEDIATE Intermediate     TRIMETH/SULFA >=320 RESISTANT Resistant     * PROTEUS MIRABILIS  Urine culture     Status: None   Collection Time: 04/29/14 11:51 AM  Result Value Ref Range Status   Specimen Description URINE, CATHETERIZED  Final   Special Requests NONE  Final   Colony Count NO GROWTH Performed at [facility]   Final   Culture NO GROWTH Performed at  [facility]   Final   Report Status 04/30/2014 FINAL  Final  Culture, blood (routine x 2)     Status: None (Preliminary result)   Collection Time: 05/02/14  3:40 PM  Result Value Ref Range Status   Specimen Description BLOOD RIGHT ARM  Final   Special Requests BOTTLES DRAWN AEROBIC AND  ANAEROBIC 10CC  Final   Culture   Final           BLOOD CULTURE RECEIVED NO GROWTH TO DATE CULTURE WILL BE HELD FOR 5 DAYS BEFORE ISSUING A FINAL NEGATIVE REPORT Performed at [facility]    Report Status PENDING  Incomplete  Culture, blood (routine x 2)     Status: None (Preliminary result)   Collection Time: 05/02/14  3:50 PM  Result Value Ref Range Status   Specimen Description BLOOD RIGHT HAND  Final   Special Requests BOTTLES DRAWN AEROBIC ONLY 10CC  Final   Culture   Final           BLOOD CULTURE RECEIVED NO GROWTH TO DATE CULTURE WILL BE HELD FOR 5 DAYS BEFORE ISSUING A FINAL NEGATIVE REPORT Performed at [facility]    Report Status PENDING  Incomplete    Assessment: He has been followed at [facility] by Dr. Vista Deck (ID) for Pseudomonas endovascular aortic graft infection that developed after his surgery last July. He had been receiving long-term suppressive levofloxacin but developed Proteus bacteremia in December. His Proteus isolate was resistant to fluoroquinolone antibiotics. He was transitioned back to levofloxacin upon discharge and was rehospitalized earlier this month. Blood cultures that time were negative. He was discharged on ciprofloxacin but now has Proteus bacteremia again resistant to ciprofloxacin. He is also had C. difficile colitis documented last December. I suspect that he now also has Proteus graft infection and will require long-term suppressive therapy to cover both his Pseudomonas and Proteus. This will put him at very high risk for relapse of his C. difficile colitis. I will treat him with cefepime for now. It will cover both Pseudomonas and Proteus.  Next week I will consider converting him to oral levofloxacin and amoxicillin. If his repeat C. difficile PCR is positive he will need to be started back on oral vancomycin 125 mg 4 times a day. If he has relapsed C. difficile PCR he may need to stay on long-term oral vancomycin.  Plan: 1. Change aztreonam to cefepime and treat at least through the weekend 2. His C. difficile PCR is positive start oral vancomycin 125 mg 4 times daily 3. Please call Dr. Lita Mains [phone]) for any infectious disease questions this weekend  Michel Bickers, MD [facility] [place] [facility] [phone] pager   [phone] cell 05/03/2014, 8:54 AM

## 2014-05-03 NOTE — Progress Notes (Signed)
Physical Therapy Treatment Patient Details Name: [name] MRN: [id number] DOB: Dec 06, 1929 Today's Date: 05/03/2014    History of Present Illness 81 year old gentleman is status post repair of abdominal aortic aneurysm in 2014. 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 Admitted with sepsis 04/29/14.    PT Comments    Patient progressing towards PT goals, ambulating up to 55 feet today with min guard assist. Continues to require mod assist to stand from his bed. Discussed importance of being out of bed and in chair however he continues to refuse to sit up for any period of time. Patient will continue to benefit from skilled physical therapy services to further improve independence with functional mobility.   Follow Up Recommendations  SNF;Supervision/Assistance - 24 hour (If family decides to take patient home and can provide 24 hour care for pt, he would greatly benefit from [place] Recommendations  None recommended by PT    Recommendations for Other Services OT consult     Precautions / Restrictions Precautions Precautions: Fall Precaution Comments: Has PEG tube Restrictions Weight Bearing Restrictions: No    Mobility  Bed Mobility Overal bed mobility: Modified Independent             General bed mobility comments: Uses rail  Transfers Overall transfer level: Needs assistance Equipment used: Rolling walker (2 wheeled) Transfers: Sit to/from Stand Sit to Stand: Mod assist         General transfer comment: Mod assist for boost to stand from lowest bed settingVC for hand placement and to lean anteriorly. Very slow to rise.  Ambulation/Gait Ambulation/Gait assistance: Min guard Ambulation Distance (Feet): 55 Feet Assistive device: Rolling walker (2 wheeled) Gait Pattern/deviations: Step-through pattern;Decreased stride length;Trunk flexed Gait velocity: decreased   General Gait Details: Slow but steady  with min guard assist for safety. Cues for upright posture and walker placement. No loss of balance noted.   Stairs            Wheelchair Mobility    Modified Rankin (Stroke Patients Only)       Balance                                    Cognition Arousal/Alertness: Awake/alert Behavior During Therapy: WFL for tasks assessed/performed Overall Cognitive Status: No family/caregiver present to determine baseline cognitive functioning                      Exercises General Exercises - Lower Extremity Ankle Circles/Pumps: AROM;Both;10 reps;Seated Long Arc Quad: Strengthening;Both;10 reps;Seated Heel Slides: Strengthening;Both;5 reps;Supine Hip Flexion/Marching: Strengthening;Both;10 reps;Seated    General Comments General comments (skin integrity, edema, etc.): Spoke with patient the importance of being out of bed. Refuses to sit in chair today.      Pertinent Vitals/Pain Pain Assessment: No/denies pain  SpO2 98% on room air.    Home Living                      Prior Function            PT Goals (current goals can now be found in the care plan section) Acute Rehab PT Goals Patient Stated Goal: None stated PT Goal Formulation: With patient Time For Goal Achievement: 05/16/14 Potential to Achieve Goals: Fair Progress towards PT goals: Progressing toward goals    Frequency  Min 3X/week  PT Plan Current plan remains appropriate    Co-evaluation             End of Session Equipment Utilized During Treatment: Gait belt Activity Tolerance: Other (comment) (Patient self limiting) Patient left: with call bell/phone within reach;in bed     Time: 9030-0923 PT Time Calculation (min) (ACUTE ONLY): 16 min  Charges:  $Gait Training: 8-22 mins                    G Codes:      Ellouise Newer 05-25-2014, 10:22 AM Elayne Snare, [place]

## 2014-05-03 NOTE — Progress Notes (Signed)
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 (Last course of treatment was on 03/07/2014), Pseudomonas bacteremia, dysphagia with recurrent aspiration requiring PEG tube placement, DVT s/p IVC filter placement, hypertension, atrial flutter, dyslipidemia, systolic CHF (EF of 19%), with recurrent hospitalizations related to PNA and/or sepsis (this is 6th hospitalization in last 3 months).  Patient was recently hospitalized for sepsis and discharged  on high-dose oral ciprofloxacin (750 mg twice daily) with plan on palliative care follow-up at SNF. As per the son,  When visiting his father morning of admission, he found him with fever and chills. Patient reports  an episode of nonbloody vomiting yesterday morning, and having productive cough for the last 2-3 days. Patient has been having small amount of food at the facility besides being on PEG feeds during the night. He denies headache, dizziness, chest pain, palpitations, SOB, abdominal pain, bowel or urinary symptoms.He is nonambulatory at baseline. In ED, workup revealed T 104.6, pulse 125, RR 32, BP 81/51, and lactic acid was elevated at 2.5. UA was cloudy with mod Hgb and small leukocyte. CXR with possible left LL infilterate. EKG with sinus tachycardia  At 125, and anterior T wave inversion, prolonged QTc 591.  He was placed on IV Vancomycin and Cefepime, given 3L NS bolus, with improvement in BP. Pt was then admitted with sepsis for further workup.  He was later transitioned to IV vancomycin and Azactam, blood cultures growing gram-negative rods 2(Proteus)  . Agent 24 TEE on 2/18, with no evidence of vegetation.  HPI/Subjective: Patient denies any chest pain, cough, shortness of  breath, fever or chills,  Assessment/Plan:  Sepsis/ bacteremia (PROTEUS MIRABILIS ) -Initially with empiric vancomycin and aztreonam, stopped IV vancomycin 2/18, given blood culture growing only gram-negative rods, aztreonam swelling chest to cefepime on 2/19 by ID . -High dose Cipro on recent discharge DC due to QT prolongation  - TEE 2/18 showing no vegetation -blood cultures growing PROTEUS MIRABILIS  ,this is most likely related to his recurrent AAA graft infection. -urine strep/ legionella antigen negative - Repeat blood cultures 2/18 no growth to date.  Systolic Congestive heart failure -Strict in and out -Daily weight -Patient's PCXR from 2/15 shows pulmonary edema; Lasix 40 mg 1   Prolonged QTC -Increasing QTC on repeat EKG 2/16 -hold ciprofloxacin, Pepcid, Toviaz, stopped Remeron. -Cards following -Trop x3  negative -Mag normal - Continue with telemetry monitor  DVT of lower extremity (Feb 2015) -s/p IVC filter-not on anticoagulation, fall risk?? -Per oncology note 12/26/13 with NP Laverna Peace; thrombo-embolism in the left leg. He was on Xarelto. Patient was to stay on Xarelto for a year.  -Started on Lovenox, must revisit at discharge  -Appreciate Oncology consult- continue Lovenox, stop if plat drop < 50k-determine need for it.   Leukopenia/Anemia/Thrombocytopenia -Oncology following   AAA (abdominal aortic aneurysm) -Stable per CT scan 2/15  Chronic dysphagia -Patient with history of dysphagia, and dysphagia 3 with thin liquid -Nutrition consult for PEG tube feedings  - Continue with nocturnal tube feeds  ?UTI -UA with mod Hgb, small leukocytes, few bacteria -urine culture- no growth  History of C. Difficile -Recent stool for c diff negative. Monitor for now while on antibiotics. - Abdomen one episode of loose stool yesterday, C. difficile by PCR weight to  be sent as no further bowel movements.   Protein-calorie malnutrition, severe -Restart tube  feeds per nutrition, continue with supplement    Code Status: FULL Family Communication: no family present at time of exam Disposition Plan: Telemetry    Consultants: Cardiology  Oncology    Procedure/Significant Events:  TEE 2/18 1/27 echocardiogram;- LVEF= 35% to 40%. Diffuse hypokinesis. - Aortic valve: moderately stenotic.  - Left atrium: mildly to moderately dilated. 2/15 CT angiogram chest PE protocol;-Post stenting of thoracoabdominal aorta through aortic bifurcation into the iliac systems bilaterally farther on RIGHT. -Additional stenting of the celiac, SMA and renal arteries. 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 -Scattered mild pulmonary atelectasis and small pleural effusions  2/15 PCXR;Cardiomegaly with mild CHF. Hazy left lower lobe airspace disease ;atelectasis vs pneumonia.    Antibiotics: Aztreonam 2/15>> 2/19 Vancomycin 2/15>> 2/18 Cefepime 2/19  DVT prophylaxis: Lovenox treatment dose   Devices IVC filter   LINES / TUBES:          Objective: VITAL SIGNS: Temp: 98 F (36.7 C) (02/19 1525) Temp Source: Oral (02/19 1525) BP: 121/91 mmHg (02/19 1525) Pulse Rate: 88 (02/19 1525) SPO2; FIO2:   Intake/Output Summary (Last 24 hours) at 05/03/14 1600 Last data filed at 05/03/14 1537  Gross per 24 hour  Intake 2936.67 ml  Output   1250 ml  Net 1686.67 ml     Exam: General: A/O 4, NAD, No acute respiratory distress Lungs: diffuse rhonchi, decreased breath sounds LLL, with wheezes RUL  Cardiovascular: Regular rate and rhythm . Murmur heard Abdomen: Nontender, nondistended, soft, bowel sounds positive, no rebound, no ascites, no appreciable mass, PEG tube in place negative sign of infection Extremities: No significant cyanosis, clubbing; bilateral mild pedal edema   Data Reviewed: Basic Metabolic  Panel:  Recent Labs Lab 04/29/14 1107 04/30/14 0900 05/02/14 0540 05/03/14 0550  NA 134* 136 140 140  K 4.1 3.6 3.6 3.8  CL 103 112 109 109  CO2 25 22 25 30   GLUCOSE 121* 89 86 108*  BUN 21 22 21 20   CREATININE 1.16 0.93 0.77 0.76  CALCIUM 9.1 8.1* 8.8 8.6  MG 1.7 1.8  --   --    Liver Function Tests:  Recent Labs Lab 04/29/14 1107 04/30/14 0900  AST 26 19  ALT 12 11  ALKPHOS 57 39  BILITOT 0.4 0.4  PROT 6.1 5.0*  ALBUMIN 2.4* 2.0*   No results for input(s): LIPASE, AMYLASE in the last 168 hours. No results for input(s): AMMONIA in the last 168 hours. CBC:  Recent Labs Lab 04/29/14 1107 04/30/14 0900 05/01/14 0340 05/02/14 0540 05/03/14 0550  WBC 5.5 4.1 3.6* 4.0 3.9*  NEUTROABS 4.2 3.1  --   --   --   HGB 10.3* 8.1* 8.8* 9.3* 8.8*  HCT 30.7* 25.1* 27.7* 29.6* 28.0*  MCV 82.5 84.8 84.7 87.1 84.8  PLT 114* 88* 97* 128* 138*   Cardiac Enzymes:  Recent Labs Lab 04/30/14 0900 04/30/14 1401 04/30/14 2005  TROPONINI <0.03 <0.03 <0.03   BNP (last 3 results)  Recent Labs  04/09/14 1918  BNP 1434.7*    ProBNP (last 3 results) No results for input(s): PROBNP in the last 8760 hours.  CBG: No results for input(s): GLUCAP in the last 168 hours.  Recent Results (from the past 240 hour(s))  Blood Culture (routine x 2)     Status: None  Collection Time: 04/29/14 11:15 AM  Result Value Ref Range Status   Specimen Description BLOOD LEFT ANTECUBITAL  Final   Special Requests BOTTLES DRAWN AEROBIC AND ANAEROBIC 5CC  Final   Culture   Final    PROTEUS MIRABILIS Note: SUSCEPTIBILITIES PERFORMED ON PREVIOUS CULTURE WITHIN THE LAST 5 DAYS. Note: Gram Stain Report Called to,Read Back By and Verified With: CAROL SCHEILLER RN 04/30/14 AT 14:00 BY [facility] Performed at [facility]    Report Status 05/03/2014 FINAL  Final  Blood Culture (routine x 2)     Status: None   Collection Time: 04/29/14 11:18 AM  Result Value Ref Range Status   Specimen  Description BLOOD HAND LEFT  Final   Special Requests BOTTLES DRAWN AEROBIC AND ANAEROBIC 5CC  Final   Culture   Final    PROTEUS MIRABILIS Note: Gram Stain Report Called to,Read Back By and Verified With: [place] USSERY ON 2.16.2016 AT 7:55P BY [facility] Performed at [facility]    Report Status 05/03/2014 FINAL  Final   Organism ID, Bacteria PROTEUS MIRABILIS  Final      Susceptibility   Proteus mirabilis - MIC*    AMPICILLIN <=2 SENSITIVE Sensitive     AMPICILLIN/SULBACTAM <=2 SENSITIVE Sensitive     CEFAZOLIN 8 SENSITIVE Sensitive     CEFEPIME <=1 SENSITIVE Sensitive     CEFTAZIDIME <=1 SENSITIVE Sensitive     CEFTRIAXONE <=1 SENSITIVE Sensitive     CIPROFLOXACIN >=4 RESISTANT Resistant     GENTAMICIN >=16 RESISTANT Resistant     IMIPENEM 1 SENSITIVE Sensitive     PIP/TAZO <=4 SENSITIVE Sensitive     TOBRAMYCIN 8 INTERMEDIATE Intermediate     TRIMETH/SULFA >=320 RESISTANT Resistant     * PROTEUS MIRABILIS  Urine culture     Status: None   Collection Time: 04/29/14 11:51 AM  Result Value Ref Range Status   Specimen Description URINE, CATHETERIZED  Final   Special Requests NONE  Final   Colony Count NO GROWTH Performed at [facility]   Final   Culture NO GROWTH Performed at [facility]   Final   Report Status 04/30/2014 FINAL  Final  Culture, blood (routine x 2)     Status: None (Preliminary result)   Collection Time: 05/02/14  3:40 PM  Result Value Ref Range Status   Specimen Description BLOOD RIGHT ARM  Final   Special Requests BOTTLES DRAWN AEROBIC AND ANAEROBIC 10CC  Final   Culture   Final           BLOOD CULTURE RECEIVED NO GROWTH TO DATE CULTURE WILL BE HELD FOR 5 DAYS BEFORE ISSUING A FINAL NEGATIVE REPORT Performed at [facility]    Report Status PENDING  Incomplete  Culture, blood (routine x 2)     Status: None (Preliminary result)   Collection Time: 05/02/14  3:50 PM  Result Value Ref Range Status   Specimen Description BLOOD  RIGHT HAND  Final   Special Requests BOTTLES DRAWN AEROBIC ONLY 10CC  Final   Culture   Final           BLOOD CULTURE RECEIVED NO GROWTH TO DATE CULTURE WILL BE HELD FOR 5 DAYS BEFORE ISSUING A FINAL NEGATIVE REPORT Performed at [facility]    Report Status PENDING  Incomplete     Studies:  Recent x-ray studies have been reviewed in detail by the Attending Physician  Scheduled Meds:  Scheduled Meds: . antiseptic oral rinse  7 mL  Mouth Rinse BID  . ceFEPime (MAXIPIME) IV  2 g Intravenous Q12H  . enoxaparin (LOVENOX) injection  40 mg Subcutaneous Q24H  . feeding supplement (JEVITY 1.2 CAL)  1,000 mL Per Tube Q24H  . feeding supplement (PRO-STAT SUGAR FREE 64)  30 mL Oral TID WC  . lactulose  10 g Oral Daily  . magnesium oxide  200 mg Oral Daily  . methylphenidate  5 mg Oral Daily  . mirtazapine  15 mg Oral QHS  . simvastatin  20 mg Oral Daily    Time spent on care of this patient: 30 mins   Samba Cumba , MD  [facility] Office  [phone] Pager [phone]  On-Call/Text Page:      [email or web address]      password [email or web address]  If 7PM-7AM, please contact night-coverage [email or web address] Password [email or web address] 05/03/2014, 4:00 PM   LOS: 4 days

## 2014-05-03 NOTE — Progress Notes (Signed)
VASCULAR LAB PRELIMINARY  PRELIMINARY  PRELIMINARY  PRELIMINARY  Left lower extremity venous duplex completed.    Preliminary report:  Left:  No evidence of acute DVT.  Chronic DVT noted in the CFV, FV, and pop v.    Matthewjames Petrasek, RVT 05/03/2014, 10:40 AM

## 2014-05-03 NOTE — Progress Notes (Signed)
ANTIBIOTIC CONSULT NOTE - FOLLOW UP  Pharmacy Consult for  Indication: bacteremia  Allergies  Allergen Reactions  . Atorvastatin Other (See Comments)    Causes constipation  . Penicillins Other (See Comments)    Other reaction(s): RASH HIVES     Patient Measurements: Height: 6\' 2"  (188 cm) Weight: 214 lb 11.7 oz (97.4 kg) IBW/kg (Calculated) : 82.2  Vital Signs: Temp: 98.3 F (36.8 C) (02/19 0417) Temp Source: Oral (02/19 0417) BP: 144/89 mmHg (02/19 0417) Pulse Rate: 83 (02/19 0417)  Labs:  Recent Labs  05/01/14 0340 05/02/14 0540 05/03/14 0550  WBC 3.6* 4.0 3.9*  HGB 8.8* 9.3* 8.8*  PLT 97* 128* 138*  CREATININE  --  0.77 0.76   Estimated Creatinine Clearance: 82.8 mL/min (by C-G formula based on Cr of 0.76). No results for input(s): VANCOTROUGH, VANCOPEAK, VANCORANDOM, GENTTROUGH, GENTPEAK, GENTRANDOM, TOBRATROUGH, TOBRAPEAK, TOBRARND, AMIKACINPEAK, AMIKACINTROU, AMIKACIN in the last 72 hours.   Microbiology:   Anti-infectives    Start     Dose/Rate Route Frequency Ordered Stop   05/03/14 1000  ceFEPIme (MAXIPIME) 2 g in dextrose 5 % 50 mL IVPB     2 g 100 mL/hr over 30 Minutes Intravenous Every 12 hours 05/03/14 0947     04/30/14 1200  vancomycin (VANCOCIN) 1,250 mg in sodium chloride 0.9 % 250 mL IVPB  Status:  Discontinued     1,250 mg 166.7 mL/hr over 90 Minutes Intravenous Every 24 hours 04/29/14 1133 05/02/14 1514   04/29/14 1500  aztreonam (AZACTAM) 2 g in dextrose 5 % 50 mL IVPB  Status:  Discontinued     2 g 100 mL/hr over 30 Minutes Intravenous Every 8 hours 04/29/14 1455 05/03/14 0902   04/29/14 1200  ceFEPIme (MAXIPIME) 2 g in dextrose 5 % 50 mL IVPB  Status:  Discontinued     2 g 100 mL/hr over 30 Minutes Intravenous Every 24 hours 04/29/14 1133 04/29/14 1450   04/29/14 1115  vancomycin (VANCOCIN) 2,000 mg in sodium chloride 0.9 % 500 mL IVPB     2,000 mg 250 mL/hr over 120 Minutes Intravenous  Once 04/29/14 1107 04/29/14 1318       Assessment: 81 yo male with hx pseudomonas endovascular aortic graft infection, on long term suppressive levofloxacin, but then developed proteus bacteremia 12/15.  Was treated with Cipro, but now 2/15 Blood cultures growing proteus that is resistant to Cipro.  Pharmacy asked to begin cefepime.  Vanc2/15 > 2/18 Cefepime 2/15 x 1 dose Azactam 2/15 > 2/19 Cefepime 2/19 >  2/15 BCx > Proteus mirabilis - now resistant to Cipro/gent/septra 2/15 urine - neg FINAL 2/15 sputum - no sample yet 2/18 BCx >  2/19 C.diff >  Goal of Therapy:  Vancomycin trough level 15-20 mcg/ml  Plan:  1. Cefepime 2g IV q 12 hrs. 2. F/u C.diff results.  Uvaldo Rising, BCPS  Clinical Pharmacist Pager [phone]  05/03/2014 10:11 AM

## 2014-05-04 DIAGNOSIS — I10 Essential (primary) hypertension: Secondary | ICD-10-CM

## 2014-05-04 LAB — CBC
HEMATOCRIT: 27.6 % — AB (ref 39.0–52.0)
HEMOGLOBIN: 8.8 g/dL — AB (ref 13.0–17.0)
MCH: 26.7 pg (ref 26.0–34.0)
MCHC: 31.9 g/dL (ref 30.0–36.0)
MCV: 83.6 fL (ref 78.0–100.0)
Platelets: 140 10*3/uL — ABNORMAL LOW (ref 150–400)
RBC: 3.3 MIL/uL — AB (ref 4.22–5.81)
RDW: 17.4 % — ABNORMAL HIGH (ref 11.5–15.5)
WBC: 3.3 10*3/uL — ABNORMAL LOW (ref 4.0–10.5)

## 2014-05-04 LAB — BASIC METABOLIC PANEL
Anion gap: 6 (ref 5–15)
BUN: 16 mg/dL (ref 6–23)
CALCIUM: 8.4 mg/dL (ref 8.4–10.5)
CO2: 24 mmol/L (ref 19–32)
Chloride: 108 mmol/L (ref 96–112)
Creatinine, Ser: 0.78 mg/dL (ref 0.50–1.35)
GFR calc non Af Amer: 82 mL/min — ABNORMAL LOW (ref >90)
GLUCOSE: 103 mg/dL — AB (ref 70–99)
POTASSIUM: 3.8 mmol/L (ref 3.5–5.1)
SODIUM: 138 mmol/L (ref 135–145)

## 2014-05-04 NOTE — Progress Notes (Signed)
SUBJECTIVE:  No complaints  OBJECTIVE:   Vitals:   Filed Vitals:   05/03/14 1525 05/03/14 2036 05/04/14 0500 05/04/14 0527  BP: 121/91 156/76  127/63  Pulse: 88 84  87  Temp: 98 F (36.7 C) 98.8 F (37.1 C)  99.3 F (37.4 C)  TempSrc: Oral Oral  Oral  Resp: 19 18  18   Height:      Weight:   207 lb 10.8 oz (94.2 kg)   SpO2: 97% 98%  91%   I&O's:   Intake/Output Summary (Last 24 hours) at 05/04/14 0849 Last data filed at 05/04/14 0600  Gross per 24 hour  Intake    340 ml  Output   1310 ml  Net   -970 ml   TELEMETRY: Reviewed telemetry pt in NSR:     PHYSICAL EXAM General: Well developed, well nourished, in no acute distress  Lungs:   Clear bilaterally to auscultation and percussion. Heart:   HRRR S1 S2 Pulses are 2+ & equal.  Extremities:   No clubbing, cyanosis or edema.  DP +1    LABS: Basic Metabolic Panel:  Recent Labs  05/03/14 0550 05/04/14 0447  NA 140 138  K 3.8 3.8  CL 109 108  CO2 30 24  GLUCOSE 108* 103*  BUN 20 16  CREATININE 0.76 0.78  CALCIUM 8.6 8.4   Liver Function Tests: No results for input(s): AST, ALT, ALKPHOS, BILITOT, PROT, ALBUMIN in the last 72 hours. No results for input(s): LIPASE, AMYLASE in the last 72 hours. CBC:  Recent Labs  05/03/14 0550 05/04/14 0447  WBC 3.9* 3.3*  HGB 8.8* 8.8*  HCT 28.0* 27.6*  MCV 84.8 83.6  PLT 138* 140*   Cardiac Enzymes: No results for input(s): CKTOTAL, CKMB, CKMBINDEX, TROPONINI in the last 72 hours. BNP: Invalid input(s): POCBNP D-Dimer:  Recent Labs  05/01/14 1830  DDIMER 9.95*   Hemoglobin A1C: No results for input(s): HGBA1C in the last 72 hours. Fasting Lipid Panel: No results for input(s): CHOL, HDL, LDLCALC, TRIG, CHOLHDL, LDLDIRECT in the last 72 hours. Thyroid Function Tests: No results for input(s): TSH, T4TOTAL, T3FREE, THYROIDAB in the last 72 hours.  Invalid input(s): FREET3 Anemia Panel:  Recent Labs  05/01/14 1830  VITAMINB12 482  FOLATE 14.5    FERRITIN 163  TIBC 189*  IRON 15*  RETICCTPCT 1.6   Coag Panel:   Lab Results  Component Value Date   INR 1.25 05/01/2014   INR 1.38 04/17/2014   INR 1.21 04/10/2014   PROTIME 30.0* 06/29/2013   PROTIME 27.6* 06/08/2013   PROTIME 28.8* 05/29/2013    RADIOLOGY: Dg Chest Port 1 View  04/29/2014   CLINICAL DATA:  Elevated temperature.  Sepsis.  Hypertension  EXAM: PORTABLE CHEST - 1 VIEW  COMPARISON:  04/19/2014  FINDINGS: Elevated left hemidiaphragm. Mild bilateral interstitial prominence. Hazy left lower lobe airspace disease. No pleural effusion or pneumothorax. Stable cardiomediastinal silhouette. Descending thoracic aortic stent graft. No acute osseous abnormality.  IMPRESSION: Cardiomegaly with mild CHF. Hazy left lower lobe airspace disease which may reflect atelectasis versus pneumonia.   Electronically Signed   By: Kathreen Devoid   On: 04/29/2014 11:26   Dg Chest Port 1 View  04/19/2014   CLINICAL DATA:  Pulmonary infiltrates.  EXAM: PORTABLE CHEST - 1 VIEW  COMPARISON:  04/18/2014.  FINDINGS: Persistent severe cardiomegaly with bilateral pulmonary infiltrates and right pleural effusion consistent with congestive heart failure appear underlying basilar pneumonia cannot be excluded. Aortic stent graft in stable  position. No pneumothorax.  IMPRESSION: 1. Persistent severe cardiomegaly bilateral pulmonary alveolar infiltrates and small right pleural effusion consistent with congestive heart failure bibasilar pneumonia cannot be excluded. 2. Aortic stent graft in stable position.   Electronically Signed   By: Marcello Moores  Register   On: 04/19/2014 07:26   Dg Chest Port 1 View  04/18/2014   CLINICAL DATA:  Bilateral airspace disease, history of thoracic aortic aneurysm treated with stent graft  EXAM: PORTABLE CHEST - 1 VIEW  COMPARISON:  Portable chest x-ray of April 17, 2014  FINDINGS: The lungs are better inflated today. The interstitial markings remain increased. The pulmonary vascularity  remains engorged. The cardiopericardial silhouette remains enlarged. There are small bilateral pleural effusions layering posteriorly. The bony thorax is unremarkable.  IMPRESSION: Persistent findings of congestive heart failure with pulmonary interstitial edema and small bilateral pleural effusions. Bibasilar subsegmental atelectasis or pneumonia is unchanged.   Electronically Signed   By: David  Martinique   On: 04/18/2014 07:31   Dg Chest Port 1 View  04/17/2014   CLINICAL DATA:  Fever.  EXAM: PORTABLE CHEST - 1 VIEW  COMPARISON:  04/09/2014  FINDINGS: Cardiomediastinal contours with aortic stent graft in place, unchanged from prior exam. Mildly improved right basilar opacity with residual atelectasis. Unchanged atelectasis at the left lung base. No large pleural effusion or pneumothorax. Osseous structures are stable.  IMPRESSION: Improved right basilar aeration. No new consolidation to suggest progressive or new pneumonia.   Electronically Signed   By: Jeb Levering M.D.   On: 04/17/2014 04:15   Dg Chest Port 1 View  04/09/2014   CLINICAL DATA:  Altered mental status  EXAM: PORTABLE CHEST - 1 VIEW  COMPARISON:  Portable exam 1801 hr compared to 03/24/2014  FINDINGS: Atherosclerotic calcification aorta with prior aortic stenting.  Stable heart size and mediastinal contours.  Increased LEFT basilar atelectasis versus infiltrate.  Mild persistent opacity in RIGHT lower lobe little changed.  Upper lungs clear.  No gross pleural effusion or pneumothorax.  IMPRESSION: Persistent RIGHT basilar opacity with increased atelectasis versus infiltrate in LEFT lower lobe.   Electronically Signed   By: Lavonia Dana M.D.   On: 04/09/2014 18:16   Ct Angio Chest Aortic Dissect W &/or W/o  04/29/2014   CLINICAL DATA:  Thoracoabdominal aortic aneurysm post stenting, recurrent graft infection, fever, hypotension, generalized abdominal pain, sepsis  EXAM: CT ANGIOGRAPHY CHEST, ABDOMEN AND PELVIS  CTA ABDOMEN AND PELVIS WITHOUT  AND WITH CONTRAST  TECHNIQUE: Multidetector CT imaging through the chest, abdomen and pelvis was performed using the standard protocol during bolus administration of intravenous contrast. Multiplanar reconstructed images and MIPs were obtained and reviewed to evaluate the vascular anatomy.  CONTRAST:  172mL OMNIPAQUE IOHEXOL 350 MG/ML SOLN IV  COMPARISON:  02/26/2014  FINDINGS: CTA CHEST FINDINGS  Extensive atherosclerotic calcifications aorta, coronary arteries and proximal great vessels.  Endograft identified beginning at proximal descending thoracic aorta extending into abdomen.  No high attenuation mural crescent identified on precontrast imaging.  Descending thoracic aorta stable in size.  Aneurysmal dilatation of the ascending thoracic aorta measuring 4.7 cm transverse immediately proximal to aortic arch, 4.5 cm on previous exam.  Minimal low-attenuation pericardial effusion.  No evidence of dissection or periaortic hemorrhage.  Scattered atelectasis greater in lower lobes and lateral RIGHT upper lobe.  Small BILATERAL pleural effusions.  Minimal patchy infiltrate in lingula.  No thoracic adenopathy or pneumothorax.  Bones demineralized.  Review of the MIP images confirms the above findings.  CTA ABDOMEN AND  PELVIS FINDINGS  Aortic endograft extends from distal aspect of aortic arch through abdominal aorta and aortic bifurcation into the LEFT common iliac artery and RIGHT internal and external iliac arteries.  Aneurysmal dilatation of the proximal abdominal aorta beginning below the diaphragmatic hiatus and extending to the SMA origin, 6.9 x 6.4 cm image 121 previously 6.9 x 6.1 cm.  Second fusiform focus of aortic dilatation at distal abdominal aorta at bifurcation, 4.1 x 4.6 cm image 200, previously 4.4 x 3.9 cm.  Third area of aneurysmal dilatation located at RIGHT common iliac artery, 4.3 cm diameter image 223, previously 4.4 cm.  Several small foci of calcification are seen within the aneurysm thrombus  without definite evidence of endoleak or acute perianeurysmal hemorrhage.  Infiltration of periaortic fat anterior to the proximal abdominal aortic aneurysm at the level of the celiac artery appears unchanged, question related to prior aneurysm rupture/hemorrhage, perianeurysmal fibrosis or infection.  Small pseudoaneurysm identified at the celiac stent on 01/03/2014 exam is not visualized on the current exam nor on the previous study of 02/26/2014 due to interval intervention.  No discrete fluid collection to suggest abscess is identified.  LEFT renal cyst stable.  Liver, spleen, atrophic pancreas, kidneys, and adrenal glands otherwise unremarkable.  Gastrostomy tube present.  Bowel loops suboptimally assessed due to lack of GI contrast, no gross abnormality seen.  No additional mass, adenopathy, free fluid, or free air.  IVC filter noted.  Degenerative disc and facet disease changes thoracolumbar spine.  Review of the MIP images confirms the above findings.  IMPRESSION:  Post stenting of thoracoabdominal aorta through aortic bifurcation into the iliac systems bilaterally farther on RIGHT.  Additional stenting of the celiac, SMA and renal arteries.  Areas of aortic dilatation appear fairly stable in sizes since previous exam.  Perianeurysmal infiltration centered at the level of the celiac artery appears little changed from the previous exam favor related to prior hemorrhage and/or fibrosis, less likely infection.  No discrete abscess collection is identified though graft infection cannot be excluded by CT.  Scattered mild pulmonary atelectasis and small pleural effusions as above.   Electronically Signed   By: Lavonia Dana M.D.   On: 04/29/2014 15:25   Ct Cta Abd/pel W/cm &/or W/o Cm  04/29/2014   CLINICAL DATA:  Thoracoabdominal aortic aneurysm post stenting, recurrent graft infection, fever, hypotension, generalized abdominal pain, sepsis  EXAM: CT ANGIOGRAPHY CHEST, ABDOMEN AND PELVIS  CTA ABDOMEN AND PELVIS  WITHOUT AND WITH CONTRAST  TECHNIQUE: Multidetector CT imaging through the chest, abdomen and pelvis was performed using the standard protocol during bolus administration of intravenous contrast. Multiplanar reconstructed images and MIPs were obtained and reviewed to evaluate the vascular anatomy.  CONTRAST:  156mL OMNIPAQUE IOHEXOL 350 MG/ML SOLN IV  COMPARISON:  02/26/2014  FINDINGS: CTA CHEST FINDINGS  Extensive atherosclerotic calcifications aorta, coronary arteries and proximal great vessels.  Endograft identified beginning at proximal descending thoracic aorta extending into abdomen.  No high attenuation mural crescent identified on precontrast imaging.  Descending thoracic aorta stable in size.  Aneurysmal dilatation of the ascending thoracic aorta measuring 4.7 cm transverse immediately proximal to aortic arch, 4.5 cm on previous exam.  Minimal low-attenuation pericardial effusion.  No evidence of dissection or periaortic hemorrhage.  Scattered atelectasis greater in lower lobes and lateral RIGHT upper lobe.  Small BILATERAL pleural effusions.  Minimal patchy infiltrate in lingula.  No thoracic adenopathy or pneumothorax.  Bones demineralized.  Review of the MIP images confirms the above findings.  CTA  ABDOMEN AND PELVIS FINDINGS  Aortic endograft extends from distal aspect of aortic arch through abdominal aorta and aortic bifurcation into the LEFT common iliac artery and RIGHT internal and external iliac arteries.  Aneurysmal dilatation of the proximal abdominal aorta beginning below the diaphragmatic hiatus and extending to the SMA origin, 6.9 x 6.4 cm image 121 previously 6.9 x 6.1 cm.  Second fusiform focus of aortic dilatation at distal abdominal aorta at bifurcation, 4.1 x 4.6 cm image 200, previously 4.4 x 3.9 cm.  Third area of aneurysmal dilatation located at RIGHT common iliac artery, 4.3 cm diameter image 223, previously 4.4 cm.  Several small foci of calcification are seen within the aneurysm  thrombus without definite evidence of endoleak or acute perianeurysmal hemorrhage.  Infiltration of periaortic fat anterior to the proximal abdominal aortic aneurysm at the level of the celiac artery appears unchanged, question related to prior aneurysm rupture/hemorrhage, perianeurysmal fibrosis or infection.  Small pseudoaneurysm identified at the celiac stent on 01/03/2014 exam is not visualized on the current exam nor on the previous study of 02/26/2014 due to interval intervention.  No discrete fluid collection to suggest abscess is identified.  LEFT renal cyst stable.  Liver, spleen, atrophic pancreas, kidneys, and adrenal glands otherwise unremarkable.  Gastrostomy tube present.  Bowel loops suboptimally assessed due to lack of GI contrast, no gross abnormality seen.  No additional mass, adenopathy, free fluid, or free air.  IVC filter noted.  Degenerative disc and facet disease changes thoracolumbar spine.  Review of the MIP images confirms the above findings.  IMPRESSION:  Post stenting of thoracoabdominal aorta through aortic bifurcation into the iliac systems bilaterally farther on RIGHT.  Additional stenting of the celiac, SMA and renal arteries.  Areas of aortic dilatation appear fairly stable in sizes since previous exam.  Perianeurysmal infiltration centered at the level of the celiac artery appears little changed from the previous exam favor related to prior hemorrhage and/or fibrosis, less likely infection.  No discrete abscess collection is identified though graft infection cannot be excluded by CT.  Scattered mild pulmonary atelectasis and small pleural effusions as above.   Electronically Signed   By: Lavonia Dana M.D.   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   EXAM: CTA ABDOMEN AND PELVIS WITHOUT CONTRAST  TECHNIQUE: Multidetector CT imaging of the abdomen and pelvis was performed using the standard protocol during bolus administration of intravenous contrast. Multiplanar reconstructed images and MIPs were obtained and reviewed to evaluate the vascular anatomy.  CONTRAST:  151mL OMNIPAQUE IOHEXOL 350 MG/ML SOLN  COMPARISON:  CTA of the chest, abdomen and pelvis on 02/26/2014 and multiple other imaging studies.  FINDINGS: Complex thoracoabdominal demonstrated endograft placement again noted. Only the lower portion of the descending thoracic component is visualized on the study. The endograft shows stable patency and positioning. Fenestration with stenting of the celiac axis, superior mesenteric artery, bilateral single renal arteries and graft extensions into the right internal and external iliac arteries show normal patency.  Stable soft tissue thickening remains around the celiac axis with stent extension into the common hepatic artery. There no longer is contrast opacification of a separate pseudoaneurysm as depicted by CTA October, 2015. No new pseudoaneurysms identified. No evidence of abnormal air surrounding the endograft. No abnormal fluid collections.  On the arterial phase of imaging, no obvious endoleak is identified. A delayed sequence was not obtained on the current study. Native right common iliac aneurysm sac remains thrombosed. Distal left common iliac limb and both right internal and external iliac limb landing zones show normal patency and apposition. Native iliac and common femoral arteries showed no significant disease.  The visualized lung bases show small bilateral pleural effusions and bibasilar atelectasis. The liver, gallbladder, spleen and adrenal glands are unremarkable. There is some motion artifact on the study and the arms were also along the side of the body. There may be some subtle stranding in the fat adjacent to the tail of the pancreas.  Correlation suggested with any clinical evidence of acute pancreatitis.  Bowel is unremarkable with no evidence of obstruction or thickening. No free air is identified. There is a gastrostomy tube positioned in the body of the stomach. An IVC filter is in place in the infrarenal IVC. The bladder is unremarkable. No hernias are identified. Bony structures show mild spondylosis of the lower thoracic and lumbar spine.  Review of the MIP images confirms the above findings.  IMPRESSION: 1. No further opacification of celiac axis pseudoaneurysm following extend extension into the common hepatic artery. There remains soft tissue thickening around the celiac axis at the level of prior mycotic pseudoaneurysm. No abnormal fluid collection is identified. 2. Stable patency of complex fenestrated thoracoabdominal endograft extending into native iliac arteries bilaterally. 3. Small bilateral pleural effusions. 4. Motion artifact versus potential subtle inflammatory stranding adjacent to the tail of the pancreas. Recommend correlation with any clinical evidence of pancreatitis.   Electronically Signed   By: Aletta Edouard M.D.   On: 04/19/2014 14:00   Assessment/Plan:  81 year old gentleman is status post repair of abdominal aortic aneurysm in 2014. 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 He has been treated with prolonged antibiotics. He has a history of Pseudomonas bacteremia. He was most recently treated with outpatient high-dose oral ciprofloxacin 750 mg twice a day. This has subsequently been stopped because of prolonged QTc interval. He was admitted with fever 04/29/14. TEE done did not show vegetation.   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 Recurrent sepsis - s/p TEE no vegetation noted. Abx per primary team. Significant improvement.   2. S/p thoracoabdominal aortic aneurysm repair at [facility] with subsequent graft infection causing chronic endovascular leak  3.  Moderate aortic stenosis  4. Prolonged QT on EKG: prior Cipro stopped  - QT/QTC minimally improved, on 2/16 QT/QTc 554/610 , on 2/17 486/578 ms, today 498/567y,2/20  446/571ms, no VT on telemetry. Continue to avoid any drugs which would prolong the QT interval.  5. Previous PEG tube but now on a dysphagia 3 diet. Patient denies difficulty swallowing.  6. Chronic systolic congestive heart failure, clinically improved after Lasix  7. Anemia and thrombocytopenia: followed by oncology, felt related to recurrent infection. Lovenox stopped due to risk of bleeding. Improving  8. Elevated d-dimer: per primary team    Sueanne Margarita, MD  05/04/2014  8:49 AM

## 2014-05-04 NOTE — Progress Notes (Addendum)
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 (Last course of treatment was on 03/07/2014), Pseudomonas bacteremia, dysphagia with recurrent aspiration requiring PEG tube placement, DVT s/p IVC filter placement, hypertension, atrial flutter, dyslipidemia, systolic CHF (EF of 76%), with recurrent hospitalizations related to PNA and/or sepsis (this is 6th hospitalization in last 3 months).  Patient was recently hospitalized for sepsis and discharged  on high-dose oral ciprofloxacin (750 mg twice daily) with plan on palliative care follow-up at SNF. As per the son,  When visiting his father morning of admission, he found him with fever and chills. Patient reports  an episode of nonbloody vomiting yesterday morning, and having productive cough for the last 2-3 days. Patient has been having small amount of food at the facility besides being on PEG feeds during the night. He denies headache, dizziness, chest pain, palpitations, SOB, abdominal pain, bowel or urinary symptoms.He is nonambulatory at baseline. In ED, workup revealed T 104.6, pulse 125, RR 32, BP 81/51, and lactic acid was elevated at 2.5. UA was cloudy with mod Hgb and small leukocyte. CXR with possible left LL infilterate. EKG with sinus tachycardia  At 125, and anterior T wave inversion, prolonged QTc 591.  He was placed on IV Vancomycin and Cefepime, given 3L NS bolus, with improvement in BP. Pt was then admitted with sepsis for further workup.  He was later transitioned to IV vancomycin and Azactam, blood cultures growing gram-negative rods 2(Proteus)  . Patient hadTEE on 2/18, with no evidence of vegetation.  HPI/Subjective: Patient denies any chest pain, cough, shortness of  breath, fever or chills,  Assessment/Plan:  Sepsis/ bacteremia (PROTEUS MIRABILIS ) -Initially with empiric vancomycin and aztreonam, stopped IV vancomycin 2/18, given blood culture growing only gram-negative rods, aztreonam switched  to cefepime on 2/19 by ID . Will need to continue on IV cefepime through the weekend then to be reevaluated by ID. -High dose Cipro on recent discharge DC due to QT prolongation  - TEE 2/18 showing no vegetation -blood cultures growing PROTEUS MIRABILIS  ,this is most likely related to his recurrent AAA graft infection. -urine strep/ legionella antigen negative - Repeat blood cultures 2/18 no growth to date.  Systolic Congestive heart failure -Strict in and out -Daily weight -Patient's PCXR from 2/15 shows pulmonary edema; Lasix 40 mg 1   Prolonged QTC -Increasing QTC on repeat EKG 2/16 -hold ciprofloxacin, Pepcid, Toviaz, stopped Remeron. -Cards following -Trop x3  negative -Mag normal - Continue with telemetry monitor  DVT of lower extremity (Feb 2015) -s/p IVC filter-not on anticoagulation . - Not clear when Xarelto was stopped. - Oncology consult appreciated, no need for further anticoagulation as patient has IVC filter as per Dr. Marin Olp, continue with prophylaxis dose.   Leukopenia/Anemia/Thrombocytopenia -Oncology following   AAA (abdominal aortic aneurysm) -Stable per CT scan 2/15  Chronic dysphagia -Patient with history of dysphagia, and dysphagia 3 with thin liquid -Nutrition consult for PEG tube feedings  - Continue with nocturnal tube feeds  ?UTI -UA with mod Hgb, small leukocytes, few bacteria -urine culture- no growth  History of C. Difficile -Recent stool for c diff negative. Monitor for now while on antibiotics.    Protein-calorie malnutrition, severe -Restart tube feeds per nutrition, continue with supplement    Code Status: Postural  Family Communication: Spoke with daughter yesterday at bedside Disposition:  Family wants to take patient home once stable.   Consultants: Cardiology  Oncology  Infectious disease    Procedure/Significant Events:  TEE 2/18 1/27 echocardiogram;- LVEF= 35% to 40%. Diffuse hypokinesis. - Aortic valve: moderately stenotic.  - Left atrium: mildly to moderately dilated. 2/15 CT angiogram chest PE protocol;-Post stenting of thoracoabdominal aorta through aortic bifurcation into the iliac systems bilaterally farther on RIGHT. -Additional stenting of the celiac, SMA and renal arteries. 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 -Scattered mild pulmonary atelectasis and small pleural effusions  2/15 PCXR;Cardiomegaly with mild CHF. Hazy left lower lobe airspace disease ;atelectasis vs pneumonia.    Antibiotics: Aztreonam 2/15>> 2/19 Vancomycin 2/15>> 2/18 Cefepime 2/19  DVT prophylaxis: Lovenox t   Devices IVC filter   LINES / TUBES:          Objective: VITAL SIGNS: Temp: 99.3 F (37.4 C) (02/20 0527) Temp Source: Oral (02/20 0527) BP: 127/63 mmHg (02/20 0527) Pulse Rate: 87 (02/20 0527) SPO2; FIO2:   Intake/Output Summary (Last 24 hours) at 05/04/14 1128 Last data filed at 05/04/14 0600  Gross per 24 hour  Intake    340 ml  Output   1310 ml  Net   -970 ml     Exam: General: A/O 4, NAD, No acute respiratory distress Lungs: diffuse rhonchi, decreased breath sounds LLL, with wheezes RUL  Cardiovascular: Regular rate and rhythm . Murmur heard Abdomen: Nontender, nondistended, soft, bowel sounds positive, no rebound, no ascites, no appreciable mass, PEG tube in place negative sign of infection Extremities: No significant cyanosis, clubbing; bilateral mild pedal edema   Data Reviewed: Basic Metabolic Panel:  Recent Labs Lab 04/29/14 1107 04/30/14 0900 05/02/14 0540 05/03/14 0550 05/04/14 0447   NA 134* 136 140 140 138  K 4.1 3.6 3.6 3.8 3.8  CL 103 112 109 109 108  CO2 25 22 25 30 24   GLUCOSE 121* 89 86 108* 103*  BUN 21 22 21 20 16   CREATININE 1.16 0.93 0.77 0.76 0.78  CALCIUM 9.1 8.1* 8.8 8.6 8.4  MG 1.7 1.8  --   --   --    Liver Function Tests:  Recent Labs Lab 04/29/14 1107 04/30/14 0900  AST 26 19  ALT 12 11  ALKPHOS 57 39  BILITOT 0.4 0.4  PROT 6.1 5.0*  ALBUMIN 2.4* 2.0*   No results for input(s): LIPASE, AMYLASE in the last 168 hours. No results for input(s): AMMONIA in the last 168 hours. CBC:  Recent Labs Lab 04/29/14 1107 04/30/14 0900 05/01/14 0340 05/02/14 0540 05/03/14 0550 05/04/14 0447  WBC 5.5 4.1 3.6* 4.0 3.9* 3.3*  NEUTROABS 4.2 3.1  --   --   --   --   HGB 10.3* 8.1* 8.8* 9.3* 8.8* 8.8*  HCT 30.7* 25.1* 27.7* 29.6* 28.0* 27.6*  MCV 82.5 84.8 84.7 87.1 84.8 83.6  PLT 114* 88* 97* 128* 138* 140*   Cardiac Enzymes:  Recent Labs Lab 04/30/14 0900 04/30/14 1401 04/30/14 2005  TROPONINI <0.03 <0.03 <0.03   BNP (last 3 results)  Recent Labs  04/09/14 1918  BNP 1434.7*    ProBNP (last 3 results) No results for input(s): PROBNP in the last 8760 hours.  CBG: No results for input(s): GLUCAP in the last 168 hours.  Recent Results (from the past 240 hour(s))  Blood Culture (routine  x 2)     Status: None   Collection Time: 04/29/14 11:15 AM  Result Value Ref Range Status   Specimen Description BLOOD LEFT ANTECUBITAL  Final   Special Requests BOTTLES DRAWN AEROBIC AND ANAEROBIC 5CC  Final   Culture   Final    PROTEUS MIRABILIS Note: SUSCEPTIBILITIES PERFORMED ON PREVIOUS CULTURE WITHIN THE LAST 5 DAYS. Note: Gram Stain Report Called to,Read Back By and Verified With: CAROL SCHEILLER RN 04/30/14 AT 14:00 BY [facility] Performed at [facility]    Report Status 05/03/2014 FINAL  Final  Blood Culture (routine x 2)     Status: None   Collection Time: 04/29/14 11:18 AM  Result Value Ref Range Status   Specimen  Description BLOOD HAND LEFT  Final   Special Requests BOTTLES DRAWN AEROBIC AND ANAEROBIC 5CC  Final   Culture   Final    PROTEUS MIRABILIS Note: Gram Stain Report Called to,Read Back By and Verified With: [place] USSERY ON 2.16.2016 AT 7:55P BY [facility] Performed at [facility]    Report Status 05/03/2014 FINAL  Final   Organism ID, Bacteria PROTEUS MIRABILIS  Final      Susceptibility   Proteus mirabilis - MIC*    AMPICILLIN <=2 SENSITIVE Sensitive     AMPICILLIN/SULBACTAM <=2 SENSITIVE Sensitive     CEFAZOLIN 8 SENSITIVE Sensitive     CEFEPIME <=1 SENSITIVE Sensitive     CEFTAZIDIME <=1 SENSITIVE Sensitive     CEFTRIAXONE <=1 SENSITIVE Sensitive     CIPROFLOXACIN >=4 RESISTANT Resistant     GENTAMICIN >=16 RESISTANT Resistant     IMIPENEM 1 SENSITIVE Sensitive     PIP/TAZO <=4 SENSITIVE Sensitive     TOBRAMYCIN 8 INTERMEDIATE Intermediate     TRIMETH/SULFA >=320 RESISTANT Resistant     * PROTEUS MIRABILIS  Urine culture     Status: None   Collection Time: 04/29/14 11:51 AM  Result Value Ref Range Status   Specimen Description URINE, CATHETERIZED  Final   Special Requests NONE  Final   Colony Count NO GROWTH Performed at [facility]   Final   Culture NO GROWTH Performed at [facility]   Final   Report Status 04/30/2014 FINAL  Final  Culture, blood (routine x 2)     Status: None (Preliminary result)   Collection Time: 05/02/14  3:40 PM  Result Value Ref Range Status   Specimen Description BLOOD RIGHT ARM  Final   Special Requests BOTTLES DRAWN AEROBIC AND ANAEROBIC 10CC  Final   Culture   Final           BLOOD CULTURE RECEIVED NO GROWTH TO DATE CULTURE WILL BE HELD FOR 5 DAYS BEFORE ISSUING A FINAL NEGATIVE REPORT Performed at [facility]    Report Status PENDING  Incomplete  Culture, blood (routine x 2)     Status: None (Preliminary result)   Collection Time: 05/02/14  3:50 PM  Result Value Ref Range Status   Specimen Description BLOOD  RIGHT HAND  Final   Special Requests BOTTLES DRAWN AEROBIC ONLY 10CC  Final   Culture   Final           BLOOD CULTURE RECEIVED NO GROWTH TO DATE CULTURE WILL BE HELD FOR 5 DAYS BEFORE ISSUING A FINAL NEGATIVE REPORT Performed at [facility]    Report Status PENDING  Incomplete     Studies:  Recent x-ray studies have been reviewed in detail by the Attending Physician  Scheduled Meds:  Scheduled Meds: . antiseptic oral rinse  7 mL Mouth Rinse BID  . ceFEPime (MAXIPIME) IV  2 g Intravenous Q12H  . enoxaparin (LOVENOX) injection  40 mg Subcutaneous Q24H  . feeding supplement (JEVITY 1.2 CAL)  1,000 mL Per Tube Q24H  . feeding supplement (PRO-STAT SUGAR FREE 64)  30 mL Oral TID WC  . lactulose  10 g Oral Daily  . magnesium oxide  200 mg Oral Daily  . methylphenidate  5 mg Oral Daily  . mirtazapine  15 mg Oral QHS  . simvastatin  20 mg Oral Daily    Time spent on care of this patient: 30 mins   Katyra Tomassetti , MD  [facility] Office  [phone] Pager [phone]  On-Call/Text Page:      [email or web address]      password [email or web address]  If 7PM-7AM, please contact night-coverage [email or web address] Password [email or web address] 05/04/2014, 11:28 AM   LOS: 5 days

## 2014-05-05 NOTE — Progress Notes (Signed)
Pt refusing to eat any meals today; pt states he feels "full" from tube feedings given over night; pt also encouraged to get up OOB to chair; pt refusing to do this at this time; will cont. To monitor.

## 2014-05-05 NOTE — Progress Notes (Signed)
[place] TEAM 1 - Stepdown/ICU TEAM Progress Note  [name] MEQ:[id number] DOB: 1929-05-10 DOA: 04/29/2014 PCP: [name], MD  Admit HPI / Brief Narrative: 81 year old male with PMH of AAA s/p repair and graft infection causing chronic endovascular leak and infection with prolonged antibiotic course. (Last course of treatment was on 03/07/2014), Pseudomonas bacteremia, dysphagia with recurrent aspiration requiring PEG tube placement, DVT s/p IVC filter placement, hypertension, atrial flutter, dyslipidemia, systolic CHF (EF of 29%), with recurrent hospitalizations related to PNA and/or sepsis (this is 6th hospitalization in last 3 months).  Patient was recently hospitalized for sepsis and discharged  on high-dose oral ciprofloxacin (750 mg twice daily) with plan on palliative care follow-up at SNF. As per the son,  When visiting his father morning of admission, he found him with fever and chills. Patient reports  an episode of nonbloody vomiting yesterday morning, and having productive cough for the last 2-3 days. Patient has been having small amount of food at the facility besides being on PEG feeds during the night. He denies headache, dizziness, chest pain, palpitations, SOB, abdominal pain, bowel or urinary symptoms.He is nonambulatory at baseline. In ED, workup revealed T 104.6, pulse 125, RR 32, BP 81/51, and lactic acid was elevated at 2.5. UA was cloudy with mod Hgb and small leukocyte. CXR with possible left LL infilterate. EKG with sinus tachycardia  At 125, and anterior T wave inversion, prolonged QTc 591.  He was placed on IV Vancomycin and Cefepime, given 3L NS bolus, with improvement in BP. Pt was then admitted with sepsis for further workup.  He was later transitioned to IV vancomycin and Azactam, blood cultures growing gram-negative rods 2(Proteus)  . Patient hadTEE on 2/18, with no evidence of vegetation.  HPI/Subjective: Patient denies any chest pain, cough, shortness of  breath, fever or chills,  Assessment/Plan:  Sepsis/ bacteremia (PROTEUS MIRABILIS ) -Initially with empiric vancomycin and aztreonam, stopped IV vancomycin 2/18, given blood culture growing only gram-negative rods, aztreonam switched  to cefepime on 2/19 by ID . Will need to continue on IV cefepime through the weekend then to be reevaluated by ID. -High dose Cipro on recent discharge DC due to QT prolongation  - TEE 2/18 showing no vegetation -blood cultures growing PROTEUS MIRABILIS  ,this is most likely related to his recurrent AAA graft infection. -urine strep/ legionella antigen negative - Repeat blood cultures 2/18 no growth to date.  Systolic Congestive heart failure -Strict in and out -Daily weight -Patient's PCXR from 2/15 shows pulmonary edema; Lasix 40 mg 1   Prolonged QTC -Increasing QTC on repeat EKG 2/16 -hold ciprofloxacin, Pepcid, Toviaz,  Remeron. -Cards following -Trop x3  negative -Mag normal - Continue with telemetry monitor  DVT of lower extremity (Feb 2015) -s/p IVC filter-not on anticoagulation . - Not clear when Xarelto was stopped. - Oncology consult appreciated, no need for further anticoagulation as patient has IVC filter as per Dr. Marin Olp, continue with prophylaxis dose.   Leukopenia/Anemia/Thrombocytopenia -Oncology following   AAA (abdominal aortic aneurysm) -Stable per CT scan 2/15  Chronic dysphagia -Patient with history of dysphagia, and dysphagia 3 with thin liquid -Nutrition consult for PEG tube feedings  - Continue with nocturnal tube feeds  ?UTI -UA with mod Hgb, small leukocytes, few bacteria -urine culture- no growth  History of C. Difficile -Recent stool for c diff negative. Monitor for now while on antibiotics.    Protein-calorie malnutrition, severe -Restart tube feeds per nutrition, continue with supplement    Code Status: Partial  Family Communication: Spoke with son at bedside Disposition: Family wants to take  patient home once stable.   Consultants: Cardiology  Oncology  Infectious disease    Procedure/Significant Events:  TEE 2/18 1/27 echocardiogram;- LVEF= 35% to 40%. Diffuse hypokinesis. - Aortic valve: moderately stenotic.  - Left atrium: mildly to moderately dilated. 2/15 CT angiogram chest PE protocol;-Post stenting of thoracoabdominal aorta through aortic bifurcation into the iliac systems bilaterally farther on RIGHT. -Additional stenting of the celiac, SMA and renal arteries. 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 -Scattered mild pulmonary atelectasis and small pleural effusions  2/15 PCXR;Cardiomegaly with mild CHF. Hazy left lower lobe airspace disease ;atelectasis vs pneumonia.    Antibiotics: Aztreonam 2/15>> 2/19 Vancomycin 2/15>> 2/18 Cefepime 2/19  DVT prophylaxis: Lovenox t   Devices IVC filter   LINES / TUBES:          Objective: VITAL SIGNS: Temp: 97.9 F (36.6 C) (02/21 0544) Temp Source: Oral (02/21 0544) BP: 135/79 mmHg (02/21 1024) Pulse Rate: 90 (02/21 1024) SPO2; FIO2:   Intake/Output Summary (Last 24 hours) at 05/05/14 1226 Last data filed at 05/05/14 0814  Gross per 24 hour  Intake    160 ml  Output    375 ml  Net   -215 ml     Exam: General: A/O 4, NAD, No acute respiratory distress Lungs: diffuse rhonchi, decreased breath sounds LLL, with wheezes RUL  Cardiovascular: Regular rate and rhythm . Murmur heard Abdomen: Nontender, nondistended, soft, bowel sounds positive, no rebound, no ascites, no appreciable mass, PEG tube in place negative sign of infection Extremities: No significant cyanosis, clubbing; bilateral mild pedal edema   Data Reviewed: Basic Metabolic Panel:  Recent Labs Lab 04/29/14 1107 04/30/14 0900 05/02/14 0540 05/03/14 0550 05/04/14 0447  NA 134* 136 140 140  138  K 4.1 3.6 3.6 3.8 3.8  CL 103 112 109 109 108  CO2 25 22 25 30 24   GLUCOSE 121* 89 86 108* 103*  BUN 21 22 21 20 16   CREATININE 1.16 0.93 0.77 0.76 0.78  CALCIUM 9.1 8.1* 8.8 8.6 8.4  MG 1.7 1.8  --   --   --    Liver Function Tests:  Recent Labs Lab 04/29/14 1107 04/30/14 0900  AST 26 19  ALT 12 11  ALKPHOS 57 39  BILITOT 0.4 0.4  PROT 6.1 5.0*  ALBUMIN 2.4* 2.0*   No results for input(s): LIPASE, AMYLASE in the last 168 hours. No results for input(s): AMMONIA in the last 168 hours. CBC:  Recent Labs Lab 04/29/14 1107 04/30/14 0900 05/01/14 0340 05/02/14 0540 05/03/14 0550 05/04/14 0447  WBC 5.5 4.1 3.6* 4.0 3.9* 3.3*  NEUTROABS 4.2 3.1  --   --   --   --   HGB 10.3* 8.1* 8.8* 9.3* 8.8* 8.8*  HCT 30.7* 25.1* 27.7* 29.6* 28.0* 27.6*  MCV 82.5 84.8 84.7 87.1 84.8 83.6  PLT 114* 88* 97* 128* 138* 140*   Cardiac Enzymes:  Recent Labs Lab 04/30/14 0900 04/30/14 1401 04/30/14 2005  TROPONINI <0.03 <0.03 <0.03   BNP (last 3 results)  Recent Labs  04/09/14 1918  BNP 1434.7*    ProBNP (last 3 results) No results for input(s): PROBNP in the last 8760 hours.  CBG: No results for input(s): GLUCAP in the last 168 hours.  Recent Results (from the past 240 hour(s))  Blood Culture (routine  x 2)     Status: None   Collection Time: 04/29/14 11:15 AM  Result Value Ref Range Status   Specimen Description BLOOD LEFT ANTECUBITAL  Final   Special Requests BOTTLES DRAWN AEROBIC AND ANAEROBIC 5CC  Final   Culture   Final    PROTEUS MIRABILIS Note: SUSCEPTIBILITIES PERFORMED ON PREVIOUS CULTURE WITHIN THE LAST 5 DAYS. Note: Gram Stain Report Called to,Read Back By and Verified With: CAROL SCHEILLER RN 04/30/14 AT 14:00 BY [facility] Performed at [facility]    Report Status 05/03/2014 FINAL  Final  Blood Culture (routine x 2)     Status: None   Collection Time: 04/29/14 11:18 AM  Result Value Ref Range Status   Specimen Description BLOOD HAND LEFT   Final   Special Requests BOTTLES DRAWN AEROBIC AND ANAEROBIC 5CC  Final   Culture   Final    PROTEUS MIRABILIS Note: Gram Stain Report Called to,Read Back By and Verified With: [place] USSERY ON 2.16.2016 AT 7:55P BY [facility] Performed at [facility]    Report Status 05/03/2014 FINAL  Final   Organism ID, Bacteria PROTEUS MIRABILIS  Final      Susceptibility   Proteus mirabilis - MIC*    AMPICILLIN <=2 SENSITIVE Sensitive     AMPICILLIN/SULBACTAM <=2 SENSITIVE Sensitive     CEFAZOLIN 8 SENSITIVE Sensitive     CEFEPIME <=1 SENSITIVE Sensitive     CEFTAZIDIME <=1 SENSITIVE Sensitive     CEFTRIAXONE <=1 SENSITIVE Sensitive     CIPROFLOXACIN >=4 RESISTANT Resistant     GENTAMICIN >=16 RESISTANT Resistant     IMIPENEM 1 SENSITIVE Sensitive     PIP/TAZO <=4 SENSITIVE Sensitive     TOBRAMYCIN 8 INTERMEDIATE Intermediate     TRIMETH/SULFA >=320 RESISTANT Resistant     * PROTEUS MIRABILIS  Urine culture     Status: None   Collection Time: 04/29/14 11:51 AM  Result Value Ref Range Status   Specimen Description URINE, CATHETERIZED  Final   Special Requests NONE  Final   Colony Count NO GROWTH Performed at [facility]   Final   Culture NO GROWTH Performed at [facility]   Final   Report Status 04/30/2014 FINAL  Final  Culture, blood (routine x 2)     Status: None (Preliminary result)   Collection Time: 05/02/14  3:40 PM  Result Value Ref Range Status   Specimen Description BLOOD RIGHT ARM  Final   Special Requests BOTTLES DRAWN AEROBIC AND ANAEROBIC 10CC  Final   Culture   Final           BLOOD CULTURE RECEIVED NO GROWTH TO DATE CULTURE WILL BE HELD FOR 5 DAYS BEFORE ISSUING A FINAL NEGATIVE REPORT Performed at [facility]    Report Status PENDING  Incomplete  Culture, blood (routine x 2)     Status: None (Preliminary result)   Collection Time: 05/02/14  3:50 PM  Result Value Ref Range Status   Specimen Description BLOOD RIGHT HAND  Final   Special  Requests BOTTLES DRAWN AEROBIC ONLY 10CC  Final   Culture   Final           BLOOD CULTURE RECEIVED NO GROWTH TO DATE CULTURE WILL BE HELD FOR 5 DAYS BEFORE ISSUING A FINAL NEGATIVE REPORT Performed at [facility]    Report Status PENDING  Incomplete     Studies:  Recent x-ray studies have been reviewed in detail by the Attending Physician  Scheduled Meds:  Scheduled Meds: . antiseptic oral rinse  7 mL Mouth Rinse BID  . ceFEPime (MAXIPIME) IV  2 g Intravenous Q12H  . enoxaparin (LOVENOX) injection  40 mg Subcutaneous Q24H  . feeding supplement (JEVITY 1.2 CAL)  1,000 mL Per Tube Q24H  . feeding supplement (PRO-STAT SUGAR FREE 64)  30 mL Oral TID WC  . lactulose  10 g Oral Daily  . magnesium oxide  200 mg Oral Daily  . methylphenidate  5 mg Oral Daily  . mirtazapine  15 mg Oral QHS  . simvastatin  20 mg Oral Daily    Time spent on care of this patient: 30 mins   Dwight Burdo , MD  [facility] Office  [phone] Pager [phone]  On-Call/Text Page:      [email or web address]      password [email or web address]  If 7PM-7AM, please contact night-coverage [email or web address] Password [facility] 05/05/2014, 12:26 PM   LOS: 6 days

## 2014-05-05 NOTE — Progress Notes (Signed)
Pt refusing to eat supper at this time; pt states as long as tube is in place and feedings are done overnight he is unable to eat; TF started for tonight; will cont. To monitor.

## 2014-05-05 NOTE — Progress Notes (Signed)
SUBJECTIVE:  No complaints  OBJECTIVE:   Vitals:   Filed Vitals:   05/04/14 0527 05/04/14 1345 05/04/14 2049 05/05/14 0544  BP: 127/63 129/84 118/82 135/78  Pulse: 87 89 87 87  Temp: 99.3 F (37.4 C) 98.9 F (37.2 C) 98.3 F (36.8 C) 97.9 F (36.6 C)  TempSrc: Oral Oral Oral Oral  Resp: 18 17 16 17   Height:      Weight:    207 lb 3.7 oz (94 kg)  SpO2: 91% 95% 94% 92%   I&O's:   Intake/Output Summary (Last 24 hours) at 05/05/14 2694 Last data filed at 05/05/14 0411  Gross per 24 hour  Intake    100 ml  Output    450 ml  Net   -350 ml   TELEMETRY: Reviewed telemetry pt in NSR:     PHYSICAL EXAM General: Well developed, well nourished, in no acute distress  Lungs:   Clear bilaterally to auscultation and percussion. Heart:   HRRR S1 S2 Pulses are 2+ & equal. Abdomen: Bowel sounds are positive, abdomen soft and non-tender without masses Extremities:   No clubbing, cyanosis or edema.  DP +1 Neuro: Alert and oriented X 3. Psych:  Good affect, responds appropriately   LABS: Basic Metabolic Panel:  Recent Labs  05/03/14 0550 05/04/14 0447  NA 140 138  K 3.8 3.8  CL 109 108  CO2 30 24  GLUCOSE 108* 103*  BUN 20 16  CREATININE 0.76 0.78  CALCIUM 8.6 8.4   Liver Function Tests: No results for input(s): AST, ALT, ALKPHOS, BILITOT, PROT, ALBUMIN in the last 72 hours. No results for input(s): LIPASE, AMYLASE in the last 72 hours. CBC:  Recent Labs  05/03/14 0550 05/04/14 0447  WBC 3.9* 3.3*  HGB 8.8* 8.8*  HCT 28.0* 27.6*  MCV 84.8 83.6  PLT 138* 140*   Cardiac Enzymes: No results for input(s): CKTOTAL, CKMB, CKMBINDEX, TROPONINI in the last 72 hours. BNP: Invalid input(s): POCBNP D-Dimer: No results for input(s): DDIMER in the last 72 hours. Hemoglobin A1C: No results for input(s): HGBA1C in the last 72 hours. Fasting Lipid Panel: No results for input(s): CHOL, HDL, LDLCALC, TRIG, CHOLHDL, LDLDIRECT in the last 72 hours. Thyroid Function  Tests: No results for input(s): TSH, T4TOTAL, T3FREE, THYROIDAB in the last 72 hours.  Invalid input(s): FREET3 Anemia Panel: No results for input(s): VITAMINB12, FOLATE, FERRITIN, TIBC, IRON, RETICCTPCT in the last 72 hours. Coag Panel:   Lab Results  Component Value Date   INR 1.25 05/01/2014   INR 1.38 04/17/2014   INR 1.21 04/10/2014   PROTIME 30.0* 06/29/2013   PROTIME 27.6* 06/08/2013   PROTIME 28.8* 05/29/2013    RADIOLOGY: Dg Chest Port 1 View  04/29/2014   CLINICAL DATA:  Elevated temperature.  Sepsis.  Hypertension  EXAM: PORTABLE CHEST - 1 VIEW  COMPARISON:  04/19/2014  FINDINGS: Elevated left hemidiaphragm. Mild bilateral interstitial prominence. Hazy left lower lobe airspace disease. No pleural effusion or pneumothorax. Stable cardiomediastinal silhouette. Descending thoracic aortic stent graft. No acute osseous abnormality.  IMPRESSION: Cardiomegaly with mild CHF. Hazy left lower lobe airspace disease which may reflect atelectasis versus pneumonia.   Electronically Signed   By: Kathreen Devoid   On: 04/29/2014 11:26   Dg Chest Port 1 View  04/19/2014   CLINICAL DATA:  Pulmonary infiltrates.  EXAM: PORTABLE CHEST - 1 VIEW  COMPARISON:  04/18/2014.  FINDINGS: Persistent severe cardiomegaly with bilateral pulmonary infiltrates and right pleural effusion consistent with congestive heart failure  appear underlying basilar pneumonia cannot be excluded. Aortic stent graft in stable position. No pneumothorax.  IMPRESSION: 1. Persistent severe cardiomegaly bilateral pulmonary alveolar infiltrates and small right pleural effusion consistent with congestive heart failure bibasilar pneumonia cannot be excluded. 2. Aortic stent graft in stable position.   Electronically Signed   By: Marcello Moores  Register   On: 04/19/2014 07:26   Dg Chest Port 1 View  04/18/2014   CLINICAL DATA:  Bilateral airspace disease, history of thoracic aortic aneurysm treated with stent graft  EXAM: PORTABLE CHEST - 1 VIEW   COMPARISON:  Portable chest x-ray of April 17, 2014  FINDINGS: The lungs are better inflated today. The interstitial markings remain increased. The pulmonary vascularity remains engorged. The cardiopericardial silhouette remains enlarged. There are small bilateral pleural effusions layering posteriorly. The bony thorax is unremarkable.  IMPRESSION: Persistent findings of congestive heart failure with pulmonary interstitial edema and small bilateral pleural effusions. Bibasilar subsegmental atelectasis or pneumonia is unchanged.   Electronically Signed   By: David  Martinique   On: 04/18/2014 07:31   Dg Chest Port 1 View  04/17/2014   CLINICAL DATA:  Fever.  EXAM: PORTABLE CHEST - 1 VIEW  COMPARISON:  04/09/2014  FINDINGS: Cardiomediastinal contours with aortic stent graft in place, unchanged from prior exam. Mildly improved right basilar opacity with residual atelectasis. Unchanged atelectasis at the left lung base. No large pleural effusion or pneumothorax. Osseous structures are stable.  IMPRESSION: Improved right basilar aeration. No new consolidation to suggest progressive or new pneumonia.   Electronically Signed   By: Jeb Levering M.D.   On: 04/17/2014 04:15   Dg Chest Port 1 View  04/09/2014   CLINICAL DATA:  Altered mental status  EXAM: PORTABLE CHEST - 1 VIEW  COMPARISON:  Portable exam 1801 hr compared to 03/24/2014  FINDINGS: Atherosclerotic calcification aorta with prior aortic stenting.  Stable heart size and mediastinal contours.  Increased LEFT basilar atelectasis versus infiltrate.  Mild persistent opacity in RIGHT lower lobe little changed.  Upper lungs clear.  No gross pleural effusion or pneumothorax.  IMPRESSION: Persistent RIGHT basilar opacity with increased atelectasis versus infiltrate in LEFT lower lobe.   Electronically Signed   By: Lavonia Dana M.D.   On: 04/09/2014 18:16   Ct Angio Chest Aortic Dissect W &/or W/o  04/29/2014   CLINICAL DATA:  Thoracoabdominal aortic aneurysm post  stenting, recurrent graft infection, fever, hypotension, generalized abdominal pain, sepsis  EXAM: CT ANGIOGRAPHY CHEST, ABDOMEN AND PELVIS  CTA ABDOMEN AND PELVIS WITHOUT AND WITH CONTRAST  TECHNIQUE: Multidetector CT imaging through the chest, abdomen and pelvis was performed using the standard protocol during bolus administration of intravenous contrast. Multiplanar reconstructed images and MIPs were obtained and reviewed to evaluate the vascular anatomy.  CONTRAST:  135mL OMNIPAQUE IOHEXOL 350 MG/ML SOLN IV  COMPARISON:  02/26/2014  FINDINGS: CTA CHEST FINDINGS  Extensive atherosclerotic calcifications aorta, coronary arteries and proximal great vessels.  Endograft identified beginning at proximal descending thoracic aorta extending into abdomen.  No high attenuation mural crescent identified on precontrast imaging.  Descending thoracic aorta stable in size.  Aneurysmal dilatation of the ascending thoracic aorta measuring 4.7 cm transverse immediately proximal to aortic arch, 4.5 cm on previous exam.  Minimal low-attenuation pericardial effusion.  No evidence of dissection or periaortic hemorrhage.  Scattered atelectasis greater in lower lobes and lateral RIGHT upper lobe.  Small BILATERAL pleural effusions.  Minimal patchy infiltrate in lingula.  No thoracic adenopathy or pneumothorax.  Bones demineralized.  Review  of the MIP images confirms the above findings.  CTA ABDOMEN AND PELVIS FINDINGS  Aortic endograft extends from distal aspect of aortic arch through abdominal aorta and aortic bifurcation into the LEFT common iliac artery and RIGHT internal and external iliac arteries.  Aneurysmal dilatation of the proximal abdominal aorta beginning below the diaphragmatic hiatus and extending to the SMA origin, 6.9 x 6.4 cm image 121 previously 6.9 x 6.1 cm.  Second fusiform focus of aortic dilatation at distal abdominal aorta at bifurcation, 4.1 x 4.6 cm image 200, previously 4.4 x 3.9 cm.  Third area of aneurysmal  dilatation located at RIGHT common iliac artery, 4.3 cm diameter image 223, previously 4.4 cm.  Several small foci of calcification are seen within the aneurysm thrombus without definite evidence of endoleak or acute perianeurysmal hemorrhage.  Infiltration of periaortic fat anterior to the proximal abdominal aortic aneurysm at the level of the celiac artery appears unchanged, question related to prior aneurysm rupture/hemorrhage, perianeurysmal fibrosis or infection.  Small pseudoaneurysm identified at the celiac stent on 01/03/2014 exam is not visualized on the current exam nor on the previous study of 02/26/2014 due to interval intervention.  No discrete fluid collection to suggest abscess is identified.  LEFT renal cyst stable.  Liver, spleen, atrophic pancreas, kidneys, and adrenal glands otherwise unremarkable.  Gastrostomy tube present.  Bowel loops suboptimally assessed due to lack of GI contrast, no gross abnormality seen.  No additional mass, adenopathy, free fluid, or free air.  IVC filter noted.  Degenerative disc and facet disease changes thoracolumbar spine.  Review of the MIP images confirms the above findings.  IMPRESSION:  Post stenting of thoracoabdominal aorta through aortic bifurcation into the iliac systems bilaterally farther on RIGHT.  Additional stenting of the celiac, SMA and renal arteries.  Areas of aortic dilatation appear fairly stable in sizes since previous exam.  Perianeurysmal infiltration centered at the level of the celiac artery appears little changed from the previous exam favor related to prior hemorrhage and/or fibrosis, less likely infection.  No discrete abscess collection is identified though graft infection cannot be excluded by CT.  Scattered mild pulmonary atelectasis and small pleural effusions as above.   Electronically Signed   By: Lavonia Dana M.D.   On: 04/29/2014 15:25   Ct Cta Abd/pel W/cm &/or W/o Cm  04/29/2014   CLINICAL DATA:  Thoracoabdominal aortic aneurysm  post stenting, recurrent graft infection, fever, hypotension, generalized abdominal pain, sepsis  EXAM: CT ANGIOGRAPHY CHEST, ABDOMEN AND PELVIS  CTA ABDOMEN AND PELVIS WITHOUT AND WITH CONTRAST  TECHNIQUE: Multidetector CT imaging through the chest, abdomen and pelvis was performed using the standard protocol during bolus administration of intravenous contrast. Multiplanar reconstructed images and MIPs were obtained and reviewed to evaluate the vascular anatomy.  CONTRAST:  168mL OMNIPAQUE IOHEXOL 350 MG/ML SOLN IV  COMPARISON:  02/26/2014  FINDINGS: CTA CHEST FINDINGS  Extensive atherosclerotic calcifications aorta, coronary arteries and proximal great vessels.  Endograft identified beginning at proximal descending thoracic aorta extending into abdomen.  No high attenuation mural crescent identified on precontrast imaging.  Descending thoracic aorta stable in size.  Aneurysmal dilatation of the ascending thoracic aorta measuring 4.7 cm transverse immediately proximal to aortic arch, 4.5 cm on previous exam.  Minimal low-attenuation pericardial effusion.  No evidence of dissection or periaortic hemorrhage.  Scattered atelectasis greater in lower lobes and lateral RIGHT upper lobe.  Small BILATERAL pleural effusions.  Minimal patchy infiltrate in lingula.  No thoracic adenopathy or pneumothorax.  Bones demineralized.  Review of the MIP images confirms the above findings.  CTA ABDOMEN AND PELVIS FINDINGS  Aortic endograft extends from distal aspect of aortic arch through abdominal aorta and aortic bifurcation into the LEFT common iliac artery and RIGHT internal and external iliac arteries.  Aneurysmal dilatation of the proximal abdominal aorta beginning below the diaphragmatic hiatus and extending to the SMA origin, 6.9 x 6.4 cm image 121 previously 6.9 x 6.1 cm.  Second fusiform focus of aortic dilatation at distal abdominal aorta at bifurcation, 4.1 x 4.6 cm image 200, previously 4.4 x 3.9 cm.  Third area of  aneurysmal dilatation located at RIGHT common iliac artery, 4.3 cm diameter image 223, previously 4.4 cm.  Several small foci of calcification are seen within the aneurysm thrombus without definite evidence of endoleak or acute perianeurysmal hemorrhage.  Infiltration of periaortic fat anterior to the proximal abdominal aortic aneurysm at the level of the celiac artery appears unchanged, question related to prior aneurysm rupture/hemorrhage, perianeurysmal fibrosis or infection.  Small pseudoaneurysm identified at the celiac stent on 01/03/2014 exam is not visualized on the current exam nor on the previous study of 02/26/2014 due to interval intervention.  No discrete fluid collection to suggest abscess is identified.  LEFT renal cyst stable.  Liver, spleen, atrophic pancreas, kidneys, and adrenal glands otherwise unremarkable.  Gastrostomy tube present.  Bowel loops suboptimally assessed due to lack of GI contrast, no gross abnormality seen.  No additional mass, adenopathy, free fluid, or free air.  IVC filter noted.  Degenerative disc and facet disease changes thoracolumbar spine.  Review of the MIP images confirms the above findings.  IMPRESSION:  Post stenting of thoracoabdominal aorta through aortic bifurcation into the iliac systems bilaterally farther on RIGHT.  Additional stenting of the celiac, SMA and renal arteries.  Areas of aortic dilatation appear fairly stable in sizes since previous exam.  Perianeurysmal infiltration centered at the level of the celiac artery appears little changed from the previous exam favor related to prior hemorrhage and/or fibrosis, less likely infection.  No discrete abscess collection is identified though graft infection cannot be excluded by CT.  Scattered mild pulmonary atelectasis and small pleural effusions as above.   Electronically Signed   By: Lavonia Dana M.D.   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  EXAM: CTA ABDOMEN AND PELVIS WITHOUT CONTRAST  TECHNIQUE: Multidetector CT imaging of the abdomen and pelvis was performed using the standard protocol during bolus administration of intravenous contrast. Multiplanar reconstructed images and MIPs were obtained and reviewed to evaluate the vascular anatomy.  CONTRAST:  111mL OMNIPAQUE IOHEXOL 350 MG/ML SOLN  COMPARISON:  CTA of the chest, abdomen and pelvis on 02/26/2014 and multiple other imaging studies.  FINDINGS: Complex thoracoabdominal demonstrated endograft placement again noted. Only the lower portion of the descending thoracic component is visualized on the study. The endograft shows stable patency and positioning. Fenestration with stenting of the celiac axis, superior mesenteric artery, bilateral single renal arteries and graft extensions into the right internal and external iliac arteries show normal patency.  Stable soft tissue thickening remains around the celiac axis with stent extension into the common hepatic artery. There no longer is contrast opacification of a separate pseudoaneurysm as depicted by CTA October, 2015. No new pseudoaneurysms identified. No evidence  of abnormal air surrounding the endograft. No abnormal fluid collections.  On the arterial phase of imaging, no obvious endoleak is identified. A delayed sequence was not obtained on the current study. Native right common iliac aneurysm sac remains thrombosed. Distal left common iliac limb and both right internal and external iliac limb landing zones show normal patency and apposition. Native iliac and common femoral arteries showed no significant disease.  The visualized lung bases show small bilateral pleural effusions and bibasilar atelectasis. The liver, gallbladder, spleen and adrenal glands are unremarkable. There  is some motion artifact on the study and the arms were also along the side of the body. There may be some subtle stranding in the fat adjacent to the tail of the pancreas. Correlation suggested with any clinical evidence of acute pancreatitis.  Bowel is unremarkable with no evidence of obstruction or thickening. No free air is identified. There is a gastrostomy tube positioned in the body of the stomach. An IVC filter is in place in the infrarenal IVC. The bladder is unremarkable. No hernias are identified. Bony structures show mild spondylosis of the lower thoracic and lumbar spine.  Review of the MIP images confirms the above findings.  IMPRESSION: 1. No further opacification of celiac axis pseudoaneurysm following extend extension into the common hepatic artery. There remains soft tissue thickening around the celiac axis at the level of prior mycotic pseudoaneurysm. No abnormal fluid collection is identified. 2. Stable patency of complex fenestrated thoracoabdominal endograft extending into native iliac arteries bilaterally. 3. Small bilateral pleural effusions. 4. Motion artifact versus potential subtle inflammatory stranding adjacent to the tail of the pancreas. Recommend correlation with any clinical evidence of pancreatitis.   Electronically Signed   By: Aletta Edouard M.D.   On: 04/19/2014 14:00    Assessment/Plan:  81 year old gentleman is status post repair of abdominal aortic aneurysm in 2014. 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 He has been treated with prolonged antibiotics. He has a history of Pseudomonas bacteremia. He was most recently treated with outpatient high-dose oral ciprofloxacin 750 mg twice a day. This has subsequently been stopped because of prolonged QTc interval. He was admitted with fever 04/29/14. TEE done did not show vegetation.   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 Recurrent sepsis - s/p TEE no vegetation noted. Abx per primary team. Significant improvement.   2. S/p thoracoabdominal aortic aneurysm repair at [facility] with subsequent graft infection causing chronic endovascular leak  3. Moderate aortic stenosis  4. Prolonged QT on EKG: prior Cipro stopped - QT/QTC minimally improved, on 2/16 QT/QTc 554/610 , on 2/17 486/578 ms, 2/19 498/548ms, 2/20 446/517ms, EKG pending today.  no VT on telemetry. Continue to avoid any drugs which would prolong the QT interval.  5. Previous PEG tube but now on a dysphagia 3 diet. Patient denies difficulty  swallowing.  6. Chronic systolic congestive heart failure, clinically improved after Lasix   7. Anemia and thrombocytopenia: followed by oncology, felt related to recurrent infection. Lovenox stopped due to risk of bleeding. Improving  8. Elevated d-dimer: per primary team   Sueanne Margarita, MD  05/05/2014  8:08 AM

## 2014-05-06 MED ORDER — CEPHALEXIN 500 MG PO CAPS
500.0000 mg | ORAL_CAPSULE | Freq: Three times a day (TID) | ORAL | Status: DC
  Administered 2014-05-06 – 2014-05-07 (×4): 500 mg via ORAL
  Filled 2014-05-06 (×6): qty 1

## 2014-05-06 MED ORDER — PRO-STAT SUGAR FREE PO LIQD: 30.0000 mL | Freq: Three times a day (TID) | ORAL | Status: DC

## 2014-05-06 MED ORDER — FREE WATER
200.0000 mL | Freq: Four times a day (QID) | Status: DC
  Administered 2014-05-06 – 2014-05-07 (×5): 200 mL

## 2014-05-06 MED ORDER — PRO-STAT SUGAR FREE PO LIQD
30.0000 mL | Freq: Four times a day (QID) | ORAL | Status: DC
  Administered 2014-05-06 – 2014-05-07 (×4): 30 mL via ORAL
  Filled 2014-05-06 (×7): qty 30

## 2014-05-06 MED ORDER — PRO-STAT SUGAR FREE PO LIQD
200.0000 mL | Freq: Four times a day (QID) | ORAL | Status: DC
  Filled 2014-05-06 (×3): qty 210

## 2014-05-06 MED ORDER — LEVOFLOXACIN 500 MG PO TABS
500.0000 mg | ORAL_TABLET | Freq: Every day | ORAL | Status: DC
  Administered 2014-05-06 – 2014-05-07 (×2): 500 mg via ORAL
  Filled 2014-05-06 (×2): qty 1

## 2014-05-06 MED ORDER — PRO-STAT SUGAR FREE PO LIQD
30.0000 mL | Freq: Three times a day (TID) | ORAL | Status: DC
  Filled 2014-05-06 (×2): qty 30

## 2014-05-06 NOTE — Progress Notes (Signed)
Patient ID: [name], male   DOB: 01/17/1930, 81 y.o.   MRN: [id number]         [facility]    Date of Admission:  04/29/2014   Continuous antibiotic therapy since July 2015                  Principal Problem:   Sepsis Active Problems:   Bacteremia due to Gram-negative bacteria   History of Bacteremia due to Pseudomonas   Proteus infection   History of Clostridium difficile colitis   History of endovascular stent graft for abdominal aortic aneurysm   HTN (hypertension)   Thrombocytopenia   History of DVT of lower extremity (Feb 2015) -post IVC filter   Protein-calorie malnutrition, severe   Status post insertion of percutaneous endoscopic gastrostomy (PEG) tube   AAA (abdominal aortic aneurysm)   FTT (failure to thrive) in adult   Hypotension   Malnutrition of moderate degree   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 antiseptic oral rinse  7 mL Mouth Rinse BID  . cephALEXin  500 mg Oral 3 times per day  . enoxaparin (LOVENOX) injection  40 mg Subcutaneous Q24H  . feeding supplement (JEVITY 1.2 CAL)  1,000 mL Per Tube Q24H  . feeding supplement (PRO-STAT SUGAR FREE 64)  30 mL Oral QID  . free water  200 mL Per Tube QID  . lactulose  10 g Oral Daily  . levofloxacin  500 mg Oral Daily  . magnesium oxide  200 mg Oral Daily  . methylphenidate  5 mg Oral Daily  . simvastatin  20 mg Oral Daily    Subjective: He states that he is feeling better. He has not had any diarrhea recently.  Review of Systems: Pertinent items are noted in HPI.  Past Medical History  Diagnosis Date  . Hypertension   . BPH (benign prostatic hyperplasia)   . Hyperlipidemia   . AAA (abdominal aortic aneurysm) 04/2012    STENTING OF AAA IN [place]  . Junctional cardiac arrhythmia     Occurred postoperatively after urologic surgery  . Severe  sinus bradycardia     Occurred postoperatively after urologic surgery  . Aortic valve disorders   . Other primary cardiomyopathies   . Dysphagia   . Status post insertion of percutaneous endoscopic gastrostomy (PEG) tube 02/11/14  . Bacteremia due to Pseudomonas   . Infected aortic graft   . DVT (deep venous thrombosis) 04/2013    s/p IVC Filter  . Cancer 2008-2009    prostate TREATED WITH RADIATION    History  Substance Use Topics  . Smoking status: Former Smoker -- 2.00 packs/day for 37 years    Types: Cigarettes    Start date: 02/17/1949    Quit date: 03/15/1985  . Smokeless tobacco: Former Systems developer     Comment: quit 25 years ago  . Alcohol Use: No    Family History  Problem Relation Age of Onset  . Lung cancer Father    Allergies  Allergen Reactions  . Atorvastatin Other (See Comments)    Causes constipation  . Penicillins Other (See Comments)    Other reaction(s): RASH HIVES  Tolerates cefepime 2/22    OBJECTIVE: Blood pressure 142/78, pulse 91, temperature 98.3 F (36.8 C), temperature source Oral, resp. rate 17, height 6\' 2"  (1.88 m), weight 206  lb 12.7 oz (93.8 kg), SpO2 91 %. General: He is Visiting with 3 of his daughters Skin: No rash Lungs: Clear Cor: Distant heart sounds Abdomen: Soft and nontender. PEG tube site is normal. Healed midline incision  Lab Results Lab Results  Component Value Date   WBC 3.3* 05/04/2014   HGB 8.8* 05/04/2014   HCT 27.6* 05/04/2014   MCV 83.6 05/04/2014   PLT 140* 05/04/2014    Lab Results  Component Value Date   CREATININE 0.78 05/04/2014   BUN 16 05/04/2014   NA 138 05/04/2014   K 3.8 05/04/2014   CL 108 05/04/2014   CO2 24 05/04/2014    Lab Results  Component Value Date   ALT 11 04/30/2014   AST 19 04/30/2014   ALKPHOS 39 04/30/2014   BILITOT 0.4 04/30/2014     Microbiology: Recent Results (from the past 240 hour(s))  Blood Culture (routine x 2)     Status: None   Collection Time: 04/29/14 11:15 AM    Result Value Ref Range Status   Specimen Description BLOOD LEFT ANTECUBITAL  Final   Special Requests BOTTLES DRAWN AEROBIC AND ANAEROBIC 5CC  Final   Culture   Final    PROTEUS MIRABILIS Note: SUSCEPTIBILITIES PERFORMED ON PREVIOUS CULTURE WITHIN THE LAST 5 DAYS. Note: Gram Stain Report Called to,Read Back By and Verified With: CAROL SCHEILLER RN 04/30/14 AT 14:00 BY [facility] Performed at [facility]    Report Status 05/03/2014 FINAL  Final  Blood Culture (routine x 2)     Status: None   Collection Time: 04/29/14 11:18 AM  Result Value Ref Range Status   Specimen Description BLOOD HAND LEFT  Final   Special Requests BOTTLES DRAWN AEROBIC AND ANAEROBIC 5CC  Final   Culture   Final    PROTEUS MIRABILIS Note: Gram Stain Report Called to,Read Back By and Verified With: [place] USSERY ON 2.16.2016 AT 7:55P BY [facility] Performed at [facility]    Report Status 05/03/2014 FINAL  Final   Organism ID, Bacteria PROTEUS MIRABILIS  Final      Susceptibility   Proteus mirabilis - MIC*    AMPICILLIN <=2 SENSITIVE Sensitive     AMPICILLIN/SULBACTAM <=2 SENSITIVE Sensitive     CEFAZOLIN 8 SENSITIVE Sensitive     CEFEPIME <=1 SENSITIVE Sensitive     CEFTAZIDIME <=1 SENSITIVE Sensitive     CEFTRIAXONE <=1 SENSITIVE Sensitive     CIPROFLOXACIN >=4 RESISTANT Resistant     GENTAMICIN >=16 RESISTANT Resistant     IMIPENEM 1 SENSITIVE Sensitive     PIP/TAZO <=4 SENSITIVE Sensitive     TOBRAMYCIN 8 INTERMEDIATE Intermediate     TRIMETH/SULFA >=320 RESISTANT Resistant     * PROTEUS MIRABILIS  Urine culture     Status: None   Collection Time: 04/29/14 11:51 AM  Result Value Ref Range Status   Specimen Description URINE, CATHETERIZED  Final   Special Requests NONE  Final   Colony Count NO GROWTH Performed at [facility]   Final   Culture NO GROWTH Performed at [facility]   Final   Report Status 04/30/2014 FINAL  Final  Culture, blood (routine x 2)     Status:  None (Preliminary result)   Collection Time: 05/02/14  3:40 PM  Result Value Ref Range Status   Specimen Description BLOOD RIGHT ARM  Final   Special Requests BOTTLES DRAWN AEROBIC AND ANAEROBIC 10CC  Final   Culture   Final  BLOOD CULTURE RECEIVED NO GROWTH TO DATE CULTURE WILL BE HELD FOR 5 DAYS BEFORE ISSUING A FINAL NEGATIVE REPORT Performed at [facility]    Report Status PENDING  Incomplete  Culture, blood (routine x 2)     Status: None (Preliminary result)   Collection Time: 05/02/14  3:50 PM  Result Value Ref Range Status   Specimen Description BLOOD RIGHT HAND  Final   Special Requests BOTTLES DRAWN AEROBIC ONLY 10CC  Final   Culture   Final           BLOOD CULTURE RECEIVED NO GROWTH TO DATE CULTURE WILL BE HELD FOR 5 DAYS BEFORE ISSUING A FINAL NEGATIVE REPORT Performed at [facility]    Report Status PENDING  Incomplete    Assessment: Mr. Duerson has had several episodes of Pseudomonas bacteremia and Proteus bacteremia since his aortic aneurysm repair last July. He needs lifelong suppressive antibiotic therapy. Unfortunately quinolone antibiotics are the only oral agents that could be used to suppress Pseudomonas and they are associated with prolongation of the QT interval and increase risk for arrhythmia. He has had prolonged QT intervals measured both on and off quinolone antibiotics in the past several months. Unfortunately any management strategy carries risk in this situation. If he does not use oral quinolone antibiotics he will need long-term IV antibiotic therapy with cefepime and this carries with it the risk of PICC-related infection and DVT. Using an antibiotic regimen that does not cover pseudomonas carries a very high with of relapsing infection. Mr. Avino and his daughters are also aware that any antibiotic therapy carries a high risk of relapsing C. difficile colitis. His daughters have indicated that he has told them that he wants to  continue antibiotic therapy. I asked them to think about these competing risks and benefits.I will followup tomorrow afternoon assuming he is still here at that time.  Plan: 1. His family is to consider various antibiotic options  Michel Bickers, MD [facility] [place] [phone] pager   [phone] cell 05/06/2014, 4:39 PM

## 2014-05-06 NOTE — Progress Notes (Signed)
Physical Therapy Treatment Patient Details Name: [name] MRN: [id number] DOB: 1929-05-08 Today's Date: 05/06/2014    History of Present Illness 81 year old gentleman is status post repair of abdominal aortic aneurysm in 2014. 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 Admitted with sepsis 04/29/14.    PT Comments    Pt with increased weakness today, requiring mod assist to stand from bed. Limited by dizziness and needed to sit, with symptoms quickly resolving however pt refused to attempt to ambulate. Of note, pt has been increasingly non-compliant with nursing and therapy's attempt to increase patient's mobility. SpO2 94% on room air, HR in 80s. Patient will continue to benefit from skilled physical therapy services to further improve independence with functional mobility.   Follow Up Recommendations  SNF;Supervision/Assistance - 24 hour (If family refuses SNF, would benefit from [facility])     Equipment Recommendations  None recommended by PT    Recommendations for Other Services OT consult     Precautions / Restrictions Precautions Precautions: Fall Precaution Comments: Has PEG tube Restrictions Weight Bearing Restrictions: No    Mobility  Bed Mobility Overal bed mobility: Modified Independent             General bed mobility comments: Uses rail, slow to rise today, appears to be more taxing effort from previous session  Transfers Overall transfer level: Needs assistance Equipment used: Rolling walker (2 wheeled) Transfers: Sit to/from Stand Sit to Stand: Mod assist         General transfer comment: Mod assist for boost and balance. Required two attempts by patient. Became dizzy upon standing with increased sway and required to sit with assist for control back onto bed. Felt better after sitting for approx 30 seconds but refused to stand again.  Ambulation/Gait                 Stairs            Wheelchair  Mobility    Modified Rankin (Stroke Patients Only)       Balance                                    Cognition Arousal/Alertness: Awake/alert Behavior During Therapy: WFL for tasks assessed/performed Overall Cognitive Status: Within Functional Limits for tasks assessed                      Exercises General Exercises - Lower Extremity Ankle Circles/Pumps: AROM;Both;Seated;20 reps Long Arc Quad: Strengthening;Both;Seated;20 reps Heel Slides: Strengthening;Both;Supine;20 reps Straight Leg Raises: AAROM;Strengthening;Both;10 reps;Supine Hip Flexion/Marching: Strengthening;Both;Seated;20 reps    General Comments General comments (skin integrity, edema, etc.): Spoke with patient the importance of being out of bed. Refuses to sit in chair. Discussed with family to continue to encourage pt to be more mobile      Pertinent Vitals/Pain Pain Assessment: No/denies pain    Home Living                      Prior Function            PT Goals (current goals can now be found in the care plan section) Acute Rehab PT Goals Patient Stated Goal: None stated PT Goal Formulation: With patient Time For Goal Achievement: 05/16/14 Potential to Achieve Goals: Fair Progress towards PT goals: Progressing toward goals    Frequency  Min 3X/week    PT  Plan Current plan remains appropriate    Co-evaluation             End of Session Equipment Utilized During Treatment: Gait belt Activity Tolerance: Treatment limited secondary to medical complications (Comment) (dizziness) Patient left: with call bell/phone within reach;in bed     Time: 4287-6811 PT Time Calculation (min) (ACUTE ONLY): 17 min  Charges:  $Therapeutic Exercise: 8-22 mins                    G Codes:      Ellouise Newer 06/05/14, 12:59 PM Camille Bal [place], [place]

## 2014-05-06 NOTE — Progress Notes (Signed)
It has been documented the patient has significant QT prolongation. Unfortunately, he needs chronic suppression with a medication that can prolong the QT interval. He has been stable on this medicine as an outpatient. I cannot give any expert opinion concerning any other approaches that can be taken. I will ask the electrophysiology team to consult and give an opinion.  Daryel November, MD

## 2014-05-06 NOTE — Progress Notes (Signed)
ANTIBIOTIC CONSULT NOTE - FOLLOW UP  Pharmacy Consult for Cefepime Indication: bacteremia  Allergies  Allergen Reactions  . Atorvastatin Other (See Comments)    Causes constipation  . Penicillins Other (See Comments)    Other reaction(s): RASH HIVES  Tolerates cefepime 2/22    Patient Measurements: Height: 6\' 2"  (188 cm) Weight: 206 lb 12.7 oz (93.8 kg) IBW/kg (Calculated) : 82.2  Vital Signs: Temp: 98.8 F (37.1 C) (02/22 0545) Temp Source: Oral (02/22 0545) BP: 116/69 mmHg (02/22 0545) Pulse Rate: 77 (02/22 0545)  Labs:  Recent Labs  05/04/14 0447  WBC 3.3*  HGB 8.8*  PLT 140*  CREATININE 0.78   Estimated Creatinine Clearance: 82.8 mL/min (by C-G formula based on Cr of 0.78).  Assessment: 84yom with hx pseudomonas endovascular aortic graft infection, on long term suppressive levofloxacin, but then developed proteus bacteremia 12/15. He was treated with cipro but 2/15 blood cultures are now growing proteus that is resistant to cipro so he was switched to cefepime. He continues on day #4 cefepime. Renal function stable.  Vanc2/15 > 2/18 Cefepime 2/15 x 1 dose Azactam 2/15 > 2/19 Cefepime 2/19 >  2/15 BCx > Proteus mirabilis - now resistant to cipro/gent/septra 2/15 urine - neg FINAL 2/15 sputum - no sample yet 2/18 BCx > ngtd   Plan:  1) Continue cefepime 1g IV q12 2) Follow up LOT  Nena Jordan, PharmD, BCPS  05/06/2014 10:34 AM

## 2014-05-06 NOTE — Progress Notes (Signed)
Patient ID: [name], male   DOB: January 19, 1930, 81 y.o.   MRN: [id number] EP Attending  Asked to give an opinion by Dr. Ron Parker regarding QT prolongation in the setting of pseudomonas infection. There is no medication that can reverse the QT prolongation. Stopping Levaquin is indicated. I'll defer additional anti-biotic choice to the ID team. I would suggest stopping Levaquin as risk for proarrhythmia is high continuing Levaquin.   Cristopher Peru, M.D.

## 2014-05-06 NOTE — Progress Notes (Signed)
[place] TEAM 1 - Stepdown/ICU TEAM Progress Note  [name] [name]:[id number] DOB: September 28, 1929 DOA: 04/29/2014 PCP: [name], MD  Admit HPI / Brief Narrative: 81 year old male with PMH of AAA s/p repair and graft infection causing chronic endovascular leak and infection with prolonged antibiotic course. (Last course of treatment was on 03/07/2014), Pseudomonas bacteremia, dysphagia with recurrent aspiration requiring PEG tube placement, DVT s/p IVC filter placement, hypertension, atrial flutter, dyslipidemia, systolic CHF (EF of 27%), with recurrent hospitalizations related to PNA and/or sepsis (this is 6th hospitalization in last 3 months).  Patient was recently hospitalized for sepsis and discharged  on high-dose oral ciprofloxacin (750 mg twice daily) with plan on palliative care follow-up at SNF. As per the son,  When visiting his father morning of admission, he found him with fever and chills. Patient reports  an episode of nonbloody vomiting yesterday morning, and having productive cough for the last 2-3 days. Patient has been having small amount of food at the facility besides being on PEG feeds during the night. He denies headache, dizziness, chest pain, palpitations, SOB, abdominal pain, bowel or urinary symptoms.He is nonambulatory at baseline. In ED, workup revealed T 104.6, pulse 125, RR 32, BP 81/51, and lactic acid was elevated at 2.5. UA was cloudy with mod Hgb and small leukocyte. CXR with possible left LL infilterate. EKG with sinus tachycardia  At 125, and anterior T wave inversion, prolonged QTc 591.  He was placed on IV Vancomycin and Cefepime, given 3L NS bolus, with improvement in BP. Pt was then admitted with sepsis for further workup.  He was later transitioned to IV vancomycin and Azactam, blood cultures growing gram-negative rods 2(Proteus)  . Patient hadTEE on 2/18, with no evidence of vegetation.  HPI/Subjective: Patient denies any chest pain, cough, shortness of  breath, fever or chills,  Assessment/Plan:  Sepsis/ bacteremia (PROTEUS MIRABILIS ) -Initially with empiric vancomycin and aztreonam, stopped IV vancomycin 2/18, given blood culture growing only gram-negative rods, aztreonam switched  to cefepime on 2/19 >> 2/22 by infectious disease. -High dose Cipro on recent discharge DC due to QT prolongation  - TEE 2/18 showing no vegetation -blood cultures growing PROTEUS MIRABILIS  ,this is most likely related to his recurrent AAA graft infection. -urine strep/ legionella antigen negative - Repeat blood cultures 2/18 no growth to date. - Patient blood cultures did not grow Pseudomonas, but this is secondary patient taking ciprofloxacin at home, so at this point patient appears to be having Proteus and Pseudomonas bacteremia, discussed with infectious disease, very complicated infection to treat, with a very limited options ,discussed with the family regarding risks of quinolones and QTC prolongation, they understand the risks and they accepted, as they father to stay at home on oral antibiotics without a PICC line( IV antibiotic was PICC line want all for long-term solution), so discussed with infectious disease Dr. Megan Salon, will discharge on Keflex 500 every 8 hours to treat her for Proteus, and levofloxacin 500 mg oral daily to cover for Pseudomonas, and this is lifelong, discussed with Dr. Derrill Kay from cardiology, will recheck EKG in a.m.Marland Kitchen  Systolic Congestive heart failure -Strict in and out -Daily weight -Patient's PCXR from 2/15 shows pulmonary edema; Lasix 40 mg 1   Prolonged QTC -Increasing QTC on repeat EKG 2/16 -hold ciprofloxacin, Pepcid, Toviaz,  Remeron. -Cards following -Trop x3  negative -Mag normal - Continue with telemetry monitor - As per above discussion, be started on levofloxacin  DVT of lower extremity (Feb 2015) -s/p IVC filter-not  on anticoagulation . - Not clear when Xarelto was stopped. - Oncology consult appreciated, no  need for further anticoagulation as patient has IVC filter as per Dr. Marin Olp, continue with prophylaxis dose.   Leukopenia/Anemia/Thrombocytopenia -Oncology following   AAA (abdominal aortic aneurysm) -Stable per CT scan 2/15  Chronic dysphagia -Patient with history of dysphagia, and dysphagia 3 with thin liquid -Nutrition consult for PEG tube feedings  - Continue with nocturnal tube feeds  ?UTI -UA with mod Hgb, small leukocytes, few bacteria -urine culture- no growth  History of C. Difficile -Recent stool for c diff negative. Monitor for now while on antibiotics.    Protein-calorie malnutrition, severe -Restart tube feeds per nutrition, continue with supplement    Code Status: Partial Family Communication: Spoke with sisters at bedside Disposition: Family wants to take patient home once stable.   Consultants: Cardiology  Oncology  Infectious disease    Procedure/Significant Events:  TEE 2/18 1/27 echocardiogram;- LVEF= 35% to 40%. Diffuse hypokinesis. - Aortic valve: moderately stenotic.  - Left atrium: mildly to moderately dilated. 2/15 CT angiogram chest PE protocol;-Post stenting of thoracoabdominal aorta through aortic bifurcation into the iliac systems bilaterally farther on RIGHT. -Additional stenting of the celiac, SMA and renal arteries. 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 -Scattered mild pulmonary atelectasis and small pleural effusions  2/15 PCXR;Cardiomegaly with mild CHF. Hazy left lower lobe airspace disease ;atelectasis vs pneumonia.    Antibiotics: Aztreonam 2/15>> 2/19 Vancomycin 2/15>> 2/18 Cefepime 2/19>> 2/22 Levofloxacin 2/22 Keflex 2/22  DVT prophylaxis: Lovenox t   Devices IVC filter   LINES / TUBES:          Objective: VITAL SIGNS: Temp: 98.8 F (37.1 C) (02/22 0545) Temp  Source: Oral (02/22 0545) BP: 116/69 mmHg (02/22 0545) Pulse Rate: 77 (02/22 0545) SPO2; FIO2:   Intake/Output Summary (Last 24 hours) at 05/06/14 1241 Last data filed at 05/06/14 0827  Gross per 24 hour  Intake   1735 ml  Output    475 ml  Net   1260 ml     Exam: General: A/O 4, NAD, No acute respiratory distress Lungs: diffuse rhonchi, decreased breath sounds LLL, with wheezes RUL  Cardiovascular: Regular rate and rhythm . Murmur heard Abdomen: Nontender, nondistended, soft, bowel sounds positive, no rebound, no ascites, no appreciable mass, PEG tube in place negative sign of infection Extremities: No significant cyanosis, clubbing; bilateral mild pedal edema   Data Reviewed: Basic Metabolic Panel:  Recent Labs Lab 04/30/14 0900 05/02/14 0540 05/03/14 0550 05/04/14 0447  NA 136 140 140 138  K 3.6 3.6 3.8 3.8  CL 112 109 109 108  CO2 22 25 30 24   GLUCOSE 89 86 108* 103*  BUN 22 21 20 16   CREATININE 0.93 0.77 0.76 0.78  CALCIUM 8.1* 8.8 8.6 8.4  MG 1.8  --   --   --    Liver Function Tests:  Recent Labs Lab 04/30/14 0900  AST 19  ALT 11  ALKPHOS 39  BILITOT 0.4  PROT 5.0*  ALBUMIN 2.0*   No results for input(s): LIPASE, AMYLASE in the last 168 hours. No results for input(s): AMMONIA in the last 168 hours. CBC:  Recent Labs Lab 04/30/14 0900 05/01/14 0340 05/02/14 0540 05/03/14 0550 05/04/14 0447  WBC 4.1 3.6* 4.0 3.9* 3.3*  NEUTROABS 3.1  --   --   --   --  HGB 8.1* 8.8* 9.3* 8.8* 8.8*  HCT 25.1* 27.7* 29.6* 28.0* 27.6*  MCV 84.8 84.7 87.1 84.8 83.6  PLT 88* 97* 128* 138* 140*   Cardiac Enzymes:  Recent Labs Lab 04/30/14 0900 04/30/14 1401 04/30/14 2005  TROPONINI <0.03 <0.03 <0.03   BNP (last 3 results)  Recent Labs  04/09/14 1918  BNP 1434.7*    ProBNP (last 3 results) No results for input(s): PROBNP in the last 8760 hours.  CBG: No results for input(s): GLUCAP in the last 168 hours.  Recent Results (from the past 240  hour(s))  Blood Culture (routine x 2)     Status: None   Collection Time: 04/29/14 11:15 AM  Result Value Ref Range Status   Specimen Description BLOOD LEFT ANTECUBITAL  Final   Special Requests BOTTLES DRAWN AEROBIC AND ANAEROBIC 5CC  Final   Culture   Final    PROTEUS MIRABILIS Note: SUSCEPTIBILITIES PERFORMED ON PREVIOUS CULTURE WITHIN THE LAST 5 DAYS. Note: Gram Stain Report Called to,Read Back By and Verified With: CAROL SCHEILLER RN 04/30/14 AT 14:00 BY [facility] Performed at [facility]    Report Status 05/03/2014 FINAL  Final  Blood Culture (routine x 2)     Status: None   Collection Time: 04/29/14 11:18 AM  Result Value Ref Range Status   Specimen Description BLOOD HAND LEFT  Final   Special Requests BOTTLES DRAWN AEROBIC AND ANAEROBIC 5CC  Final   Culture   Final    PROTEUS MIRABILIS Note: Gram Stain Report Called to,Read Back By and Verified With: [place] USSERY ON 2.16.2016 AT 7:55P BY [facility] Performed at [facility]    Report Status 05/03/2014 FINAL  Final   Organism ID, Bacteria PROTEUS MIRABILIS  Final      Susceptibility   Proteus mirabilis - MIC*    AMPICILLIN <=2 SENSITIVE Sensitive     AMPICILLIN/SULBACTAM <=2 SENSITIVE Sensitive     CEFAZOLIN 8 SENSITIVE Sensitive     CEFEPIME <=1 SENSITIVE Sensitive     CEFTAZIDIME <=1 SENSITIVE Sensitive     CEFTRIAXONE <=1 SENSITIVE Sensitive     CIPROFLOXACIN >=4 RESISTANT Resistant     GENTAMICIN >=16 RESISTANT Resistant     IMIPENEM 1 SENSITIVE Sensitive     PIP/TAZO <=4 SENSITIVE Sensitive     TOBRAMYCIN 8 INTERMEDIATE Intermediate     TRIMETH/SULFA >=320 RESISTANT Resistant     * PROTEUS MIRABILIS  Urine culture     Status: None   Collection Time: 04/29/14 11:51 AM  Result Value Ref Range Status   Specimen Description URINE, CATHETERIZED  Final   Special Requests NONE  Final   Colony Count NO GROWTH Performed at [facility]   Final   Culture NO GROWTH Performed at [facility]    Final   Report Status 04/30/2014 FINAL  Final  Culture, blood (routine x 2)     Status: None (Preliminary result)   Collection Time: 05/02/14  3:40 PM  Result Value Ref Range Status   Specimen Description BLOOD RIGHT ARM  Final   Special Requests BOTTLES DRAWN AEROBIC AND ANAEROBIC 10CC  Final   Culture   Final           BLOOD CULTURE RECEIVED NO GROWTH TO DATE CULTURE WILL BE HELD FOR 5 DAYS BEFORE ISSUING A FINAL NEGATIVE REPORT Performed at [facility]    Report Status PENDING  Incomplete  Culture, blood (routine x 2)     Status: None (Preliminary result)  Collection Time: 05/02/14  3:50 PM  Result Value Ref Range Status   Specimen Description BLOOD RIGHT HAND  Final   Special Requests BOTTLES DRAWN AEROBIC ONLY 10CC  Final   Culture   Final           BLOOD CULTURE RECEIVED NO GROWTH TO DATE CULTURE WILL BE HELD FOR 5 DAYS BEFORE ISSUING A FINAL NEGATIVE REPORT Performed at [facility]    Report Status PENDING  Incomplete     Studies:  Recent x-ray studies have been reviewed in detail by the Attending Physician  Scheduled Meds:  Scheduled Meds: . antiseptic oral rinse  7 mL Mouth Rinse BID  . cephALEXin  500 mg Oral 3 times per day  . enoxaparin (LOVENOX) injection  40 mg Subcutaneous Q24H  . feeding supplement (JEVITY 1.2 CAL)  1,000 mL Per Tube Q24H  . feeding supplement (PRO-STAT SUGAR FREE 64)  30 mL Oral TID WC  . lactulose  10 g Oral Daily  . levofloxacin  500 mg Oral Daily  . magnesium oxide  200 mg Oral Daily  . methylphenidate  5 mg Oral Daily  . simvastatin  20 mg Oral Daily    Time spent on care of this patient: 30 mins   Kemani Demarais , MD  [facility] Office  [phone] Pager [phone]  On-Call/Text Page:      [email or web address]      password [email or web address]  If 7PM-7AM, please contact night-coverage [email or web address] Password [email or web address] 05/06/2014, 12:41 PM   LOS: 7 days

## 2014-05-06 NOTE — Progress Notes (Addendum)
Family called with concerns of dehydration; notified MD; MD ordered 200 mls free water 4X's daily; called daughter-Brenda and notified her of this change; MD spoke with daughter as well.  Rowe Pavy, RN

## 2014-05-06 NOTE — Progress Notes (Signed)
NUTRITION FOLLOW UP  Intervention:    Continue nocturnal TF with Jevity 1.2 at 100 ml/h x 12 hours per night (6 PM - 6 AM).   Add Prostat 30 ml per tube TID to increase intake to 1740 kcals (87% of calorie needs) and 112 gm protein (100% of protein needs), 972 ml free water daily.  Nutrition Dx:   Inadequate oral intake related to dysphagia as evidenced by poor intake of meals. Ongoing.  Goal:   Intake to meet >90% of estimated nutrition needs.  Monitor:   PO intake, TF tolerance/adequacy, weight trend, labs.  Assessment:   Patient admitted from SNF for fever. He has a history of dysphagia and has a PEG.   Patient is refusing to eat meals because he is getting TF at night and says he is not hungry. Last admission, his excuse for not eating was because he didn't have dentures, but he got new ones recently. He agreed to try tomato soup for supper tonight. He also stated he would start eating well when he returned home tomorrow morning.  Patient is currently receiving Jevity 1.2 via PEG at 100 ml/h x 12 hours nocturnally to provide 1440 kcals (70% of estimated needs), 67 gm protein (65% of estimated needs), and 972 ml free water daily.    Height: Ht Readings from Last 1 Encounters:  04/29/14 6\' 2"  (1.88 m)    Weight Status:   Wt Readings from Last 1 Encounters:  05/06/14 206 lb 12.7 oz (93.8 kg)   04/30/14 217 lb 13 oz (98.8 kg)       Re-estimated needs:  Kcal: 2000-2200 Protein: 100-120 gm Fluid: 2-2.2 L  Skin: WDL  Diet Order: DIET DYS 3 with thin liquids   Intake/Output Summary (Last 24 hours) at 05/06/14 1439 Last data filed at 05/06/14 0827  Gross per 24 hour  Intake   1735 ml  Output    475 ml  Net   1260 ml    Last BM: 2/20   Labs:   Recent Labs Lab 04/30/14 0900 05/02/14 0540 05/03/14 0550 05/04/14 0447  NA 136 140 140 138  K 3.6 3.6 3.8 3.8  CL 112 109 109 108  CO2 22 25 30 24   BUN 22 21 20 16   CREATININE 0.93 0.77 0.76 0.78  CALCIUM  8.1* 8.8 8.6 8.4  MG 1.8  --   --   --   GLUCOSE 89 86 108* 103*    CBG (last 3)  No results for input(s): GLUCAP in the last 72 hours.  Scheduled Meds: . antiseptic oral rinse  7 mL Mouth Rinse BID  . cephALEXin  500 mg Oral 3 times per day  . enoxaparin (LOVENOX) injection  40 mg Subcutaneous Q24H  . feeding supplement (JEVITY 1.2 CAL)  1,000 mL Per Tube Q24H  . feeding supplement (PRO-STAT SUGAR FREE 64)  30 mL Oral TID WC  . lactulose  10 g Oral Daily  . levofloxacin  500 mg Oral Daily  . magnesium oxide  200 mg Oral Daily  . methylphenidate  5 mg Oral Daily  . simvastatin  20 mg Oral Daily    Continuous Infusions:      Molli Barrows, RD, LDN, [place] Pager [phone] After Hours Pager [phone]

## 2014-05-06 NOTE — Progress Notes (Signed)
Medicare Important Message given? YES  (If response is "NO", the following Medicare IM given date fields will be blank)  Date Medicare IM given: 05/06/14 Medicare IM given by:  Miakoda Mcmillion  

## 2014-05-07 DIAGNOSIS — B964 Proteus (mirabilis) (morganii) as the cause of diseases classified elsewhere: Secondary | ICD-10-CM

## 2014-05-07 DIAGNOSIS — T827XXD Infection and inflammatory reaction due to other cardiac and vascular devices, implants and grafts, subsequent encounter: Secondary | ICD-10-CM

## 2014-05-07 MED ORDER — SIMVASTATIN 20 MG PO TABS: 20.0000 mg | ORAL_TABLET | Freq: Every day | ORAL | Status: AC

## 2014-05-07 MED ORDER — ALBUTEROL SULFATE (2.5 MG/3ML) 0.083% IN NEBU: 2.5000 mg | INHALATION_SOLUTION | RESPIRATORY_TRACT | Status: DC | PRN

## 2014-05-07 MED ORDER — JEVITY 1.2 CAL PO LIQD: 1000.0000 mL | ORAL | Status: DC

## 2014-05-07 MED ORDER — PRO-STAT SUGAR FREE PO LIQD: 30.0000 mL | Freq: Three times a day (TID) | ORAL | Status: DC

## 2014-05-07 MED ORDER — CEPHALEXIN 500 MG PO CAPS
500.0000 mg | ORAL_CAPSULE | Freq: Three times a day (TID) | ORAL | Status: DC
Start: 2014-05-07 — End: 2014-05-07

## 2014-05-07 MED ORDER — METHYLPHENIDATE HCL 5 MG PO TABS: 5.0000 mg | ORAL_TABLET | Freq: Every day | ORAL | Status: DC

## 2014-05-07 MED ORDER — BISACODYL 10 MG RE SUPP: 10.0000 mg | RECTAL | Status: AC | PRN

## 2014-05-07 MED ORDER — LEVOFLOXACIN 500 MG PO TABS: 500.0000 mg | ORAL_TABLET | Freq: Every day | ORAL | Status: DC

## 2014-05-07 MED ORDER — LACTULOSE 10 GM/15ML PO SOLN: 10.0000 g | Freq: Every day | ORAL | Status: DC

## 2014-05-07 MED ORDER — DEXTROSE 5 % IV SOLN: 2.0000 g | Freq: Two times a day (BID) | INTRAVENOUS | Status: DC

## 2014-05-07 MED ORDER — FREE WATER: 200.0000 mL | Freq: Four times a day (QID) | Status: AC

## 2014-05-07 MED ORDER — SODIUM CHLORIDE 0.9 % IJ SOLN: 10.0000 mL | INTRAMUSCULAR | Status: DC | PRN

## 2014-05-07 MED ORDER — MAGNESIUM HYDROXIDE 400 MG/5ML PO SUSP: 30.0000 mL | Freq: Every day | ORAL | Status: DC | PRN

## 2014-05-07 NOTE — Progress Notes (Signed)
Patient discharged to home via ambulance. Discharge instructions and prescription given to the patient's daughter [name] at the bedside. [name] verbalized understanding of instructions given. [place], RN

## 2014-05-07 NOTE — Progress Notes (Signed)
PT Cancellation Note  Patient Details Name: [name] MRN: [id number] DOB: 09/24/1929   Cancelled Treatment:    Reason Eval/Treat Not Completed: Patient at procedure or test/unavailable Pt currently undergoing sterile procedure in room. Will follow up as time allows.  Ellouise Newer 05/07/2014, 2:23 PM Camille Bal [place], [place]

## 2014-05-07 NOTE — Progress Notes (Signed)
Peripherally Inserted Central Catheter/Midline Placement  The IV Nurse has discussed with the patient and/or persons authorized to consent for the patient, the purpose of this procedure and the potential benefits and risks involved with this procedure.  The benefits include less needle sticks, lab draws from the catheter and patient may be discharged home with the catheter.  Risks include, but not limited to, infection, bleeding, blood clot (thrombus formation), and puncture of an artery; nerve damage and irregular heat beat.  Alternatives to this procedure were also discussed.  PICC/Midline Placement Documentation        Mickeal Skinner 05/07/2014, 2:48 PM

## 2014-05-07 NOTE — Discharge Instructions (Signed)
Follow with Primary MD [name], MD in 7 days   Get CBC, CMP, 2 view Chest X ray checked  by Primary MD next visit.    Activity: As tolerated with Full fall precautions use walker/cane & assistance as needed   Disposition Home with home care   Diet: Dysphagia 3 with thin liquid daytime, on tube feeds at nighttime , with feeding assistance and aspiration precautions as needed.  For Heart failure patients - Check your Weight same time everyday, if you gain over 2 pounds, or you develop in leg swelling, experience more shortness of breath or chest pain, call your Primary MD immediately. Follow Cardiac Low Salt Diet and 1.5 lit/day fluid restriction.   On your next visit with your primary care physician please Get Medicines reviewed and adjusted.   Please request your Prim.MD to go over all Hospital Tests and Procedure/Radiological results at the follow up, please get all Hospital records sent to your Prim MD by signing hospital release before you go home.   If you experience worsening of your admission symptoms, develop shortness of breath, life threatening emergency, suicidal or homicidal thoughts you must seek medical attention immediately by calling 911 or calling your MD immediately  if symptoms less severe.  You Must read complete instructions/literature along with all the possible adverse reactions/side effects for all the Medicines you take and that have been prescribed to you. Take any new Medicines after you have completely understood and accpet all the possible adverse reactions/side effects.   Do not drive, operating heavy machinery, perform activities at heights, swimming or participation in water activities or provide baby sitting services if your were admitted for syncope or siezures until you have seen by Primary MD or a Neurologist and advised to do so again.  Do not drive when taking Pain medications.    Do not take more than prescribed Pain, Sleep and Anxiety  Medications  Special Instructions: If you have smoked or chewed Tobacco  in the last 2 yrs please stop smoking, stop any regular Alcohol  and or any Recreational drug use.  Wear Seat belts while driving.   Please note  You were cared for by a hospitalist during your hospital stay. If you have any questions about your discharge medications or the care you received while you were in the hospital after you are discharged, you can call the unit and asked to speak with the hospitalist on call if the hospitalist that took care of you is not available. Once you are discharged, your primary care physician will handle any further medical issues. Please note that NO REFILLS for any discharge medications will be authorized once you are discharged, as it is imperative that you return to your primary care physician (or establish a relationship with a primary care physician if you do not have one) for your aftercare needs so that they can reassess your need for medications and monitor your lab values.

## 2014-05-07 NOTE — Progress Notes (Signed)
Patient ID: [name], male   DOB: [birth year], 81 y.o.   MRN: [id number]         [facility]    Date of Admission:  04/29/2014   Continuous antibiotic therapy since July 2015                  Principal Problem:   Sepsis Active Problems:   Bacteremia due to Gram-negative bacteria   History of Bacteremia due to Pseudomonas   Proteus infection   History of Clostridium difficile colitis   History of endovascular stent graft for abdominal aortic aneurysm   HTN (hypertension)   Thrombocytopenia   History of DVT of lower extremity (Feb 2015) -post IVC filter   Protein-calorie malnutrition, severe   Status post insertion of percutaneous endoscopic gastrostomy (PEG) tube   AAA (abdominal aortic aneurysm)   FTT (failure to thrive) in adult   Hypotension   Malnutrition of moderate degree   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 antiseptic oral rinse  7 mL Mouth Rinse BID  . cephALEXin  500 mg Oral 3 times per day  . enoxaparin (LOVENOX) injection  40 mg Subcutaneous Q24H  . feeding supplement (JEVITY 1.2 CAL)  1,000 mL Per Tube Q24H  . feeding supplement (PRO-STAT SUGAR FREE 64)  30 mL Oral QID  . free water  200 mL Per Tube QID  . lactulose  10 g Oral Daily  . levofloxacin  500 mg Oral Daily  . magnesium oxide  200 mg Oral Daily  . methylphenidate  5 mg Oral Daily  . simvastatin  20 mg Oral Daily    Subjective: He states that he is feeling better. He has not had any diarrhea recently. He had a new left arm PICC placed today. He is very eager to go home this evening.  Review of Systems: Pertinent items are noted in HPI.  Past Medical History  Diagnosis Date  . Hypertension   . BPH (benign prostatic hyperplasia)   . Hyperlipidemia   . AAA (abdominal aortic aneurysm) 04/2012    STENTING OF AAA IN [place]  . Junctional  cardiac arrhythmia     Occurred postoperatively after urologic surgery  . Severe sinus bradycardia     Occurred postoperatively after urologic surgery  . Aortic valve disorders   . Other primary cardiomyopathies   . Dysphagia   . Status post insertion of percutaneous endoscopic gastrostomy (PEG) tube 02/11/14  . Bacteremia due to Pseudomonas   . Infected aortic graft   . DVT (deep venous thrombosis) 04/2013    s/p IVC Filter  . Cancer 2008-2009    prostate TREATED WITH RADIATION    History  Substance Use Topics  . Smoking status: Former Smoker -- 2.00 packs/day for 37 years    Types: Cigarettes    Start date: 02/17/1949    Quit date: 03/15/1985  . Smokeless tobacco: Former Systems developer     Comment: quit 25 years ago  . Alcohol Use: No    Family History  Problem Relation Age of Onset  . Lung cancer Father    Allergies  Allergen Reactions  . Atorvastatin Other (See Comments)    Causes constipation  . Penicillins Other (See Comments)    Other reaction(s): RASH HIVES  Tolerates cefepime 2/22    OBJECTIVE: Blood pressure 134/78, pulse 83,  temperature 97.9 F (36.6 C), temperature source Oral, resp. rate 18, height 6\' 2"  (1.88 m), weight 206 lb 12.7 oz (93.8 kg), SpO2 98 %. General: he is comfortable and in good spirits Skin: No rash. New left upper arm PICC Lungs: Clear Cor: Distant heart sounds Abdomen: Soft and nontender. PEG tube site is normal. Healed midline incision  Lab Results Lab Results  Component Value Date   WBC 3.3* 05/04/2014   HGB 8.8* 05/04/2014   HCT 27.6* 05/04/2014   MCV 83.6 05/04/2014   PLT 140* 05/04/2014    Lab Results  Component Value Date   CREATININE 0.78 05/04/2014   BUN 16 05/04/2014   NA 138 05/04/2014   K 3.8 05/04/2014   CL 108 05/04/2014   CO2 24 05/04/2014    Lab Results  Component Value Date   ALT 11 04/30/2014   AST 19 04/30/2014   ALKPHOS 39 04/30/2014   BILITOT 0.4 04/30/2014     Microbiology: Recent Results (from the  past 240 hour(s))  Blood Culture (routine x 2)     Status: None   Collection Time: 04/29/14 11:15 AM  Result Value Ref Range Status   Specimen Description BLOOD LEFT ANTECUBITAL  Final   Special Requests BOTTLES DRAWN AEROBIC AND ANAEROBIC 5CC  Final   Culture   Final    PROTEUS MIRABILIS Note: SUSCEPTIBILITIES PERFORMED ON PREVIOUS CULTURE WITHIN THE LAST 5 DAYS. Note: Gram Stain Report Called to,Read Back By and Verified With: CAROL SCHEILLER RN 04/30/14 AT 14:00 BY [facility] Performed at [facility]    Report Status 05/03/2014 FINAL  Final  Blood Culture (routine x 2)     Status: None   Collection Time: 04/29/14 11:18 AM  Result Value Ref Range Status   Specimen Description BLOOD HAND LEFT  Final   Special Requests BOTTLES DRAWN AEROBIC AND ANAEROBIC 5CC  Final   Culture   Final    PROTEUS MIRABILIS Note: Gram Stain Report Called to,Read Back By and Verified With: [place] USSERY ON 2.16.2016 AT 7:55P BY [facility] Performed at [facility]    Report Status 05/03/2014 FINAL  Final   Organism ID, Bacteria PROTEUS MIRABILIS  Final      Susceptibility   Proteus mirabilis - MIC*    AMPICILLIN <=2 SENSITIVE Sensitive     AMPICILLIN/SULBACTAM <=2 SENSITIVE Sensitive     CEFAZOLIN 8 SENSITIVE Sensitive     CEFEPIME <=1 SENSITIVE Sensitive     CEFTAZIDIME <=1 SENSITIVE Sensitive     CEFTRIAXONE <=1 SENSITIVE Sensitive     CIPROFLOXACIN >=4 RESISTANT Resistant     GENTAMICIN >=16 RESISTANT Resistant     IMIPENEM 1 SENSITIVE Sensitive     PIP/TAZO <=4 SENSITIVE Sensitive     TOBRAMYCIN 8 INTERMEDIATE Intermediate     TRIMETH/SULFA >=320 RESISTANT Resistant     * PROTEUS MIRABILIS  Urine culture     Status: None   Collection Time: 04/29/14 11:51 AM  Result Value Ref Range Status   Specimen Description URINE, CATHETERIZED  Final   Special Requests NONE  Final   Colony Count NO GROWTH Performed at [facility]   Final   Culture NO GROWTH Performed at [facility]   Final   Report Status 04/30/2014 FINAL  Final  Culture, blood (routine x 2)     Status: None (Preliminary result)   Collection Time: 05/02/14  3:40 PM  Result Value Ref Range Status   Specimen Description BLOOD RIGHT ARM  Final  Special Requests BOTTLES DRAWN AEROBIC AND ANAEROBIC 10CC  Final   Culture   Final           BLOOD CULTURE RECEIVED NO GROWTH TO DATE CULTURE WILL BE HELD FOR 5 DAYS BEFORE ISSUING A FINAL NEGATIVE REPORT Performed at [facility]    Report Status PENDING  Incomplete  Culture, blood (routine x 2)     Status: None (Preliminary result)   Collection Time: 05/02/14  3:50 PM  Result Value Ref Range Status   Specimen Description BLOOD RIGHT HAND  Final   Special Requests BOTTLES DRAWN AEROBIC ONLY 10CC  Final   Culture   Final           BLOOD CULTURE RECEIVED NO GROWTH TO DATE CULTURE WILL BE HELD FOR 5 DAYS BEFORE ISSUING A FINAL NEGATIVE REPORT Performed at [facility]    Report Status PENDING  Incomplete    Assessment: He and his daughters have decided on PICC placement and outpatient IV cefepime for his chronic aortic graft infection with Pseudomonas and Proteus. Hopefully this will suppress his infection and allow him to stay out of the hospital. At some point, it may become unfeasible to maintain a PICC safely long-term. I will continue to weigh antibiotic options and the relative risks and benefits of each option with him.  Plan: 1. Discharge home on IV cefepime 2. I will arrange followup in our clinic  Michel Bickers, MD [facility] [place] [facility] [phone] pager   [phone] cell 05/07/2014, 5:02 PM

## 2014-05-07 NOTE — Clinical Social Work Note (Signed)
CSW consulted for transportation:  Patient will discharge to home Anticipated discharge date:05/07/14 Family notified: at bedside- dtr International aid/development worker by [employer]- called at [place] signing off.  Domenica Reamer, [place] Social Worker [phone]

## 2014-05-07 NOTE — Progress Notes (Signed)
  Outpatient Parenteral Antimicrobial Therapy (OPAT) Pharmacy Monitoring  Guidelines developed by the Infectious [employer] Society of [place] (IDSA) recommend that hospitals who send patients home on intravenous (IV) antibiotics have an active performance improvement program.  IDSA guidelines also recommend that an infection specialist, such as a pharmacist, be involved in the evaluation of candidates for discharge with OPAT.      Yes No Comments  Assessed patient for appropriateness of OPAT? [x]  []  Is IV antimicrobial therapy needed? Is the home environment safe and adequate to support care? Are the patient/caregivers willing to participate and able to safely, effectively, and reliably deliver parenteral antimicrobial therapy? Is communication about problems and monitoring of therapy in place with patient? Does the patient/caregiver understand the benefits/risks with OPAT?  Are oral antibiotics an option? []  [x]  Patients diagnosed with endocarditis are not candidates for oral antibiotics.  Is antibiotic selected the best option for treatment indication? [x]  []  Can therapy be de-escalated further?  Is the dose appropriate? [x]  []  Consider renal function and administration schedule for outpatient setting.  Is the length of therapy appropriate? [x]  []  Consider disease-specific guidelines when available.  Recommend changing antibiotic regimen? []  [x]  If yes, recommend _________.    Final antibiotic regimen for discharge is cefepime 2gm IV q24h (include drug, dose, route, frequency and duration).  Laboratory monitoring and follow-up for discharge with OPAT: follow-up in [facility].   Thank you for allowing pharmacy to be a part of this patient's care team.  Osias Resnick L. Nicole Kindred, PharmD Clinical Pharmacy Resident Pager: [phone] 05/07/2014 6:25 PM

## 2014-05-07 NOTE — Consult Note (Signed)
Met with the patient and daughter, [name],  at bedside to offer [facility] Management services as benefit of insurance. Patient agreeable to services and will receive post hospital discharge call and will be evaluated for monthly home visits. Consent form signed and a folder with [facility] information given.. Of note, [facility] Care Management services does not replace or interfere with any services that are arranged by inpatient case management or social work. For additional questions or referrals please contact, Natividad Brood, RN BSN CCM, [facility] Liaison at [phone].

## 2014-05-07 NOTE — Discharge Summary (Signed)
[name], 81 y.o., DOB 12/08/1929, MRN [id number]. Admission date: 04/29/2014 Discharge Date 05/07/2014 Primary MD [name], MD Admitting Physician Louellen Molder, MD   PCP please follow on: - Check CBC, BMP during next visit. - Patient is on antibiotic suppressive therapy for bacteremia( Pseudomonas and Proteus) , on IV cefepime via PICC line.  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 Hypertension   . BPH (benign prostatic hyperplasia)   . Hyperlipidemia   . AAA (abdominal aortic aneurysm) 04/2012    STENTING OF AAA IN [place]  . Junctional cardiac arrhythmia     Occurred postoperatively after urologic surgery  . Severe sinus bradycardia     Occurred postoperatively after urologic surgery  . Aortic valve disorders   . Other primary cardiomyopathies   . Dysphagia   . Status post insertion of percutaneous endoscopic gastrostomy (PEG) tube 02/11/14  . Bacteremia due to Pseudomonas   . Infected aortic graft   . DVT (deep venous thrombosis) 04/2013    s/p IVC  Filter  . Cancer 2008-2009    prostate TREATED WITH RADIATION    Past Surgical History  Procedure Laterality Date  . Abdominal surgery      PART OF COLON REMOVED FOR DIVERTICULITIS  . Appendectomy    . Hernia repair    . Bladder surgery  2008    FOR BLADDER STONE  . Total knee arthroplasty  1990    left  . Abdominal aortic aneurysm repair  04/2012  . Total knee arthroplasty Right 07/26/2012    Procedure: RIGHT TOTAL KNEE ARTHROPLASTY;  Surgeon: Tobi Bastos, MD;  Location: [facility];  Service: Orthopedics;  Laterality: Right;  . Nephrolithotomy Left 04/26/2013    Procedure: LEFT PERCUTANEOUS NEPHROLITHOTOMY ;  Surgeon: [name], MD;  Location: [facility];  Service: Urology;  Laterality: Left;  . Nephrolithotomy Left 05/03/2013    Procedure: 2ND STAGE LEFT PERCUTANEOUS NEPHROLITHOTOMY ;  Surgeon: [name], MD;  Location: [facility];  Service: Urology;  Laterality: Left;  . Esophagogastroduodenoscopy N/A 02/11/2014    AYT:KZSWF hiatal hernia. Patchy erythema of the gastric mucosa; otherwise negative EGD. Status post 12 French Microvasive PEG tube placement  . Peg placement N/A 02/11/2014    Procedure: PERCUTANEOUS ENDOSCOPIC GASTROSTOMY (PEG) PLACEMENT;  Surgeon: Daneil Dolin, MD;  Location: [facility];  Service: Endoscopy;  Laterality: N/A;  . Tee without cardioversion N/A 05/02/2014    Procedure: TRANSESOPHAGEAL ECHOCARDIOGRAM (TEE);  Surgeon: Lelon Perla, MD;  Location: [facility];  Service: Cardiovascular;  Laterality: N/A;     Hospital Course See H&P, Labs, Consult  and Test reports for all details in brief, patient was admitted for **  Principal Problem:   Sepsis Active Problems:   HTN (hypertension)   Thrombocytopenia   History of DVT of lower extremity (Feb 2015) -post IVC filter   Protein-calorie malnutrition, severe   History of Bacteremia due to Pseudomonas   Status post insertion of percutaneous endoscopic gastrostomy (PEG) tube   AAA (abdominal aortic  aneurysm)   FTT (failure to thrive) in adult   Hypotension   Malnutrition of moderate degree   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 (Last course of treatment was on 03/07/2014), Pseudomonas bacteremia, dysphagia with recurrent aspiration requiring PEG tube placement, DVT s/p IVC filter placement, hypertension, atrial flutter, dyslipidemia, systolic CHF (EF of 60%), with recurrent hospitalizations related to PNA and/or sepsis (this is 6th hospitalization in last 3 months). Patient was recently hospitalized for sepsis and discharged on high-dose oral ciprofloxacin (750 mg twice daily) with plan on palliative care follow-up at SNF. As per the son, When visiting his father morning of admission, he found him with fever and chills. Patient reports an episode of nonbloody vomiting yesterday morning, and having productive cough for the last 2-3 days. Patient has been having small amount of food at the facility besides being on PEG feeds during the night. He denies headache, dizziness, chest pain, palpitations, SOB, abdominal pain, bowel or urinary symptoms.He is nonambulatory at baseline. In ED, workup revealed T 104.6, pulse 125, RR 32, BP 81/51, and lactic acid was elevated at 2.5. UA was cloudy with mod Hgb and small leukocyte. CXR with possible left LL infilterate. EKG with sinus tachycardia At 125, and anterior T wave inversion, prolonged QTc 591. He was placed on IV Vancomycin and Cefepime, given 3L NS bolus, with improvement in BP. Pt was then admitted with sepsis for  further workup. He was later transitioned to IV vancomycin and Azactam, blood cultures growing gram-negative rods 2(Proteus) . Patient hadTEE on 2/18, with no evidence of vegetation.  Sepsis/ bacteremia (PROTEUS MIRABILIS ) -Initially with empiric vancomycin and aztreonam, stopped IV vancomycin 2/18, given blood culture growing only gram-negative rods, aztreonam switched to cefepime on 2/19 >> 2/22 by infectious disease. -High dose Cipro on recent discharge DC due to QT prolongation  - TEE 2/18 showing no vegetation -blood cultures growing PROTEUS MIRABILIS ,this is most likely related to his recurrent AAA graft infection. -urine strep/ legionella antigen negative - Repeat blood cultures 2/18 no growth to date. - Patient blood cultures did not grow Pseudomonas, but this is secondary patient taking ciprofloxacin at home, so at this point patient appears to be having Proteus and Pseudomonas bacteremia, discussed with infectious disease, very complicated infection to treat, with a very limited options , and all of these options carry significant risks and side effects, patient will need lifelong antibiotic coverage for his Pseudomonas and Proteus bacteremia secondary to infected AAA graft , if not treated will become septic again, discussed with the family ( 3 daughters) regarding risks of quinolones and QTC prolongation,  the other option would include IV antibiotics lifelong with  PICC line, with risks of DVT and infection from PICC line,  will discharge patient on IV cefepime with a PICC line.  Systolic Congestive heart failure - Appears to be euvolemic, most recent echo showing EF 25-30%. - Management per cardiology  Prolonged QTC -Increasing QTC on repeat EKG 2/16 -hold ciprofloxacin, Pepcid, Toviaz, Remeron. -Cards following -Trop x3 negative -Mag normal   DVT of lower extremity (Feb 2015) -s/p IVC filter-not on anticoagulation . - Not clear when Xarelto was stopped. - Oncology  consult appreciated, no need for further anticoagulation as patient has IVC filter as per Dr. Marin Olp.   Leukopenia/Anemia/Thrombocytopenia -Seen by oncology   AAA (abdominal aortic aneurysm) -Stable per CT scan 2/15  Chronic dysphagia -Patient with history of dysphagia, and dysphagia 3 with thin liquid -Nutrition consult for PEG tube feedings  - Continue with nocturnal tube feeds  ?UTI -UA with mod Hgb, small leukocytes, few bacteria -urine culture- no growth  History of C. Difficile -Recent stool for c diff negative. Monitor for now while on antibiotics.    Protein-calorie malnutrition, severe -Restart tube feeds per nutrition, continue with supplement      Consultants: Cardiology  Oncology  Infectious disease    Procedure/Significant Events:  TEE 2/18 1/27 echocardiogram;- LVEF= 35% to 40%. Diffuse hypokinesis. - Aortic valve: moderately stenotic.  - Left atrium: mildly to moderately dilated. 2/15 CT angiogram chest PE protocol;-Post stenting of thoracoabdominal aorta through aortic bifurcation into the iliac systems bilaterally farther on RIGHT. -Additional stenting of the celiac, SMA and renal arteries. 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 -Scattered mild pulmonary atelectasis and small pleural effusions  2/15 PCXR;Cardiomegaly with mild CHF. Hazy left lower lobe airspace disease ;atelectasis vs pneumonia.    Antibiotics: Aztreonam 2/15>> 2/19 Vancomycin 2/15>> 2/18 Cefepime 2/19>> 2/22 Levofloxacin 2/22 Keflex 2/22   Significant Tests:  See full reports for all details    Dg Chest [facility]  04/29/2014   CLINICAL DATA:  Elevated temperature.  Sepsis.  Hypertension  EXAM: PORTABLE CHEST - 1 VIEW  COMPARISON:  04/19/2014  FINDINGS: Elevated left hemidiaphragm. Mild bilateral interstitial prominence. Hazy  left lower lobe airspace disease. No pleural effusion or pneumothorax. Stable cardiomediastinal silhouette. Descending thoracic aortic stent graft. No acute osseous abnormality.  IMPRESSION: Cardiomegaly with mild CHF. Hazy left lower lobe airspace disease which may reflect atelectasis versus pneumonia.   Electronically Signed   By: Kathreen Devoid   On: 04/29/2014 11:26   Dg Chest Port 1 View  04/19/2014   CLINICAL DATA:  Pulmonary infiltrates.  EXAM: PORTABLE CHEST - 1 VIEW  COMPARISON:  04/18/2014.  FINDINGS: Persistent severe cardiomegaly with bilateral pulmonary infiltrates and right pleural effusion consistent with congestive heart failure appear underlying basilar pneumonia cannot be excluded. Aortic stent graft in stable position. No pneumothorax.  IMPRESSION: 1. Persistent severe cardiomegaly bilateral pulmonary alveolar infiltrates and small right pleural effusion consistent with congestive heart failure bibasilar pneumonia cannot be excluded. 2. Aortic stent graft in stable position.   Electronically Signed   By: Marcello Moores  Register   On: 04/19/2014 07:26   Dg Chest Port 1 View  04/18/2014   CLINICAL DATA:  Bilateral airspace disease, history of thoracic aortic aneurysm treated with stent graft  EXAM: PORTABLE CHEST - 1 VIEW  COMPARISON:  Portable chest x-ray of April 17, 2014  FINDINGS: The lungs are better inflated today. The interstitial markings remain  increased. The pulmonary vascularity remains engorged. The cardiopericardial silhouette remains enlarged. There are small bilateral pleural effusions layering posteriorly. The bony thorax is unremarkable.  IMPRESSION: Persistent findings of congestive heart failure with pulmonary interstitial edema and small bilateral pleural effusions. Bibasilar subsegmental atelectasis or pneumonia is unchanged.   Electronically Signed   By: David  Martinique   On: 04/18/2014 07:31   Dg Chest Port 1 View  04/17/2014   CLINICAL DATA:  Fever.  EXAM: PORTABLE CHEST - 1  VIEW  COMPARISON:  04/09/2014  FINDINGS: Cardiomediastinal contours with aortic stent graft in place, unchanged from prior exam. Mildly improved right basilar opacity with residual atelectasis. Unchanged atelectasis at the left lung base. No large pleural effusion or pneumothorax. Osseous structures are stable.  IMPRESSION: Improved right basilar aeration. No new consolidation to suggest progressive or new pneumonia.   Electronically Signed   By: Jeb Levering M.D.   On: 04/17/2014 04:15   Dg Chest Port 1 View  04/09/2014   CLINICAL DATA:  Altered mental status  EXAM: PORTABLE CHEST - 1 VIEW  COMPARISON:  Portable exam 1801 hr compared to 03/24/2014  FINDINGS: Atherosclerotic calcification aorta with prior aortic stenting.  Stable heart size and mediastinal contours.  Increased LEFT basilar atelectasis versus infiltrate.  Mild persistent opacity in RIGHT lower lobe little changed.  Upper lungs clear.  No gross pleural effusion or pneumothorax.  IMPRESSION: Persistent RIGHT basilar opacity with increased atelectasis versus infiltrate in LEFT lower lobe.   Electronically Signed   By: Lavonia Dana M.D.   On: 04/09/2014 18:16   Ct Angio Chest Aortic Dissect W &/or W/o  04/29/2014   CLINICAL DATA:  Thoracoabdominal aortic aneurysm post stenting, recurrent graft infection, fever, hypotension, generalized abdominal pain, sepsis  EXAM: CT ANGIOGRAPHY CHEST, ABDOMEN AND PELVIS  CTA ABDOMEN AND PELVIS WITHOUT AND WITH CONTRAST  TECHNIQUE: Multidetector CT imaging through the chest, abdomen and pelvis was performed using the standard protocol during bolus administration of intravenous contrast. Multiplanar reconstructed images and MIPs were obtained and reviewed to evaluate the vascular anatomy.  CONTRAST:  156mL OMNIPAQUE IOHEXOL 350 MG/ML SOLN IV  COMPARISON:  02/26/2014  FINDINGS: CTA CHEST FINDINGS  Extensive atherosclerotic calcifications aorta, coronary arteries and proximal great vessels.  Endograft identified  beginning at proximal descending thoracic aorta extending into abdomen.  No high attenuation mural crescent identified on precontrast imaging.  Descending thoracic aorta stable in size.  Aneurysmal dilatation of the ascending thoracic aorta measuring 4.7 cm transverse immediately proximal to aortic arch, 4.5 cm on previous exam.  Minimal low-attenuation pericardial effusion.  No evidence of dissection or periaortic hemorrhage.  Scattered atelectasis greater in lower lobes and lateral RIGHT upper lobe.  Small BILATERAL pleural effusions.  Minimal patchy infiltrate in lingula.  No thoracic adenopathy or pneumothorax.  Bones demineralized.  Review of the MIP images confirms the above findings.  CTA ABDOMEN AND PELVIS FINDINGS  Aortic endograft extends from distal aspect of aortic arch through abdominal aorta and aortic bifurcation into the LEFT common iliac artery and RIGHT internal and external iliac arteries.  Aneurysmal dilatation of the proximal abdominal aorta beginning below the diaphragmatic hiatus and extending to the SMA origin, 6.9 x 6.4 cm image 121 previously 6.9 x 6.1 cm.  Second fusiform focus of aortic dilatation at distal abdominal aorta at bifurcation, 4.1 x 4.6 cm image 200, previously 4.4 x 3.9 cm.  Third area of aneurysmal dilatation located at RIGHT common iliac artery, 4.3 cm diameter image 223, previously 4.4 cm.  Several small foci of calcification are seen within the aneurysm thrombus without definite evidence of endoleak or acute perianeurysmal hemorrhage.  Infiltration of periaortic fat anterior to the proximal abdominal aortic aneurysm at the level of the celiac artery appears unchanged, question related to prior aneurysm rupture/hemorrhage, perianeurysmal fibrosis or infection.  Small pseudoaneurysm identified at the celiac stent on 01/03/2014 exam is not visualized on the current exam nor on the previous study of 02/26/2014 due to interval intervention.  No discrete fluid collection to  suggest abscess is identified.  LEFT renal cyst stable.  Liver, spleen, atrophic pancreas, kidneys, and adrenal glands otherwise unremarkable.  Gastrostomy tube present.  Bowel loops suboptimally assessed due to lack of GI contrast, no gross abnormality seen.  No additional mass, adenopathy, free fluid, or free air.  IVC filter noted.  Degenerative disc and facet disease changes thoracolumbar spine.  Review of the MIP images confirms the above findings.  IMPRESSION:  Post stenting of thoracoabdominal aorta through aortic bifurcation into the iliac systems bilaterally farther on RIGHT.  Additional stenting of the celiac, SMA and renal arteries.  Areas of aortic dilatation appear fairly stable in sizes since previous exam.  Perianeurysmal infiltration centered at the level of the celiac artery appears little changed from the previous exam favor related to prior hemorrhage and/or fibrosis, less likely infection.  No discrete abscess collection is identified though graft infection cannot be excluded by CT.  Scattered mild pulmonary atelectasis and small pleural effusions as above.   Electronically Signed   By: Lavonia Dana M.D.   On: 04/29/2014 15:25   Ct Cta Abd/pel W/cm &/or W/o Cm  04/29/2014   CLINICAL DATA:  Thoracoabdominal aortic aneurysm post stenting, recurrent graft infection, fever, hypotension, generalized abdominal pain, sepsis  EXAM: CT ANGIOGRAPHY CHEST, ABDOMEN AND PELVIS  CTA ABDOMEN AND PELVIS WITHOUT AND WITH CONTRAST  TECHNIQUE: Multidetector CT imaging through the chest, abdomen and pelvis was performed using the standard protocol during bolus administration of intravenous contrast. Multiplanar reconstructed images and MIPs were obtained and reviewed to evaluate the vascular anatomy.  CONTRAST:  168mL OMNIPAQUE IOHEXOL 350 MG/ML SOLN IV  COMPARISON:  02/26/2014  FINDINGS: CTA CHEST FINDINGS  Extensive atherosclerotic calcifications aorta, coronary arteries and proximal great vessels.  Endograft  identified beginning at proximal descending thoracic aorta extending into abdomen.  No high attenuation mural crescent identified on precontrast imaging.  Descending thoracic aorta stable in size.  Aneurysmal dilatation of the ascending thoracic aorta measuring 4.7 cm transverse immediately proximal to aortic arch, 4.5 cm on previous exam.  Minimal low-attenuation pericardial effusion.  No evidence of dissection or periaortic hemorrhage.  Scattered atelectasis greater in lower lobes and lateral RIGHT upper lobe.  Small BILATERAL pleural effusions.  Minimal patchy infiltrate in lingula.  No thoracic adenopathy or pneumothorax.  Bones demineralized.  Review of the MIP images confirms the above findings.  CTA ABDOMEN AND PELVIS FINDINGS  Aortic endograft extends from distal aspect of aortic arch through abdominal aorta and aortic bifurcation into the LEFT common iliac artery and RIGHT internal and external iliac arteries.  Aneurysmal dilatation of the proximal abdominal aorta beginning below the diaphragmatic hiatus and extending to the SMA origin, 6.9 x 6.4 cm image 121 previously 6.9 x 6.1 cm.  Second fusiform focus of aortic dilatation at distal abdominal aorta at bifurcation, 4.1 x 4.6 cm image 200, previously 4.4 x 3.9 cm.  Third area of aneurysmal dilatation located at RIGHT common iliac artery, 4.3 cm diameter image 223, previously 4.4  cm.  Several small foci of calcification are seen within the aneurysm thrombus without definite evidence of endoleak or acute perianeurysmal hemorrhage.  Infiltration of periaortic fat anterior to the proximal abdominal aortic aneurysm at the level of the celiac artery appears unchanged, question related to prior aneurysm rupture/hemorrhage, perianeurysmal fibrosis or infection.  Small pseudoaneurysm identified at the celiac stent on 01/03/2014 exam is not visualized on the current exam nor on the previous study of 02/26/2014 due to interval intervention.  No discrete fluid  collection to suggest abscess is identified.  LEFT renal cyst stable.  Liver, spleen, atrophic pancreas, kidneys, and adrenal glands otherwise unremarkable.  Gastrostomy tube present.  Bowel loops suboptimally assessed due to lack of GI contrast, no gross abnormality seen.  No additional mass, adenopathy, free fluid, or free air.  IVC filter noted.  Degenerative disc and facet disease changes thoracolumbar spine.  Review of the MIP images confirms the above findings.  IMPRESSION:  Post stenting of thoracoabdominal aorta through aortic bifurcation into the iliac systems bilaterally farther on RIGHT.  Additional stenting of the celiac, SMA and renal arteries.  Areas of aortic dilatation appear fairly stable in sizes since previous exam.  Perianeurysmal infiltration centered at the level of the celiac artery appears little changed from the previous exam favor related to prior hemorrhage and/or fibrosis, less likely infection.  No discrete abscess collection is identified though graft infection cannot be excluded by CT.  Scattered mild pulmonary atelectasis and small pleural effusions as above.   Electronically Signed   By: Lavonia Dana M.D.   On: 04/29/2014 15:25   Ct Angio Abd/pel W/ And/or W/o  04/19/2014   CLINICAL DATA:  Diffuse abdominal pain, sepsis and history of prior endovascular thoracoabdominal aneurysm repair in July, 1856 complicated by Pseudomonas graft infection and pseudoaneurysm of the celiac axis.  EXAM: CTA ABDOMEN AND PELVIS WITHOUT CONTRAST  TECHNIQUE: Multidetector CT imaging of the abdomen and pelvis was performed using the standard protocol during bolus administration of intravenous contrast. Multiplanar reconstructed images and MIPs were obtained and reviewed to evaluate the vascular anatomy.  CONTRAST:  133mL OMNIPAQUE IOHEXOL 350 MG/ML SOLN  COMPARISON:  CTA of the chest, abdomen and pelvis on 02/26/2014 and multiple other imaging studies.  FINDINGS: Complex thoracoabdominal demonstrated  endograft placement again noted. Only the lower portion of the descending thoracic component is visualized on the study. The endograft shows stable patency and positioning. Fenestration with stenting of the celiac axis, superior mesenteric artery, bilateral single renal arteries and graft extensions into the right internal and external iliac arteries show normal patency.  Stable soft tissue thickening remains around the celiac axis with stent extension into the common hepatic artery. There no longer is contrast opacification of a separate pseudoaneurysm as depicted by CTA October, 2015. No new pseudoaneurysms identified. No evidence of abnormal air surrounding the endograft. No abnormal fluid collections.  On the arterial phase of imaging, no obvious endoleak is identified. A delayed sequence was not obtained on the current study. Native right common iliac aneurysm sac remains thrombosed. Distal left common iliac limb and both right internal and external iliac limb landing zones show normal patency and apposition. Native iliac and common femoral arteries showed no significant disease.  The visualized lung bases show small bilateral pleural effusions and bibasilar atelectasis. The liver, gallbladder, spleen and adrenal glands are unremarkable. There is some motion artifact on the study and the arms were also along the side of the body. There may be some subtle stranding  in the fat adjacent to the tail of the pancreas. Correlation suggested with any clinical evidence of acute pancreatitis.  Bowel is unremarkable with no evidence of obstruction or thickening. No free air is identified. There is a gastrostomy tube positioned in the body of the stomach. An IVC filter is in place in the infrarenal IVC. The bladder is unremarkable. No hernias are identified. Bony structures show mild spondylosis of the lower thoracic and lumbar spine.  Review of the MIP images confirms the above findings.  IMPRESSION: 1. No further  opacification of celiac axis pseudoaneurysm following extend extension into the common hepatic artery. There remains soft tissue thickening around the celiac axis at the level of prior mycotic pseudoaneurysm. No abnormal fluid collection is identified. 2. Stable patency of complex fenestrated thoracoabdominal endograft extending into native iliac arteries bilaterally. 3. Small bilateral pleural effusions. 4. Motion artifact versus potential subtle inflammatory stranding adjacent to the tail of the pancreas. Recommend correlation with any clinical evidence of pancreatitis.   Electronically Signed   By: Aletta Edouard M.D.   On: 04/19/2014 14:00     Today   Subjective:   Zaquan Duffner today has no headache,no chest abdominal pain,no new weakness tingling or numbness .  Objective:   Blood pressure 114/79, pulse 83, temperature 98.2 F (36.8 C), temperature source Oral, resp. rate 18, height 6\' 2"  (1.88 m), weight 93.8 kg (206 lb 12.7 oz), SpO2 95 %.  Intake/Output Summary (Last 24 hours) at 05/07/14 1232 Last data filed at 05/07/14 4097  Gross per 24 hour  Intake      0 ml  Output    300 ml  Net   -300 ml    Exam  General: A/O 4, NAD, No acute respiratory distress Lungs: diffuse rhonchi, decreased breath sounds LLL, with wheezes RUL  Cardiovascular: Regular rate and rhythm . Murmur heard Abdomen: Nontender, nondistended, soft, bowel sounds positive, no rebound, no ascites, no appreciable mass, PEG tube in place negative sign of infection Extremities: No significant cyanosis, clubbing; bilateral mild pedal edema  Data Review   Cultures -   CBC w Diff:  Lab Results  Component Value Date   WBC 3.3* 05/04/2014   WBC 5.8 12/26/2013   WBC 5.4 02/28/2007   HGB 8.8* 05/04/2014   HGB 11.0* 12/26/2013   HGB 15.0 02/28/2007   HCT 27.6* 05/04/2014   HCT 34.5* 12/26/2013   HCT 44.0 02/28/2007   PLT 140* 05/04/2014   PLT 157 12/26/2013   PLT 153 02/28/2007   LYMPHOPCT 13 04/30/2014    LYMPHOPCT 15.2 12/26/2013   LYMPHOPCT 28.6 02/28/2007   MONOPCT 9 04/30/2014   MONOPCT 10.0 12/26/2013   MONOPCT 11.5 02/28/2007   EOSPCT 2 04/30/2014   EOSPCT 1.7 12/26/2013   EOSPCT 4.6 02/28/2007   BASOPCT 1 04/30/2014   BASOPCT 0.3 12/26/2013   BASOPCT 0.6 02/28/2007   CMP:  Lab Results  Component Value Date   NA 138 05/04/2014   NA 140 10/31/2013   K 3.8 05/04/2014   K 3.9 10/31/2013   CL 108 05/04/2014   CL 103 10/31/2013   CO2 24 05/04/2014   CO2 27 10/31/2013   BUN 16 05/04/2014   BUN 17 10/31/2013   CREATININE 0.78 05/04/2014   CREATININE 1.1 10/31/2013   PROT 5.0* 04/30/2014   ALBUMIN 2.0* 04/30/2014   BILITOT 0.4 04/30/2014   ALKPHOS 39 04/30/2014   AST 19 04/30/2014   ALT 11 04/30/2014  .  Micro Results Recent Results (from the  past 240 hour(s))  Blood Culture (routine x 2)     Status: None   Collection Time: 04/29/14 11:15 AM  Result Value Ref Range Status   Specimen Description BLOOD LEFT ANTECUBITAL  Final   Special Requests BOTTLES DRAWN AEROBIC AND ANAEROBIC 5CC  Final   Culture   Final    PROTEUS MIRABILIS Note: SUSCEPTIBILITIES PERFORMED ON PREVIOUS CULTURE WITHIN THE LAST 5 DAYS. Note: Gram Stain Report Called to,Read Back By and Verified With: CAROL SCHEILLER RN 04/30/14 AT 14:00 BY [facility] Performed at [facility]    Report Status 05/03/2014 FINAL  Final  Blood Culture (routine x 2)     Status: None   Collection Time: 04/29/14 11:18 AM  Result Value Ref Range Status   Specimen Description BLOOD HAND LEFT  Final   Special Requests BOTTLES DRAWN AEROBIC AND ANAEROBIC 5CC  Final   Culture   Final    PROTEUS MIRABILIS Note: Gram Stain Report Called to,Read Back By and Verified With: [place] USSERY ON 2.16.2016 AT 7:55P BY [facility] Performed at [facility]    Report Status 05/03/2014 FINAL  Final   Organism ID, Bacteria PROTEUS MIRABILIS  Final      Susceptibility   Proteus mirabilis - MIC*    AMPICILLIN <=2 SENSITIVE  Sensitive     AMPICILLIN/SULBACTAM <=2 SENSITIVE Sensitive     CEFAZOLIN 8 SENSITIVE Sensitive     CEFEPIME <=1 SENSITIVE Sensitive     CEFTAZIDIME <=1 SENSITIVE Sensitive     CEFTRIAXONE <=1 SENSITIVE Sensitive     CIPROFLOXACIN >=4 RESISTANT Resistant     GENTAMICIN >=16 RESISTANT Resistant     IMIPENEM 1 SENSITIVE Sensitive     PIP/TAZO <=4 SENSITIVE Sensitive     TOBRAMYCIN 8 INTERMEDIATE Intermediate     TRIMETH/SULFA >=320 RESISTANT Resistant     * PROTEUS MIRABILIS  Urine culture     Status: None   Collection Time: 04/29/14 11:51 AM  Result Value Ref Range Status   Specimen Description URINE, CATHETERIZED  Final   Special Requests NONE  Final   Colony Count NO GROWTH Performed at [facility]   Final   Culture NO GROWTH Performed at [facility]   Final   Report Status 04/30/2014 FINAL  Final  Culture, blood (routine x 2)     Status: None (Preliminary result)   Collection Time: 05/02/14  3:40 PM  Result Value Ref Range Status   Specimen Description BLOOD RIGHT ARM  Final   Special Requests BOTTLES DRAWN AEROBIC AND ANAEROBIC 10CC  Final   Culture   Final           BLOOD CULTURE RECEIVED NO GROWTH TO DATE CULTURE WILL BE HELD FOR 5 DAYS BEFORE ISSUING A FINAL NEGATIVE REPORT Performed at [facility]    Report Status PENDING  Incomplete  Culture, blood (routine x 2)     Status: None (Preliminary result)   Collection Time: 05/02/14  3:50 PM  Result Value Ref Range Status   Specimen Description BLOOD RIGHT HAND  Final   Special Requests BOTTLES DRAWN AEROBIC ONLY 10CC  Final   Culture   Final           BLOOD CULTURE RECEIVED NO GROWTH TO DATE CULTURE WILL BE HELD FOR 5 DAYS BEFORE ISSUING A FINAL NEGATIVE REPORT Performed at [facility]    Report Status PENDING  Incomplete     Discharge Instructions  Follow-up Information    Follow up with [name], MD. Schedule an appointment as soon as possible for a visit in  1 week.   Specialty:  Internal Medicine   Why:  Posthospitalization follow-up   Contact information:   [address] [place] [address] [phone]       Discharge Medications     Medication List    STOP taking these medications        ciprofloxacin 750 MG tablet  Commonly known as:  CIPRO     doxazosin 4 MG tablet  Commonly known as:  CARDURA     famotidine 20 MG tablet  Commonly known as:  PEPCID     mirtazapine 30 MG tablet  Commonly known as:  REMERON     sodium phosphate enema  Commonly known as:  FLEET     tolterodine 4 MG 24 hr capsule  Commonly known as:  DETROL LA     triamcinolone cream 0.1 %  Commonly known as:  KENALOG      TAKE these medications        acetaminophen 500 MG tablet  Commonly known as:  TYLENOL  Take 1,000 mg by mouth every 6 (six) hours as needed for mild pain or moderate pain.     albuterol (2.5 MG/3ML) 0.083% nebulizer solution  Commonly known as:  PROVENTIL  Take 3 mLs (2.5 mg total) by nebulization every 2 (two) hours as needed for wheezing or shortness of breath.     bisacodyl 10 MG suppository  Commonly known as:  DULCOLAX  Place 1 suppository (10 mg total) rectally as needed for mild constipation or moderate constipation.     ceFEPIme 2 g in dextrose 5 % 50 mL  Inject 2 g into the vein every 12 (twelve) hours.     feeding supplement (JEVITY 1.2 CAL) Liqd  Place 1,000 mLs into feeding tube daily.     feeding supplement (PRO-STAT SUGAR FREE 64) Liqd  Take 30 mLs by mouth 3 (three) times daily with meals.     free water Soln  Place 200 mLs into feeding tube 4 (four) times daily.     lactulose 10 GM/15ML solution  Commonly known as:  CHRONULAC  Take 15 mLs (10 g total) by mouth daily. Every day per MAR     magnesium hydroxide 400 MG/5ML suspension  Commonly known as:  MILK OF MAGNESIA  Take 30 mLs by mouth daily as needed for mild constipation.     magnesium oxide 400 (241.3 MG) MG tablet   Commonly known as:  MAG-OX  Take 0.5 tablets (200 mg total) by mouth daily.     methylphenidate 5 MG tablet  Commonly known as:  RITALIN  Take 1 tablet (5 mg total) by mouth daily.     simvastatin 20 MG tablet  Commonly known as:  ZOCOR  Take 1 tablet (20 mg total) by mouth daily.         Total Time in preparing paper work, data evaluation and todays exam - 35 minutes  Janin Kozlowski M.D on 05/07/2014 at 12:32 PM  [facility]  [phone]

## 2014-05-08 ENCOUNTER — Emergency Department (HOSPITAL_COMMUNITY)
Admission: EM | Admit: 2014-05-08 | Discharge: 2014-05-09 | Disposition: A | Discharge status: Home / Self Care | Payer: Medicare Other | Attending: Emergency Medicine | Admitting: Emergency Medicine

## 2014-05-08 ENCOUNTER — Emergency Department (HOSPITAL_COMMUNITY): Payer: Medicare Other

## 2014-05-08 ENCOUNTER — Encounter (HOSPITAL_COMMUNITY): Payer: Self-pay | Admitting: *Deleted

## 2014-05-08 DIAGNOSIS — I10 Essential (primary) hypertension: Secondary | ICD-10-CM | POA: Insufficient documentation

## 2014-05-08 DIAGNOSIS — Z880 Allergy status to penicillin: Secondary | ICD-10-CM | POA: Insufficient documentation

## 2014-05-08 DIAGNOSIS — E785 Hyperlipidemia, unspecified: Secondary | ICD-10-CM | POA: Diagnosis not present

## 2014-05-08 DIAGNOSIS — Z8619 Personal history of other infectious and parasitic diseases: Secondary | ICD-10-CM | POA: Diagnosis not present

## 2014-05-08 DIAGNOSIS — Z9861 Coronary angioplasty status: Secondary | ICD-10-CM | POA: Insufficient documentation

## 2014-05-08 DIAGNOSIS — R509 Fever, unspecified: Secondary | ICD-10-CM | POA: Insufficient documentation

## 2014-05-08 DIAGNOSIS — Z8546 Personal history of malignant neoplasm of prostate: Secondary | ICD-10-CM | POA: Insufficient documentation

## 2014-05-08 DIAGNOSIS — Z79899 Other long term (current) drug therapy: Secondary | ICD-10-CM | POA: Insufficient documentation

## 2014-05-08 DIAGNOSIS — R05 Cough: Secondary | ICD-10-CM | POA: Diagnosis not present

## 2014-05-08 DIAGNOSIS — Z87891 Personal history of nicotine dependence: Secondary | ICD-10-CM | POA: Insufficient documentation

## 2014-05-08 DIAGNOSIS — Z86718 Personal history of other venous thrombosis and embolism: Secondary | ICD-10-CM | POA: Diagnosis not present

## 2014-05-08 LAB — CBC WITH DIFFERENTIAL/PLATELET
BASOS ABS: 0 10*3/uL (ref 0.0–0.1)
BASOS PCT: 0 % (ref 0–1)
Eosinophils Absolute: 0 10*3/uL (ref 0.0–0.7)
Eosinophils Relative: 0 % (ref 0–5)
HCT: 34.4 % — ABNORMAL LOW (ref 39.0–52.0)
Hemoglobin: 10.8 g/dL — ABNORMAL LOW (ref 13.0–17.0)
Lymphocytes Relative: 12 % (ref 12–46)
Lymphs Abs: 0.9 10*3/uL (ref 0.7–4.0)
MCH: 27.4 pg (ref 26.0–34.0)
MCHC: 31.4 g/dL (ref 30.0–36.0)
MCV: 87.3 fL (ref 78.0–100.0)
MONO ABS: 0.7 10*3/uL (ref 0.1–1.0)
Monocytes Relative: 10 % (ref 3–12)
Neutro Abs: 5.9 10*3/uL (ref 1.7–7.7)
Neutrophils Relative %: 78 % — ABNORMAL HIGH (ref 43–77)
Platelets: 181 10*3/uL (ref 150–400)
RBC: 3.94 MIL/uL — ABNORMAL LOW (ref 4.22–5.81)
RDW: 18.3 % — AB (ref 11.5–15.5)
WBC: 7.6 10*3/uL (ref 4.0–10.5)

## 2014-05-08 LAB — CULTURE, BLOOD (ROUTINE X 2)
CULTURE: NO GROWTH
Culture: NO GROWTH

## 2014-05-08 LAB — I-STAT CG4 LACTIC ACID, ED: Lactic Acid, Venous: 1.8 mmol/L (ref 0.5–2.0)

## 2014-05-08 NOTE — ED Provider Notes (Signed)
CSN: [id number]     Arrival date & time 05/08/14  2235 History  This chart was scribed for Elijah Morn, MD by Eustaquio Maize, ED Scribe. This patient was seen in room D36C/D36C and the patient's care was started at 11:45 PM.    Chief Complaint  Patient presents with  . Fever  . Chills  . Cough   The history is provided by the patient and a relative. No language interpreter was used.    HPI Comments: [name] is a 81 y.o. male who presents to the Emergency Department complaining of fever, chills, and a cough that began earlier today. Pt had fever of 102.4 (triage temp, 98.5) according to family members. Pt had aortic aneurysm surgery in July 2015 and has been in and out of hospital since December 2015 s/p stent placement. Family states that pt had two endovascular stents placed, and the second one became infected, causing pt to be in and out of hospital. He was discharged 1 day ago from hospital and returned due to increased fever. Family denies diarrhea, signs of rash, changes in urine, altered mental status, or any other symptoms. Pt has follow up appointment with Infectious Disease, Dr. Johnnye Sima, in 3 weeks. Pt is currently on IV antibiotics and has feeding tube in place.    Last positive blood culture April 29, 2014; positive for Proteus. Negative blood culture May 02, 2014.   Past Medical History  Diagnosis Date  . Hypertension   . BPH (benign prostatic hyperplasia)   . Hyperlipidemia   . AAA (abdominal aortic aneurysm) 04/2012    STENTING OF AAA [place]  . Junctional cardiac arrhythmia     Occurred postoperatively after urologic surgery  . Severe sinus bradycardia     Occurred postoperatively after urologic surgery  . Aortic valve disorders   . Other primary cardiomyopathies   . Dysphagia   . Status post insertion of percutaneous endoscopic gastrostomy (PEG) tube 02/11/14  . Bacteremia due to Pseudomonas   . Infected aortic graft   . DVT (deep venous  thrombosis) 04/2013    s/p IVC Filter  . Cancer 2008-2009    prostate TREATED WITH RADIATION   Past Surgical History  Procedure Laterality Date  . Abdominal surgery      PART OF COLON REMOVED FOR DIVERTICULITIS  . Appendectomy    . Hernia repair    . Bladder surgery  2008    FOR BLADDER STONE  . Total knee arthroplasty  1990    left  . Abdominal aortic aneurysm repair  04/2012  . Total knee arthroplasty Right 07/26/2012    Procedure: RIGHT TOTAL KNEE ARTHROPLASTY;  Surgeon: Tobi Bastos, MD;  Location: [facility];  Service: Orthopedics;  Laterality: Right;  . Nephrolithotomy Left 04/26/2013    Procedure: LEFT PERCUTANEOUS NEPHROLITHOTOMY ;  Surgeon: [name], MD;  Location: [facility];  Service: Urology;  Laterality: Left;  . Nephrolithotomy Left 05/03/2013    Procedure: 2ND STAGE LEFT PERCUTANEOUS NEPHROLITHOTOMY ;  Surgeon: [name], MD;  Location: [facility];  Service: Urology;  Laterality: Left;  . Esophagogastroduodenoscopy N/A 02/11/2014    WUX:LKGMW hiatal hernia. Patchy erythema of the gastric mucosa; otherwise negative EGD. Status post 45 French Microvasive PEG tube placement  . Peg placement N/A 02/11/2014    Procedure: PERCUTANEOUS ENDOSCOPIC GASTROSTOMY (PEG) PLACEMENT;  Surgeon: Daneil Dolin, MD;  Location: [facility];  Service: Endoscopy;  Laterality: N/A;  . Tee without cardioversion N/A 05/02/2014  Procedure: TRANSESOPHAGEAL ECHOCARDIOGRAM (TEE);  Surgeon: Lelon Perla, MD;  Location: [facility] ENDOSCOPY;  Service: Cardiovascular;  Laterality: N/A;   Family History  Problem Relation Age of Onset  . Lung cancer Father    History  Substance Use Topics  . Smoking status: Former Smoker -- 2.00 packs/day for 37 years    Types: Cigarettes    Start date: 02/17/1949    Quit date: 03/15/1985  . Smokeless tobacco: Former Systems developer     Comment: quit 25 years ago  . Alcohol Use: No    Review of Systems A complete 10 system review of systems was obtained and all systems are  negative except as noted in the HPI and PMH.     Allergies  Atorvastatin and Penicillins  Home Medications   Prior to Admission medications   Medication Sig Start Date End Date Taking? Authorizing Provider  acetaminophen (TYLENOL) 500 MG tablet Take 1,000 mg by mouth every 6 (six) hours as needed for mild pain or moderate pain.    Historical Provider, MD  albuterol (PROVENTIL) (2.5 MG/3ML) 0.083% nebulizer solution Take 3 mLs (2.5 mg total) by nebulization every 2 (two) hours as needed for wheezing or shortness of breath. 05/07/14   Phillips Climes, MD  Amino Acids-Protein Hydrolys (FEEDING SUPPLEMENT, PRO-STAT SUGAR FREE 64,) LIQD Take 30 mLs by mouth 3 (three) times daily with meals. 05/07/14   Phillips Climes, MD  bisacodyl (DULCOLAX) 10 MG suppository Place 1 suppository (10 mg total) rectally as needed for mild constipation or moderate constipation. 05/07/14   Dawood Elgergawy, MD  ceFEPIme 2 g in dextrose 5 % 50 mL Inject 2 g into the vein every 12 (twelve) hours. 05/07/14   Phillips Climes, MD  lactulose (CHRONULAC) 10 GM/15ML solution Take 15 mLs (10 g total) by mouth daily. Every day per [facility] 05/07/14   Phillips Climes, MD  magnesium hydroxide (MILK OF MAGNESIA) 400 MG/5ML suspension Take 30 mLs by mouth daily as needed for mild constipation. 05/07/14   Phillips Climes, MD  magnesium oxide (MAG-OX) 400 (241.3 MG) MG tablet Take 0.5 tablets (200 mg total) by mouth daily. 04/23/14   Geradine Girt, DO  methylphenidate (RITALIN) 5 MG tablet Take 1 tablet (5 mg total) by mouth daily. 05/07/14   Phillips Climes, MD  Nutritional Supplements (FEEDING SUPPLEMENT, JEVITY 1.2 CAL,) LIQD Place 1,000 mLs into feeding tube daily. 05/07/14   Phillips Climes, MD  simvastatin (ZOCOR) 20 MG tablet Take 1 tablet (20 mg total) by mouth daily. 05/07/14   Phillips Climes, MD  Water For Irrigation, Sterile (FREE WATER) SOLN Place 200 mLs into feeding tube 4 (four) times daily. 05/07/14   Phillips Climes, MD    Triage Vitals: BP 103/78 mmHg  Pulse 123  Temp(Src) 98 F (36.7 C)  Resp 18  SpO2 95%  Physical Exam  Constitutional: He is oriented to person, place, and time. He appears well-developed and well-nourished. No distress.  HENT:  Head: Normocephalic and atraumatic.  Eyes: Conjunctivae and EOM are normal.  Neck: Normal range of motion. Neck supple. No tracheal deviation present.  Cardiovascular: Normal rate, regular rhythm, normal heart sounds and intact distal pulses.   Pulmonary/Chest: Effort normal. No respiratory distress. He has rhonchi.  Rhonchi right lower base.   Abdominal: Soft. He exhibits no distension. There is no tenderness.  G tube in place.   Musculoskeletal: Normal range of motion.  Full ROM major joints. No edema.   Neurological: He is alert and oriented to person, place, and time.  Skin: Skin is warm and dry.  Psychiatric: He has a normal mood and affect. His behavior is normal. Judgment normal.  Nursing note and vitals reviewed.   ED Course  Procedures (including critical care time)  DIAGNOSTIC STUDIES: Oxygen Saturation is 95% on RA, normal by my interpretation.    COORDINATION OF CARE:   11:55 PM-Discussed treatment plan which includes consult with Infectious Disease with pt at bedside and pt and family agreed to plan.   12:06 AM - Discussed case with Infection Disease, Dr. Megan Salon   12:09 AM - Discussed consult and further treatment plan with family and pt.   Labs Review Labs Reviewed  CBC WITH DIFFERENTIAL/PLATELET - Abnormal; Notable for the following:    RBC 3.94 (*)    Hemoglobin 10.8 (*)    HCT 34.4 (*)    RDW 18.3 (*)    Neutrophils Relative % 78 (*)    All other components within normal limits  COMPREHENSIVE METABOLIC PANEL - Abnormal; Notable for the following:    Glucose, Bld 109 (*)    BUN 26 (*)    Albumin 2.8 (*)    GFR calc non Af Amer 66 (*)    GFR calc Af Amer 77 (*)    All other components within normal limits  CULTURE,  BLOOD (ROUTINE X 2)  CULTURE, BLOOD (ROUTINE X 2)  URINALYSIS, ROUTINE W REFLEX MICROSCOPIC  I-STAT CG4 LACTIC ACID, ED    Imaging Review Dg Chest 2 View  05/09/2014   CLINICAL DATA:  Low grade fever and chills for 3 days.  EXAM: CHEST  2 VIEW  COMPARISON:  04/29/2014  FINDINGS: Shallow inspiration with elevation of the left hemidiaphragm. Atelectasis in the lung bases. Calcified and dilated aorta with aortic stent in place. Tortuous aorta. Left PICC line with tip over the mid SVC region. No pneumothorax. Probable emphysematous changes in the upper lungs. Cardiac enlargement without obvious vascular congestion.  IMPRESSION: Shallow inspiration with atelectasis in the lung bases. Cardiac enlargement.   Electronically Signed   By: Lucienne Capers M.D.   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  Likely infected with both Proteus and Pseudomonas.  Patient is on long-term suppressive antibiotics.  Currently on IV cefepime throughout left upper extremity PICC line.  There is no signs of infection around the PICC line.  I had long discussion with the patient's two daughters as well as infectious disease, Dr. Megan Salon.  His white blood cell count and lactate are reassuring here.  His labs are without significant abnormality.  His chest x-rays without pneumonia.  Overall the patient has had no altered mental status or other symptoms.  I suspect the patient will continue having intermittent fevers to the remainder of his life as this chronic infection is unlikely to respond to IV antibiotics alone.  The patient is very focused on being at home at this time.  He is 81 years old.  He is becoming progressively weaker.  I suspect his time is more limited  than his family realizes.  I've asked family to follow up closely with their team as well as infectious disease team.  I offered both admission to the hospital as well as discharge home at this time.  Family elected  discharge home and think this is a very reasonable course.  I think we are potentially putting the patient at more risk hospitalized in him and we are benefiting the patient.  I think at this point in his course of focused towards quality of life needs to be at the forefront.  His family is agreeable with this.  I personally performed the services described in this documentation, which was scribed in my presence. The recorded information has been reviewed and is accurate.        Elijah Morn, MD 05/09/14 [phone]

## 2014-05-08 NOTE — ED Notes (Signed)
Per family: pt had a fever at home with temp 102.4. Pt was given 200 mg tylenol at  2015. Pt was discharged from [facility] last night.

## 2014-05-09 LAB — COMPREHENSIVE METABOLIC PANEL
ALBUMIN: 2.8 g/dL — AB (ref 3.5–5.2)
ALK PHOS: 60 U/L (ref 39–117)
ALT: 19 U/L (ref 0–53)
AST: 29 U/L (ref 0–37)
Anion gap: 11 (ref 5–15)
BUN: 26 mg/dL — AB (ref 6–23)
CHLORIDE: 104 mmol/L (ref 96–112)
CO2: 21 mmol/L (ref 19–32)
Calcium: 9.7 mg/dL (ref 8.4–10.5)
Creatinine, Ser: 1.02 mg/dL (ref 0.50–1.35)
GFR calc Af Amer: 77 mL/min — ABNORMAL LOW (ref >90)
GFR calc non Af Amer: 66 mL/min — ABNORMAL LOW (ref >90)
Glucose, Bld: 109 mg/dL — ABNORMAL HIGH (ref 70–99)
Potassium: 3.6 mmol/L (ref 3.5–5.1)
Sodium: 136 mmol/L (ref 135–145)
Total Bilirubin: 0.7 mg/dL (ref 0.3–1.2)
Total Protein: 7.2 g/dL (ref 6.0–8.3)

## 2014-05-09 NOTE — ED Notes (Signed)
Pt a/o x 4 on d/c in wheelchair with family. 

## 2014-05-13 ENCOUNTER — Telehealth: Payer: Self-pay | Admitting: *Deleted

## 2014-05-13 DIAGNOSIS — E440 Moderate protein-calorie malnutrition: Secondary | ICD-10-CM

## 2014-05-13 MED ORDER — DRONABINOL 2.5 MG PO CAPS: 2.5000 mg | ORAL_CAPSULE | Freq: Two times a day (BID) | ORAL | Status: DC

## 2014-05-13 NOTE — Telephone Encounter (Signed)
Patient's daughter called in with concerns of poor appetite in patient. He isn't eating much. She states that Dr Marin Olp mentioned the possibility of an oral medication to them while in the hospital. Spoke with Dr Marin Olp and order for Marinol obtained. Relayed info to [place], who is appreciative to try something. Called into pharmacy.

## 2014-05-14 ENCOUNTER — Telehealth: Payer: Self-pay | Admitting: Hematology & Oncology

## 2014-05-14 NOTE — Telephone Encounter (Signed)
[facility] has APPROVED the DRONABINOL CAP 2.5MG   and is good until 11/14/2014   Ref: [id number]   .   COPY SCANNED

## 2014-05-15 LAB — CULTURE, BLOOD (ROUTINE X 2)
CULTURE: NO GROWTH
CULTURE: NO GROWTH

## 2014-05-20 ENCOUNTER — Ambulatory Visit: Payer: Medicare Other | Admitting: Internal Medicine

## 2014-05-20 ENCOUNTER — Encounter (HOSPITAL_COMMUNITY)
Admission: RE | Admit: 2014-05-20 | Discharge: 2014-05-20 | Disposition: A | Discharge status: Home / Self Care | Payer: Medicare Other | Source: Ambulatory Visit | Attending: Internal Medicine | Admitting: Internal Medicine

## 2014-05-21 ENCOUNTER — Ambulatory Visit (INDEPENDENT_AMBULATORY_CARE_PROVIDER_SITE_OTHER): Payer: Medicare Other | Admitting: Internal Medicine

## 2014-05-21 ENCOUNTER — Encounter: Payer: Self-pay | Admitting: Internal Medicine

## 2014-05-21 VITALS — BP 123/81 | HR 105 | Temp 97.9°F | Ht 74.0 in | Wt 209.2 lb

## 2014-05-21 DIAGNOSIS — A047 Enterocolitis due to Clostridium difficile: Secondary | ICD-10-CM

## 2014-05-21 DIAGNOSIS — A0472 Enterocolitis due to Clostridium difficile, not specified as recurrent: Secondary | ICD-10-CM

## 2014-05-21 DIAGNOSIS — T827XXD Infection and inflammatory reaction due to other cardiac and vascular devices, implants and grafts, subsequent encounter: Secondary | ICD-10-CM

## 2014-05-21 NOTE — Progress Notes (Signed)
Patient ID: [name], male   DOB: [birth year], 81 y.o.   MRN: [id number]         [facility] for Infectious Disease  Patient Active Problem List   Diagnosis Date Noted  . Bacteremia due to Gram-negative bacteria 05/02/2014    Priority: High  . Recurrent bacteremia 02/22/2014    Priority: High  . Proteus infection 05/02/2014    Priority: Medium  . History of Clostridium difficile colitis 05/02/2014    Priority: Medium  . History of endovascular stent graft for abdominal aortic aneurysm 05/02/2014    Priority: Medium  . History of Bacteremia due to Pseudomonas 02/21/2014    Priority: Medium  . S/P AAA repair 2014 with chronic graft infection 05/01/2014  . Aortic stenosis, moderate 05/01/2014  . Cardiomyopathy-EF 35-40% by echo Jan 2016 05/01/2014  . Prolonged Q-T interval on ECG 05/01/2014  . Malnutrition of moderate degree 04/30/2014  . Sepsis due to pneumonia   . Chronic systolic congestive heart failure   . AAA (abdominal aortic aneurysm) without rupture   . Congestive heart failure, chronic, left-sided 04/29/2014  . Pyrexia   . QT prolongation   . Mycotic aneurysm   . Pseudomonas infection   . Hx of Clostridium difficile infection   . Bacterial infection due to Proteus mirabilis   . Bacteria in urine 04/11/2014  . Blood poisoning   . Elevated troponin 04/10/2014  . Hypotension 04/09/2014  . FTT (failure to thrive) in adult 03/30/2014  . Hematuria 03/10/2014  . Acute blood loss anemia 03/10/2014  . Counseling regarding advanced directives and goals of care 03/01/2014  . C. difficile diarrhea 02/27/2014  . HCAP (healthcare-associated pneumonia) 02/26/2014  . AAA (abdominal aortic aneurysm)   . Sepsis 02/24/2014  . Fever presenting with conditions classified elsewhere 02/24/2014  . Palliative care encounter   . Acute on chronic combined systolic (EF 40%) and grade 1 diastolic congestive heart failure 02/22/2014  . CKD (chronic kidney disease), stage III  02/22/2014  . Septic shock 02/21/2014  . Infected aortic graft 02/21/2014  . Stage III chronic kidney disease 02/21/2014  . Dysphagia 02/21/2014  . Status post insertion of percutaneous endoscopic gastrostomy (PEG) tube 02/21/2014  . Hypokalemia 02/10/2014  . Protein-calorie malnutrition, severe 02/08/2014  . Rash and nonspecific skin eruption 02/02/2014  . Supratherapeutic INR 05/17/2013  . Anticoagulation management encounter 05/15/2013  . Aortic valve disorder   . Other primary cardiomyopathies   . 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 Thrombocytopenia 04/30/2013  . Renal stone 04/25/2013  . Osteoarthritis of right knee 07/26/2012  . Weakness of right leg 05/19/2012  . HTN (hypertension) 05/19/2012    Patient's Medications  New Prescriptions   No medications on file  Previous Medications   ACETAMINOPHEN (TYLENOL) 500 MG TABLET    Take 1,000 mg by mouth every 6 (six) hours as needed for mild pain or moderate pain.   ALBUTEROL (PROVENTIL) (2.5 MG/3ML) 0.083% NEBULIZER SOLUTION    Take 3 mLs (2.5 mg total) by nebulization every 2 (two) hours as needed for wheezing or shortness of breath.   AMINO ACIDS-PROTEIN HYDROLYS (FEEDING SUPPLEMENT, PRO-STAT SUGAR FREE 64,) LIQD    Take 30 mLs by mouth 3 (three) times daily with meals.   BISACODYL (DULCOLAX) 10 MG SUPPOSITORY    Place 1 suppository (10 mg total) rectally as needed for mild constipation or moderate constipation.   CEFEPIME 2 G IN DEXTROSE 5 % 50 ML  Inject 2 g into the vein every 12 (twelve) hours.   DRONABINOL (MARINOL) 2.5 MG CAPSULE    Take 1 capsule (2.5 mg total) by mouth 2 (two) times daily before a meal.   LACTULOSE (CHRONULAC) 10 GM/15ML SOLUTION    Take 15 mLs (10 g total) by mouth daily. Every day per [facility]   MAGNESIUM HYDROXIDE (MILK OF MAGNESIA) 400 MG/5ML SUSPENSION    Take 30 mLs by mouth daily as needed for mild constipation.   MAGNESIUM OXIDE (MAG-OX) 400 (241.3 MG) MG TABLET     Take 0.5 tablets (200 mg total) by mouth daily.   METHYLPHENIDATE (RITALIN) 5 MG TABLET    Take 1 tablet (5 mg total) by mouth daily.   NUTRITIONAL SUPPLEMENTS (FEEDING SUPPLEMENT, JEVITY 1.2 CAL,) LIQD    Place 1,000 mLs into feeding tube daily.   SIMVASTATIN (ZOCOR) 20 MG TABLET    Take 1 tablet (20 mg total) by mouth daily.   WATER FOR IRRIGATION, STERILE (FREE WATER) SOLN    Place 200 mLs into feeding tube 4 (four) times daily.  Modified Medications   No medications on file  Discontinued Medications   No medications on file    Subjective: [name] is in for his hospital follow-up visit. He is now completed 22 days of IV antibiotic therapy. He is currently on cefepime and attempt to suppress his recurrent Pseudomonas and Proteus bacteremia is related to aortic graft infection. He is feeling much better. He has more energy and has been able to be more active. He is getting around at home with his walker. He still has very poor appetite. He states that food does not taste good. He has no nausea, vomiting or diarrhea. He was started on Marinol last week but has not noted any improvement in his appetite yet. He had one fever shortly after discharge recently was seen in the emergency department. The fevers resolve spontaneously. He has not had any problems tolerating his PICC or cefepime.  Review of Systems: Pertinent items are noted in HPI.  Past Medical History  Diagnosis Date  . Hypertension   . BPH (benign prostatic hyperplasia)   . Hyperlipidemia   . AAA (abdominal aortic aneurysm) 04/2012    STENTING OF AAA IN [place]  . Junctional cardiac arrhythmia     Occurred postoperatively after urologic surgery  . Severe sinus bradycardia     Occurred postoperatively after urologic surgery  . Aortic valve disorders   . Other primary cardiomyopathies   . Dysphagia   . Status post insertion of percutaneous endoscopic gastrostomy (PEG) tube 02/11/14  . Bacteremia due to Pseudomonas     . Infected aortic graft   . DVT (deep venous thrombosis) 04/2013    s/p IVC Filter  . Cancer 2008-2009    prostate TREATED WITH RADIATION    History  Substance Use Topics  . Smoking status: Former Smoker -- 2.00 packs/day for 37 years    Types: Cigarettes    Start date: 02/17/1949    Quit date: 03/15/1985  . Smokeless tobacco: Former Systems developer     Comment: quit 25 years ago  . Alcohol Use: No    Family History  Problem Relation Age of Onset  . Lung cancer Father     Allergies  Allergen Reactions  . Atorvastatin Other (See Comments)    Causes constipation  . Penicillins Other (See Comments)    Other reaction(s): RASH HIVES  Tolerates cefepime 2/22    Objective: Temp: 97.9 F (36.6  C) (03/08 1516) Temp Source: Oral (03/08 1516) BP: 123/81 mmHg (03/08 1516) Pulse Rate: 105 (03/08 1516)  General: He is well dressed and in good spirits he looks like he is feeling better. Skin: Left arm PICC site is good Lungs: Clear Cor: Regular S1 and S2 with no murmurs Abdomen: Soft and nontender   Lab Results 05/20/2014 White blood cell count: 4300 Creatinine: 0.84 C-reactive protein: 1.5    Assessment: Mr. Fidalgo has had several episodes of Pseudomonas bacteremia and Proteus bacteremia since his aortic aneurysm repair last July. He needs lifelong suppressive antibiotic therapy. Unfortunately quinolone antibiotics are the only oral agents that could be used to suppress Pseudomonas and they are associated with prolongation of the QT interval and increase risk for arrhythmia. He has had prolonged QT intervals measured both on and off quinolone antibiotics in the past several months. Unfortunately any management strategy carries risk in this situation. If he does not use oral quinolone antibiotics he will need long-term IV antibiotic therapy with cefepime and this carries with it the risk of PICC-related infection and DVT. Using an antibiotic regimen that does not cover pseudomonas  carries a very high with of relapsing infection. Mr. Portal and his daughters are also aware that any antibiotic therapy carries a high risk of relapsing C. difficile colitis.   Plan: 1. Continue IV cefepime for now 2. Follow-up in 4 weeks   Michel Bickers, MD [facility] for [place] [phone] pager   [phone] cell 05/21/2014, 4:03 PM

## 2014-05-27 ENCOUNTER — Encounter: Payer: Self-pay | Admitting: Internal Medicine

## 2014-05-30 ENCOUNTER — Encounter: Payer: Self-pay | Admitting: Nurse Practitioner

## 2014-05-30 NOTE — Progress Notes (Signed)
Spoke w/patient's daughter who was concerned with his appetite despite having tube feedings. Per Dr. Marin Olp he can increase Marinol to 3x/day. Also advised patient if she has any further concerns she should contact their PCP or gerontologist as Dr. Marin Olp is managing his DVT status. Daughter verbalized understanding and appreciation.

## 2014-05-31 ENCOUNTER — Telehealth: Payer: Self-pay | Admitting: Licensed Clinical Social Worker

## 2014-05-31 ENCOUNTER — Other Ambulatory Visit: Payer: Self-pay | Admitting: Internal Medicine

## 2014-05-31 DIAGNOSIS — Z8619 Personal history of other infectious and parasitic diseases: Secondary | ICD-10-CM

## 2014-05-31 MED ORDER — VANCOMYCIN 50 MG/ML ORAL SOLUTION: 125.0000 mg | Freq: Four times a day (QID) | ORAL | Status: DC

## 2014-05-31 NOTE — Telephone Encounter (Signed)
I would prefer that he be treated with oral vancomycin rather than metronidazole since the current evidence suggesting is more effective. I sent in a prescription for vancomycin 125 mg to be taken 4 times daily. 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

## 2014-05-31 NOTE — Telephone Encounter (Signed)
Patient's daughter called to let Dr. Megan Salon know that the patient was diagnosed with C-DIFF last week. [place] collected a stool sample and it was positive. Patient is feeling much better now and does not have diarrhea. He was prescribed flagyl by Dr. Alyson Ingles his primary.

## 2014-06-03 ENCOUNTER — Telehealth: Payer: Self-pay | Admitting: *Deleted

## 2014-06-03 DIAGNOSIS — Z8619 Personal history of other infectious and parasitic diseases: Secondary | ICD-10-CM

## 2014-06-03 MED ORDER — VANCOMYCIN 50 MG/ML ORAL SOLUTION: 125.0000 mg | Freq: Four times a day (QID) | ORAL | Status: AC

## 2014-06-03 MED ORDER — VANCOMYCIN 50 MG/ML ORAL SOLUTION: 125.0000 mg | Freq: Four times a day (QID) | ORAL | Status: DC

## 2014-06-03 NOTE — Telephone Encounter (Signed)
Has rx been changed from Flagyl to Vancomycin?  Dr. Megan Salon has sent rx to [facility] in [place].  Pt's daughter informed.  Daughter had question about IV Cefepime continuing until pt's next OV on 06/19/14.  RN reviewed MD's last OV notes.  OV notes indicate that the IV Cefepime is to be continued until the pt's next OV.  Informed [facility] to continue IV Cefepime until 06/19/14.

## 2014-06-07 ENCOUNTER — Other Ambulatory Visit: Payer: Self-pay | Admitting: *Deleted

## 2014-06-07 DIAGNOSIS — E440 Moderate protein-calorie malnutrition: Secondary | ICD-10-CM

## 2014-06-07 MED ORDER — DRONABINOL 2.5 MG PO CAPS: 2.5000 mg | ORAL_CAPSULE | Freq: Three times a day (TID) | ORAL | Status: AC

## 2014-06-18 ENCOUNTER — Encounter: Payer: Self-pay | Admitting: Internal Medicine

## 2014-06-18 ENCOUNTER — Telehealth: Payer: Self-pay | Admitting: *Deleted

## 2014-06-18 ENCOUNTER — Ambulatory Visit (INDEPENDENT_AMBULATORY_CARE_PROVIDER_SITE_OTHER): Payer: Medicare Other | Admitting: Internal Medicine

## 2014-06-18 VITALS — BP 135/80 | HR 92 | Temp 97.9°F | Ht 74.0 in | Wt 209.0 lb

## 2014-06-18 DIAGNOSIS — Z8719 Personal history of other diseases of the digestive system: Secondary | ICD-10-CM | POA: Diagnosis not present

## 2014-06-18 DIAGNOSIS — T827XXD Infection and inflammatory reaction due to other cardiac and vascular devices, implants and grafts, subsequent encounter: Secondary | ICD-10-CM | POA: Diagnosis not present

## 2014-06-18 DIAGNOSIS — Z8619 Personal history of other infectious and parasitic diseases: Secondary | ICD-10-CM

## 2014-06-18 NOTE — Patient Instructions (Signed)
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

## 2014-06-18 NOTE — Progress Notes (Signed)
Patient ID: [name], male   DOB: 08-Feb-1930, 81 y.o.   MRN: [id number]         [facility] for Infectious Disease  Patient Active Problem List   Diagnosis Date Noted  . Bacteremia due to Gram-negative bacteria 05/02/2014    Priority: High  . Recurrent bacteremia 02/22/2014    Priority: High  . Proteus infection 05/02/2014    Priority: Medium  . History of Clostridium difficile colitis 05/02/2014    Priority: Medium  . History of endovascular stent graft for abdominal aortic aneurysm 05/02/2014    Priority: Medium  . History of Bacteremia due to Pseudomonas 02/21/2014    Priority: Medium  . S/P AAA repair 2014 with chronic graft infection 05/01/2014  . Aortic stenosis, moderate 05/01/2014  . Cardiomyopathy-EF 35-40% by echo Jan 2016 05/01/2014  . Prolonged Q-T interval on ECG 05/01/2014  . Malnutrition of moderate degree 04/30/2014  . Sepsis due to pneumonia   . Chronic systolic congestive heart failure   . AAA (abdominal aortic aneurysm) without rupture   . Congestive heart failure, chronic, left-sided 04/29/2014  . Pyrexia   . QT prolongation   . Mycotic aneurysm   . Pseudomonas infection   . Hx of Clostridium difficile infection   . Bacterial infection due to Proteus mirabilis   . Bacteria in urine 04/11/2014  . Blood poisoning   . Elevated troponin 04/10/2014  . Hypotension 04/09/2014  . FTT (failure to thrive) in adult 03/30/2014  . Hematuria 03/10/2014  . Acute blood loss anemia 03/10/2014  . Counseling regarding advanced directives and goals of care 03/01/2014  . C. difficile diarrhea 02/27/2014  . HCAP (healthcare-associated pneumonia) 02/26/2014  . AAA (abdominal aortic aneurysm)   . Sepsis 02/24/2014  . Fever presenting with conditions classified elsewhere 02/24/2014  . Palliative care encounter   . Acute on chronic combined systolic (EF 40%) and grade 1 diastolic congestive heart failure 02/22/2014  . CKD (chronic kidney disease), stage III  02/22/2014  . Septic shock 02/21/2014  . Infected aortic graft 02/21/2014  . Stage III chronic kidney disease 02/21/2014  . Dysphagia 02/21/2014  . Status post insertion of percutaneous endoscopic gastrostomy (PEG) tube 02/21/2014  . Hypokalemia 02/10/2014  . Protein-calorie malnutrition, severe 02/08/2014  . Rash and nonspecific skin eruption 02/02/2014  . Supratherapeutic INR 05/17/2013  . Anticoagulation management encounter 05/15/2013  . Aortic valve disorder   . Other primary cardiomyopathies   . 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 Thrombocytopenia 04/30/2013  . Renal stone 04/25/2013  . Osteoarthritis of right knee 07/26/2012  . Weakness of right leg 05/19/2012  . HTN (hypertension) 05/19/2012    Patient's Medications  New Prescriptions   No medications on file  Previous Medications   ACETAMINOPHEN (TYLENOL) 500 MG TABLET    Take 1,000 mg by mouth every 6 (six) hours as needed for mild pain or moderate pain.   ALBUTEROL (PROVENTIL) (2.5 MG/3ML) 0.083% NEBULIZER SOLUTION    Take 3 mLs (2.5 mg total) by nebulization every 2 (two) hours as needed for wheezing or shortness of breath.   AMINO ACIDS-PROTEIN HYDROLYS (FEEDING SUPPLEMENT, PRO-STAT SUGAR FREE 64,) LIQD    Take 30 mLs by mouth 3 (three) times daily with meals.   BISACODYL (DULCOLAX) 10 MG SUPPOSITORY    Place 1 suppository (10 mg total) rectally as needed for mild constipation or moderate constipation.   CEFEPIME 2 G IN DEXTROSE 5 % 50 ML  Inject 2 g into the vein every 12 (twelve) hours.   DRONABINOL (MARINOL) 2.5 MG CAPSULE    Take 1 capsule (2.5 mg total) by mouth 3 (three) times daily.   LACTULOSE (CHRONULAC) 10 GM/15ML SOLUTION    Take 15 mLs (10 g total) by mouth daily. Every day per [facility]   MAGNESIUM HYDROXIDE (MILK OF MAGNESIA) 400 MG/5ML SUSPENSION    Take 30 mLs by mouth daily as needed for mild constipation.   MAGNESIUM OXIDE (MAG-OX) 400 (241.3 MG) MG TABLET    Take 0.5  tablets (200 mg total) by mouth daily.   METHYLPHENIDATE (RITALIN) 5 MG TABLET    Take 1 tablet (5 mg total) by mouth daily.   NUTRITIONAL SUPPLEMENTS (FEEDING SUPPLEMENT, JEVITY 1.2 CAL,) LIQD    Place 1,000 mLs into feeding tube daily.   SIMVASTATIN (ZOCOR) 20 MG TABLET    Take 1 tablet (20 mg total) by mouth daily.   VANCOMYCIN (VANCOCIN) 50 MG/ML ORAL SOLUTION    Take 2.5 mLs (125 mg total) by mouth 4 (four) times daily.   WATER FOR IRRIGATION, STERILE (FREE WATER) SOLN    Place 200 mLs into feeding tube 4 (four) times daily.  Modified Medications   No medications on file  Discontinued Medications   No medications on file    Subjective: [name] is in for his routine follow-up visit. He continues on IV cefepime through his left arm PICC. He's had no problems with his antibiotic or PICC line other than the fact that he has had a relapse of his C. difficile colitis. He is back on oral vancomycin and is not having any diarrhea now. He's had no fever. Review of Systems: Pertinent items are noted in HPI.  Past Medical History  Diagnosis Date  . Hypertension   . BPH (benign prostatic hyperplasia)   . Hyperlipidemia   . AAA (abdominal aortic aneurysm) 04/2012    STENTING OF AAA IN [place]  . Junctional cardiac arrhythmia     Occurred postoperatively after urologic surgery  . Severe sinus bradycardia     Occurred postoperatively after urologic surgery  . Aortic valve disorders   . Other primary cardiomyopathies   . Dysphagia   . Status post insertion of percutaneous endoscopic gastrostomy (PEG) tube 02/11/14  . Bacteremia due to Pseudomonas   . Infected aortic graft   . DVT (deep venous thrombosis) 04/2013    s/p IVC Filter  . Cancer 2008-2009    prostate TREATED WITH RADIATION    History  Substance Use Topics  . Smoking status: Former Smoker -- 2.00 packs/day for 37 years    Types: Cigarettes    Start date: 02/17/1949    Quit date: 03/15/1985  . Smokeless tobacco:  Former Systems developer     Comment: quit 25 years ago  . Alcohol Use: No    Family History  Problem Relation Age of Onset  . Lung cancer Father     Allergies  Allergen Reactions  . Atorvastatin Other (See Comments)    Causes constipation  . Penicillins Other (See Comments)    Other reaction(s): RASH HIVES  Tolerates cefepime 2/22    Objective: Temp: 97.9 F (36.6 C) (04/05 0842) Temp Source: Oral (04/05 0842) BP: 135/80 mmHg (04/05 0842) Pulse Rate: 92 (04/05 0842)  General: He is alert and in no distress sitting in his wheelchair. His son, Shanon Brow, is with him. 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 He will need lifelong suppressive therapy in order to prevent relapse of his fever. His only options are IV antibiotic therapy or oral quinolone and amoxicillin therapy. As of his last hospitalization he and his daughters chose IV antibiotic therapy because of the risk of arrhythmia from worsening of his prolonged QT with quinolone antibiotic therapy. When I asked him today what he understands about his current condition he said he did not know. I talked with him and his son, Shanon Brow, again about options and he wants to continue IV cefepime. Fortunately his C. difficile colitis is under control with oral vancomycin.  Plan: 1. Continue IV cefepime indefinitely 2. Continue oral vancomycin indefinitely 3. Follow-up in 6 weeks   Michel Bickers, MD [facility] for [place] [phone] pager   [phone] cell 06/18/2014, 8:57 AM

## 2014-06-18 NOTE — Telephone Encounter (Signed)
Patient's daughter was unable to come to today's appointment with her father, asked for an update. RN relayed Dr. Hale Bogus notes. Patient's daughter's questions answered to her satisfaction. Landis Gandy, RN

## 2014-06-26 ENCOUNTER — Telehealth: Payer: Self-pay | Admitting: *Deleted

## 2014-06-26 NOTE — Telephone Encounter (Signed)
BUN up 2 points, Creatinine down to 0.99.  Per Amy, [facility] Pharmacist, not necessary to adjust Cefepime dose.  Will share results with Dr. Megan Salon.

## 2014-06-28 ENCOUNTER — Encounter: Payer: Self-pay | Admitting: Internal Medicine

## 2014-07-15 ENCOUNTER — Inpatient Hospital Stay (HOSPITAL_COMMUNITY)
Admission: EM | Admit: 2014-07-15 | Discharge: 2014-07-19 | DRG: 949 | Disposition: A | Discharge status: Home / Self Care | Payer: Medicare Other | Attending: Internal Medicine | Admitting: Internal Medicine

## 2014-07-15 ENCOUNTER — Encounter (HOSPITAL_COMMUNITY): Payer: Self-pay | Admitting: Emergency Medicine

## 2014-07-15 ENCOUNTER — Emergency Department (HOSPITAL_COMMUNITY): Payer: Medicare Other

## 2014-07-15 DIAGNOSIS — R509 Fever, unspecified: Secondary | ICD-10-CM | POA: Diagnosis not present

## 2014-07-15 DIAGNOSIS — I10 Essential (primary) hypertension: Secondary | ICD-10-CM | POA: Diagnosis present

## 2014-07-15 DIAGNOSIS — Z931 Gastrostomy status: Secondary | ICD-10-CM

## 2014-07-15 DIAGNOSIS — N179 Acute kidney failure, unspecified: Secondary | ICD-10-CM | POA: Diagnosis present

## 2014-07-15 DIAGNOSIS — Z87891 Personal history of nicotine dependence: Secondary | ICD-10-CM

## 2014-07-15 DIAGNOSIS — A419 Sepsis, unspecified organism: Secondary | ICD-10-CM | POA: Diagnosis present

## 2014-07-15 DIAGNOSIS — A047 Enterocolitis due to Clostridium difficile: Secondary | ICD-10-CM | POA: Diagnosis present

## 2014-07-15 DIAGNOSIS — Z888 Allergy status to other drugs, medicaments and biological substances status: Secondary | ICD-10-CM

## 2014-07-15 DIAGNOSIS — Y832 Surgical operation with anastomosis, bypass or graft as the cause of abnormal reaction of the patient, or of later complication, without mention of misadventure at the time of the procedure: Secondary | ICD-10-CM | POA: Diagnosis present

## 2014-07-15 DIAGNOSIS — H9190 Unspecified hearing loss, unspecified ear: Secondary | ICD-10-CM | POA: Diagnosis present

## 2014-07-15 DIAGNOSIS — B965 Pseudomonas (aeruginosa) (mallei) (pseudomallei) as the cause of diseases classified elsewhere: Secondary | ICD-10-CM | POA: Diagnosis present

## 2014-07-15 DIAGNOSIS — Z8619 Personal history of other infectious and parasitic diseases: Secondary | ICD-10-CM | POA: Diagnosis present

## 2014-07-15 DIAGNOSIS — D61818 Other pancytopenia: Secondary | ICD-10-CM | POA: Diagnosis present

## 2014-07-15 DIAGNOSIS — T827XXD Infection and inflammatory reaction due to other cardiac and vascular devices, implants and grafts, subsequent encounter: Secondary | ICD-10-CM | POA: Diagnosis not present

## 2014-07-15 DIAGNOSIS — I429 Cardiomyopathy, unspecified: Secondary | ICD-10-CM | POA: Diagnosis present

## 2014-07-15 DIAGNOSIS — Z6827 Body mass index (BMI) 27.0-27.9, adult: Secondary | ICD-10-CM

## 2014-07-15 DIAGNOSIS — Z8679 Personal history of other diseases of the circulatory system: Secondary | ICD-10-CM

## 2014-07-15 DIAGNOSIS — R197 Diarrhea, unspecified: Secondary | ICD-10-CM | POA: Insufficient documentation

## 2014-07-15 DIAGNOSIS — M40209 Unspecified kyphosis, site unspecified: Secondary | ICD-10-CM | POA: Diagnosis present

## 2014-07-15 DIAGNOSIS — R52 Pain, unspecified: Secondary | ICD-10-CM | POA: Insufficient documentation

## 2014-07-15 DIAGNOSIS — Z515 Encounter for palliative care: Secondary | ICD-10-CM

## 2014-07-15 DIAGNOSIS — Z86718 Personal history of other venous thrombosis and embolism: Secondary | ICD-10-CM

## 2014-07-15 DIAGNOSIS — Z923 Personal history of irradiation: Secondary | ICD-10-CM

## 2014-07-15 DIAGNOSIS — T827XXS Infection and inflammatory reaction due to other cardiac and vascular devices, implants and grafts, sequela: Secondary | ICD-10-CM

## 2014-07-15 DIAGNOSIS — Z7901 Long term (current) use of anticoagulants: Secondary | ICD-10-CM

## 2014-07-15 DIAGNOSIS — Z9889 Other specified postprocedural states: Secondary | ICD-10-CM

## 2014-07-15 DIAGNOSIS — B964 Proteus (mirabilis) (morganii) as the cause of diseases classified elsewhere: Secondary | ICD-10-CM | POA: Diagnosis present

## 2014-07-15 DIAGNOSIS — Z96653 Presence of artificial knee joint, bilateral: Secondary | ICD-10-CM | POA: Diagnosis present

## 2014-07-15 DIAGNOSIS — N400 Enlarged prostate without lower urinary tract symptoms: Secondary | ICD-10-CM | POA: Diagnosis present

## 2014-07-15 DIAGNOSIS — Z7982 Long term (current) use of aspirin: Secondary | ICD-10-CM

## 2014-07-15 DIAGNOSIS — A498 Other bacterial infections of unspecified site: Secondary | ICD-10-CM | POA: Diagnosis present

## 2014-07-15 DIAGNOSIS — Z880 Allergy status to penicillin: Secondary | ICD-10-CM

## 2014-07-15 DIAGNOSIS — R7881 Bacteremia: Secondary | ICD-10-CM | POA: Diagnosis present

## 2014-07-15 DIAGNOSIS — Z8546 Personal history of malignant neoplasm of prostate: Secondary | ICD-10-CM

## 2014-07-15 DIAGNOSIS — E785 Hyperlipidemia, unspecified: Secondary | ICD-10-CM | POA: Diagnosis present

## 2014-07-15 DIAGNOSIS — E440 Moderate protein-calorie malnutrition: Secondary | ICD-10-CM | POA: Diagnosis present

## 2014-07-15 LAB — I-STAT CG4 LACTIC ACID, ED: LACTIC ACID, VENOUS: 1.73 mmol/L (ref 0.5–2.0)

## 2014-07-15 MED ORDER — MORPHINE SULFATE 2 MG/ML IJ SOLN
2.0000 mg | Freq: Once | INTRAMUSCULAR | Status: AC
  Administered 2014-07-15: 2 mg via INTRAVENOUS
  Filled 2014-07-15: qty 1

## 2014-07-15 MED ORDER — LEVOFLOXACIN IN D5W 750 MG/150ML IV SOLN
750.0000 mg | Freq: Once | INTRAVENOUS | Status: AC
  Administered 2014-07-15: 750 mg via INTRAVENOUS
  Filled 2014-07-15: qty 150

## 2014-07-15 MED ORDER — SODIUM CHLORIDE 0.9 % IV BOLUS (SEPSIS)
1000.0000 mL | INTRAVENOUS | Status: DC
  Administered 2014-07-15: 1000 mL via INTRAVENOUS

## 2014-07-15 NOTE — ED Provider Notes (Signed)
CSN: [id number]     Arrival date & time 07/15/14  2234 History  This chart was scribed for Ripley Fraise, MD by Rayfield Citizen, ED Scribe. This patient was seen in room A12C/A12C and the patient's care was started at 11:11 PM.    Chief Complaint  Patient presents with  . Fall  . Fever  . Tachycardia  . Hypotension   Patient is a 81 y.o. male presenting with fall and fever. The history is provided by the patient and a relative. No language interpreter was used.  Fall This is a new problem. The current episode started more than 2 days ago. The problem occurs constantly. The problem has been rapidly worsening. Associated symptoms include headaches. Pertinent negatives include no chest pain, no abdominal pain and no shortness of breath. Exacerbated by: Back pain worsened with position changes, applied pressure. Relieved by: transient relief with 5mg  oxycodone. Treatments tried: pain medication. The treatment provided mild relief.  Fever Associated symptoms: headaches   Associated symptoms: no chest pain, no diarrhea and no vomiting    HPI Comments: [name] is a 81 y.o. male who presents to the Emergency Department complaining of upper back pain ("between my shoulder blades") after fall 4 days PTA. Patient reports that he hit his head on the grass after falling; he is unsure of any LOC but family denies LOC. Patient's daughter states patient did not have a fever until this afternoon (TMAX 106.7); she notes that patient's back pain acutely worsened today. His pain increases with applied pressure. Patient also notes headache, localized in the back of his head, beginning today. Daughter states that she has dispensed 5mg  oxycodone as needed for patient's pain with mild relief. Daughter denies diarrhea, vomiting. Patient denies abdominal pain.   Patient takes aspirin daily. He is not on anticoagulants at this time. Daughter reports recent infections; for which patient is on home antibiotics, both  orally (vancomycin) and via his PICC line (cefepime), last doses of each received between 20:30 and 21:00 tonight. Patient has an IVC filter for prior blood clots, placed Feb 2015; he does not have a defibrillator or pacemaker.   Past Medical History  Diagnosis Date  . Hypertension   . BPH (benign prostatic hyperplasia)   . Hyperlipidemia   . AAA (abdominal aortic aneurysm) 04/2012    STENTING OF AAA IN [place]  . Junctional cardiac arrhythmia     Occurred postoperatively after urologic surgery  . Severe sinus bradycardia     Occurred postoperatively after urologic surgery  . Aortic valve disorders   . Other primary cardiomyopathies   . Dysphagia   . Status post insertion of percutaneous endoscopic gastrostomy (PEG) tube 02/11/14  . Bacteremia due to Pseudomonas   . Infected aortic graft   . DVT (deep venous thrombosis) 04/2013    s/p IVC Filter  . Cancer 2008-2009    prostate TREATED WITH RADIATION   Past Surgical History  Procedure Laterality Date  . Abdominal surgery      PART OF COLON REMOVED FOR DIVERTICULITIS  . Appendectomy    . Hernia repair    . Bladder surgery  2008    FOR BLADDER STONE  . Total knee arthroplasty  1990    left  . Abdominal aortic aneurysm repair  04/2012  . Total knee arthroplasty Right 07/26/2012    Procedure: RIGHT TOTAL KNEE ARTHROPLASTY;  Surgeon: Tobi Bastos, MD;  Location: [facility];  Service: Orthopedics;  Laterality: Right;  . Nephrolithotomy Left 04/26/2013  Procedure: LEFT PERCUTANEOUS NEPHROLITHOTOMY ;  Surgeon: [name], MD;  Location: [facility];  Service: Urology;  Laterality: Left;  . Nephrolithotomy Left 05/03/2013    Procedure: 2ND STAGE LEFT PERCUTANEOUS NEPHROLITHOTOMY ;  Surgeon: [name], MD;  Location: [facility];  Service: Urology;  Laterality: Left;  . Esophagogastroduodenoscopy N/A 02/11/2014    HYQ:MVHQI hiatal hernia. Patchy erythema of the gastric mucosa; otherwise negative EGD. Status post 58 French Microvasive PEG tube  placement  . Peg placement N/A 02/11/2014    Procedure: PERCUTANEOUS ENDOSCOPIC GASTROSTOMY (PEG) PLACEMENT;  Surgeon: Daneil Dolin, MD;  [facility];  Service: Endoscopy;  Laterality: N/A;  . Tee without cardioversion N/A 05/02/2014    Procedure: TRANSESOPHAGEAL ECHOCARDIOGRAM (TEE);  Surgeon: Lelon Perla, MD;  Location: [facility] ENDOSCOPY;  Service: Cardiovascular;  Laterality: N/A;   Family History  Problem Relation Age of Onset  . Lung cancer Father    History  Substance Use Topics  . Smoking status: Former Smoker -- 2.00 packs/day for 37 years    Types: Cigarettes    Start date: 02/17/1949    Quit date: 03/15/1985  . Smokeless tobacco: Former Systems developer     Comment: quit 25 years ago  . Alcohol Use: No    Review of Systems  Constitutional: Positive for fever.  Respiratory: Negative for shortness of breath.   Cardiovascular: Negative for chest pain.  Gastrointestinal: Negative for vomiting, abdominal pain and diarrhea.  Musculoskeletal: Positive for back pain.  Neurological: Positive for headaches. Negative for syncope.  All other systems reviewed and are negative.   Allergies  Atorvastatin and Penicillins  Home Medications   Prior to Admission medications   Medication Sig Start Date End Date Taking? Authorizing Provider  acetaminophen (TYLENOL) 500 MG tablet Take 1,000 mg by mouth every 6 (six) hours as needed for mild pain or moderate pain.    Historical Provider, MD  albuterol (PROVENTIL) (2.5 MG/3ML) 0.083% nebulizer solution Take 3 mLs (2.5 mg total) by nebulization every 2 (two) hours as needed for wheezing or shortness of breath. 05/07/14   Silver Huguenin Elgergawy, MD  Amino Acids-Protein Hydrolys (FEEDING SUPPLEMENT, PRO-STAT SUGAR FREE 64,) LIQD Take 30 mLs by mouth 3 (three) times daily with meals. 05/07/14   Silver Huguenin Elgergawy, MD  bisacodyl (DULCOLAX) 10 MG suppository Place 1 suppository (10 mg total) rectally as needed for mild constipation or moderate  constipation. 05/07/14   Silver Huguenin Elgergawy, MD  ceFEPIme 2 g in dextrose 5 % 50 mL Inject 2 g into the vein every 12 (twelve) hours. 05/07/14   Silver Huguenin Elgergawy, MD  dronabinol (MARINOL) 2.5 MG capsule Take 1 capsule (2.5 mg total) by mouth 3 (three) times daily. 06/07/14   [name], MD  lactulose (CHRONULAC) 10 GM/15ML solution Take 15 mLs (10 g total) by mouth daily. Every day per [facility] 05/07/14   Silver Huguenin Elgergawy, MD  magnesium hydroxide (MILK OF MAGNESIA) 400 MG/5ML suspension Take 30 mLs by mouth daily as needed for mild constipation. 05/07/14   Silver Huguenin Elgergawy, MD  magnesium oxide (MAG-OX) 400 (241.3 MG) MG tablet Take 0.5 tablets (200 mg total) by mouth daily. 04/23/14   Geradine Girt, DO  methylphenidate (RITALIN) 5 MG tablet Take 1 tablet (5 mg total) by mouth daily. 05/07/14   Silver Huguenin Elgergawy, MD  Nutritional Supplements (FEEDING SUPPLEMENT, JEVITY 1.2 CAL,) LIQD Place 1,000 mLs into feeding tube daily. 05/07/14   Silver Huguenin Elgergawy, MD  simvastatin (ZOCOR) 20 MG tablet Take  1 tablet (20 mg total) by mouth daily. 05/07/14   Silver Huguenin Elgergawy, MD  vancomycin (VANCOCIN) 50 mg/mL oral solution Take 2.5 mLs (125 mg total) by mouth 4 (four) times daily. 06/03/14   Michel Bickers, MD  Water For Irrigation, Sterile (FREE WATER) SOLN Place 200 mLs into feeding tube 4 (four) times daily. 05/07/14   Silver Huguenin Elgergawy, MD   BP 87/73 mmHg  Pulse 125  Temp(Src) 98.1 F (36.7 C) (Oral)  Resp 38  Ht 6\' 2"  (1.88 m)  Wt 208 lb (94.348 kg)  BMI 26.69 kg/m2  SpO2 95% Physical Exam  Nursing note and vitals reviewed.   CONSTITUTIONAL: elderly, frail HEAD: Normocephalic/atraumatic EYES: EOM ENMT: Mucous membranes moist NECK: supple no meningeal signs SPINE/BACK: midline thoracic spine tenderness, No bruising/crepitance/stepoffs noted to spine CV: S1/S2 noted LUNGS: Lungs are clear to auscultation bilaterally, no apparent distress ABDOMEN: soft, nontender, no rebound or guarding, bowel  sounds noted throughout abdomen; PEG tube no surrounding drainage GU: no cva tenderness NEURO: Pt is awake/alert/appropriate, moves all extremitiesx4.  No facial droop.  No focal motor weakness in the extremities.  EXTREMITIES: pulses normal/equal, full ROM; PICC line in left arm SKIN: warm, color normal PSYCH: no abnormalities of mood noted, alert and oriented to situation  ED Course  Procedures  CRITICAL CARE Performed by: Sharyon Cable Total critical care time: 35 Critical care time was exclusive of separately billable procedures and treating other patients. Critical care was necessary to treat or prevent imminent or life-threatening deterioration. Critical care was time spent personally by me on the following activities: development of treatment plan with patient and/or surrogate as well as nursing, discussions with consultants, evaluation of patient's response to treatment, examination of patient, obtaining history from patient or surrogate, ordering and performing treatments and interventions, ordering and review of laboratory studies, ordering and review of radiographic studies, pulse oximetry and re-evaluation of patient's condition.   DIAGNOSTIC STUDIES: Oxygen Saturation is 95% on RA, normal by my interpretation.    COORDINATION OF CARE: 11:22 PM Discussed treatment plan with pt at bedside and pt agreed to plan. 11:28 PM Pt is medically complex He has PICC line in place He is on home antibiotics (vancomycin for cdif and cefepime) as he had pseudomonal infection of aortic graft He now presents with fever at home tachycardia and hypotension IV Fluids ordered He just received home antibiotics Will add on levaquin as well Will follow closely Will need imaging of spine 12:22 AM Pt is more comfortable Hypotension is resolving Imaging/labs pending 1:48 AM Pt comfortable D/w dr gardner, will admit to stepdown unit He requests infectious disease consult Pt has complicated  infection with reported fever at home  Concern for worsened bacteremia He has back pain, no signs of fracture.  Diskitis/abscess possible, but he has no focal neuro deficits Family does not think pt would tolerate MRI at this time. D/w dr gardner.  Treatment at this time should focus on appropriate antibiotics due to complex infection.  Will defer emergent MRI for now BP 98/55 mmHg  Pulse 86  Temp(Src) 98.1 F (36.7 C) (Oral)  Resp 22  Ht 6\' 2"  (1.88 m)  Wt 208 lb (94.348 kg)  BMI 26.69 kg/m2  SpO2 93%   Labs Review Labs Reviewed  CBC WITH DIFFERENTIAL/PLATELET - Abnormal; Notable for the following:    Hemoglobin 12.8 (*)    RDW 16.8 (*)    Platelets 99 (*)    Neutrophils Relative % 88 (*)    Lymphocytes  Relative 5 (*)    Lymphs Abs 0.4 (*)    All other components within normal limits  COMPREHENSIVE METABOLIC PANEL - Abnormal; Notable for the following:    CO2 21 (*)    Glucose, Bld 128 (*)    BUN 39 (*)    Creatinine, Ser 1.29 (*)    Albumin 2.7 (*)    GFR calc non Af Amer 50 (*)    GFR calc Af Amer 58 (*)    All other components within normal limits  CULTURE, BLOOD (ROUTINE X 2)  CULTURE, BLOOD (ROUTINE X 2)  URINE CULTURE  URINALYSIS, ROUTINE W REFLEX MICROSCOPIC  I-STAT CG4 LACTIC ACID, ED    Imaging Review Dg Chest 1 View  07/16/2014   CLINICAL DATA:  Upper to mid back pain after fall on Friday.  EXAM: CHEST  1 VIEW  COMPARISON:  05/08/2014  FINDINGS: There is an endoluminal descending thoracic aortic stent graft. There are unchanged moderately prominent aortic contours. There is a left upper extremity PICC line extending into the SVC. There is unchanged left hemidiaphragm elevation. There is mild interstitial coarsening and mild linear left base scarring, unchanged.  IMPRESSION: No acute cardiopulmonary findings.   Electronically Signed   By: Andreas Newport M.D.   On: 07/16/2014 00:27   Dg Thoracic Spine W/swimmers  07/16/2014   CLINICAL DATA:  Upper mid back  pain after fall on Friday  EXAM: THORACIC SPINE - 2 VIEW + SWIMMERS  COMPARISON:  None.  FINDINGS: There is no evidence of acute thoracic spine fracture. There is moderate kyphosis. There is moderately severe degenerative disc change at multiple levels. There is no bone lesion or bony destruction. The endoluminal aortic stent graft is again noted, without change and aortic contour.  IMPRESSION: Negative for acute thoracic spine fracture. Moderate kyphosis and degenerative change.   Electronically Signed   By: Andreas Newport M.D.   On: 07/16/2014 00:28      MDM   Final diagnoses:  Pain  Infected aortic graft, sequela  Sepsis, due to unspecified organism    Nursing notes including past medical history and social history reviewed and considered in documentation xrays/imaging reviewed by myself and considered during evaluation Labs/vital reviewed myself and considered during evaluation  I personally performed the services described in this documentation, which was scribed in my presence. The recorded information has been reviewed and is accurate.       Ripley Fraise, MD 07/16/14 [phone]

## 2014-07-15 NOTE — ED Notes (Addendum)
Family reports pt has a chronic infection with PICC line in place- "has had sepsis 4 times"; family reports fever today of 102.6- pt afebrile during triage assessment; family reports pt also experienced a fall on Friday but refused to be seen in ER; pt denies hitting head or LOC during fall- pain in the upper right back; pt expressing that he "is tired and wants to lie down"

## 2014-07-16 ENCOUNTER — Encounter (HOSPITAL_COMMUNITY): Payer: Self-pay | Admitting: General Practice

## 2014-07-16 DIAGNOSIS — Z7982 Long term (current) use of aspirin: Secondary | ICD-10-CM | POA: Diagnosis not present

## 2014-07-16 DIAGNOSIS — I429 Cardiomyopathy, unspecified: Secondary | ICD-10-CM | POA: Diagnosis present

## 2014-07-16 DIAGNOSIS — Z7901 Long term (current) use of anticoagulants: Secondary | ICD-10-CM | POA: Diagnosis not present

## 2014-07-16 DIAGNOSIS — Z8679 Personal history of other diseases of the circulatory system: Secondary | ICD-10-CM | POA: Diagnosis not present

## 2014-07-16 DIAGNOSIS — Z9889 Other specified postprocedural states: Secondary | ICD-10-CM | POA: Diagnosis not present

## 2014-07-16 DIAGNOSIS — Y832 Surgical operation with anastomosis, bypass or graft as the cause of abnormal reaction of the patient, or of later complication, without mention of misadventure at the time of the procedure: Secondary | ICD-10-CM

## 2014-07-16 DIAGNOSIS — T827XXS Infection and inflammatory reaction due to other cardiac and vascular devices, implants and grafts, sequela: Secondary | ICD-10-CM | POA: Diagnosis not present

## 2014-07-16 DIAGNOSIS — R7881 Bacteremia: Secondary | ICD-10-CM | POA: Diagnosis not present

## 2014-07-16 DIAGNOSIS — Z515 Encounter for palliative care: Secondary | ICD-10-CM

## 2014-07-16 DIAGNOSIS — Z87891 Personal history of nicotine dependence: Secondary | ICD-10-CM | POA: Diagnosis not present

## 2014-07-16 DIAGNOSIS — B964 Proteus (mirabilis) (morganii) as the cause of diseases classified elsewhere: Secondary | ICD-10-CM

## 2014-07-16 DIAGNOSIS — R509 Fever, unspecified: Secondary | ICD-10-CM

## 2014-07-16 DIAGNOSIS — Z880 Allergy status to penicillin: Secondary | ICD-10-CM | POA: Diagnosis not present

## 2014-07-16 DIAGNOSIS — A4150 Gram-negative sepsis, unspecified: Secondary | ICD-10-CM

## 2014-07-16 DIAGNOSIS — Z923 Personal history of irradiation: Secondary | ICD-10-CM | POA: Diagnosis not present

## 2014-07-16 DIAGNOSIS — Z6827 Body mass index (BMI) 27.0-27.9, adult: Secondary | ICD-10-CM | POA: Diagnosis not present

## 2014-07-16 DIAGNOSIS — T827XXD Infection and inflammatory reaction due to other cardiac and vascular devices, implants and grafts, subsequent encounter: Principal | ICD-10-CM

## 2014-07-16 DIAGNOSIS — H9190 Unspecified hearing loss, unspecified ear: Secondary | ICD-10-CM | POA: Diagnosis present

## 2014-07-16 DIAGNOSIS — N400 Enlarged prostate without lower urinary tract symptoms: Secondary | ICD-10-CM | POA: Diagnosis present

## 2014-07-16 DIAGNOSIS — D61818 Other pancytopenia: Secondary | ICD-10-CM | POA: Diagnosis present

## 2014-07-16 DIAGNOSIS — Z888 Allergy status to other drugs, medicaments and biological substances status: Secondary | ICD-10-CM | POA: Diagnosis not present

## 2014-07-16 DIAGNOSIS — A047 Enterocolitis due to Clostridium difficile: Secondary | ICD-10-CM | POA: Diagnosis present

## 2014-07-16 DIAGNOSIS — R197 Diarrhea, unspecified: Secondary | ICD-10-CM | POA: Diagnosis not present

## 2014-07-16 DIAGNOSIS — E440 Moderate protein-calorie malnutrition: Secondary | ICD-10-CM | POA: Diagnosis present

## 2014-07-16 DIAGNOSIS — Z8619 Personal history of other infectious and parasitic diseases: Secondary | ICD-10-CM | POA: Diagnosis not present

## 2014-07-16 DIAGNOSIS — B965 Pseudomonas (aeruginosa) (mallei) (pseudomallei) as the cause of diseases classified elsewhere: Secondary | ICD-10-CM | POA: Diagnosis present

## 2014-07-16 DIAGNOSIS — Z96653 Presence of artificial knee joint, bilateral: Secondary | ICD-10-CM | POA: Diagnosis present

## 2014-07-16 DIAGNOSIS — I10 Essential (primary) hypertension: Secondary | ICD-10-CM | POA: Diagnosis present

## 2014-07-16 DIAGNOSIS — Z86718 Personal history of other venous thrombosis and embolism: Secondary | ICD-10-CM | POA: Diagnosis not present

## 2014-07-16 DIAGNOSIS — A419 Sepsis, unspecified organism: Secondary | ICD-10-CM | POA: Diagnosis not present

## 2014-07-16 DIAGNOSIS — Z931 Gastrostomy status: Secondary | ICD-10-CM | POA: Diagnosis not present

## 2014-07-16 DIAGNOSIS — M40209 Unspecified kyphosis, site unspecified: Secondary | ICD-10-CM | POA: Diagnosis present

## 2014-07-16 DIAGNOSIS — N179 Acute kidney failure, unspecified: Secondary | ICD-10-CM | POA: Diagnosis not present

## 2014-07-16 DIAGNOSIS — R5081 Fever presenting with conditions classified elsewhere: Secondary | ICD-10-CM | POA: Diagnosis not present

## 2014-07-16 DIAGNOSIS — Z8546 Personal history of malignant neoplasm of prostate: Secondary | ICD-10-CM | POA: Diagnosis not present

## 2014-07-16 DIAGNOSIS — R52 Pain, unspecified: Secondary | ICD-10-CM | POA: Diagnosis not present

## 2014-07-16 DIAGNOSIS — I712 Thoracic aortic aneurysm, without rupture: Secondary | ICD-10-CM

## 2014-07-16 DIAGNOSIS — E785 Hyperlipidemia, unspecified: Secondary | ICD-10-CM | POA: Diagnosis present

## 2014-07-16 LAB — URINALYSIS, ROUTINE W REFLEX MICROSCOPIC
Bilirubin Urine: NEGATIVE
GLUCOSE, UA: NEGATIVE mg/dL
Ketones, ur: 15 mg/dL — AB
Leukocytes, UA: NEGATIVE
NITRITE: NEGATIVE
PH: 6 (ref 5.0–8.0)
Protein, ur: 100 mg/dL — AB
SPECIFIC GRAVITY, URINE: 1.022 (ref 1.005–1.030)
Urobilinogen, UA: 0.2 mg/dL (ref 0.0–1.0)

## 2014-07-16 LAB — CBC WITH DIFFERENTIAL/PLATELET
BASOS ABS: 0 10*3/uL (ref 0.0–0.1)
BASOS PCT: 0 % (ref 0–1)
EOS ABS: 0 10*3/uL (ref 0.0–0.7)
EOS PCT: 0 % (ref 0–5)
HCT: 39.6 % (ref 39.0–52.0)
Hemoglobin: 12.8 g/dL — ABNORMAL LOW (ref 13.0–17.0)
Lymphocytes Relative: 5 % — ABNORMAL LOW (ref 12–46)
Lymphs Abs: 0.4 10*3/uL — ABNORMAL LOW (ref 0.7–4.0)
MCH: 28.5 pg (ref 26.0–34.0)
MCHC: 32.3 g/dL (ref 30.0–36.0)
MCV: 88.2 fL (ref 78.0–100.0)
MONOS PCT: 7 % (ref 3–12)
Monocytes Absolute: 0.6 10*3/uL (ref 0.1–1.0)
NEUTROS ABS: 7.5 10*3/uL (ref 1.7–7.7)
Neutrophils Relative %: 88 % — ABNORMAL HIGH (ref 43–77)
Platelets: 99 10*3/uL — ABNORMAL LOW (ref 150–400)
RBC: 4.49 MIL/uL (ref 4.22–5.81)
RDW: 16.8 % — ABNORMAL HIGH (ref 11.5–15.5)
WBC: 8.5 10*3/uL (ref 4.0–10.5)

## 2014-07-16 LAB — COMPREHENSIVE METABOLIC PANEL
ALK PHOS: 80 U/L (ref 38–126)
ALT: 21 U/L (ref 17–63)
AST: 32 U/L (ref 15–41)
Albumin: 2.7 g/dL — ABNORMAL LOW (ref 3.5–5.0)
Anion gap: 11 (ref 5–15)
BILIRUBIN TOTAL: 1 mg/dL (ref 0.3–1.2)
BUN: 39 mg/dL — ABNORMAL HIGH (ref 6–20)
CO2: 21 mmol/L — ABNORMAL LOW (ref 22–32)
Calcium: 9.8 mg/dL (ref 8.9–10.3)
Chloride: 105 mmol/L (ref 101–111)
Creatinine, Ser: 1.29 mg/dL — ABNORMAL HIGH (ref 0.61–1.24)
GFR calc non Af Amer: 50 mL/min — ABNORMAL LOW (ref >60)
GFR, EST AFRICAN AMERICAN: 58 mL/min — AB (ref >60)
GLUCOSE: 128 mg/dL — AB (ref 70–99)
POTASSIUM: 3.6 mmol/L (ref 3.5–5.1)
SODIUM: 137 mmol/L (ref 135–145)
Total Protein: 7.6 g/dL (ref 6.5–8.1)

## 2014-07-16 LAB — BASIC METABOLIC PANEL
Anion gap: 11 (ref 5–15)
BUN: 38 mg/dL — ABNORMAL HIGH (ref 6–20)
CO2: 20 mmol/L — ABNORMAL LOW (ref 22–32)
Calcium: 9.1 mg/dL (ref 8.9–10.3)
Chloride: 106 mmol/L (ref 101–111)
Creatinine, Ser: 1.27 mg/dL — ABNORMAL HIGH (ref 0.61–1.24)
GFR calc Af Amer: 59 mL/min — ABNORMAL LOW (ref >60)
GFR, EST NON AFRICAN AMERICAN: 51 mL/min — AB (ref >60)
GLUCOSE: 118 mg/dL — AB (ref 70–99)
POTASSIUM: 4.1 mmol/L (ref 3.5–5.1)
SODIUM: 137 mmol/L (ref 135–145)

## 2014-07-16 LAB — URINE MICROSCOPIC-ADD ON

## 2014-07-16 LAB — CBC
HEMATOCRIT: 32.2 % — AB (ref 39.0–52.0)
HEMOGLOBIN: 10.6 g/dL — AB (ref 13.0–17.0)
MCH: 28.6 pg (ref 26.0–34.0)
MCHC: 32.9 g/dL (ref 30.0–36.0)
MCV: 86.8 fL (ref 78.0–100.0)
Platelets: 80 10*3/uL — ABNORMAL LOW (ref 150–400)
RBC: 3.71 MIL/uL — AB (ref 4.22–5.81)
RDW: 16.9 % — ABNORMAL HIGH (ref 11.5–15.5)
WBC: 9.3 10*3/uL (ref 4.0–10.5)

## 2014-07-16 LAB — MRSA PCR SCREENING: MRSA by PCR: POSITIVE — AB

## 2014-07-16 MED ORDER — HEPARIN SODIUM (PORCINE) 5000 UNIT/ML IJ SOLN
5000.0000 [IU] | Freq: Three times a day (TID) | INTRAMUSCULAR | Status: DC
  Administered 2014-07-16 – 2014-07-19 (×11): 5000 [IU] via SUBCUTANEOUS
  Filled 2014-07-16 (×13): qty 1

## 2014-07-16 MED ORDER — ASPIRIN EC 81 MG PO TBEC
81.0000 mg | DELAYED_RELEASE_TABLET | Freq: Every day | ORAL | Status: DC
  Administered 2014-07-16 – 2014-07-19 (×4): 81 mg via ORAL
  Filled 2014-07-16 (×4): qty 1

## 2014-07-16 MED ORDER — ACETAMINOPHEN 325 MG PO TABS: 650.0000 mg | ORAL_TABLET | Freq: Four times a day (QID) | ORAL | Status: DC | PRN

## 2014-07-16 MED ORDER — VITAL HIGH PROTEIN PO LIQD: 1000.0000 mL | ORAL | Status: DC

## 2014-07-16 MED ORDER — DEXTROSE 5 % IV SOLN
2.0000 g | Freq: Two times a day (BID) | INTRAVENOUS | Status: DC
  Administered 2014-07-16 (×2): 2 g via INTRAVENOUS
  Filled 2014-07-16 (×3): qty 2

## 2014-07-16 MED ORDER — BISACODYL 10 MG RE SUPP: 10.0000 mg | RECTAL | Status: DC | PRN

## 2014-07-16 MED ORDER — FESOTERODINE FUMARATE ER 4 MG PO TB24
4.0000 mg | ORAL_TABLET | Freq: Every day | ORAL | Status: DC
  Administered 2014-07-16 – 2014-07-19 (×4): 4 mg via ORAL
  Filled 2014-07-16 (×4): qty 1

## 2014-07-16 MED ORDER — JEVITY 1.2 CAL PO LIQD
1000.0000 mL | ORAL | Status: DC
  Administered 2014-07-16 – 2014-07-18 (×3): 1000 mL
  Filled 2014-07-16 (×5): qty 1000

## 2014-07-16 MED ORDER — HYDROCODONE-ACETAMINOPHEN 5-325 MG PO TABS
1.0000 | ORAL_TABLET | ORAL | Status: DC | PRN
  Administered 2014-07-16 – 2014-07-18 (×5): 1 via ORAL
  Filled 2014-07-16 (×5): qty 1

## 2014-07-16 MED ORDER — SODIUM CHLORIDE 0.9 % IV SOLN
1500.0000 mg | Freq: Once | INTRAVENOUS | Status: AC
  Administered 2014-07-16: 1500 mg via INTRAVENOUS
  Filled 2014-07-16: qty 1500

## 2014-07-16 MED ORDER — HYDROCODONE-ACETAMINOPHEN 5-325 MG PO TABS
1.0000 | ORAL_TABLET | ORAL | Status: DC | PRN
Start: 2014-07-16 — End: 2014-07-16
  Administered 2014-07-16: 1 via ORAL
  Filled 2014-07-16: qty 1

## 2014-07-16 MED ORDER — DRONABINOL 2.5 MG PO CAPS
2.5000 mg | ORAL_CAPSULE | Freq: Three times a day (TID) | ORAL | Status: DC
  Administered 2014-07-16 – 2014-07-19 (×11): 2.5 mg via ORAL
  Filled 2014-07-16 (×11): qty 1

## 2014-07-16 MED ORDER — CETYLPYRIDINIUM CHLORIDE 0.05 % MT LIQD
7.0000 mL | Freq: Two times a day (BID) | OROMUCOSAL | Status: DC
  Administered 2014-07-16 – 2014-07-17 (×3): 7 mL via OROMUCOSAL

## 2014-07-16 MED ORDER — ACETAMINOPHEN 500 MG PO TABS: 1000.0000 mg | ORAL_TABLET | Freq: Four times a day (QID) | ORAL | Status: DC | PRN

## 2014-07-16 MED ORDER — SODIUM CHLORIDE 0.9 % IJ SOLN
3.0000 mL | Freq: Two times a day (BID) | INTRAMUSCULAR | Status: DC
  Administered 2014-07-16 – 2014-07-17 (×3): 3 mL via INTRAVENOUS

## 2014-07-16 MED ORDER — VANCOMYCIN 50 MG/ML ORAL SOLUTION
125.0000 mg | Freq: Four times a day (QID) | ORAL | Status: DC
  Administered 2014-07-16 – 2014-07-18 (×9): 125 mg via ORAL
  Filled 2014-07-16 (×12): qty 2.5

## 2014-07-16 MED ORDER — SIMVASTATIN 20 MG PO TABS
20.0000 mg | ORAL_TABLET | Freq: Every day | ORAL | Status: DC
Start: 2014-07-16 — End: 2014-07-19
  Administered 2014-07-16 – 2014-07-19 (×4): 20 mg via ORAL
  Filled 2014-07-16 (×4): qty 1

## 2014-07-16 MED ORDER — SODIUM CHLORIDE 0.9 % IV BOLUS (SEPSIS)
1000.0000 mL | Freq: Once | INTRAVENOUS | Status: DC
Start: 2014-07-16 — End: 2014-07-16

## 2014-07-16 MED ORDER — MAGNESIUM OXIDE 400 (241.3 MG) MG PO TABS
200.0000 mg | ORAL_TABLET | Freq: Every day | ORAL | Status: DC
Start: 2014-07-16 — End: 2014-07-19
  Administered 2014-07-16 – 2014-07-19 (×4): 200 mg via ORAL
  Filled 2014-07-16 (×4): qty 0.5

## 2014-07-16 MED ORDER — SODIUM CHLORIDE 0.9 % IV SOLN
INTRAVENOUS | Status: DC
  Administered 2014-07-17: 02:00:00 via INTRAVENOUS

## 2014-07-16 MED ORDER — CYCLOBENZAPRINE HCL 10 MG PO TABS
5.0000 mg | ORAL_TABLET | Freq: Three times a day (TID) | ORAL | Status: DC | PRN
  Filled 2014-07-16: qty 1

## 2014-07-16 MED ORDER — PRO-STAT SUGAR FREE PO LIQD
30.0000 mL | Freq: Three times a day (TID) | ORAL | Status: DC
  Administered 2014-07-16: 30 mL via ORAL
  Filled 2014-07-16 (×4): qty 30

## 2014-07-16 MED ORDER — DEXTROSE 5 % IV SOLN
2.0000 g | INTRAVENOUS | Status: DC
  Administered 2014-07-17: 2 g via INTRAVENOUS
  Filled 2014-07-16: qty 2

## 2014-07-16 MED ORDER — FREE WATER
200.0000 mL | Freq: Four times a day (QID) | Status: DC
  Administered 2014-07-16 – 2014-07-19 (×14): 200 mL

## 2014-07-16 MED ORDER — CHLORHEXIDINE GLUCONATE 0.12 % MT SOLN
15.0000 mL | Freq: Two times a day (BID) | OROMUCOSAL | Status: DC
  Administered 2014-07-16 – 2014-07-17 (×3): 15 mL via OROMUCOSAL
  Filled 2014-07-16 (×7): qty 15

## 2014-07-16 MED ORDER — PRO-STAT SUGAR FREE PO LIQD
30.0000 mL | Freq: Three times a day (TID) | ORAL | Status: DC
  Administered 2014-07-16 – 2014-07-19 (×10): 30 mL
  Filled 2014-07-16 (×13): qty 30

## 2014-07-16 MED ORDER — VANCOMYCIN HCL IN DEXTROSE 750-5 MG/150ML-% IV SOLN
750.0000 mg | Freq: Two times a day (BID) | INTRAVENOUS | Status: DC
  Administered 2014-07-17 – 2014-07-18 (×3): 750 mg via INTRAVENOUS
  Filled 2014-07-16 (×4): qty 150

## 2014-07-16 MED ORDER — SODIUM CHLORIDE 0.9 % IV SOLN
INTRAVENOUS | Status: DC
  Administered 2014-07-16: 03:00:00 via INTRAVENOUS

## 2014-07-16 NOTE — Consult Note (Signed)
Patient [name]      DOB: 06/10/1929      ZJQ:[id number]     Consult Note from the Palliative Medicine Team at [facility] Requested by: Dr Algis Liming     PCP: [name], MD [facility] for [place]     Phone Number:[phone]  Assessment/Recommendations: Patient is a 81 yo male with PMHx of thoraco-aortic aneurysm s/p graft repair in July 2409 and complicated by recurrent pseudomonal graft infection, DVT, C-diff admitted with recurrent fever  1.  Code Status: Full  2. GOC: Elijah is hard of hearing and forgot hearing aides.  I have met with his daughters several times in the past and he is well known to me.  Spoke with daughter [name] today as well. She is appropriately fearful that he is becoming resistant to abx.  She knows we may be coming to end of line as far as treatment options.  She reports that things are "in the lords hands right now". That being said, she continues to assure me that [name] wants any and all treatment options pursued as has been the case in the past. Family states they have been checking in with him about this.   Question [name] has is if he did develop recurrent infection from graft site that is resistant to suppressive regimen, would Dr's Hatcher/Campbell think there would be any other options. I suspect this will depend as well on culture results.  I will continue to follow along while he is here.  I don't suspect any goals will change until we have further information.  Reportedly he has been doing much better over the past 1-2 months at home than previous months where he was in SNF.    3. Symptom Management:   Fevers- Tylenol PRN ordered  4. Psychosocial/Spiritual: From [place]. Has 3 daughters and 1 son. Has been hospitalized or in nursing facility for past several months until going home after last discharge  Brief HPI: Patient is a 81 yo male with PMHx of thoraco-aortic aneurysm s/p graft repair in July 7353 and  complicated by recurrent pseudomonal graft infection, DVT, C-diff, FTT and aspiration s/p PEG. He was admitted on 2/15 with probable sepsis/HCAP. He is well known to me from admission in December. Since that time, he was admitted 03/23/14 with sepsis/HCAP, 1/26 with diarrhea/sepsis/?HCAP, 2/3 with sepsis. He was discharged home after last admission and arrives again with fevers at home. Reportedly had been doing well with ambulating, going to church, tolerating tube feeds and some oral liquids.  Fell on Friday but seemed to do okay over weekend when he was admitted with fevers yesterday.  Extremely hard of hearing and above information obtained from my prior knowledge of his care as well as talking with daughter today. He normally has hearing aides but left them at home. He tells me he has no complaints other than fever.       PMH:  Past Medical History  Diagnosis Date  . Hypertension   . BPH (benign prostatic hyperplasia)   . Hyperlipidemia   . AAA (abdominal aortic aneurysm) 04/2012    STENTING OF AAA IN [place]  . Junctional cardiac arrhythmia     Occurred postoperatively after urologic surgery  . Severe sinus bradycardia     Occurred postoperatively after urologic surgery  . Aortic valve disorders   . Other primary cardiomyopathies   . Dysphagia   . Status post insertion of percutaneous endoscopic gastrostomy (PEG) tube 02/11/14  . Bacteremia  due to Pseudomonas   . Infected aortic graft   . DVT (deep venous thrombosis) 04/2013    s/p IVC Filter  . Cancer 2008-2009    prostate TREATED WITH RADIATION     PSH: Past Surgical History  Procedure Laterality Date  . Abdominal surgery      PART OF COLON REMOVED FOR DIVERTICULITIS  . Appendectomy    . Hernia repair    . Bladder surgery  2008    FOR BLADDER STONE  . Total knee arthroplasty  1990    left  . Abdominal aortic aneurysm repair  04/2012  . Total knee arthroplasty Right 07/26/2012    Procedure: RIGHT TOTAL KNEE  ARTHROPLASTY;  Surgeon: Tobi Bastos, MD;  Location: [facility];  Service: Orthopedics;  Laterality: Right;  . Nephrolithotomy Left 04/26/2013    Procedure: LEFT PERCUTANEOUS NEPHROLITHOTOMY ;  Surgeon: [name], MD;  Location: [facility];  Service: Urology;  Laterality: Left;  . Nephrolithotomy Left 05/03/2013    Procedure: 2ND STAGE LEFT PERCUTANEOUS NEPHROLITHOTOMY ;  Surgeon: [name], MD;  Location: [facility];  Service: Urology;  Laterality: Left;  . Esophagogastroduodenoscopy N/A 02/11/2014    TXM:IWOEH hiatal hernia. Patchy erythema of the gastric mucosa; otherwise negative EGD. Status post 38 French Microvasive PEG tube placement  . Peg placement N/A 02/11/2014    Procedure: PERCUTANEOUS ENDOSCOPIC GASTROSTOMY (PEG) PLACEMENT;  Surgeon: Daneil Dolin, MD;  [facility];  Service: Endoscopy;  Laterality: N/A;  . Tee without cardioversion N/A 05/02/2014    Procedure: TRANSESOPHAGEAL ECHOCARDIOGRAM (TEE);  Surgeon: Lelon Perla, MD;  Location: [facility] ENDOSCOPY;  Service: Cardiovascular;  Laterality: N/A;   I have reviewed the [place] and [facility] and  If appropriate update it with new information. Allergies  Allergen Reactions  . Atorvastatin Other (See Comments)    Causes constipation  . Penicillins Other (See Comments)    Other reaction(s): RASH HIVES  Tolerates cefepime 2/22   Scheduled Meds: . antiseptic oral rinse  7 mL Mouth Rinse q12n4p  . aspirin EC  81 mg Oral Daily  . [START ON 07/17/2014] ceFEPIme (MAXIPIME) 2 GM IVP  2 g Intravenous Q24H  . chlorhexidine  15 mL Mouth Rinse BID  . dronabinol  2.5 mg Oral TID  . feeding supplement (JEVITY 1.2 CAL)  1,000 mL Per Tube Q24H  . feeding supplement (PRO-STAT SUGAR FREE 64)  30 mL Per Tube TID  . fesoterodine  4 mg Oral Daily  . free water  200 mL Per Tube QID  . heparin  5,000 Units Subcutaneous 3 times per day  . magnesium oxide  200 mg Oral Daily  . simvastatin  20 mg Oral Daily  . sodium chloride  3 mL Intravenous Q12H  .  vancomycin  125 mg Oral QID  . vancomycin  750 mg Intravenous Q12H   Continuous Infusions: . sodium chloride 75 mL/hr at 07/16/14 1218   PRN Meds:.acetaminophen, bisacodyl, cyclobenzaprine, HYDROcodone-acetaminophen    BP 105/62 mmHg  Pulse 69  Temp(Src) 97.7 F (36.5 C) (Oral)  Resp 22  Ht 6' 2"  (1.88 m)  Wt 92.534 kg (204 lb)  BMI 26.18 kg/m2  SpO2 96%   PPS: 50   Intake/Output Summary (Last 24 hours) at 07/16/14 1621 Last data filed at 07/16/14 1156  Gross per 24 hour  Intake 2370.42 ml  Output    300 ml  Net 2070.42 ml    Physical Exam:  General: Alert, NAD, very hard of hearing HEENT:  [place],  sclera anicteric Chest:   CTAB CVS: RRR Abdomen: soft, ND Ext: warm   Labs: CBC    Component Value Date/Time   WBC 9.3 07/16/2014 0329   WBC 5.8 12/26/2013 1105   WBC 5.4 02/28/2007 0919   RBC 3.71* 07/16/2014 0329   RBC 3.35* 05/01/2014 1830   RBC 4.10* 12/26/2013 1105   RBC 5.21 02/28/2007 0919   HGB 10.6* 07/16/2014 0329   HGB 11.0* 12/26/2013 1105   HGB 15.0 02/28/2007 0919   HCT 32.2* 07/16/2014 0329   HCT 34.5* 12/26/2013 1105   HCT 44.0 02/28/2007 0919   PLT 80* 07/16/2014 0329   PLT 157 12/26/2013 1105   PLT 153 02/28/2007 0919   MCV 86.8 07/16/2014 0329   MCV 84 12/26/2013 1105   MCV 84.4 02/28/2007 0919   MCH 28.6 07/16/2014 0329   MCH 26.8* 12/26/2013 1105   MCH 28.7 02/28/2007 0919   MCHC 32.9 07/16/2014 0329   MCHC 31.9* 12/26/2013 1105   MCHC 34.0 02/28/2007 0919   RDW 16.9* 07/16/2014 0329   RDW 17.5* 12/26/2013 1105   RDW 14.9* 02/28/2007 0919   LYMPHSABS 0.4* 07/15/2014 2259   LYMPHSABS 0.9 12/26/2013 1105   LYMPHSABS 1.6 02/28/2007 0919   MONOABS 0.6 07/15/2014 2259   MONOABS 0.6 02/28/2007 0919   EOSABS 0.0 07/15/2014 2259   EOSABS 0.1 12/26/2013 1105   EOSABS 0.3 02/28/2007 0919   BASOSABS 0.0 07/15/2014 2259   BASOSABS 0.0 12/26/2013 1105   BASOSABS 0.0 02/28/2007 0919    BMET    Component Value Date/Time   NA 137  07/16/2014 0329   NA 140 10/31/2013 0839   K 4.1 07/16/2014 0329   K 3.9 10/31/2013 0839   CL 106 07/16/2014 0329   CL 103 10/31/2013 0839   CO2 20* 07/16/2014 0329   CO2 27 10/31/2013 0839   GLUCOSE 118* 07/16/2014 0329   GLUCOSE 93 10/31/2013 0839   BUN 38* 07/16/2014 0329   BUN 17 10/31/2013 0839   CREATININE 1.27* 07/16/2014 0329   CREATININE 1.1 10/31/2013 0839   CALCIUM 9.1 07/16/2014 0329   CALCIUM 8.9 10/31/2013 0839   GFRNONAA 51* 07/16/2014 0329   GFRAA 59* 07/16/2014 0329    CMP     Component Value Date/Time   NA 137 07/16/2014 0329   NA 140 10/31/2013 0839   K 4.1 07/16/2014 0329   K 3.9 10/31/2013 0839   CL 106 07/16/2014 0329   CL 103 10/31/2013 0839   CO2 20* 07/16/2014 0329   CO2 27 10/31/2013 0839   GLUCOSE 118* 07/16/2014 0329   GLUCOSE 93 10/31/2013 0839   BUN 38* 07/16/2014 0329   BUN 17 10/31/2013 0839   CREATININE 1.27* 07/16/2014 0329   CREATININE 1.1 10/31/2013 0839   CALCIUM 9.1 07/16/2014 0329   CALCIUM 8.9 10/31/2013 0839   PROT 7.6 07/15/2014 2259   ALBUMIN 2.7* 07/15/2014 2259   AST 32 07/15/2014 2259   ALT 21 07/15/2014 2259   ALKPHOS 80 07/15/2014 2259   BILITOT 1.0 07/15/2014 2259   GFRNONAA 51* 07/16/2014 0329   GFRAA 59* 07/16/2014 0329   5/2 Thoracic XR IMPRESSION: Negative for acute thoracic spine fracture. Moderate kyphosis and degenerative change.  5/2 CXR IMPRESSION: No acute cardiopulmonary findings.    Total Time: 40 minutes Greater than 50%  of this time was spent counseling and coordinating care related to the above assessment and plan.  Doran Clay D.O. Palliative Medicine Team at [facility]  Pager: [phone] Team Phone: [phone]

## 2014-07-16 NOTE — Progress Notes (Addendum)
ANTIBIOTIC CONSULT NOTE - INITIAL  Pharmacy Consult for vancomycin Indication: bacteremia  Allergies  Allergen Reactions  . Atorvastatin Other (See Comments)    Causes constipation  . Penicillins Other (See Comments)    Other reaction(s): RASH HIVES  Tolerates cefepime 2/22    Patient Measurements: Height: 6\' 2"  (188 cm) Weight: 204 lb (92.534 kg) IBW/kg (Calculated) : 82.2  Vital Signs: Temp: 97.8 F (36.6 C) (05/03 0851) Temp Source: Oral (05/03 0851) BP: 119/72 mmHg (05/03 1000) Pulse Rate: 69 (05/03 1000) Intake/Output from previous day: 05/02 0701 - 05/03 0700 In: 1560.4 [I.V.:1360.4; IV Piggyback:200] Out: 200 [Urine:200] Intake/Output from this shift: Total I/O In: 810 [I.V.:500; Other:60; NG/GT:200; IV Piggyback:50] Out: -   Labs:  Recent Labs  07/15/14 2259 07/16/14 0329  WBC 8.5 9.3  HGB 12.8* 10.6*  PLT 99* 80*  CREATININE 1.29* 1.27*   Estimated Creatinine Clearance: 52.1 mL/min (by C-G formula based on Cr of 1.27). No results for input(s): VANCOTROUGH, VANCOPEAK, VANCORANDOM, GENTTROUGH, GENTPEAK, GENTRANDOM, TOBRATROUGH, TOBRAPEAK, TOBRARND, AMIKACINPEAK, AMIKACINTROU, AMIKACIN in the last 72 hours.   Microbiology: Recent Results (from the past 720 hour(s))  MRSA PCR Screening     Status: Abnormal   Collection Time: 07/16/14  4:49 AM  Result Value Ref Range Status   MRSA by PCR POSITIVE (A) NEGATIVE Final    Comment:        The GeneXpert MRSA Assay (FDA approved for NASAL specimens only), is one component of a comprehensive MRSA colonization surveillance program. It is not intended to diagnose MRSA infection nor to guide or monitor treatment for MRSA infections. RESULT CALLED TO, READ BACK BY AND VERIFIED WITH: RN,MIKKI POTTER [id number] @[id number]  THANEY     Medical History: Past Medical History  Diagnosis Date  . Hypertension   . BPH (benign prostatic hyperplasia)   . Hyperlipidemia   . AAA (abdominal aortic aneurysm) 04/2012   STENTING OF AAA IN [place]  . Junctional cardiac arrhythmia     Occurred postoperatively after urologic surgery  . Severe sinus bradycardia     Occurred postoperatively after urologic surgery  . Aortic valve disorders   . Other primary cardiomyopathies   . Dysphagia   . Status post insertion of percutaneous endoscopic gastrostomy (PEG) tube 02/11/14  . Bacteremia due to Pseudomonas   . Infected aortic graft   . DVT (deep venous thrombosis) 04/2013    s/p IVC Filter  . Cancer 2008-2009    prostate TREATED WITH RADIATION    Medications:  Prescriptions prior to admission  Medication Sig Dispense Refill Last Dose  . acetaminophen (TYLENOL) 500 MG tablet Take 1,000 mg by mouth every 6 (six) hours as needed for mild pain or moderate pain.   07/15/2014 at 600 pm  . Amino Acids-Protein Hydrolys (FEEDING SUPPLEMENT, PRO-STAT SUGAR FREE 64,) LIQD Take 30 mLs by mouth 3 (three) times daily with meals. 900 mL 0 07/15/2014 at Unknown time  . aspirin EC 81 MG tablet Take 81 mg by mouth daily.   07/15/2014 at Unknown time  . bisacodyl (DULCOLAX) 10 MG suppository Place 1 suppository (10 mg total) rectally as needed for mild constipation or moderate constipation. 12 suppository 0 unknown  . ceFEPIme 2 g in dextrose 5 % 50 mL Inject 2 g into the vein every 12 (twelve) hours. 30 each 0 07/15/2014 at 845 pm  . dronabinol (MARINOL) 2.5 MG capsule Take 1 capsule (2.5 mg total) by mouth 3 (three) times daily. 90 capsule 2 07/15/2014 at 515 pm  .  magnesium oxide (MAG-OX) 400 (241.3 MG) MG tablet Take 0.5 tablets (200 mg total) by mouth daily.   07/15/2014 at Unknown time  . Multiple Vitamin (MULTI-VITAMIN PO) Take 1 tablet by mouth daily.   07/15/2014 at Unknown time  . simvastatin (ZOCOR) 20 MG tablet Take 1 tablet (20 mg total) by mouth daily. 30 tablet 0 07/15/2014 at Unknown time  . tolterodine (DETROL LA) 4 MG 24 hr capsule Take 4 mg by mouth 2 (two) times daily.   07/15/2014 at Unknown time  . vancomycin (VANCOCIN) 50  mg/mL oral solution Take 2.5 mLs (125 mg total) by mouth 4 (four) times daily. 300 mL 5 07/15/2014 at Unknown time  . Water For Irrigation, Sterile (FREE WATER) SOLN Place 200 mLs into feeding tube 4 (four) times daily.   07/15/2014 at Unknown time   Scheduled:  . antiseptic oral rinse  7 mL Mouth Rinse q12n4p  . aspirin EC  81 mg Oral Daily  . ceFEPIme (MAXIPIME) 2 GM IVP  2 g Intravenous Q12H  . chlorhexidine  15 mL Mouth Rinse BID  . dronabinol  2.5 mg Oral TID  . feeding supplement (JEVITY 1.2 CAL)  1,000 mL Per Tube Q24H  . feeding supplement (PRO-STAT SUGAR FREE 64)  30 mL Per Tube TID  . fesoterodine  4 mg Oral Daily  . free water  200 mL Per Tube QID  . heparin  5,000 Units Subcutaneous 3 times per day  . magnesium oxide  200 mg Oral Daily  . simvastatin  20 mg Oral Daily  . sodium chloride  3 mL Intravenous Q12H  . vancomycin  125 mg Oral QID    Assessment: 81 y.o. 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 Diff). Per ID note plans for cefepime and vancomycin po indefinitely. Pharmacy has been consulted to begin vancomycin. WBC= 9.3, afeb, SCr= 1.27, CrCl ~ 50.   -Of note SCr was 0.78 on 05/04/14 and 1.02 on 05/08/14. SCr is now up.   Vancomycin 5/3>> Cefepime (on PTA; 05/07/14)>>  Goal of Therapy:  Vancomycin trough level 15-20 mcg/ml   Plan:  -Vancomycin 1500mg  IV x1 followed by 750mg  IV q12h -Will change cefepime to 2gm IV q24hr -Will follow renal function, cultures and clinical progress  Hildred Laser, Pharm D 07/16/2014 11:11 AM

## 2014-07-16 NOTE — H&P (Signed)
[facility] History and Physical  [name] GYI:[id number] DOB: 1929/04/19 DOA: 07/15/2014  Referring physician: EDP PCP: [name], MD   Chief Complaint: Fever   HPI: [name] is a 81 y.o. 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  Patient presents to ED after developing fever at home today.  Multiple PO temperature readings of over 99 felt to be inaccurate so they took temperature under arm, under arm reading at home of 106.7.  Patient has no other complaints other than mid back pain following a fall last week which has persisted.  Review of Systems: Systems reviewed.  As above, otherwise negative  Past Medical History  Diagnosis Date  . Hypertension   . BPH (benign prostatic hyperplasia)   . Hyperlipidemia   . AAA (abdominal aortic aneurysm) 04/2012    STENTING OF AAA IN [place]  . Junctional cardiac arrhythmia     Occurred postoperatively after urologic surgery  . Severe sinus bradycardia     Occurred postoperatively after urologic surgery  . Aortic valve disorders   . Other primary cardiomyopathies   . Dysphagia   . Status post insertion of percutaneous endoscopic gastrostomy (PEG) tube 02/11/14  . Bacteremia due to Pseudomonas   . Infected aortic graft   . DVT (deep venous thrombosis) 04/2013    s/p IVC Filter  . Cancer 2008-2009    prostate TREATED WITH RADIATION   Past Surgical History  Procedure Laterality Date  . Abdominal surgery      PART OF COLON REMOVED FOR DIVERTICULITIS  . Appendectomy    . Hernia repair    . Bladder surgery  2008    FOR BLADDER STONE  . Total knee arthroplasty  1990    left  . Abdominal aortic aneurysm repair  04/2012  . Total knee arthroplasty Right 07/26/2012    Procedure: RIGHT TOTAL KNEE ARTHROPLASTY;  Surgeon: Tobi Bastos, MD;  Location: [facility];  Service: Orthopedics;  Laterality: Right;  .  Nephrolithotomy Left 04/26/2013    Procedure: LEFT PERCUTANEOUS NEPHROLITHOTOMY ;  Surgeon: [name], MD;  Location: [facility];  Service: Urology;  Laterality: Left;  . Nephrolithotomy Left 05/03/2013    Procedure: 2ND STAGE LEFT PERCUTANEOUS NEPHROLITHOTOMY ;  Surgeon: [name], MD;  Location: [facility];  Service: Urology;  Laterality: Left;  . Esophagogastroduodenoscopy N/A 02/11/2014    [id number] hiatal hernia. Patchy erythema of the gastric mucosa; otherwise negative EGD. Status post 54 French Microvasive PEG tube placement  . Peg placement N/A 02/11/2014    Procedure: PERCUTANEOUS ENDOSCOPIC GASTROSTOMY (PEG) PLACEMENT;  Surgeon: Daneil Dolin, MD;  Location: [facility];  Service: Endoscopy;  Laterality: N/A;  . Tee without cardioversion N/A 05/02/2014    Procedure: TRANSESOPHAGEAL ECHOCARDIOGRAM (TEE);  Surgeon: Lelon Perla, MD;  Location: [facility];  Service: Cardiovascular;  Laterality: N/A;   Social History:  reports that he quit smoking about 29 years ago. His smoking use included Cigarettes. He started smoking about 65 years ago. He has a 74 pack-year smoking history. He has quit using smokeless tobacco. He reports that he does not drink alcohol or use illicit drugs.  Allergies  Allergen Reactions  . Atorvastatin Other (See Comments)    Causes constipation  . Penicillins Other (See Comments)    Other reaction(s): RASH HIVES  Tolerates cefepime 2/22    Family History  Problem Relation Age of Onset  .  Lung cancer Father      Prior to Admission medications   Medication Sig Start Date End Date Taking? Authorizing Provider  acetaminophen (TYLENOL) 500 MG tablet Take 1,000 mg by mouth every 6 (six) hours as needed for mild pain or moderate pain.   Yes Historical Provider, MD  Amino Acids-Protein Hydrolys (FEEDING SUPPLEMENT, PRO-STAT SUGAR FREE 64,) LIQD Take 30 mLs by mouth 3 (three) times daily with meals. 05/07/14  Yes Albertine Patricia, MD  aspirin EC 81 MG tablet Take  81 mg by mouth daily.   Yes Historical Provider, MD  bisacodyl (DULCOLAX) 10 MG suppository Place 1 suppository (10 mg total) rectally as needed for mild constipation or moderate constipation. 05/07/14  Yes Silver Huguenin Elgergawy, MD  ceFEPIme 2 g in dextrose 5 % 50 mL Inject 2 g into the vein every 12 (twelve) hours. 05/07/14  Yes Albertine Patricia, MD  dronabinol (MARINOL) 2.5 MG capsule Take 1 capsule (2.5 mg total) by mouth 3 (three) times daily. 06/07/14  Yes [name], MD  magnesium oxide (MAG-OX) 400 (241.3 MG) MG tablet Take 0.5 tablets (200 mg total) by mouth daily. 04/23/14  Yes Geradine Girt, DO  Multiple Vitamin (MULTI-VITAMIN PO) Take 1 tablet by mouth daily.   Yes Historical Provider, MD  simvastatin (ZOCOR) 20 MG tablet Take 1 tablet (20 mg total) by mouth daily. 05/07/14  Yes Albertine Patricia, MD  tolterodine (DETROL LA) 4 MG 24 hr capsule Take 4 mg by mouth 2 (two) times daily.   Yes Historical Provider, MD  vancomycin (VANCOCIN) 50 mg/mL oral solution Take 2.5 mLs (125 mg total) by mouth 4 (four) times daily. 06/03/14  Yes Michel Bickers, MD  Water For Irrigation, Sterile (FREE WATER) SOLN Place 200 mLs into feeding tube 4 (four) times daily. 05/07/14  Yes Albertine Patricia, MD   Physical Exam: Filed Vitals:   07/16/14 0200  BP: 104/67  Pulse: 86  Temp:   Resp: 25    BP 104/67 mmHg  Pulse 86  Temp(Src) 98.1 F (36.7 C) (Oral)  Resp 25  Ht 6\' 2"  (1.88 m)  Wt 94.348 kg (208 lb)  BMI 26.69 kg/m2  SpO2 92%  General Appearance:    Alert, oriented, no distress, appears stated age  Head:    Normocephalic, atraumatic  Eyes:    PERRL, EOMI, sclera non-icteric        Nose:   Nares without drainage or epistaxis. Mucosa, turbinates normal  Throat:   Moist mucous membranes. Oropharynx without erythema or exudate.  Neck:   Supple. No carotid bruits.  No thyromegaly.  No lymphadenopathy.   Back:     No CVA tenderness, no spinal tenderness  Lungs:     Clear to auscultation  bilaterally, without wheezes, rhonchi or rales  Chest wall:    No tenderness to palpitation  Heart:    Regular rate and rhythm without murmurs, gallops, rubs  Abdomen:     Soft, non-tender, nondistended, normal bowel sounds, no organomegaly  Genitalia:    deferred  Rectal:    deferred  Extremities:   No clubbing, cyanosis or edema.  Pulses:   2+ and symmetric all extremities  Skin:   Skin color, texture, turgor normal, no rashes or lesions  Lymph nodes:   Cervical, supraclavicular, and axillary nodes normal  Neurologic:   CNII-XII intact. Normal strength, sensation and reflexes      throughout    Labs on Admission:  Basic Metabolic Panel:  Recent  Labs Lab 07/15/14 2259  NA 137  K 3.6  CL 105  CO2 21*  GLUCOSE 128*  BUN 39*  CREATININE 1.29*  CALCIUM 9.8   Liver Function Tests:  Recent Labs Lab 07/15/14 2259  AST 32  ALT 21  ALKPHOS 80  BILITOT 1.0  PROT 7.6  ALBUMIN 2.7*   No results for input(s): LIPASE, AMYLASE in the last 168 hours. No results for input(s): AMMONIA in the last 168 hours. CBC:  Recent Labs Lab 07/15/14 2259  WBC 8.5  NEUTROABS 7.5  HGB 12.8*  HCT 39.6  MCV 88.2  PLT 99*   Cardiac Enzymes: No results for input(s): CKTOTAL, CKMB, CKMBINDEX, TROPONINI in the last 168 hours.  BNP (last 3 results) No results for input(s): PROBNP in the last 8760 hours. CBG: No results for input(s): GLUCAP in the last 168 hours.  Radiological Exams on Admission: Dg Chest 1 View  07/16/2014   CLINICAL DATA:  Upper to mid back pain after fall on Friday.  EXAM: CHEST  1 VIEW  COMPARISON:  05/08/2014  FINDINGS: There is an endoluminal descending thoracic aortic stent graft. There are unchanged moderately prominent aortic contours. There is a left upper extremity PICC line extending into the SVC. There is unchanged left hemidiaphragm elevation. There is mild interstitial coarsening and mild linear left base scarring, unchanged.  IMPRESSION: No acute  cardiopulmonary findings.   Electronically Signed   By: Andreas Newport M.D.   On: 07/16/2014 00:27   Dg Thoracic Spine W/swimmers  07/16/2014   CLINICAL DATA:  Upper mid back pain after fall on Friday  EXAM: THORACIC SPINE - 2 VIEW + SWIMMERS  COMPARISON:  None.  FINDINGS: There is no evidence of acute thoracic spine fracture. There is moderate kyphosis. There is moderately severe degenerative disc change at multiple levels. There is no bone lesion or bony destruction. The endoluminal aortic stent graft is again noted, without change and aortic contour.  IMPRESSION: Negative for acute thoracic spine fracture. Moderate kyphosis and degenerative change.   Electronically Signed   By: Andreas Newport M.D.   On: 07/16/2014 00:28    EKG: Independently reviewed.  Assessment/Plan Active Problems:   Sepsis   Bacterial infection due to Proteus mirabilis   Hx of Clostridium difficile infection   S/P AAA repair 2014 with chronic graft infection   Bacteremia due to Gram-negative bacteria   1. 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 Continue cefepime 2. IVF 3. Tele monitor 4. ID consulted: 1. Continue cefepime for now 2. Continue to use PICC 3. Await cultures (BCX have been sent) 4. No further imaging needed tonight (re: back pain). 2. H/o C.Diff - continue home PO vanc    Code Status: Full - family does not seem ready to consider palliative care at this point Family Communication: Family at bedside Disposition Plan: Admit to inpatient   Time spent: 70 min  GARDNER, JARED M. [facility] Pager [phone]  If 7AM-7PM, please contact the day team taking care of the patient [email or web address] Password [email or web address] 07/16/2014, 2:18 AM

## 2014-07-16 NOTE — Progress Notes (Signed)
Stoke with patients daughter over the phone . She said that patient does not eat food at home and only drinks gatorade, water and takes pills by mouth. She says he gets tube feedings at night time per his g-tube.  He does not have an appetite and refused to try any of his breakfast. Dr. Algis Liming paged and made aware of situation and a dietician consult has been ordered. Will continue to monitor.  Sandre Kitty

## 2014-07-16 NOTE — Consult Note (Signed)
[facility] for Infectious Disease  Date of Admission:  07/15/2014  Date of Consult:  07/16/2014  Reason for Consult:Fever Referring Physician: Hongalgi  Impression/Recommendation Fever AAA infection (pseudomonas, proteus)  Would Add IV vanco Await BCx If further fever, consider changing cefepime to merrem.   Comment- His PIC does no appear infected (non-tender, no erythema). 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  Would be worthwhile to ask palliative care to see him again to define goals of care. A CT of the abd would be indicated if he were a surgical candidate? He is not a surgical candidate however and the only utility of this might be for aspiration or for prognosis of his graft?  Thank you so much for this interesting consult,   Bobby Rumpf (pager) [phone] [email or web address]  [name] is an 81 y.o. male.  HPI: 81 year old male with a history of a thoraco-abdominal aortic aneurysm in July 7001 complicated by Pseudomonas (sensitive to ceftaz, Zosyn, gent, Levaquin) graft infection and subsequent pseudomonas (12-16-13, pan-sens). He was started on cefepime at that time and was discharged to skilled nursing facility with plan for ongoing IV antibiotics. He has been followed by [facility] infectious disease. His PICC line was removed 12-9 and his antibiotics were changed to by mouth Levaquin by [facility] ID.  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  His BCx grew proteus (R- bactrim, cipro, gent. I - tobra). He was treated with merrem/rifampin and then was supposed to f/u at [facility] for his anbx for his anbx for his graft. He and his family instead chose to f/u at [facility]. By his MAR he is on levaquin/rifampin. By his d/c notes he completed his merrem on 03-07-14.  He had C diff+ 02-26-14, treated with vanco 182m qid.  He was hospitalized  04-09-14 to 04-23-14 with septic shock (BCx -). He was d/c home with high dose cipro.  He retuned to ED 2-15 with f/c, n/v. He was again bacteremic with P mirabilis (R- cipro). He was d/c back to SNF on 2-23 on IV cefepime. He had f/u in ID clinic on 3-8 and was planned to stay on IV cefepime for at least 4 more weeks. He returned to ID clinic on 4-5 and was doing well with the exception of having had a C diff recurrence. He was maintained on po vanco and IV cefepime with plan to remain on these indefinitely.   He returns on 5-3 with temp to 106.7 at his SNF. His WBC is normal, his CXR is unrevealing. BCx, UCx pending. He is fatigued- denies worsened/diarrhea. No cough or sob, no dysuria, no problems with PIC.    Past Medical History  Diagnosis Date  . Hypertension   . BPH (benign prostatic hyperplasia)   . Hyperlipidemia   . AAA (abdominal aortic aneurysm) 04/2012    STENTING OF AAA IN [place]  . Junctional cardiac arrhythmia     Occurred postoperatively after urologic surgery  . Severe sinus bradycardia     Occurred postoperatively after urologic surgery  . Aortic valve disorders   . Other primary cardiomyopathies   . Dysphagia   . Status post insertion of percutaneous endoscopic gastrostomy (PEG) tube 02/11/14  . Bacteremia due to Pseudomonas   . Infected aortic graft   . DVT (deep venous thrombosis) 04/2013    s/p IVC  Filter  . Cancer 2008-2009    prostate TREATED WITH RADIATION    Past Surgical History  Procedure Laterality Date  . Abdominal surgery      PART OF COLON REMOVED FOR DIVERTICULITIS  . Appendectomy    . Hernia repair    . Bladder surgery  2008    FOR BLADDER STONE  . Total knee arthroplasty  1990    left  . Abdominal aortic aneurysm repair  04/2012  . Total knee arthroplasty Right 07/26/2012    Procedure: RIGHT TOTAL KNEE ARTHROPLASTY;  Surgeon: Tobi Bastos, MD;  Location: [facility];  Service: Orthopedics;  Laterality: Right;  . Nephrolithotomy Left 04/26/2013     Procedure: LEFT PERCUTANEOUS NEPHROLITHOTOMY ;  Surgeon: [name], MD;  Location: [facility] OR[facility];  Service: Urology;  Laterality: Left;  . Nephrolithotomy Left 05/03/2013    Procedure: 2ND STAGE LEFT PERCUTANEOUS NEPHROLITHOTOMY ;  Surgeon: [name], MD;  Location: [facility];  Service: Urology;  Laterality: Left;  . Esophagogastroduodenoscopy N/A 02/11/2014    QQI:[facility] hiatal hernia. Patchy erythema of the gastric mucosa; otherwise negative EGD. Status post 75 French Microvasive PEG tube placement  . Peg placement N/A 02/11/2014    Procedure: PERCUTANEOUS ENDOSCOPIC GASTROSTOMY (PEG) PLACEMENT;  Surgeon: Daneil Dolin, MD;  Location: [facility];  Service: Endoscopy;  Laterality: N/A;  . Tee without cardioversion N/A 05/02/2014    Procedure: TRANSESOPHAGEAL ECHOCARDIOGRAM (TEE);  Surgeon: Lelon Perla, MD;  Location: [facility] ENDOSCOPY;  Service: Cardiovascular;  Laterality: N/A;     Allergies  Allergen Reactions  . Atorvastatin Other (See Comments)    Causes constipation  . Penicillins Other (See Comments)    Other reaction(s): RASH HIVES  Tolerates cefepime 2/22    Medications:  Scheduled: . antiseptic oral rinse  7 mL Mouth Rinse q12n4p  . aspirin EC  81 mg Oral Daily  . ceFEPIme (MAXIPIME) 2 GM IVP  2 g Intravenous Q12H  . chlorhexidine  15 mL Mouth Rinse BID  . dronabinol  2.5 mg Oral TID  . feeding supplement (PRO-STAT SUGAR FREE 64)  30 mL Oral TID WC  . fesoterodine  4 mg Oral Daily  . free water  200 mL Per Tube QID  . heparin  5,000 Units Subcutaneous 3 times per day  . magnesium oxide  200 mg Oral Daily  . simvastatin  20 mg Oral Daily  . sodium chloride  3 mL Intravenous Q12H  . vancomycin  125 mg Oral QID    Abtx:  Anti-infectives    Start     Dose/Rate Route Frequency Ordered Stop   07/16/14 1000  vancomycin (VANCOCIN) 50 mg/mL oral solution 125 mg     125 mg Oral 4 times daily 07/16/14 0150     07/16/14 0230  ceFEPIme (MAXIPIME) 2 g in dextrose 5 % 50 mL IVPB       2 g 100 mL/hr over 30 Minutes Intravenous Every 12 hours 07/16/14 0217     07/15/14 2330  levofloxacin (LEVAQUIN) IVPB 750 mg     750 mg 100 mL/hr over 90 Minutes Intravenous  Once 07/15/14 2321 07/16/14 0118      Total days of antibiotics: July 2015 05-07-14 cefepime          Social History:  reports that he quit smoking about 29 years ago. His smoking use included Cigarettes. He started smoking about 65 years ago. He has a 74 pack-year smoking history. He has quit using smokeless tobacco. He reports that he  does not drink alcohol or use illicit drugs.  Family History  Problem Relation Age of Onset  . Lung cancer Father     General ROS: + fever, no dysuria, no problems/pain at [facility] site, no loose BM, no cough, feet are cold, not painful. see HPI.   Blood pressure 120/66, pulse 70, temperature 97.8 F (36.6 C), temperature source Oral, resp. rate 29, height 6' 2"  (1.88 m), weight 92.534 kg (204 lb), SpO2 97 %. General appearance: fatigued and no distress Eyes: negative findings: conjunctivae and sclerae normal and pupils equal, round, reactive to light and accomodation Throat: normal findings: no thrush and abnormal findings: dry Neck: no adenopathy and supple, symmetrical, trachea midline Lungs: clear to auscultation bilaterally Heart: regular rate and rhythm Abdomen: normal findings: bowel sounds normal and soft, non-tender Extremities: edema trace. there is normal warmth, there is no evidence of ischemia.    Results for orders placed or performed during the hospital encounter of 07/15/14 (from the past 48 hour(s))  CBC WITH DIFFERENTIAL     Status: Abnormal   Collection Time: 07/15/14 10:59 PM  Result Value Ref Range   WBC 8.5 4.0 - 10.5 K/uL   RBC 4.49 4.22 - 5.81 MIL/uL   Hemoglobin 12.8 (L) 13.0 - 17.0 g/dL   HCT 39.6 39.0 - 52.0 %   MCV 88.2 78.0 - 100.0 fL   MCH 28.5 26.0 - 34.0 pg   MCHC 32.3 30.0 - 36.0 g/dL   RDW 16.8 (H) 11.5 - 15.5 %   Platelets 99 (L) 150 -  400 K/uL    Comment: SPECIMEN CHECKED FOR CLOTS REPEATED TO VERIFY PLATELET COUNT CONFIRMED BY SMEAR    Neutrophils Relative % 88 (H) 43 - 77 %   Neutro Abs 7.5 1.7 - 7.7 K/uL   Lymphocytes Relative 5 (L) 12 - 46 %   Lymphs Abs 0.4 (L) 0.7 - 4.0 K/uL   Monocytes Relative 7 3 - 12 %   Monocytes Absolute 0.6 0.1 - 1.0 K/uL   Eosinophils Relative 0 0 - 5 %   Eosinophils Absolute 0.0 0.0 - 0.7 K/uL   Basophils Relative 0 0 - 1 %   Basophils Absolute 0.0 0.0 - 0.1 K/uL  Comprehensive metabolic panel     Status: Abnormal   Collection Time: 07/15/14 10:59 PM  Result Value Ref Range   Sodium 137 135 - 145 mmol/L   Potassium 3.6 3.5 - 5.1 mmol/L   Chloride 105 101 - 111 mmol/L   CO2 21 (L) 22 - 32 mmol/L   Glucose, Bld 128 (H) 70 - 99 mg/dL   BUN 39 (H) 6 - 20 mg/dL   Creatinine, Ser 1.29 (H) 0.61 - 1.24 mg/dL   Calcium 9.8 8.9 - 10.3 mg/dL   Total Protein 7.6 6.5 - 8.1 g/dL   Albumin 2.7 (L) 3.5 - 5.0 g/dL   AST 32 15 - 41 U/L   ALT 21 17 - 63 U/L   Alkaline Phosphatase 80 38 - 126 U/L   Total Bilirubin 1.0 0.3 - 1.2 mg/dL   GFR calc non Af Amer 50 (L) >60 mL/min   GFR calc Af Amer 58 (L) >60 mL/min    Comment: (NOTE) The eGFR has been calculated using the CKD EPI equation. This calculation has not been validated in all clinical situations. eGFR's persistently <90 mL/min signify possible Chronic Kidney Disease.    Anion gap 11 5 - 15  I-Stat CG4 Lactic Acid, ED  (not at [facility])  Status: None   Collection Time: 07/15/14 11:08 PM  Result Value Ref Range   Lactic Acid, Venous 1.73 0.5 - 2.0 mmol/L  CBC     Status: Abnormal   Collection Time: 07/16/14  3:29 AM  Result Value Ref Range   WBC 9.3 4.0 - 10.5 K/uL   RBC 3.71 (L) 4.22 - 5.81 MIL/uL   Hemoglobin 10.6 (L) 13.0 - 17.0 g/dL   HCT 32.2 (L) 39.0 - 52.0 %   MCV 86.8 78.0 - 100.0 fL   MCH 28.6 26.0 - 34.0 pg   MCHC 32.9 30.0 - 36.0 g/dL   RDW 16.9 (H) 11.5 - 15.5 %   Platelets 80 (L) 150 - 400 K/uL    Comment:  REPEATED TO VERIFY CONSISTENT WITH PREVIOUS RESULT   Basic metabolic panel     Status: Abnormal   Collection Time: 07/16/14  3:29 AM  Result Value Ref Range   Sodium 137 135 - 145 mmol/L   Potassium 4.1 3.5 - 5.1 mmol/L   Chloride 106 101 - 111 mmol/L   CO2 20 (L) 22 - 32 mmol/L   Glucose, Bld 118 (H) 70 - 99 mg/dL   BUN 38 (H) 6 - 20 mg/dL   Creatinine, Ser 1.27 (H) 0.61 - 1.24 mg/dL   Calcium 9.1 8.9 - 10.3 mg/dL   GFR calc non Af Amer 51 (L) >60 mL/min   GFR calc Af Amer 59 (L) >60 mL/min    Comment: (NOTE) The eGFR has been calculated using the CKD EPI equation. This calculation has not been validated in all clinical situations. eGFR's persistently <90 mL/min signify possible Chronic Kidney Disease.    Anion gap 11 5 - 15  MRSA PCR Screening     Status: Abnormal   Collection Time: 07/16/14  4:49 AM  Result Value Ref Range   MRSA by PCR POSITIVE (A) NEGATIVE    Comment:        The GeneXpert MRSA Assay (FDA approved for NASAL specimens only), is one component of a comprehensive MRSA colonization surveillance program. It is not intended to diagnose MRSA infection nor to guide or monitor treatment for MRSA infections. RESULT CALLED TO, READ BACK BY AND VERIFIED WITH: RN,MIKKI POTTER [id number] $[email or web address]'@0725'[id number]$  THANEY   Urinalysis with microscopic     Status: Abnormal   Collection Time: 07/16/14  6:48 AM  Result Value Ref Range   Color, Urine YELLOW YELLOW   APPearance CLEAR CLEAR   Specific Gravity, Urine 1.022 1.005 - 1.030   pH 6.0 5.0 - 8.0   Glucose, UA NEGATIVE NEGATIVE mg/dL   Hgb urine dipstick MODERATE (A) NEGATIVE   Bilirubin Urine NEGATIVE NEGATIVE   Ketones, ur 15 (A) NEGATIVE mg/dL   Protein, ur 100 (A) NEGATIVE mg/dL   Urobilinogen, UA 0.2 0.0 - 1.0 mg/dL   Nitrite NEGATIVE NEGATIVE   Leukocytes, UA NEGATIVE NEGATIVE  Urine microscopic-add on     Status: Abnormal   Collection Time: 07/16/14  6:48 AM  Result Value Ref Range   Squamous Epithelial / LPF FEW (A)  RARE   WBC, UA 3-6 <3 WBC/hpf   RBC / HPF 0-2 <3 RBC/hpf   Bacteria, UA FEW (A) RARE   Casts GRANULAR CAST (A) NEGATIVE      Component Value Date/Time   SDES BLOOD RIGHT ARM 05/08/2014 2310   SPECREQUEST BOTTLES DRAWN AEROBIC AND ANAEROBIC 10CC EACH 05/08/2014 2310   CULT  05/08/2014 2310    NO GROWTH 5 DAYS Performed at [facility]    REPTSTATUS 05/15/2014 FINAL 05/08/2014 2310   Dg Chest 1 View  07/16/2014   CLINICAL DATA:  Upper to mid back pain after fall on Friday.  EXAM: CHEST  1 VIEW  COMPARISON:  05/08/2014  FINDINGS: There is an endoluminal descending thoracic aortic stent graft. There are unchanged moderately prominent aortic contours. There is a left upper extremity PICC line extending into the SVC. There is unchanged left hemidiaphragm elevation. There is mild interstitial coarsening and mild linear left base scarring, unchanged.  IMPRESSION: No acute cardiopulmonary findings.   Electronically Signed   By: Andreas Newport M.D.   On: 07/16/2014 00:27   Dg Thoracic Spine W/swimmers  07/16/2014   CLINICAL DATA:  Upper mid back pain after fall on Friday  EXAM: THORACIC SPINE - 2 VIEW + SWIMMERS  COMPARISON:  None.  FINDINGS: There is no evidence of acute thoracic spine fracture. There is moderate kyphosis. There is moderately severe degenerative disc change at multiple levels. There is no bone lesion or bony destruction. The endoluminal aortic stent graft is again noted, without change and aortic contour.  IMPRESSION: Negative for acute thoracic spine fracture. Moderate kyphosis and degenerative change.   Electronically Signed   By: Andreas Newport M.D.   On: 07/16/2014 00:28   Recent Results (from the past 240 hour(s))  MRSA PCR Screening     Status: Abnormal   Collection Time: 07/16/14  4:49 AM  Result Value Ref Range Status   MRSA by PCR POSITIVE (A) NEGATIVE Final    Comment:        The GeneXpert MRSA Assay (FDA approved for NASAL specimens only), is one component  of a comprehensive MRSA colonization surveillance program. It is not intended to diagnose MRSA infection nor to guide or monitor treatment for MRSA infections. RESULT CALLED TO, READ BACK BY AND VERIFIED WITH: RN,MIKKI POTTER [id number] @0725  THANEY       07/16/2014, 10:14 AM     LOS: 0 days

## 2014-07-16 NOTE — ED Notes (Signed)
X ray reports unable to get 2 view CXR---only got 1 view

## 2014-07-16 NOTE — Progress Notes (Addendum)
PROGRESS NOTE    [name] RFF:[id number] DOB: [birth year] DOA: 07/15/2014 PCP: [name], MD  HPI/Brief narrative 81 y.o. 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 difficile colitis controlled on oral vancomycin, presented to ED with complaints of fever ( Multiple PO temperature readings of over 99 felt to be inaccurate so they took temperature under arm, under arm reading at home of 101.7). Patient has no other complaints other than mid back pain following a fall last week which has persisted. Other PMH: HTN, HLD, DVT s/p IVC filter, G-tube feeds. Admitted for infection of aortic graft. Infectious disease was consulted. Met sepsis criteria on arrival (blood pressure 87/73, pulse 125, RR 38) and potential source.  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 difficile colitis - Continue PO vancomycin indefinitely  Essential hypertension - Controlled off medications. Monitor  Hyperlipidemia - Continue statins  PEG tube feeding - As per family, patient only drinks Gatorade, water and takes pills by mouth and gets tube feeding at night via G-tube - Dietitian consulted to resume tube feeds.  Acute kidney injury - May be related to ongoing  infection - Brief IV fluids and follow BMP  Chronic anemia - Hemoglobin probably at baseline. Follow CBC  Thrombocytopenia - Chronic and intermittent - Now possibly related to infection - Follow CBC  Hard of hearing  History of DVT, s/p IVC filter  Low back pain - No acute findings on physical exam - X-rays without fracture    Code Status: Full Family Communication: None at bedside Disposition Plan: Home when medically stable. Stable to transfer to medical bed.    Consultants:  ID  Procedures:  Has chronic left upper extremity PICC line  Has chronic PEG tube  Antibiotics:  IV Cefepime 5/3>  PO Vancomycin 5/3>   Subjective: Complains of mild intermittent low back pain. Denies any other complaints. As per nursing, no acute events.  Objective: Filed Vitals:   07/16/14 0500 07/16/14 0851 07/16/14 0900 07/16/14 1000  BP: 116/68 120/66 127/66 119/72  Pulse: 78 70 68 69  Temp:  97.8 F (36.6 C)    TempSrc:  Oral    Resp: _0 Height:      Weight:      SpO2: 95% 97% 100% 96%    Intake/Output Summary (Last 24 hours) at 07/16/14 1040 Last data filed at 07/16/14 1000  Gross per 24 hour  Intake 2370.42 ml  Output    200 ml  Net 2170.42 ml   Filed Weights   07/15/14 2248 07/16/14 0230  Weight: 94.348 kg (208 lb) 92.534 kg (204 lb)     Exam:  General exam: Pleasant elderly male, quite hard of hearing, lying comfortably supine in bed. Respiratory system: Clear. No increased work of breathing. Cardiovascular system: S1 & S2 heard, RRR. No JVD, murmurs, gallops,  clicks or pedal edema. Telemetry: Sinus rhythm. Gastrointestinal system: Abdomen is nondistended, soft and nontender. Normal bowel sounds heard. PEG tube site intact Central nervous system: Alert and oriented. No focal neurological deficits. Extremities: Symmetric 5 x 5 power. Left upper extremity PICC site without acute infection Musculoskeletal system: No acute findings on back  exam   Data Reviewed: Basic Metabolic Panel:  Recent Labs Lab 07/15/14 2259 07/16/14 0329  NA 137 137  K 3.6 4.1  CL 105 106  CO2 21* 20*  GLUCOSE 128* 118*  BUN 39* 38*  CREATININE 1.29* 1.27*  CALCIUM 9.8 9.1   Liver Function Tests:  Recent Labs Lab 07/15/14 2259  AST 32  ALT 21  ALKPHOS 80  BILITOT 1.0  PROT 7.6  ALBUMIN 2.7*   No results for input(s): LIPASE, AMYLASE in the last 168 hours. No results for input(s): AMMONIA in the last 168 hours. CBC:  Recent Labs Lab 07/15/14 2259 07/16/14 0329  WBC 8.5 9.3  NEUTROABS 7.5  --   HGB 12.8* 10.6*  HCT 39.6 32.2*  MCV 88.2 86.8  PLT 99* 80*   Cardiac Enzymes: No results for input(s): CKTOTAL, CKMB, CKMBINDEX, TROPONINI in the last 168 hours. BNP (last 3 results) No results for input(s): PROBNP in the last 8760 hours. CBG: No results for input(s): GLUCAP in the last 168 hours.  Recent Results (from the past 240 hour(s))  MRSA PCR Screening     Status: Abnormal   Collection Time: 07/16/14  4:49 AM  Result Value Ref Range Status   MRSA by PCR POSITIVE (A) NEGATIVE Final    Comment:        The GeneXpert MRSA Assay (FDA approved for NASAL specimens only), is one component of a comprehensive MRSA colonization surveillance program. It is not intended to diagnose MRSA infection nor to guide or monitor treatment for MRSA infections. RESULT CALLED TO, READ BACK BY AND VERIFIED WITH: RN,MIKKI POTTER [id number] _0  THANEY           Studies: Dg Chest 1 View  07/16/2014   CLINICAL DATA:  Upper to mid back pain after fall on Friday.  EXAM: CHEST  1 VIEW  COMPARISON:  05/08/2014  FINDINGS: There is an endoluminal descending thoracic aortic stent graft. There are unchanged moderately prominent aortic contours. There is a left upper extremity PICC line extending into the SVC. There is unchanged left hemidiaphragm elevation. There is mild interstitial coarsening and mild linear left base scarring, unchanged.   IMPRESSION: No acute cardiopulmonary findings.   Electronically Signed   By: Andreas Newport M.D.   On: 07/16/2014 00:27   Dg Thoracic Spine W/swimmers  07/16/2014   CLINICAL DATA:  Upper mid back pain after fall on Friday  EXAM: THORACIC SPINE - 2 VIEW + SWIMMERS  COMPARISON:  None.  FINDINGS: There is no evidence of acute thoracic spine fracture. There is moderate kyphosis. There is moderately severe degenerative disc change at multiple levels. There is no bone lesion or bony destruction. The endoluminal aortic stent graft is again noted, without change and aortic contour.  IMPRESSION: Negative for acute thoracic spine fracture. Moderate kyphosis and degenerative change.   Electronically Signed   By: Andreas Newport M.D.   On: 07/16/2014 00:28        Scheduled Meds: . antiseptic oral rinse  7 mL Mouth Rinse q12n4p  . aspirin EC  81 mg Oral Daily  . ceFEPIme (MAXIPIME) 2 GM IVP  2 g Intravenous Q12H  . chlorhexidine  15 mL Mouth Rinse BID  . dronabinol  2.5 mg Oral TID  . feeding supplement (PRO-STAT SUGAR FREE 64)  30 mL Oral TID WC  . fesoterodine  4 mg Oral Daily  . free water  200 mL Per Tube QID  . heparin  5,000 Units Subcutaneous 3 times per day  . magnesium oxide  200 mg Oral Daily  . simvastatin  20 mg Oral Daily  . sodium chloride  3 mL Intravenous Q12H  . vancomycin  125 mg Oral QID   Continuous Infusions: . sodium chloride 125 mL/hr at 07/16/14 0800    Active Problems:   Sepsis   Bacterial infection due to Proteus mirabilis   Hx of Clostridium difficile infection   S/P AAA repair 2014 with chronic graft infection   Bacteremia due to Gram-negative bacteria    Time spent: 69 minutes    Tyrihanna Wingert, MD, FACP, FHM. [facility] Pager [phone]  If 7PM-7AM, please contact night-coverage [email or web address] Password [email or web address] 07/16/2014, 10:40 AM    LOS: 0 days

## 2014-07-16 NOTE — Progress Notes (Addendum)
Initial Nutrition Assessment  DOCUMENTATION CODES:  Non-severe (moderate) malnutrition in context of chronic illness  INTERVENTION:   Downgrade diet to Dysphagia 3, thin liquids   Tube feeding (Resume nocturnal TF regimen at 6 PM today -- infuse Jevity 1.2 formula at 100 ml/hr x 12 hours (from 6 PM to 6 AM) )   Continue Prostat liquid protein 30 ml TID via tube  Total TF regimen to provide 1740 kcals, 112 gm protein, 968 ml of free water  NUTRITION DIAGNOSIS:  Inadequate oral intake related to dysphagia (poor appetite) as evidenced by per patient/family report.  GOAL:  Patient will meet greater than or equal to 90% of their needs  MONITOR:  TF tolerance, PO intake, Weight trends, Labs, I & O's  REASON FOR ASSESSMENT:  Consult Enteral/tube feeding initiation and management  ASSESSMENT: 81 y.o. 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 Patient presents to ED after developing fever at home today. Multiple PO temperature readings of over 99 felt to be inaccurate so they took temperature under arm, under arm reading at home of 106.7.  RD spoke with patient's daughter, [name] via telephone.  Pt takes very little by mouth.  Appetite is poor and pt not interested in eating.  Receives nocturnal TF regimen via PEG tube of Jevity 1.2 formula x 12 hours with Prostat liquid protein 30 ml TID.  [name] also reports pt's weight has been stable.  UBW is ~ 205 to 209 lbs.  Malnutrition identified during previous hospital admission which is ongoing.  S/p bedside swallow evaluation 04/29/14 -- SLP recommended Dys 3, thin liquid diet.   Height:  Ht Readings from Last 1 Encounters:  07/16/14 6\' 2"  (1.88 m)    Weight:  Wt Readings from Last 1 Encounters:  07/16/14 204 lb (92.534 kg)    Ideal Body Weight:  86.3 kg  Wt Readings from Last 10 Encounters:  07/16/14 204 lb (92.534 kg)   06/18/14 209 lb (94.802 kg)  05/21/14 209 lb 4 oz (94.915 kg)  05/07/14 206 lb 12.7 oz (93.8 kg)  04/23/14 219 lb 3.2 oz (99.428 kg)  04/14/14 210 lb 1.6 oz (95.3 kg)  03/26/14 223 lb 7 oz (101.35 kg)  03/01/14 216 lb 7.9 oz (98.2 kg)  02/13/14 219 lb 12.8 oz (99.7 kg)  01/11/14 240 lb (108.863 kg)    BMI:  Body mass index is 26.18 kg/(m^2).  Estimated Nutritional Needs:  Kcal:  1900-2100  Protein:  100-110 gm  Fluid:  1.9-2.1 L  Skin:  Reviewed, no issues  Diet Order:  Diet regular Room service appropriate?: Yes; Fluid consistency:: Thin  EDUCATION NEEDS:  No education needs identified at this time   Intake/Output Summary (Last 24 hours) at 07/16/14 1123 Last data filed at 07/16/14 1000  Gross per 24 hour  Intake 2370.42 ml  Output    200 ml  Net 2170.42 ml    Last BM:  unknown  Arthur Holms, RD, LDN Pager #: [phone] After-Hours Pager #: [phone]

## 2014-07-16 NOTE — Progress Notes (Signed)
Utilization Review Completed.  

## 2014-07-16 NOTE — ED Provider Notes (Signed)
D/w dr Megan Salon with ID Recommends awaiting cultures, continue cefepime Continue to use PICC He would not recommend any further imaging/MR imaging This was endorsed to dr gardner   Ripley Fraise, MD 07/16/14 [phone]

## 2014-07-17 ENCOUNTER — Telehealth: Payer: Self-pay | Admitting: *Deleted

## 2014-07-17 DIAGNOSIS — R52 Pain, unspecified: Secondary | ICD-10-CM

## 2014-07-17 DIAGNOSIS — B9562 Methicillin resistant Staphylococcus aureus infection as the cause of diseases classified elsewhere: Secondary | ICD-10-CM

## 2014-07-17 DIAGNOSIS — R197 Diarrhea, unspecified: Secondary | ICD-10-CM

## 2014-07-17 DIAGNOSIS — N179 Acute kidney failure, unspecified: Secondary | ICD-10-CM

## 2014-07-17 LAB — URINE CULTURE
Colony Count: NO GROWTH
Culture: NO GROWTH

## 2014-07-17 LAB — CBC
HCT: 29.4 % — ABNORMAL LOW (ref 39.0–52.0)
Hemoglobin: 9.6 g/dL — ABNORMAL LOW (ref 13.0–17.0)
MCH: 28.5 pg (ref 26.0–34.0)
MCHC: 32.7 g/dL (ref 30.0–36.0)
MCV: 87.2 fL (ref 78.0–100.0)
Platelets: 77 10*3/uL — ABNORMAL LOW (ref 150–400)
RBC: 3.37 MIL/uL — AB (ref 4.22–5.81)
RDW: 17 % — ABNORMAL HIGH (ref 11.5–15.5)
WBC: 4.5 10*3/uL (ref 4.0–10.5)

## 2014-07-17 LAB — BASIC METABOLIC PANEL
ANION GAP: 8 (ref 5–15)
BUN: 36 mg/dL — ABNORMAL HIGH (ref 6–20)
CALCIUM: 8.8 mg/dL — AB (ref 8.9–10.3)
CO2: 23 mmol/L (ref 22–32)
CREATININE: 1.12 mg/dL (ref 0.61–1.24)
Chloride: 106 mmol/L (ref 101–111)
GFR calc Af Amer: >60 mL/min (ref >60)
GFR calc non Af Amer: 59 mL/min — ABNORMAL LOW (ref >60)
Glucose, Bld: 107 mg/dL — ABNORMAL HIGH (ref 70–99)
Potassium: 3.8 mmol/L (ref 3.5–5.1)
SODIUM: 137 mmol/L (ref 135–145)

## 2014-07-17 LAB — CLOSTRIDIUM DIFFICILE BY PCR: Toxigenic C. Difficile by PCR: NEGATIVE

## 2014-07-17 LAB — GLUCOSE, CAPILLARY
GLUCOSE-CAPILLARY: 86 mg/dL (ref 70–99)
Glucose-Capillary: 89 mg/dL (ref 70–99)
Glucose-Capillary: 102 mg/dL — ABNORMAL HIGH (ref 70–99)
Glucose-Capillary: 113 mg/dL — ABNORMAL HIGH (ref 70–99)

## 2014-07-17 MED ORDER — MUPIROCIN 2 % EX OINT
TOPICAL_OINTMENT | Freq: Two times a day (BID) | CUTANEOUS | Status: DC
  Administered 2014-07-17 – 2014-07-19 (×5): via NASAL
  Filled 2014-07-17 (×3): qty 22

## 2014-07-17 MED ORDER — SODIUM CHLORIDE 0.9 % IJ SOLN
10.0000 mL | INTRAMUSCULAR | Status: DC | PRN
Start: 2014-07-17 — End: 2014-07-19
  Administered 2014-07-18 – 2014-07-19 (×4): 10 mL
  Filled 2014-07-17 (×4): qty 40

## 2014-07-17 MED ORDER — CHLORHEXIDINE GLUCONATE CLOTH 2 % EX PADS
6.0000 | MEDICATED_PAD | Freq: Every day | CUTANEOUS | Status: DC
  Administered 2014-07-18 – 2014-07-19 (×2): 6 via TOPICAL

## 2014-07-17 MED ORDER — DEXTROSE 5 % IV SOLN
2.0000 g | Freq: Two times a day (BID) | INTRAVENOUS | Status: DC
  Administered 2014-07-17 – 2014-07-19 (×4): 2 g via INTRAVENOUS
  Filled 2014-07-17 (×5): qty 2

## 2014-07-17 NOTE — Progress Notes (Signed)
Patient QJ:[name]      DOB: February 05, 1930      EYC:[id number]   Palliative Medicine Team at [facility] Progress Note    Subjective: Has some left shoulder pain since fall Friday.  Controlled with hydrocodone PRN.  Fevers resolves.  Moving bowels frequently.      Filed Vitals:   07/17/14 0530  BP: 135/44  Pulse: 64  Temp: 98.1 F (36.7 C)  Resp:    Physical exam: GEN: alert, NAD CV: RRR LUNGS: CTAB ABD: soft, NT, ND EXT: warm   CBC    Component Value Date/Time   WBC 4.5 07/17/2014 0606   WBC 5.8 12/26/2013 1105   WBC 5.4 02/28/2007 0919   RBC 3.37* 07/17/2014 0606   RBC 3.35* 05/01/2014 1830   RBC 4.10* 12/26/2013 1105   RBC 5.21 02/28/2007 0919   HGB 9.6* 07/17/2014 0606   HGB 11.0* 12/26/2013 1105   HGB 15.0 02/28/2007 0919   HCT 29.4* 07/17/2014 0606   HCT 34.5* 12/26/2013 1105   HCT 44.0 02/28/2007 0919   PLT 77* 07/17/2014 0606   PLT 157 12/26/2013 1105   PLT 153 02/28/2007 0919   MCV 87.2 07/17/2014 0606   MCV 84 12/26/2013 1105   MCV 84.4 02/28/2007 0919   MCH 28.5 07/17/2014 0606   MCH 26.8* 12/26/2013 1105   MCH 28.7 02/28/2007 0919   MCHC 32.7 07/17/2014 0606   MCHC 31.9* 12/26/2013 1105   MCHC 34.0 02/28/2007 0919   RDW 17.0* 07/17/2014 0606   RDW 17.5* 12/26/2013 1105   RDW 14.9* 02/28/2007 0919   LYMPHSABS 0.4* 07/15/2014 2259   LYMPHSABS 0.9 12/26/2013 1105   LYMPHSABS 1.6 02/28/2007 0919   MONOABS 0.6 07/15/2014 2259   MONOABS 0.6 02/28/2007 0919   EOSABS 0.0 07/15/2014 2259   EOSABS 0.1 12/26/2013 1105   EOSABS 0.3 02/28/2007 0919   BASOSABS 0.0 07/15/2014 2259   BASOSABS 0.0 12/26/2013 1105   BASOSABS 0.0 02/28/2007 0919    CMP     Component Value Date/Time   NA 137 07/17/2014 0606   NA 140 10/31/2013 0839   K 3.8 07/17/2014 0606   K 3.9 10/31/2013 0839   CL 106 07/17/2014 0606   CL 103 10/31/2013 0839   CO2 23 07/17/2014 0606   CO2 27 10/31/2013 0839   GLUCOSE 107* 07/17/2014 0606   GLUCOSE 93 10/31/2013 0839    BUN 36* 07/17/2014 0606   BUN 17 10/31/2013 0839   CREATININE 1.12 07/17/2014 0606   CREATININE 1.1 10/31/2013 0839   CALCIUM 8.8* 07/17/2014 0606   CALCIUM 8.9 10/31/2013 0839   PROT 7.6 07/15/2014 2259   ALBUMIN 2.7* 07/15/2014 2259   AST 32 07/15/2014 2259   ALT 21 07/15/2014 2259   ALKPHOS 80 07/15/2014 2259   BILITOT 1.0 07/15/2014 2259   GFRNONAA 59* 07/17/2014 0606   GFRAA >60 07/17/2014 0606     Assessment and plan: Patient is a 81 yo male with PMHx of thoraco-aortic aneurysm s/p graft repair in July 1497 and complicated by recurrent pseudomonal graft infection, DVT, C-diff admitted with recurrent fever  1. Code Status:  Partial, no intubation only. See discussion below.    2. GOC: See initial consult.  Spoke some with [name] today. He still doesn't have hearing aides and hard to say how much he understands about what is going on.  Spoke with [name] today who as before is waiting more information from culture results and ID.  I asked about code status as this  has been changed a few times in past. Partial code as above with only limitation being on intubation. We talked about how CPR would be potentially inappropriate given that if needing CPR we are almost certainly talking respiratory arrest as well.  She is aware and this has been discussed before. Will need ongoing conversation but I think if only option going forward is comfort care, this will be easier discussion for them.  I will be off service after today, but will have our team follow.   3. Symptom Management:  Fevers- Resolved Shoulder Pain- 2/2 fall.  PRN hydrocodone. Could consider sched tylenol if wanting to minimize opioids, or even lidoderm patch.  Diarrhea- h/o c-diff. On chronic PO vanc.  ID rechecking PCR.   4. Psychosocial/Spiritual: From [place]. Has 3 daughters and 1 son. Has been hospitalized or in nursing facility for past several months until going home after last discharge  Total  Time: 25 minutes >50% of time spent in counseling and coordination of care regarding above.   Doran Clay D.O. Palliative Medicine Team at [facility]  Pager: [phone] Team Phone: [phone]

## 2014-07-17 NOTE — Consult Note (Signed)
   [facility] CM Inpatient Consult   07/17/2014  [name] [birth year] [id number] Referral received.  Patient is currently working with therapy.  Will follow up for [facility] Care Management needs.  Will update inpatient RNCM of following.  For questions: Natividad Brood, RN BSN [facility] Liaison  [phone] business mobile phone

## 2014-07-17 NOTE — Telephone Encounter (Signed)
Call from patient's daughter, [name] requesting that Dr. Johnnye Sima call her at [phone]. She was not able to be at the hospital when Dr. Johnnye Sima came by and has some questions for the MD.

## 2014-07-17 NOTE — Evaluation (Signed)
Physical Therapy Evaluation Patient Details Name: [name] MRN: [id number] DOB: 12/20/1929 Today's Date: 07/17/2014   History of Present Illness  81 y.o. 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 difficile colitis controlled on oral vancomycin, presented to ED with complaints of fever ( Multiple PO temperature readings of over 99 felt to be inaccurate so they took temperature under arm, under arm reading at home of 106.7). Patient has no other complaints other than mid back pain following a fall last week which has persisted. Other PMH: HTN, HLD, DVT s/p IVC filter, G-tube feeds. Admitted for infection of aortic graft. Infectious disease was consulted. Met sepsis criteria on arrival (blood pressure 87/73, pulse 125, RR 38) and potential source.  Clinical Impression   Pt admitted with above diagnosis. Pt currently with functional limitations due to the deficits listed below (see PT Problem List).  Pt will benefit from skilled PT to increase their independence and safety with mobility to allow discharge to the venue listed below.       Follow Up Recommendations Home health PT;Supervision/Assistance - 24 hour    Equipment Recommendations  3in1 (PT)    Recommendations for Other Services       Precautions / Restrictions        Mobility  Bed Mobility Overal bed mobility: Needs Assistance Bed Mobility: Rolling;Sidelying to Sit Rolling: Min assist Sidelying to sit: Mod assist       General bed mobility comments: min assist for log roll technique for comfort; light mod assist to sit  Transfers Overall transfer level: Needs assistance Equipment used: Rolling walker (2 wheeled) Transfers: Sit to/from Stand Sit to Stand: Min assist         General transfer  comment: min power-up assist  Ambulation/Gait Ambulation/Gait assistance: Min guard Ambulation Distance (Feet): 20 Feet Assistive device: Rolling walker (2 wheeled)       General Gait Details: cues for posture and RW proximity; daughter states he walks at home with trunk flexed  Stairs            Wheelchair Mobility    Modified Rankin (Stroke Patients Only)       Balance Overall balance assessment: Needs assistance           Standing balance-Leahy Scale: Poor Standing balance comment: heavy reliance on UEs                             Pertinent Vitals/Pain Pain Assessment: Faces Faces Pain Scale: Hurts little more Pain Location: back pain Pain Descriptors / Indicators: Aching Pain Intervention(s): Repositioned    Home Living Family/patient expects to be discharged to:: Private residence Living Arrangements: Spouse/significant other Available Help at Discharge: Family;Available 24 hours/day Type of Home: House Home Access: Stairs to enter Entrance Stairs-Rails: None Entrance Stairs-Number of Steps: 1 Home Layout: One level Home Equipment: Walker - 2 wheels;Cane - single point;Shower seat;Wheelchair - manual Additional Comments: Per palliative care note, pt to return home with 24 hour family care.    Prior Function Level of Independence: Needs assistance   Gait / Transfers Assistance Needed: States he ambulates with cane and RW at home  ADL's / Homemaking Assistance Needed: States he can bath/dress himself    Comments: He drives his SUV very short distance across  his yard to his "little building" and tinkers with his tools about every day for 45 min to an hour at a time; this most recent fall was getting out of his SUV     Hand Dominance   Dominant Hand: Right    Extremity/Trunk Assessment   Upper Extremity Assessment: Overall WFL for tasks assessed           Lower Extremity Assessment: Generalized weakness          Communication   Communication: HOH  Cognition Arousal/Alertness: Awake/alert Behavior During Therapy: WFL for tasks assessed/performed Overall Cognitive Status: Within Functional Limits for tasks assessed (for simple mobility tasks)                      General Comments      Exercises        Assessment/Plan    PT Assessment Patient needs continued PT services  PT Diagnosis Difficulty walking;Acute pain;Generalized weakness   PT Problem List Decreased strength;Decreased activity tolerance;Decreased balance;Decreased mobility;Decreased coordination;Decreased knowledge of use of DME;Decreased safety awareness;Pain  PT Treatment Interventions DME instruction;Gait training;Stair training;Functional mobility training;Therapeutic activities;Therapeutic exercise;Patient/family education   PT Goals (Current goals can be found in the Care Plan section) Acute Rehab PT Goals Patient Stated Goal: keep strength up PT Goal Formulation: With patient Time For Goal Achievement: 07/31/14 Potential to Achieve Goals: Good    Frequency Min 3X/week   Barriers to discharge        Co-evaluation               End of Session Equipment Utilized During Treatment: Gait belt (above feeding tube) Activity Tolerance: Patient tolerated treatment well Patient left: in chair;with call bell/phone within reach;with family/visitor present Nurse Communication: Mobility status         Time: 0816-8387 PT Time Calculation (min) (ACUTE ONLY): 32 min   Charges:   PT Evaluation $Initial PT Evaluation Tier I: 1 Procedure PT Treatments $Gait Training: 8-22 mins   PT G CodesQuin Hoop 07/17/2014, 4:50 PM  Roney Marion, [place] Pager [phone] Office [phone]

## 2014-07-17 NOTE — Progress Notes (Addendum)
PROGRESS NOTE    [name] MHW:[id number] DOB: 01-Jan-1930 DOA: 07/15/2014 PCP: [name], MD  HPI/Brief narrative 81 y.o. 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 difficile colitis controlled on oral vancomycin, presented to ED with complaints of fever ( Multiple PO temperature readings of over 99 felt to be inaccurate so they took temperature under arm, under arm reading at home of 101.7). Patient has no other complaints other than mid back pain following a fall last week which has persisted. Other PMH: HTN, HLD, DVT s/p IVC filter, G-tube feeds. Admitted for infection of aortic graft. Infectious disease was consulted. Met sepsis criteria on arrival (blood pressure 87/73, pulse 125, RR 38) and potential source.  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 difficile colitis - Continue PO vancomycin indefinitely - Patient reports diarrhea but already on treatment for C. difficile  Essential hypertension - Controlled off medications. Monitor  Hyperlipidemia - Continue statins  PEG tube feeding - As per family, patient only drinks Gatorade, water and takes pills by  mouth and gets tube feeding at night via G-tube - Dietitian consulted to resume tube feeds.  Acute kidney injury - May be related to ongoing infection - Brief IV fluids and resolved  Chronic anemia - Hemoglobin probably at baseline. Follow CBC - Gradual drop to 9.6.? Dilutional versus acute illness.  Thrombocytopenia - Chronic and intermittent - Now possibly related to infection and? Antibiotics - Follow CBC  Hard of hearing  History of DVT, s/p IVC filter  Low back pain - No acute findings on physical exam - X-rays without fracture    Code Status: Limited Family Communication: Discussed with daughter Bernestine Amass on 07/17/2014. Disposition Plan: Home when medically stable.    Consultants:  ID  Palliative care team  Procedures:  Has chronic left upper extremity PICC line  Has chronic PEG tube  Antibiotics:  IV Cefepime 5/3>  PO Vancomycin 5/3>   Subjective: Complains of mild intermittent mid back pain-states since follow week ago. Denies any other complaints. As per nursing, no acute events. Also reported diarrhea.  Objective: Filed Vitals:   07/16/14 1700 07/16/14 1941 07/17/14 0530 07/17/14 1413  BP: 143/64 130/65 135/44 148/74  Pulse: 66 70 64 62  Temp: 98 F (36.7 C) 98.4 F (36.9 C) 98.1 F (36.7 C) 98 F (36.7 C)  TempSrc: Oral Oral Oral Oral  Resp: _0 Height:   _1  (1.88 m)   Weight:   95.8 kg (211 lb 3.2 oz)   SpO2: 97% 98%  98%    Intake/Output Summary (Last 24 hours) at 07/17/14 1643 Last data filed at 07/17/14 1628  Gross per 24 hour  Intake    260 ml  Output   1100 ml  Net   -840 ml   Filed Weights   07/15/14 2248 07/16/14 0230 07/17/14 0530  Weight: 94.348 kg (208 lb) 92.534 kg (204 lb) 95.8 kg (211 lb 3.2 oz)     Exam:  General exam: Pleasant elderly male, quite hard of hearing, lying comfortably supine in bed. Does not look septic or toxic. Respiratory system: Clear. No increased work of  breathing. Cardiovascular system: S1 & S2 heard, RRR. No JVD, murmurs, gallops, clicks or pedal edema.  Gastrointestinal system: Abdomen is nondistended, soft and nontender. Normal bowel sounds heard. PEG tube site intact Central nervous system: Alert and oriented. No focal neurological deficits. Extremities: Symmetric 5 x 5 power. Left upper extremity PICC site without acute infection Musculoskeletal system: No acute findings on back exam   Data Reviewed: Basic Metabolic Panel:  Recent Labs Lab 07/15/14 2259 07/16/14 0329 07/17/14 0606  NA 137 137 137  K 3.6 4.1 3.8  CL 105 106 106  CO2 21* 20* 23  GLUCOSE 128* 118* 107*  BUN 39* 38* 36*  CREATININE 1.29* 1.27* 1.12  CALCIUM 9.8 9.1 8.8*   Liver Function Tests:  Recent Labs Lab 07/15/14 2259  AST 32  ALT 21  ALKPHOS 80  BILITOT 1.0  PROT 7.6  ALBUMIN 2.7*   No results for input(s): LIPASE, AMYLASE in the last 168 hours. No results for input(s): AMMONIA in the last 168 hours. CBC:  Recent Labs Lab 07/15/14 2259 07/16/14 0329 07/17/14 0606  WBC 8.5 9.3 4.5  NEUTROABS 7.5  --   --   HGB 12.8* 10.6* 9.6*  HCT 39.6 32.2* 29.4*  MCV 88.2 86.8 87.2  PLT 99* 80* 77*   Cardiac Enzymes: No results for input(s): CKTOTAL, CKMB, CKMBINDEX, TROPONINI in the last 168 hours. BNP (last 3 results) No results for input(s): PROBNP in the last 8760 hours. CBG:  Recent Labs Lab 07/17/14 0809 07/17/14 1210  GLUCAP 89 102*    Recent Results (from the past 240 hour(s))  Culture, blood (routine x 2)     Status: None (Preliminary result)   Collection Time: 07/15/14 11:08 PM  Result Value Ref Range Status   Specimen Description BLOOD RIGHT ARM  Final   Special Requests BOTTLES DRAWN AEROBIC AND ANAEROBIC 5CC  Final   Culture   Final           BLOOD CULTURE RECEIVED NO GROWTH TO DATE CULTURE WILL BE HELD FOR 5 DAYS BEFORE ISSUING A FINAL NEGATIVE REPORT Performed at [facility]    Report Status PENDING   Incomplete  Culture, blood (routine x 2)     Status: None (Preliminary result)   Collection Time: 07/16/14 12:22 AM  Result Value Ref Range Status   Specimen Description BLOOD RIGHT ARM  Final   Special Requests BOTTLES DRAWN AEROBIC AND ANAEROBIC 5CC  Final   Culture   Final           BLOOD CULTURE RECEIVED NO GROWTH TO DATE CULTURE WILL BE HELD FOR 5 DAYS BEFORE ISSUING A FINAL NEGATIVE REPORT Performed at [facility]    Report Status PENDING  Incomplete  MRSA PCR Screening     Status: Abnormal   Collection Time: 07/16/14  4:49 AM  Result Value Ref Range Status   MRSA by PCR POSITIVE (A) NEGATIVE Final    Comment:        The GeneXpert MRSA Assay (FDA approved for NASAL specimens only), is one component of a comprehensive MRSA colonization surveillance program. It  is not intended to diagnose MRSA infection nor to guide or monitor treatment for MRSA infections. RESULT CALLED TO, READ BACK BY AND VERIFIED WITH: RN,MIKKI POTTER [id number] _0  THANEY   Urine culture     Status: None   Collection Time: 07/16/14  6:48 AM  Result Value Ref Range Status   Specimen Description URINE, RANDOM  Final   Special Requests NONE  Final   Colony Count NO GROWTH Performed at [facility]   Final   Culture NO GROWTH Performed at [facility]   Final   Report Status 07/17/2014 FINAL  Final  Clostridium Difficile by PCR     Status: None   Collection Time: 07/17/14 12:07 PM  Result Value Ref Range Status   C difficile by pcr NEGATIVE NEGATIVE Final          Studies: Dg Chest 1 View  07/16/2014   CLINICAL DATA:  Upper to mid back pain after fall on Friday.  EXAM: CHEST  1 VIEW  COMPARISON:  05/08/2014  FINDINGS: There is an endoluminal descending thoracic aortic stent graft. There are unchanged moderately prominent aortic contours. There is a left upper extremity PICC line extending into the SVC. There is unchanged left hemidiaphragm elevation. There is mild  interstitial coarsening and mild linear left base scarring, unchanged.  IMPRESSION: No acute cardiopulmonary findings.   Electronically Signed   By: Andreas Newport M.D.   On: 07/16/2014 00:27   Dg Thoracic Spine W/swimmers  07/16/2014   CLINICAL DATA:  Upper mid back pain after fall on Friday  EXAM: THORACIC SPINE - 2 VIEW + SWIMMERS  COMPARISON:  None.  FINDINGS: There is no evidence of acute thoracic spine fracture. There is moderate kyphosis. There is moderately severe degenerative disc change at multiple levels. There is no bone lesion or bony destruction. The endoluminal aortic stent graft is again noted, without change and aortic contour.  IMPRESSION: Negative for acute thoracic spine fracture. Moderate kyphosis and degenerative change.   Electronically Signed   By: Andreas Newport M.D.   On: 07/16/2014 00:28        Scheduled Meds: . antiseptic oral rinse  7 mL Mouth Rinse q12n4p  . aspirin EC  81 mg Oral Daily  . ceFEPIme (MAXIPIME) 2 GM IVP  2 g Intravenous Q12H  . chlorhexidine  15 mL Mouth Rinse BID  . [START ON 07/18/2014] Chlorhexidine Gluconate Cloth  6 each Topical Q0600  . dronabinol  2.5 mg Oral TID  . feeding supplement (JEVITY 1.2 CAL)  1,000 mL Per Tube Q24H  . feeding supplement (PRO-STAT SUGAR FREE 64)  30 mL Per Tube TID  . fesoterodine  4 mg Oral Daily  . free water  200 mL Per Tube QID  . heparin  5,000 Units Subcutaneous 3 times per day  . magnesium oxide  200 mg Oral Daily  . mupirocin ointment   Nasal BID  . simvastatin  20 mg Oral Daily  . sodium chloride  3 mL Intravenous Q12H  . vancomycin  125 mg Oral QID  . vancomycin  750 mg Intravenous Q12H   Continuous Infusions:    Active Problems:   Sepsis   Bacterial infection due to Proteus mirabilis   Hx of Clostridium difficile infection   S/P AAA repair 2014 with chronic graft infection   Bacteremia due to Gram-negative bacteria   Pain   Diarrhea    Time spent: 76 minutes    Darol Cush,  MD, FACP, FHM. [facility]  Pager [phone]  If 7PM-7AM, please contact night-coverage [email or web address] Password [email or web address] 07/17/2014, 4:43 PM    LOS: 1 day

## 2014-07-17 NOTE — Progress Notes (Signed)
INFECTIOUS DISEASE PROGRESS NOTE  ID: [name] is a 81 y.o. 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   Bacteremia due to Gram-negative bacteria  Subjective: C/o of loose BM More awake today  Abtx:  Anti-infectives    Start     Dose/Rate Route Frequency Ordered Stop   07/17/14 1000  ceFEPIme (MAXIPIME) 2 g in dextrose 5 % 50 mL IVPB     2 g 100 mL/hr over 30 Minutes Intravenous Every 24 hours 07/16/14 1121     07/16/14 2359  vancomycin (VANCOCIN) IVPB 750 mg/150 ml premix     750 mg 150 mL/hr over 60 Minutes Intravenous Every 12 hours 07/16/14 1115     07/16/14 1200  vancomycin (VANCOCIN) 1,500 mg in sodium chloride 0.9 % 500 mL IVPB     1,500 mg 250 mL/hr over 120 Minutes Intravenous  Once 07/16/14 1115 07/16/14 1417   07/16/14 1000  vancomycin (VANCOCIN) 50 mg/mL oral solution 125 mg     125 mg Oral 4 times daily 07/16/14 0150     07/16/14 0230  ceFEPIme (MAXIPIME) 2 g in dextrose 5 % 50 mL IVPB  Status:  Discontinued     2 g 100 mL/hr over 30 Minutes Intravenous Every 12 hours 07/16/14 0217 07/16/14 1121   07/15/14 2330  levofloxacin (LEVAQUIN) IVPB 750 mg     750 mg 100 mL/hr over 90 Minutes Intravenous  Once 07/15/14 2321 07/16/14 0118      Medications:  Scheduled: . antiseptic oral rinse  7 mL Mouth Rinse q12n4p  . aspirin EC  81 mg Oral Daily  . ceFEPIme (MAXIPIME) 2 GM IVP  2 g Intravenous Q24H  . chlorhexidine  15 mL Mouth Rinse BID  . dronabinol  2.5 mg Oral TID  . feeding supplement (JEVITY 1.2 CAL)  1,000 mL Per Tube Q24H  . feeding supplement (PRO-STAT SUGAR FREE 64)  30 mL Per Tube TID  . fesoterodine  4 mg Oral Daily  . free water  200 mL Per Tube QID  . heparin  5,000 Units Subcutaneous 3 times per day  . magnesium oxide  200 mg Oral Daily  . simvastatin  20 mg Oral Daily  . sodium chloride  3 mL Intravenous  Q12H  . vancomycin  125 mg Oral QID  . vancomycin  750 mg Intravenous Q12H    Objective: Vital signs in last 24 hours: Temp:  [97.7 F (36.5 C)-98.4 F (36.9 C)] 98.1 F (36.7 C) (05/04 0530) Pulse Rate:  [64-70] 64 (05/04 0530) Resp:  [16-22] 16 (05/03 1941) BP: (105-143)/(44-65) 135/44 mmHg (05/04 0530) SpO2:  [96 %-98 %] 98 % (05/03 1941) Weight:  [95.8 kg (211 lb 3.2 oz)] 95.8 kg (211 lb 3.2 oz) (05/04 0530)   General appearance: alert, cooperative and no distress Resp: clear to auscultation bilaterally Cardio: regular rate and rhythm GI: normal findings: bowel sounds normal and soft, non-tender  Lab Results  Recent Labs  07/16/14 0329 07/17/14 0606  WBC 9.3 4.5  HGB 10.6* 9.6*  HCT 32.2* 29.4*  NA 137 137  K 4.1 3.8  CL 106 106  CO2 20* 23  BUN 38* 36*  CREATININE 1.27* 1.12   Liver Panel  Recent Labs  07/15/14 2259  PROT 7.6  ALBUMIN 2.7*  AST 32  ALT 21  ALKPHOS 80  BILITOT 1.0   Sedimentation Rate No results for input(s): ESRSEDRATE in the last 72 hours. C-Reactive Protein No results for input(s): CRP in the last 72 hours.  Microbiology: Recent Results (from the past 240 hour(s))  Culture, blood (routine x 2)     Status: None (Preliminary result)   Collection Time: 07/15/14 11:08 PM  Result Value Ref Range Status   Specimen Description BLOOD RIGHT ARM  Final   Special Requests BOTTLES DRAWN AEROBIC AND ANAEROBIC 5CC  Final   Culture   Final           BLOOD CULTURE RECEIVED NO GROWTH TO DATE CULTURE WILL BE HELD FOR 5 DAYS BEFORE ISSUING A FINAL NEGATIVE REPORT Performed at [facility]    Report Status PENDING  Incomplete  Culture, blood (routine x 2)     Status: None (Preliminary result)   Collection Time: 07/16/14 12:22 AM  Result Value Ref Range Status   Specimen Description BLOOD RIGHT ARM  Final   Special Requests BOTTLES DRAWN AEROBIC AND ANAEROBIC 5CC  Final   Culture   Final           BLOOD CULTURE RECEIVED NO GROWTH TO  DATE CULTURE WILL BE HELD FOR 5 DAYS BEFORE ISSUING A FINAL NEGATIVE REPORT Performed at [facility]    Report Status PENDING  Incomplete  MRSA PCR Screening     Status: Abnormal   Collection Time: 07/16/14  4:49 AM  Result Value Ref Range Status   MRSA by PCR POSITIVE (A) NEGATIVE Final    Comment:        The GeneXpert MRSA Assay (FDA approved for NASAL specimens only), is one component of a comprehensive MRSA colonization surveillance program. It is not intended to diagnose MRSA infection nor to guide or monitor treatment for MRSA infections. RESULT CALLED TO, READ BACK BY AND VERIFIED WITH: RN,MIKKI POTTER [id number] @0725  THANEY     Studies/Results: Dg Chest 1 View  07/16/2014   CLINICAL DATA:  Upper to mid back pain after fall on Friday.  EXAM: CHEST  1 VIEW  COMPARISON:  05/08/2014  FINDINGS: There is an endoluminal descending thoracic aortic stent graft. There are unchanged moderately prominent aortic contours. There is a left upper extremity PICC line extending into the SVC. There is unchanged left hemidiaphragm elevation. There is mild interstitial coarsening and mild linear left base scarring, unchanged.  IMPRESSION: No acute cardiopulmonary findings.   Electronically Signed   By: Andreas Newport M.D.   On: 07/16/2014 00:27   Dg Thoracic Spine W/swimmers  07/16/2014   CLINICAL DATA:  Upper mid back pain after fall on Friday  EXAM: THORACIC SPINE - 2 VIEW + SWIMMERS  COMPARISON:  None.  FINDINGS: There is no evidence of acute thoracic spine fracture. There is moderate kyphosis. There is moderately severe degenerative disc change at multiple levels. There is no bone lesion or bony destruction. The endoluminal aortic stent graft is again noted, without change and aortic contour.  IMPRESSION: Negative for acute thoracic spine fracture. Moderate kyphosis and degenerative change.   Electronically Signed   By: Andreas Newport M.D.   On: 07/16/2014 00:28      Assessment/Plan: Fever AAA infection (pseudomonas, proteus)  Total days of antibiotics: day 2 vanco    Cefepime (05-07-14)  My great appreciation to Dr Deitra Mayo and [facility] He is much more awake today.  No documented fever in hospital.  Await Cx No fever documented in hospital.  No change in anbx for  now Will check C diff.        Bobby Rumpf Infectious Diseases (pager) [phone] [email or web address] 07/17/2014, 11:30 AM  LOS: 1 day

## 2014-07-18 DIAGNOSIS — Z931 Gastrostomy status: Secondary | ICD-10-CM

## 2014-07-18 DIAGNOSIS — E46 Unspecified protein-calorie malnutrition: Secondary | ICD-10-CM

## 2014-07-18 LAB — BASIC METABOLIC PANEL
Anion gap: 6 (ref 5–15)
BUN: 34 mg/dL — ABNORMAL HIGH (ref 6–20)
CALCIUM: 8.9 mg/dL (ref 8.9–10.3)
CO2: 25 mmol/L (ref 22–32)
Chloride: 107 mmol/L (ref 101–111)
Creatinine, Ser: 0.98 mg/dL (ref 0.61–1.24)
GFR calc Af Amer: >60 mL/min (ref >60)
GLUCOSE: 93 mg/dL (ref 70–99)
Potassium: 3.9 mmol/L (ref 3.5–5.1)
Sodium: 138 mmol/L (ref 135–145)

## 2014-07-18 LAB — CBC
HEMATOCRIT: 31.3 % — AB (ref 39.0–52.0)
Hemoglobin: 10 g/dL — ABNORMAL LOW (ref 13.0–17.0)
MCH: 27.9 pg (ref 26.0–34.0)
MCHC: 31.9 g/dL (ref 30.0–36.0)
MCV: 87.2 fL (ref 78.0–100.0)
PLATELETS: 84 10*3/uL — AB (ref 150–400)
RBC: 3.59 MIL/uL — AB (ref 4.22–5.81)
RDW: 16.8 % — ABNORMAL HIGH (ref 11.5–15.5)
WBC: 3.8 10*3/uL — AB (ref 4.0–10.5)

## 2014-07-18 LAB — GLUCOSE, CAPILLARY
GLUCOSE-CAPILLARY: 94 mg/dL (ref 70–99)
GLUCOSE-CAPILLARY: 102 mg/dL — AB (ref 70–99)
GLUCOSE-CAPILLARY: 88 mg/dL (ref 70–99)
GLUCOSE-CAPILLARY: 101 mg/dL — AB (ref 70–99)
Glucose-Capillary: 124 mg/dL — ABNORMAL HIGH (ref 70–99)
Glucose-Capillary: 137 mg/dL — ABNORMAL HIGH (ref 70–99)

## 2014-07-18 MED ORDER — VANCOMYCIN 50 MG/ML ORAL SOLUTION
125.0000 mg | Freq: Four times a day (QID) | ORAL | Status: DC
  Administered 2014-07-18 – 2014-07-19 (×5): 125 mg via ORAL
  Filled 2014-07-18 (×9): qty 2.5

## 2014-07-18 MED ORDER — VANCOMYCIN HCL 10 G IV SOLR
1250.0000 mg | Freq: Two times a day (BID) | INTRAVENOUS | Status: DC
  Administered 2014-07-19: 1250 mg via INTRAVENOUS
  Filled 2014-07-18 (×4): qty 1250

## 2014-07-18 NOTE — Progress Notes (Signed)
**Note Elijah-Identified via Obfuscation** INFECTIOUS DISEASE PROGRESS NOTE  ID: [name] is a 81 y.o. 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 No loose BM.   Abtx:  Anti-infectives    Start     Dose/Rate Route Frequency Ordered Stop   07/17/14 2200  ceFEPIme (MAXIPIME) 2 g in dextrose 5 % 50 mL IVPB     2 g 100 mL/hr over 30 Minutes Intravenous Every 12 hours 07/17/14 1415     07/17/14 1000  ceFEPIme (MAXIPIME) 2 g in dextrose 5 % 50 mL IVPB  Status:  Discontinued     2 g 100 mL/hr over 30 Minutes Intravenous Every 24 hours 07/16/14 1121 07/17/14 1415   07/16/14 2359  vancomycin (VANCOCIN) IVPB 750 mg/150 ml premix     750 mg 150 mL/hr over 60 Minutes Intravenous Every 12 hours 07/16/14 1115     07/16/14 1200  vancomycin (VANCOCIN) 1,500 mg in sodium chloride 0.9 % 500 mL IVPB     1,500 mg 250 mL/hr over 120 Minutes Intravenous  Once 07/16/14 1115 07/16/14 1417   07/16/14 1000  vancomycin (VANCOCIN) 50 mg/mL oral solution 125 mg     125 mg Oral 4 times daily 07/16/14 0150     07/16/14 0230  ceFEPIme (MAXIPIME) 2 g in dextrose 5 % 50 mL IVPB  Status:  Discontinued     2 g 100 mL/hr over 30 Minutes Intravenous Every 12 hours 07/16/14 0217 07/16/14 1121   07/15/14 2330  levofloxacin (LEVAQUIN) IVPB 750 mg     750 mg 100 mL/hr over 90 Minutes Intravenous  Once 07/15/14 2321 07/16/14 0118      Medications:  Scheduled: . aspirin EC  81 mg Oral Daily  . ceFEPIme (MAXIPIME) 2 GM IVP  2 g Intravenous Q12H  . Chlorhexidine Gluconate Cloth  6 each Topical Q0600  . dronabinol  2.5 mg Oral TID  . feeding supplement (JEVITY 1.2 CAL)  1,000 mL Per Tube Q24H  . feeding supplement (PRO-STAT SUGAR FREE 64)  30 mL Per Tube TID  . fesoterodine  4 mg Oral Daily  . free  water  200 mL Per Tube QID  . heparin  5,000 Units Subcutaneous 3 times per day  . magnesium oxide  200 mg Oral Daily  . mupirocin ointment   Nasal BID  . simvastatin  20 mg Oral Daily  . sodium chloride  3 mL Intravenous Q12H  . vancomycin  750 mg Intravenous Q12H    Objective: Vital signs in last 24 hours: Temp:  [97.6 F (36.4 C)-98.1 F (36.7 C)] 97.6 F (36.4 C) (05/05 0458) Pulse Rate:  [62-79] 68 (05/05 0458) Resp:  [16-18] 16 (05/05 0458) BP: (123-148)/(73-79) 136/73 mmHg (05/05 0458) SpO2:  [98 %] 98 % (05/05 0458) Weight:  [97 kg (213 lb 13.5 oz)] 97 kg (213 lb 13.5 oz) (05/05 0455)   General appearance: alert, cooperative and no distress Resp: clear to auscultation bilaterally Cardio: regular rate and rhythm GI: normal findings: bowel sounds normal and soft, non-tender Extremities: LUE PIC is clean, non-tender.   Lab Results  Recent Labs  07/17/14 0606 07/18/14 0605  WBC 4.5 3.8*  HGB 9.6* 10.0*  HCT 29.4* 31.3*  NA 137 138  K 3.8 3.9  CL 106 107  CO2 23 25  BUN 36* 34*  CREATININE 1.12 0.98   Liver Panel  Recent Labs  07/15/14 2259  PROT 7.6  ALBUMIN 2.7*  AST 32  ALT 21  ALKPHOS 80  BILITOT 1.0   Sedimentation Rate No results for input(s): ESRSEDRATE in the last 72 hours. C-Reactive Protein No results for input(s): CRP in the last 72 hours.  Microbiology: Recent Results (from the past 240 hour(s))  Culture, blood (routine x 2)     Status: None (Preliminary result)   Collection Time: 07/15/14 11:08 PM  Result Value Ref Range Status   Specimen Description BLOOD RIGHT ARM  Final   Special Requests BOTTLES DRAWN AEROBIC AND ANAEROBIC 5CC  Final   Culture   Final           BLOOD CULTURE RECEIVED NO GROWTH TO DATE CULTURE WILL BE HELD FOR 5 DAYS BEFORE ISSUING A FINAL NEGATIVE REPORT Performed at [facility]    Report Status PENDING  Incomplete  Culture, blood (routine x 2)     Status: None (Preliminary result)   Collection  Time: 07/16/14 12:22 AM  Result Value Ref Range Status   Specimen Description BLOOD RIGHT ARM  Final   Special Requests BOTTLES DRAWN AEROBIC AND ANAEROBIC 5CC  Final   Culture   Final           BLOOD CULTURE RECEIVED NO GROWTH TO DATE CULTURE WILL BE HELD FOR 5 DAYS BEFORE ISSUING A FINAL NEGATIVE REPORT Performed at [facility]    Report Status PENDING  Incomplete  MRSA PCR Screening     Status: Abnormal   Collection Time: 07/16/14  4:49 AM  Result Value Ref Range Status   MRSA by PCR POSITIVE (A) NEGATIVE Final    Comment:        The GeneXpert MRSA Assay (FDA approved for NASAL specimens only), is one component of a comprehensive MRSA colonization surveillance program. It is not intended to diagnose MRSA infection nor to guide or monitor treatment for MRSA infections. RESULT CALLED TO, READ BACK BY AND VERIFIED WITH: RN,MIKKI POTTER [id number] @0725  THANEY   Urine culture     Status: None   Collection Time: 07/16/14  6:48 AM  Result Value Ref Range Status   Specimen Description URINE, RANDOM  Final   Special Requests NONE  Final   Colony Count NO GROWTH Performed at [facility]   Final   Culture NO GROWTH Performed at [facility]   Final   Report Status 07/17/2014 FINAL  Final  Clostridium Difficile by PCR     Status: None   Collection Time: 07/17/14 12:07 PM  Result Value Ref Range Status   C difficile by pcr NEGATIVE NEGATIVE Final    Studies/Results: No results found.   Assessment/Plan: Fever (temp 101.6 per daughter, not 106!) AAA infection (pseudomonas, proteus) Protein calorie malnutrition- g-tube feeds, marinol Prev c diff   Total days of antibiotics: day 2 vanco IV and PO Cefepime (05-07-14)  My great appreciation to Dr Deitra Mayo and [facility] No documented fever in hospital.  Await Cx- so far ngtd No change in anbx for now, will stop IV vanco soon.  C diff is -, will remain on po vancodue to  prolonged anbx and hx of C diff.  Consider megace? Nutrition eval? Appreciate PT f/u Has ID f/u appt 5-17 Daughter updated.          Bobby Rumpf Infectious  Diseases (pager) [phone] [email or web address] 07/18/2014, 11:52 AM  LOS: 2 days

## 2014-07-18 NOTE — Progress Notes (Signed)
 PROGRESS NOTE    [name] MRN:[id number] DOB: 11/20/1929 DOA: 07/15/2014 PCP: MACKENZIE,BRIAN, MD  HPI/Brief narrative 81 y.o. 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 difficile colitis controlled on oral vancomycin, presented to ED with complaints of fever ( Multiple PO temperature readings of over 99 felt to be inaccurate so they took temperature under arm, under arm reading at home of 101.7). Patient has no other complaints other than mid back pain following a fall last week which has persisted. Other PMH: HTN, HLD, DVT s/p IVC filter, G-tube feeds. Admitted for infection of aortic graft. Infectious disease was consulted. Met sepsis criteria on arrival (blood pressure 87/73, pulse 125, RR 38) and potential source.  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, now with worsening & sepsis  - Followed by infectious disease as outpatient - Not curable with antibiotic treatment - Lifelong suppressive therapy - On IV cefepime via left upper arm PICC line at home (PO quinolones considered but deferred due to risk of prolonged QT and arrhythmias) - Blood culture results are negative to date - Infectious disease consulted and recommended continuing IV cefepime and added vancomycin - Chest x-ray without acute findings - Urine microscopy: Not indicative of UTI - No change in antibiotics for now - As per ID, continue current antibiotic regimen until 5/6 and if continued to be negative, DC vancomycin and DC home on IV cefepime and by mouth vancomycin for history of C. difficile  C. difficile colitis - Continue PO vancomycin indefinitely - Patient had some episodes of diarrhea on 5/4-self-limited. - C. difficile PCR:  Negative  Essential hypertension - Controlled off medications. Monitor  Hyperlipidemia - Continue statins  PEG tube feeding - As per family, patient only drinks Gatorade, water and takes pills by mouth and gets tube feeding at night via G-tube - Dietitian consulted to resume tube feeds.  Acute kidney injury - May be related to ongoing infection - Brief IV fluids and resolved  Chronic anemia - Hemoglobin probably at baseline. Follow CBC - Gradual drop to 9.6.? Dilutional versus acute illness. - Stable  Thrombocytopenia - Chronic and intermittent - Now possibly related to infection and? Antibiotics - Stable  Hard of hearing  History of DVT, s/p IVC filter  Low back pain - No acute findings on physical exam - X-rays without fracture - Improving    Code Status: Limited Family Communication: Discussed with daughter Brenda Walker on 07/17/2014. Discussed with patient's son at bedside today. Disposition Plan:  DC home possibly 5/6   Consultants:  ID  Palliative care team  Procedures:  Has chronic left upper extremity PICC line  Has chronic PEG tube  Antibiotics:  IV Cefepime 5/3>  PO Vancomycin 5/3>    IV vancomycin 5/3 >  Subjective: Denies complaints. Anxious to go home. No more diarrhea since yesterday. Has not complained of back pain.   Objective: Filed Vitals:   07/17/14 2227 07/18/14 0455 07/18/14 0458 07/18/14 1518  BP: 123/79  136/73 163/80  Pulse: 79  68   Temp: 98.1 F (36.7 C)  97.6 F (36.4 C) 98.2 F (36.8 C)  TempSrc: Oral  Oral Oral  Resp: 18  16 18  Height:      Weight:  97 kg (213 lb 13.5 oz)    SpO2:   98%  98% 98%    Intake/Output Summary (Last 24 hours) at 07/18/14 1553 Last data filed at 07/18/14 1227  Gross per 24 hour  Intake     70 ml  Output   2450 ml  Net  -2380 ml   Filed Weights   07/16/14 0230 07/17/14 0530 07/18/14 0455  Weight: 92.534 kg (204 lb) 95.8 kg (211 lb 3.2 oz) 97 kg (213 lb 13.5 oz)      Exam:  General exam: Pleasant elderly male, quite hard of hearing, lying comfortably supine in bed.  Respiratory system: Clear. No increased work of breathing. Cardiovascular system: S1 & S2 heard, RRR. No JVD, murmurs, gallops, clicks or pedal edema.  Gastrointestinal system: Abdomen is nondistended, soft and nontender. Normal bowel sounds heard. PEG tube site intact Central nervous system: Alert and oriented. No focal neurological deficits. Extremities: Symmetric 5 x 5 power. Left upper extremity PICC site without acute infection Musculoskeletal system: No acute findings on back exam   Data Reviewed: Basic Metabolic Panel:  Recent Labs Lab 07/15/14 2259 07/16/14 0329 07/17/14 0606 07/18/14 0605  NA 137 137 137 138  K 3.6 4.1 3.8 3.9  CL 105 106 106 107  CO2 21* 20* 23 25  GLUCOSE 128* 118* 107* 93  BUN 39* 38* 36* 34*  CREATININE 1.29* 1.27* 1.12 0.98  CALCIUM 9.8 9.1 8.8* 8.9   Liver Function Tests:  Recent Labs Lab 07/15/14 2259  AST 32  ALT 21  ALKPHOS 80  BILITOT 1.0  PROT 7.6  ALBUMIN 2.7*   No results for input(s): LIPASE, AMYLASE in the last 168 hours. No results for input(s): AMMONIA in the last 168 hours. CBC:  Recent Labs Lab 07/15/14 2259 07/16/14 0329 07/17/14 0606 07/18/14 0605  WBC 8.5 9.3 4.5 3.8*  NEUTROABS 7.5  --   --   --   HGB 12.8* 10.6* 9.6* 10.0*  HCT 39.6 32.2* 29.4* 31.3*  MCV 88.2 86.8 87.2 87.2  PLT 99* 80* 77* 84*   Cardiac Enzymes: No results for input(s): CKTOTAL, CKMB, CKMBINDEX, TROPONINI in the last 168 hours. BNP (last 3 results) No results for input(s): PROBNP in the last 8760 hours. CBG:  Recent Labs Lab 07/17/14 2035 07/18/14 0021 07/18/14 0454 07/18/14 0807 07/18/14 1226  GLUCAP 113* 124* 137* 94 102*    Recent Results (from the past 240 hour(s))  Culture, blood (routine x 2)     Status: None (Preliminary result)   Collection Time: 07/15/14 11:08 PM  Result Value Ref Range Status   Specimen  Description BLOOD RIGHT ARM  Final   Special Requests BOTTLES DRAWN AEROBIC AND ANAEROBIC 5CC  Final   Culture   Final           BLOOD CULTURE RECEIVED NO GROWTH TO DATE CULTURE WILL BE HELD FOR 5 DAYS BEFORE ISSUING A FINAL NEGATIVE REPORT Performed at [facility]    Report Status PENDING  Incomplete  Culture, blood (routine x 2)     Status: None (Preliminary result)   Collection Time: 07/16/14 12:22 AM  Result Value Ref Range Status   Specimen Description BLOOD RIGHT ARM  Final   Special Requests BOTTLES DRAWN AEROBIC AND ANAEROBIC 5CC  Final   Culture   Final           BLOOD CULTURE RECEIVED NO GROWTH TO DATE CULTURE WILL BE HELD FOR 5 DAYS BEFORE ISSUING A FINAL NEGATIVE REPORT Performed at [facility]    Report Status PENDING    Incomplete  MRSA PCR Screening     Status: Abnormal   Collection Time: 07/16/14  4:49 AM  Result Value Ref Range Status   MRSA by PCR POSITIVE (A) NEGATIVE Final    Comment:        The GeneXpert MRSA Assay (FDA approved for NASAL specimens only), is one component of a comprehensive MRSA colonization surveillance program. It is not intended to diagnose MRSA infection nor to guide or monitor treatment for MRSA infections. RESULT CALLED TO, READ BACK BY AND VERIFIED WITH: RN,MIKKI POTTER [id number] _0  THANEY   Urine culture     Status: None   Collection Time: 07/16/14  6:48 AM  Result Value Ref Range Status   Specimen Description URINE, RANDOM  Final   Special Requests NONE  Final   Colony Count NO GROWTH Performed at [facility]   Final   Culture NO GROWTH Performed at [facility]   Final   Report Status 07/17/2014 FINAL  Final  Clostridium Difficile by PCR     Status: None   Collection Time: 07/17/14 12:07 PM  Result Value Ref Range Status   C difficile by pcr NEGATIVE NEGATIVE Final          Studies: No results found.      Scheduled Meds: . aspirin EC  81 mg Oral Daily  . ceFEPIme  (MAXIPIME) 2 GM IVP  2 g Intravenous Q12H  . Chlorhexidine Gluconate Cloth  6 each Topical Q0600  . dronabinol  2.5 mg Oral TID  . feeding supplement (JEVITY 1.2 CAL)  1,000 mL Per Tube Q24H  . feeding supplement (PRO-STAT SUGAR FREE 64)  30 mL Per Tube TID  . fesoterodine  4 mg Oral Daily  . free water  200 mL Per Tube QID  . heparin  5,000 Units Subcutaneous 3 times per day  . magnesium oxide  200 mg Oral Daily  . mupirocin ointment   Nasal BID  . simvastatin  20 mg Oral Daily  . sodium chloride  3 mL Intravenous Q12H  . [START ON 07/19/2014] vancomycin  1,250 mg Intravenous Q12H  . 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 [facility] Pager [phone]  If 7PM-7AM, please contact night-coverage [email or web address] Password [email or web address] 07/18/2014, 3:53 PM    LOS: 2 days

## 2014-07-18 NOTE — Progress Notes (Signed)
ANTIBIOTIC CONSULT NOTE - FOLLOW UP  Pharmacy Consult for Vancomycin Indication: Bacteremia  Allergies  Allergen Reactions  . Atorvastatin Other (See Comments)    Causes constipation  . Penicillins Other (See Comments)    Other reaction(s): RASH HIVES  Tolerates cefepime 2/22    Patient Measurements: Height: 6\' 2"  (188 cm) Weight: 213 lb 13.5 oz (97 kg) IBW/kg (Calculated) : 82.2 Adjusted Body Weight:    Vital Signs: Temp: 97.6 F (36.4 C) (05/05 0458) Temp Source: Oral (05/05 0458) BP: 136/73 mmHg (05/05 0458) Pulse Rate: 68 (05/05 0458) Intake/Output from previous day: 05/04 0701 - 05/05 0700 In: 270 [P.O.:260; I.V.:10] Out: 1850 [Urine:1850] Intake/Output from this shift: Total I/O In: 0  Out: 300 [Urine:300]  Labs:  Recent Labs  07/16/14 0329 07/17/14 0606 07/18/14 0605  WBC 9.3 4.5 3.8*  HGB 10.6* 9.6* 10.0*  PLT 80* 77* 84*  CREATININE 1.27* 1.12 0.98   Estimated Creatinine Clearance: 67.6 mL/min (by C-G formula based on Cr of 0.98). No results for input(s): VANCOTROUGH, VANCOPEAK, VANCORANDOM, GENTTROUGH, GENTPEAK, GENTRANDOM, TOBRATROUGH, TOBRAPEAK, TOBRARND, AMIKACINPEAK, AMIKACINTROU, AMIKACIN in the last 72 hours.   Microbiology: Recent Results (from the past 720 hour(s))  Culture, blood (routine x 2)     Status: None (Preliminary result)   Collection Time: 07/15/14 11:08 PM  Result Value Ref Range Status   Specimen Description BLOOD RIGHT ARM  Final   Special Requests BOTTLES DRAWN AEROBIC AND ANAEROBIC 5CC  Final   Culture   Final           BLOOD CULTURE RECEIVED NO GROWTH TO DATE CULTURE WILL BE HELD FOR 5 DAYS BEFORE ISSUING A FINAL NEGATIVE REPORT Performed at [facility]    Report Status PENDING  Incomplete  Culture, blood (routine x 2)     Status: None (Preliminary result)   Collection Time: 07/16/14 12:22 AM  Result Value Ref Range Status   Specimen Description BLOOD RIGHT ARM  Final   Special Requests BOTTLES DRAWN  AEROBIC AND ANAEROBIC 5CC  Final   Culture   Final           BLOOD CULTURE RECEIVED NO GROWTH TO DATE CULTURE WILL BE HELD FOR 5 DAYS BEFORE ISSUING A FINAL NEGATIVE REPORT Performed at [facility]    Report Status PENDING  Incomplete  MRSA PCR Screening     Status: Abnormal   Collection Time: 07/16/14  4:49 AM  Result Value Ref Range Status   MRSA by PCR POSITIVE (A) NEGATIVE Final    Comment:        The GeneXpert MRSA Assay (FDA approved for NASAL specimens only), is one component of a comprehensive MRSA colonization surveillance program. It is not intended to diagnose MRSA infection nor to guide or monitor treatment for MRSA infections. RESULT CALLED TO, READ BACK BY AND VERIFIED WITH: RN,MIKKI POTTER [id number] @0725  THANEY   Urine culture     Status: None   Collection Time: 07/16/14  6:48 AM  Result Value Ref Range Status   Specimen Description URINE, RANDOM  Final   Special Requests NONE  Final   Colony Count NO GROWTH Performed at [facility]   Final   Culture NO GROWTH Performed at [facility]   Final   Report Status 07/17/2014 FINAL  Final  Clostridium Difficile by PCR     Status: None   Collection Time: 07/17/14 12:07 PM  Result Value Ref Range Status   C difficile by pcr NEGATIVE NEGATIVE Final  Anti-infectives    Start     Dose/Rate Route Frequency Ordered Stop   07/19/14 1300  vancomycin (VANCOCIN) 1,250 mg in sodium chloride 0.9 % 250 mL IVPB     1,250 mg 166.7 mL/hr over 90 Minutes Intravenous Every 12 hours 07/18/14 1205     07/18/14 1400  vancomycin (VANCOCIN) 50 mg/mL oral solution 125 mg     125 mg Oral 4 times per day 07/18/14 1200     07/17/14 2200  ceFEPIme (MAXIPIME) 2 g in dextrose 5 % 50 mL IVPB     2 g 100 mL/hr over 30 Minutes Intravenous Every 12 hours 07/17/14 1415     07/17/14 1000  ceFEPIme (MAXIPIME) 2 g in dextrose 5 % 50 mL IVPB  Status:  Discontinued     2 g 100 mL/hr over 30 Minutes Intravenous Every 24  hours 07/16/14 1121 07/17/14 1415   07/16/14 2359  vancomycin (VANCOCIN) IVPB 750 mg/150 ml premix  Status:  Discontinued     750 mg 150 mL/hr over 60 Minutes Intravenous Every 12 hours 07/16/14 1115 07/18/14 1205   07/16/14 1200  vancomycin (VANCOCIN) 1,500 mg in sodium chloride 0.9 % 500 mL IVPB     1,500 mg 250 mL/hr over 120 Minutes Intravenous  Once 07/16/14 1115 07/16/14 1417   07/16/14 1000  vancomycin (VANCOCIN) 50 mg/mL oral solution 125 mg  Status:  Discontinued     125 mg Oral 4 times daily 07/16/14 0150 07/18/14 1154   07/16/14 0230  ceFEPIme (MAXIPIME) 2 g in dextrose 5 % 50 mL IVPB  Status:  Discontinued     2 g 100 mL/hr over 30 Minutes Intravenous Every 12 hours 07/16/14 0217 07/16/14 1121   07/15/14 2330  levofloxacin (LEVAQUIN) IVPB 750 mg     750 mg 100 mL/hr over 90 Minutes Intravenous  Once 07/15/14 2321 07/16/14 0118      Assessment: 81 y.o. 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 Diff). Per ID note plans for cefepime and vancomycin po indefinitely. S/p Fall 4/30. -Pt currently on indefinite Cefepime therapy for aortic graft infection, followed by ID as outpatient.  Anticoagulation: SQ heparin. Hgb 10. Plts 99>80>77>84. Watch closely.  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 Diff). Per ID note plans for cefepime IV and vancomycin po indefinitely. Pharmacy has been consulted to begin IV Vancomycin for possible bactermia. WBC= 9.3>4.5>3.8, Tmax of 106.7 at his SNF; currently afebrile. ID following as outpt. CrCl up to 67  Vancomycin 5/3>> Cefepime (on PTA; 05/07/14)>>  5/4: Cdiff negative 5/3 urine>>Negative 5/3 blood x2>>pending 5/3 MRSA PCR: Positive   Goal of Therapy:   Vancomycin trough level 15-20 mcg/ml  Plan:  -Vancomycin increase to 1250mg  IV q12h with improved renal function. -Resume Cefepime at 2g IV q12h -Will follow renal function, cultures and clinical progress  Imaad Reuss S. Alford Highland, PharmD, BCPS Clinical Staff Pharmacist Pager [phone]  Eilene Ghazi Stillinger 07/18/2014,12:05 PM

## 2014-07-18 NOTE — Consult Note (Addendum)
   [facility] CM Inpatient Consult   07/18/2014  JOAHAN SWATZELL 1929-08-11 [id number] Follow up on the referral received to assess for care management services. Explained to the patient at bedside,  that [place] [facility] is a covered benefit of health care insurance.   Met with the patient regarding the benefits of [place] Management services. Review information for [facility] Care Management and a brochure was provided with contact information.  Explained that [place] [facility] does not interfere with or replace any services arranged by the inpatient care management staff.  Consent signed previously.  Patient states, "I have everything I need, I just keep getting sick."  Patient denies any problems with appointments, medications, or transportation.  He states he gets his medications delivered to his home.[facility] care management can follow up at discharge and evaluate for home visits.   For questions, contact: Natividad Brood, RN BSN [facility] Liaison  [phone] business mobile phone  Patient had previously consent to telephone follow up.  Will plan to follow up with transition of care calls and evaluate for home visits prior to discharge again. Natividad Brood, RN BSN [facility] Liaison  [phone] business mobile phone

## 2014-07-19 LAB — GLUCOSE, CAPILLARY
GLUCOSE-CAPILLARY: 97 mg/dL (ref 70–99)
Glucose-Capillary: 95 mg/dL (ref 70–99)
Glucose-Capillary: 92 mg/dL (ref 70–99)

## 2014-07-19 MED ORDER — JEVITY 1.2 CAL PO LIQD: 1000.0000 mL | ORAL | Status: DC

## 2014-07-19 MED ORDER — HEPARIN SOD (PORK) LOCK FLUSH 100 UNIT/ML IV SOLN
250.0000 [IU] | INTRAVENOUS | Status: AC | PRN
  Administered 2014-07-19: 250 [IU]

## 2014-07-19 MED ORDER — DEXTROSE 5 % IV SOLN: 2.0000 g | Freq: Two times a day (BID) | INTRAVENOUS | Status: AC

## 2014-07-19 NOTE — Clinical Documentation Improvement (Signed)
Per MD Progress Note 05/05: "Protein calorie malnutrition - g-tube feeds, marinol" Per RD Note 05/03: "Non-severe (moderate) malnutrition in context of chronic illness  INTERVENTION:  . Downgrade diet to Dysphagia 3, thin liquids  . Tube feeding (Resume nocturnal TF regimen at 6 PM today -- infuse Jevity 1.2 formula at 100 ml/hr x 12 hours (from 6 PM to 6 AM) )  . Continue Prostat liquid protein 30 ml TID via tube   Please specify the severity of malnutrition, for example:   --Mild (first degree) --Moderate (second degree) --Severe (third degree) --Unable to suspect or determine --Other, please specify:  Carrolyn Meiers, RN [email or web address] [phone]

## 2014-07-19 NOTE — Progress Notes (Signed)
[name] to be D/C'd Home per MD order.  Discussed with the patient and all questions fully answered.   An After Visit Summary was printed and given to the patient and son .  D/c education completed with patient/family including follow up instructions, medication list, d/c activities limitations if indicated, with other d/c instructions as indicated by MD - patient able to verbalize understanding, all questions fully answered.   Patient instructed to return to ED, call 911, or call MD for any changes in condition.   Patient escorted via [place], and D/C home via private auto.  [name] F 07/19/2014 5:23 PM

## 2014-07-19 NOTE — Progress Notes (Signed)
Pt refused 8am blood sugar check

## 2014-07-19 NOTE — Progress Notes (Addendum)
Nutrition Follow-up  DOCUMENTATION CODES:  Non-severe (moderate) malnutrition in context of chronic illness  INTERVENTION:  Tube feeding (Resume nocturnal TF regimen at 6 PM today -- infuse Jevity 1.2 formula at 100 ml/hr x 12 hours (from 6 PM to 6 AM) )  NUTRITION DIAGNOSIS:  Inadequate oral intake related to dysphagia (poor appetite) as evidenced by per patient/family report.  Ongoing  GOAL:  Patient will meet greater than or equal to 90% of their needs  Met (with TF)  MONITOR:  TF tolerance, PO intake, Weight trends, Labs, I & O's  REASON FOR ASSESSMENT:  Consult Enteral/tube feeding initiation and management  ASSESSMENT: 81 y.o. 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 Patient presents to ED after developing fever at home today. Multiple PO temperature readings of over 99 felt to be inaccurate so they took temperature under arm, under arm reading at home of 106.7.  RD received consult to assess nutritional needs. Noted RD team has been following pt- see RD note on 07/16/14 for further details.  Pt continues to receive nocturnal feedings via PEG- Jevity 1.2 formula at 100 ml/hr x 12 hours (from 6 PM to 6 AM) as well as 30 ml Prostat TID. Complete enteral regimen provides 1740 kcals, 112 gm protein, 968 ml of free water (1796 ml fluid daily including 200 ml free water flush QID ordered) daily (which meets 92% of estimated kcal needs and 100% of estimated protein needs).  Pt also receives a dysphagia 3 diet with thin liquids, however, pt eats extremely little by mouth at baseline. He has been refusing meals this admission. Pt's nutritional needs are met fully by TF. He is tolerating TF well. Noted 0 ml gastric residuals.  Palliative care has been following pt to clarify goals of care. Plan is to discharge home with home health once medically stable. Labs reviewed. CBGS: 95-102.    Height:  Ht Readings from Last 1 Encounters:  07/17/14 _0  (1.88 m)    Weight:  Wt Readings from Last 1 Encounters:  07/19/14 212 lb 4.9 oz (96.3 kg)    Ideal Body Weight:  86.3 kg  Wt Readings from Last 10 Encounters:  07/19/14 212 lb 4.9 oz (96.3 kg)  06/18/14 209 lb (94.802 kg)  05/21/14 209 lb 4 oz (94.915 kg)  05/07/14 206 lb 12.7 oz (93.8 kg)  04/23/14 219 lb 3.2 oz (99.428 kg)  04/14/14 210 lb 1.6 oz (95.3 kg)  03/26/14 223 lb 7 oz (101.35 kg)  03/01/14 216 lb 7.9 oz (98.2 kg)  02/13/14 219 lb 12.8 oz (99.7 kg)  01/11/14 240 lb (108.863 kg)    BMI:  Body mass index is 27.25 kg/(m^2).  Estimated Nutritional Needs:  Kcal:  1900-2100  Protein:  100-110 gm  Fluid:  1.9-2.1 L  Skin:  Reviewed, no issues  Diet Order:  DIET DYS 3 Room service appropriate?: Yes; Fluid consistency:: Thin  EDUCATION NEEDS:  No education needs identified at this time   Intake/Output Summary (Last 24 hours) at 07/19/14 1427 Last data filed at 07/19/14 1347  Gross per 24 hour  Intake    760 ml  Output   1460 ml  Net   -700 ml    Last BM:  07/18/14  Amy Belloso A. Jimmye Norman, RD, LDN, CDE Pager: [phone] After hours Pager: [phone]

## 2014-07-19 NOTE — Progress Notes (Signed)
Patient will be going home with iv abx,  Miranda with [facility] notified.  MD wll print out script for abx.  Patient will also have hhpt with hhrn resumed with [facility].

## 2014-07-19 NOTE — Progress Notes (Addendum)
INFECTIOUS DISEASE PROGRESS NOTE  ID: [name] is a 81 y.o. 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   Bacteremia due to Gram-negative bacteria   Pain   Diarrhea   Acute kidney injury  Subjective: No complaints, needs to use comode  Abtx:  Anti-infectives    Start     Dose/Rate Route Frequency Ordered Stop   07/19/14 1300  vancomycin (VANCOCIN) 1,250 mg in sodium chloride 0.9 % 250 mL IVPB     1,250 mg 166.7 mL/hr over 90 Minutes Intravenous Every 12 hours 07/18/14 1205     07/18/14 1400  vancomycin (VANCOCIN) 50 mg/mL oral solution 125 mg     125 mg Oral 4 times per day 07/18/14 1200     07/17/14 2200  ceFEPIme (MAXIPIME) 2 g in dextrose 5 % 50 mL IVPB     2 g 100 mL/hr over 30 Minutes Intravenous Every 12 hours 07/17/14 1415     07/17/14 1000  ceFEPIme (MAXIPIME) 2 g in dextrose 5 % 50 mL IVPB  Status:  Discontinued     2 g 100 mL/hr over 30 Minutes Intravenous Every 24 hours 07/16/14 1121 07/17/14 1415   07/16/14 2359  vancomycin (VANCOCIN) IVPB 750 mg/150 ml premix  Status:  Discontinued     750 mg 150 mL/hr over 60 Minutes Intravenous Every 12 hours 07/16/14 1115 07/18/14 1205   07/16/14 1200  vancomycin (VANCOCIN) 1,500 mg in sodium chloride 0.9 % 500 mL IVPB     1,500 mg 250 mL/hr over 120 Minutes Intravenous  Once 07/16/14 1115 07/16/14 1417   07/16/14 1000  vancomycin (VANCOCIN) 50 mg/mL oral solution 125 mg  Status:  Discontinued     125 mg Oral 4 times daily 07/16/14 0150 07/18/14 1154   07/16/14 0230  ceFEPIme (MAXIPIME) 2 g in dextrose 5 % 50 mL IVPB  Status:  Discontinued     2 g 100 mL/hr over 30 Minutes Intravenous Every 12 hours 07/16/14 0217 07/16/14 1121   07/15/14 2330  levofloxacin (LEVAQUIN) IVPB 750 mg     750 mg 100 mL/hr over 90 Minutes Intravenous  Once 07/15/14 2321 07/16/14 0118      Medications:    Scheduled: . aspirin EC  81 mg Oral Daily  . ceFEPIme (MAXIPIME) 2 GM IVP  2 g Intravenous Q12H  . Chlorhexidine Gluconate Cloth  6 each Topical Q0600  . dronabinol  2.5 mg Oral TID  . feeding supplement (JEVITY 1.2 CAL)  1,000 mL Per Tube Q24H  . feeding supplement (PRO-STAT SUGAR FREE 64)  30 mL Per Tube TID  . fesoterodine  4 mg Oral Daily  . free water  200 mL Per Tube QID  . heparin  5,000 Units Subcutaneous 3 times per day  . magnesium oxide  200 mg Oral Daily  . mupirocin ointment   Nasal BID  . simvastatin  20 mg Oral Daily  . sodium chloride  3 mL Intravenous Q12H  . vancomycin  1,250 mg Intravenous Q12H  . vancomycin  125 mg Oral 4 times per day    Objective: Vital signs in last 24 hours: Temp:  [98.1 F (36.7 C)-98.2 F (36.8 C)] 98.1 F (36.7 C) (05/06 1406) Pulse Rate:  [63-73] 66 (05/06 1406) Resp:  [16-18] 16 (05/06 1406) BP: (127-163)/(76-83) 147/83 mmHg (05/06 1406) SpO2:  [97 %-  98 %] 98 % (05/06 1406) Weight:  [96.3 kg (212 lb 4.9 oz)] 96.3 kg (212 lb 4.9 oz) (05/06 0506)   General appearance: alert, cooperative and no distress Resp: clear to auscultation bilaterally Cardio: regular rate and rhythm GI: normal findings: bowel sounds normal and soft, non-tender  PIC site clean, non-tender.   Lab Results  Recent Labs  07/17/14 0606 07/18/14 0605  WBC 4.5 3.8*  HGB 9.6* 10.0*  HCT 29.4* 31.3*  NA 137 138  K 3.8 3.9  CL 106 107  CO2 23 25  BUN 36* 34*  CREATININE 1.12 0.98   Liver Panel No results for input(s): PROT, ALBUMIN, AST, ALT, ALKPHOS, BILITOT, BILIDIR, IBILI in the last 72 hours. Sedimentation Rate No results for input(s): ESRSEDRATE in the last 72 hours. C-Reactive Protein No results for input(s): CRP in the last 72 hours.  Microbiology: Recent Results (from the past 240 hour(s))  Culture, blood (routine x 2)     Status: None (Preliminary result)   Collection Time: 07/15/14 11:08 PM  Result Value Ref Range Status   Specimen  Description BLOOD RIGHT ARM  Final   Special Requests BOTTLES DRAWN AEROBIC AND ANAEROBIC 5CC  Final   Culture   Final           BLOOD CULTURE RECEIVED NO GROWTH TO DATE CULTURE WILL BE HELD FOR 5 DAYS BEFORE ISSUING A FINAL NEGATIVE REPORT Performed at [facility]    Report Status PENDING  Incomplete  Culture, blood (routine x 2)     Status: None (Preliminary result)   Collection Time: 07/16/14 12:22 AM  Result Value Ref Range Status   Specimen Description BLOOD RIGHT ARM  Final   Special Requests BOTTLES DRAWN AEROBIC AND ANAEROBIC 5CC  Final   Culture   Final           BLOOD CULTURE RECEIVED NO GROWTH TO DATE CULTURE WILL BE HELD FOR 5 DAYS BEFORE ISSUING A FINAL NEGATIVE REPORT Performed at [facility]    Report Status PENDING  Incomplete  MRSA PCR Screening     Status: Abnormal   Collection Time: 07/16/14  4:49 AM  Result Value Ref Range Status   MRSA by PCR POSITIVE (A) NEGATIVE Final    Comment:        The GeneXpert MRSA Assay (FDA approved for NASAL specimens only), is one component of a comprehensive MRSA colonization surveillance program. It is not intended to diagnose MRSA infection nor to guide or monitor treatment for MRSA infections. RESULT CALLED TO, READ BACK BY AND VERIFIED WITH: RN,MIKKI POTTER [id number] @0725  THANEY   Urine culture     Status: None   Collection Time: 07/16/14  6:48 AM  Result Value Ref Range Status   Specimen Description URINE, RANDOM  Final   Special Requests NONE  Final   Colony Count NO GROWTH Performed at [facility]   Final   Culture NO GROWTH Performed at [facility]   Final   Report Status 07/17/2014 FINAL  Final  Clostridium Difficile by PCR     Status: None   Collection Time: 07/17/14 12:07 PM  Result Value Ref Range Status   C difficile by pcr NEGATIVE NEGATIVE Final    Studies/Results: No results found.   Assessment/Plan: Fever (temp 101.6 per daughter, not 106!) AAA infection  (pseudomonas, proteus) Protein calorie malnutrition- g-tube feeds, marinol Prev c diff   Total days of antibiotics: day 3 vanco IV and PO Cefepime (05-07-14)  My great appreciation to Dr Deitra Mayo and [facility] No documented fever in hospital.  BCx (5-2)- so far ngtd stop IV vanco.  C diff is - on 5-4, will remain on po vancodue to prolonged anbx and hx of C diff.  Consider megace? Nutrition eval? Appreciate PT f/u Agree with d/c planning Has ID f/u appt 5-17         Bobby Rumpf Infectious Diseases (pager) [phone] [email or web address] 07/19/2014, 2:48 PM  LOS: 3 days

## 2014-07-19 NOTE — Discharge Instructions (Signed)
Sepsis °Sepsis is a serious infection of your blood or tissues that affects your whole body. The infection that causes sepsis may be bacterial, viral, fungal, or parasitic. Sepsis may be life threatening. Sepsis can cause your blood pressure to drop. This may result in shock. Shock causes your central nervous system and your organs to stop working correctly.  °RISK FACTORS °Sepsis can happen in anyone, but it is more likely to happen in people who have weakened immune systems. °SIGNS AND SYMPTOMS  °Symptoms of sepsis can include: °· Fever or low body temperature (hypothermia). °· Rapid breathing (hyperventilation). °· Chills. °· Rapid heartbeat (tachycardia). °· Confusion or light-headedness. °· Trouble breathing. °· Urinating much less than usual. °· Cool, clammy skin or red, flushed skin. °· Other problems with the heart, kidneys, or brain. °DIAGNOSIS  °Your health care provider will likely do tests to look for an infection, to see if the infection has spread to your blood, and to see how serious your condition is. Tests can include: °· Blood tests, including cultures of your blood. °· Cultures of other fluids from your body, such as: °¨ Urine. °¨ Pus from wounds. °¨ Mucus coughed up from your lungs. °· Urine tests other than cultures. °· X-ray exams or other imaging tests. °TREATMENT  °Treatment will begin with elimination of the source of infection. If your sepsis is likely caused by a bacterial or fungal infection, you will be given antibiotic or antifungal medicines. °You may also receive: °· Oxygen. °· Fluids through an IV tube. °· Medicines to increase your blood pressure. °· A machine to clean your blood (dialysis) if your kidneys fail. °· A machine to help you breathe if your lungs fail. °SEEK IMMEDIATE MEDICAL CARE IF: °You get an infection or develop any of the signs and symptoms of sepsis after surgery or a hospitalization. °Document Released: 11/28/2002 Document Revised: 03/06/2013 Document Reviewed:  11/06/2012 °ExitCare® Patient Information ©2015 [employer]. This information is not intended to replace advice given to you by your health care provider. Make sure you discuss any questions you have with your health care provider. ° °

## 2014-07-19 NOTE — Progress Notes (Signed)
Physical Therapy Treatment Patient Details Name: [name] MRN: [id number] DOB: 1929/04/24 Today's Date: 07/19/2014    History of Present Illness 81 y.o. 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 difficile colitis controlled on oral vancomycin, presented to ED with complaints of fever ( Multiple PO temperature readings of over 99 felt to be inaccurate so they took temperature under arm, under arm reading at home of 106.7). Patient has no other complaints other than mid back pain following a fall last week which has persisted. Other PMH: HTN, HLD, DVT s/p IVC filter, G-tube feeds. Admitted for infection of aortic graft. Infectious disease was consulted. Met sepsis criteria on arrival (blood pressure 87/73, pulse 125, RR 38) and potential source.    PT Comments    Patient with improved mobility and gait.  Follow Up Recommendations  Home health PT;Supervision/Assistance - 24 hour     Equipment Recommendations  3in1 (PT)    Recommendations for Other Services       Precautions / Restrictions Precautions Precautions: Fall Precaution Comments: G-tube Restrictions Weight Bearing Restrictions: No    Mobility  Bed Mobility Overal bed mobility: Needs Assistance Bed Mobility: Supine to Sit;Sit to Supine     Supine to sit: Mod assist Sit to supine: Min assist   General bed mobility comments: Min assist to bring trunk to upright sitting position.  Good balance once upright.  Assist to bring LE's on to bed to return to supine.  Transfers Overall transfer level: Needs assistance Equipment used: Rolling walker (2 wheeled) Transfers: Sit to/from Stand Sit to Stand: Min assist         General transfer comment: min power-up  assist  Ambulation/Gait Ambulation/Gait assistance: Min guard Ambulation Distance (Feet): 75 Feet Assistive device: Rolling walker (2 wheeled) Gait Pattern/deviations: Step-through pattern;Decreased stride length Gait velocity: Decreased Gait velocity interpretation: Below normal speed for age/gender General Gait Details: Verbal cues to stand upright and stay close to RW.  Good balance with RW.   Stairs            Wheelchair Mobility    Modified Rankin (Stroke Patients Only)       Balance                                    Cognition Arousal/Alertness: Awake/alert Behavior During Therapy: WFL for tasks assessed/performed Overall Cognitive Status: Within Functional Limits for tasks assessed                      Exercises      General Comments        Pertinent Vitals/Pain Pain Assessment: No/denies pain    Home Living                      Prior Function            PT Goals (current goals can now be found in the care plan section) Progress towards PT goals: Progressing toward goals    Frequency  Min 3X/week    PT Plan Current plan remains appropriate    Co-evaluation             End of Session Equipment Utilized During Treatment: Gait belt Activity Tolerance: Patient  tolerated treatment well Patient left: in bed;with call bell/phone within reach     Time: 1208-1220 PT Time Calculation (min) (ACUTE ONLY): 12 min  Charges:  $Gait Training: 8-22 mins                    G Codes:      Despina Pole 2014-08-07, 1:39 PM Carita Pian. Sanjuana Kava, [place] Pager [phone]

## 2014-07-19 NOTE — Discharge Summary (Addendum)
Physician Discharge Summary  [name] [id number] DOB: 01/22/1930 DOA: 07/15/2014  PCP: [name], MD  Admit date: 07/15/2014 Discharge date: 07/19/2014  Time spent: Greater than 30 minutes  Recommendations for Outpatient Follow-up:  1. Dr. [name], PCP in 5-7 days. Please follow final blood culture results that were sent from the hospital. 2. Dr. Michel Bickers, Infectious Disease on 07/30/14 3. Home health PT and RN. RN to draw weekly labs (CBC & BMP) and forward results to Dr. Michel Bickers, ID and Dr. [name], PCP 4. DME 3 n 1  Discharge Diagnoses:  Active Problems:   Sepsis   Bacterial infection due to Proteus mirabilis   Hx of Clostridium difficile infection   S/P AAA repair 2014 with chronic graft infection   Bacteremia due to Gram-negative bacteria   Pain   Diarrhea   Acute kidney injury   Discharge Condition: Improved & Stable  Diet recommendation: Dysphagia 3 diet and thin liquids. Ongoing tube feeds.  Filed Weights   07/17/14 0530 07/18/14 0455 07/19/14 0506  Weight: 95.8 kg (211 lb 3.2 oz) 97 kg (213 lb 13.5 oz) 96.3 kg (212 lb 4.9 oz)    History of present illness:  81 y.o. 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 difficile colitis controlled on oral vancomycin, presented to ED with complaints of fever ( Multiple PO temperature readings of over 99 felt to be inaccurate so they took temperature under arm, under arm reading at home of 101.7). Patient has no other complaints other than mid back pain following a fall last week which has persisted. Other PMH: HTN, HLD, DVT s/p IVC filter, G-tube feeds. Admitted for infection of aortic graft. Infectious disease was consulted. Met sepsis criteria on arrival (blood pressure  87/73, pulse 125, RR 38) and potential source.  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, now with worsening & sepsis because of graft complication  - Followed by infectious disease as outpatient - Not curable with antibiotic treatment - Lifelong suppressive therapy - On IV cefepime via left upper arm PICC line at home (PO quinolones considered but deferred due to risk of prolonged QT and arrhythmias) - Blood culture results are negative to date - Infectious disease consulted and recommended continuing IV cefepime and added vancomycin - Chest x-ray without acute findings - Urine microscopy: Not indicative of UTI - No change in antibiotics for now - Infectious disease has seen patient today. Recommend discontinuing vancomycin and DC patient home on previous IV cefepime and oral vancomycin. Patient has follow-up appointment with infectious disease. - Palliative care was consulted and patient wished to continue aggressive treatment.  C. difficile colitis - Continue PO vancomycin indefinitely - Patient had some episodes of diarrhea on 5/4-self-limited. - C. difficile PCR: Negative  Essential hypertension - Controlled off medications. Monitor  Hyperlipidemia - Continue statins  PEG tube feeding - As per family, patient only drinks Gatorade, water and takes pills by mouth and gets tube feeding at night via G-tube - Dietitian consulted to resume tube feeds.  Acute kidney injury - May be related to ongoing infection - Brief IV fluids and resolved  Chronic anemia/pancytopenia - Hemoglobin probably at baseline. Follow CBC - Gradual drop to 9.6.? Dilutional versus acute illness. - Stable - Mild leukopenia-outpatient CBC follow-up.  Thrombocytopenia -  Chronic and intermittent - Now possibly related to infection and? Antibiotics - Stable  Hard of hearing  History of DVT, s/p IVC filter  Low back pain - No acute findings on  physical exam - X-rays without fracture - Has not complained of significant back pain over the last 2 days.  Non-severe (moderate) malnutrition in context of chronic illness  - Management per dietitian recommendations   Consultants:  ID  Palliative care team  Procedures:  Has chronic left upper extremity PICC line  Has chronic PEG tube  Antibiotics:  IV Cefepime 5/3>  PO Vancomycin 5/3>   IV vancomycin 5/3 >    Discharge Exam:  Complaints: Denies complaints and is anxious to go home.   Filed Vitals:   07/18/14 2144 07/19/14 0506 07/19/14 0506 07/19/14 1406  BP: 127/76 151/77  147/83  Pulse: 63 71 73 66  Temp: 98.2 F (36.8 C)  98.1 F (36.7 C) 98.1 F (36.7 C)  TempSrc: Oral  Oral Oral  Resp: 18 18  16   Height:      Weight:   96.3 kg (212 lb 4.9 oz)   SpO2: 98% 97% 98% 98%    General exam: Pleasant elderly male, quite hard of hearing, lying comfortably supine in bed.  Respiratory system: Clear. No increased work of breathing. Cardiovascular system: S1 & S2 heard, RRR. No JVD, murmurs, gallops, clicks or pedal edema.  Gastrointestinal system: Abdomen is nondistended, soft and nontender. Normal bowel sounds heard. PEG tube site intact Central nervous system: Alert and oriented. No focal neurological deficits. Extremities: Symmetric 5 x 5 power. Left upper extremity PICC site without acute infection Musculoskeletal system: No acute findings on back exam  Discharge Instructions      Discharge Instructions    Call MD for:  difficulty breathing, headache or visual disturbances    Complete by:  As directed      Call MD for:  extreme fatigue    Complete by:  As directed      Call MD for:  persistant dizziness or light-headedness    Complete by:  As directed      Call MD for:  persistant nausea and vomiting    Complete by:  As directed      Call MD for:  redness, tenderness, or signs of infection (pain, swelling, redness, odor or green/yellow discharge  around incision site)    Complete by:  As directed      Call MD for:  severe uncontrolled pain    Complete by:  As directed      Call MD for:  temperature >100.4    Complete by:  As directed      Discharge instructions    Complete by:  As directed   1) DIET: Dysphagia 3 diet and thin liquids. 2) continue tube feeding as per prior to admission regimen.     Increase activity slowly    Complete by:  As directed             Medication List    TAKE these medications        acetaminophen 500 MG tablet  Commonly known as:  TYLENOL  Take 1,000 mg by mouth every 6 (six) hours as needed for mild pain or moderate pain.     aspirin EC 81 MG tablet  Take 81 mg by mouth daily.     bisacodyl 10 MG suppository  Commonly known as:  DULCOLAX  Place 1 suppository (10 mg total) rectally as needed for  mild constipation or moderate constipation.     ceFEPIme 2 g in dextrose 5 % 50 mL  Inject 2 g into the vein every 12 (twelve) hours.     dronabinol 2.5 MG capsule  Commonly known as:  MARINOL  Take 1 capsule (2.5 mg total) by mouth 3 (three) times daily.     feeding supplement (JEVITY 1.2 CAL) Liqd  Place 1,000 mLs into feeding tube daily. Run for 12 hours out of each 24     feeding supplement (PRO-STAT SUGAR FREE 64) Liqd  Take 30 mLs by mouth 3 (three) times daily with meals.     free water Soln  Place 200 mLs into feeding tube 4 (four) times daily.     magnesium oxide 400 (241.3 MG) MG tablet  Commonly known as:  MAG-OX  Take 0.5 tablets (200 mg total) by mouth daily.     MULTI-VITAMIN PO  Take 1 tablet by mouth daily.     simvastatin 20 MG tablet  Commonly known as:  ZOCOR  Take 1 tablet (20 mg total) by mouth daily.     tolterodine 4 MG 24 hr capsule  Commonly known as:  DETROL LA  Take 4 mg by mouth 2 (two) times daily.     vancomycin 50 mg/mL oral solution  Commonly known as:  VANCOCIN  Take 2.5 mLs (125 mg total) by mouth 4 (four) times daily.       Follow-up  Information    Schedule an appointment as soon as possible for a visit with [name], MD.   Specialty:  Internal Medicine   Why:  to be seen in 5-7 days. Please follow labs (CBC & BMP) to be drawn by Home Health RN on weekly basis.   Contact information:   [address], [place] [address] [phone]        The results of significant diagnostics from this hospitalization (including imaging, microbiology, ancillary and laboratory) are listed below for reference.    Significant Diagnostic Studies: Dg Chest 1 View  07/16/2014   CLINICAL DATA:  Upper to mid back pain after fall on Friday.  EXAM: CHEST  1 VIEW  COMPARISON:  05/08/2014  FINDINGS: There is an endoluminal descending thoracic aortic stent graft. There are unchanged moderately prominent aortic contours. There is a left upper extremity PICC line extending into the SVC. There is unchanged left hemidiaphragm elevation. There is mild interstitial coarsening and mild linear left base scarring, unchanged.  IMPRESSION: No acute cardiopulmonary findings.   Electronically Signed   By: Andreas Newport M.D.   On: 07/16/2014 00:27   Dg Thoracic Spine W/swimmers  07/16/2014   CLINICAL DATA:  Upper mid back pain after fall on Friday  EXAM: THORACIC SPINE - 2 VIEW + SWIMMERS  COMPARISON:  None.  FINDINGS: There is no evidence of acute thoracic spine fracture. There is moderate kyphosis. There is moderately severe degenerative disc change at multiple levels. There is no bone lesion or bony destruction. The endoluminal aortic stent graft is again noted, without change and aortic contour.  IMPRESSION: Negative for acute thoracic spine fracture. Moderate kyphosis and degenerative change.   Electronically Signed   By: Andreas Newport M.D.   On: 07/16/2014 00:28    Microbiology: Recent Results (from the past 240 hour(s))  Culture, blood (routine x 2)     Status: None (Preliminary result)   Collection Time: 07/15/14 11:08 PM   Result Value Ref Range Status   Specimen Description BLOOD RIGHT  ARM  Final   Special Requests BOTTLES DRAWN AEROBIC AND ANAEROBIC 5CC  Final   Culture   Final           BLOOD CULTURE RECEIVED NO GROWTH TO DATE CULTURE WILL BE HELD FOR 5 DAYS BEFORE ISSUING A FINAL NEGATIVE REPORT Performed at [facility]    Report Status PENDING  Incomplete  Culture, blood (routine x 2)     Status: None (Preliminary result)   Collection Time: 07/16/14 12:22 AM  Result Value Ref Range Status   Specimen Description BLOOD RIGHT ARM  Final   Special Requests BOTTLES DRAWN AEROBIC AND ANAEROBIC 5CC  Final   Culture   Final           BLOOD CULTURE RECEIVED NO GROWTH TO DATE CULTURE WILL BE HELD FOR 5 DAYS BEFORE ISSUING A FINAL NEGATIVE REPORT Performed at [facility]    Report Status PENDING  Incomplete  MRSA PCR Screening     Status: Abnormal   Collection Time: 07/16/14  4:49 AM  Result Value Ref Range Status   MRSA by PCR POSITIVE (A) NEGATIVE Final    Comment:        The GeneXpert MRSA Assay (FDA approved for NASAL specimens only), is one component of a comprehensive MRSA colonization surveillance program. It is not intended to diagnose MRSA infection nor to guide or monitor treatment for MRSA infections. RESULT CALLED TO, READ BACK BY AND VERIFIED WITH: RN,MIKKI POTTER [id number] $[email or web address]$  THANEY   Urine culture     Status: None   Collection Time: 07/16/14  6:48 AM  Result Value Ref Range Status   Specimen Description URINE, RANDOM  Final   Special Requests NONE  Final   Colony Count NO GROWTH Performed at [facility]   Final   Culture NO GROWTH Performed at [facility]   Final   Report Status 07/17/2014 FINAL  Final  Clostridium Difficile by PCR     Status: None   Collection Time: 07/17/14 12:07 PM  Result Value Ref Range Status   C difficile by pcr NEGATIVE NEGATIVE Final     Labs: Basic Metabolic Panel:  Recent Labs Lab 07/15/14 2259  07/16/14 0329 07/17/14 0606 07/18/14 0605  NA 137 137 137 138  K 3.6 4.1 3.8 3.9  CL 105 106 106 107  CO2 21* 20* 23 25  GLUCOSE 128* 118* 107* 93  BUN 39* 38* 36* 34*  CREATININE 1.29* 1.27* 1.12 0.98  CALCIUM 9.8 9.1 8.8* 8.9   Liver Function Tests:  Recent Labs Lab 07/15/14 2259  AST 32  ALT 21  ALKPHOS 80  BILITOT 1.0  PROT 7.6  ALBUMIN 2.7*   No results for input(s): LIPASE, AMYLASE in the last 168 hours. No results for input(s): AMMONIA in the last 168 hours. CBC:  Recent Labs Lab 07/15/14 2259 07/16/14 0329 07/17/14 0606 07/18/14 0605  WBC 8.5 9.3 4.5 3.8*  NEUTROABS 7.5  --   --   --   HGB 12.8* 10.6* 9.6* 10.0*  HCT 39.6 32.2* 29.4* 31.3*  MCV 88.2 86.8 87.2 87.2  PLT 99* 80* 77* 84*   Cardiac Enzymes: No results for input(s): CKTOTAL, CKMB, CKMBINDEX, TROPONINI in the last 168 hours. BNP: BNP (last 3 results)  Recent Labs  04/09/14 1918  BNP 1434.7*    ProBNP (last 3 results) No results for input(s): PROBNP in the last 8760 hours.  CBG:  Recent Labs Lab 07/18/14 1226 07/18/14 1720 07/18/14 1959 07/19/14  0050 07/19/14 1138  GLUCAP 102* 88 101* 97 95      Signed:  Vernell Leep, MD, FACP, FHM. [facility] Pager [phone]  If 7PM-7AM, please contact night-coverage [email or web address] Password [email or web address] 07/19/2014, 3:47 PM

## 2014-07-19 NOTE — Care Management Note (Signed)
Case Management Note  Patient Details  Name: [name] MRN: [id number] Date of Birth: September 06, 1929  Subjective/Objective:          Patient will resume services with [facility] for [facility] for iv abx and HHPT.  Miranda notified with [facility].  Patient's daughter will be transporting him home today.          Action/Plan:   Expected Discharge Date:  07/19/14               Expected Discharge Plan:  [place]  In-House Referral:     Discharge planning Services  CM Consult  Post Acute Care Choice:  Resumption of Svcs/PTA Provider Choice offered to:  Patient  DME Arranged:    DME Agency:     HH Arranged:    [place] Agency:     Status of Service:  Completed, signed off  Medicare Important Message Given:  Yes Date Medicare IM Given:  07/19/14 Medicare IM give by:  Tomi Bamberger RN Date Additional Medicare IM Given:    Additional Medicare Important Message give by:     If discussed at [place] of Stay Meetings, dates discussed:    Additional Comments:  Zenon Mayo, RN 07/19/2014, 2:48 PM

## 2014-07-22 LAB — CULTURE, BLOOD (ROUTINE X 2)
Culture: NO GROWTH
Culture: NO GROWTH

## 2014-07-24 ENCOUNTER — Telehealth: Payer: Self-pay | Admitting: *Deleted

## 2014-07-24 NOTE — Telephone Encounter (Addendum)
Daughter feels pt is "much more fatigued and weaker" after d/c from hospital on 07/19/14.  No fevers since discharge.  Blood pressure slightly higher than normal.  HH PT came out yesterday and found that the pt was too weak to participate in PT.  PT was going to return today for a session.  Daughter is fearful of possible fall at home.  Wondering if ID MD needs to evaluate the pt.  RN asked whether the pt has seen his PCP since discharge.  Pt has an appt at PCP office for lab work on Friday, May 13.  RN advised pt's daughter to call the PCP office to ask for an appt w/ MD on Friday when pt comes for Lab work.  Daughter agreed to call PCP's office to request an appt for Friday, May 13th. Daughter called back and left a message.  She is still very concerned about her father.  She did not mention whether she had contacted her father's PCP for an appointment.   Unable to speak with Ms Sheryn Bison when call was returned.  Phone went to voice mail.  RN advised seeing pt's PCP or going to the [facility] ED for evaluation if PCP appt is not available.

## 2014-07-25 ENCOUNTER — Encounter (HOSPITAL_COMMUNITY): Payer: Self-pay | Admitting: Cardiology

## 2014-07-25 ENCOUNTER — Emergency Department (HOSPITAL_COMMUNITY): Payer: Medicare Other

## 2014-07-25 ENCOUNTER — Inpatient Hospital Stay (HOSPITAL_COMMUNITY): Payer: Medicare Other

## 2014-07-25 ENCOUNTER — Other Ambulatory Visit (HOSPITAL_COMMUNITY): Payer: Self-pay

## 2014-07-25 ENCOUNTER — Inpatient Hospital Stay (HOSPITAL_COMMUNITY)
Admission: EM | Admit: 2014-07-25 | Discharge: 2014-08-01 | DRG: 065 | Disposition: A | Discharge status: Skilled Nursing Facility | Payer: Medicare Other | Attending: Internal Medicine | Admitting: Internal Medicine

## 2014-07-25 DIAGNOSIS — N400 Enlarged prostate without lower urinary tract symptoms: Secondary | ICD-10-CM | POA: Diagnosis present

## 2014-07-25 DIAGNOSIS — A047 Enterocolitis due to Clostridium difficile: Secondary | ICD-10-CM | POA: Diagnosis present

## 2014-07-25 DIAGNOSIS — R05 Cough: Secondary | ICD-10-CM

## 2014-07-25 DIAGNOSIS — G8191 Hemiplegia, unspecified affecting right dominant side: Secondary | ICD-10-CM | POA: Diagnosis present

## 2014-07-25 DIAGNOSIS — Y832 Surgical operation with anastomosis, bypass or graft as the cause of abnormal reaction of the patient, or of later complication, without mention of misadventure at the time of the procedure: Secondary | ICD-10-CM | POA: Diagnosis present

## 2014-07-25 DIAGNOSIS — Z931 Gastrostomy status: Secondary | ICD-10-CM | POA: Diagnosis not present

## 2014-07-25 DIAGNOSIS — Z452 Encounter for adjustment and management of vascular access device: Secondary | ICD-10-CM

## 2014-07-25 DIAGNOSIS — Z87891 Personal history of nicotine dependence: Secondary | ICD-10-CM

## 2014-07-25 DIAGNOSIS — S065XAA Traumatic subdural hemorrhage with loss of consciousness status unknown, initial encounter: Secondary | ICD-10-CM

## 2014-07-25 DIAGNOSIS — B965 Pseudomonas (aeruginosa) (mallei) (pseudomallei) as the cause of diseases classified elsewhere: Secondary | ICD-10-CM | POA: Diagnosis present

## 2014-07-25 DIAGNOSIS — D696 Thrombocytopenia, unspecified: Secondary | ICD-10-CM | POA: Diagnosis present

## 2014-07-25 DIAGNOSIS — I6200 Nontraumatic subdural hemorrhage, unspecified: Secondary | ICD-10-CM | POA: Diagnosis not present

## 2014-07-25 DIAGNOSIS — I609 Nontraumatic subarachnoid hemorrhage, unspecified: Secondary | ICD-10-CM

## 2014-07-25 DIAGNOSIS — Z8719 Personal history of other diseases of the digestive system: Secondary | ICD-10-CM | POA: Diagnosis not present

## 2014-07-25 DIAGNOSIS — R29898 Other symptoms and signs involving the musculoskeletal system: Secondary | ICD-10-CM

## 2014-07-25 DIAGNOSIS — Z8673 Personal history of transient ischemic attack (TIA), and cerebral infarction without residual deficits: Secondary | ICD-10-CM | POA: Diagnosis not present

## 2014-07-25 DIAGNOSIS — Z96653 Presence of artificial knee joint, bilateral: Secondary | ICD-10-CM | POA: Diagnosis present

## 2014-07-25 DIAGNOSIS — Z86718 Personal history of other venous thrombosis and embolism: Secondary | ICD-10-CM

## 2014-07-25 DIAGNOSIS — M6289 Other specified disorders of muscle: Secondary | ICD-10-CM

## 2014-07-25 DIAGNOSIS — R1310 Dysphagia, unspecified: Secondary | ICD-10-CM | POA: Diagnosis present

## 2014-07-25 DIAGNOSIS — Z792 Long term (current) use of antibiotics: Secondary | ICD-10-CM | POA: Diagnosis not present

## 2014-07-25 DIAGNOSIS — R627 Adult failure to thrive: Secondary | ICD-10-CM | POA: Diagnosis present

## 2014-07-25 DIAGNOSIS — A0471 Enterocolitis due to Clostridium difficile, recurrent: Secondary | ICD-10-CM

## 2014-07-25 DIAGNOSIS — I429 Cardiomyopathy, unspecified: Secondary | ICD-10-CM | POA: Diagnosis present

## 2014-07-25 DIAGNOSIS — Z9889 Other specified postprocedural states: Secondary | ICD-10-CM

## 2014-07-25 DIAGNOSIS — E785 Hyperlipidemia, unspecified: Secondary | ICD-10-CM | POA: Diagnosis present

## 2014-07-25 DIAGNOSIS — Z7982 Long term (current) use of aspirin: Secondary | ICD-10-CM | POA: Diagnosis not present

## 2014-07-25 DIAGNOSIS — R059 Cough, unspecified: Secondary | ICD-10-CM

## 2014-07-25 DIAGNOSIS — Z8679 Personal history of other diseases of the circulatory system: Secondary | ICD-10-CM | POA: Diagnosis not present

## 2014-07-25 DIAGNOSIS — N183 Chronic kidney disease, stage 3 unspecified: Secondary | ICD-10-CM | POA: Diagnosis present

## 2014-07-25 DIAGNOSIS — S065X9A Traumatic subdural hemorrhage with loss of consciousness of unspecified duration, initial encounter: Secondary | ICD-10-CM | POA: Diagnosis present

## 2014-07-25 DIAGNOSIS — I129 Hypertensive chronic kidney disease with stage 1 through stage 4 chronic kidney disease, or unspecified chronic kidney disease: Secondary | ICD-10-CM | POA: Diagnosis present

## 2014-07-25 DIAGNOSIS — S066X9A Traumatic subarachnoid hemorrhage with loss of consciousness of unspecified duration, initial encounter: Secondary | ICD-10-CM | POA: Diagnosis not present

## 2014-07-25 DIAGNOSIS — Z880 Allergy status to penicillin: Secondary | ICD-10-CM | POA: Diagnosis not present

## 2014-07-25 DIAGNOSIS — I10 Essential (primary) hypertension: Secondary | ICD-10-CM | POA: Diagnosis present

## 2014-07-25 DIAGNOSIS — E43 Unspecified severe protein-calorie malnutrition: Secondary | ICD-10-CM

## 2014-07-25 DIAGNOSIS — B964 Proteus (mirabilis) (morganii) as the cause of diseases classified elsewhere: Secondary | ICD-10-CM | POA: Diagnosis not present

## 2014-07-25 DIAGNOSIS — Z6827 Body mass index (BMI) 27.0-27.9, adult: Secondary | ICD-10-CM | POA: Diagnosis not present

## 2014-07-25 DIAGNOSIS — R7881 Bacteremia: Secondary | ICD-10-CM | POA: Diagnosis present

## 2014-07-25 DIAGNOSIS — I359 Nonrheumatic aortic valve disorder, unspecified: Secondary | ICD-10-CM | POA: Diagnosis present

## 2014-07-25 DIAGNOSIS — R531 Weakness: Secondary | ICD-10-CM | POA: Diagnosis present

## 2014-07-25 DIAGNOSIS — Z79899 Other long term (current) drug therapy: Secondary | ICD-10-CM

## 2014-07-25 DIAGNOSIS — T827XXA Infection and inflammatory reaction due to other cardiac and vascular devices, implants and grafts, initial encounter: Secondary | ICD-10-CM | POA: Diagnosis present

## 2014-07-25 DIAGNOSIS — Z888 Allergy status to other drugs, medicaments and biological substances status: Secondary | ICD-10-CM

## 2014-07-25 DIAGNOSIS — H9190 Unspecified hearing loss, unspecified ear: Secondary | ICD-10-CM | POA: Diagnosis present

## 2014-07-25 DIAGNOSIS — E440 Moderate protein-calorie malnutrition: Secondary | ICD-10-CM | POA: Diagnosis present

## 2014-07-25 DIAGNOSIS — T827XXD Infection and inflammatory reaction due to other cardiac and vascular devices, implants and grafts, subsequent encounter: Secondary | ICD-10-CM | POA: Diagnosis not present

## 2014-07-25 DIAGNOSIS — S065X0A Traumatic subdural hemorrhage without loss of consciousness, initial encounter: Secondary | ICD-10-CM | POA: Diagnosis not present

## 2014-07-25 LAB — URINE MICROSCOPIC-ADD ON

## 2014-07-25 LAB — URINALYSIS, ROUTINE W REFLEX MICROSCOPIC
BILIRUBIN URINE: NEGATIVE
Glucose, UA: NEGATIVE mg/dL
Ketones, ur: NEGATIVE mg/dL
Leukocytes, UA: NEGATIVE
Nitrite: NEGATIVE
Protein, ur: 100 mg/dL — AB
Specific Gravity, Urine: 1.019 (ref 1.005–1.030)
UROBILINOGEN UA: 0.2 mg/dL (ref 0.0–1.0)
pH: 6.5 (ref 5.0–8.0)

## 2014-07-25 LAB — BASIC METABOLIC PANEL
Anion gap: 9 (ref 5–15)
BUN: 37 mg/dL — ABNORMAL HIGH (ref 6–20)
CO2: 26 mmol/L (ref 22–32)
CREATININE: 1.17 mg/dL (ref 0.61–1.24)
Calcium: 9.3 mg/dL (ref 8.9–10.3)
Chloride: 104 mmol/L (ref 101–111)
GFR calc Af Amer: >60 mL/min (ref >60)
GFR calc non Af Amer: 56 mL/min — ABNORMAL LOW (ref >60)
Glucose, Bld: 97 mg/dL (ref 65–99)
Potassium: 4.1 mmol/L (ref 3.5–5.1)
Sodium: 139 mmol/L (ref 135–145)

## 2014-07-25 LAB — I-STAT TROPONIN, ED: Troponin i, poc: 0 ng/mL (ref 0.00–0.08)

## 2014-07-25 LAB — CBC
HCT: 33.1 % — ABNORMAL LOW (ref 39.0–52.0)
Hemoglobin: 10.7 g/dL — ABNORMAL LOW (ref 13.0–17.0)
MCH: 28.2 pg (ref 26.0–34.0)
MCHC: 32.3 g/dL (ref 30.0–36.0)
MCV: 87.3 fL (ref 78.0–100.0)
PLATELETS: 120 10*3/uL — AB (ref 150–400)
RBC: 3.79 MIL/uL — ABNORMAL LOW (ref 4.22–5.81)
RDW: 16.6 % — AB (ref 11.5–15.5)
WBC: 4.9 10*3/uL (ref 4.0–10.5)

## 2014-07-25 LAB — TSH: TSH: 1.473 u[IU]/mL (ref 0.350–4.500)

## 2014-07-25 MED ORDER — SODIUM CHLORIDE 0.9 % IJ SOLN
3.0000 mL | Freq: Two times a day (BID) | INTRAMUSCULAR | Status: DC
  Administered 2014-07-26 – 2014-07-31 (×6): 3 mL via INTRAVENOUS

## 2014-07-25 MED ORDER — HYDROCODONE-ACETAMINOPHEN 5-325 MG PO TABS
1.0000 | ORAL_TABLET | Freq: Four times a day (QID) | ORAL | Status: DC | PRN
  Administered 2014-07-25 – 2014-07-31 (×9): 1 via ORAL
  Filled 2014-07-25 (×9): qty 1

## 2014-07-25 MED ORDER — JEVITY 1.2 CAL PO LIQD
237.0000 mL | Freq: Two times a day (BID) | ORAL | Status: DC
  Administered 2014-07-26 – 2014-08-01 (×12): 237 mL

## 2014-07-25 MED ORDER — FREE WATER
200.0000 mL | Freq: Four times a day (QID) | Status: DC
  Administered 2014-07-25 – 2014-08-01 (×26): 200 mL

## 2014-07-25 MED ORDER — SIMVASTATIN 20 MG PO TABS
20.0000 mg | ORAL_TABLET | Freq: Every day | ORAL | Status: DC
  Administered 2014-07-25 – 2014-07-31 (×6): 20 mg via ORAL
  Filled 2014-07-25 (×6): qty 1
  Filled 2014-07-25: qty 4

## 2014-07-25 MED ORDER — DRONABINOL 2.5 MG PO CAPS
2.5000 mg | ORAL_CAPSULE | Freq: Three times a day (TID) | ORAL | Status: DC
  Administered 2014-07-25 – 2014-08-01 (×19): 2.5 mg via ORAL
  Filled 2014-07-25 (×19): qty 1

## 2014-07-25 MED ORDER — ASPIRIN EC 81 MG PO TBEC: 81.0000 mg | DELAYED_RELEASE_TABLET | Freq: Every day | ORAL | Status: DC

## 2014-07-25 MED ORDER — MAGNESIUM OXIDE 400 (241.3 MG) MG PO TABS
200.0000 mg | ORAL_TABLET | Freq: Every day | ORAL | Status: DC
  Administered 2014-07-26 – 2014-07-29 (×4): 200 mg via ORAL
  Filled 2014-07-25 (×4): qty 1

## 2014-07-25 MED ORDER — JEVITY 1.2 CAL PO LIQD
1000.0000 mL | ORAL | Status: DC
  Administered 2014-07-25 – 2014-07-31 (×7): 1000 mL
  Filled 2014-07-25: qty 237
  Filled 2014-07-25 (×2): qty 1000
  Filled 2014-07-25 (×2): qty 237
  Filled 2014-07-25 (×2): qty 1000
  Filled 2014-07-25 (×3): qty 237

## 2014-07-25 MED ORDER — JEVITY 1.2 CAL PO LIQD: 1000.0000 mL | ORAL | Status: DC

## 2014-07-25 MED ORDER — LORAZEPAM 2 MG/ML IJ SOLN
1.0000 mg | Freq: Once | INTRAMUSCULAR | Status: DC
  Filled 2014-07-25: qty 1

## 2014-07-25 MED ORDER — ACETAMINOPHEN 500 MG PO TABS
1000.0000 mg | ORAL_TABLET | Freq: Four times a day (QID) | ORAL | Status: DC | PRN
Start: 2014-07-25 — End: 2014-08-01

## 2014-07-25 MED ORDER — VANCOMYCIN 50 MG/ML ORAL SOLUTION
125.0000 mg | Freq: Four times a day (QID) | ORAL | Status: DC
  Administered 2014-07-25 – 2014-07-31 (×22): 125 mg via ORAL
  Filled 2014-07-25 (×26): qty 2.5

## 2014-07-25 MED ORDER — PRO-STAT SUGAR FREE PO LIQD
30.0000 mL | Freq: Three times a day (TID) | ORAL | Status: DC
  Administered 2014-07-26 – 2014-08-01 (×17): 30 mL
  Filled 2014-07-25 (×36): qty 30

## 2014-07-25 MED ORDER — BISACODYL 10 MG RE SUPP: 10.0000 mg | RECTAL | Status: DC | PRN

## 2014-07-25 MED ORDER — FESOTERODINE FUMARATE ER 4 MG PO TB24
4.0000 mg | ORAL_TABLET | Freq: Every day | ORAL | Status: DC
  Administered 2014-07-26 – 2014-08-01 (×6): 4 mg via ORAL
  Filled 2014-07-25 (×7): qty 1

## 2014-07-25 MED ORDER — JEVITY 1.2 CAL PO LIQD: 237.0000 mL | Freq: Two times a day (BID) | ORAL | Status: DC

## 2014-07-25 MED ORDER — DEXTROSE 5 % IV SOLN
2.0000 g | Freq: Two times a day (BID) | INTRAVENOUS | Status: DC
  Administered 2014-07-25 – 2014-08-01 (×13): 2 g via INTRAVENOUS
  Filled 2014-07-25 (×15): qty 2

## 2014-07-25 NOTE — H&P (Signed)
[facility] History and Physical                                                                                    [name], is a 81 y.o. male  MRN: [id number]   DOB - [birth year]  Admit Date - 07/25/2014  Outpatient Primary MD for the patient is [name], MD  Referring MD: Dr. Mingo Amber / ER  With History of -  Past Medical History  Diagnosis Date  . Hypertension   . BPH (benign prostatic hyperplasia)   . Hyperlipidemia   . AAA (abdominal aortic aneurysm) 04/2012    STENTING OF AAA IN [place]  . Junctional cardiac arrhythmia     Occurred postoperatively after urologic surgery  . Severe sinus bradycardia     Occurred postoperatively after urologic surgery  . Aortic valve disorders   . Other primary cardiomyopathies   . Dysphagia   . Status post insertion of percutaneous endoscopic gastrostomy (PEG) tube 02/11/14  . Bacteremia due to Pseudomonas   . Infected aortic graft   . DVT (deep venous thrombosis) 04/2013    s/p IVC Filter  . Cancer 2008-2009    prostate TREATED WITH RADIATION      Past Surgical History  Procedure Laterality Date  . Colectomy      FOR DIVERTICULITIS  . Appendectomy    . Hernia repair    . Bladder surgery  2008    FOR BLADDER STONE  . Total knee arthroplasty Left 1990  . Abdominal aortic aneurysm repair  04/2012  . Total knee arthroplasty Right 07/26/2012    Procedure: RIGHT TOTAL KNEE ARTHROPLASTY;  Surgeon: Tobi Bastos, MD;  Location: [facility];  Service: Orthopedics;  Laterality: Right;  . Nephrolithotomy Left 04/26/2013    Procedure: LEFT PERCUTANEOUS NEPHROLITHOTOMY ;  Surgeon: [name], MD;  Location: [facility];  Service: Urology;  Laterality: Left;  . Nephrolithotomy Left 05/03/2013    Procedure: 2ND STAGE LEFT PERCUTANEOUS NEPHROLITHOTOMY ;  Surgeon: [name], MD;  Location: [facility];  Service: Urology;  Laterality: Left;  . Esophagogastroduodenoscopy N/A 02/11/2014    [facility]:CWCBJ hiatal hernia. Patchy erythema of the  gastric mucosa; otherwise negative EGD. Status post 63 French Microvasive PEG tube placement  . Peg placement N/A 02/11/2014    Procedure: PERCUTANEOUS ENDOSCOPIC GASTROSTOMY (PEG) PLACEMENT;  Surgeon: Daneil Dolin, MD;  Location: [facility];  Service: Endoscopy;  Laterality: N/A;  . Tee without cardioversion N/A 05/02/2014    Procedure: TRANSESOPHAGEAL ECHOCARDIOGRAM (TEE);  Surgeon: Lelon Perla, MD;  Location: [facility] END[facility];  Service: Cardiovascular;  Laterality: N/A;  . Joint replacement    . Cataract extraction, bilateral Bilateral   . Vena cava filter placement  2015    [facility] Endo 05/14/2013  . Prostate biopsy      hx/notes 04/25/2013  . Hemorrhoid surgery      hx/notes 04/25/2013  . Transurethral resection of prostate      hx/notes 04/25/2013    in for   Chief Complaint  Patient presents with  . Weakness     HPI This is a pleasant 81 year old male patient well known to the hospitalist service who was just  discharged on 5/6 after being hospitalized for recurrent sepsis. 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 This infection has been determined not curable with antibiotic therapy and will require lifelong suppressive therapy to prevent relapse of fever. He is on chronic IV cefepime at home. He also has chronic C. difficile colitis (currently without diarrhea) controlled on chronic oral vancomycin. He also has a history of DVT status post IVC filter, hypertension, dyslipidemia, chronic anorexia and failure to thrive requiring gastrostomy feeds to maintain nutritional support, chronic kidney disease, chronic anemia with chronic thrombocytopenia and macular degeneration. Prior to patient's last admission (about 2 weeks ago) patient had a fall at home. During the last hospitalization he had no apparent neurological deficits and after arrival to home patient was able to  ambulate as per baseline with a walker without any focal neurological deficits although he seemed to have generalized weakness c/w deconditioning. Beginning this past Monday the patient noticed that when he was walking his right leg seemed to be weaker and he felt like he may not be able to feel the leg while he was placing leg on the floor. Over the next several days symptoms worsened and began to involve the right arm with questionable spasticity of the right arm as well as generalized weakness and trunk instability with attempts to sit upright. Today patient was unable to ambulate at all even with a walker and assistance of his family so he was brought to the ER for further evaluation.  In the ER he was afebrile and hemodynamically stable and maintaining room air sats of 99%. Neurological exam by the ER physician did reveal right-sided weakness. Noncontrasted CT of the head revealed a subdural hematoma along the posterior falx up to 8 mm thick but no other intraparenchymal brain abnormalities; was also noted to be an old lacunar infarct in the left external capsule. Chest x-ray was unremarkable with slight shifting position of the left peak with the tip at the level of the proximal superior vena cava and directed slightly laterally rather than inferiorly. Laboratory data unremarkable with electrolytes panel and CBC at baseline. Urinalysis unremarkable. At family's request infectious disease service was notified of patient's admission. The ER physician also spoke with Dr. Vertell Limber of neurosurgery; CT of the head was reviewed by the neurosurgeon and no indications for acute surgical intervention noted at this juncture but recommendation was to admit patient for further observation. Of note physical therapy did come to the house to complete an evaluation on 5/11 with plans to begin physical therapy later this week. Patient was noted to be weak during their evaluation. Also noted to be dragging right leg during the  exam.   Review of Systems   In addition to the HPI above,  No Fever-chills, myalgias or other constitutional symptoms No Headache, changes with Vision or hearing No new problems swallowing food or Liquids, indigestion/reflux No Chest pain, Cough or Shortness of Breath, palpitations, orthopnea or DOE No Abdominal pain, N/V; no melena or hematochezia, no dark tarry stools, Bowel movements are regular, No dysuria, hematuria or flank pain No new skin rashes, lesions, masses or bruises, No new joints pains-aches No recent weight gain or loss No polyuria, polydypsia or polyphagia,  *A full 10 point Review of Systems was done, except as stated above, all other Review of Systems were negative.  Social History History  Substance Use Topics  . Smoking status: Former  Smoker -- 2.00 packs/day for 37 years    Types: Cigarettes    Start date: 02/17/1949    Quit date: 03/15/1985  . Smokeless tobacco: Former Systems developer  . Alcohol Use: No    Resides at: Private residence  Lives with: Family/daughters  Ambulatory status: Up until Monday had been ambulatory with assistance including utilization of walker with new acute right-sided weakness causing patient to not be able to ambulate as of date of admission even with assistance and assistive devices   Family History Family History  Problem Relation Age of Onset  . Lung cancer Father      Prior to Admission medications   Medication Sig Start Date End Date Taking? Authorizing Provider  acetaminophen (TYLENOL) 500 MG tablet Take 1,000 mg by mouth every 6 (six) hours as needed for mild pain or moderate pain.   Yes Historical Provider, MD  Amino Acids-Protein Hydrolys (FEEDING SUPPLEMENT, PRO-STAT SUGAR FREE 64,) LIQD Take 30 mLs by mouth 3 (three) times daily with meals. 05/07/14  Yes Albertine Patricia, MD  aspirin EC 81 MG tablet Take 81 mg by mouth daily.   Yes Historical Provider, MD  bisacodyl (DULCOLAX) 10 MG suppository Place 1 suppository (10  mg total) rectally as needed for mild constipation or moderate constipation. 05/07/14  Yes Silver Huguenin Elgergawy, MD  ceFEPIme 2 g in dextrose 5 % 50 mL Inject 2 g into the vein every 12 (twelve) hours. 07/19/14  Yes Modena Jansky, MD  dronabinol (MARINOL) 2.5 MG capsule Take 1 capsule (2.5 mg total) by mouth 3 (three) times daily. 06/07/14  Yes [name], MD  magnesium oxide (MAG-OX) 400 (241.3 MG) MG tablet Take 0.5 tablets (200 mg total) by mouth daily. 04/23/14  Yes Geradine Girt, DO  Multiple Vitamin (MULTI-VITAMIN PO) Take 1 tablet by mouth daily.   Yes Historical Provider, MD  Nutritional Supplements (FEEDING SUPPLEMENT, JEVITY 1.2 CAL,) LIQD Place 1,000 mLs into feeding tube daily. Run for 12 hours out of each 24 07/19/14  Yes Modena Jansky, MD  simvastatin (ZOCOR) 20 MG tablet Take 1 tablet (20 mg total) by mouth daily. Patient taking differently: Take 20 mg by mouth daily at 6 PM.  05/07/14  Yes Albertine Patricia, MD  tolterodine (DETROL LA) 4 MG 24 hr capsule Take 4 mg by mouth 2 (two) times daily.   Yes Historical Provider, MD  vancomycin (VANCOCIN) 50 mg/mL oral solution Take 2.5 mLs (125 mg total) by mouth 4 (four) times daily. 06/03/14  Yes Michel Bickers, MD  Water For Irrigation, Sterile (FREE WATER) SOLN Place 200 mLs into feeding tube 4 (four) times daily. 05/07/14  Yes Albertine Patricia, MD    Allergies  Allergen Reactions  . Atorvastatin Other (See Comments)    Causes constipation  . Penicillins Other (See Comments)    Other reaction(s): RASH HIVES  Tolerates cefepime 2/22    Physical Exam  Vitals  Blood pressure 159/92, pulse 65, temperature 97.7 F (36.5 C), temperature source Oral, resp. rate 20, weight 212 lb (96.163 kg), SpO2 100 %.   General:  In no acute distress, appears chronically ill  Psych:  Normal affect, Denies Suicidal or Homicidal ideations, Awake Alert, Oriented X 3. Speech and thought patterns are clear and appropriate  Neuro: Noted with right  sided weakness as follows: Right upper extremity extensor resistance 4/5 with flexor resistance 3/5, right lower extremity extensor and flexor resistance 4 out of 5 but patient unable to lift leg of  bed, right patellar DTR diminished and definitely not as brisk as left patellar DTR; no left-sided deficits appreciated, cranial nerves II through XII grossly intact.  ENT:  Ears and Eyes appear Normal, Conjunctivae clear, PER. Moist oral mucosa without erythema or exudates.  Neck:  Supple, No lymphadenopathy appreciated  Respiratory:  Symmetrical chest wall movement, Good air movement bilaterally, CTAB. Room Air  Cardiac:  RRR, No Murmurs, no LE edema noted, no JVD, No carotid bruits, peripheral pulses palpable at 2+  Abdomen:  Positive bowel sounds, Soft, Non tender, Non distended,  No masses appreciated, no obvious hepatosplenomegaly, PEG tube in place  Skin:  No Cyanosis, Normal Skin Turgor, No Skin Rash or Bruise.  Extremities: Symmetrical without obvious trauma or injury,  no effusions.  Data Review  CBC  Recent Labs Lab 07/25/14 1205  WBC 4.9  HGB 10.7*  HCT 33.1*  PLT 120*  MCV 87.3  MCH 28.2  MCHC 32.3  RDW 16.6*    Chemistries   Recent Labs Lab 07/25/14 1205  NA 139  K 4.1  CL 104  CO2 26  GLUCOSE 97  BUN 37*  CREATININE 1.17  CALCIUM 9.3    estimated creatinine clearance is 56.6 mL/min (by C-G formula based on Cr of 1.17).  No results for input(s): TSH, T4TOTAL, T3FREE, THYROIDAB in the last 72 hours.  Invalid input(s): FREET3  Coagulation profile No results for input(s): INR, PROTIME in the last 168 hours.  No results for input(s): DDIMER in the last 72 hours.  Cardiac Enzymes No results for input(s): CKMB, TROPONINI, MYOGLOBIN in the last 168 hours.  Invalid input(s): CK  Invalid input(s): POCBNP  Urinalysis    Component Value Date/Time   COLORURINE YELLOW 07/25/2014 1400   APPEARANCEUR CLEAR 07/25/2014 1400   LABSPEC 1.019 07/25/2014  1400   PHURINE 6.5 07/25/2014 1400   GLUCOSEU NEGATIVE 07/25/2014 1400   HGBUR MODERATE* 07/25/2014 1400   BILIRUBINUR NEGATIVE 07/25/2014 1400   KETONESUR NEGATIVE 07/25/2014 1400   PROTEINUR 100* 07/25/2014 1400   UROBILINOGEN 0.2 07/25/2014 1400   NITRITE NEGATIVE 07/25/2014 1400   LEUKOCYTESUR NEGATIVE 07/25/2014 1400    Imaging results:   Dg Chest 1 View  07/16/2014   CLINICAL DATA:  Upper to mid back pain after fall on Friday.  EXAM: CHEST  1 VIEW  COMPARISON:  05/08/2014  FINDINGS: There is an endoluminal descending thoracic aortic stent graft. There are unchanged moderately prominent aortic contours. There is a left upper extremity PICC line extending into the SVC. There is unchanged left hemidiaphragm elevation. There is mild interstitial coarsening and mild linear left base scarring, unchanged.  IMPRESSION: No acute cardiopulmonary findings.   Electronically Signed   By: Andreas Newport M.D.   On: 07/16/2014 00:27   Dg Chest 2 View  07/25/2014   CLINICAL DATA:  81 year old male with progressive weakness. Subsequent encounter.  EXAM: CHEST  2 VIEW  COMPARISON:  07/15/2014.  FINDINGS: Left PICC line tip has been retracted slightly with tip at the level of the proximal superior vena cava and directed slightly laterally rather than inferiorly.  Post endovascular stenting descending thoracic aorta. Calcified ectatic ascending thoracic aorta and aortic arch.  Cardiomegaly.  Pulmonary vascular prominence most notable centrally.  Elevated left hemidiaphragm with gas-filled bowel below diaphragm. Left base subsegmental atelectasis.  No pneumothorax.  Shoulder joint degenerative changes.  IMPRESSION: Cardiomegaly  Pulmonary vascular prominence most notable centrally  Elevated left hemidiaphragm with left base subsegmental atelectasis.  Slight shifting position of the left  PICC line as detailed above.  Post endovascular stent placement descending thoracic aorta with calcified ectatic ascending  thoracic aorta and aortic arch.   Electronically Signed   By: Genia Del M.D.   On: 07/25/2014 12:59   Dg Thoracic Spine W/swimmers  07/16/2014   CLINICAL DATA:  Upper mid back pain after fall on Friday  EXAM: THORACIC SPINE - 2 VIEW + SWIMMERS  COMPARISON:  None.  FINDINGS: There is no evidence of acute thoracic spine fracture. There is moderate kyphosis. There is moderately severe degenerative disc change at multiple levels. There is no bone lesion or bony destruction. The endoluminal aortic stent graft is again noted, without change and aortic contour.  IMPRESSION: Negative for acute thoracic spine fracture. Moderate kyphosis and degenerative change.   Electronically Signed   By: Andreas Newport M.D.   On: 07/16/2014 00:28   Ct Head Wo Contrast  07/25/2014   CLINICAL DATA:  Progressive weakness for couple weeks, loss of strength in lower extremities, increased weakness RIGHT leg, hypertension, cardiomyopathy  EXAM: CT HEAD WITHOUT CONTRAST  TECHNIQUE: Contiguous axial images were obtained from the base of the skull through the vertex without intravenous contrast.  COMPARISON:  11/12/2010  FINDINGS: Generalized atrophy.  Cavum septum pellucidum and vergae.  Otherwise normal ventricular morphology.  No midline shift or mass effect.  Old lacunar infarct LEFT external capsule.  Parafalcine high attenuation at posterior LEFT falx compatible with subdural hematoma measuring up to 8 mm thick.  No intraparenchymal hemorrhage, mass lesion or evidence acute infarction.  Visualized paranasal sinuses and mastoid air cells clear.  No acute osseous findings.  IMPRESSION: Subdural hematoma along the posterior LEFT falx up to 8 mm thick.  No intraparenchymal brain abnormalities.  Old lacunar infarct LEFT external capsule.  Findings called to Hyman Bible on 07/25/2014 at 1258 hours.   Electronically Signed   By: Lavonia Dana M.D.   On: 07/25/2014 12:58     EKG: (Independently reviewed) sinus rhythm without any  ST-T wave changes concerning for ischemia, QTC prolonged at 524 ms which is chronic for this patient.   Assessment & Plan  Principal Problem:   Subdural hematoma with associated acute right-sided weakness -Admit to neuro telemetry -Appreciate previous neurosurgery/Dr. Vertell Limber input; no acute surgical issues elucidated -Patient did have fall 2 weeks ago prior to previous admission and unclear if this is a delayed response traumatic subdural or spontaneous given patient also has chronic thrombocytopenia and platelets at times do fall below 100,000 -Repeat CT head noncontrasted in a.m. to determine if subdural remained stable -Patient having issues with dragging right leg and at family request we'll consult neurology in the event this is an isolated finding and not related to subdural hematoma (family request-non-urgent-spoken to Dr. Leonel Ramsay and informed if necessary may wait until 5/13 to evaluate patient) -PT/OT evaluation beginning 5/13-patient re: scheduled for outpatient PT/OT prior to this admission -No pharmacological DVT prophylaxis  Active Problems:   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/History of Clostridium difficile colitis -ID physician consulted by ER staff -Continue preadmission IV cefepime and oral vancomycin -PICC line dressing due to be changed today and question of change in positioning of PICC line based on chest x-ray/IV team to evaluate -Currently hemodynamically stable and afebrile and no signs of acute sepsis      Protein-calorie malnutrition, severe/Status post insertion PEG tube/FTT in adult -Continue twice a day bolus feeds during the day and continuous tube feedings and 100  mL per hour from 6 PM to 6 AM -Continue free water as prior to admission -Continue Protostat as prior to admission -Continue oral clear liquids with Gatorade preferred as prior to admission -Continue Marinol as prior to admission     Cardiomyopathy-EF 35-40% by echo Jan 2016 -Compensated without any signs of heart failure exacerbation    HTN  -Blood pressure controlled -Not all medications prior to admission    Chronic Thrombocytopenia -Platelets greater than 100,000 and at baseline    History of DVT of lower extremity (Feb 2015) -post IVC filter    Stage III chronic kidney disease -No function stable and at baseline    DVT Prophylaxis: SCDs  Family Communication:  Both daughters at bedside  Code Status: Partial resuscitation/DO NOT INTUBATE    Condition:  Stable  Discharge disposition: Anticipate return to home environment with family and to continue PT /OT at home unless evaluation during this hospitalization has alternative recommendations  Time spent in minutes : 60      Gavino Fouch L. ANP on 07/25/2014 at 3:05 PM  Between 7am to 7pm - Pager - [phone]  After 7pm go to [email or web address] - password [email or web address]  And look for the night coverage person covering me after hours  [facility]

## 2014-07-25 NOTE — Progress Notes (Signed)
Chaplain responded to referral from pt nurse. Chaplain introduced herself to pt and pt daughter. Pt stated that he will be supported by his pastor. Chaplain offered to keep pt in her prayers; gesture appreciated.    07/25/14 1811  Clinical Encounter Type  Visited With Patient and family together  Visit Type Initial;Spiritual support  Referral From Nurse  Spiritual Encounters  Spiritual Needs Emotional;Prayer  Stress Factors  Patient Stress Factors Health changes  Advance Directives (For Healthcare)  Does patient have an advance directive? Yes  Marcelino Scot 07/25/2014 6:27 PM

## 2014-07-25 NOTE — Telephone Encounter (Addendum)
Pt is unable to stand with assistance or sit on the bedside commode safely.  Family is having pt taken to ED at [facility] for transport to [facility] ED.  Bradley RN coming to home to evaluate patient and to convince him to go to ED.  Daughter shared that she and the rest of the family is "tired" after taking care of the pt at home for 9 months.

## 2014-07-25 NOTE — Progress Notes (Signed)
Pt arrived 16:48 pm alert, verbal with no noted distress. Pt stable, neuro intact. Daughter at bedside. Pt oriented to room. Safety measures in place. Call bell within reach. Will continue to monitor.

## 2014-07-25 NOTE — Progress Notes (Signed)
RN called by radiology saying pt unable to lay still for MRI and very anxious. MD was paged to get anxiety medication to help with test. 1mg  ativan ordered and brought down to patient at radiology. RN about to administer medication when pt asked if this medication would "put me to sleep". Pt stated he refused to do MRI if he is not put to sleep. Also stated to RN and radiology tech multiple times that he would "sue hospital if we tried to do the MRI" if he was awake. Pt is alert and oriented x4. Explained to patient that we can not put him to sleep tonight and will need to discuss options tomorrow. Pt was brought back upstairs currently laying in bed calm.  Ativan was wasted in sharps in med room with charge RN Dorisann Frames.

## 2014-07-25 NOTE — ED Notes (Signed)
Pt reports for the past couple of weeks he has been progressively weak. Pt had a AAA repair last year. Pt currently has a bacterial infection in the abd and gets antibiotics twice a day for that. Pt has slowly loss strength in the lower extremities. Pt reports increased weakness in the right leg.

## 2014-07-25 NOTE — ED Notes (Signed)
IV team at bedside 

## 2014-07-25 NOTE — ED Provider Notes (Signed)
CSN: [id number]     Arrival date & time 07/25/14  1100 History   First MD Initiated Contact with Patient 07/25/14 1128     Chief Complaint  Patient presents with  . 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  He reports that he has been increasingly weak over the past 3 days.  He has been dragging his right leg while ambulating over the past few days, which family reports is new.  He ambulates with a walker.  He reports that he fell and hit his head 2 weeks ago.  He is currently on ASA, but no other anticoagulants.  He denies facial asymmetry, difficulty speaking, difficulty swallowing, numbness, tingling, or changes in vision.  He denies fever, chills, chest pain, or SOB.  He is currently being treated at home for the infection of the AAA Endograft with IV Cefipime, which he is getting through a PICC line.   Patient is a 81 y.o. male presenting with weakness. The history is provided by the patient and the EMS personnel.  Weakness Associated symptoms include weakness.    Past Medical History  Diagnosis Date  . Hypertension   . BPH (benign prostatic hyperplasia)   . Hyperlipidemia   . AAA (abdominal aortic aneurysm) 04/2012    STENTING OF AAA IN [place]  . Junctional cardiac arrhythmia     Occurred postoperatively after urologic surgery  . Severe sinus bradycardia     Occurred postoperatively after urologic surgery  . Aortic valve disorders   . Other primary cardiomyopathies   . Dysphagia   . Status post insertion of percutaneous endoscopic gastrostomy (PEG) tube 02/11/14  . Bacteremia due to Pseudomonas   . Infected aortic graft   . DVT (deep venous thrombosis) 04/2013    s/p IVC Filter  . Cancer  2008-2009    prostate TREATED WITH RADIATION   Past Surgical History  Procedure Laterality Date  . Colectomy      FOR DIVERTICULITIS  . Appendectomy    . Hernia repair    . Bladder surgery  2008    FOR BLADDER STONE  . Total knee arthroplasty Left 1990  . Abdominal aortic aneurysm repair  04/2012  . Total knee arthroplasty Right 07/26/2012    Procedure: RIGHT TOTAL KNEE ARTHROPLASTY;  Surgeon: Tobi Bastos, MD;  Location: [facility];  Service: Orthopedics;  Laterality: Right;  . Nephrolithotomy Left 04/26/2013    Procedure: LEFT PERCUTANEOUS NEPHROLITHOTOMY ;  Surgeon: [name], MD;  Location: [facility];  Service: Urology;  Laterality: Left;  . Nephrolithotomy Left 05/03/2013    Procedure: 2ND STAGE LEFT PERCUTANEOUS NEPHROLITHOTOMY ;  Surgeon: [name], MD;  Location: [facility];  Service: Urology;  Laterality: Left;  . Esophagogastroduodenoscopy N/A 02/11/2014    WUJ:WJXBJ hiatal hernia. Patchy erythema of the gastric mucosa; otherwise negative EGD. Status post 53 French Microvasive PEG tube placement  . Peg placement N/A 02/11/2014    Procedure: PERCUTANEOUS ENDOSCOPIC GASTROSTOMY (PEG) PLACEMENT;  Surgeon: Daneil Dolin, MD;  Location: [facility];  Service: Endoscopy;  Laterality: N/A;  . Tee without cardioversion N/A 05/02/2014    Procedure: TRANSESOPHAGEAL ECHOCARDIOGRAM (TEE);  Surgeon: Lelon Perla, MD;  Location: [facility] END[facility]COPY;  Service: Cardiovascular;  Laterality: N/A;  . Joint replacement    . Cataract extraction,  bilateral Bilateral   . Vena cava filter placement  2015    Archie Endo 05/14/2013  . Prostate biopsy      hx/notes 04/25/2013  . Hemorrhoid surgery      hx/notes 04/25/2013  . Transurethral resection of prostate      hx/notes 04/25/2013   Family History  Problem Relation Age of Onset  . Lung cancer Father    History  Substance Use Topics  . Smoking status: Former Smoker -- 2.00 packs/day for 37 years    Types: Cigarettes    Start date: 02/17/1949    Quit date:  03/15/1985  . Smokeless tobacco: Former Systems developer  . Alcohol Use: No    Review of Systems  Neurological: Positive for weakness.  All other systems reviewed and are negative.     Allergies  Atorvastatin and Penicillins  Home Medications   Prior to Admission medications   Medication Sig Start Date End Date Taking? Authorizing Provider  acetaminophen (TYLENOL) 500 MG tablet Take 1,000 mg by mouth every 6 (six) hours as needed for mild pain or moderate pain.   Yes Historical Provider, MD  Amino Acids-Protein Hydrolys (FEEDING SUPPLEMENT, PRO-STAT SUGAR FREE 64,) LIQD Take 30 mLs by mouth 3 (three) times daily with meals. 05/07/14  Yes Albertine Patricia, MD  aspirin EC 81 MG tablet Take 81 mg by mouth daily.   Yes Historical Provider, MD  bisacodyl (DULCOLAX) 10 MG suppository Place 1 suppository (10 mg total) rectally as needed for mild constipation or moderate constipation. 05/07/14  Yes Silver Huguenin Elgergawy, MD  ceFEPIme 2 g in dextrose 5 % 50 mL Inject 2 g into the vein every 12 (twelve) hours. 07/19/14  Yes Modena Jansky, MD  dronabinol (MARINOL) 2.5 MG capsule Take 1 capsule (2.5 mg total) by mouth 3 (three) times daily. 06/07/14  Yes [name], MD  magnesium oxide (MAG-OX) 400 (241.3 MG) MG tablet Take 0.5 tablets (200 mg total) by mouth daily. 04/23/14  Yes Geradine Girt, DO  Multiple Vitamin (MULTI-VITAMIN PO) Take 1 tablet by mouth daily.   Yes Historical Provider, MD  Nutritional Supplements (FEEDING SUPPLEMENT, JEVITY 1.2 CAL,) LIQD Place 1,000 mLs into feeding tube daily. Run for 12 hours out of each 24 07/19/14  Yes Modena Jansky, MD  simvastatin (ZOCOR) 20 MG tablet Take 1 tablet (20 mg total) by mouth daily. Patient taking differently: Take 20 mg by mouth daily at 6 PM.  05/07/14  Yes Albertine Patricia, MD  tolterodine (DETROL LA) 4 MG 24 hr capsule Take 4 mg by mouth 2 (two) times daily.   Yes Historical Provider, MD  vancomycin (VANCOCIN) 50 mg/mL oral solution Take 2.5 mLs  (125 mg total) by mouth 4 (four) times daily. 06/03/14  Yes Michel Bickers, MD  Water For Irrigation, Sterile (FREE WATER) SOLN Place 200 mLs into feeding tube 4 (four) times daily. 05/07/14  Yes Silver Huguenin Elgergawy, MD   BP 154/75 mmHg  Pulse 67  Temp(Src) 97.7 F (36.5 C) (Oral)  Resp 22  Wt 212 lb (96.163 kg)  SpO2 99% Physical Exam  Constitutional: He appears well-developed and well-nourished.  HENT:  Head: Normocephalic and atraumatic.  Mouth/Throat: Oropharynx is clear and moist.  Eyes: EOM are normal. Pupils are equal, round, and reactive to light.  Neck: Normal range of motion. Neck supple.  Cardiovascular: Normal rate, regular rhythm and normal heart sounds.   Pulmonary/Chest: Effort normal and breath sounds normal.  Abdominal: Soft. Bowel sounds are normal.  PEG tube appears to be in place.  No surrounding erythema, warmth, or edema  Musculoskeletal: Normal range of motion.  PICC line present.  No signs of infection at the PICC site  Neurological: He is alert. No cranial nerve deficit or sensory deficit.  Grip strength 5/5 bilaterally Patient unable to lift his right leg off of the bed.  Muscle strength 4/5 of LLE, 5/5 RLE  Skin: Skin is warm and dry.  Psychiatric: He has a normal mood and affect.  Nursing note and vitals reviewed.   ED Course  Procedures (including critical care time) Labs Review Labs Reviewed  CBC - Abnormal; Notable for the following:    RBC 3.79 (*)    Hemoglobin 10.7 (*)    HCT 33.1 (*)    RDW 16.6 (*)    Platelets 120 (*)    All other components within normal limits  BASIC METABOLIC PANEL - Abnormal; Notable for the following:    BUN 37 (*)    GFR calc non Af Amer 56 (*)    All other components within normal limits  URINALYSIS, ROUTINE W REFLEX MICROSCOPIC  I-STAT TROPOININ, ED    Imaging Review Dg Chest 2 View  07/25/2014   CLINICAL DATA:  81 year old male with progressive weakness. Subsequent encounter.  EXAM: CHEST  2 VIEW   COMPARISON:  07/15/2014.  FINDINGS: Left PICC line tip has been retracted slightly with tip at the level of the proximal superior vena cava and directed slightly laterally rather than inferiorly.  Post endovascular stenting descending thoracic aorta. Calcified ectatic ascending thoracic aorta and aortic arch.  Cardiomegaly.  Pulmonary vascular prominence most notable centrally.  Elevated left hemidiaphragm with gas-filled bowel below diaphragm. Left base subsegmental atelectasis.  No pneumothorax.  Shoulder joint degenerative changes.  IMPRESSION: Cardiomegaly  Pulmonary vascular prominence most notable centrally  Elevated left hemidiaphragm with left base subsegmental atelectasis.  Slight shifting position of the left PICC line as detailed above.  Post endovascular stent placement descending thoracic aorta with calcified ectatic ascending thoracic aorta and aortic arch.   Electronically Signed   By: Genia Del M.D.   On: 07/25/2014 12:59   Ct Head Wo Contrast  07/25/2014   CLINICAL DATA:  Progressive weakness for couple weeks, loss of strength in lower extremities, increased weakness RIGHT leg, hypertension, cardiomyopathy  EXAM: CT HEAD WITHOUT CONTRAST  TECHNIQUE: Contiguous axial images were obtained from the base of the skull through the vertex without intravenous contrast.  COMPARISON:  11/12/2010  FINDINGS: Generalized atrophy.  Cavum septum pellucidum and vergae.  Otherwise normal ventricular morphology.  No midline shift or mass effect.  Old lacunar infarct LEFT external capsule.  Parafalcine high attenuation at posterior LEFT falx compatible with subdural hematoma measuring up to 8 mm thick.  No intraparenchymal hemorrhage, mass lesion or evidence acute infarction.  Visualized paranasal sinuses and mastoid air cells clear.  No acute osseous findings.  IMPRESSION: Subdural hematoma along the posterior LEFT falx up to 8 mm thick.  No intraparenchymal brain abnormalities.  Old lacunar infarct LEFT  external capsule.  Findings called to Hyman Bible on 07/25/2014 at 1258 hours.   Electronically Signed   By: Lavonia Dana M.D.   On: 07/25/2014 12:58     EKG Interpretation None     2:01 PM Discussed with Dr. Vertell Limber with Neurosurgery. He states that the subdural hematoma is not surgical.   MDM   Final diagnoses:  Cough   Patient presents today with right sided weakness that has  been present over the past 3 days.  Family report that he has been dragging his right leg while ambulating. Patient reports a fall 2 weeks ago.  CT head showing a subdural hematoma along the posterior left falx up to 8 mm thick.  Discussed results with Dr. Vertell Limber with Neurosurgery.  He does not feel that this is surgical and does not feel that this would be causing the patient's weakness.  Patient currently on ASA, but no other anticoagulants.  Patient admitted to [facility] for further management and monitoring.      Hyman Bible, PA-C 07/26/14 1208  Evelina Bucy, MD 07/26/14 6004  Evelina Bucy, MD 07/26/14 2329

## 2014-07-25 NOTE — Consult Note (Signed)
Referring Physician: Tat    Chief Complaint: right leg weakness  HPI:                                                                                                                                         [name] is an 81 y.o. male who suffered a fall 2 weeks ago.  At that time no imaging was obtained at that time. Per family he was walking well and had no leg weakness.  He was later admitted to hospital for fever and infection. Per family he was not very mobile while in hospital. He returned home this past Saturday. They did not note any leg weakness at that time.  This Sunday family noted he was GENERALLY weak and having difficulty getting out of a chair.  Daughter noted he was not moving his right leg well and seemed to be dragging his leg. HE was brought back to hospital due to increased right leg weakness. CT head was obtained today and a subdural hematoma along the posterior LEFT falx up to 8 mm thick.  Date last known well: Date: 07/22/2014 Time last known well: Unable to determine tPA Given: No: bleed Modified Rankin: Rankin Score=1    Past Medical History  Diagnosis Date  . Hypertension   . BPH (benign prostatic hyperplasia)   . Hyperlipidemia   . AAA (abdominal aortic aneurysm) 04/2012    STENTING OF AAA IN [place]  . Junctional cardiac arrhythmia     Occurred postoperatively after urologic surgery  . Severe sinus bradycardia     Occurred postoperatively after urologic surgery  . Aortic valve disorders   . Other primary cardiomyopathies   . Dysphagia   . Status post insertion of percutaneous endoscopic gastrostomy (PEG) tube 02/11/14  . Bacteremia due to Pseudomonas   . Infected aortic graft   . DVT (deep venous thrombosis) 04/2013    s/p IVC Filter  . Cancer 2008-2009    prostate TREATED WITH RADIATION    Past Surgical History  Procedure Laterality Date  . Colectomy      FOR DIVERTICULITIS  . Appendectomy    . Hernia repair    . Bladder surgery  2008     FOR BLADDER STONE  . Total knee arthroplasty Left 1990  . Abdominal aortic aneurysm repair  04/2012  . Total knee arthroplasty Right 07/26/2012    Procedure: RIGHT TOTAL KNEE ARTHROPLASTY;  Surgeon: Tobi Bastos, MD;  Location: [facility];  Service: Orthopedics;  Laterality: Right;  . Nephrolithotomy Left 04/26/2013    Procedure: LEFT PERCUTANEOUS NEPHROLITHOTOMY ;  Surgeon: [name], MD;  Location: [facility];  Service: Urology;  Laterality: Left;  . Nephrolithotomy Left 05/03/2013    Procedure: 2ND STAGE LEFT PERCUTANEOUS NEPHROLITHOTOMY ;  Surgeon: [name], MD;  Location: [facility];  Service: Urology;  Laterality: Left;  . Esophagogastroduodenoscopy N/A 02/11/2014  PPI:RJJOA hiatal hernia. Patchy erythema of the gastric mucosa; otherwise negative EGD. Status post 17 French Microvasive PEG tube placement  . Peg placement N/A 02/11/2014    Procedure: PERCUTANEOUS ENDOSCOPIC GASTROSTOMY (PEG) PLACEMENT;  Surgeon: Daneil Dolin, MD;  Location: [facility];  Service: Endoscopy;  Laterality: N/A;  . Tee without cardioversion N/A 05/02/2014    Procedure: TRANSESOPHAGEAL ECHOCARDIOGRAM (TEE);  Surgeon: Lelon Perla, MD;  Location: [facility] ENDOSCOPY;  Service: Cardiovascular;  Laterality: N/A;  . Joint replacement    . Cataract extraction, bilateral Bilateral   . Vena cava filter placement  2015    Archie Endo 05/14/2013  . Prostate biopsy      hx/notes 04/25/2013  . Hemorrhoid surgery      hx/notes 04/25/2013  . Transurethral resection of prostate      hx/notes 04/25/2013    Family History  Problem Relation Age of Onset  . Lung cancer Father    Social History:  reports that he quit smoking about 29 years ago. His smoking use included Cigarettes. He started smoking about 65 years ago. He has a 74 pack-year smoking history. He has quit using smokeless tobacco. He reports that he does not drink alcohol or use illicit drugs.  Allergies:  Allergies  Allergen Reactions  . Atorvastatin Other (See  Comments)    Causes constipation  . Penicillins Other (See Comments)    Other reaction(s): RASH HIVES  Tolerates cefepime 2/22    Medications:                                                                                                                           Current Facility-Administered Medications  Medication Dose Route Frequency Provider Last Rate Last Dose  . feeding supplement (JEVITY 1.2 CAL) liquid 1,000 mL  1,000 mL Per Tube Continuous Samella Parr, NP       And  . [START ON 07/26/2014] feeding supplement (JEVITY 1.2 CAL) liquid 237 mL  237 mL Per Tube BID WC Samella Parr, NP      . feeding supplement (PRO-STAT SUGAR FREE 64) liquid 30 mL  30 mL Per Tube TID Samella Parr, NP       Current Outpatient Prescriptions  Medication Sig Dispense Refill  . acetaminophen (TYLENOL) 500 MG tablet Take 1,000 mg by mouth every 6 (six) hours as needed for mild pain or moderate pain.    . Amino Acids-Protein Hydrolys (FEEDING SUPPLEMENT, PRO-STAT SUGAR FREE 64,) LIQD Take 30 mLs by mouth 3 (three) times daily with meals. 900 mL 0  . aspirin EC 81 MG tablet Take 81 mg by mouth daily.    . bisacodyl (DULCOLAX) 10 MG suppository Place 1 suppository (10 mg total) rectally as needed for mild constipation or moderate constipation. 12 suppository 0  . ceFEPIme 2 g in dextrose 5 % 50 mL Inject 2 g into the vein every 12 (twelve) hours. 30 each 0  . dronabinol (MARINOL) 2.5 MG capsule Take  1 capsule (2.5 mg total) by mouth 3 (three) times daily. 90 capsule 2  . magnesium oxide (MAG-OX) 400 (241.3 MG) MG tablet Take 0.5 tablets (200 mg total) by mouth daily.    . Multiple Vitamin (MULTI-VITAMIN PO) Take 1 tablet by mouth daily.    . Nutritional Supplements (FEEDING SUPPLEMENT, JEVITY 1.2 CAL,) LIQD Place 1,000 mLs into feeding tube daily. Run for 12 hours out of each 24 1000 mL 6  . simvastatin (ZOCOR) 20 MG tablet Take 1 tablet (20 mg total) by mouth daily. (Patient taking differently: Take  20 mg by mouth daily at 6 PM. ) 30 tablet 0  . tolterodine (DETROL LA) 4 MG 24 hr capsule Take 4 mg by mouth 2 (two) times daily.    . vancomycin (VANCOCIN) 50 mg/mL oral solution Take 2.5 mLs (125 mg total) by mouth 4 (four) times daily. 300 mL 5  . Water For Irrigation, Sterile (FREE WATER) SOLN Place 200 mLs into feeding tube 4 (four) times daily.       ROS:                                                                                                                                       History obtained from the patient and and daughters  General ROS: negative for - chills, fatigue, fever, night sweats, weight gain or weight loss Psychological ROS: negative for - behavioral disorder, hallucinations, memory difficulties, mood swings or suicidal ideation Ophthalmic ROS: negative for - blurry vision, double vision, eye pain or loss of vision ENT ROS: negative for - epistaxis, nasal discharge, oral lesions, sore throat, tinnitus or vertigo Allergy and Immunology ROS: negative for - hives or itchy/watery eyes Hematological and Lymphatic ROS: negative for - bleeding problems, bruising or swollen lymph nodes Endocrine ROS: negative for - galactorrhea, hair pattern changes, polydipsia/polyuria or temperature intolerance Respiratory ROS: negative for - cough, hemoptysis, shortness of breath or wheezing Cardiovascular ROS: negative for - chest pain, dyspnea on exertion, edema or irregular heartbeat Gastrointestinal ROS: negative for - abdominal pain, diarrhea, hematemesis, nausea/vomiting or stool incontinence Genito-Urinary ROS: negative for - dysuria, hematuria, incontinence or urinary frequency/urgency Musculoskeletal ROS: negative for - joint swelling or muscular weakness Neurological ROS: as noted in HPI Dermatological ROS: negative for rash and skin lesion changes  Neurologic Examination:  Blood pressure 161/80, pulse 63, temperature 98.1 F (36.7 C), temperature source Oral, resp. rate 20, weight 96.163 kg (212 lb), SpO2 100 %.  HEENT-  Normocephalic, no lesions, without obvious abnormality.  Normal external eye and conjunctiva.  Normal TM's bilaterally.  Normal auditory canals and external ears. Normal external nose, mucus membranes and septum.  Normal pharynx. Cardiovascular- S1, S2 normal, pulses palpable throughout   Lungs- chest clear, no wheezing, rales, normal symmetric air entry Abdomen- normal findings: bowel sounds normal Extremities- no edema Lymph-no adenopathy palpable Musculoskeletal-has pain when right knee is touched and with movement.  Skin-warm and dry, no hyperpigmentation, vitiligo, or suspicious lesions  Neurological Examination Mental Status: Alert, oriented to hospital but was unable to state the year or month.  Speech fluent without evidence of aphasia.  Able to follow 3 step commands without difficulty. Cranial Nerves: II: Discs flat bilaterally; Visual fields grossly normal, pupils equal, round, reactive to light and accommodation III,IV, VI: ptosis not present, extra-ocular motions intact bilaterally V,VII: smile symmetric, facial light touch sensation normal bilaterally VIII: hearing normal bilaterally IX,X: uvula rises symmetrically XI: bilateral shoulder shrug XII: midline tongue extension Motor: Right : Upper extremity   5/5    Left:     Upper extremity   5/5  Lower extremity   4/5 (note)    Lower extremity   5/5 --exam limited on the right leg due to pain.  When he gave full strength hip flexion was 4/5, knee extension+/5, knee flexion 4/5, DF and PF 5/5 Tone and bulk:normal tone throughout; no atrophy noted Sensory: Pinprick and light touch intact throughout, bilaterally but does have decreased temperature and vibratory sensation in bilateral feet.  Deep Tendon Reflexes: 2+ and symmetric throughout UE, left KJ and bilateral AJ. Decreased  right KJ but also had a TKA. Plantars: Right: downgoing   Left: downgoing Cerebellar: normal finger-to-nose,normal heel-to-shin test with left leg and unable to obtain on the right.  Gait: not tested due to safety.        Lab Results: Basic Metabolic Panel:  Recent Labs Lab 07/25/14 1205  NA 139  K 4.1  CL 104  CO2 26  GLUCOSE 97  BUN 37*  CREATININE 1.17  CALCIUM 9.3    Liver Function Tests: No results for input(s): AST, ALT, ALKPHOS, BILITOT, PROT, ALBUMIN in the last 168 hours. No results for input(s): LIPASE, AMYLASE in the last 168 hours. No results for input(s): AMMONIA in the last 168 hours.  CBC:  Recent Labs Lab 07/25/14 1205  WBC 4.9  HGB 10.7*  HCT 33.1*  MCV 87.3  PLT 120*    Cardiac Enzymes: No results for input(s): CKTOTAL, CKMB, CKMBINDEX, TROPONINI in the last 168 hours.  Lipid Panel: No results for input(s): CHOL, TRIG, HDL, CHOLHDL, VLDL, LDLCALC in the last 168 hours.  CBG:  Recent Labs Lab 07/18/14 1720 07/18/14 1959 07/19/14 0050 07/19/14 1138 07/19/14 1701  GLUCAP 88 101* 97 95 92    Microbiology: Results for orders placed or performed during the hospital encounter of 07/15/14  Culture, blood (routine x 2)     Status: None   Collection Time: 07/15/14 11:08 PM  Result Value Ref Range Status   Specimen Description BLOOD RIGHT ARM  Final   Special Requests BOTTLES DRAWN AEROBIC AND ANAEROBIC 5CC  Final   Culture   Final    NO GROWTH 5 DAYS Performed at [facility]    Report Status 07/22/2014 FINAL  Final  Culture, blood (routine x  2)     Status: None   Collection Time: 07/16/14 12:22 AM  Result Value Ref Range Status   Specimen Description BLOOD RIGHT ARM  Final   Special Requests BOTTLES DRAWN AEROBIC AND ANAEROBIC 5CC  Final   Culture   Final    NO GROWTH 5 DAYS Performed at [facility]    Report Status 07/22/2014 FINAL  Final  MRSA PCR Screening     Status: Abnormal   Collection Time: 07/16/14   4:49 AM  Result Value Ref Range Status   MRSA by PCR POSITIVE (A) NEGATIVE Final    Comment:        The GeneXpert MRSA Assay (FDA approved for NASAL specimens only), is one component of a comprehensive MRSA colonization surveillance program. It is not intended to diagnose MRSA infection nor to guide or monitor treatment for MRSA infections. RESULT CALLED TO, READ BACK BY AND VERIFIED WITH: RN,MIKKI POTTER [id number] @0725  THANEY   Urine culture     Status: None   Collection Time: 07/16/14  6:48 AM  Result Value Ref Range Status   Specimen Description URINE, RANDOM  Final   Special Requests NONE  Final   Colony Count NO GROWTH Performed at [facility]   Final   Culture NO GROWTH Performed at [facility]   Final   Report Status 07/17/2014 FINAL  Final  Clostridium Difficile by PCR     Status: None   Collection Time: 07/17/14 12:07 PM  Result Value Ref Range Status   C difficile by pcr NEGATIVE NEGATIVE Final    Coagulation Studies: No results for input(s): LABPROT, INR in the last 72 hours.  Imaging: Dg Chest 2 View  07/25/2014   CLINICAL DATA:  81 year old male with progressive weakness. Subsequent encounter.  EXAM: CHEST  2 VIEW  COMPARISON:  07/15/2014.  FINDINGS: Left PICC line tip has been retracted slightly with tip at the level of the proximal superior vena cava and directed slightly laterally rather than inferiorly.  Post endovascular stenting descending thoracic aorta. Calcified ectatic ascending thoracic aorta and aortic arch.  Cardiomegaly.  Pulmonary vascular prominence most notable centrally.  Elevated left hemidiaphragm with gas-filled bowel below diaphragm. Left base subsegmental atelectasis.  No pneumothorax.  Shoulder joint degenerative changes.  IMPRESSION: Cardiomegaly  Pulmonary vascular prominence most notable centrally  Elevated left hemidiaphragm with left base subsegmental atelectasis.  Slight shifting position of the left PICC line as  detailed above.  Post endovascular stent placement descending thoracic aorta with calcified ectatic ascending thoracic aorta and aortic arch.   Electronically Signed   By: Genia Del M.D.   On: 07/25/2014 12:59   Ct Head Wo Contrast  07/25/2014   CLINICAL DATA:  Progressive weakness for couple weeks, loss of strength in lower extremities, increased weakness RIGHT leg, hypertension, cardiomyopathy  EXAM: CT HEAD WITHOUT CONTRAST  TECHNIQUE: Contiguous axial images were obtained from the base of the skull through the vertex without intravenous contrast.  COMPARISON:  11/12/2010  FINDINGS: Generalized atrophy.  Cavum septum pellucidum and vergae.  Otherwise normal ventricular morphology.  No midline shift or mass effect.  Old lacunar infarct LEFT external capsule.  Parafalcine high attenuation at posterior LEFT falx compatible with subdural hematoma measuring up to 8 mm thick.  No intraparenchymal hemorrhage, mass lesion or evidence acute infarction.  Visualized paranasal sinuses and mastoid air cells clear.  No acute osseous findings.  IMPRESSION: Subdural hematoma along the posterior LEFT falx up to 8 mm thick.  No intraparenchymal brain abnormalities.  Old lacunar infarct LEFT external capsule.  Findings called to [facility] on 07/25/2014 at 1258 hours.   Electronically Signed   By: Lavonia Dana M.D.   On: 07/25/2014 12:58       Assessment and plan discussed with with attending physician and they are in agreement.    Etta Quill PA-C [facility] [phone]  07/25/2014, 4:18 PM   Assessment: 81 y.o. male with right leg weakness. The SDH is in the correct location for this, but the duration from his fall is odd. I would favor MRI to ensure that this is not some other hypercellular fluid rather than blood(e.g. Abcess) given his recent problems with infections. For similar reasons, would image his T and L spine to rule out epidural process.   Avoid antiplatelet and anticoagulants.    Stroke Risk Factors - hyperlipidemia and hypertension  1) MRI brain, T-spine, L-spine.  2) d/c ASA 3) will continue to follow.   Roland Rack, MD [facility] [phone]  If 7pm- 7am, please page neurology on call as listed in [place].

## 2014-07-26 ENCOUNTER — Inpatient Hospital Stay (HOSPITAL_COMMUNITY): Payer: Medicare Other

## 2014-07-26 DIAGNOSIS — I609 Nontraumatic subarachnoid hemorrhage, unspecified: Secondary | ICD-10-CM

## 2014-07-26 DIAGNOSIS — E440 Moderate protein-calorie malnutrition: Secondary | ICD-10-CM | POA: Insufficient documentation

## 2014-07-26 LAB — CBC
HEMATOCRIT: 32.5 % — AB (ref 39.0–52.0)
Hemoglobin: 10.4 g/dL — ABNORMAL LOW (ref 13.0–17.0)
MCH: 28 pg (ref 26.0–34.0)
MCHC: 32 g/dL (ref 30.0–36.0)
MCV: 87.6 fL (ref 78.0–100.0)
PLATELETS: 114 10*3/uL — AB (ref 150–400)
RBC: 3.71 MIL/uL — ABNORMAL LOW (ref 4.22–5.81)
RDW: 16.7 % — ABNORMAL HIGH (ref 11.5–15.5)
WBC: 4.1 10*3/uL (ref 4.0–10.5)

## 2014-07-26 LAB — BASIC METABOLIC PANEL
Anion gap: 8 (ref 5–15)
BUN: 34 mg/dL — AB (ref 6–20)
CALCIUM: 9.4 mg/dL (ref 8.9–10.3)
CO2: 26 mmol/L (ref 22–32)
Chloride: 104 mmol/L (ref 101–111)
Creatinine, Ser: 1.14 mg/dL (ref 0.61–1.24)
GFR calc Af Amer: >60 mL/min (ref >60)
GFR calc non Af Amer: 58 mL/min — ABNORMAL LOW (ref >60)
Glucose, Bld: 104 mg/dL — ABNORMAL HIGH (ref 65–99)
Potassium: 3.5 mmol/L (ref 3.5–5.1)
Sodium: 138 mmol/L (ref 135–145)

## 2014-07-26 NOTE — Consult Note (Signed)
   [facility] CM Inpatient Consult   07/26/2014  [name] Feb 18, 1930 [id number]   Came to bedside to speak with him about [facility] services. He refuses stating he has all he needs at home. Declines even accepting a [facility] Care Management brochure and for writer to contact daughter to discuss. Will follow up with MD and Primary Care MD regarding patient's refusal. Will make inpatient RNCM aware.  Marthenia Rolling, MSN-Ed, RN,BSN [facility] Liaison [phone]

## 2014-07-26 NOTE — Progress Notes (Signed)
Subjective: Patient continues to have right leg weakness.   Exam: Filed Vitals:   07/26/14 0646  BP: 141/76  Pulse: 65  Temp: 97.7 F (36.5 C)  Resp: 18    HEENT-  Normocephalic, no lesions, without obvious abnormality.  Normal external eye and conjunctiva.  Normal TM's bilaterally.  Normal auditory canals and external ears. Normal external nose, mucus membranes and septum.  Normal pharynx. Cardiovascular- S1, S2 normal, pulses palpable throughout   Lungs- chest clear, no wheezing, rales, normal symmetric air entry Abdomen- normal findings: bowel sounds normal Extremities- no edema Lymph-no adenopathy palpable Musculoskeletal-no joint tenderness, deformity or swelling Skin-warm and dry, no hyperpigmentation, vitiligo, or suspicious lesions    Gen: In bed, NAD MS: Alert and oriented. Becomes agitated when asked to move leg. Speech clear with no aphasia.  OA:CZYSAY, EOMI, TML, Face equal and symmetrical Motor: bilateral UE 5/5, left LE 5/5 right LE shows 4/5 strength with hip flexion and knee flexion and 5/5 with knee extension and PF/DF.  When asked to move leg he states he cannot but when not paying attention he will move the his leg with good strength. This was noted not only on exam but also with PT. Becomes angy when pushed to move his leg and then refuses.  Sensory: intact with bilateral stocking distribution decreased temperature and vibration.  DTR: 2+ and symmetric throughout UE, left KJ and bilateral AJ. Decreased right KJ but also had a TKA.  Pertinent Labs: none    Impression: 81 y.o. male with right leg weakness. The SDH is in the correct location for this, but the duration from his fall is odd. Will obtain MRI to ensure that this is not some other hypercellular fluid rather than blood(e.g. Abcess) given his recent problems with infections. He will need MRI under sedation and this will not be done until Monday due to scheduling.   Recommend: 1) MRI brain, T and L  spine under sedation 2) will follow up when MRI is done.   07/26/2014, 10:07 AM Etta Quill PA-C [facility] [phone]   I have seen and evaluated the patient. I have reviewed the above note. Awaiting MRI, will follow up when completed. Please call in the interim for any concerns.   Roland Rack, MD [facility] [phone]  If 7pm- 7am, please page neurology on call as listed in [place].

## 2014-07-26 NOTE — Evaluation (Signed)
Physical Therapy Evaluation Patient Details Name: [name] MRN: [id number] DOB: Jan 15, 1930 Today's Date: 07/26/2014   History of Present Illness  pt presents with recent fall sustaining SDH and now R LE weakness.  pt with hx of Aortic Graft Infection, HTN, IVC Filter, and G-tube.    Clinical Impression  Pt demonstrates some weakness in R LE, but difficult to assess due to inconsistent participation.  Attempted to encourage pt to try to stand, but pt adamantly refusing and began yelling at PT when for suggesting trying to stand.  At this point feel pt will need SNF level of care at D/C, but unsure if pt will be agreeable.  Will continue to follow.      Follow Up Recommendations SNF    Equipment Recommendations  None recommended by PT    Recommendations for Other Services       Precautions / Restrictions Precautions Precautions: Fall Precaution Comments: G-tube Restrictions Weight Bearing Restrictions: No      Mobility  Bed Mobility Overal bed mobility: Needs Assistance Bed Mobility: Supine to Sit;Sit to Supine;Rolling Rolling: Min assist   Supine to sit: Mod assist;HOB elevated Sit to supine: Min assist   General bed mobility comments: pt able to roll with only MinA for peri hygiene.  pt needs A with bringing R LE to EOB and bringing trunk up to sitting.  With return to bed pt only needed A with R LE.  pt was able to lift R LE on his own about half way back to bed, then started yelling at PT to do it.    Transfers                 General transfer comment: pt adamantly refusing to attempt to stand.    Ambulation/Gait                Stairs            Wheelchair Mobility    Modified Rankin (Stroke Patients Only)       Balance Overall balance assessment: Needs assistance Sitting-balance support: Single extremity supported;Feet supported Sitting balance-Leahy Scale: Poor                                       Pertinent  Vitals/Pain Pain Assessment: Faces Faces Pain Scale: Hurts little more Pain Location: pt indicates R shoulder blade is sore, but does not rate when asked.   Pain Descriptors / Indicators: Sore Pain Intervention(s): Monitored during session;Repositioned    Home Living Family/patient expects to be discharged to:: Unsure Living Arrangements: Spouse/significant other Available Help at Discharge: Family;Available 24 hours/day Type of Home: House Home Access: Stairs to enter Entrance Stairs-Rails: None Entrance Stairs-Number of Steps: 1 Home Layout: One level Home Equipment: Walker - 2 wheels;Cane - single point;Shower seat;Wheelchair - manual Additional Comments: Unsure if pt/family would be agreeable to SNF.      Prior Function Level of Independence: Needs assistance   Gait / Transfers Assistance Needed: States he ambulates with cane and RW at home  ADL's / Homemaking Assistance Needed: States he can bath/dress himself    Comments: PLOF is prior to falling.  Since fall pt has had very limited mobility and family have been providing increased care.       Hand Dominance   Dominant Hand: Right    Extremity/Trunk Assessment   Upper Extremity Assessment: Defer to OT evaluation  Lower Extremity Assessment: Generalized weakness;RLE deficits/detail RLE Deficits / Details: Difficult to get pt to participate in MMT, but seems to be able to move R LE minimally against gravity.  No changes in sensation.      Cervical / Trunk Assessment: Kyphotic  Communication   Communication: HOH  Cognition Arousal/Alertness: Awake/alert Behavior During Therapy: WFL for tasks assessed/performed Overall Cognitive Status: Within Functional Limits for tasks assessed                      General Comments      Exercises        Assessment/Plan    PT Assessment Patient needs continued PT services  PT Diagnosis Difficulty walking;Generalized weakness   PT Problem List  Decreased strength;Decreased activity tolerance;Decreased balance;Decreased mobility;Decreased coordination;Decreased knowledge of use of DME  PT Treatment Interventions DME instruction;Gait training;Functional mobility training;Therapeutic activities;Therapeutic exercise;Balance training;Neuromuscular re-education;Patient/family education   PT Goals (Current goals can be found in the Care Plan section) Acute Rehab PT Goals Patient Stated Goal: Get stronger. PT Goal Formulation: With patient Time For Goal Achievement: 08/09/14 Potential to Achieve Goals: Fair    Frequency Min 2X/week   Barriers to discharge        Co-evaluation               End of Session Equipment Utilized During Treatment: Gait belt Activity Tolerance: Patient limited by fatigue Patient left: in bed;with call bell/phone within reach;with bed alarm set;with family/visitor present Nurse Communication: Mobility status         Time: 0930-1009 PT Time Calculation (min) (ACUTE ONLY): 39 min   Charges:   PT Evaluation $Initial PT Evaluation Tier I: 1 Procedure PT Treatments $Therapeutic Activity: 23-37 mins   PT G CodesCatarina Hartshorn, [place] [phone] 07/26/2014, 12:14 PM

## 2014-07-26 NOTE — Clinical Social Work Placement (Signed)
   CLINICAL SOCIAL WORK PLACEMENT  NOTE  Date:  07/26/2014  Patient Details  Name: [name] MRN: [id number] Date of Birth: Feb 20, 1930  Clinical Social Work is seeking post-discharge placement for this patient at the [place] level of care (*CSW will initial, date and re-position this form in  chart as items are completed):  Yes   Patient/family provided with [facility] Work Department's list of facilities offering this level of care within the geographic area requested by the patient (or if unable, by the patient's family).  Yes   Patient/family informed of their freedom to choose among providers that offer the needed level of care, that participate in Medicare, Medicaid or managed care program needed by the patient, have an available bed and are willing to accept the patient.  Yes   Patient/family informed of [facility]'s ownership interest in [facility] and [facility], as well as of the fact that they are under no obligation to receive care at these facilities.  PASRR submitted to EDS on       PASRR number received on       Existing PASRR number confirmed on 07/26/14     FL2 transmitted to all facilities in geographic area requested by pt/family on       FL2 transmitted to all facilities within larger geographic area on       Patient informed that his/her managed care company has contracts with or will negotiate with certain facilities, including the following:            Patient/family informed of bed offers received.  Patient chooses bed at       Physician recommends and patient chooses bed at      Patient to be transferred to   on  .  Patient to be transferred to facility by       Patient family notified on   of transfer.  Name of family member notified:        PHYSICIAN       Additional Comment:    _______________________________________________ Greta Doom, LCSW 07/26/2014, 2:40 PM

## 2014-07-26 NOTE — Progress Notes (Signed)
[facility] PROGRESS NOTE  [name] VQM:[id number] DOB: 09/03/1929 DOA: 07/25/2014 PCP: [name], MD  Assessment/Plan: Subdural hematoma with associated acute right-sided weakness -Appreciate previous neurosurgery/Dr. Vertell Limber input; no acute surgical issues elucidated -Patient did have fall 2 weeks ago prior to previous admission and unclear if this is a delayed response traumatic subdural or spontaneous given patient also has chronic thrombocytopenia and platelets at times do fall below 100,000 -Repeat CT head non contrasted today.  -No pharmacological DVT prophylaxis -neurology recommending MRI brain and T, L spine.    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/History of Clostridium difficile colitis -ID physician consulted by ER staff -Continue preadmission IV cefepime and oral vancomycin -PICC line dressing due to be changed today and question of change in positioning of PICC line based on chest x-ray/IV team to evaluate    Protein-calorie malnutrition, severe/Status post insertion PEG tube/FTT in adult -Continue twice a day bolus feeds during the day and continuous tube feedings and 100 mL per hour from 6 PM to 6 AM -Continue free water as prior to admission -Continue Protostat as prior to admission -Continue oral clear liquids with Gatorade preferred as prior to admission -Continue Marinol as prior to admission   Cardiomyopathy-EF 35-40% by echo Jan 2016 -Compensated without any signs of heart failure exacerbation   HTN  -Blood pressure controlled   Chronic Thrombocytopenia -Platelets greater than 100,000 and at baseline   History of DVT of lower extremity (Feb 2015) -post IVC filter   Stage III chronic kidney disease -No function stable and at baseline  Code Status: Partial Code.  Family Communication: none at bedside.  Disposition Plan: Remain inpatient     Consultants:  neurology  Procedures:  none  Antibiotics:  Cefepime  Oral vancomycin  HPI/Subjective: Complaining of headaches.   Objective: Filed Vitals:   07/26/14 1045  BP: 136/74  Pulse: 70  Temp: 98.1 F (36.7 C)  Resp: 16    Intake/Output Summary (Last 24 hours) at 07/26/14 1143 Last data filed at 07/26/14 1110  Gross per 24 hour  Intake      0 ml  Output   1200 ml  Net  -1200 ml   Filed Weights   07/25/14 1120  Weight: 96.163 kg (212 lb)    Exam:   General:  Alert in no distress.   Cardiovascular: S 1, S 2 RRR  Respiratory: CTA  Abdomen: Peg tube place, Bs present, soft.   Musculoskeletal: *no edema  Neuro; alert following commands. Right lower extremity weakness.   Data Reviewed: Basic Metabolic Panel:  Recent Labs Lab 07/25/14 1205 07/26/14 0400  NA 139 138  K 4.1 3.5  CL 104 104  CO2 26 26  GLUCOSE 97 104*  BUN 37* 34*  CREATININE 1.17 1.14  CALCIUM 9.3 9.4   Liver Function Tests: No results for input(s): AST, ALT, ALKPHOS, BILITOT, PROT, ALBUMIN in the last 168 hours. No results for input(s): LIPASE, AMYLASE in the last 168 hours. No results for input(s): AMMONIA in the last 168 hours. CBC:  Recent Labs Lab 07/25/14 1205 07/26/14 0400  WBC 4.9 4.1  HGB 10.7* 10.4*  HCT 33.1* 32.5*  MCV 87.3 87.6  PLT 120* 114*   Cardiac Enzymes: No results for input(s): CKTOTAL, CKMB, CKMBINDEX, TROPONINI in the last 168 hours. BNP (last 3 results)  Recent Labs  04/09/14 1918  BNP 1434.7*    ProBNP (last 3 results) No results for input(s): PROBNP in the last  8760 hours.  CBG:  Recent Labs Lab 07/19/14 1701  GLUCAP 92    Recent Results (from the past 240 hour(s))  Clostridium Difficile by PCR     Status: None   Collection Time: 07/17/14 12:07 PM  Result Value Ref Range Status   C difficile by pcr NEGATIVE NEGATIVE Final     Studies: Dg Chest 2 View  07/25/2014   CLINICAL DATA:  81 year old male with  progressive weakness. Subsequent encounter.  EXAM: CHEST  2 VIEW  COMPARISON:  07/15/2014.  FINDINGS: Left PICC line tip has been retracted slightly with tip at the level of the proximal superior vena cava and directed slightly laterally rather than inferiorly.  Post endovascular stenting descending thoracic aorta. Calcified ectatic ascending thoracic aorta and aortic arch.  Cardiomegaly.  Pulmonary vascular prominence most notable centrally.  Elevated left hemidiaphragm with gas-filled bowel below diaphragm. Left base subsegmental atelectasis.  No pneumothorax.  Shoulder joint degenerative changes.  IMPRESSION: Cardiomegaly  Pulmonary vascular prominence most notable centrally  Elevated left hemidiaphragm with left base subsegmental atelectasis.  Slight shifting position of the left PICC line as detailed above.  Post endovascular stent placement descending thoracic aorta with calcified ectatic ascending thoracic aorta and aortic arch.   Electronically Signed   By: Genia Del M.D.   On: 07/25/2014 12:59   Ct Head Wo Contrast  07/25/2014   CLINICAL DATA:  Progressive weakness for couple weeks, loss of strength in lower extremities, increased weakness RIGHT leg, hypertension, cardiomyopathy  EXAM: CT HEAD WITHOUT CONTRAST  TECHNIQUE: Contiguous axial images were obtained from the base of the skull through the vertex without intravenous contrast.  COMPARISON:  11/12/2010  FINDINGS: Generalized atrophy.  Cavum septum pellucidum and vergae.  Otherwise normal ventricular morphology.  No midline shift or mass effect.  Old lacunar infarct LEFT external capsule.  Parafalcine high attenuation at posterior LEFT falx compatible with subdural hematoma measuring up to 8 mm thick.  No intraparenchymal hemorrhage, mass lesion or evidence acute infarction.  Visualized paranasal sinuses and mastoid air cells clear.  No acute osseous findings.  IMPRESSION: Subdural hematoma along the posterior LEFT falx up to 8 mm thick.  No  intraparenchymal brain abnormalities.  Old lacunar infarct LEFT external capsule.  Findings called to Hyman Bible on 07/25/2014 at 1258 hours.   Electronically Signed   By: Lavonia Dana M.D.   On: 07/25/2014 12:58    Scheduled Meds: . ceFEPIme (MAXIPIME) 2 GM IVP  2 g Intravenous Q12H  . dronabinol  2.5 mg Oral TID  . feeding supplement (JEVITY 1.2 CAL)  237 mL Per Tube BID WC  . feeding supplement (PRO-STAT SUGAR FREE 64)  30 mL Per Tube TID  . fesoterodine  4 mg Oral Daily  . free water  200 mL Per Tube QID  . LORazepam  1 mg Intravenous Once  . magnesium oxide  200 mg Oral Daily  . simvastatin  20 mg Oral q1800  . sodium chloride  3 mL Intravenous Q12H  . vancomycin  125 mg Oral QID   Continuous Infusions: . feeding supplement (JEVITY 1.2 CAL) 1,000 mL (07/25/14 2043)    Principal Problem:   Subdural hematoma Active Problems:   HTN (hypertension)   Chronic Thrombocytopenia   History of DVT of lower extremity (Feb 2015) -post IVC filter   Protein-calorie malnutrition, severe   History of Bacteremia due to Pseudomonas   Stage III chronic kidney disease   Status post insertion of percutaneous endoscopic gastrostomy (PEG) tube  FTT (failure to thrive) in adult   S/P AAA repair 2014 with chronic graft infection   Cardiomyopathy-EF 35-40% by echo Jan 2016   History of Clostridium difficile colitis   Acute right-sided weakness   Subarachnoid hemorrhage    Time spent: 35 minutes.     Niel Hummer A  [facility] Pager [phone]. If 7PM-7AM, please contact night-coverage at [email or web address], password [facility] 07/26/2014, 11:43 AM  LOS: 1 day

## 2014-07-26 NOTE — Clinical Social Work Note (Signed)
Clinical Social Work Assessment  Patient Details  Name: [name] MRN: [id number] Date of Birth: 1929-12-29  Date of referral:  07/26/14               Reason for consult:  Facility Placement              Housing/Transportation Living arrangements for the past 2 months:  Single Family Home Source of Information:  Patient, Adult Children Patient Interpreter Needed:  None Criminal Activity/Legal Involvement Pertinent to Current Situation/Hospitalization:  No - Comment as needed Significant Relationships:  Spouse, Adult Children Lives with:  Spouse Do you feel safe going back to the place where you live?  Yes Need for family participation in patient care:  Yes (Comment)  Care giving concerns:  None   Social Worker assessment / plan:  CSW met the pt and pt's daughter [name] the bedside. CSW introduced self and purpose of the visit. CSW discussed SNF rehab. Pt become upset and reported that he will not go to rehab. Pt reported that he has been to several rehab places in the past. [name] attempted to calm the pt down. [name] started to negotiate with the pt about rehab. [name] reported the pt like [facility], but did a bad experience with two other SNFs.  CSW answered all questions in which the [place] inquired about. CSW will continue to follow this pt and assist with discharge as needed.   PT Recommendations:  [place] / Referral to community resources:  [place]  Patient/Family's Response to care:  Pt reported being upset and not wishing to talk about the care he is receiving.   Patient/Family's Understanding of and Emotional Response to Diagnosis, Current Treatment, and Prognosis:  Pt is angry. Pt blames the SNFs for his current medical condition.   Emotional Assessment Appearance:  Appears stated age Attitude/Demeanor/Rapport:  Angry, Irrational Affect (typically observed):  Frustrated, Irritable Orientation:  Oriented to Self, Oriented  to Place, Oriented to  Time, Oriented to Situation Alcohol / Substance use:  Not Applicable Psych involvement (Current and /or in the community):  No (Comment)  Discharge Needs  Concerns to be addressed:  Denies Needs/Concerns at this time Barriers to Discharge:  No Barriers Identified   [place], MSW, [place]

## 2014-07-26 NOTE — Evaluation (Signed)
Occupational Therapy Evaluation Patient Details Name: [name] MRN: [id number] DOB: 09/11/1929 Today's Date: 07/26/2014    History of Present Illness pt presents with recent fall sustaining SDH and now R LE weakness.  pt with hx of Aortic Graft Infection, HTN, IVC Filter, and G-tube.     Clinical Impression   PT admitted with SDH s/p fall with R side weakness. Pt currently with functional limitiations due to the deficits listed below (see OT problem list). PT from home with fall and declines oob at this time. Ot recommending SNF due to inability to fully assess safe return home. Pt will benefit from skilled OT to increase their independence and safety with adls and balance to allow discharge SNF. Pt fall risk and requires (A) for adls per patient report.      Follow Up Recommendations  SNF    Equipment Recommendations  None recommended by OT    Recommendations for Other Services       Precautions / Restrictions Precautions Precautions: Fall Precaution Comments: G-tube Restrictions Weight Bearing Restrictions: No      Mobility Bed Mobility Overal bed mobility: Needs Assistance Bed Mobility: Rolling Rolling: Min guard   Supine to sit: Mod assist;HOB elevated Sit to supine: Min assist   General bed mobility comments: pt required use of bed rails   Transfers                 General transfer comment: pt admantly declines.    Balance Overall balance assessment: Needs assistance Sitting-balance support: Single extremity supported;Feet supported Sitting balance-Leahy Scale: Poor                                      ADL                                         General ADL Comments: Pt spine in bed on arrival and answer questions. pt pleasant during session and willing to allow OT to do a gross assessment with pt doing a functional task. tp able to touch back of his head without pain reported but states "R shoulder hurts"  Pt state "it doesnt hurt there its in my shoulder" OT was unable to get patient to clearly define pain but no functional deficits noted with [facility] hand touching back of head. Pt reports "R foot feels cold" but declines blankets stating "it doesnt do anything" Pt states "they say MRI is monday then Tuesday so they should just keep me here til Christmas" Pt log rolling in bed using rail but declined OOB. pt reports having therapy 2 days a week - MWF. OT repeating this statement "you see a therapist MWF but its only two days a week?" trying to allow patient time to correct error. Pt states "yep thats right" Pt states "nurse comes too"      [place]?: Vision impaired- to be further tested in functional context Additional Comments: Pt unable to read clock , clock brought closer to patient and able to read 1:55 which is greater than 72 font but unable to read day month year on digital clock. Pt states "can't see it"   Perception     Praxis      Pertinent Vitals/Pain Pain Assessment: Faces Faces Pain Scale: Hurts even more Pain Location: R shoulder blade  hurts and R foot is cold Pain Descriptors / Indicators: Sore Pain Intervention(s): Monitored during session     Hand Dominance Right   Extremity/Trunk Assessment Upper Extremity Assessment Upper Extremity Assessment: RUE deficits/detail;LUE deficits/detail RUE Deficits / Details: Reports pain R shoulder but able to range [facility] [place] and puts hand behind head and states "there now i can take a nap" LUE Deficits / Details: WFL - full ROM   Lower Extremity Assessment Lower Extremity Assessment: RLE deficits/detail RLE Deficits / Details: Pt reports his foot is cold. Pt reports "its just that foot" Pt demonstrates hip abduction, knee flexion and plantar flexion during session. difficult to fully assess RLE Coordination: decreased gross motor;decreased fine motor   Cervical / Trunk Assessment Cervical / Trunk Assessment: Kyphotic    Communication Communication Communication: HOH   Cognition Arousal/Alertness: Awake/alert Behavior During Therapy: Flat affect Overall Cognitive Status: No family/caregiver present to determine baseline cognitive functioning Area of Impairment: Safety/judgement         Safety/Judgement: Decreased awareness of deficits     General Comments: Pt accurately reports "i was here last week and they are doing an MRI monday" however patietn could not report the month  or day of the week. pt unable to answer several questions and reports "i have no idea"   General Comments       Exercises       Shoulder Instructions      Home Living Family/patient expects to be discharged to:: Private residence Living Arrangements: Spouse/significant other Available Help at Discharge: Family;Available 24 hours/day Type of Home: House Home Access: Stairs to enter [employer] of Steps: 1 Entrance Stairs-Rails: None Home Layout: One level     Bathroom Shower/Tub: Teacher, early years/pre: Standard     Home Equipment: Environmental consultant - 2 wheels;Cane - single point;Shower seat;Wheelchair - manual   Additional Comments: pt reports wife walks with a cane and is 81 yo and so he has to be able to walk by himself      Prior Functioning/Environment Level of Independence: Needs assistance  Gait / Transfers Assistance Needed: States he ambulates with cane and RW at home ADL's / Homemaking Assistance Needed: reports son helps him get into the tub on a seat and wraps plastic around picc line. "i will die if this comes out" Communication / Swallowing Assistance Needed: Wears hearing aids Comments: PLOF is prior to falling.  Since fall pt has had very limited mobility and family have been providing increased care.      OT Diagnosis: Cognitive deficits;Generalized weakness;Acute pain   OT Problem List: Decreased strength;Decreased range of motion   OT Treatment/Interventions: Self-care/ADL  training;Therapeutic exercise;DME and/or AE instruction;Therapeutic activities;Patient/family education;Balance training    OT Goals(Current goals can be found in the care plan section) Acute Rehab OT Goals Patient Stated Goal: TO TAKE A NAP OT Goal Formulation: Patient unable to participate in goal setting Time For Goal Achievement: 08/09/14 Potential to Achieve Goals: Good  OT Frequency: Min 2X/week   Barriers to D/C:            Co-evaluation              End of Session Nurse Communication: Mobility status;Precautions  Activity Tolerance: Patient tolerated treatment well Patient left: in bed;with call bell/phone within reach;with bed alarm set   Time: 1330-1406 OT Time Calculation (min): 36 min Charges:  OT General Charges $OT Visit: 1 Procedure OT Evaluation $Initial OT Evaluation Tier I: 1 Procedure OT Treatments $Self Care/Home  Management : 8-22 mins G-Codes:    Parke Poisson B 08-03-14, 2:41 PM  Pager: [phone]

## 2014-07-26 NOTE — Progress Notes (Signed)
UR COMPLETED  

## 2014-07-26 NOTE — Progress Notes (Signed)
Initial Nutrition Assessment  DOCUMENTATION CODES:  Non-severe (moderate) malnutrition in context of chronic illness  INTERVENTION:  Tube feeding, Prostat  Jevity 1.2 @ 100 ml/hr for 12 hours (6 pm to 6 am) and 1 can Jevity 1.2 BID along with Pro-stat TID provides 2308 kcal and 138 grams  NUTRITION DIAGNOSIS:  Inadequate oral intake related to dysphagia as evidenced by other (see comment) (PEG dependence).   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 The patient had a mechanical fall proximately 2 weeks ago resulting in some thoracic back pain and right shoulder pain.   Pt on a clear liquid diet. Nurse tech states pt didn't consume anything at breakfast. Pt states that he doesn't eat anything by mouth, tube feedings only, and he is unable to report why. He is unable to provide much history. He denies any nausea, abdominal pain, diarrhea or constipation. Per weight history, pt's weight has been stable. Per physical exam pt has moderate and severe muscle wasting and mild wasting of fat mass.  Current TF regimen: Jevity 1.2 @ 100 ml/hr for 12 hours (6 pm to 6 am) and 1 can Jevity 1.2 BID along with Pro-stat TID provides 2308 kcal and 138 grams of protein which meets 100% of estimated energy needs and 102% of estimated protein needs.   Labs reviewed.   Height:  Ht Readings from Last 1 Encounters:  07/25/14 6\' 2"  (1.88 m)    Weight:  Wt Readings from Last 1 Encounters:  07/25/14 212 lb (96.163 kg)    Ideal Body  Weight:  86.3 kg  Wt Readings from Last 10 Encounters:  07/25/14 212 lb (96.163 kg)  07/19/14 212 lb 4.9 oz (96.3 kg)  06/18/14 209 lb (94.802 kg)  05/21/14 209 lb 4 oz (94.915 kg)  05/07/14 206 lb 12.7 oz (93.8 kg)  04/23/14 219 lb 3.2 oz (99.428 kg)  04/14/14 210 lb 1.6 oz (95.3 kg)  03/26/14 223 lb 7 oz (101.35 kg)  03/01/14 216 lb 7.9 oz (98.2 kg)  02/13/14 219 lb 12.8 oz (99.7 kg)    BMI:  Body mass index is 27.21 kg/(m^2).  Estimated Nutritional Needs:  Kcal:  2100-2300  Protein:  115-135 grams  Fluid:  2.1-2.3 L/day  Skin:  Reviewed, no issues  Diet Order:  Diet clear liquid Room service appropriate?: Yes; Fluid consistency:: Thin  EDUCATION NEEDS:  No education needs identified at this time   Intake/Output Summary (Last 24 hours) at 07/26/14 1303 Last data filed at 07/26/14 1110  Gross per 24 hour  Intake      0 ml  Output   1200 ml  Net  -1200 ml    Last BM:  5/12  Pryor Ochoa RD, LDN Inpatient Clinical Dietitian Pager: [phone] After Hours Pager: [phone]

## 2014-07-27 LAB — GLUCOSE, CAPILLARY: GLUCOSE-CAPILLARY: 100 mg/dL — AB (ref 65–99)

## 2014-07-27 NOTE — Progress Notes (Signed)
[facility] PROGRESS NOTE  [name] FTD:[id number] DOB: [birth year] DOA: 07/25/2014 PCP: [name], MD  Assessment/Plan: Subdural hematoma with associated acute right-sided weakness -Appreciate previous neurosurgery/Dr. Vertell Limber input; no acute surgical issues elucidated -Patient did have fall 2 weeks ago prior to previous admission and unclear if this is a delayed response traumatic subdural or spontaneous given patient also has chronic thrombocytopenia and platelets at times do fall below 100,000 -Repeat CT head non contrasted today.  -No pharmacological DVT prophylaxis -neurology recommending MRI brain and T, L spine.  -MRI to be done on Monday under conscious sedation.    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/History of Clostridium difficile colitis -Continue preadmission IV cefepime and oral vancomycin -PICC line dressing due to be changed today and question of change in positioning of PICC line based on chest x-ray/IV team to evaluate    Protein-calorie malnutrition, severe/Status post insertion PEG tube/FTT in adult -Continue twice a day bolus feeds during the day and continuous tube feedings and 100 mL per hour from 6 PM to 6 AM -Continue free water as prior to admission -Continue Protostat as prior to admission -Continue oral clear liquids with Gatorade preferred as prior to admission -Continue Marinol as prior to admission   Cardiomyopathy-EF 35-40% by echo Jan 2016 -Compensated without any signs of heart failure exacerbation   HTN  -Blood pressure controlled   Chronic Thrombocytopenia -Platelets greater than 100,000 and at baseline   History of DVT of lower extremity (Feb 2015) -post IVC filter   Stage III chronic kidney disease -No function stable and at baseline  Code Status: Partial Code.  Family Communication: none at bedside.  Disposition Plan: Remain inpatient     Consultants:  neurology  Procedures:  none  Antibiotics:  Cefepime  Oral vancomycin  HPI/Subjective: Was complaining of right shoulder pain and hip pain, chronic pain. Just received Vicodin. He wants to take a nap/   Objective: Filed Vitals:   07/27/14 1054  BP: 140/75  Pulse: 63  Temp: 98.4 F (36.9 C)  Resp: 18    Intake/Output Summary (Last 24 hours) at 07/27/14 1423 Last data filed at 07/27/14 1200  Gross per 24 hour  Intake      0 ml  Output   1050 ml  Net  -1050 ml   Filed Weights   07/25/14 1120  Weight: 96.163 kg (212 lb)    Exam:   General:  Alert in no distress.   Cardiovascular: S 1, S 2 RRR  Respiratory: CTA  Abdomen: Peg tube place, Bs present, soft.   Musculoskeletal: *no edema  Neuro; alert following commands. Right lower extremity weakness.   Data Reviewed: Basic Metabolic Panel:  Recent Labs Lab 07/25/14 1205 07/26/14 0400  NA 139 138  K 4.1 3.5  CL 104 104  CO2 26 26  GLUCOSE 97 104*  BUN 37* 34*  CREATININE 1.17 1.14  CALCIUM 9.3 9.4   Liver Function Tests: No results for input(s): AST, ALT, ALKPHOS, BILITOT, PROT, ALBUMIN in the last 168 hours. No results for input(s): LIPASE, AMYLASE in the last 168 hours. No results for input(s): AMMONIA in the last 168 hours. CBC:  Recent Labs Lab 07/25/14 1205 07/26/14 0400  WBC 4.9 4.1  HGB 10.7* 10.4*  HCT 33.1* 32.5*  MCV 87.3 87.6  PLT 120* 114*   Cardiac Enzymes: No results for input(s): CKTOTAL, CKMB, CKMBINDEX, TROPONINI in the last 168 hours. BNP (last 3 results)  Recent Labs  04/09/14 1918  BNP 1434.7*    ProBNP (last 3 results) No results for input(s): PROBNP in the last 8760 hours.  CBG: No results for input(s): GLUCAP in the last 168 hours.  No results found for this or any previous visit (from the past 240 hour(s)).   Studies: Ct Head Wo Contrast  07/26/2014   CLINICAL DATA:  History of subdural hematoma  EXAM: CT HEAD WITHOUT CONTRAST   TECHNIQUE: Contiguous axial images were obtained from the base of the skull through the vertex without intravenous contrast.  COMPARISON:  07/25/2014  FINDINGS: The bony calvarium is intact. No gross soft tissue abnormality is noted. There is again noted a focal subdural hematoma along the falx cerebri posteriorly best seen on image number 24 of series 2. It again measures approximately 8 mm. The overall appearance is stable from the prior exam. No new focal hemorrhage is seen. Cavum septum pellucidum is again identified. No findings to suggest acute infarct or space-occupying mass lesion are noted. Diffuse atrophic changes are seen.  IMPRESSION: Stable posterior subdural hematoma as described.  Atrophic changes without other acute abnormality.   Electronically Signed   By: Inez Catalina M.D.   On: 07/26/2014 13:09    Scheduled Meds: . ceFEPIme (MAXIPIME) 2 GM IVP  2 g Intravenous Q12H  . dronabinol  2.5 mg Oral TID  . feeding supplement (JEVITY 1.2 CAL)  237 mL Per Tube BID WC  . feeding supplement (PRO-STAT SUGAR FREE 64)  30 mL Per Tube TID  . fesoterodine  4 mg Oral Daily  . free water  200 mL Per Tube QID  . LORazepam  1 mg Intravenous Once  . magnesium oxide  200 mg Oral Daily  . simvastatin  20 mg Oral q1800  . sodium chloride  3 mL Intravenous Q12H  . vancomycin  125 mg Oral QID   Continuous Infusions: . feeding supplement (JEVITY 1.2 CAL) 1,000 mL (07/26/14 2211)    Principal Problem:   Subdural hematoma Active Problems:   HTN (hypertension)   Chronic Thrombocytopenia   History of DVT of lower extremity (Feb 2015) -post IVC filter   Protein-calorie malnutrition, severe   History of Bacteremia due to Pseudomonas   Stage III chronic kidney disease   Status post insertion of percutaneous endoscopic gastrostomy (PEG) tube   FTT (failure to thrive) in adult   S/P AAA repair 2014 with chronic graft infection   Cardiomyopathy-EF 35-40% by echo Jan 2016   History of Clostridium  difficile colitis   Acute right-sided weakness   Subarachnoid hemorrhage   Malnutrition of moderate degree    Time spent: 30 minutes.     Niel Hummer A  [facility] Pager [phone]. If 7PM-7AM, please contact night-coverage at [email or web address], password [facility] 07/27/2014, 2:23 PM  LOS: 2 days

## 2014-07-28 DIAGNOSIS — B965 Pseudomonas (aeruginosa) (mallei) (pseudomallei) as the cause of diseases classified elsewhere: Secondary | ICD-10-CM

## 2014-07-28 DIAGNOSIS — R7881 Bacteremia: Secondary | ICD-10-CM

## 2014-07-28 LAB — GLUCOSE, CAPILLARY
Glucose-Capillary: 108 mg/dL — ABNORMAL HIGH (ref 65–99)
Glucose-Capillary: 95 mg/dL (ref 65–99)

## 2014-07-28 LAB — BASIC METABOLIC PANEL
Anion gap: 7 (ref 5–15)
BUN: 36 mg/dL — AB (ref 6–20)
CO2: 27 mmol/L (ref 22–32)
CREATININE: 1.07 mg/dL (ref 0.61–1.24)
Calcium: 9.1 mg/dL (ref 8.9–10.3)
Chloride: 102 mmol/L (ref 101–111)
GFR calc Af Amer: >60 mL/min (ref >60)
GFR calc non Af Amer: >60 mL/min (ref >60)
GLUCOSE: 114 mg/dL — AB (ref 65–99)
Potassium: 3.8 mmol/L (ref 3.5–5.1)
Sodium: 136 mmol/L (ref 135–145)

## 2014-07-28 LAB — CBC
HEMATOCRIT: 31.7 % — AB (ref 39.0–52.0)
Hemoglobin: 10.1 g/dL — ABNORMAL LOW (ref 13.0–17.0)
MCH: 28.1 pg (ref 26.0–34.0)
MCHC: 31.9 g/dL (ref 30.0–36.0)
MCV: 88.1 fL (ref 78.0–100.0)
PLATELETS: 119 10*3/uL — AB (ref 150–400)
RBC: 3.6 MIL/uL — AB (ref 4.22–5.81)
RDW: 16.3 % — ABNORMAL HIGH (ref 11.5–15.5)
WBC: 4.8 10*3/uL (ref 4.0–10.5)

## 2014-07-28 MED ORDER — METRONIDAZOLE 500 MG PO TABS
500.0000 mg | ORAL_TABLET | Freq: Three times a day (TID) | ORAL | Status: DC
  Administered 2014-07-28 – 2014-07-31 (×10): 500 mg via ORAL
  Filled 2014-07-28 (×7): qty 1

## 2014-07-28 NOTE — Progress Notes (Signed)
[facility] PROGRESS NOTE  [name] VZD:[id number] DOB: 1929-07-19 DOA: 07/25/2014 PCP: [name], MD  Assessment/Plan: Subdural hematoma with associated acute right-sided weakness -Appreciate previous neurosurgery/Dr. Vertell Limber input; no acute surgical issues elucidated -Patient did have fall 2 weeks ago prior to previous admission and unclear if this is a delayed response traumatic subdural or spontaneous given patient also has chronic thrombocytopenia and platelets at times do fall below 100,000 -Repeat CT head non contrasted today.  -No pharmacological DVT prophylaxis -neurology recommending MRI brain and T, L spine.  -MRI to be done on Monday under conscious sedation on Monday.   Diarrhea, C diff;  On chronic Vancomycin.  Diarrhea worse.  Will add flagyl.  Rectal tube, patient with skin irritation.  Check GI pathogen.   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/History of Clostridium difficile colitis -Continue preadmission IV cefepime and oral vancomycin   Protein-calorie malnutrition, severe/Status post insertion PEG tube/FTT in adult -Continue twice a day bolus feeds during the day and continuous tube feedings and 100 mL per hour from 6 PM to 6 AM -Continue free water as prior to admission -Continue Protostat as prior to admission -Continue oral clear liquids with Gatorade preferred as prior to admission -Continue Marinol as prior to admission  Cardiomyopathy-EF 35-40% by echo Jan 2016 -Compensated without any signs of heart failure exacerbation  HTN  -Blood pressure controlled  Chronic Thrombocytopenia -Platelets greater than 100,000 and at baseline  History 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  Family Communication: none at bedside.  Disposition Plan: Remain inpatient     Consultants:  neurology  Procedures:  none  Antibiotics:  Cefepime  Oral vancomycin  HPI/Subjective: Alert in no distress. Following command.  Complaining of worsening diarrhea.   Objective: Filed Vitals:   07/28/14 1342  BP: 121/69  Pulse: 69  Temp: 98.7 F (37.1 C)  Resp: 18    Intake/Output Summary (Last 24 hours) at 07/28/14 1351 Last data filed at 07/28/14 0800  Gross per 24 hour  Intake      0 ml  Output    700 ml  Net   -700 ml   Filed Weights   07/25/14 1120  Weight: 96.163 kg (212 lb)    Exam:   General:  Alert in no distress.   Cardiovascular: S 1, S 2 RRR  Respiratory: CTA  Abdomen: Peg tube place, Bs present, soft.   Musculoskeletal: *no edema  Neuro; alert following commands. Right lower extremity weakness.   Data Reviewed: Basic Metabolic Panel:  Recent Labs Lab 07/25/14 1205 07/26/14 0400 07/28/14 0500  NA 139 138 136  K 4.1 3.5 3.8  CL 104 104 102  CO2 26 26 27   GLUCOSE 97 104* 114*  BUN 37* 34* 36*  CREATININE 1.17 1.14 1.07  CALCIUM 9.3 9.4 9.1   Liver Function Tests: No results for input(s): AST, ALT, ALKPHOS, BILITOT, PROT, ALBUMIN in the last 168 hours. No results for input(s): LIPASE, AMYLASE in the last 168 hours. No results for input(s): AMMONIA in the last 168 hours. CBC:  Recent Labs Lab 07/25/14 1205 07/26/14 0400 07/28/14 0500  WBC 4.9 4.1 4.8  HGB 10.7* 10.4* 10.1*  HCT 33.1* 32.5* 31.7*  MCV 87.3 87.6 88.1  PLT 120* 114* 119*   Cardiac Enzymes: No results for input(s): CKTOTAL, CKMB, CKMBINDEX, TROPONINI in the last 168  hours. BNP (last 3 results)  Recent Labs  04/09/14 1918  BNP 1434.7*    ProBNP (last 3 results) No results for input(s): PROBNP in the last 8760 hours.  CBG:  Recent Labs Lab 07/27/14 2014 07/28/14 0403  GLUCAP 100* 108*    No results found for this or any previous visit (from the past 240 hour(s)).   Studies: No results found.  Scheduled Meds: .  ceFEPIme (MAXIPIME) 2 GM IVP  2 g Intravenous Q12H  . dronabinol  2.5 mg Oral TID  . feeding supplement (JEVITY 1.2 CAL)  237 mL Per Tube BID WC  . feeding supplement (PRO-STAT SUGAR FREE 64)  30 mL Per Tube TID  . fesoterodine  4 mg Oral Daily  . free water  200 mL Per Tube QID  . LORazepam  1 mg Intravenous Once  . magnesium oxide  200 mg Oral Daily  . metroNIDAZOLE  500 mg Oral 3 times per day  . simvastatin  20 mg Oral q1800  . sodium chloride  3 mL Intravenous Q12H  . vancomycin  125 mg Oral QID   Continuous Infusions: . feeding supplement (JEVITY 1.2 CAL) 1,000 mL (07/27/14 1742)    Principal Problem:   Subdural hematoma Active Problems:   HTN (hypertension)   Chronic Thrombocytopenia   History of DVT of lower extremity (Feb 2015) -post IVC filter   Protein-calorie malnutrition, severe   History of Bacteremia due to Pseudomonas   Stage III chronic kidney disease   Status post insertion of percutaneous endoscopic gastrostomy (PEG) tube   FTT (failure to thrive) in adult   S/P AAA repair 2014 with chronic graft infection   Cardiomyopathy-EF 35-40% by echo Jan 2016   History of Clostridium difficile colitis   Acute right-sided weakness   Subarachnoid hemorrhage   Malnutrition of moderate degree    Time spent: 30 minutes.     Niel Hummer A  [facility] Pager [phone]. If 7PM-7AM, please contact night-coverage at [email or web address], password [facility] 07/28/2014, 1:51 PM  LOS: 3 days

## 2014-07-29 ENCOUNTER — Ambulatory Visit (HOSPITAL_COMMUNITY): Payer: Medicare Other

## 2014-07-29 LAB — GLUCOSE, CAPILLARY
GLUCOSE-CAPILLARY: 93 mg/dL (ref 65–99)
GLUCOSE-CAPILLARY: 89 mg/dL (ref 65–99)
GLUCOSE-CAPILLARY: 87 mg/dL (ref 65–99)
Glucose-Capillary: 106 mg/dL — ABNORMAL HIGH (ref 65–99)
Glucose-Capillary: 130 mg/dL — ABNORMAL HIGH (ref 65–99)

## 2014-07-29 LAB — CBC
HCT: 33.3 % — ABNORMAL LOW (ref 39.0–52.0)
Hemoglobin: 10.9 g/dL — ABNORMAL LOW (ref 13.0–17.0)
MCH: 28.5 pg (ref 26.0–34.0)
MCHC: 32.7 g/dL (ref 30.0–36.0)
MCV: 87.2 fL (ref 78.0–100.0)
Platelets: 119 10*3/uL — ABNORMAL LOW (ref 150–400)
RBC: 3.82 MIL/uL — ABNORMAL LOW (ref 4.22–5.81)
RDW: 16.1 % — AB (ref 11.5–15.5)
WBC: 5.3 10*3/uL (ref 4.0–10.5)

## 2014-07-29 LAB — BASIC METABOLIC PANEL
Anion gap: 8 (ref 5–15)
BUN: 35 mg/dL — ABNORMAL HIGH (ref 6–20)
CALCIUM: 9.4 mg/dL (ref 8.9–10.3)
CO2: 27 mmol/L (ref 22–32)
CREATININE: 1.11 mg/dL (ref 0.61–1.24)
Chloride: 102 mmol/L (ref 101–111)
GFR calc Af Amer: >60 mL/min (ref >60)
GFR calc non Af Amer: 60 mL/min — ABNORMAL LOW (ref >60)
Glucose, Bld: 119 mg/dL — ABNORMAL HIGH (ref 65–99)
Potassium: 3.8 mmol/L (ref 3.5–5.1)
Sodium: 137 mmol/L (ref 135–145)

## 2014-07-29 NOTE — Progress Notes (Signed)
[facility] PROGRESS NOTE  [name]:[id number] DOB: September 06, 1929 DOA: 07/25/2014 PCP: [name], MD  Assessment/Plan: Subdural hematoma with associated acute right-sided weakness -Appreciate previous neurosurgery/Dr. Vertell Limber input; no acute surgical issues elucidated -Patient did have fall 2 weeks ago prior to previous admission and unclear if this is a delayed response traumatic subdural or spontaneous given patient also has chronic thrombocytopenia and platelets at times do fall below 100,000 -Repeat CT head non contrasted today.  -No pharmacological DVT prophylaxis -neurology recommending MRI brain and T, L spine.  -MRI to be done on Monday under conscious sedation on Monday.   Diarrhea, C diff;  On chronic Vancomycin.  Diarrhea worse.  Added flagyl 5-15. Rectal tube PRN, patient with skin irritation.  GI pathogen pending.  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/History of Clostridium difficile colitis -Continue preadmission IV cefepime and oral vancomycin   Protein-calorie malnutrition, severe/Status post insertion PEG tube/FTT in adult -Continue twice a day bolus feeds during the day and continuous tube feedings and 100 mL per hour from 6 PM to 6 AM -Continue free water as prior to admission -Continue Protostat as prior to admission -Continue oral clear liquids with Gatorade preferred as prior to admission -Continue Marinol as prior to admission  Cardiomyopathy-EF 35-40% by echo Jan 2016 -Compensated without any signs of heart failure exacerbation  HTN  -Blood pressure controlled  Chronic Thrombocytopenia -Platelets greater than 100,000 and at baseline  History 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  Family Communication: none at bedside.  Disposition Plan: Remain inpatient     Consultants:  neurology  Procedures:  none  Antibiotics:  Cefepime  Oral vancomycin  HPI/Subjective: Alert in no distress. Following command.  Still with diarrhea, denies abdominal pain  Objective: Filed Vitals:   07/29/14 1020  BP: 115/69  Pulse: 65  Temp: 97.5 F (36.4 C)  Resp: 18    Intake/Output Summary (Last 24 hours) at 07/29/14 1735 Last data filed at 07/29/14 1643  Gross per 24 hour  Intake    400 ml  Output   1100 ml  Net   -700 ml   Filed Weights   07/25/14 1120  Weight: 96.163 kg (212 lb)    Exam:   General:  Alert in no distress.   Cardiovascular: S 1, S 2 RRR  Respiratory: CTA  Abdomen: Peg tube place, Bs present, soft.   Musculoskeletal: *no edema  Neuro; alert following commands. Right lower extremity weakness.   Data Reviewed: Basic Metabolic Panel:  Recent Labs Lab 07/25/14 1205 07/26/14 0400 07/28/14 0500 07/29/14 0930  NA 139 138 136 137  K 4.1 3.5 3.8 3.8  CL 104 104 102 102  CO2 26 26 27 27   GLUCOSE 97 104* 114* 119*  BUN 37* 34* 36* 35*  CREATININE 1.17 1.14 1.07 1.11  CALCIUM 9.3 9.4 9.1 9.4   Liver Function Tests: No results for input(s): AST, ALT, ALKPHOS, BILITOT, PROT, ALBUMIN in the last 168 hours. No results for input(s): LIPASE, AMYLASE in the last 168 hours. No results for input(s): AMMONIA in the last 168 hours. CBC:  Recent Labs Lab 07/25/14 1205 07/26/14 0400 07/28/14 0500 07/29/14 0930  WBC 4.9 4.1 4.8 5.3  HGB 10.7* 10.4* 10.1* 10.9*  HCT 33.1* 32.5* 31.7* 33.3*  MCV 87.3 87.6 88.1 87.2  PLT 120* 114* 119* 119*  Cardiac Enzymes: No results for input(s): CKTOTAL, CKMB, CKMBINDEX, TROPONINI in the last 168 hours. BNP (last 3 results)  Recent Labs  04/09/14 1918  BNP 1434.7*    ProBNP (last 3 results) No results for input(s): PROBNP in the last 8760 hours.  CBG:  Recent Labs Lab 07/29/14 0056 07/29/14 0356 07/29/14 0757 07/29/14 1237 07/29/14 1647  GLUCAP 106* 93  89 130* 87    No results found for this or any previous visit (from the past 240 hour(s)).   Studies: No results found.  Scheduled Meds: . ceFEPIme (MAXIPIME) 2 GM IVP  2 g Intravenous Q12H  . dronabinol  2.5 mg Oral TID  . feeding supplement (JEVITY 1.2 CAL)  237 mL Per Tube BID WC  . feeding supplement (PRO-STAT SUGAR FREE 64)  30 mL Per Tube TID  . fesoterodine  4 mg Oral Daily  . free water  200 mL Per Tube QID  . LORazepam  1 mg Intravenous Once  . magnesium oxide  200 mg Oral Daily  . metroNIDAZOLE  500 mg Oral 3 times per day  . simvastatin  20 mg Oral q1800  . sodium chloride  3 mL Intravenous Q12H  . vancomycin  125 mg Oral QID   Continuous Infusions: . feeding supplement (JEVITY 1.2 CAL) 1,000 mL (07/28/14 1713)    Principal Problem:   Subdural hematoma Active Problems:   HTN (hypertension)   Chronic Thrombocytopenia   History of DVT of lower extremity (Feb 2015) -post IVC filter   Protein-calorie malnutrition, severe   History of Bacteremia due to Pseudomonas   Stage III chronic kidney disease   Status post insertion of percutaneous endoscopic gastrostomy (PEG) tube   FTT (failure to thrive) in adult   S/P AAA repair 2014 with chronic graft infection   Cardiomyopathy-EF 35-40% by echo Jan 2016   History of Clostridium difficile colitis   Acute right-sided weakness   Subarachnoid hemorrhage   Malnutrition of moderate degree    Time spent: 30 minutes.     Niel Hummer A  [facility] Pager [phone]. If 7PM-7AM, please contact night-coverage at [email or web address], password [facility] 07/29/2014, 5:35 PM  LOS: 4 days

## 2014-07-29 NOTE — Progress Notes (Signed)
Occupational Therapy Treatment Patient Details Name: [name] MRN: [id number] DOB: 1929/10/18 Today's Date: 07/29/2014    History of present illness pt presents with recent fall sustaining SDH and now R LE weakness.  pt with hx of Aortic Graft Infection, HTN, IVC Filter, and G-tube.     OT comments  Pt refusing OOB to chair but did agree with incr time and effort to static sit EOB. Pt demonstrates shaving and washing face with quick termination of task. Pt needs reinforcement to sustain attention to task. Pt is unsafe to d/c home at this time. Recommending SNF. Pt reports today that wife never leaves the house. Question (A) level for d/c planning if patient refuses SNF.   Follow Up Recommendations  SNF    Equipment Recommendations  None recommended by OT    Recommendations for Other Services      Precautions / Restrictions Precautions Precautions: Fall Precaution Comments: G-tube       Mobility Bed Mobility Overal bed mobility: Needs Assistance Bed Mobility: Rolling;Supine to Sit Rolling: Min assist   Supine to sit: Mod assist;+2 for physical assistance;HOB elevated     General bed mobility comments: pt required incr time and effort  Transfers                 General transfer comment: refusing complete OOB to chair    Balance Overall balance assessment: Needs assistance Sitting-balance support: Bilateral upper extremity supported;Feet supported Sitting balance-Leahy Scale: Poor                             ADL Overall ADL's : Needs assistance/impaired   Eating/Feeding Details (indicate cue type and reason): refusing any food by mouth Grooming: Sitting;Maximal assistance (shaving) Grooming Details (indicate cue type and reason): pt using an electric razor and reports "i can't" pt encouraged to use R hand and able to shave face but verbalized inability     Lower Body Bathing: +2 for physical assistance;Total assistance                          General ADL Comments: Pt supine on arrival and at first refusing all (A). pt with encouragement agreeable to bed level bath for peri care. pt noted to have a red rash and redness at sacrum from prolonged bed rest. Pt s RN Roselyn Reef called to room and asked to address sacrum. NO dressing available at this time but RN ordering. Pt with incr time and effort encouraged  to sit EOB. Pt attempting to terminate early and needed encouragement to remain eob. Pt reports "i can't move my R leg but noted to demonstrate ankle flexion and moving toes"      Vision                 Additional Comments: unable to read clock, pt states "i can't read that" but when challenged to assess pt refusing to participateq   Perception     Praxis      Cognition   Behavior During Therapy: Flat affect Overall Cognitive Status: Impaired/Different from baseline Area of Impairment: Memory;Following commands;Safety/judgement;Awareness;Problem solving     Memory: Decreased recall of precautions;Decreased short-term memory  Following Commands: Follows one step commands with increased time Safety/Judgement: Decreased awareness of safety;Decreased awareness of deficits Awareness: Intellectual Problem Solving: Slow processing;Decreased initiation;Difficulty sequencing General Comments: Pt reports that he arrived to [facility] on Friday. pt unaware of day of the  week, arrival date, course of events of today and answers questions with "i dont know about that"     Extremity/Trunk Assessment               Exercises     Shoulder Instructions       General Comments      Pertinent Vitals/ Pain       Pain Assessment: Faces Faces Pain Scale: Hurts whole lot Pain Location: R shoulder Pain Intervention(s): Monitored during session;Repositioned  Home Living                                          Prior Functioning/Environment              Frequency Min 2X/week     Progress  Toward Goals  OT Goals(current goals can now be found in the care plan section)  Progress towards OT goals: Progressing toward goals  Acute Rehab OT Goals Patient Stated Goal: to remain in the bed OT Goal Formulation: Patient unable to participate in goal setting Time For Goal Achievement: 08/09/14 Potential to Achieve Goals: Good ADL Goals Pt Will Perform Grooming: with min guard assist;sitting Pt Will Perform Upper Body Bathing: with min guard assist;sitting Pt Will Perform Lower Body Bathing: with mod assist;sit to/from stand Pt Will Transfer to Toilet: with min guard assist;bedside commode;ambulating  Plan Discharge plan remains appropriate    Co-evaluation                 End of Session     Activity Tolerance Patient tolerated treatment well   Patient Left in bed;with call bell/phone within reach;with bed alarm set   Nurse Communication Mobility status;Precautions        Time: 4128-7867 OT Time Calculation (min): 36 min  Charges: OT General Charges $OT Visit: 1 Procedure OT Treatments $Self Care/Home Management : 23-37 mins  Parke Poisson B 07/29/2014, 11:23 AM Pager: [phone]

## 2014-07-29 NOTE — Care Management Note (Signed)
Case Management Note  Patient Details  Name: [name] MRN: [id number] Date of Birth: 1929/06/09  Subjective/Objective:       ADMITTED WITH SDH             Action/Plan: [facility] WORKER FOLLOWING FOR SNF PLACEMENT; PATIENT WITH DIARRHEA, BACTEREMIA, TO HAVE MRI UNDER CONSCIOUS SEDATION;  Expected Discharge Date:    07/31/2014              Expected Discharge Plan:  [place]  In-House Referral:  Clinical Social Work  Discharge planning Services  CM Consult   Status of Service:  In process, will continue to follow   Additional Comments:  Austin Miles [phone] 07/29/2014, 4:54 PM

## 2014-07-29 NOTE — Clinical Social Work Note (Signed)
CSW reviewed chart. Once pt is medically stable he will transition to [place]. CSW will continue to provided support and assist with discharge needs.   Leonard, MSW, [place]

## 2014-07-30 ENCOUNTER — Inpatient Hospital Stay (HOSPITAL_COMMUNITY): Payer: Medicare Other

## 2014-07-30 ENCOUNTER — Telehealth: Payer: Self-pay | Admitting: *Deleted

## 2014-07-30 ENCOUNTER — Encounter (HOSPITAL_COMMUNITY)
Admission: EM | Disposition: A | Discharge status: Skilled Nursing Facility | Payer: Medicare Other | Source: Home / Self Care | Attending: Internal Medicine

## 2014-07-30 ENCOUNTER — Ambulatory Visit: Payer: Medicare Other | Admitting: Internal Medicine

## 2014-07-30 ENCOUNTER — Inpatient Hospital Stay (HOSPITAL_COMMUNITY): Payer: Medicare Other | Admitting: Anesthesiology

## 2014-07-30 HISTORY — PX: RADIOLOGY WITH ANESTHESIA: SHX6223

## 2014-07-30 LAB — GI PATHOGEN PANEL BY PCR, STOOL
C difficile toxin A/B: NOT DETECTED
Campylobacter by PCR: NOT DETECTED
Cryptosporidium by PCR: NOT DETECTED
E coli (ETEC) LT/ST: NOT DETECTED
E coli (STEC): NOT DETECTED
E coli 0157 by PCR: NOT DETECTED
G LAMBLIA BY PCR: NOT DETECTED
Norovirus GI/GII: NOT DETECTED
ROTAVIRUS A BY PCR: NOT DETECTED
SALMONELLA BY PCR: NOT DETECTED
SHIGELLA BY PCR: NOT DETECTED

## 2014-07-30 LAB — BASIC METABOLIC PANEL
Anion gap: 7 (ref 5–15)
BUN: 36 mg/dL — ABNORMAL HIGH (ref 6–20)
CO2: 26 mmol/L (ref 22–32)
CREATININE: 1.15 mg/dL (ref 0.61–1.24)
Calcium: 9.5 mg/dL (ref 8.9–10.3)
Chloride: 104 mmol/L (ref 101–111)
GFR, EST NON AFRICAN AMERICAN: 57 mL/min — AB (ref >60)
Glucose, Bld: 90 mg/dL (ref 65–99)
POTASSIUM: 4.1 mmol/L (ref 3.5–5.1)
Sodium: 137 mmol/L (ref 135–145)

## 2014-07-30 LAB — GLUCOSE, CAPILLARY: Glucose-Capillary: 103 mg/dL — ABNORMAL HIGH (ref 65–99)

## 2014-07-30 SURGERY — RADIOLOGY WITH ANESTHESIA
Anesthesia: General

## 2014-07-30 MED ORDER — PROPOFOL 10 MG/ML IV BOLUS
INTRAVENOUS | Status: AC
  Filled 2014-07-30: qty 20

## 2014-07-30 MED ORDER — PROMETHAZINE HCL 25 MG/ML IJ SOLN: 6.2500 mg | INTRAMUSCULAR | Status: DC | PRN

## 2014-07-30 MED ORDER — HYDROMORPHONE HCL 1 MG/ML IJ SOLN: 0.2500 mg | INTRAMUSCULAR | Status: DC | PRN

## 2014-07-30 MED ORDER — SODIUM CHLORIDE 0.9 % IJ SOLN
10.0000 mL | INTRAMUSCULAR | Status: DC | PRN
  Administered 2014-07-30 – 2014-08-01 (×3): 10 mL
  Filled 2014-07-30 (×3): qty 40

## 2014-07-30 MED ORDER — GADOBENATE DIMEGLUMINE 529 MG/ML IV SOLN
20.0000 mL | Freq: Once | INTRAVENOUS | Status: AC | PRN
  Administered 2014-07-30: 20 mL via INTRAVENOUS

## 2014-07-30 MED ORDER — FENTANYL CITRATE (PF) 250 MCG/5ML IJ SOLN
INTRAMUSCULAR | Status: AC
  Filled 2014-07-30: qty 5

## 2014-07-30 NOTE — Progress Notes (Signed)
Patient left the floor for a MRI under conscious sedation.

## 2014-07-30 NOTE — Anesthesia Preprocedure Evaluation (Signed)
Anesthesia Evaluation  Patient identified by MRN, date of birth, ID band Patient awake    Reviewed: Allergy & Precautions, H&P , NPO status , Patient's Chart, lab work & pertinent test results  History of Anesthesia Complications Negative for: history of anesthetic complications  Airway Mallampati: II  TM Distance: >3 FB     Dental  (+) Dental Advisory Given   Pulmonary former smoker,  breath sounds clear to auscultation        Cardiovascular hypertension, Pt. on medications + Peripheral Vascular Disease Rhythm:Regular Rate:Normal     Neuro/Psych negative neurological ROS  negative psych ROS   GI/Hepatic negative GI ROS, Neg liver ROS,   Endo/Other  negative endocrine ROS  Renal/GU Renal disease     Musculoskeletal negative musculoskeletal ROS (+)   Abdominal   Peds  Hematology  (+) anemia ,   Anesthesia Other Findings   Reproductive/Obstetrics negative OB ROS                                                              Anesthesia Evaluation  Patient identified by MRN, date of birth, ID band Patient awake    Reviewed: Allergy & Precautions, H&P , NPO status , Patient's Chart, lab work & pertinent test results  Airway Mallampati: II TM Distance: >3 FB Neck ROM: full    Dental  (+) Edentulous Upper, Edentulous Lower   Pulmonary neg pulmonary ROS, former smoker,  breath sounds clear to auscultation  Pulmonary exam normal       Cardiovascular Exercise Tolerance: Good hypertension, Pt. on medications Rhythm:regular Rate:Normal  AAA stenting   Neuro/Psych Weakness right leg negative neurological ROS  negative psych ROS   GI/Hepatic negative GI ROS, Neg liver ROS,   Endo/Other  negative endocrine ROS  Renal/GU negative Renal ROS  negative genitourinary   Musculoskeletal   Abdominal   Peds  Hematology negative hematology ROS (+)   Anesthesia Other  Findings   Reproductive/Obstetrics negative OB ROS                          Anesthesia Physical Anesthesia Plan  ASA: III  Anesthesia Plan: General   Post-op Pain Management:    Induction: Intravenous  Airway Management Planned: Oral ETT  Additional Equipment:   Intra-op Plan:   Post-operative Plan: Extubation in OR  Informed Consent: I have reviewed the patients History and Physical, chart, labs and discussed the procedure including the risks, benefits and alternatives for the proposed anesthesia with the patient or authorized representative who has indicated his/her understanding and acceptance.   Dental Advisory Given  Plan Discussed with: CRNA and Surgeon  Anesthesia Plan Comments:         Anesthesia Quick Evaluation  Anesthesia Physical  Anesthesia Plan  ASA: III  Anesthesia Plan: General   Post-op Pain Management:    Induction: Intravenous  Airway Management Planned: Oral ETT  Additional Equipment:   Intra-op Plan:   Post-operative Plan: Extubation in OR  Informed Consent: I have reviewed the patients History and Physical, chart, labs and discussed the procedure including the risks, benefits and alternatives for the proposed anesthesia with the patient or authorized representative who has indicated his/her understanding and acceptance.   Dental advisory given  Plan Discussed with: CRNA  and Anesthesiologist  Anesthesia Plan Comments:         Anesthesia Quick Evaluation

## 2014-07-30 NOTE — Transfer of Care (Signed)
Immediate Anesthesia Transfer of Care Note  Patient: [name]  Procedure(s) Performed: Procedure(s): MRI BRAIN WITH AND WITHOUT,MRI LUMBER WITHOUT CONTRAST,MRI THORACIC (N/A)  Patient Location: PACU  Anesthesia Type:General  Level of Consciousness: awake and alert   Airway & Oxygen Therapy: Patient Spontanous Breathing and Patient connected to nasal cannula oxygen  Post-op Assessment: Report given to RN and Post -op Vital signs reviewed and stable  Post vital signs: Reviewed and stable  Last Vitals:  Filed Vitals:   07/30/14 0551  BP: 145/83  Pulse: 87  Temp: 36.9 C  Resp: 18    Complications: No apparent anesthesia complications

## 2014-07-30 NOTE — Progress Notes (Signed)
[facility] PROGRESS NOTE  REASE WENCE ZWC:[id number] DOB: October 06, 1929 DOA: 07/25/2014 PCP: [name], MD  Assessment/Plan:  Subdural hematoma with associated acute right-sided weakness -Appreciate previous neurosurgery/Dr. Vertell Limber input; no acute surgical issues elucidated -Patient did have fall 2 weeks ago prior to previous admission and unclear if this is a delayed response traumatic subdural or spontaneous given patient also has chronic thrombocytopenia and platelets at times do fall below 100,000 -Repeat CT head non contrasted today.  -No pharmacological DVT prophylaxis -neurology recommending MRI brain and T, L spine.  -MRI brain negative for infection, posterior para falcine subdural hematoma larger on the left similar to recent CT, . MRI lumbar spine showed The most severe stenosis is at L3-L4, with moderate central and bilateral subarticular stenosis potentially affecting both descending L4 nerves. No findings to suggest spinal infection in this patient with chronic aortic graft infection. -Will follow neuro recommendation regarding MRI lumbar spine finding.   Diarrhea, C diff;  On chronic Vancomycin.  Diarrhea got worse during this admission.  Patient was started on  flagyl 5-15.  Rectal tube PRN, patient with skin irritation.  GI pathogen pending.  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/History of Clostridium difficile colitis -Continue preadmission IV cefepime and oral vancomycin   Protein-calorie malnutrition, severe/Status post insertion PEG tube/FTT in adult -Continue twice a day bolus feeds during the day and continuous tube feedings and 100 mL per hour from 6 PM to 6 AM -Continue free water as prior to admission -Continue Protostat as prior to admission -Continue oral clear liquids with Gatorade preferred as prior to admission -Continue Marinol as prior to admission  Cardiomyopathy-EF 35-40% by echo  Jan 2016 -Compensated without any signs of heart failure exacerbation  HTN  -Blood pressure controlled  Chronic Thrombocytopenia -Platelets greater than 100,000 and at baseline  History 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  Family Communication: Care discussed with daughter, [name] 5-17. Disposition Plan: Remain inpatient    Consultants:  neurology  Procedures:  none  Antibiotics:  Cefepime  Oral vancomycin  Flagyl.   HPI/Subjective: Sleepy, open eyes to voice. Just came from MRI under conscious sedation.  He relates improvement of diarrhea.   Objective: Filed Vitals:   07/30/14 1200  BP: 121/58  Pulse: 64  Temp:   Resp: 23    Intake/Output Summary (Last 24 hours) at 07/30/14 1329 Last data filed at 07/30/14 0540  Gross per 24 hour  Intake    200 ml  Output   1290 ml  Net  -1090 ml   Filed Weights   07/25/14 1120  Weight: 96.163 kg (212 lb)    Exam:   General: Sleepy, open eyes answer some questions.   Cardiovascular: S 1, S 2 RRR  Respiratory: CTA  Abdomen: Peg tube place, Bs present, soft.   Musculoskeletal: *no edema  Neuro; sleepy, Right lower extremity weakness.   Data Reviewed: Basic Metabolic Panel:  Recent Labs Lab 07/25/14 1205 07/26/14 0400 07/28/14 0500 07/29/14 0930 07/30/14 0500  NA 139 138 136 137 137  K 4.1 3.5 3.8 3.8 4.1  CL 104 104 102 102 104  CO2 26 26 27 27 26   GLUCOSE 97 104* 114* 119* 90  BUN 37* 34* 36* 35* 36*  CREATININE 1.17 1.14 1.07 1.11 1.15  CALCIUM 9.3 9.4 9.1 9.4 9.5   Liver Function  Tests: No results for input(s): AST, ALT, ALKPHOS, BILITOT, PROT, ALBUMIN in the last 168 hours. No results for input(s): LIPASE, AMYLASE in the last 168 hours. No results for input(s): AMMONIA in the last 168 hours. CBC:  Recent Labs Lab 07/25/14 1205 07/26/14 0400  07/28/14 0500 07/29/14 0930  WBC 4.9 4.1 4.8 5.3  HGB 10.7* 10.4* 10.1* 10.9*  HCT 33.1* 32.5* 31.7* 33.3*  MCV 87.3 87.6 88.1 87.2  PLT 120* 114* 119* 119*   Cardiac Enzymes: No results for input(s): CKTOTAL, CKMB, CKMBINDEX, TROPONINI in the last 168 hours. BNP (last 3 results)  Recent Labs  04/09/14 1918  BNP 1434.7*    ProBNP (last 3 results) No results for input(s): PROBNP in the last 8760 hours.  CBG:  Recent Labs Lab 07/29/14 0056 07/29/14 0356 07/29/14 0757 07/29/14 1237 07/29/14 1647  GLUCAP 106* 93 89 130* 87    No results found for this or any previous visit (from the past 240 hour(s)).   Studies: Mr [name] Contrast  2014/08/14   CLINICAL DATA:  81 year old hypertensive male with cardiomyopathy post fall 2 weeks ago. Headache and increasing right lower extremity weakness. Generalized weakness. CT detected subdural hematoma. Subsequent encounter.  EXAM: MRI HEAD WITHOUT AND WITH CONTRAST  TECHNIQUE: Multiplanar, multiecho pulse sequences of the brain and surrounding structures were obtained without and with intravenous contrast.  CONTRAST:  34mL MULTIHANCE GADOBENATE DIMEGLUMINE 529 MG/ML IV SOLN  COMPARISON:  07/26/2014 and 07/25/2014 CT.  No comparison MR.  FINDINGS: No acute thrombotic infarct.  Posterior parafalcine subdural hematoma larger on the left with maximal thickness of 8 mm similar to the recent CT. Tiny amount of adjacent subarachnoid blood better depicted on MR.  Mild small vessel disease type changes.  Cavum septum pellucidum et vergae incidentally noted without hydrocephalus. Baseline mild global atrophy.  No intracranial enhancing lesion.  Right vertebral artery predominantly ends in a posterior inferior cerebellar artery distribution with only small contribution to the basilar artery. Left vertebral artery and basilar artery are ectatic and patent. Patent internal carotid arteries and major dural sinuses.  Minimal to mild partial opacification  mastoid air cells. No obstructing lesion of eustachian tube noted. Pooling of secretions posterior superior nasopharynx the incidentally noted. Minimal mucosal thickening ethmoid sinus air cells.  Post lens replacement. Symmetric prominent superior ophthalmic veins incidentally noted with cavernous sinus region unremarkable.  Cervical medullary junction, pituitary and pineal region unremarkable.  IMPRESSION: No acute thrombotic infarct.  Posterior parafalcine subdural hematoma larger on the left with maximal thickness of 8 mm similar to the recent CT. Tiny amount of adjacent subarachnoid blood better depicted on MR.  Mild small vessel disease type changes.  Please see above.   Electronically Signed   By: Genia Del M.D.   On: 08/14/2014 11:47   Mr Lumbar Spine Wo Contrast  August 14, 2014   CLINICAL DATA:  Weakness of the RIGHT leg. RIGHT lower extremity radiculopathy. Chronic aortic graft infection.  EXAM: MRI LUMBAR SPINE WITHOUT CONTRAST  TECHNIQUE: Multiplanar, multisequence MR imaging of the lumbar spine was performed. No intravenous contrast was administered.  COMPARISON:  CT 04/29/2014.  FINDINGS: The study is technically degraded due to artifact from the aortic stent graft along the anterior aspect of the lumbar spine. This less significantly affects the axial images but obscures portions of the anterior thoracolumbar spine. The study is diagnostic.  Segmentation: The numbering convention used for this exam termed L5-S1 as the last intervertebral disc space.  Alignment: Trace anterolisthesis of L5  on S1 appears degenerative and facet mediated.  Vertebrae:  Preserved vertebral body height grossly.  Conus medullaris: Normal termination at L1-L2.  Paraspinal tissues: Inferior pole LEFT renal cyst appears unchanged compared to prior CT. Paraspinal muscular atrophy.  Disc levels:  Disc Signal: Partial ossification of the L4-L5 disc. Portions of the discs are obscured by artifact from stent graft.  L1-L2:   Negative.  L2-L3: Facet hypertrophy and posterior ligamentum flavum redundancy. Central canal is adequately patent. Mild LEFT subarticular stenosis associated with facet arthrosis and hypertrophy. The foramina are patent.  L3-L4: Moderate multifactorial central stenosis is present. There is bilateral right-greater-than-left subarticular stenosis with compression of the descending L4 nerves bilaterally. Probable 7 mm proteinaceous synovial cyst arising from the anterior aspect of the LEFT L3-L4 facet joint.  L4-L5: Mild central stenosis associated with facet arthrosis, ligamentum flavum redundancy and minimal disc bulging. There is mild bilateral subarticular stenosis associated with facet hypertrophy but no definite neural compression. Foramina patent.  L5-S1: Severe bilateral facet arthrosis. Central canal patent. There is right-greater-than-left bilateral subarticular stenosis. Flattening of the nerve root sleeve of the descending S1 nerves bilaterally with probable compression of the RIGHT S1 nerve roots in the lateral recess. Neural foramina appear patent.  IMPRESSION: Moderate multilevel lumbar degenerative disease, predominately affecting the facet joints. The most severe stenosis is at L3-L4, with moderate central and bilateral subarticular stenosis potentially affecting both descending L4 nerves. No findings to suggest spinal infection in this patient with chronic aortic graft infection.   Electronically Signed   By: Dereck Ligas M.D.   On: 07/30/2014 11:38    Scheduled Meds: . ceFEPIme (MAXIPIME) 2 GM IVP  2 g Intravenous Q12H  . dronabinol  2.5 mg Oral TID  . feeding supplement (JEVITY 1.2 CAL)  237 mL Per Tube BID WC  . feeding supplement (PRO-STAT SUGAR FREE 64)  30 mL Per Tube TID  . fesoterodine  4 mg Oral Daily  . free water  200 mL Per Tube QID  . LORazepam  1 mg Intravenous Once  . magnesium oxide  200 mg Oral Daily  . metroNIDAZOLE  500 mg Oral 3 times per day  . simvastatin  20 mg  Oral q1800  . sodium chloride  3 mL Intravenous Q12H  . vancomycin  125 mg Oral QID   Continuous Infusions: . feeding supplement (JEVITY 1.2 CAL) 1,000 mL (07/28/14 1713)    Principal Problem:   Subdural hematoma Active Problems:   HTN (hypertension)   Chronic Thrombocytopenia   History of DVT of lower extremity (Feb 2015) -post IVC filter   Protein-calorie malnutrition, severe   History of Bacteremia due to Pseudomonas   Stage III chronic kidney disease   Status post insertion of percutaneous endoscopic gastrostomy (PEG) tube   FTT (failure to thrive) in adult   S/P AAA repair 2014 with chronic graft infection   Cardiomyopathy-EF 35-40% by echo Jan 2016   History of Clostridium difficile colitis   Acute right-sided weakness   Subarachnoid hemorrhage   Malnutrition of moderate degree    Time spent: 30 minutes.     Niel Hummer A  [facility] Pager [phone]. If 7PM-7AM, please contact night-coverage at [email or web address], password [email or web address] 07/30/2014, 1:29 PM  LOS: 5 days

## 2014-07-30 NOTE — Anesthesia Postprocedure Evaluation (Signed)
Anesthesia Post Note  Patient: [name]  Procedure(s) Performed: Procedure(s) (LRB): MRI BRAIN WITH AND WITHOUT,MRI LUMBER WITHOUT CONTRAST,MRI THORACIC (N/A)  Anesthesia type: general  Patient location: PACU  Post pain: Pain level controlled  Post assessment: Patient's Cardiovascular Status Stable  Last Vitals:  Filed Vitals:   07/30/14 1200  BP: 121/58  Pulse:   Temp:   Resp:     Post vital signs: Reviewed and stable  Level of consciousness: sedated  Complications: No apparent anesthesia complications

## 2014-07-30 NOTE — Progress Notes (Signed)
Physical Therapy Treatment Patient Details Name: [name] MRN: [id number] DOB: 09-11-1929 Today's Date: 07/30/2014    History of Present Illness pt presents with recent fall sustaining SDH and now R LE weakness.  pt with hx of Aortic Graft Infection, HTN, IVC Filter, and G-tube.      PT Comments    Progressing slowly due to self limiting behaviors.  Emphasis was on standing activity.   Follow Up Recommendations  SNF     Equipment Recommendations  None recommended by PT    Recommendations for Other Services       Precautions / Restrictions Precautions Precautions: Fall    Mobility  Bed Mobility Overal bed mobility: Needs Assistance Bed Mobility: Sit to Sidelying         Sit to sidelying: Min assist General bed mobility comments: pt required incr time and effort  Transfers Overall transfer level: Needs assistance Equipment used: Rolling walker (2 wheeled) Transfers: Sit to/from Stand;Lateral/Scoot Transfers Sit to Stand: Mod assist;+2 safety/equipment        Lateral/Scoot Transfers: Min assist General transfer comment: cued for hand placement, assisted to come forward, but more to help power up.  pt fairly shaky when coming up.    Ambulation/Gait             General Gait Details: refused more than 1 standing trial.   Stairs            Wheelchair Mobility    Modified Rankin (Stroke Patients Only)       Balance Overall balance assessment: Needs assistance   Sitting balance-Leahy Scale: Fair     Standing balance support: Bilateral upper extremity supported Standing balance-Leahy Scale: Poor Standing balance comment: stood approx 1 min with moderate use of the RW to complete pericare for pastey stool.                    Cognition Arousal/Alertness: Awake/alert Behavior During Therapy: Flat affect Overall Cognitive Status: Impaired/Different from baseline         Following Commands: Follows one step commands with  increased time     Problem Solving: Slow processing;Decreased initiation;Difficulty sequencing      Exercises      General Comments        Pertinent Vitals/Pain Pain Assessment: Faces Pain Score: 0-No pain Faces Pain Scale: Hurts little more Pain Location: vague Pain Descriptors / Indicators: Grimacing Pain Intervention(s): Monitored during session;Repositioned    Home Living                      Prior Function            PT Goals (current goals can now be found in the care plan section) Acute Rehab PT Goals PT Goal Formulation: With patient Time For Goal Achievement: 08/09/14 Potential to Achieve Goals: Fair Progress towards PT goals: Not progressing toward goals - comment (self limiting behaviors)    Frequency  Min 2X/week    PT Plan Current plan remains appropriate    Co-evaluation             End of Session   Activity Tolerance: Other (comment);Patient tolerated treatment well (limited by self limiting behaviors) Patient left: in bed;with call bell/phone within reach;with bed alarm set     Time: 1443-1540 PT Time Calculation (min) (ACUTE ONLY): 22 min  Charges:  $Therapeutic Activity: 8-22 mins  G Codes:      Danitra Payano, Tessie Fass 07/30/2014, 5:35 PM 07/30/2014  Donnella Sham, [place] [phone]  (pager)

## 2014-07-30 NOTE — Telephone Encounter (Signed)
Patient's daughter wanted to update ID physicians that her father has been admitted to [facility] again.  She would like to know if ID has been consulted about a possible infection?  He is in room 29, 4N. Landis Gandy, RN

## 2014-07-31 ENCOUNTER — Encounter (HOSPITAL_COMMUNITY): Payer: Self-pay | Admitting: Radiology

## 2014-07-31 DIAGNOSIS — Z8719 Personal history of other diseases of the digestive system: Secondary | ICD-10-CM

## 2014-07-31 DIAGNOSIS — W19XXXA Unspecified fall, initial encounter: Secondary | ICD-10-CM

## 2014-07-31 DIAGNOSIS — Z8619 Personal history of other infectious and parasitic diseases: Secondary | ICD-10-CM

## 2014-07-31 DIAGNOSIS — Z87891 Personal history of nicotine dependence: Secondary | ICD-10-CM

## 2014-07-31 DIAGNOSIS — E46 Unspecified protein-calorie malnutrition: Secondary | ICD-10-CM

## 2014-07-31 DIAGNOSIS — M25511 Pain in right shoulder: Secondary | ICD-10-CM

## 2014-07-31 DIAGNOSIS — S065X0A Traumatic subdural hemorrhage without loss of consciousness, initial encounter: Secondary | ICD-10-CM

## 2014-07-31 DIAGNOSIS — T827XXD Infection and inflammatory reaction due to other cardiac and vascular devices, implants and grafts, subsequent encounter: Secondary | ICD-10-CM

## 2014-07-31 DIAGNOSIS — B964 Proteus (mirabilis) (morganii) as the cause of diseases classified elsewhere: Secondary | ICD-10-CM

## 2014-07-31 LAB — GLUCOSE, CAPILLARY
Glucose-Capillary: 77 mg/dL (ref 65–99)
Glucose-Capillary: 112 mg/dL — ABNORMAL HIGH (ref 65–99)

## 2014-07-31 LAB — CLOSTRIDIUM DIFFICILE BY PCR: Toxigenic C. Difficile by PCR: NEGATIVE

## 2014-07-31 MED ORDER — VANCOMYCIN 50 MG/ML ORAL SOLUTION
125.0000 mg | Freq: Two times a day (BID) | ORAL | Status: DC
  Administered 2014-07-31 – 2014-08-01 (×2): 125 mg via ORAL
  Filled 2014-07-31 (×3): qty 2.5

## 2014-07-31 NOTE — Progress Notes (Signed)
Pt. Refused SCD's after being educated and stated he could not move his legs. Joaquin Bend E, [place] 07/31/2014 2102

## 2014-07-31 NOTE — Consult Note (Addendum)
[name]  Date of Admission:  07/25/2014  Date of Consult:  07/31/2014  Reason for Consult: Aortic graft infection, chronic Referring Physician: (pt's family)  Impression/Recommendation Aortic graft infection, chronic  Pseudomonas, proteus Subdural hematoma Previous C diff (repeat 5-17 -) Protein calorie malnutrition ? Thrush R clavicle pain (reported)  Would: Stop flagyl Continue po vanco (change to BID) and IV cefepime til he has outpt ID f/u (suspect he will be on these lifelong). Consider rehab eval PT watch oral exam, if evidence of thrush, start po nystatin.  Consider plain film of R clavicle due to pt's c/o pain, pt's worry of asymmetry.    Comment: Pt states his bowel movements are improving. Will stop flagyl and decrease vanco dosing. Received phone call from pt's family that pt has new infection. I do not see evidence of this. Please let ID know if there are any questions.   Thank you so much for caring for this pt,   Bobby Rumpf (pager) [phone] [email or web address]  [name] is an 81 y.o. male.  HPI:  81 year old male with a history of a thoraco-abdominal aortic aneurysm in July 6063 complicated by Pseudomonas (sensitive to ceftaz, Zosyn, gent, Levaquin) graft infection and subsequent psuedomonas (12-16-13, pan-sens). He was started on cefepime at that time and was discharged to skilled nursing facility with plan for ongoing IV antibiotics. He has been followed by [facility] infectious disease. His PICC line was removed 12-9 and his antibiotics were changed to by mouth Levaquin by [facility] ID.  He is sent to [facility] on 12-10 with a temp of 103.3 and with sepsis and suspected recurrent AAA graft infection. His BCx grew proteus (R- bactrim, cipro, gent. I - tobra). He was treated with merrem/rifampin and then was supposed to f/u at [facility] ID for his anbx for his anbx for his graft. He and his family instead chose to f/u at  [facility]. By his MAR he is on levaquin/rifampin. By his d/c notes he completed his merrem on 03-07-14.  He had C diff+ 02-26-14, treated with vanco 184m qid.  He was hospitalized 04-09-14 to 04-23-14 with septic shock (BCx -). He was d/c home with high dose cipro.  He retuned to ED 2-15 with f/c, n/v. He was again bacteremic with P mirabilis (R- cipro). He was d/c back to SNF on 2-23 on IV cefepime. He had f/u in ID clinic on 3-8 and was planned to stay on IV cefepime for at least 4 more weeks. He returned to ID clinic on 4-5 and was doing well with the exception of having had a C diff recurrence. He was maintained on po vanco and IV cefepime with plan to remain on these indefinitely.   He returned on 5-3 with temp to 106.7 at his SNF. His WBC was normal, BCx and UCx were (-). He was afebrile in the hospital.  He was discharged home on 5-6 on po vanco, cefepime and rifampin.    He returns to hospital on 5-12 with increasing RLE weakness and fall. He developed RUE weakness as well. In ED he had CT showing subdural hematoma along posterior L falx. This was confirmed on MRI. He also had MRI of spine which did not show evidence of infection (did have L3-4 stenosis).   He has been afebrile in the hospital.   Past Medical History  Diagnosis Date  . Hypertension   . BPH (benign prostatic hyperplasia)   . Hyperlipidemia   . AAA (  abdominal aortic aneurysm) 04/2012    STENTING OF AAA IN [place]  . Junctional cardiac arrhythmia     Occurred postoperatively after urologic surgery  . Severe sinus bradycardia     Occurred postoperatively after urologic surgery  . Aortic valve disorders   . Other primary cardiomyopathies   . Dysphagia   . Status post insertion of percutaneous endoscopic gastrostomy (PEG) tube 02/11/14  . Bacteremia due to Pseudomonas   . Infected aortic graft   . DVT (deep venous thrombosis) 04/2013    s/p IVC Filter  . Cancer 2008-2009    prostate TREATED WITH RADIATION    Past  Surgical History  Procedure Laterality Date  . Colectomy      FOR DIVERTICULITIS  . Appendectomy    . Hernia repair    . Bladder surgery  2008    FOR BLADDER STONE  . Total knee arthroplasty Left 1990  . Abdominal aortic aneurysm repair  04/2012  . Total knee arthroplasty Right 07/26/2012    Procedure: RIGHT TOTAL KNEE ARTHROPLASTY;  Surgeon: Tobi Bastos, MD;  Location: [facility] ORS;  Service: Orthopedics;  Laterality: Right;  . Nephrolithotomy Left 04/26/2013    Procedure: LEFT PERCUTANEOUS NEPHROLITHOTOMY ;  Surgeon: [name], MD;  Location: [facility] ORS;  Service: Urology;  Laterality: Left;  . Nephrolithotomy Left 05/03/2013    Procedure: 2ND STAGE LEFT PERCUTANEOUS NEPHROLITHOTOMY ;  Surgeon: [name], MD;  Location: [facility] ORS;  Service: Urology;  Laterality: Left;  . Esophagogastroduodenoscopy N/A 02/11/2014    LHT:DSKAJ hiatal hernia. Patchy erythema of the gastric mucosa; otherwise negative EGD. Status post 45 French Microvasive PEG tube placement  . Peg placement N/A 02/11/2014    Procedure: PERCUTANEOUS ENDOSCOPIC GASTROSTOMY (PEG) PLACEMENT;  Surgeon: Daneil Dolin, MD;  Location: [facility] ENDO SUITE;  Service: Endoscopy;  Laterality: N/A;  . Tee without cardioversion N/A 05/02/2014    Procedure: TRANSESOPHAGEAL ECHOCARDIOGRAM (TEE);  Surgeon: Lelon Perla, MD;  Location: [facility] ENDOSCOPY;  Service: Cardiovascular;  Laterality: N/A;  . Joint replacement    . Cataract extraction, bilateral Bilateral   . Vena cava filter placement  2015    Archie Endo 05/14/2013  . Prostate biopsy      hx/notes 04/25/2013  . Hemorrhoid surgery      hx/notes 04/25/2013  . Transurethral resection of prostate      hx/notes 04/25/2013     Allergies  Allergen Reactions  . Atorvastatin Other (See Comments)    Causes constipation  . Penicillins Other (See Comments)    Other reaction(s): RASH HIVES  Tolerates cefepime 2/22    Medications:  Scheduled: . ceFEPIme (MAXIPIME) 2 GM IVP  2 g Intravenous Q12H  .  dronabinol  2.5 mg Oral TID  . feeding supplement (JEVITY 1.2 CAL)  237 mL Per Tube BID WC  . feeding supplement (PRO-STAT SUGAR FREE 64)  30 mL Per Tube TID  . fesoterodine  4 mg Oral Daily  . free water  200 mL Per Tube QID  . LORazepam  1 mg Intravenous Once  . metroNIDAZOLE  500 mg Oral 3 times per day  . simvastatin  20 mg Oral q1800  . sodium chloride  3 mL Intravenous Q12H  . vancomycin  125 mg Oral QID    Abtx:  Anti-infectives    Start     Dose/Rate Route Frequency Ordered Stop   07/28/14 1415  metroNIDAZOLE (FLAGYL) tablet 500 mg     500 mg Oral 3 times per day 07/28/14 1351  07/25/14 2200  ceFEPIme (MAXIPIME) 2 g in dextrose 5 % 50 mL IVPB     2 g 100 mL/hr over 30 Minutes Intravenous Every 12 hours 07/25/14 1644     07/25/14 1800  vancomycin (VANCOCIN) 50 mg/mL oral solution 125 mg     125 mg Oral 4 times daily 07/25/14 1644        Total days of antibiotics: 91 (cefepime, po vanco, flagyl)          Social History:  reports that he quit smoking about 29 years ago. His smoking use included Cigarettes. He started smoking about 65 years ago. He has a 74 pack-year smoking history. He has quit using smokeless tobacco. He reports that he does not drink alcohol or use illicit drugs.  Family History  Problem Relation Age of Onset  . Lung cancer Father     General ROS: states diarrhea getting better. no dysuria. weakness RLE. see HPI.   Blood pressure 147/75, pulse 70, temperature 98.5 F (36.9 C), temperature source Oral, resp. rate 18, height 6' 2" (1.88 m), weight 96.163 kg (212 lb), SpO2 99 %. General appearance: alert, cooperative and no distress Eyes: negative findings: pupils equal, round, reactive to light and accomodation Throat: normal moisture. ? thrush Neck: no adenopathy and supple, symmetrical, trachea midline Lungs: clear to auscultation bilaterally and R clavicular head is non-displaced, non-tender.  Heart: regular rate and rhythm Abdomen: normal  findings: bowel sounds normal and soft, non-tender Extremities: edema none. LUE PIC is clean.  Neurologic: Motor: UE grip and biceps is 5/5 bilaterally. RLE is 4+/5 with tremor. LLE 5/5.    Results for orders placed or performed during the hospital encounter of 07/25/14 (from the past 48 hour(s))  Glucose, capillary     Status: None   Collection Time: 07/29/14  4:47 PM  Result Value Ref Range   Glucose-Capillary 87 65 - 99 mg/dL  Basic metabolic panel     Status: Abnormal   Collection Time: 07/30/14  5:00 AM  Result Value Ref Range   Sodium 137 135 - 145 mmol/L   Potassium 4.1 3.5 - 5.1 mmol/L   Chloride 104 101 - 111 mmol/L   CO2 26 22 - 32 mmol/L   Glucose, Bld 90 65 - 99 mg/dL   BUN 36 (H) 6 - 20 mg/dL   Creatinine, Ser 1.15 0.61 - 1.24 mg/dL   Calcium 9.5 8.9 - 10.3 mg/dL   GFR calc non Af Amer 57 (L) >60 mL/min   GFR calc Af Amer >60 >60 mL/min    Comment: (NOTE) The eGFR has been calculated using the CKD EPI equation. This calculation has not been validated in all clinical situations. eGFR's persistently <60 mL/min signify possible Chronic Kidney Disease.    Anion gap 7 5 - 15  Glucose, capillary     Status: Abnormal   Collection Time: 07/30/14  5:35 PM  Result Value Ref Range   Glucose-Capillary 103 (H) 65 - 99 mg/dL  Clostridium Difficile by PCR     Status: None   Collection Time: 07/30/14  7:55 PM  Result Value Ref Range   C difficile by pcr NEGATIVE NEGATIVE      Component Value Date/Time   SDES URINE, RANDOM 07/16/2014 0648   SPECREQUEST NONE 07/16/2014 0648   CULT NO GROWTH Performed at [facility]  07/16/2014 1962   REPTSTATUS 07/17/2014 FINAL 07/16/2014 0648   Mr Brain W Wo Contrast  07/30/2014   CLINICAL DATA:  81 year old hypertensive male  with cardiomyopathy post fall 2 weeks ago. Headache and increasing right lower extremity weakness. Generalized weakness. CT detected subdural hematoma. Subsequent encounter.  EXAM: MRI HEAD WITHOUT AND WITH  CONTRAST  TECHNIQUE: Multiplanar, multiecho pulse sequences of the brain and surrounding structures were obtained without and with intravenous contrast.  CONTRAST:  26m MULTIHANCE GADOBENATE DIMEGLUMINE 529 MG/ML IV SOLN  COMPARISON:  07/26/2014 and 07/25/2014 CT.  No comparison MR.  FINDINGS: No acute thrombotic infarct.  Posterior parafalcine subdural hematoma larger on the left with maximal thickness of 8 mm similar to the recent CT. Tiny amount of adjacent subarachnoid blood better depicted on MR.  Mild small vessel disease type changes.  Cavum septum pellucidum et vergae incidentally noted without hydrocephalus. Baseline mild global atrophy.  No intracranial enhancing lesion.  Right vertebral artery predominantly ends in a posterior inferior cerebellar artery distribution with only small contribution to the basilar artery. Left vertebral artery and basilar artery are ectatic and patent. Patent internal carotid arteries and major dural sinuses.  Minimal to mild partial opacification mastoid air cells. No obstructing lesion of eustachian tube noted. Pooling of secretions posterior superior nasopharynx the incidentally noted. Minimal mucosal thickening ethmoid sinus air cells.  Post lens replacement. Symmetric prominent superior ophthalmic veins incidentally noted with cavernous sinus region unremarkable.  Cervical medullary junction, pituitary and pineal region unremarkable.  IMPRESSION: No acute thrombotic infarct.  Posterior parafalcine subdural hematoma larger on the left with maximal thickness of 8 mm similar to the recent CT. Tiny amount of adjacent subarachnoid blood better depicted on MR.  Mild small vessel disease type changes.  Please see above.   Electronically Signed   By: SGenia DelM.D.   On: 07/30/2014 11:47   Mr Lumbar Spine Wo Contrast  07/30/2014   CLINICAL DATA:  Weakness of the RIGHT leg. RIGHT lower extremity radiculopathy. Chronic aortic graft infection.  EXAM: MRI LUMBAR SPINE WITHOUT  CONTRAST  TECHNIQUE: Multiplanar, multisequence MR imaging of the lumbar spine was performed. No intravenous contrast was administered.  COMPARISON:  CT 04/29/2014.  FINDINGS: The study is technically degraded due to artifact from the aortic stent graft along the anterior aspect of the lumbar spine. This less significantly affects the axial images but obscures portions of the anterior thoracolumbar spine. The study is diagnostic.  Segmentation: The numbering convention used for this exam termed L5-S1 as the last intervertebral disc space.  Alignment: Trace anterolisthesis of L5 on S1 appears degenerative and facet mediated.  Vertebrae:  Preserved vertebral body height grossly.  Conus medullaris: Normal termination at L1-L2.  Paraspinal tissues: Inferior pole LEFT renal cyst appears unchanged compared to prior CT. Paraspinal muscular atrophy.  Disc levels:  Disc Signal: Partial ossification of the L4-L5 disc. Portions of the discs are obscured by artifact from stent graft.  L1-L2:  Negative.  L2-L3: Facet hypertrophy and posterior ligamentum flavum redundancy. Central canal is adequately patent. Mild LEFT subarticular stenosis associated with facet arthrosis and hypertrophy. The foramina are patent.  L3-L4: Moderate multifactorial central stenosis is present. There is bilateral right-greater-than-left subarticular stenosis with compression of the descending L4 nerves bilaterally. Probable 7 mm proteinaceous synovial cyst arising from the anterior aspect of the LEFT L3-L4 facet joint.  L4-L5: Mild central stenosis associated with facet arthrosis, ligamentum flavum redundancy and minimal disc bulging. There is mild bilateral subarticular stenosis associated with facet hypertrophy but no definite neural compression. Foramina patent.  L5-S1: Severe bilateral facet arthrosis. Central canal patent. There is right-greater-than-left bilateral subarticular stenosis. Flattening of the nerve  root sleeve of the descending S1  nerves bilaterally with probable compression of the RIGHT S1 nerve roots in the lateral recess. Neural foramina appear patent.  IMPRESSION: Moderate multilevel lumbar degenerative disease, predominately affecting the facet joints. The most severe stenosis is at L3-L4, with moderate central and bilateral subarticular stenosis potentially affecting both descending L4 nerves. No findings to suggest spinal infection in this patient with chronic aortic graft infection.   Electronically Signed   By: Dereck Ligas M.D.   On: 07/30/2014 11:38   Recent Results (from the past 240 hour(s))  Clostridium Difficile by PCR     Status: None   Collection Time: 07/30/14  7:55 PM  Result Value Ref Range Status   C difficile by pcr NEGATIVE NEGATIVE Final      07/31/2014, 2:53 PM     LOS: 6 days

## 2014-07-31 NOTE — Progress Notes (Signed)
Occupational Therapy Treatment Patient Details Name: [name] MRN: [id number] DOB: [birth year] Today's Date: 07/31/2014    History of present illness pt presents with recent fall sustaining SDH and now R LE weakness.  pt with hx of Aortic Graft Infection, HTN, IVC Filter, and G-tube.     OT comments  Pt has inconsistently reported home (A) levels. Daughter Fraser Din present this session and reports that 3 daughters are primary caregivers and currently 1 daughter has back injury. Daughter present reports that family can not provided necessary assistance at home but patient declines SNF. Family very concerned with continued therapy for d/c planning. Pt demonstrates OOB to standing this session. Pt fatigues quickly and cogntive deficits during session.   Follow Up Recommendations  SNF    Equipment Recommendations  None recommended by OT    Recommendations for Other Services      Precautions / Restrictions Precautions Precautions: Fall Precaution Comments: gtube picc       Mobility Bed Mobility Overal bed mobility: Needs Assistance Bed Mobility: Supine to Sit;Rolling Rolling: Min guard   Supine to sit: Min assist     General bed mobility comments: mod v/c for sequence but with great return demo. pt pushign with R UE into sitting and no c/o pain with this task. pt once EOB demonstrates dynamci sitting ( see adl section) Pt was unable to perform knee adduction , hip abduction or knee flexion supine prior to mobility. Pt only able to perform ankle pumps  Transfers Overall transfer level: Needs assistance   Transfers: Sit to/from Stand Sit to Stand: +2 physical assistance;Max assist         General transfer comment: x2 attempts with incr time. Pt decr ability to power up into standing    Balance           Standing balance support: Bilateral upper extremity supported;During functional activity Standing balance-Leahy Scale: Zero Standing balance comment: pt static  standing leaning anterior on back of chair for ~2-3 minutes                   ADL Overall ADL's : Needs assistance/impaired     Grooming: Wash/dry face;Set up;Sitting Grooming Details (indicate cue type and reason): cues to initiate (declined shaving)                               General ADL Comments: Pt agreeable to bed mobility and eob initially. pt needed encouragement from therapist and daughter Fraser Din to complete sit<>Stand due to fear of falling. Pt agreeable to have "skin checked" by RN. Pt does demonstrates a rash in sacral area nad RN applying powder. Ot with chair positioned in front of patient backward so that he can lean on the back of the chair for balance. pt felt more confident with therapist and OTS on either side. pt demonstrates inconsistent knee extension EOB. pt using BIL UE to perform hip flexion ( hands behind knee with upward lift LE off floor) then performed knee extension. pt demonstrates dynamic sitting balance by performing task this way.       Vision                     Perception     Praxis      Cognition   Behavior During Therapy: Flat affect Overall Cognitive Status: Impaired/Different from baseline Area of Impairment: Safety/judgement;Awareness          Safety/Judgement:  Decreased awareness of safety;Decreased awareness of deficits   Problem Solving: Slow processing;Decreased initiation;Difficulty sequencing General Comments: Pt no recall of previous sessions or seeing OT prior. pt needed cues for safety and encouragement to progress.    Extremity/Trunk Assessment               Exercises     Shoulder Instructions       General Comments      Pertinent Vitals/ Pain       Pain Assessment: Faces  Home Living                                          Prior Functioning/Environment              Frequency Min 2X/week     Progress Toward Goals  OT Goals(current goals can now be found  in the care plan section)  Progress towards OT goals: Progressing toward goals  Acute Rehab OT Goals Patient Stated Goal: to remain in the bed OT Goal Formulation: Patient unable to participate in goal setting Time For Goal Achievement: 08/09/14 Potential to Achieve Goals: Good ADL Goals Pt Will Perform Grooming: with min guard assist;sitting Pt Will Perform Upper Body Bathing: with min guard assist;sitting Pt Will Perform Lower Body Bathing: with mod assist;sit to/from stand Pt Will Transfer to Toilet: with min guard assist;bedside commode;ambulating  Plan Discharge plan remains appropriate    Co-evaluation                 End of Session Equipment Utilized During Treatment: Gait belt   Activity Tolerance Patient tolerated treatment well   Patient Left in bed;with call bell/phone within reach;with family/visitor present;with bed alarm set   Nurse Communication Mobility status;Precautions        Time: 2841-3244 OT Time Calculation (min): 34 min  Charges: OT General Charges $OT Visit: 1 Procedure OT Treatments $Self Care/Home Management : 23-37 mins  Parke Poisson B 07/31/2014, 3:32 PM  Pager: [phone]

## 2014-07-31 NOTE — Progress Notes (Addendum)
Subjective: patient still complaining of right leg weakness.  Refuses to move it and when attempted to test his strength he pulls his leg back with good strength secondary to pain.    Objective: Current vital signs: BP 94/74 mmHg  Pulse 68  Temp(Src) 98 F (36.7 C) (Oral)  Resp 18  Ht 6\' 2"  (1.88 m)  Wt 96.163 kg (212 lb)  BMI 27.21 kg/m2  SpO2 96% Vital signs in last 24 hours: Temp:  [97 F (36.1 C)-98.8 F (37.1 C)] 98 F (36.7 C) (05/18 0618) Pulse Rate:  [62-86] 68 (05/18 0618) Resp:  [18-25] 18 (05/18 0120) BP: (94-144)/(48-80) 94/74 mmHg (05/18 0618) SpO2:  [92 %-100 %] 96 % (05/18 0618)  Intake/Output from previous day: 05/17 0701 - 05/18 0700 In: -  Out: 600 [Urine:600] Intake/Output this shift:   Nutritional status: Diet clear liquid Room service appropriate?: Yes; Fluid consistency:: Thin  Neurologic Exam:  Mental Status: Alert, oriented.  Speech fluent without evidence of aphasia.  Able to follow 3 step commands without difficulty. Cranial Nerves: II: Visual fields grossly normal, pupils equal, round, reactive to light and accommodation III,IV, VI: ptosis not present, extra-ocular motions intact bilaterally V,VII: smile symmetric, facial light touch sensation normal bilaterally VIII: hearing normal bilaterally IX,X: uvula rises symmetrically XI: bilateral shoulder shrug XII: midline tongue extension without atrophy or fasciculations  Motor: Right : Upper extremity   5/5    Left:     Upper extremity   5/5  Lower extremity   4/5     Lower extremity   5/5 -exam remains limited due to patient not wanting to move leg and refusing to take part in physical exam.  Again he is noted to have 4+/5 strength when moving his leg secondary to pain.  Tone and bulk:normal tone throughout; no atrophy noted Sensory: Pinprick and light touch intact throughout, bilaterally Deep Tendon Reflexes:  Right: Upper Extremity   Left: Upper extremity   biceps (C-5 to C-6) 2/4    biceps (C-5 to C-6) 2/4 tricep (C7) 2/4    triceps (C7) 2/4 Brachioradialis (C6) 2/4  Brachioradialis (C6) 2/4  Lower Extremity Lower Extremity  quadriceps (L-2 to L-4) 1/4   quadriceps (L-2 to L-4) 2/4 Achilles (S1) 2/4   Achilles (S1) 2/4  Plantars: Right: downgoing   Left: downgoing t    Lab Results: Basic Metabolic Panel:  Recent Labs Lab 07/25/14 1205 07/26/14 0400 07/28/14 0500 07/29/14 0930 07/30/14 0500  NA 139 138 136 137 137  K 4.1 3.5 3.8 3.8 4.1  CL 104 104 102 102 104  CO2 26 26 27 27 26   GLUCOSE 97 104* 114* 119* 90  BUN 37* 34* 36* 35* 36*  CREATININE 1.17 1.14 1.07 1.11 1.15  CALCIUM 9.3 9.4 9.1 9.4 9.5    Liver Function Tests: No results for input(s): AST, ALT, ALKPHOS, BILITOT, PROT, ALBUMIN in the last 168 hours. No results for input(s): LIPASE, AMYLASE in the last 168 hours. No results for input(s): AMMONIA in the last 168 hours.  CBC:  Recent Labs Lab 07/25/14 1205 07/26/14 0400 07/28/14 0500 07/29/14 0930  WBC 4.9 4.1 4.8 5.3  HGB 10.7* 10.4* 10.1* 10.9*  HCT 33.1* 32.5* 31.7* 33.3*  MCV 87.3 87.6 88.1 87.2  PLT 120* 114* 119* 119*    Cardiac Enzymes: No results for input(s): CKTOTAL, CKMB, CKMBINDEX, TROPONINI in the last 168 hours.  Lipid Panel: No results for input(s): CHOL, TRIG, HDL, CHOLHDL, VLDL, LDLCALC in the last 168 hours.  CBG:  Recent Labs Lab 07/29/14 0356 07/29/14 0757 07/29/14 1237 07/29/14 1647 07/30/14 1735  GLUCAP 93 89 130* 85 103*    Microbiology: Results for orders placed or performed during the hospital encounter of 07/15/14  Culture, blood (routine x 2)     Status: None   Collection Time: 07/15/14 11:08 PM  Result Value Ref Range Status   Specimen Description BLOOD RIGHT ARM  Final   Special Requests BOTTLES DRAWN AEROBIC AND ANAEROBIC 5CC  Final   Culture   Final    NO GROWTH 5 DAYS Performed at [facility]    Report Status 07/22/2014 FINAL  Final  Culture, blood (routine x 2)      Status: None   Collection Time: 07/16/14 12:22 AM  Result Value Ref Range Status   Specimen Description BLOOD RIGHT ARM  Final   Special Requests BOTTLES DRAWN AEROBIC AND ANAEROBIC 5CC  Final   Culture   Final    NO GROWTH 5 DAYS Performed at [facility]    Report Status 07/22/2014 FINAL  Final  MRSA PCR Screening     Status: Abnormal   Collection Time: 07/16/14  4:49 AM  Result Value Ref Range Status   MRSA by PCR POSITIVE (A) NEGATIVE Final    Comment:        The GeneXpert MRSA Assay (FDA approved for NASAL specimens only), is one component of a comprehensive MRSA colonization surveillance program. It is not intended to diagnose MRSA infection nor to guide or monitor treatment for MRSA infections. RESULT CALLED TO, READ BACK BY AND VERIFIED WITH: RN,MIKKI POTTER [id number] @0725  THANEY   Urine culture     Status: None   Collection Time: 07/16/14  6:48 AM  Result Value Ref Range Status   Specimen Description URINE, RANDOM  Final   Special Requests NONE  Final   Colony Count NO GROWTH Performed at [facility]   Final   Culture NO GROWTH Performed at [facility]   Final   Report Status 07/17/2014 FINAL  Final  Clostridium Difficile by PCR     Status: None   Collection Time: 07/17/14 12:07 PM  Result Value Ref Range Status   C difficile by pcr NEGATIVE NEGATIVE Final    Coagulation Studies: No results for input(s): LABPROT, INR in the last 72 hours.  Imaging: Mr Kizzie Fantasia Contrast  07/30/2014   CLINICAL DATA:  81 year old hypertensive male with cardiomyopathy post fall 2 weeks ago. Headache and increasing right lower extremity weakness. Generalized weakness. CT detected subdural hematoma. Subsequent encounter.  EXAM: MRI HEAD WITHOUT AND WITH CONTRAST  TECHNIQUE: Multiplanar, multiecho pulse sequences of the brain and surrounding structures were obtained without and with intravenous contrast.  CONTRAST:  70mL MULTIHANCE GADOBENATE  DIMEGLUMINE 529 MG/ML IV SOLN  COMPARISON:  07/26/2014 and 07/25/2014 CT.  No comparison MR.  FINDINGS: No acute thrombotic infarct.  Posterior parafalcine subdural hematoma larger on the left with maximal thickness of 8 mm similar to the recent CT. Tiny amount of adjacent subarachnoid blood better depicted on MR.  Mild small vessel disease type changes.  Cavum septum pellucidum et vergae incidentally noted without hydrocephalus. Baseline mild global atrophy.  No intracranial enhancing lesion.  Right vertebral artery predominantly ends in a posterior inferior cerebellar artery distribution with only small contribution to the basilar artery. Left vertebral artery and basilar artery are ectatic and patent. Patent internal carotid arteries and major dural sinuses.  Minimal to mild partial opacification mastoid  air cells. No obstructing lesion of eustachian tube noted. Pooling of secretions posterior superior nasopharynx the incidentally noted. Minimal mucosal thickening ethmoid sinus air cells.  Post lens replacement. Symmetric prominent superior ophthalmic veins incidentally noted with cavernous sinus region unremarkable.  Cervical medullary junction, pituitary and pineal region unremarkable.  IMPRESSION: No acute thrombotic infarct.  Posterior parafalcine subdural hematoma larger on the left with maximal thickness of 8 mm similar to the recent CT. Tiny amount of adjacent subarachnoid blood better depicted on MR.  Mild small vessel disease type changes.  Please see above.   Electronically Signed   By: Genia Del M.D.   On: 07/30/2014 11:47   Mr Lumbar Spine Wo Contrast  07/30/2014   CLINICAL DATA:  Weakness of the RIGHT leg. RIGHT lower extremity radiculopathy. Chronic aortic graft infection.  EXAM: MRI LUMBAR SPINE WITHOUT CONTRAST  TECHNIQUE: Multiplanar, multisequence MR imaging of the lumbar spine was performed. No intravenous contrast was administered.  COMPARISON:  CT 04/29/2014.  FINDINGS: The study is  technically degraded due to artifact from the aortic stent graft along the anterior aspect of the lumbar spine. This less significantly affects the axial images but obscures portions of the anterior thoracolumbar spine. The study is diagnostic.  Segmentation: The numbering convention used for this exam termed L5-S1 as the last intervertebral disc space.  Alignment: Trace anterolisthesis of L5 on S1 appears degenerative and facet mediated.  Vertebrae:  Preserved vertebral body height grossly.  Conus medullaris: Normal termination at L1-L2.  Paraspinal tissues: Inferior pole LEFT renal cyst appears unchanged compared to prior CT. Paraspinal muscular atrophy.  Disc levels:  Disc Signal: Partial ossification of the L4-L5 disc. Portions of the discs are obscured by artifact from stent graft.  L1-L2:  Negative.  L2-L3: Facet hypertrophy and posterior ligamentum flavum redundancy. Central canal is adequately patent. Mild LEFT subarticular stenosis associated with facet arthrosis and hypertrophy. The foramina are patent.  L3-L4: Moderate multifactorial central stenosis is present. There is bilateral right-greater-than-left subarticular stenosis with compression of the descending L4 nerves bilaterally. Probable 7 mm proteinaceous synovial cyst arising from the anterior aspect of the LEFT L3-L4 facet joint.  L4-L5: Mild central stenosis associated with facet arthrosis, ligamentum flavum redundancy and minimal disc bulging. There is mild bilateral subarticular stenosis associated with facet hypertrophy but no definite neural compression. Foramina patent.  L5-S1: Severe bilateral facet arthrosis. Central canal patent. There is right-greater-than-left bilateral subarticular stenosis. Flattening of the nerve root sleeve of the descending S1 nerves bilaterally with probable compression of the RIGHT S1 nerve roots in the lateral recess. Neural foramina appear patent.  IMPRESSION: Moderate multilevel lumbar degenerative disease,  predominately affecting the facet joints. The most severe stenosis is at L3-L4, with moderate central and bilateral subarticular stenosis potentially affecting both descending L4 nerves. No findings to suggest spinal infection in this patient with chronic aortic graft infection.   Electronically Signed   By: Dereck Ligas M.D.   On: 07/30/2014 11:38    Medications:  Scheduled: . ceFEPIme (MAXIPIME) 2 GM IVP  2 g Intravenous Q12H  . dronabinol  2.5 mg Oral TID  . feeding supplement (JEVITY 1.2 CAL)  237 mL Per Tube BID WC  . feeding supplement (PRO-STAT SUGAR FREE 64)  30 mL Per Tube TID  . fesoterodine  4 mg Oral Daily  . free water  200 mL Per Tube QID  . LORazepam  1 mg Intravenous Once  . metroNIDAZOLE  500 mg Oral 3 times per day  .  simvastatin  20 mg Oral q1800  . sodium chloride  3 mL Intravenous Q12H  . vancomycin  125 mg Oral QID    Assessment/Plan:  81 y.o. male with right leg weakness. The SDH is in the correct location for this, but the duration from his fall is odd. MRI T and L spine reviewed and show no clear etiology for his pain or weakness throughout his whole leg.  He dose have  Stenosis at L3/4 which may cause some weakness in the L4 region however his complaint is weakness throughout the leg. At this time recommend continue PT and out patient follow up with neurology for NCV/EMG.   Neurology S/O    Etta Quill PA-C [facility] [phone]  07/31/2014, 8:40 AM Patient seen and examined together with physician assistant and I concur with the assessment and plan.  [name], MD

## 2014-07-31 NOTE — Progress Notes (Signed)
[facility] PROGRESS NOTE  [name] MWN:[id number] DOB: 23-Feb-1930 DOA: 07/25/2014 PCP: [name], MD  Assessment/Plan:  Subdural hematoma with associated acute right-sided weakness -Appreciate previous neurosurgery/Dr. Vertell Limber input; no acute surgical issues elucidated -Patient did have fall 2 weeks ago prior to previous admission and unclear if this is a delayed response traumatic subdural or spontaneous given patient also has chronic thrombocytopenia and platelets at times do fall below 100,000 -No pharmacological DVT prophylaxis -neurology recommending MRI brain and T, L spine: MRI brain negative for infection, posterior para falcine subdural hematoma larger on the left similar to recent CT, . MRI lumbar spine showed The most severe stenosis is at L3-L4, with moderate central and bilateral subarticular stenosis potentially affecting both descending L4 nerves. No findings to suggest spinal infection in this patient with chronic aortic graft infection.  -PT recommending SNF  Diarrhea, C diff;  On chronic Vancomycin.  Diarrhea got worse during this admission.  Patient was started on  flagyl 5-15.  Rectal tube PRN, patient with skin irritation.  GI pathogen pending.  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/History of Clostridium difficile colitis -Continue preadmission IV cefepime and oral vancomycin   Protein-calorie malnutrition, severe/Status post insertion PEG tube/FTT in adult -Continue twice a day bolus feeds during the day and continuous tube feedings and 100 mL per hour from 6 PM to 6 AM -Continue free water as prior to admission -Continue Protostat as prior to admission -Continue oral clear liquids with Gatorade preferred as prior to admission -Continue Marinol as prior to admission  Cardiomyopathy-EF 35-40% by echo Jan 2016 -Compensated without any signs of heart failure exacerbation  HTN  -Blood  pressure controlled  Chronic Thrombocytopenia -Platelets greater than 100,000 and at baseline  History 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  Family Communication: daughter at bedside Disposition Plan: Remain inpatient - SNF   Consultants:  neurology  Procedures:  none  Antibiotics:  Cefepime  Oral vancomycin  Flagyl.   HPI/Subjective: Awake, hard of hearing, no chest pain no sob, not happy with going to SNF but knows he needs rehab   Objective: Filed Vitals:   07/31/14 0618  BP: 94/74  Pulse: 68  Temp: 98 F (36.7 C)  Resp:     Intake/Output Summary (Last 24 hours) at 07/31/14 0800 Last data filed at 07/31/14 0120  Gross per 24 hour  Intake      0 ml  Output    600 ml  Net   -600 ml   Filed Weights   07/25/14 1120  Weight: 96.163 kg (212 lb)    Exam:   General: awake, NAD  Cardiovascular: S 1, S 2 RRR  Respiratory: CTA  Abdomen: Peg tube place, Bs present, soft.   Musculoskeletal: *no edema   Data Reviewed: Basic Metabolic Panel:  Recent Labs Lab 07/25/14 1205 07/26/14 0400 07/28/14 0500 07/29/14 0930 07/30/14 0500  NA 139 138 136 137 137  K 4.1 3.5 3.8 3.8 4.1  CL 104 104 102 102 104  CO2 26 26 27 27 26   GLUCOSE 97 104* 114* 119* 90  BUN 37* 34* 36* 35* 36*  CREATININE 1.17 1.14 1.07 1.11 1.15  CALCIUM 9.3 9.4 9.1 9.4 9.5   Liver Function Tests: No results for input(s): AST, ALT, ALKPHOS, BILITOT, PROT, ALBUMIN in the last 168 hours. No results for input(s):  LIPASE, AMYLASE in the last 168 hours. No results for input(s): AMMONIA in the last 168 hours. CBC:  Recent Labs Lab 07/25/14 1205 07/26/14 0400 07/28/14 0500 07/29/14 0930  WBC 4.9 4.1 4.8 5.3  HGB 10.7* 10.4* 10.1* 10.9*  HCT 33.1* 32.5* 31.7* 33.3*  MCV 87.3 87.6 88.1 87.2  PLT 120* 114* 119* 119*   Cardiac Enzymes: No results for  input(s): CKTOTAL, CKMB, CKMBINDEX, TROPONINI in the last 168 hours. BNP (last 3 results)  Recent Labs  04/09/14 1918  BNP 1434.7*    ProBNP (last 3 results) No results for input(s): PROBNP in the last 8760 hours.  CBG:  Recent Labs Lab 07/29/14 0356 07/29/14 0757 07/29/14 1237 07/29/14 1647 Aug 28, 2014 1735  GLUCAP 93 89 130* 87 103*    No results found for this or any previous visit (from the past 240 hour(s)).   Studies: Mr Kizzie Fantasia Contrast  28-Aug-2014   CLINICAL DATA:  [age] year old hypertensive male with cardiomyopathy post fall 2 weeks ago. Headache and increasing right lower extremity weakness. Generalized weakness. CT detected subdural hematoma. Subsequent encounter.  EXAM: MRI HEAD WITHOUT AND WITH CONTRAST  TECHNIQUE: Multiplanar, multiecho pulse sequences of the brain and surrounding structures were obtained without and with intravenous contrast.  CONTRAST:  22mL MULTIHANCE GADOBENATE DIMEGLUMINE 529 MG/ML IV SOLN  COMPARISON:  07/26/2014 and 07/25/2014 CT.  No comparison MR.  FINDINGS: No acute thrombotic infarct.  Posterior parafalcine subdural hematoma larger on the left with maximal thickness of 8 mm similar to the recent CT. Tiny amount of adjacent subarachnoid blood better depicted on MR.  Mild small vessel disease type changes.  Cavum septum pellucidum et vergae incidentally noted without hydrocephalus. Baseline mild global atrophy.  No intracranial enhancing lesion.  Right vertebral artery predominantly ends in a posterior inferior cerebellar artery distribution with only small contribution to the basilar artery. Left vertebral artery and basilar artery are ectatic and patent. Patent internal carotid arteries and major dural sinuses.  Minimal to mild partial opacification mastoid air cells. No obstructing lesion of eustachian tube noted. Pooling of secretions posterior superior nasopharynx the incidentally noted. Minimal mucosal thickening ethmoid sinus air cells.  Post  lens replacement. Symmetric prominent superior ophthalmic veins incidentally noted with cavernous sinus region unremarkable.  Cervical medullary junction, pituitary and pineal region unremarkable.  IMPRESSION: No acute thrombotic infarct.  Posterior parafalcine subdural hematoma larger on the left with maximal thickness of 8 mm similar to the recent CT. Tiny amount of adjacent subarachnoid blood better depicted on MR.  Mild small vessel disease type changes.  Please see above.   Electronically Signed   By: Genia Del M.D.   On: Aug 28, 2014 11:47   Mr Lumbar Spine Wo Contrast  28-Aug-2014   CLINICAL DATA:  Weakness of the RIGHT leg. RIGHT lower extremity radiculopathy. Chronic aortic graft infection.  EXAM: MRI LUMBAR SPINE WITHOUT CONTRAST  TECHNIQUE: Multiplanar, multisequence MR imaging of the lumbar spine was performed. No intravenous contrast was administered.  COMPARISON:  CT 04/29/2014.  FINDINGS: The study is technically degraded due to artifact from the aortic stent graft along the anterior aspect of the lumbar spine. This less significantly affects the axial images but obscures portions of the anterior thoracolumbar spine. The study is diagnostic.  Segmentation: The numbering convention used for this exam termed L5-S1 as the last intervertebral disc space.  Alignment: Trace anterolisthesis of L5 on S1 appears degenerative and facet mediated.  Vertebrae:  Preserved vertebral body height grossly.  Conus medullaris: Normal termination  at L1-L2.  Paraspinal tissues: Inferior pole LEFT renal cyst appears unchanged compared to prior CT. Paraspinal muscular atrophy.  Disc levels:  Disc Signal: Partial ossification of the L4-L5 disc. Portions of the discs are obscured by artifact from stent graft.  L1-L2:  Negative.  L2-L3: Facet hypertrophy and posterior ligamentum flavum redundancy. Central canal is adequately patent. Mild LEFT subarticular stenosis associated with facet arthrosis and hypertrophy. The  foramina are patent.  L3-L4: Moderate multifactorial central stenosis is present. There is bilateral right-greater-than-left subarticular stenosis with compression of the descending L4 nerves bilaterally. Probable 7 mm proteinaceous synovial cyst arising from the anterior aspect of the LEFT L3-L4 facet joint.  L4-L5: Mild central stenosis associated with facet arthrosis, ligamentum flavum redundancy and minimal disc bulging. There is mild bilateral subarticular stenosis associated with facet hypertrophy but no definite neural compression. Foramina patent.  L5-S1: Severe bilateral facet arthrosis. Central canal patent. There is right-greater-than-left bilateral subarticular stenosis. Flattening of the nerve root sleeve of the descending S1 nerves bilaterally with probable compression of the RIGHT S1 nerve roots in the lateral recess. Neural foramina appear patent.  IMPRESSION: Moderate multilevel lumbar degenerative disease, predominately affecting the facet joints. The most severe stenosis is at L3-L4, with moderate central and bilateral subarticular stenosis potentially affecting both descending L4 nerves. No findings to suggest spinal infection in this patient with chronic aortic graft infection.   Electronically Signed   By: Dereck Ligas M.D.   On: 07/30/2014 11:38    Scheduled Meds: . ceFEPIme (MAXIPIME) 2 GM IVP  2 g Intravenous Q12H  . dronabinol  2.5 mg Oral TID  . feeding supplement (JEVITY 1.2 CAL)  237 mL Per Tube BID WC  . feeding supplement (PRO-STAT SUGAR FREE 64)  30 mL Per Tube TID  . fesoterodine  4 mg Oral Daily  . free water  200 mL Per Tube QID  . LORazepam  1 mg Intravenous Once  . metroNIDAZOLE  500 mg Oral 3 times per day  . simvastatin  20 mg Oral q1800  . sodium chloride  3 mL Intravenous Q12H  . vancomycin  125 mg Oral QID   Continuous Infusions: . feeding supplement (JEVITY 1.2 CAL) 1,000 mL (07/28/14 1713)    Principal Problem:   Subdural hematoma Active  Problems:   HTN (hypertension)   Chronic Thrombocytopenia   History of DVT of lower extremity (Feb 2015) -post IVC filter   Protein-calorie malnutrition, severe   History of Bacteremia due to Pseudomonas   Stage III chronic kidney disease   Status post insertion of percutaneous endoscopic gastrostomy (PEG) tube   FTT (failure to thrive) in adult   S/P AAA repair 2014 with chronic graft infection   Cardiomyopathy-EF 35-40% by echo Jan 2016   History of Clostridium difficile colitis   Acute right-sided weakness   Subarachnoid hemorrhage   Malnutrition of moderate degree    Time spent: 25 minutes.     Eulogio Bear  [facility] Pager [phone]. If 7PM-7AM, please contact night-coverage at [email or web address], password [facility] 07/31/2014, 8:00 AM  LOS: 6 days

## 2014-07-31 NOTE — Telephone Encounter (Signed)
Not called,  Will check in thanks

## 2014-08-01 ENCOUNTER — Inpatient Hospital Stay (HOSPITAL_COMMUNITY): Payer: Medicare Other

## 2014-08-01 LAB — BASIC METABOLIC PANEL
Anion gap: 6 (ref 5–15)
BUN: 35 mg/dL — AB (ref 6–20)
CO2: 27 mmol/L (ref 22–32)
CREATININE: 1.04 mg/dL (ref 0.61–1.24)
Calcium: 9.3 mg/dL (ref 8.9–10.3)
Chloride: 105 mmol/L (ref 101–111)
GFR calc non Af Amer: >60 mL/min (ref >60)
Glucose, Bld: 91 mg/dL (ref 65–99)
POTASSIUM: 4.1 mmol/L (ref 3.5–5.1)
Sodium: 138 mmol/L (ref 135–145)

## 2014-08-01 LAB — CBC
HCT: 32.4 % — ABNORMAL LOW (ref 39.0–52.0)
Hemoglobin: 10.5 g/dL — ABNORMAL LOW (ref 13.0–17.0)
MCH: 28.2 pg (ref 26.0–34.0)
MCHC: 32.4 g/dL (ref 30.0–36.0)
MCV: 87.1 fL (ref 78.0–100.0)
PLATELETS: 106 10*3/uL — AB (ref 150–400)
RBC: 3.72 MIL/uL — ABNORMAL LOW (ref 4.22–5.81)
RDW: 16.3 % — ABNORMAL HIGH (ref 11.5–15.5)
WBC: 4.3 10*3/uL (ref 4.0–10.5)

## 2014-08-01 MED ORDER — HYDROCODONE-ACETAMINOPHEN 5-325 MG PO TABS: 1.0000 | ORAL_TABLET | Freq: Four times a day (QID) | ORAL | Status: AC | PRN

## 2014-08-01 MED ORDER — DIPHENHYDRAMINE HCL 12.5 MG/5ML PO ELIX: 12.5000 mg | ORAL_SOLUTION | Freq: Every evening | ORAL | Status: DC | PRN

## 2014-08-01 MED ORDER — DIPHENHYDRAMINE HCL 12.5 MG/5ML PO ELIX: 12.5000 mg | ORAL_SOLUTION | Freq: Every evening | ORAL | Status: AC | PRN

## 2014-08-01 MED ORDER — JEVITY 1.2 CAL PO LIQD: 1000.0000 mL | ORAL | Status: AC

## 2014-08-01 MED ORDER — PRO-STAT SUGAR FREE PO LIQD: 30.0000 mL | Freq: Three times a day (TID) | ORAL | Status: AC

## 2014-08-01 MED ORDER — HEPARIN SOD (PORK) LOCK FLUSH 100 UNIT/ML IV SOLN
250.0000 [IU] | INTRAVENOUS | Status: AC | PRN
  Administered 2014-08-01: 250 [IU]

## 2014-08-01 MED ORDER — JEVITY 1.2 CAL PO LIQD: 237.0000 mL | Freq: Two times a day (BID) | ORAL | Status: AC

## 2014-08-01 MED FILL — Fentanyl Citrate Preservative Free (PF) Inj 100 MCG/2ML: INTRAMUSCULAR | Qty: 2 | Status: AC

## 2014-08-01 MED FILL — Ondansetron HCl Inj 4 MG/2ML (2 MG/ML): INTRAMUSCULAR | Qty: 2 | Status: AC

## 2014-08-01 MED FILL — Propofol IV Emul 200 MG/20ML (10 MG/ML): INTRAVENOUS | Qty: 20 | Status: AC

## 2014-08-01 NOTE — Progress Notes (Signed)
Discharge orders received. Pt notified and educated on stroke education. Pt verbalized understanding. IV team disconnected pt's PICC for PICC to be continued at discharge. Tele removed. Pt dressed and belongings packed. Report called to [facility]. [facility] arrived to transport pt to SNF.

## 2014-08-01 NOTE — Clinical Social Work Placement (Signed)
   CLINICAL SOCIAL WORK PLACEMENT  NOTE  Date:  08/01/2014  Patient Details  Name: [name] MRN: [id number] Date of Birth: 1929/08/20  Clinical Social Work is seeking post-discharge placement for this patient at the [place] level of care (*CSW will initial, date and re-position this form in  chart as items are completed):  Yes   Patient/family provided with [facility] Work Department's list of facilities offering this level of care within the geographic area requested by the patient (or if unable, by the patient's family).  Yes   Patient/family informed of their freedom to choose among providers that offer the needed level of care, that participate in Medicare, Medicaid or managed care program needed by the patient, have an available bed and are willing to accept the patient.  Yes   Patient/family informed of [facility]'s ownership interest in [facility] and [facility], as well as of the fact that they are under no obligation to receive care at these facilities.  PASRR submitted to EDS on       PASRR number received on       Existing PASRR number confirmed on 07/26/14     FL2 transmitted to all facilities in geographic area requested by pt/family on 07/26/14     FL2 transmitted to all facilities within larger geographic area on       Patient informed that his/her managed care company has contracts with or will negotiate with certain facilities, including the following:        Yes   Patient/family informed of bed offers received.  Patient chooses bed at [facility] recommends and patient chooses bed at      Patient to be transferred to [facility] on 08/01/14.  Patient to be transferred to facility by [facility]      Patient family notified on 08/01/14 of transfer.  Name of family member notified:  Pt's daughter Patrecia Pace and wife [facility]      PHYSICIAN       Additional  Comment:    _______________________________________________ Greta Doom, LCSW 08/01/2014, 10:26 AM

## 2014-08-01 NOTE — Discharge Summary (Addendum)
Physician Discharge Summary  [name] ZWC:[id number] DOB: Sep 06, 1929 DOA: 07/25/2014  PCP: [name], MD  Admit date: 07/25/2014 Discharge date: 08/01/2014  Time spent: 35 minutes  Recommendations for Outpatient Follow-up:  1. Tube feeds 2. Partial code: CPR ok but no intubation 3. Discharge with PICC 4. Encourage PO fluid intake 5. 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   Protein-calorie malnutrition, severe   History of Bacteremia due to Pseudomonas   Stage III chronic kidney disease   Status post insertion of percutaneous endoscopic gastrostomy (PEG) tube   FTT (failure to thrive) in adult   S/P AAA repair 2014 with chronic graft infection   Cardiomyopathy-EF 35-40% by echo Jan 2016   History of Clostridium difficile colitis   Acute right-sided weakness   Subarachnoid hemorrhage   Malnutrition of moderate degree   Discharge Condition: improved  Diet recommendation: liquid  Filed Weights   07/25/14 1120  Weight: 96.163 kg (212 lb)    History of present illness:  This is a pleasant 81 year old male patient well known to the hospitalist service who was just discharged on 5/6 after being hospitalized for recurrent sepsis. 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 This infection has been determined not curable with antibiotic therapy and will require lifelong suppressive therapy to prevent relapse of fever. He is on chronic IV cefepime at home. He also has chronic C. difficile colitis (currently without diarrhea) controlled on chronic oral vancomycin. He also has a history of DVT status post IVC filter, hypertension, dyslipidemia, chronic anorexia and failure to thrive requiring  gastrostomy feeds to maintain nutritional support, chronic kidney disease, chronic anemia with chronic thrombocytopenia and macular degeneration. Prior to patient's last admission (about 2 weeks ago) patient had a fall at home. During the last hospitalization he had no apparent neurological deficits and after arrival to home patient was able to ambulate as per baseline with a walker without any focal neurological deficits although he seemed to have generalized weakness c/w deconditioning. Beginning this past Monday the patient noticed that when he was walking his right leg seemed to be weaker and he felt like he may not be able to feel the leg while he was placing leg on the floor. Over the next several days symptoms worsened and began to involve the right arm with questionable spasticity of the right arm as well as generalized weakness and trunk instability with attempts to sit upright. Today patient was unable to ambulate at all even with a walker and assistance of his family so he was brought to the ER for further evaluation.  In the ER he was afebrile and hemodynamically stable and maintaining room air sats of 99%. Neurological exam by the ER physician did reveal right-sided weakness. Noncontrasted CT of the head revealed a subdural hematoma along the posterior falx up to 8 mm thick but no other intraparenchymal brain abnormalities; was also noted to be an old lacunar infarct in the left external capsule. Chest x-ray was unremarkable with slight shifting position of the left peak with the tip at the level of the proximal superior vena cava and directed slightly laterally rather than inferiorly. Laboratory data unremarkable with electrolytes panel and CBC at baseline. Urinalysis unremarkable. At family's request infectious  disease service was notified of patient's admission. The ER physician also spoke with Dr. Vertell Limber of neurosurgery; CT of the head was reviewed by the neurosurgeon and no indications for acute  surgical intervention noted at this juncture but recommendation was to admit patient for further observation. Of note physical therapy did come to the house to complete an evaluation on 5/11 with plans to begin physical therapy later this week. Patient was noted to be weak during their evaluation. Also noted to be dragging right leg during the exam.   Hospital Course:  Subdural hematoma with associated acute right-sided weakness -Appreciate previous neurosurgery/Dr. Vertell Limber input; no acute surgical issues elucidated -Patient did have fall 2 weeks ago prior to previous admission and unclear if this is a delayed response traumatic subdural or spontaneous given patient also has chronic thrombocytopenia and platelets at times do fall below 100,000 -No pharmacological DVT prophylaxis -neurology recommending MRI brain and T, L spine: MRI brain negative for infection, posterior para falcine subdural hematoma larger on the left similar to recent CT, . MRI lumbar spine showed The most severe stenosis is at L3-L4, with moderate central and bilateral subarticular stenosis potentially affecting both descending L4 nerves. No findings to suggest spinal infection in this patient with chronic aortic graft infection. -PT recommending SNF  Diarrhea, C diff;  On chronic Vancomycin.  Diarrhea got worse during this admission.  Patient was started on flagyl 5-15- d/c'd by ID Rectal tube PRN, patient with skin irritation.  GI pathogen negative.  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/History of Clostridium difficile colitis -Continue preadmission IV cefepime and oral vancomycin   Protein-calorie malnutrition, severe/Status post insertion PEG tube/FTT in adult -Continue twice a day bolus feeds during the day and continuous tube feedings and 100 mL per hour from 6 PM to 6 AM -Continue free water as prior to admission -Continue Protostat as prior to  admission -Continue oral liquids with Gatorade preferred as prior to admission -Continue Marinol as prior to admission  Cardiomyopathy-EF 35-40% by echo Jan 2016 -Compensated without any signs of heart failure exacerbation  HTN  -Blood pressure controlled  Chronic Thrombocytopenia -Platelets greater than 100,000 and at baseline  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 NAD Cardiovascular: rrr Respiratory: clear  Discharge Instructions   Discharge Instructions    Discharge instructions    Complete by:  As directed   Continue tube feeds and liquid diet as tolerated Partial code: CPR but no intubation, Bipap ok     Increase activity slowly    Complete by:  As directed           Current Discharge Medication List    START taking these medications   Details  diphenhydrAMINE (BENADRYL) 12.5 MG/5ML elixir Take 5 mLs (12.5 mg total) by mouth at bedtime as needed for sleep. Qty: 120 mL, Refills: 0    HYDROcodone-acetaminophen (NORCO/VICODIN) 5-325 MG per tablet Take 1 tablet by mouth every 6 (six) hours as needed for moderate pain. Qty: 15 tablet, Refills: 0      CONTINUE these medications which have CHANGED   Details  Amino Acids-Protein Hydrolys (FEEDING SUPPLEMENT, PRO-STAT SUGAR FREE 64,)  LIQD Place 30 mLs into feeding tube 3 (three) times daily. Qty: 900 mL, Refills: 0    !! Nutritional Supplements (FEEDING SUPPLEMENT, JEVITY 1.2 CAL,) LIQD Place 237 mLs into feeding tube 2 (two) times daily with breakfast and lunch. Refills: 0    !! Nutritional Supplements (FEEDING SUPPLEMENT, JEVITY 1.2 CAL,) LIQD Place 1,000 mLs into feeding tube continuous. Refills: 0     !! - Potential duplicate  medications found. Please discuss with provider.    CONTINUE these medications which have NOT CHANGED   Details  acetaminophen (TYLENOL) 500 MG tablet Take 1,000 mg by mouth every 6 (six) hours as needed for mild pain or moderate pain.    bisacodyl (DULCOLAX) 10 MG suppository Place 1 suppository (10 mg total) rectally as needed for mild constipation or moderate constipation. Qty: 12 suppository, Refills: 0    ceFEPIme 2 g in dextrose 5 % 50 mL Inject 2 g into the vein every 12 (twelve) hours. Qty: 30 each, Refills: 0    dronabinol (MARINOL) 2.5 MG capsule Take 1 capsule (2.5 mg total) by mouth 3 (three) times daily. Qty: 90 capsule, Refills: 2   Associated Diagnoses: Malnutrition of moderate degree    magnesium oxide (MAG-OX) 400 (241.3 MG) MG tablet Take 0.5 tablets (200 mg total) by mouth daily.    Multiple Vitamin (MULTI-VITAMIN PO) Take 1 tablet by mouth daily.    simvastatin (ZOCOR) 20 MG tablet Take 1 tablet (20 mg total) by mouth daily. Qty: 30 tablet, Refills: 0    tolterodine (DETROL LA) 4 MG 24 hr capsule Take 4 mg by mouth 2 (two) times daily.    vancomycin (VANCOCIN) 50 mg/mL oral solution Take 2.5 mLs (125 mg total) by mouth 4 (four) times daily. Qty: 300 mL, Refills: 5   Associated Diagnoses: History of Clostridium difficile colitis    Water For Irrigation, Sterile (FREE WATER) SOLN Place 200 mLs into feeding tube 4 (four) times daily.      STOP taking these medications     aspirin EC 81 MG tablet        Allergies  Allergen Reactions  . Atorvastatin Other (See Comments)    Causes constipation  . Penicillins Other (See Comments)    Other reaction(s): RASH HIVES  Tolerates cefepime 2/22   Follow-up Information    Follow up with [name], MD In 1 week.   Specialty:  Internal Medicine   Contact information:   [place], [place] [address] [phone]        The results of significant diagnostics from this  hospitalization (including imaging, microbiology, ancillary and laboratory) are listed below for reference.    Significant Diagnostic Studies: Dg Chest 1 View  07/16/2014   CLINICAL DATA:  Upper to mid back pain after fall on Friday.  EXAM: CHEST  1 VIEW  COMPARISON:  05/08/2014  FINDINGS: There is an endoluminal descending thoracic aortic stent graft. There are unchanged moderately prominent aortic contours. There is a left upper extremity PICC line extending into the SVC. There is unchanged left hemidiaphragm elevation. There is mild interstitial coarsening and mild linear left base scarring, unchanged.  IMPRESSION: No acute cardiopulmonary findings.   Electronically Signed   By: Andreas Newport M.D.   On: 07/16/2014 00:27   Dg Chest 2 View  07/25/2014   CLINICAL DATA:  81 year old male with progressive weakness. Subsequent encounter.  EXAM: CHEST  2 VIEW  COMPARISON:  07/15/2014.  FINDINGS: Left PICC line tip has been retracted slightly  with tip at the level of the proximal superior vena cava and directed slightly laterally rather than inferiorly.  Post endovascular stenting descending thoracic aorta. Calcified ectatic ascending thoracic aorta and aortic arch.  Cardiomegaly.  Pulmonary vascular prominence most notable centrally.  Elevated left hemidiaphragm with gas-filled bowel below diaphragm. Left base subsegmental atelectasis.  No pneumothorax.  Shoulder joint degenerative changes.  IMPRESSION: Cardiomegaly  Pulmonary vascular prominence most notable centrally  Elevated left hemidiaphragm with left base subsegmental atelectasis.  Slight shifting position of the left PICC line as detailed above.  Post endovascular stent placement descending thoracic aorta with calcified ectatic ascending thoracic aorta and aortic arch.   Electronically Signed   By: Genia Del M.D.   On: 07/25/2014 12:59   Dg Thoracic Spine W/swimmers  07/16/2014   CLINICAL DATA:  Upper mid back pain after fall on Friday  EXAM:  THORACIC SPINE - 2 VIEW + SWIMMERS  COMPARISON:  None.  FINDINGS: There is no evidence of acute thoracic spine fracture. There is moderate kyphosis. There is moderately severe degenerative disc change at multiple levels. There is no bone lesion or bony destruction. The endoluminal aortic stent graft is again noted, without change and aortic contour.  IMPRESSION: Negative for acute thoracic spine fracture. Moderate kyphosis and degenerative change.   Electronically Signed   By: Andreas Newport M.D.   On: 07/16/2014 00:28   Ct Head Wo Contrast  07/26/2014   CLINICAL DATA:  History of subdural hematoma  EXAM: CT HEAD WITHOUT CONTRAST  TECHNIQUE: Contiguous axial images were obtained from the base of the skull through the vertex without intravenous contrast.  COMPARISON:  07/25/2014  FINDINGS: The bony calvarium is intact. No gross soft tissue abnormality is noted. There is again noted a focal subdural hematoma along the falx cerebri posteriorly best seen on image number 24 of series 2. It again measures approximately 8 mm. The overall appearance is stable from the prior exam. No new focal hemorrhage is seen. Cavum septum pellucidum is again identified. No findings to suggest acute infarct or space-occupying mass lesion are noted. Diffuse atrophic changes are seen.  IMPRESSION: Stable posterior subdural hematoma as described.  Atrophic changes without other acute abnormality.   Electronically Signed   By: Inez Catalina M.D.   On: 07/26/2014 13:09   Ct Head Wo Contrast  07/25/2014   CLINICAL DATA:  Progressive weakness for couple weeks, loss of strength in lower extremities, increased weakness RIGHT leg, hypertension, cardiomyopathy  EXAM: CT HEAD WITHOUT CONTRAST  TECHNIQUE: Contiguous axial images were obtained from the base of the skull through the vertex without intravenous contrast.  COMPARISON:  11/12/2010  FINDINGS: Generalized atrophy.  Cavum septum pellucidum and vergae.  Otherwise normal ventricular  morphology.  No midline shift or mass effect.  Old lacunar infarct LEFT external capsule.  Parafalcine high attenuation at posterior LEFT falx compatible with subdural hematoma measuring up to 8 mm thick.  No intraparenchymal hemorrhage, mass lesion or evidence acute infarction.  Visualized paranasal sinuses and mastoid air cells clear.  No acute osseous findings.  IMPRESSION: Subdural hematoma along the posterior LEFT falx up to 8 mm thick.  No intraparenchymal brain abnormalities.  Old lacunar infarct LEFT external capsule.  Findings called to [facility] on 07/25/2014 at 1258 hours.   Electronically Signed   By: Lavonia Dana M.D.   On: 07/25/2014 12:58   Mr Jeri Cos ZJ Contrast  07/30/2014   CLINICAL DATA:  81 year old hypertensive male with cardiomyopathy post fall 2  weeks ago. Headache and increasing right lower extremity weakness. Generalized weakness. CT detected subdural hematoma. Subsequent encounter.  EXAM: MRI HEAD WITHOUT AND WITH CONTRAST  TECHNIQUE: Multiplanar, multiecho pulse sequences of the brain and surrounding structures were obtained without and with intravenous contrast.  CONTRAST:  81mL MULTIHANCE GADOBENATE DIMEGLUMINE 529 MG/ML IV SOLN  COMPARISON:  07/26/2014 and 07/25/2014 CT.  No comparison MR.  FINDINGS: No acute thrombotic infarct.  Posterior parafalcine subdural hematoma larger on the left with maximal thickness of 8 mm similar to the recent CT. Tiny amount of adjacent subarachnoid blood better depicted on MR.  Mild small vessel disease type changes.  Cavum septum pellucidum et vergae incidentally noted without hydrocephalus. Baseline mild global atrophy.  No intracranial enhancing lesion.  Right vertebral artery predominantly ends in a posterior inferior cerebellar artery distribution with only small contribution to the basilar artery. Left vertebral artery and basilar artery are ectatic and patent. Patent internal carotid arteries and major dural sinuses.  Minimal to mild  partial opacification mastoid air cells. No obstructing lesion of eustachian tube noted. Pooling of secretions posterior superior nasopharynx the incidentally noted. Minimal mucosal thickening ethmoid sinus air cells.  Post lens replacement. Symmetric prominent superior ophthalmic veins incidentally noted with cavernous sinus region unremarkable.  Cervical medullary junction, pituitary and pineal region unremarkable.  IMPRESSION: No acute thrombotic infarct.  Posterior parafalcine subdural hematoma larger on the left with maximal thickness of 8 mm similar to the recent CT. Tiny amount of adjacent subarachnoid blood better depicted on MR.  Mild small vessel disease type changes.  Please see above.   Electronically Signed   By: Genia Del M.D.   On: 07/30/2014 11:47   Mr Lumbar Spine Wo Contrast  07/30/2014   CLINICAL DATA:  Weakness of the RIGHT leg. RIGHT lower extremity radiculopathy. Chronic aortic graft infection.  EXAM: MRI LUMBAR SPINE WITHOUT CONTRAST  TECHNIQUE: Multiplanar, multisequence MR imaging of the lumbar spine was performed. No intravenous contrast was administered.  COMPARISON:  CT 04/29/2014.  FINDINGS: The study is technically degraded due to artifact from the aortic stent graft along the anterior aspect of the lumbar spine. This less significantly affects the axial images but obscures portions of the anterior thoracolumbar spine. The study is diagnostic.  Segmentation: The numbering convention used for this exam termed L5-S1 as the last intervertebral disc space.  Alignment: Trace anterolisthesis of L5 on S1 appears degenerative and facet mediated.  Vertebrae:  Preserved vertebral body height grossly.  Conus medullaris: Normal termination at L1-L2.  Paraspinal tissues: Inferior pole LEFT renal cyst appears unchanged compared to prior CT. Paraspinal muscular atrophy.  Disc levels:  Disc Signal: Partial ossification of the L4-L5 disc. Portions of the discs are obscured by artifact from stent  graft.  L1-L2:  Negative.  L2-L3: Facet hypertrophy and posterior ligamentum flavum redundancy. Central canal is adequately patent. Mild LEFT subarticular stenosis associated with facet arthrosis and hypertrophy. The foramina are patent.  L3-L4: Moderate multifactorial central stenosis is present. There is bilateral right-greater-than-left subarticular stenosis with compression of the descending L4 nerves bilaterally. Probable 7 mm proteinaceous synovial cyst arising from the anterior aspect of the LEFT L3-L4 facet joint.  L4-L5: Mild central stenosis associated with facet arthrosis, ligamentum flavum redundancy and minimal disc bulging. There is mild bilateral subarticular stenosis associated with facet hypertrophy but no definite neural compression. Foramina patent.  L5-S1: Severe bilateral facet arthrosis. Central canal patent. There is right-greater-than-left bilateral subarticular stenosis. Flattening of the nerve root sleeve of the descending  S1 nerves bilaterally with probable compression of the RIGHT S1 nerve roots in the lateral recess. Neural foramina appear patent.  IMPRESSION: Moderate multilevel lumbar degenerative disease, predominately affecting the facet joints. The most severe stenosis is at L3-L4, with moderate central and bilateral subarticular stenosis potentially affecting both descending L4 nerves. No findings to suggest spinal infection in this patient with chronic aortic graft infection.   Electronically Signed   By: Dereck Ligas M.D.   On: 07/30/2014 11:38    Microbiology: Recent Results (from the past 240 hour(s))  Clostridium Difficile by PCR     Status: None   Collection Time: 07/30/14  7:55 PM  Result Value Ref Range Status   C difficile by pcr NEGATIVE NEGATIVE Final     Labs: Basic Metabolic Panel:  Recent Labs Lab 07/26/14 0400 07/28/14 0500 07/29/14 0930 07/30/14 0500 08/01/14 0549  NA 138 136 137 137 138  K 3.5 3.8 3.8 4.1 4.1  CL 104 102 102 104 105  CO2  26 27 27 26 27   GLUCOSE 104* 114* 119* 90 91  BUN 34* 36* 35* 36* 35*  CREATININE 1.14 1.07 1.11 1.15 1.04  CALCIUM 9.4 9.1 9.4 9.5 9.3   Liver Function Tests: No results for input(s): AST, ALT, ALKPHOS, BILITOT, PROT, ALBUMIN in the last 168 hours. No results for input(s): LIPASE, AMYLASE in the last 168 hours. No results for input(s): AMMONIA in the last 168 hours. CBC:  Recent Labs Lab 07/25/14 1205 07/26/14 0400 07/28/14 0500 07/29/14 0930 08/01/14 0549  WBC 4.9 4.1 4.8 5.3 4.3  HGB 10.7* 10.4* 10.1* 10.9* 10.5*  HCT 33.1* 32.5* 31.7* 33.3* 32.4*  MCV 87.3 87.6 88.1 87.2 87.1  PLT 120* 114* 119* 119* 106*   Cardiac Enzymes: No results for input(s): CKTOTAL, CKMB, CKMBINDEX, TROPONINI in the last 168 hours. BNP: BNP (last 3 results)  Recent Labs  04/09/14 1918  BNP 1434.7*    ProBNP (last 3 results) No results for input(s): PROBNP in the last 8760 hours.  CBG:  Recent Labs Lab 07/29/14 1237 07/29/14 1647 07/30/14 1735 07/31/14 1634 07/31/14 2034  GLUCAP 130* 87 103* 77 112*       Signed:  Somnang Mahan  [facility] 08/01/2014, 9:23 AM

## 2014-08-01 NOTE — Progress Notes (Addendum)
[facility] PROGRESS NOTE  [name] NOM:[id number] DOB: 04-26-1929 DOA: 07/25/2014 PCP: [name], MD  Assessment/Plan:  Subdural hematoma with associated acute right-sided weakness -Appreciate previous neurosurgery/Dr. Vertell Limber input; no acute surgical issues elucidated -Patient did have fall 2 weeks ago prior to previous admission and unclear if this is a delayed response traumatic subdural or spontaneous given patient also has chronic thrombocytopenia and platelets at times do fall below 100,000 -No pharmacological DVT prophylaxis -neurology recommending MRI brain and T, L spine: MRI brain negative for infection, posterior para falcine subdural hematoma larger on the left similar to recent CT, . MRI lumbar spine showed The most severe stenosis is at L3-L4, with moderate central and bilateral subarticular stenosis potentially affecting both descending L4 nerves. No findings to suggest spinal infection in this patient with chronic aortic graft infection.  -PT recommending SNF  -weakness caused by spinal stenosis  Diarrhea, C diff;  On chronic Vancomycin.  Diarrhea got worse during this admission.  Patient was started on  flagyl 5-15- d/c'd by ID Rectal tube PRN, patient with skin irritation.  GI pathogen pending.  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/History of Clostridium difficile colitis -Continue preadmission IV cefepime and oral vancomycin   Protein-calorie malnutrition, severe/Status post insertion PEG tube/FTT in adult -Continue twice a day bolus feeds during the day and continuous tube feedings and 100 mL per hour from 6 PM to 6 AM -Continue free water as prior to admission -Continue Protostat as prior to admission -Continue oral clear liquids with Gatorade preferred as prior to admission -Continue Marinol as prior to admission  Cardiomyopathy-EF 35-40% by echo Jan 2016 -Compensated without any signs of  heart failure exacerbation  HTN  -Blood pressure controlled  Chronic Thrombocytopenia -Platelets greater than 100,000 and at baseline  History 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  Family Communication: daughter at bedside 5/18 Disposition Plan: Remain inpatient - SNF   Consultants:  neurology  Procedures:  none  Antibiotics:  Cefepime  Oral vancomycin  Flagyl.   HPI/Subjective: Back feeling better   Objective: Filed Vitals:   08/01/14 0632  BP: 136/82  Pulse: 67  Temp: 97.9 F (36.6 C)  Resp: 18    Intake/Output Summary (Last 24 hours) at 08/01/14 0846 Last data filed at 08/01/14 9628  Gross per 24 hour  Intake     50 ml  Output   1275 ml  Net  -1225 ml   Filed Weights   07/25/14 1120  Weight: 96.163 kg (212 lb)    Exam:   General: awake, NAD  Cardiovascular: S 1, S 2 RRR  Respiratory: CTA  Abdomen: Peg tube place, Bs present, soft.   Musculoskeletal: no edema, moves all 4 ext   Data Reviewed: Basic Metabolic Panel:  Recent Labs Lab 07/26/14 0400 07/28/14 0500 07/29/14 0930 07/30/14 0500 08/01/14 0549  NA 138 136 137 137 138  K 3.5 3.8 3.8 4.1 4.1  CL 104 102 102 104 105  CO2 26 27 27 26 27   GLUCOSE 104* 114* 119* 90 91  BUN 34* 36* 35* 36* 35*  CREATININE 1.14 1.07 1.11 1.15 1.04  CALCIUM 9.4 9.1 9.4 9.5 9.3   Liver Function Tests: No results for input(s): AST, ALT, ALKPHOS, BILITOT, PROT, ALBUMIN in the last 168 hours. No results for input(s): LIPASE, AMYLASE in the last 168 hours.  No results for input(s): AMMONIA in the last 168 hours. CBC:  Recent Labs Lab 07/25/14 1205 07/26/14 0400 07/28/14 0500 07/29/14 0930 08/01/14 0549  WBC 4.9 4.1 4.8 5.3 4.3  HGB 10.7* 10.4* 10.1* 10.9* 10.5*  HCT 33.1* 32.5* 31.7* 33.3* 32.4*  MCV 87.3 87.6 88.1 87.2 87.1  PLT 120* 114* 119* 119* 106*    Cardiac Enzymes: No results for input(s): CKTOTAL, CKMB, CKMBINDEX, TROPONINI in the last 168 hours. BNP (last 3 results)  Recent Labs  04/09/14 1918  BNP 1434.7*    ProBNP (last 3 results) No results for input(s): PROBNP in the last 8760 hours.  CBG:  Recent Labs Lab 07/29/14 1237 07/29/14 1647 Aug 04, 2014 1735 07/31/14 1634 07/31/14 2034  GLUCAP 130* 87 103* 77 112*    Recent Results (from the past 240 hour(s))  Clostridium Difficile by PCR     Status: None   Collection Time: 08-04-2014  7:55 PM  Result Value Ref Range Status   C difficile by pcr NEGATIVE NEGATIVE Final     Studies: Mr Kizzie Fantasia Contrast  08/04/14   CLINICAL DATA:  81 year old hypertensive male with cardiomyopathy post fall 2 weeks ago. Headache and increasing right lower extremity weakness. Generalized weakness. CT detected subdural hematoma. Subsequent encounter.  EXAM: MRI HEAD WITHOUT AND WITH CONTRAST  TECHNIQUE: Multiplanar, multiecho pulse sequences of the brain and surrounding structures were obtained without and with intravenous contrast.  CONTRAST:  29mL MULTIHANCE GADOBENATE DIMEGLUMINE 529 MG/ML IV SOLN  COMPARISON:  07/26/2014 and 07/25/2014 CT.  No comparison MR.  FINDINGS: No acute thrombotic infarct.  Posterior parafalcine subdural hematoma larger on the left with maximal thickness of 8 mm similar to the recent CT. Tiny amount of adjacent subarachnoid blood better depicted on MR.  Mild small vessel disease type changes.  Cavum septum pellucidum et vergae incidentally noted without hydrocephalus. Baseline mild global atrophy.  No intracranial enhancing lesion.  Right vertebral artery predominantly ends in a posterior inferior cerebellar artery distribution with only small contribution to the basilar artery. Left vertebral artery and basilar artery are ectatic and patent. Patent internal carotid arteries and major dural sinuses.  Minimal to mild partial opacification mastoid air cells. No  obstructing lesion of eustachian tube noted. Pooling of secretions posterior superior nasopharynx the incidentally noted. Minimal mucosal thickening ethmoid sinus air cells.  Post lens replacement. Symmetric prominent superior ophthalmic veins incidentally noted with cavernous sinus region unremarkable.  Cervical medullary junction, pituitary and pineal region unremarkable.  IMPRESSION: No acute thrombotic infarct.  Posterior parafalcine subdural hematoma larger on the left with maximal thickness of 8 mm similar to the recent CT. Tiny amount of adjacent subarachnoid blood better depicted on MR.  Mild small vessel disease type changes.  Please see above.   Electronically Signed   By: Genia Del M.D.   On: Aug 04, 2014 11:47   Mr Lumbar Spine Wo Contrast  08/04/14   CLINICAL DATA:  Weakness of the RIGHT leg. RIGHT lower extremity radiculopathy. Chronic aortic graft infection.  EXAM: MRI LUMBAR SPINE WITHOUT CONTRAST  TECHNIQUE: Multiplanar, multisequence MR imaging of the lumbar spine was performed. No intravenous contrast was administered.  COMPARISON:  CT 04/29/2014.  FINDINGS: The study is technically degraded due to artifact from the aortic stent graft along the anterior aspect of the lumbar spine. This less significantly affects the axial images but obscures portions of the anterior thoracolumbar spine. The study is diagnostic.  Segmentation: The numbering convention used for this exam termed L5-S1 as the last  intervertebral disc space.  Alignment: Trace anterolisthesis of L5 on S1 appears degenerative and facet mediated.  Vertebrae:  Preserved vertebral body height grossly.  Conus medullaris: Normal termination at L1-L2.  Paraspinal tissues: Inferior pole LEFT renal cyst appears unchanged compared to prior CT. Paraspinal muscular atrophy.  Disc levels:  Disc Signal: Partial ossification of the L4-L5 disc. Portions of the discs are obscured by artifact from stent graft.  L1-L2:  Negative.  L2-L3: Facet  hypertrophy and posterior ligamentum flavum redundancy. Central canal is adequately patent. Mild LEFT subarticular stenosis associated with facet arthrosis and hypertrophy. The foramina are patent.  L3-L4: Moderate multifactorial central stenosis is present. There is bilateral right-greater-than-left subarticular stenosis with compression of the descending L4 nerves bilaterally. Probable 7 mm proteinaceous synovial cyst arising from the anterior aspect of the LEFT L3-L4 facet joint.  L4-L5: Mild central stenosis associated with facet arthrosis, ligamentum flavum redundancy and minimal disc bulging. There is mild bilateral subarticular stenosis associated with facet hypertrophy but no definite neural compression. Foramina patent.  L5-S1: Severe bilateral facet arthrosis. Central canal patent. There is right-greater-than-left bilateral subarticular stenosis. Flattening of the nerve root sleeve of the descending S1 nerves bilaterally with probable compression of the RIGHT S1 nerve roots in the lateral recess. Neural foramina appear patent.  IMPRESSION: Moderate multilevel lumbar degenerative disease, predominately affecting the facet joints. The most severe stenosis is at L3-L4, with moderate central and bilateral subarticular stenosis potentially affecting both descending L4 nerves. No findings to suggest spinal infection in this patient with chronic aortic graft infection.   Electronically Signed   By: Dereck Ligas M.D.   On: 07/30/2014 11:38    Scheduled Meds: . ceFEPIme (MAXIPIME) 2 GM IVP  2 g Intravenous Q12H  . dronabinol  2.5 mg Oral TID  . feeding supplement (JEVITY 1.2 CAL)  237 mL Per Tube BID WC  . feeding supplement (PRO-STAT SUGAR FREE 64)  30 mL Per Tube TID  . fesoterodine  4 mg Oral Daily  . free water  200 mL Per Tube QID  . LORazepam  1 mg Intravenous Once  . simvastatin  20 mg Oral q1800  . sodium chloride  3 mL Intravenous Q12H  . vancomycin  125 mg Oral Q12H   Continuous  Infusions: . feeding supplement (JEVITY 1.2 CAL) 1,000 mL (07/28/14 1713)    Principal Problem:   Subdural hematoma Active Problems:   HTN (hypertension)   Chronic Thrombocytopenia   History of DVT of lower extremity (Feb 2015) -post IVC filter   Protein-calorie malnutrition, severe   History of Bacteremia due to Pseudomonas   Stage III chronic kidney disease   Status post insertion of percutaneous endoscopic gastrostomy (PEG) tube   FTT (failure to thrive) in adult   S/P AAA repair 2014 with chronic graft infection   Cardiomyopathy-EF 35-40% by echo Jan 2016   History of Clostridium difficile colitis   Acute right-sided weakness   Subarachnoid hemorrhage   Malnutrition of moderate degree    Time spent: 25 minutes.     Eulogio Bear  [facility] Pager [phone]. If 7PM-7AM, please contact night-coverage at [email or web address], password [facility] 08/01/2014, 8:46 AM  LOS: 7 days

## 2014-08-02 ENCOUNTER — Encounter: Payer: Self-pay | Admitting: Internal Medicine

## 2014-08-05 ENCOUNTER — Telehealth: Payer: Self-pay | Admitting: *Deleted

## 2014-08-05 ENCOUNTER — Non-Acute Institutional Stay (SKILLED_NURSING_FACILITY): Payer: Medicare Other | Admitting: Internal Medicine

## 2014-08-05 ENCOUNTER — Encounter: Payer: Self-pay | Admitting: Internal Medicine

## 2014-08-05 DIAGNOSIS — I10 Essential (primary) hypertension: Secondary | ICD-10-CM

## 2014-08-05 DIAGNOSIS — I429 Cardiomyopathy, unspecified: Secondary | ICD-10-CM | POA: Diagnosis not present

## 2014-08-05 DIAGNOSIS — I82409 Acute embolism and thrombosis of unspecified deep veins of unspecified lower extremity: Secondary | ICD-10-CM | POA: Diagnosis not present

## 2014-08-05 DIAGNOSIS — I6200 Nontraumatic subdural hemorrhage, unspecified: Secondary | ICD-10-CM

## 2014-08-05 DIAGNOSIS — A0471 Enterocolitis due to Clostridium difficile, recurrent: Secondary | ICD-10-CM

## 2014-08-05 DIAGNOSIS — N183 Chronic kidney disease, stage 3 unspecified: Secondary | ICD-10-CM

## 2014-08-05 DIAGNOSIS — B965 Pseudomonas (aeruginosa) (mallei) (pseudomallei) as the cause of diseases classified elsewhere: Secondary | ICD-10-CM

## 2014-08-05 DIAGNOSIS — S065XAA Traumatic subdural hemorrhage with loss of consciousness status unknown, initial encounter: Secondary | ICD-10-CM

## 2014-08-05 DIAGNOSIS — E43 Unspecified severe protein-calorie malnutrition: Secondary | ICD-10-CM | POA: Diagnosis not present

## 2014-08-05 DIAGNOSIS — R7881 Bacteremia: Secondary | ICD-10-CM

## 2014-08-05 DIAGNOSIS — A047 Enterocolitis due to Clostridium difficile: Secondary | ICD-10-CM

## 2014-08-05 DIAGNOSIS — D696 Thrombocytopenia, unspecified: Secondary | ICD-10-CM | POA: Diagnosis not present

## 2014-08-05 DIAGNOSIS — S065X9A Traumatic subdural hemorrhage with loss of consciousness of unspecified duration, initial encounter: Secondary | ICD-10-CM

## 2014-08-05 DIAGNOSIS — R627 Adult failure to thrive: Secondary | ICD-10-CM | POA: Diagnosis not present

## 2014-08-05 NOTE — Progress Notes (Signed)
MRN: [id number] Name: FRAZIER BALFOUR  Sex: male Age: 81 y.o. DOB: 1929-07-12  [place] #: Helene Kelp Facility/Room:303 Level Of Care: SNF Provider: Inocencio Homes D Emergency Contacts: Extended Emergency Contact Information Primary Emergency Contact: Thacker,Brenda Address: [place], [place] [address] [address]itter of [place] Phone: [phone] Relation: Daughter Secondary Emergency Contact: Titus Dubin, [place] [address] [address] Litter of [employer] Phone: [phone] Relation: Daughter  Code Status:FULL   Allergies: Atorvastatin and Penicillins  Chief Complaint  Patient presents with  . New Admit To SNF    HPI: Patient is 81 y.o. male who is a pleasant 81 year old male patient well known to the hospitalist service who was just discharged on 5/6 after being hospitalized for recurrent sepsis. 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 This infection has been determined not curable with antibiotic therapy and will require lifelong suppressive therapy to prevent relapse of fever. He is on chronic IV cefepime at home. He also has chronic C. difficile colitis (currently without diarrhea) controlled on chronic oral vancomycin. He also has a history of DVT status post IVC filter, hypertension, dyslipidemia, chronic anorexia and failure to thrive requiring gastrostomy feeds to maintain nutritional support, chronic kidney disease, chronic anemia with chronic thrombocytopenia and macular degeneration. Most recent hosp with R side weakness, dx with subdural hematoma, admitted to SNF for generalized weakness, FTT.  Past Medical History  Diagnosis Date  . Hypertension   . BPH (benign prostatic hyperplasia)   . Hyperlipidemia   . AAA (abdominal aortic aneurysm) 04/2012    STENTING OF AAA IN [place]  . Junctional cardiac arrhythmia      Occurred postoperatively after urologic surgery  . Severe sinus bradycardia     Occurred postoperatively after urologic surgery  . Aortic valve disorders   . Other primary cardiomyopathies   . Dysphagia   . Status post insertion of percutaneous endoscopic gastrostomy (PEG) tube 02/11/14  . Bacteremia due to Pseudomonas   . Infected aortic graft   . DVT (deep venous thrombosis) 04/2013    s/p IVC Filter  . Cancer 2008-2009    prostate TREATED WITH RADIATION    Past Surgical History  Procedure Laterality Date  . Colectomy      FOR DIVERTICULITIS  . Appendectomy    . Hernia repair    . Bladder surgery  2008    FOR BLADDER STONE  . Total knee arthroplasty Left 1990  . Abdominal aortic aneurysm repair  04/2012  . Total knee arthroplasty Right 07/26/2012    Procedure: RIGHT TOTAL KNEE ARTHROPLASTY;  Surgeon: Tobi Bastos, MD;  Location: [facility];  Service: Orthopedics;  Laterality: Right;  . Nephrolithotomy Left 04/26/2013    Procedure: LEFT PERCUTANEOUS NEPHROLITHOTOMY ;  Surgeon: [name], MD;  Location: [facility];  Service: Urology;  Laterality: Left;  . Nephrolithotomy Left 05/03/2013    Procedure: 2ND STAGE LEFT PERCUTANEOUS NEPHROLITHOTOMY ;  Surgeon: [name], MD;  Location: [facility];  Service: Urology;  Laterality: Left;  . Esophagogastroduodenoscopy N/A 02/11/2014    OIN:OMVEH hiatal hernia. Patchy erythema of the gastric mucosa; otherwise negative EGD. Status post 78 French Microvasive PEG tube placement  . Peg placement N/A 02/11/2014    Procedure: PERCUTANEOUS ENDOSCOPIC GASTROSTOMY (PEG) PLACEMENT;  Surgeon: Daneil Dolin, MD;  Location: [facility];  Service: Endoscopy;  Laterality: N/A;  . Tee without cardioversion N/A 05/02/2014    Procedure: TRANSESOPHAGEAL ECHOCARDIOGRAM (TEE);  Surgeon: Lelon Perla, MD;  Location: [facility];  Service: Cardiovascular;  Laterality: N/A;  . Joint replacement    . Cataract extraction, bilateral Bilateral   . Vena cava filter  placement  2015    [facility] Endo 05/14/2013  . Prostate biopsy      hx/notes 04/25/2013  . Hemorrhoid surgery      hx/notes 04/25/2013  . Transurethral resection of prostate      hx/notes 04/25/2013  . Radiology with anesthesia N/A 07/30/2014    Procedure: MRI BRAIN WITH AND WITHOUT,MRI LUMBER WITHOUT CONTRAST,MRI THORACIC;  Surgeon: Medication Radiologist, MD;  Location: [place];  Service: Radiology;  Laterality: N/A;      Medication List       This list is accurate as of: 08/05/14  8:09 PM.  Always use your most recent med list.               acetaminophen 500 MG tablet  Commonly known as:  TYLENOL  Take 1,000 mg by mouth every 6 (six) hours as needed for mild pain or moderate pain.     bisacodyl 10 MG suppository  Commonly known as:  DULCOLAX  Place 1 suppository (10 mg total) rectally as needed for mild constipation or moderate constipation.     ceFEPIme 2 g in dextrose 5 % 50 mL  Inject 2 g into the vein every 12 (twelve) hours.     diphenhydrAMINE 12.5 MG/5ML elixir  Commonly known as:  BENADRYL  Take 5 mLs (12.5 mg total) by mouth at bedtime as needed for sleep.     dronabinol 2.5 MG capsule  Commonly known as:  MARINOL  Take 1 capsule (2.5 mg total) by mouth 3 (three) times daily.     feeding supplement (JEVITY 1.2 CAL) Liqd  Place 237 mLs into feeding tube 2 (two) times daily with breakfast and lunch.     feeding supplement (JEVITY 1.2 CAL) Liqd  Place 1,000 mLs into feeding tube continuous.     feeding supplement (PRO-STAT SUGAR FREE 64) Liqd  Place 30 mLs into feeding tube 3 (three) times daily.     free water Soln  Place 200 mLs into feeding tube 4 (four) times daily.     HYDROcodone-acetaminophen 5-325 MG per tablet  Commonly known as:  NORCO/VICODIN  Take 1 tablet by mouth every 6 (six) hours as needed for moderate pain.     magnesium oxide 400 (241.3 MG) MG tablet  Commonly known as:  MAG-OX  Take 0.5 tablets (200 mg total) by mouth daily.     MULTI-VITAMIN  PO  Take 1 tablet by mouth daily.     simvastatin 20 MG tablet  Commonly known as:  ZOCOR  Take 1 tablet (20 mg total) by mouth daily.     tolterodine 4 MG 24 hr capsule  Commonly known as:  DETROL LA  Take 4 mg by mouth 2 (two) times daily.     vancomycin 50 mg/mL oral solution  Commonly known as:  VANCOCIN  Take 2.5 mLs (125 mg total) by mouth 4 (four) times daily.        No orders of the defined types were placed in this encounter.     There is no immunization history on file for this patient.  History  Substance Use Topics  . Smoking status: Former Smoker -- 2.00 packs/day for 37 years  Types: Cigarettes    Start date: 02/17/1949    Quit date: 03/15/1985  . Smokeless tobacco: Former Systems developer  . Alcohol Use: No    Family history is noncontributory    Review of Systems  DATA OBTAINED: from patient, nurse GENERAL:  no fevers SKIN: No itching, rash  EYES: No eye pain, redness, discharge EARS: No earache, tinnitus, change in hearing NOSE: No congestion, drainage or bleeding  MOUTH/THROAT: No mouth or tooth pain, No sore throat RESPIRATORY: No cough, wheezing, SOB CARDIAC: No chest pain, palpitations, lower extremity edema  GI: No abdominal pain, No N/V/D or constipation, No heartburn or reflux  GU: No dysuria, frequency or urgency, or incontinence  MUSCULOSKELETAL: No unrelieved bone/joint pain NEUROLOGIC: No headache, dizziness  PSYCHIATRIC: No overt anxiety or sadness, No behavior issue.   Filed Vitals:   08/05/14 0932  BP: 117/67  Pulse: 70  Temp: 97.9 F (36.6 C)  Resp: 16    Physical Exam  GENERAL APPEARANCE: Alert, conversant, WM No acute distress, but looks tired SKIN: No diaphoresis rash HEAD: Normocephalic, atraumatic  EYES: Conjunctiva/lids clear. Pupils round, reactive. EOMs intact.  EARS: External exam WNL, canals clear. Hearing grossly normal.  NOSE: No deformity or discharge.  MOUTH/THROAT: Lips w/o lesions  RESPIRATORY: Breathing is  even, unlabored. Lung sounds are clear   CARDIOVASCULAR: Heart RRR 4/6 systolic  Murmur,no  rubs or gallops. No peripheral edema.   GASTROINTESTINAL: Abdomen is soft, non-tender, not distended w/ normal bowel sounds;Feeding tube. GENITOURINARY: Bladder non tender, not distended  MUSCULOSKELETAL: No abnormal joints or musculature NEUROLOGIC:  Cranial nerves 2-12 grossly intact. Moves all extremities  PSYCHIATRIC: quiet, no behavioral issues  Patient Active Problem List   Diagnosis Date Noted  . DVT (deep venous thrombosis) 08/05/2014  . Subarachnoid hemorrhage 07/26/2014  . Malnutrition of moderate degree 07/26/2014  . Acute right-sided weakness 07/25/2014  . Subdural hematoma 07/25/2014  . Recurrent colitis due to Clostridium difficile 05/02/2014  . History of endovascular stent graft for abdominal aortic aneurysm 05/02/2014  . S/P AAA repair 2014 with chronic graft infection 05/01/2014  . Aortic stenosis, moderate 05/01/2014  . Cardiomyopathy-EF 35-40% by echo Jan 2016 05/01/2014  . Prolonged Q-T interval on ECG 05/01/2014  . Mycotic aneurysm   . FTT (failure to thrive) in adult 03/30/2014  . Counseling regarding advanced directives and goals of care 03/01/2014  . History of Bacteremia due to Pseudomonas 02/21/2014  . Stage III chronic kidney disease 02/21/2014  . Status post insertion of percutaneous endoscopic gastrostomy (PEG) tube 02/21/2014  . Protein-calorie malnutrition, severe 02/08/2014  . 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 Chronic Thrombocytopenia 04/30/2013  . Renal stone 04/25/2013  . Osteoarthritis of right knee 07/26/2012  . HTN (hypertension) 05/19/2012    CBC    Component Value Date/Time   WBC 4.3 08/01/2014 0549   WBC 5.8 12/26/2013 1105   WBC 5.4 02/28/2007 0919   RBC 3.72* 08/01/2014 0549   RBC 3.35* 05/01/2014 1830   RBC 4.10* 12/26/2013 1105   RBC 5.21 02/28/2007 0919   HGB 10.5* 08/01/2014 0549   HGB 11.0*  12/26/2013 1105   HGB 15.0 02/28/2007 0919   HCT 32.4* 08/01/2014 0549   HCT 34.5* 12/26/2013 1105   HCT 44.0 02/28/2007 0919   PLT 106* 08/01/2014 0549   PLT 157 12/26/2013 1105   PLT 153 02/28/2007 0919   MCV 87.1 08/01/2014 0549   MCV 84 12/26/2013 1105   MCV 84.4 02/28/2007 0919  LYMPHSABS 0.4* 07/15/2014 2259   LYMPHSABS 0.9 12/26/2013 1105   LYMPHSABS 1.6 02/28/2007 0919   MONOABS 0.6 07/15/2014 2259   MONOABS 0.6 02/28/2007 0919   EOSABS 0.0 07/15/2014 2259   EOSABS 0.1 12/26/2013 1105   EOSABS 0.3 02/28/2007 0919   BASOSABS 0.0 07/15/2014 2259   BASOSABS 0.0 12/26/2013 1105   BASOSABS 0.0 02/28/2007 0919    CMP     Component Value Date/Time   NA 138 08/01/2014 0549   NA 140 10/31/2013 0839   K 4.1 08/01/2014 0549   K 3.9 10/31/2013 0839   CL 105 08/01/2014 0549   CL 103 10/31/2013 0839   CO2 27 08/01/2014 0549   CO2 27 10/31/2013 0839   GLUCOSE 91 08/01/2014 0549   GLUCOSE 93 10/31/2013 0839   BUN 35* 08/01/2014 0549   BUN 17 10/31/2013 0839   CREATININE 1.04 08/01/2014 0549   CREATININE 1.1 10/31/2013 0839   CALCIUM 9.3 08/01/2014 0549   CALCIUM 8.9 10/31/2013 0839   PROT 7.6 07/15/2014 2259   ALBUMIN 2.7* 07/15/2014 2259   AST 32 07/15/2014 2259   ALT 21 07/15/2014 2259   ALKPHOS 80 07/15/2014 2259   BILITOT 1.0 07/15/2014 2259   GFRNONAA >60 08/01/2014 0549   GFRAA >60 08/01/2014 0549    Assessment and Plan  Subdural hematoma with associated acute right-sided weakness -Appreciate previous neurosurgery/Dr. Vertell Limber input; no acute surgical issues elucidated -Patient did have fall 2 weeks ago prior to previous admission and unclear if this is a delayed response traumatic subdural or spontaneous given patient also has chronic thrombocytopenia and platelets at times do fall below 100,000 -No pharmacological DVT prophylaxis -neurology recommending MRI brain and T, L spine: MRI brain negative for infection, posterior para falcine subdural hematoma larger  on the left similar to recent CT, . MRI lumbar spine showed The most severe stenosis is at L3-L4, with moderate central and bilateral subarticular stenosis potentially affecting both descending L4 nerves. No findings to suggest spinal infection in this patient with chronic aortic graft infection. -PT recommending SNF    Recurrent colitis due to Clostridium difficile On chronic Vancomycin.  Diarrhea got worse during this admission.  Patient was started on flagyl 5-15- d/c'd by ID Rectal tube PRN, patient with skin irritation.  GI pathogen negative.   History of Bacteremia due to Pseudomonas S/P AAA repair 2014 with chronic graft infection/History of Clostridium difficile colitis -Continue preadmission IV cefepime and oral vancomycin   Protein-calorie malnutrition, severe -Continue twice a day bolus feeds during the day and continuous tube feedings and 100 mL per hour from 6 PM to 6 AM -Continue free water as prior to admission -Continue Protostat as prior to admission -Continue oral liquids with Gatorade preferred as prior to admission -Continue Marinol as prior to admission   FTT (failure to thrive) in adult -Continue twice a day bolus feeds during the day and continuous tube feedings and 100 mL per hour from 6 PM to 6 AM -Continue free water as prior to admission -Continue Protostat as prior to admission -Continue oral liquids with Gatorade preferred as prior to admission -Continue Marinol as prior to admission   Chronic Thrombocytopenia Baseline in 100,000 PLT   Cardiomyopathy-EF 35-40% by echo Jan 2016 -EF 35-40% by echo Jan 2016 -Compensated without any signs of heart failure exacerbation    HTN (hypertension) Controlled   DVT (deep venous thrombosis) extremity (Feb 2015) -post IVC filter   Stage III chronic kidney disease Stable     [place] Depaz,  Noah Delaine, MD

## 2014-08-05 NOTE — Assessment & Plan Note (Signed)
Controlled.  

## 2014-08-05 NOTE — Telephone Encounter (Signed)
I'd ask them to f/u with their PCP about this thanks

## 2014-08-05 NOTE — Assessment & Plan Note (Signed)
On chronic Vancomycin.  Diarrhea got worse during this admission.  Patient was started on flagyl 5-15- d/c'd by ID Rectal tube PRN, patient with skin irritation.  GI pathogen negative.

## 2014-08-05 NOTE — Telephone Encounter (Signed)
Patient's wife notified Myrtis Hopping

## 2014-08-05 NOTE — Assessment & Plan Note (Signed)
-  EF 35-40% by echo Jan 2016 -Compensated without any signs of heart failure exacerbation

## 2014-08-05 NOTE — Assessment & Plan Note (Signed)
-  Continue twice a day bolus feeds during the day and continuous tube feedings and 100 mL per hour from 6 PM to 6 AM -Continue free water as prior to admission -Continue Protostat as prior to admission -Continue oral liquids with Gatorade preferred as prior to admission -Continue Marinol as prior to admission

## 2014-08-05 NOTE — Telephone Encounter (Signed)
Patient's daughter called stating that marinol is not helping his appetite. She states that she had a conversation with Dr. Johnnye Sima about this and he said that there was an alternative such as Megace. She is requesting that something else be prescribed to help with his appetite.

## 2014-08-05 NOTE — Assessment & Plan Note (Signed)
Stable

## 2014-08-05 NOTE — Assessment & Plan Note (Signed)
extremity (Feb 2015) -post IVC filter

## 2014-08-05 NOTE — Assessment & Plan Note (Signed)
with associated acute right-sided weakness -Appreciate previous neurosurgery/Dr. Vertell Limber input; no acute surgical issues elucidated -Patient did have fall 2 weeks ago prior to previous admission and unclear if this is a delayed response traumatic subdural or spontaneous given patient also has chronic thrombocytopenia and platelets at times do fall below 100,000 -No pharmacological DVT prophylaxis -neurology recommending MRI brain and T, L spine: MRI brain negative for infection, posterior para falcine subdural hematoma larger on the left similar to recent CT, . MRI lumbar spine showed The most severe stenosis is at L3-L4, with moderate central and bilateral subarticular stenosis potentially affecting both descending L4 nerves. No findings to suggest spinal infection in this patient with chronic aortic graft infection. -PT recommending SNF

## 2014-08-05 NOTE — Assessment & Plan Note (Signed)
Baseline in 100,000 PLT

## 2014-08-05 NOTE — Assessment & Plan Note (Signed)
S/P AAA repair 2014 with chronic graft infection/History of Clostridium difficile colitis -Continue preadmission IV cefepime and oral vancomycin

## 2014-08-28 ENCOUNTER — Encounter (HOSPITAL_COMMUNITY): Payer: Self-pay | Admitting: Emergency Medicine

## 2014-08-28 ENCOUNTER — Emergency Department (HOSPITAL_COMMUNITY): Payer: Medicare Other

## 2014-08-28 ENCOUNTER — Emergency Department (HOSPITAL_COMMUNITY)
Admission: EM | Admit: 2014-08-28 | Discharge: 2014-08-28 | Disposition: A | Discharge status: Home / Self Care | Payer: Medicare Other | Attending: Emergency Medicine | Admitting: Emergency Medicine

## 2014-08-28 ENCOUNTER — Other Ambulatory Visit: Payer: Self-pay | Admitting: Internal Medicine

## 2014-08-28 DIAGNOSIS — Z87438 Personal history of other diseases of male genital organs: Secondary | ICD-10-CM | POA: Diagnosis not present

## 2014-08-28 DIAGNOSIS — I10 Essential (primary) hypertension: Secondary | ICD-10-CM | POA: Diagnosis not present

## 2014-08-28 DIAGNOSIS — S8991XA Unspecified injury of right lower leg, initial encounter: Secondary | ICD-10-CM | POA: Diagnosis not present

## 2014-08-28 DIAGNOSIS — Z87891 Personal history of nicotine dependence: Secondary | ICD-10-CM | POA: Diagnosis not present

## 2014-08-28 DIAGNOSIS — E785 Hyperlipidemia, unspecified: Secondary | ICD-10-CM | POA: Insufficient documentation

## 2014-08-28 DIAGNOSIS — Z8546 Personal history of malignant neoplasm of prostate: Secondary | ICD-10-CM | POA: Diagnosis not present

## 2014-08-28 DIAGNOSIS — Y9289 Other specified places as the place of occurrence of the external cause: Secondary | ICD-10-CM | POA: Insufficient documentation

## 2014-08-28 DIAGNOSIS — Z880 Allergy status to penicillin: Secondary | ICD-10-CM | POA: Insufficient documentation

## 2014-08-28 DIAGNOSIS — Y998 Other external cause status: Secondary | ICD-10-CM | POA: Diagnosis not present

## 2014-08-28 DIAGNOSIS — Z86718 Personal history of other venous thrombosis and embolism: Secondary | ICD-10-CM | POA: Diagnosis not present

## 2014-08-28 DIAGNOSIS — W1839XA Other fall on same level, initial encounter: Secondary | ICD-10-CM | POA: Diagnosis not present

## 2014-08-28 DIAGNOSIS — Y9389 Activity, other specified: Secondary | ICD-10-CM | POA: Insufficient documentation

## 2014-08-28 DIAGNOSIS — R4182 Altered mental status, unspecified: Secondary | ICD-10-CM | POA: Diagnosis present

## 2014-08-28 DIAGNOSIS — R531 Weakness: Secondary | ICD-10-CM

## 2014-08-28 DIAGNOSIS — W19XXXA Unspecified fall, initial encounter: Secondary | ICD-10-CM

## 2014-08-28 LAB — URINE MICROSCOPIC-ADD ON

## 2014-08-28 LAB — BASIC METABOLIC PANEL
ANION GAP: 7 (ref 5–15)
BUN: 40 mg/dL — ABNORMAL HIGH (ref 6–20)
CHLORIDE: 109 mmol/L (ref 101–111)
CO2: 27 mmol/L (ref 22–32)
Calcium: 10.1 mg/dL (ref 8.9–10.3)
Creatinine, Ser: 1.37 mg/dL — ABNORMAL HIGH (ref 0.61–1.24)
GFR calc Af Amer: 54 mL/min — ABNORMAL LOW (ref >60)
GFR calc non Af Amer: 46 mL/min — ABNORMAL LOW (ref >60)
GLUCOSE: 99 mg/dL (ref 65–99)
Potassium: 3.9 mmol/L (ref 3.5–5.1)
Sodium: 143 mmol/L (ref 135–145)

## 2014-08-28 LAB — URINALYSIS, ROUTINE W REFLEX MICROSCOPIC
Bilirubin Urine: NEGATIVE
Glucose, UA: NEGATIVE mg/dL
KETONES UR: NEGATIVE mg/dL
Leukocytes, UA: NEGATIVE
NITRITE: NEGATIVE
Protein, ur: 100 mg/dL — AB
SPECIFIC GRAVITY, URINE: 1.02 (ref 1.005–1.030)
Urobilinogen, UA: 0.2 mg/dL (ref 0.0–1.0)
pH: 6 (ref 5.0–8.0)

## 2014-08-28 LAB — CBC
HCT: 35 % — ABNORMAL LOW (ref 39.0–52.0)
HEMOGLOBIN: 11.4 g/dL — AB (ref 13.0–17.0)
MCH: 28.6 pg (ref 26.0–34.0)
MCHC: 32.6 g/dL (ref 30.0–36.0)
MCV: 87.7 fL (ref 78.0–100.0)
Platelets: 121 10*3/uL — ABNORMAL LOW (ref 150–400)
RBC: 3.99 MIL/uL — ABNORMAL LOW (ref 4.22–5.81)
RDW: 15.6 % — ABNORMAL HIGH (ref 11.5–15.5)
WBC: 4.5 10*3/uL (ref 4.0–10.5)

## 2014-08-28 LAB — I-STAT CG4 LACTIC ACID, ED
Lactic Acid, Venous: 0.83 mmol/L (ref 0.5–2.0)
Lactic Acid, Venous: 1.27 mmol/L (ref 0.5–2.0)

## 2014-08-28 NOTE — ED Notes (Signed)
Pt refuses to be on BP monitor.

## 2014-08-28 NOTE — ED Notes (Signed)
Per pts paperwork from facility he is also coming in for altered mental status.

## 2014-08-28 NOTE — ED Provider Notes (Signed)
CSN: [id number]     Arrival date & time 08/28/14  1427 History   None    Chief Complaint  Patient presents with  . Fatigue  . Fall  . Altered Mental Status     (Consider location/radiation/quality/duration/timing/severity/associated sxs/prior Treatment) Patient is a 81 y.o. male presenting with fall.  Fall The current episode started yesterday. The problem occurs intermittently. The problem has been resolved. Associated symptoms include arthralgias and fatigue. Pertinent negatives include no abdominal pain, change in bowel habit, chest pain, chills, congestion, coughing, diaphoresis, fever, headaches, myalgias, nausea, neck pain, numbness, rash, sore throat, urinary symptoms, vertigo, visual change, vomiting or weakness. Nothing aggravates the symptoms. He has tried nothing for the symptoms.    Past Medical History  Diagnosis Date  . Hypertension   . BPH (benign prostatic hyperplasia)   . Hyperlipidemia   . AAA (abdominal aortic aneurysm) 04/2012    STENTING OF AAA IN [place]  . Junctional cardiac arrhythmia     Occurred postoperatively after urologic surgery  . Severe sinus bradycardia     Occurred postoperatively after urologic surgery  . Aortic valve disorders   . Other primary cardiomyopathies   . Dysphagia   . Status post insertion of percutaneous endoscopic gastrostomy (PEG) tube 02/11/14  . Bacteremia due to Pseudomonas   . Infected aortic graft   . DVT (deep venous thrombosis) 04/2013    s/p IVC Filter  . Cancer 2008-2009    prostate TREATED WITH RADIATION   Past Surgical History  Procedure Laterality Date  . Colectomy      FOR DIVERTICULITIS  . Appendectomy    . Hernia repair    . Bladder surgery  2008    FOR BLADDER STONE  . Total knee arthroplasty Left 1990  . Abdominal aortic aneurysm repair  04/2012  . Total knee arthroplasty Right 07/26/2012    Procedure: RIGHT TOTAL KNEE ARTHROPLASTY;  Surgeon: Tobi Bastos, MD;  Location: [facility];  Service:  Orthopedics;  Laterality: Right;  . Nephrolithotomy Left 04/26/2013    Procedure: LEFT PERCUTANEOUS NEPHROLITHOTOMY ;  Surgeon: [name], MD;  Location: [facility];  Service: Urology;  Laterality: Left;  . Nephrolithotomy Left 05/03/2013    Procedure: 2ND STAGE LEFT PERCUTANEOUS NEPHROLITHOTOMY ;  Surgeon: [name], MD;  Location: [facility];  Service: Urology;  Laterality: Left;  . Esophagogastroduodenoscopy N/A 02/11/2014    BMW:UXLKG hiatal hernia. Patchy erythema of the gastric mucosa; otherwise negative EGD. Status post 59 French Microvasive PEG tube placement  . Peg placement N/A 02/11/2014    Procedure: PERCUTANEOUS ENDOSCOPIC GASTROSTOMY (PEG) PLACEMENT;  Surgeon: Daneil Dolin, MD;  Location: [facility];  Service: Endoscopy;  Laterality: N/A;  . Tee without cardioversion N/A 05/02/2014    Procedure: TRANSESOPHAGEAL ECHOCARDIOGRAM (TEE);  Surgeon: Lelon Perla, MD;  Location: [facility];  Service: Cardiovascular;  Laterality: N/A;  . Joint replacement    . Cataract extraction, bilateral Bilateral   . Vena cava filter placement  2015    Archie Endo 05/14/2013  . Prostate biopsy      hx/notes 04/25/2013  . Hemorrhoid surgery      hx/notes 04/25/2013  . Transurethral resection of prostate      hx/notes 04/25/2013  . Radiology with anesthesia N/A 07/30/2014    Procedure: MRI BRAIN WITH AND WITHOUT,MRI LUMBER WITHOUT CONTRAST,MRI THORACIC;  Surgeon: Medication Radiologist, MD;  Location: [place];  Service: Radiology;  Laterality: N/A;   Family History  Problem Relation Age of Onset  . Lung  cancer Father    History  Substance Use Topics  . Smoking status: Former Smoker -- 2.00 packs/day for 37 years    Types: Cigarettes    Start date: 02/17/1949    Quit date: 03/15/1985  . Smokeless tobacco: Former Systems developer  . Alcohol Use: No    Review of Systems  Constitutional: Positive for fatigue. Negative for fever, chills, diaphoresis and appetite change.  HENT: Negative for congestion, ear pain,  facial swelling, mouth sores and sore throat.   Eyes: Negative for visual disturbance.  Respiratory: Negative for cough, chest tightness and shortness of breath.   Cardiovascular: Negative for chest pain and palpitations.  Gastrointestinal: Negative for nausea, vomiting, abdominal pain, diarrhea, blood in stool and change in bowel habit.  Endocrine: Negative for cold intolerance and heat intolerance.  Genitourinary: Negative for frequency, decreased urine volume and difficulty urinating.  Musculoskeletal: Positive for arthralgias. Negative for myalgias, back pain, neck pain and neck stiffness.  Skin: Negative for rash.  Neurological: Negative for dizziness, vertigo, weakness, light-headedness, numbness and headaches.  All other systems reviewed and are negative.     Allergies  Atorvastatin and Penicillins  Home Medications   Prior to Admission medications   Medication Sig Start Date End Date Taking? Authorizing Provider  acetaminophen (TYLENOL) 500 MG tablet Take 1,000 mg by mouth every 6 (six) hours as needed for mild pain or moderate pain.   Yes Historical Provider, MD  Amino Acids-Protein Hydrolys (FEEDING SUPPLEMENT, PRO-STAT SUGAR FREE 64,) LIQD Place 30 mLs into feeding tube 3 (three) times daily. 08/01/14  Yes Geradine Girt, DO  bisacodyl (DULCOLAX) 10 MG suppository Place 1 suppository (10 mg total) rectally as needed for mild constipation or moderate constipation. 05/07/14  Yes Silver Huguenin Elgergawy, MD  ceFEPIme 2 g in dextrose 5 % 50 mL Inject 2 g into the vein every 12 (twelve) hours. 07/19/14  Yes Modena Jansky, MD  diphenhydrAMINE (BENADRYL) 12.5 MG/5ML elixir Take 5 mLs (12.5 mg total) by mouth at bedtime as needed for sleep. 08/01/14  Yes Geradine Girt, DO  HYDROcodone-acetaminophen (NORCO/VICODIN) 5-325 MG per tablet Take 1 tablet by mouth every 6 (six) hours as needed for moderate pain. 08/01/14  Yes Geradine Girt, DO  magnesium oxide (MAG-OX) 400 (241.3 MG) MG tablet Take  0.5 tablets (200 mg total) by mouth daily. 04/23/14  Yes Geradine Girt, DO  mirtazapine (REMERON) 7.5 MG tablet Take 7.5 mg by mouth at bedtime.   Yes Historical Provider, MD  Multiple Vitamin (MULTI-VITAMIN PO) Take 1 tablet by mouth daily.   Yes Historical Provider, MD  Nutritional Supplements (FEEDING SUPPLEMENT, JEVITY 1.2 CAL,) LIQD Place 237 mLs into feeding tube 2 (two) times daily with breakfast and lunch. 08/01/14  Yes Geradine Girt, DO  simvastatin (ZOCOR) 20 MG tablet Take 1 tablet (20 mg total) by mouth daily. Patient taking differently: Take 20 mg by mouth daily at 6 PM.  05/07/14  Yes Albertine Patricia, MD  tolterodine (DETROL LA) 4 MG 24 hr capsule Take 4 mg by mouth 2 (two) times daily.   Yes Historical Provider, MD  vancomycin (VANCOCIN) 50 mg/mL oral solution Take 2.5 mLs (125 mg total) by mouth 4 (four) times daily. 06/03/14  Yes Michel Bickers, MD  Water For Irrigation, Sterile (FREE WATER) SOLN Place 200 mLs into feeding tube 4 (four) times daily. 05/07/14  Yes Albertine Patricia, MD  dronabinol (MARINOL) 2.5 MG capsule Take 1 capsule (2.5 mg total) by mouth 3 (three)  times daily. Patient not taking: Reported on 08/28/2014 06/07/14   [name], MD  Nutritional Supplements (FEEDING SUPPLEMENT, JEVITY 1.2 CAL,) LIQD Place 1,000 mLs into feeding tube continuous. 08/01/14   Jessica U Vann, DO   BP 156/106 mmHg  Pulse 77  Temp(Src) 98.2 F (36.8 C) (Oral)  Resp 18  SpO2 96% Physical Exam  Constitutional: He is oriented to person, place, and time. He appears well-nourished. No distress.  HENT:  Head: Normocephalic and atraumatic.  Right Ear: External ear normal.  Left Ear: External ear normal.  Eyes: Pupils are equal, round, and reactive to light. Right eye exhibits no discharge. Left eye exhibits no discharge. No scleral icterus.  Neck: Normal range of motion. Neck supple.  Cardiovascular: Normal rate.  Exam reveals no gallop and no friction rub.   No murmur  heard. Pulmonary/Chest: Effort normal and breath sounds normal. No stridor. No respiratory distress. He has no wheezes. He has no rales. He exhibits no tenderness.  Abdominal: Soft. He exhibits no distension and no mass. There is no tenderness. There is no rebound and no guarding.  PEG tube in place in the mid abdomen  Musculoskeletal: He exhibits no edema or tenderness.  Right lower extremity externally rotated and painful with range of motion  Neurological: He is alert and oriented to person, place, and time.  Skin: Skin is warm and dry. No rash noted. He is not diaphoretic. No erythema.    ED Course  Procedures (including critical care time) Labs Review Labs Reviewed  CBC - Abnormal; Notable for the following:    RBC 3.99 (*)    Hemoglobin 11.4 (*)    HCT 35.0 (*)    RDW 15.6 (*)    Platelets 121 (*)    All other components within normal limits  BASIC METABOLIC PANEL - Abnormal; Notable for the following:    BUN 40 (*)    Creatinine, Ser 1.37 (*)    GFR calc non Af Amer 46 (*)    GFR calc Af Amer 54 (*)    All other components within normal limits  URINALYSIS, ROUTINE W REFLEX MICROSCOPIC (NOT AT [facility]) - Abnormal; Notable for the following:    Color, Urine AMBER (*)    APPearance CLOUDY (*)    Hgb urine dipstick MODERATE (*)    Protein, ur 100 (*)    All other components within normal limits  URINE MICROSCOPIC-ADD ON - Abnormal; Notable for the following:    Bacteria, UA FEW (*)    All other components within normal limits  I-STAT CG4 LACTIC ACID, ED  I-STAT CG4 LACTIC ACID, ED    Imaging Review Dg Chest 2 View  08/28/2014   CLINICAL DATA:  Altered mental status. Fatigue. The patient fell today.  EXAM: CHEST  2 VIEW  COMPARISON:  Radiographs dated 08/01/2014 and 07/25/2014  FINDINGS: Heart size and pulmonary vascularity are normal. Tortuosity of the thoracic aorta. Thoracic aortic stent graft in place. PICC appears in good position. Lungs are clear. No appreciable acute  osseous abnormality. Osteopenia.  IMPRESSION: No acute abnormalities.   Electronically Signed   By: Lorriane Shire M.D.   On: 08/28/2014 16:24   Ct Head Wo Contrast  08/28/2014   CLINICAL DATA:  Fall, weakness  EXAM: CT HEAD WITHOUT CONTRAST  CT CERVICAL SPINE WITHOUT CONTRAST  TECHNIQUE: Multidetector CT imaging of the head and cervical spine was performed following the standard protocol without intravenous contrast. Multiplanar CT image reconstructions of the cervical spine were  also generated.  COMPARISON:  MRI brain dated 05/17 2016  FINDINGS: CT HEAD FINDINGS  No evidence of parenchymal hemorrhage or extra-axial fluid collection. Prior subdural hematoma along the posterior falx is no longer visualized.  No mass lesion, mass effect, or midline shift.  No CT evidence of acute infarction.  Subcortical white matter and periventricular small vessel ischemic changes. Intracranial atherosclerosis.  Global cortical atrophy. Secondary prominence of the bifrontal extra-axial spaces, left greater than right. Mild secondary prominence of the lateral ventricles. Septum cavum pellucidum.  The visualized paranasal sinuses are essentially clear. Partial opacification of the left mastoid air cells.  No evidence of calvarial fracture.  CT CERVICAL SPINE FINDINGS  Exaggerated cervical lordosis.  No evidence of fracture or dislocation. Vertebral body heights are maintained. Dens appears intact.  No prevertebral soft tissue swelling.  Moderate multilevel degenerative changes.  Visualized thyroid is unremarkable.  Visualized lung apices are notable for emphysematous changes.  IMPRESSION: No evidence of acute intracranial abnormality. Atrophy with small vessel ischemic changes.  No evidence of traumatic injury to the cervical spine. Moderate degenerative changes.   Electronically Signed   By: Julian Hy M.D.   On: 08/28/2014 17:24   Ct Cervical Spine Wo Contrast  08/28/2014   CLINICAL DATA:  Fall, weakness  EXAM: CT HEAD  WITHOUT CONTRAST  CT CERVICAL SPINE WITHOUT CONTRAST  TECHNIQUE: Multidetector CT imaging of the head and cervical spine was performed following the standard protocol without intravenous contrast. Multiplanar CT image reconstructions of the cervical spine were also generated.  COMPARISON:  MRI brain dated 05/17 2016  FINDINGS: CT HEAD FINDINGS  No evidence of parenchymal hemorrhage or extra-axial fluid collection. Prior subdural hematoma along the posterior falx is no longer visualized.  No mass lesion, mass effect, or midline shift.  No CT evidence of acute infarction.  Subcortical white matter and periventricular small vessel ischemic changes. Intracranial atherosclerosis.  Global cortical atrophy. Secondary prominence of the bifrontal extra-axial spaces, left greater than right. Mild secondary prominence of the lateral ventricles. Septum cavum pellucidum.  The visualized paranasal sinuses are essentially clear. Partial opacification of the left mastoid air cells.  No evidence of calvarial fracture.  CT CERVICAL SPINE FINDINGS  Exaggerated cervical lordosis.  No evidence of fracture or dislocation. Vertebral body heights are maintained. Dens appears intact.  No prevertebral soft tissue swelling.  Moderate multilevel degenerative changes.  Visualized thyroid is unremarkable.  Visualized lung apices are notable for emphysematous changes.  IMPRESSION: No evidence of acute intracranial abnormality. Atrophy with small vessel ischemic changes.  No evidence of traumatic injury to the cervical spine. Moderate degenerative changes.   Electronically Signed   By: Julian Hy M.D.   On: 08/28/2014 17:24   Dg Hip Unilat With Pelvis 2-3 Views Right  08/28/2014   CLINICAL DATA:  [place] today, unresponsive.  EXAM: RIGHT HIP (WITH PELVIS) 2-3 VIEWS  COMPARISON:  CT scan 04/29/2014  FINDINGS: Both hips are normally located. Moderate degenerative changes bilaterally. No acute fracture is identified. The pubic symphysis and SI  joints are grossly intact. No definite pelvic fractures. Aortoiliac stent graft is noted.  IMPRESSION: No acute bony findings.   Electronically Signed   By: Marijo Sanes M.D.   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 Difficile, and recent fall resulting in subdural hematoma  presents for evaluation after a fall from his bed last night. Laboratory workup revealed no evidence of infection, leukocytosis, electrolyte derangements, lactic acidosis. CT head revealed resolution of prior subdural hematoma with no evidence of acute hemorrhage. CT C-spine were no evidence of acute injury. Plain film of the right hip with no evidence of dislocation or fracture. Patient is at baseline, oriented to self, time, place, disposition. Findings were discussed with the patient's daughter who is agreeable with discharge back to facility. Patient is in stable condition and safe for discharge back to his facility with strict return precautions.   Patient seen in conjunction with Dr. Tamera Punt.  Sibyl Parr, M.D. Resident  Final diagnoses:  Orene Desanctis, MD 08/29/14 [place], MD 08/31/14 [phone]

## 2014-08-28 NOTE — ED Notes (Addendum)
Per EMS, pt coming from [facility] for evaluation of generalized weakness x 3 days and an unwitnessed fall this morning. Pt is not on blood thinners and is not complaining of any pain from the fall. Pt is being treated with IV antibiotics for bacteremia. Pt alert to self only at this time. This is baseline per the facility. 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 CBG: 116, HR: 80's, BP: 136/90

## 2014-08-28 NOTE — Discharge Instructions (Signed)

## 2014-08-31 ENCOUNTER — Encounter (HOSPITAL_COMMUNITY): Payer: Self-pay | Admitting: Emergency Medicine

## 2014-08-31 ENCOUNTER — Emergency Department (HOSPITAL_COMMUNITY)
Admission: EM | Admit: 2014-08-31 | Discharge: 2014-09-02 | Disposition: A | Discharge status: Home / Self Care | Payer: Medicare Other | Attending: Emergency Medicine | Admitting: Emergency Medicine

## 2014-08-31 ENCOUNTER — Emergency Department (HOSPITAL_COMMUNITY): Payer: Medicare Other

## 2014-08-31 DIAGNOSIS — F0391 Unspecified dementia with behavioral disturbance: Secondary | ICD-10-CM | POA: Diagnosis not present

## 2014-08-31 DIAGNOSIS — Z87448 Personal history of other diseases of urinary system: Secondary | ICD-10-CM | POA: Diagnosis not present

## 2014-08-31 DIAGNOSIS — Z515 Encounter for palliative care: Secondary | ICD-10-CM | POA: Diagnosis not present

## 2014-08-31 DIAGNOSIS — Z880 Allergy status to penicillin: Secondary | ICD-10-CM | POA: Diagnosis not present

## 2014-08-31 DIAGNOSIS — Z86718 Personal history of other venous thrombosis and embolism: Secondary | ICD-10-CM | POA: Insufficient documentation

## 2014-08-31 DIAGNOSIS — Z792 Long term (current) use of antibiotics: Secondary | ICD-10-CM | POA: Insufficient documentation

## 2014-08-31 DIAGNOSIS — Z79899 Other long term (current) drug therapy: Secondary | ICD-10-CM | POA: Insufficient documentation

## 2014-08-31 DIAGNOSIS — R531 Weakness: Secondary | ICD-10-CM | POA: Insufficient documentation

## 2014-08-31 DIAGNOSIS — Z8546 Personal history of malignant neoplasm of prostate: Secondary | ICD-10-CM | POA: Insufficient documentation

## 2014-08-31 DIAGNOSIS — R5383 Other fatigue: Secondary | ICD-10-CM | POA: Diagnosis not present

## 2014-08-31 DIAGNOSIS — Z923 Personal history of irradiation: Secondary | ICD-10-CM | POA: Diagnosis not present

## 2014-08-31 DIAGNOSIS — Z87440 Personal history of urinary (tract) infections: Secondary | ICD-10-CM | POA: Diagnosis not present

## 2014-08-31 DIAGNOSIS — R630 Anorexia: Secondary | ICD-10-CM | POA: Diagnosis not present

## 2014-08-31 DIAGNOSIS — G479 Sleep disorder, unspecified: Secondary | ICD-10-CM | POA: Diagnosis not present

## 2014-08-31 DIAGNOSIS — B965 Pseudomonas (aeruginosa) (mallei) (pseudomallei) as the cause of diseases classified elsewhere: Secondary | ICD-10-CM | POA: Diagnosis not present

## 2014-08-31 DIAGNOSIS — Z9842 Cataract extraction status, left eye: Secondary | ICD-10-CM | POA: Diagnosis not present

## 2014-08-31 DIAGNOSIS — R4182 Altered mental status, unspecified: Secondary | ICD-10-CM | POA: Diagnosis present

## 2014-08-31 DIAGNOSIS — M549 Dorsalgia, unspecified: Secondary | ICD-10-CM | POA: Insufficient documentation

## 2014-08-31 DIAGNOSIS — I10 Essential (primary) hypertension: Secondary | ICD-10-CM | POA: Diagnosis not present

## 2014-08-31 DIAGNOSIS — R110 Nausea: Secondary | ICD-10-CM | POA: Diagnosis not present

## 2014-08-31 DIAGNOSIS — Z87891 Personal history of nicotine dependence: Secondary | ICD-10-CM | POA: Diagnosis not present

## 2014-08-31 DIAGNOSIS — Z9841 Cataract extraction status, right eye: Secondary | ICD-10-CM | POA: Insufficient documentation

## 2014-08-31 DIAGNOSIS — E785 Hyperlipidemia, unspecified: Secondary | ICD-10-CM | POA: Diagnosis not present

## 2014-08-31 DIAGNOSIS — R34 Anuria and oliguria: Secondary | ICD-10-CM | POA: Insufficient documentation

## 2014-08-31 DIAGNOSIS — R410 Disorientation, unspecified: Secondary | ICD-10-CM | POA: Diagnosis not present

## 2014-08-31 DIAGNOSIS — R7881 Bacteremia: Secondary | ICD-10-CM | POA: Diagnosis not present

## 2014-08-31 DIAGNOSIS — I716 Thoracoabdominal aortic aneurysm, without rupture: Secondary | ICD-10-CM | POA: Diagnosis not present

## 2014-08-31 LAB — CBC WITH DIFFERENTIAL/PLATELET
Basophils Absolute: 0 10*3/uL (ref 0.0–0.1)
Basophils Relative: 1 % (ref 0–1)
EOS ABS: 0 10*3/uL (ref 0.0–0.7)
EOS PCT: 0 % (ref 0–5)
HCT: 36.6 % — ABNORMAL LOW (ref 39.0–52.0)
Hemoglobin: 11.8 g/dL — ABNORMAL LOW (ref 13.0–17.0)
Lymphocytes Relative: 20 % (ref 12–46)
Lymphs Abs: 0.9 10*3/uL (ref 0.7–4.0)
MCH: 28.2 pg (ref 26.0–34.0)
MCHC: 32.2 g/dL (ref 30.0–36.0)
MCV: 87.6 fL (ref 78.0–100.0)
Monocytes Absolute: 0.5 10*3/uL (ref 0.1–1.0)
Monocytes Relative: 12 % (ref 3–12)
Neutro Abs: 3 10*3/uL (ref 1.7–7.7)
Neutrophils Relative %: 68 % (ref 43–77)
PLATELETS: 96 10*3/uL — AB (ref 150–400)
RBC: 4.18 MIL/uL — ABNORMAL LOW (ref 4.22–5.81)
RDW: 15.5 % (ref 11.5–15.5)
WBC: 4.5 10*3/uL (ref 4.0–10.5)

## 2014-08-31 LAB — URINE MICROSCOPIC-ADD ON

## 2014-08-31 LAB — URINALYSIS, ROUTINE W REFLEX MICROSCOPIC
Bilirubin Urine: NEGATIVE
Glucose, UA: NEGATIVE mg/dL
KETONES UR: NEGATIVE mg/dL
LEUKOCYTES UA: NEGATIVE
NITRITE: NEGATIVE
Protein, ur: 100 mg/dL — AB
SPECIFIC GRAVITY, URINE: 1.017 (ref 1.005–1.030)
Urobilinogen, UA: 0.2 mg/dL (ref 0.0–1.0)
pH: 6.5 (ref 5.0–8.0)

## 2014-08-31 LAB — COMPREHENSIVE METABOLIC PANEL
ALT: 24 U/L (ref 17–63)
AST: 31 U/L (ref 15–41)
Albumin: 2.4 g/dL — ABNORMAL LOW (ref 3.5–5.0)
Alkaline Phosphatase: 85 U/L (ref 38–126)
Anion gap: 8 (ref 5–15)
BUN: 43 mg/dL — ABNORMAL HIGH (ref 6–20)
CHLORIDE: 108 mmol/L (ref 101–111)
CO2: 25 mmol/L (ref 22–32)
Calcium: 10.1 mg/dL (ref 8.9–10.3)
Creatinine, Ser: 1.43 mg/dL — ABNORMAL HIGH (ref 0.61–1.24)
GFR calc Af Amer: 51 mL/min — ABNORMAL LOW (ref >60)
GFR, EST NON AFRICAN AMERICAN: 44 mL/min — AB (ref >60)
Glucose, Bld: 98 mg/dL (ref 65–99)
POTASSIUM: 3.6 mmol/L (ref 3.5–5.1)
Sodium: 141 mmol/L (ref 135–145)
Total Bilirubin: 0.6 mg/dL (ref 0.3–1.2)
Total Protein: 7.1 g/dL (ref 6.5–8.1)

## 2014-08-31 LAB — I-STAT CG4 LACTIC ACID, ED: Lactic Acid, Venous: 0.73 mmol/L (ref 0.5–2.0)

## 2014-08-31 LAB — CBG MONITORING, ED: Glucose-Capillary: 78 mg/dL (ref 65–99)

## 2014-08-31 LAB — VANCOMYCIN, RANDOM

## 2014-08-31 LAB — I-STAT TROPONIN, ED: Troponin i, poc: 0.02 ng/mL (ref 0.00–0.08)

## 2014-08-31 LAB — CLOSTRIDIUM DIFFICILE BY PCR: CDIFFPCR: NEGATIVE

## 2014-08-31 MED ORDER — ACETAMINOPHEN 500 MG PO TABS
1000.0000 mg | ORAL_TABLET | Freq: Four times a day (QID) | ORAL | Status: DC | PRN
  Administered 2014-09-01: 1000 mg via ORAL
  Filled 2014-08-31: qty 2

## 2014-08-31 MED ORDER — FREE WATER
200.0000 mL | Freq: Four times a day (QID) | Status: DC
  Administered 2014-08-31 – 2014-09-01 (×4): 200 mL

## 2014-08-31 MED ORDER — DEXTROSE 5 % IV SOLN
2.0000 g | Freq: Two times a day (BID) | INTRAVENOUS | Status: DC
  Administered 2014-08-31: 2 g via INTRAVENOUS
  Filled 2014-08-31: qty 2

## 2014-08-31 MED ORDER — RISPERIDONE 0.5 MG PO TBDP
0.5000 mg | ORAL_TABLET | Freq: Two times a day (BID) | ORAL | Status: DC | PRN
  Administered 2014-09-01: 0.5 mg via ORAL
  Filled 2014-08-31: qty 1

## 2014-08-31 MED ORDER — DEXTROSE 5 % IV SOLN
1.0000 g | Freq: Two times a day (BID) | INTRAVENOUS | Status: DC
  Administered 2014-09-01: 1 g via INTRAVENOUS
  Filled 2014-08-31 (×4): qty 1

## 2014-08-31 MED ORDER — JEVITY 1.2 CAL PO LIQD
237.0000 mL | Freq: Two times a day (BID) | ORAL | Status: DC
  Administered 2014-09-01 (×2): 237 mL
  Filled 2014-08-31 (×2): qty 237

## 2014-08-31 MED ORDER — JEVITY 1.2 CAL PO LIQD
1000.0000 mL | ORAL | Status: DC
  Administered 2014-08-31: 1000 mL
  Filled 2014-08-31 (×2): qty 1000

## 2014-08-31 MED ORDER — VANCOMYCIN 50 MG/ML ORAL SOLUTION
125.0000 mg | Freq: Four times a day (QID) | ORAL | Status: DC
  Administered 2014-08-31 – 2014-09-01 (×4): 125 mg via ORAL
  Filled 2014-08-31 (×8): qty 2.5

## 2014-08-31 MED ORDER — JEVITY 1.2 CAL PO LIQD
1000.0000 mL | ORAL | Status: DC
  Filled 2014-08-31: qty 1000

## 2014-08-31 NOTE — ED Notes (Signed)
Contacting portable for feeding pump and SPD for feeding tube and pharmacy for [place].

## 2014-08-31 NOTE — ED Notes (Signed)
Family member giving pt water. Family member informed to not give pt anything by mouth until given permission by MD or RN. Family member states that she is still going to give pt water.

## 2014-08-31 NOTE — ED Notes (Signed)
Called Ford at [facility] regarding pt's TTS assessment pending. Ford to let PA [employer] know of pending need.

## 2014-08-31 NOTE — ED Provider Notes (Signed)
CSN: [id number]     Arrival date & time 08/31/14  7829 History   First MD Initiated Contact with Patient 08/31/14 [phone]     Chief Complaint  Patient presents with  . Altered Mental Status      HPI  Patient presents via EMS, accompanied by his daughter.  Report is that for the last almost 2 weeks has been lethargic and confused. This is worse in the last few days to the point that he has become combative and required Ativan yesterday.    Significant history for receiving aortic endograft at [facility] 11 months ago. He has had chronic pseudomonal bacteremia since that time and recurrent C. difficile enterocolitis. He is on twice a day cefepime, and 4 times a day vancomycin via his PEG feeding tube.  Daughter states that he has not had bacteremia for several months. She states that although his diarrhea continues the last time it was checked for C. difficile had resolved.  Seen and evaluated 3 days ago after a fall from bed. He had a CT scan that showed resolution of the subdural hematoma that he suffered several months ago  Per the daughter's report he is will not eat. Although she states he will take liquids and medicines by mouth. His medications are given via his PEG tube. He is fed Jevity via infusion pump through his PEG at night.    Daughter states that he sleeps almost all the time. The only new medicines are:  1)  Ativan topical gel and was given only one dose yesterday.  2) Remeron - Which was started 10 days ago for  appetite stimulant.  He normally talks and is awake and conversant. As recently as May 1 he was driving his vehicle around his property before having an admission and being discharged to [place] facility.  Past Medical History  Diagnosis Date  . Hypertension   . BPH (benign prostatic hyperplasia)   . Hyperlipidemia   . AAA (abdominal aortic aneurysm) 04/2012    STENTING OF AAA IN [place]  . Junctional cardiac arrhythmia     Occurred postoperatively  after urologic surgery  . Severe sinus bradycardia     Occurred postoperatively after urologic surgery  . Aortic valve disorders   . Other primary cardiomyopathies   . Dysphagia   . Status post insertion of percutaneous endoscopic gastrostomy (PEG) tube 02/11/14  . Bacteremia due to Pseudomonas   . Infected aortic graft   . DVT (deep venous thrombosis) 04/2013    s/p IVC Filter  . Cancer 2008-2009    prostate TREATED WITH RADIATION   Past Surgical History  Procedure Laterality Date  . Colectomy      FOR DIVERTICULITIS  . Appendectomy    . Hernia repair    . Bladder surgery  2008    FOR BLADDER STONE  . Total knee arthroplasty Left 1990  . Abdominal aortic aneurysm repair  04/2012  . Total knee arthroplasty Right 07/26/2012    Procedure: RIGHT TOTAL KNEE ARTHROPLASTY;  Surgeon: Tobi Bastos, MD;  Location: [facility];  Service: Orthopedics;  Laterality: Right;  . Nephrolithotomy Left 04/26/2013    Procedure: LEFT PERCUTANEOUS NEPHROLITHOTOMY ;  Surgeon: [name], MD;  Location: [facility];  Service: Urology;  Laterality: Left;  . Nephrolithotomy Left 05/03/2013    Procedure: 2ND STAGE LEFT PERCUTANEOUS NEPHROLITHOTOMY ;  Surgeon: [name], MD;  Location: [facility];  Service: Urology;  Laterality: Left;  . Esophagogastroduodenoscopy N/A 02/11/2014  [id number] hiatal hernia. Patchy erythema of the gastric mucosa; otherwise negative EGD. Status post 51 French Microvasive PEG tube placement  . Peg placement N/A 02/11/2014    Procedure: PERCUTANEOUS ENDOSCOPIC GASTROSTOMY (PEG) PLACEMENT;  Surgeon: Daneil Dolin, MD;  Location: [facility];  Service: Endoscopy;  Laterality: N/A;  . Tee without cardioversion N/A 05/02/2014    Procedure: TRANSESOPHAGEAL ECHOCARDIOGRAM (TEE);  Surgeon: Lelon Perla, MD;  Location: [facility] ENDOSCOPY;  Service: Cardiovascular;  Laterality: N/A;  . Joint replacement    . Cataract extraction, bilateral Bilateral   . Vena cava filter placement  2015    Archie Endo  05/14/2013  . Prostate biopsy      hx/notes 04/25/2013  . Hemorrhoid surgery      hx/notes 04/25/2013  . Transurethral resection of prostate      hx/notes 04/25/2013  . Radiology with anesthesia N/A 07/30/2014    Procedure: MRI BRAIN WITH AND WITHOUT,MRI LUMBER WITHOUT CONTRAST,MRI THORACIC;  Surgeon: Medication Radiologist, MD;  Location: [place];  Service: Radiology;  Laterality: N/A;   Family History  Problem Relation Age of Onset  . Lung cancer Father    History  Substance Use Topics  . Smoking status: Former Smoker -- 2.00 packs/day for 37 years    Types: Cigarettes    Start date: 02/17/1949    Quit date: 03/15/1985  . Smokeless tobacco: Former Systems developer  . Alcohol Use: No    Review of Systems  Constitutional: Positive for appetite change, fatigue and unexpected weight change. Negative for fever.  HENT: Negative for trouble swallowing.   Eyes: Negative for discharge and redness.  Respiratory: Negative for cough, choking and shortness of breath.   Cardiovascular: Negative for chest pain.  Gastrointestinal: Positive for nausea. Negative for vomiting.  Endocrine: Negative for polyuria.  Genitourinary: Positive for decreased urine volume.  Musculoskeletal: Positive for back pain.  Skin: Negative for rash and wound.  Neurological: Negative for seizures.  Psychiatric/Behavioral: Positive for behavioral problems, confusion, sleep disturbance and agitation.      Allergies  Atorvastatin and Penicillins  Home Medications   Prior to Admission medications   Medication Sig Start Date End Date Taking? Authorizing Provider  acetaminophen (TYLENOL) 500 MG tablet Take 1,000 mg by mouth every 6 (six) hours as needed for mild pain or moderate pain.    Historical Provider, MD  Amino Acids-Protein Hydrolys (FEEDING SUPPLEMENT, PRO-STAT SUGAR FREE 64,) LIQD Place 30 mLs into feeding tube 3 (three) times daily. 08/01/14   Geradine Girt, DO  bisacodyl (DULCOLAX) 10 MG suppository Place 1  suppository (10 mg total) rectally as needed for mild constipation or moderate constipation. 05/07/14   Silver Huguenin Elgergawy, MD  ceFEPIme 2 g in dextrose 5 % 50 mL Inject 2 g into the vein every 12 (twelve) hours. 07/19/14   Modena Jansky, MD  diphenhydrAMINE (BENADRYL) 12.5 MG/5ML elixir Take 5 mLs (12.5 mg total) by mouth at bedtime as needed for sleep. 08/01/14   Geradine Girt, DO  dronabinol (MARINOL) 2.5 MG capsule Take 1 capsule (2.5 mg total) by mouth 3 (three) times daily. Patient not taking: Reported on 08/28/2014 06/07/14   [name], MD  HYDROcodone-acetaminophen (NORCO/VICODIN) 5-325 MG per tablet Take 1 tablet by mouth every 6 (six) hours as needed for moderate pain. 08/01/14   Geradine Girt, DO  magnesium oxide (MAG-OX) 400 (241.3 MG) MG tablet Take 0.5 tablets (200 mg total) by mouth daily. 04/23/14   Geradine Girt, DO  mirtazapine (REMERON) 7.5  MG tablet Take 7.5 mg by mouth at bedtime.    Historical Provider, MD  Multiple Vitamin (MULTI-VITAMIN PO) Take 1 tablet by mouth daily.    Historical Provider, MD  Nutritional Supplements (FEEDING SUPPLEMENT, JEVITY 1.2 CAL,) LIQD Place 237 mLs into feeding tube 2 (two) times daily with breakfast and lunch. 08/01/14   Geradine Girt, DO  Nutritional Supplements (FEEDING SUPPLEMENT, JEVITY 1.2 CAL,) LIQD Place 1,000 mLs into feeding tube continuous. 08/01/14   Geradine Girt, DO  simvastatin (ZOCOR) 20 MG tablet Take 1 tablet (20 mg total) by mouth daily. Patient taking differently: Take 20 mg by mouth daily at 6 PM.  05/07/14   Albertine Patricia, MD  tolterodine (DETROL LA) 4 MG 24 hr capsule Take 4 mg by mouth 2 (two) times daily.    Historical Provider, MD  vancomycin (VANCOCIN) 50 mg/mL oral solution Take 2.5 mLs (125 mg total) by mouth 4 (four) times daily. 06/03/14   Michel Bickers, MD  Water For Irrigation, Sterile (FREE WATER) SOLN Place 200 mLs into feeding tube 4 (four) times daily. 05/07/14   Albertine Patricia, MD   There were no  vitals taken for this visit. Physical Exam  Constitutional: He appears listless. He is easily aroused. No distress.  Tall thin. Does not appear emaciated. Per family has lost 90 pounds in the last 11 months  HENT:  Tongue appears dry. No scleral icterus. Conjunctiva not pale. No sign of trauma to the head.  Eyes: Conjunctivae are normal. No scleral icterus.  Neck:  Supple neck. No JVD.  Cardiovascular:  Sinus. Good perfusion.  Pulmonary/Chest:  No increased work of breathing. No abnormal breath sounds noted.  Abdominal:  Soft benign. He has been incontinent of a small amount of brown stool. Rectal temp 98 6.  Musculoskeletal:  Moves all 4 extremities.  Neurological: He is easily aroused. He appears listless.  Evening. Awakens to my voice. We will track side to side with his eyes. No obvious cranial nerve deficits. Will move all 4 extremities to command inconsistently.  Skin:  Skin breakdown or ulcerations.    ED Course  Procedures (including critical care time) Labs Review Labs Reviewed  URINE CULTURE  CULTURE, BLOOD (ROUTINE X 2)  CULTURE, BLOOD (ROUTINE X 2)  CLOSTRIDIUM DIFFICILE BY PCR (NOT AT [facility])  CBC WITH DIFFERENTIAL/PLATELET  COMPREHENSIVE METABOLIC PANEL  URINALYSIS, ROUTINE W REFLEX MICROSCOPIC (NOT AT [facility])  VANCOMYCIN, RANDOM  CBG MONITORING, ED  I-STAT CG4 LACTIC ACID, ED  I-STAT TROPOININ, ED    Imaging Review No results found.   EKG Interpretation None      MDM   Final diagnoses:  Delirium  Weakness  Delirium  Weakness    Patient's laboratory and physical evaluation here did not reveal any acute medical processes. He is somnolent. But he awakens to voice. He moves all 4 extremities and is nonfocal.  Imaging studies show no acute processes.  I spoke with the charge nurse that was also his bedside nurse at [facility] care center. She states that he has been more agitated this week. States that he will "seem fine". This can last for several  hours at a time. Then she states "he will go from 0-10 and just a matter of a few minutes". States he becomes agitated and actually "took a swing at" a few of the staff members there. Is concerned about the safety of the patient as well as her staff.  He was started on Remeron 2 weeks ago.  He was given Ativan gel yesterday afternoon at 2:30, then this morning at 2:30. Through his departmental stay he has become more awake and alert. I have not seen any additional episodes of agitation here.  I discussed the case with the hospitalist. We discussed this at length. There is no apparent reason for medical admission at this time.  She'll testing indicated at this time. I discussed the case with the [facility]. They have discussed the case with the psychiatrist on call. Apparently there is no evaluator for telemetry psych until 8:00 tonight. The TTS evaluation was completed, BASIC information gathered, and tele- psych is pending.  I anticipate and hope that psychiatry can have input and medication recommendations. Per my discussion with behavioral health staff there may be consideration for transfer to an alternate facility such as [place] with the patient can continue to get his IV antibiotics, as well as inpatient care will his medications were adjusted to hopefully ensure that controls his agitation, without causing additional somnolence.             Tanna Furry, MD 08/31/14 1459

## 2014-08-31 NOTE — ED Notes (Signed)
Per Benjamine Mola in Pharmacy, Maxipime is on backorder. Change order to 1G Fortaz q 12 hours. Approved by Dr. Alvino Chapel. See new orders.

## 2014-08-31 NOTE — ED Notes (Addendum)
Per EMS- pt coming from [facility] staff reported altered mental status for 14 days, 3 days ago was seen for a fall and d/c'ed - passed 2 days increased AMS and combativeness. Pt has indwelling PICC, has been receiving IV Vancomycin for over 1 year. For EMS pt answering questions appropriately and following commands. Pt normally a/o x4 according to daughter. Daughter at this time very addiment about pt being admitted. Pt is alert and oriented x4 at this time. VSS.

## 2014-08-31 NOTE — Consult Note (Signed)
  S-Pt in ED for hx of altered mental status with periods of aggression.Telepsych request received tonite. O- Dr Parke Poisson had assessed the patient via TTS and recommended Geripsych hospitalization for proper management of medications and [facility] is actively seeking placement for [name] with this specialty hospital. A-Per Dr Parke Poisson pt requires Geripsychiatric admission P-Discussed above with Dr Barbette Merino. No further evaluation at this time.May request further MD eval in AM if desired.

## 2014-08-31 NOTE — ED Notes (Signed)
Three daughters left for the night -  Family planning to avoid placement if possible, wanting pt to go back to [place] for rehab. Family convinced that pt's increased altered mental status is d/t Remeron. Family wanting to be notified before any change is made to plan of care.   Per Marijean Bravo at [facility], no reassessment is to take place at this time, and placement is being actively sought for patient.  FAMILY TO BE NOTIFIED BEFORE PT IS MOVED TO ANY FACILITY. FAMILY TO BE NOTIFIED BEFORE ANY ASSESSMENT IS PERFORMED BY PSYCH.

## 2014-08-31 NOTE — ED Notes (Signed)
Pt returned from Radiology.

## 2014-08-31 NOTE — BH Assessment (Addendum)
Tele Assessment Note   [name] is an 81 y.o. male who came from [place] facility complaining of altered mental status and combativeness towards staff for the past 3 days after a fall. Pt has been reported to be pulling out his G-tube, trying to get out of bed, and agitated, striking even staff that he likes.  According to daughter, pt is normally alert and oriented x4 and was oriented Wed night when he came to the ED.  She is concerned about the mediations he is on and what affect they might be having on him. Pt says he has been married 72 plus years (daughter corrects it to 27) and he states he is retired from the state of [place] and used to work on a Astronomer.  During assessment, pt was calm and cooperative, dressed in a hospital gown and lying in a hospital bed, accompanied by his daughter.  He made good eye contact, but had trouble hearing, and his daughter had to repeat questions for him. He had slightly anxious, but appropriate affect and congruent mood. He denies depression, SI, HI, AVH. Daughter states that she has not seen him hallucinating, but that he did "wake up laughing" from a dream and could not stop laughing and telling her about it over and over.  She thinks he is somewhat depressed due to his recent physical changes, but pt has no other previous mental health history. She says that she has seen him praying for God to "take him" since he has had his medical problems last fall, and also says that he has not eaten since October when had some medical problems at that time and they put in the G tube. Pt denies abuse and says he likes living at [facility].  He states he does not know why he is in the hospital.  Pt's daughter states that in may, prior to a fall, pt was walking independently with a walker and driving his car around.  Dr. Parke Poisson recommends IP gero treatment. TTS will refer pt out to geropsych facilites.   Axis I: possible Dementia Axis II: Deferred Axis III:   Past Medical History  Diagnosis Date  . Hypertension   . BPH (benign prostatic hyperplasia)   . Hyperlipidemia   . AAA (abdominal aortic aneurysm) 04/2012    STENTING OF AAA IN [place]  . Junctional cardiac arrhythmia     Occurred postoperatively after urologic surgery  . Severe sinus bradycardia     Occurred postoperatively after urologic surgery  . Aortic valve disorders   . Other primary cardiomyopathies   . Dysphagia   . Status post insertion of percutaneous endoscopic gastrostomy (PEG) tube 02/11/14  . Bacteremia due to Pseudomonas   . Infected aortic graft   . DVT (deep venous thrombosis) 04/2013    s/p IVC Filter  . Cancer 2008-2009    prostate TREATED WITH RADIATION   Axis IV: recent physical changes Axis V: 21-30 behavior considerably influenced by delusions or hallucinations OR serious impairment in judgment, communication OR inability to function in almost all areas  Past Medical History:  Past Medical History  Diagnosis Date  . Hypertension   . BPH (benign prostatic hyperplasia)   . Hyperlipidemia   . AAA (abdominal aortic aneurysm) 04/2012    STENTING OF AAA IN [place]  . Junctional cardiac arrhythmia     Occurred postoperatively after urologic surgery  . Severe sinus bradycardia     Occurred postoperatively after urologic surgery  .  Aortic valve disorders   . Other primary cardiomyopathies   . Dysphagia   . Status post insertion of percutaneous endoscopic gastrostomy (PEG) tube 02/11/14  . Bacteremia due to Pseudomonas   . Infected aortic graft   . DVT (deep venous thrombosis) 04/2013    s/p IVC Filter  . Cancer 2008-2009    prostate TREATED WITH RADIATION    Past Surgical History  Procedure Laterality Date  . Colectomy      FOR DIVERTICULITIS  . Appendectomy    . Hernia repair    . Bladder surgery  2008    FOR BLADDER STONE  . Total knee arthroplasty Left 1990  . Abdominal aortic aneurysm repair  04/2012  . Total knee arthroplasty Right  07/26/2012    Procedure: RIGHT TOTAL KNEE ARTHROPLASTY;  Surgeon: Tobi Bastos, MD;  Location: [facility];  Service: Orthopedics;  Laterality: Right;  . Nephrolithotomy Left 04/26/2013    Procedure: LEFT PERCUTANEOUS NEPHROLITHOTOMY ;  Surgeon: [name], MD;  Location: [facility];  Service: Urology;  Laterality: Left;  . Nephrolithotomy Left 05/03/2013    Procedure: 2ND STAGE LEFT PERCUTANEOUS NEPHROLITHOTOMY ;  Surgeon: [name], MD;  Location: [facility];  Service: Urology;  Laterality: Left;  . Esophagogastroduodenoscopy N/A 02/11/2014    KGM:WNUUV hiatal hernia. Patchy erythema of the gastric mucosa; otherwise negative EGD. Status post 20 French Microvasive PEG tube placement  . Peg placement N/A 02/11/2014    Procedure: PERCUTANEOUS ENDOSCOPIC GASTROSTOMY (PEG) PLACEMENT;  Surgeon: Daneil Dolin, MD;  Location: [facility];  Service: Endoscopy;  Laterality: N/A;  . Tee without cardioversion N/A 05/02/2014    Procedure: TRANSESOPHAGEAL ECHOCARDIOGRAM (TEE);  Surgeon: Lelon Perla, MD;  Location: [facility] ENDOSCOPY;  Service: Cardiovascular;  Laterality: N/A;  . Joint replacement    . Cataract extraction, bilateral Bilateral   . Vena cava filter placement  2015    Archie Endo 05/14/2013  . Prostate biopsy      hx/notes 04/25/2013  . Hemorrhoid surgery      hx/notes 04/25/2013  . Transurethral resection of prostate      hx/notes 04/25/2013  . Radiology with anesthesia N/A 07/30/2014    Procedure: MRI BRAIN WITH AND WITHOUT,MRI LUMBER WITHOUT CONTRAST,MRI THORACIC;  Surgeon: Medication Radiologist, MD;  Location: [facility];  Service: Radiology;  Laterality: N/A;    Family History:  Family History  Problem Relation Age of Onset  . Lung cancer Father     Social History:  reports that he quit smoking about 29 years ago. His smoking use included Cigarettes. He started smoking about 65 years ago. He has a 74 pack-year smoking history. He has quit using smokeless tobacco. He reports that he does not drink  alcohol or use illicit drugs.  Additional Social History:  Alcohol / Drug Use Pain Medications: denies Prescriptions: denies Over the Counter: denies History of alcohol / drug use?:  (denies) Negative Consequences of Use:  (denies) Withdrawal Symptoms:  (denies)  CIWA: CIWA-Ar BP: 141/85 mmHg Pulse Rate: 78 COWS:    PATIENT STRENGTHS: (choose at least two) Average or above average intelligence Communication skills Supportive family/friends Work skills  Allergies:  Allergies  Allergen Reactions  . Atorvastatin Other (See Comments)    Causes constipation  . Penicillins Other (See Comments)    Other reaction(s): RASH HIVES  Tolerates cefepime 2/22    Home Medications:  (Not in a hospital admission)  OB/GYN Status:  No LMP for male patient.  General Assessment Data Location of Assessment: [facility] ED  TTS Assessment: In system Is this a Tele or Face-to-Face Assessment?: Tele Assessment Is this an Initial Assessment or a Re-assessment for this encounter?: Initial Assessment Marital status: Married Pregnancy Status: No Living Arrangements: Spouse/significant other Biomedical engineer) Can pt return to current living arrangement?: Yes (after stabilization) Admission Status: Voluntary Is patient capable of signing voluntary admission?: Yes Referral Source: Self/Family/Friend Insurance type: [place] Living Arrangements: Spouse/significant other Biomedical engineer) Name of Psychiatrist: none Name of Therapist: none  Education Status Is patient currently in school?: No  Risk to self with the past 6 months Suicidal Ideation: No Has patient been a risk to self within the past 6 months prior to admission? : No Suicidal Intent: No Has patient had any suicidal intent within the past 6 months prior to admission? : No Is patient at risk for suicide?: No Suicidal Plan?: No Has patient had any suicidal plan within the past 6 months prior to admission? : No Access to Means:  No What has been your use of drugs/alcohol within the last 12 months?:  (none) Previous Attempts/Gestures: No Intentional Self Injurious Behavior:  (pulling out feeding tube) Family Suicide History: No Recent stressful life event(s): Recent negative physical changes Persecutory voices/beliefs?: No Depression: Yes Depression Symptoms: Isolating, Feeling angry/irritable Substance abuse history and/or treatment for substance abuse?: No Suicide prevention information given to non-admitted patients: Not applicable  Risk to Others within the past 6 months Homicidal Ideation: No Does patient have any lifetime risk of violence toward others beyond the six months prior to admission? : No Thoughts of Harm to Others: No Current Homicidal Intent: No Current Homicidal Plan: No Access to Homicidal Means: No History of harm to others?:  (has been aggressive towards staff at [facility] since Tuesday) Assessment of Violence:  (aggitated since Tuesday off and on) Violent Behavior Description:  (striking at staff, trying to get out of bed) Does patient have access to weapons?: No Criminal Charges Pending?: No Does patient have a court date: No Is patient on probation?: No  Psychosis Hallucinations:  (laughing at a dream) Delusions: None noted  Mental Status Report Appearance/Hygiene: Disheveled, In scrubs Eye Contact: Good Motor Activity: Restlessness Speech: Slurred Level of Consciousness: Quiet/awake Mood:  (appropriate) Affect: Appropriate to circumstance Anxiety Level: Moderate Thought Processes: Coherent, Relevant Judgement: Impaired Orientation: Place, Person, Situation (June 9th) Obsessive Compulsive Thoughts/Behaviors: None  Cognitive Functioning Concentration: Fair Memory: Remote Intact, Recent Impaired IQ: Average Insight: Poor Impulse Control: Poor Appetite: Poor Weight Loss: 20 Sleep: Increased Total Hours of Sleep: 14 (in past few days) Vegetative Symptoms: Decreased  grooming  ADLScreening [facility] Assessment Services) Patient's cognitive ability adequate to safely complete daily activities?: No Patient able to express need for assistance with ADLs?: Yes Independently performs ADLs?: No  Prior Inpatient Therapy Prior Inpatient Therapy: No  Prior Outpatient Therapy Prior Outpatient Therapy: No  ADL Screening (condition at time of admission) Patient's cognitive ability adequate to safely complete daily activities?: No Is the patient deaf or have difficulty hearing?: Yes Does the patient have difficulty seeing, even when wearing glasses/contacts?: Yes Does the patient have difficulty concentrating, remembering, or making decisions?: Yes Patient able to express need for assistance with ADLs?: Yes Does the patient have difficulty dressing or bathing?: Yes Independently performs ADLs?: No Communication: Independent Dressing (OT): Needs assistance Is this a change from baseline?: Change from baseline, expected to last >3 days Grooming: Needs assistance Is this a change from baseline?: Change from baseline, expected to last >3 days Feeding: Needs  assistance Is this a change from baseline?: Change from baseline, expected to last >3 days Bathing: Needs assistance Is this a change from baseline?: Change from baseline, expected to last >3 days Toileting: Needs assistance Is this a change from baseline?: Change from baseline, expected to last >3days In/Out Bed: Needs assistance Is this a change from baseline?: Change from baseline, expected to last >3 days Walks in Home: Needs assistance Is this a change from baseline?: Change from baseline, expected to last >3 days Does the patient have difficulty walking or climbing stairs?: Yes Weakness of Legs: Both Weakness of Arms/Hands: Both  Home Assistive Devices/Equipment Home Assistive Devices/Equipment: Grab bars around toilet, Wheelchair, Environmental consultant (specify type)    Abuse/Neglect Assessment (Assessment to be  complete while patient is alone) Physical Abuse: Denies Verbal Abuse: Denies Sexual Abuse: Denies Exploitation of patient/patient's resources: Denies Self-Neglect: Denies Values / Beliefs Cultural Requests During Hospitalization: None Spiritual Requests During Hospitalization: None   Advance Directives (For Healthcare) Does patient have an advance directive?: Yes Type of Advance Directive: Healthcare Power of Attorney    Additional Information 1:1 In Past 12 Months?: No CIRT Risk: Yes Elopement Risk: Yes Does patient have medical clearance?: Yes     Disposition:  Disposition Initial Assessment Completed for this Encounter: Yes Disposition of Patient: Inpatient treatment program Type of inpatient treatment program: Adult  [facility] 08/31/2014 1:56 PM

## 2014-08-31 NOTE — ED Notes (Signed)
TTS in progress 

## 2014-08-31 NOTE — ED Notes (Signed)
Pt becoming irritated, tearing off his blood pressure cuff and 02 reader, pt was switched to a bed and repositioned for comfort.  Family at bedside at this time.

## 2014-08-31 NOTE — Progress Notes (Signed)
Disposition CSW completed referrals on patient to the following Gero-Psych facilities:  [name]  CSW will continue to seek placement for this patient.  [facility] Disposition CSW [phone]

## 2014-08-31 NOTE — ED Notes (Signed)
Family asking if pt needs PICC line flushed with heparin like they do at home. Per Melissa with IV team, pt needs PICC line flushed q12 hours with saline as long as patient is in hospital and PICC is being actively used. When pt discharges, per facility policy, PICC can be flushed with heparin.

## 2014-09-01 ENCOUNTER — Emergency Department (HOSPITAL_COMMUNITY): Payer: Medicare Other

## 2014-09-01 DIAGNOSIS — R7881 Bacteremia: Secondary | ICD-10-CM | POA: Insufficient documentation

## 2014-09-01 DIAGNOSIS — Z87891 Personal history of nicotine dependence: Secondary | ICD-10-CM

## 2014-09-01 DIAGNOSIS — Z7189 Other specified counseling: Secondary | ICD-10-CM | POA: Insufficient documentation

## 2014-09-01 DIAGNOSIS — B965 Pseudomonas (aeruginosa) (mallei) (pseudomallei) as the cause of diseases classified elsewhere: Secondary | ICD-10-CM

## 2014-09-01 DIAGNOSIS — F0391 Unspecified dementia with behavioral disturbance: Secondary | ICD-10-CM

## 2014-09-01 DIAGNOSIS — R531 Weakness: Secondary | ICD-10-CM | POA: Diagnosis not present

## 2014-09-01 DIAGNOSIS — I716 Thoracoabdominal aortic aneurysm, without rupture: Secondary | ICD-10-CM | POA: Diagnosis not present

## 2014-09-01 DIAGNOSIS — I712 Thoracic aortic aneurysm, without rupture, unspecified: Secondary | ICD-10-CM | POA: Insufficient documentation

## 2014-09-01 DIAGNOSIS — A498 Other bacterial infections of unspecified site: Secondary | ICD-10-CM | POA: Insufficient documentation

## 2014-09-01 DIAGNOSIS — R410 Disorientation, unspecified: Secondary | ICD-10-CM | POA: Insufficient documentation

## 2014-09-01 DIAGNOSIS — R400 Somnolence: Secondary | ICD-10-CM | POA: Insufficient documentation

## 2014-09-01 DIAGNOSIS — F0390 Unspecified dementia without behavioral disturbance: Secondary | ICD-10-CM | POA: Insufficient documentation

## 2014-09-01 DIAGNOSIS — Z515 Encounter for palliative care: Secondary | ICD-10-CM | POA: Insufficient documentation

## 2014-09-01 MED ORDER — HALOPERIDOL LACTATE 5 MG/ML IJ SOLN
5.0000 mg | Freq: Once | INTRAMUSCULAR | Status: AC
  Administered 2014-09-01: 5 mg via INTRAMUSCULAR
  Filled 2014-09-01: qty 1

## 2014-09-01 MED ORDER — HALOPERIDOL LACTATE 5 MG/ML IJ SOLN
2.0000 mg | INTRAMUSCULAR | Status: DC | PRN
  Administered 2014-09-02: 2 mg via INTRAVENOUS
  Filled 2014-09-01: qty 1

## 2014-09-01 MED ORDER — HYDROCODONE-ACETAMINOPHEN 5-325 MG PO TABS
1.0000 | ORAL_TABLET | Freq: Four times a day (QID) | ORAL | Status: DC | PRN
  Administered 2014-09-01: 1 via ORAL
  Filled 2014-09-01 (×2): qty 1

## 2014-09-01 MED ORDER — ONDANSETRON HCL 4 MG/2ML IJ SOLN
4.0000 mg | Freq: Once | INTRAMUSCULAR | Status: AC
  Administered 2014-09-01: 4 mg via INTRAVENOUS
  Filled 2014-09-01: qty 2

## 2014-09-01 MED ORDER — CITALOPRAM HYDROBROMIDE 10 MG PO TABS: 10.0000 mg | ORAL_TABLET | Freq: Every day | ORAL | Status: DC

## 2014-09-01 MED ORDER — MORPHINE SULFATE 4 MG/ML IJ SOLN
4.0000 mg | Freq: Once | INTRAMUSCULAR | Status: AC
  Administered 2014-09-01: 4 mg via INTRAVENOUS
  Filled 2014-09-01: qty 1

## 2014-09-01 MED ORDER — ONDANSETRON HCL 4 MG/2ML IJ SOLN
4.0000 mg | Freq: Four times a day (QID) | INTRAMUSCULAR | Status: DC | PRN
Start: 2014-09-01 — End: 2014-09-02

## 2014-09-01 MED ORDER — MORPHINE SULFATE 2 MG/ML IJ SOLN
2.0000 mg | INTRAMUSCULAR | Status: DC | PRN
  Administered 2014-09-01: 2 mg via INTRAVENOUS
  Filled 2014-09-01: qty 1

## 2014-09-01 NOTE — ED Notes (Signed)
RN outside pt's room attempting to allow pt to decrease stimulation, attempting to allow pt to relax. Talking to "doctor over there with a cigarette, he's not supposed to do that." Pt heard crying for [name], began crying. Checked on pt in attempt to reassure, pt pulling violently on PEG tube and PICC Line. Security and MD called, PEG tube and PICC secured.

## 2014-09-01 NOTE — Consult Note (Signed)
Consultation Note Date: 09/01/2014   Patient Name: [name]  DOB: 04/30/1929  MRN: [id number]  Age / Sex: 81 y.o., male   PCP: [name], MD Referring Physician: Tanna Furry, MD  Reason for Consultation: Establishing goals of care and addressing safe disposition options.   Palliative Care Assessment and Plan Summary of Established Goals of Care and Medical Treatment Preferences   Clinical Assessment/Narrative:   81 yo gentleman, well known to the palliative service, extensive PMH, most significant for thoraco-aortic aneurysm s/p graft repair in July 6629 and complicated by recurrent pseudomonal graft infection, DVT, C-diff, recurrent admissions.   Patient was on chronic IV antibiotics at home as well as chronic PO Vanc for C diff. Patient brought into ED by daughter in am on 08-31-14 for agitation. Oral intake has declined. Still has PEG Jevity infusions. Another acute change since the past 2 weeks was increased sleeping during the day.   Workup in the ED did not reveal any acute processes. After ED provider's discussions with hospitalist team, tele psych consult was placed, consideration was being given for the patient to be transferred to a geri psych facility.   It was deemed appropriate to request another palliative care consult, with the patient's daughter [name] stating that she was now agreeable to hospice consult.   The patient is seen and examined, elderly gentleman, resting in bed, opens eyes when name is called, does not engage or verbalize. Call placed and discussed with [name] in detail. Previous palliative providers Wadie Lessen NP and Dr Angelia Mould) notes reviewed. [name] is tearful, she states that she recognizes that the patient needs comfort based approach to his care now. Discussed hospice consult, D/c PICC line,D/C PEG, stop tube feeds, D/c Antibiotics and full scope of comfort measures approach. Discussed possibility of pursuing inpatient hospice.   [name] would  like to meet with palliative provider on 09-02-14 at 0800 to have the above mentioned discussions again. She states it is too soon to transfer the patient to inpatient hospice, she is tearful, wants to give her siblings a chance to come see the patient. Discussed with case manager in the ED Mariann Laster who will reach out to Avis again shortly.   Will add PRN IV Haldol and PRN IV Morphine for symptom management for now. Agree that comfort measures only and inpatient hospice facility transfer appear to be the most prudent options at this time for this gentleman.   Contacts/Participants in Discussion: Primary Decision Maker: daughter [name]: yes primary spokesperson is daughter [name]    Code Status/Advance Care Planning:   As per patient's previously expressed wishes, patient wished to have CPR but no intubation. [name] wishes to keep these wishes in place, until family meeting in am before a formal DNR order request can be made in the chart.   Symptom Management:   Agitation: PRN Haldol  Pain: PRN IV Morphine  Palliative Prophylaxis: yes  Additional Recommendations (Limitations, Scope, Preferences):  Continue to support family in making appropriate code status and disposition decisions.  Psycho-social/Spiritual:   Support System: 3 daughters and a son, has a wife.   Desire for further Chaplaincy support:no  Prognosis:  possibly days to some very limited number of weeks at this point  Discharge Planning:   likely inpatient hospice, pending further discussions with family.    Domains of Care: - Physical: no acute distress currently - Psychological: agitated at times - Social: came from [facility], has a wife, 3 daughters and son - Spiritual: no  acute spiritual issues - Cultural: as above - Imminently dying: no but prognosis guarded given acute on chronic co morbidities - Ethical/Legal: patient had previously expressed wishes to have CPR done, but did not want  intubation or mechanical ventilation. Discussed again extensively with daughter [name] over the phone, she wishes to not change code status to full DNR until family meeting in am.   Values: shifting towards comfort as main goal Life limiting illness:  thoraco-aortic aneurysm s/p graft repair in July 3299 and complicated by recurrent pseudomonal graft infection, DVT, C-diff.       Chief Complaint/History of Present Illness: agitation.  Primary Diagnoses  Present on Admission:  **None** thoraco-aortic aneurysm s/p graft repair in July 2426 and complicated by recurrent pseudomonal graft infection, DVT, C-diff admitted with recurrent fever   Palliative Review of Systems: noted I have reviewed the medical record, interviewed the patient and family, and examined the patient. The following aspects are pertinent.  Past Medical History  Diagnosis Date  . Hypertension   . BPH (benign prostatic hyperplasia)   . Hyperlipidemia   . AAA (abdominal aortic aneurysm) 04/2012    STENTING OF AAA IN [place]  . Junctional cardiac arrhythmia     Occurred postoperatively after urologic surgery  . Severe sinus bradycardia     Occurred postoperatively after urologic surgery  . Aortic valve disorders   . Other primary cardiomyopathies   . Dysphagia   . Status post insertion of percutaneous endoscopic gastrostomy (PEG) tube 02/11/14  . Bacteremia due to Pseudomonas   . Infected aortic graft   . DVT (deep venous thrombosis) 04/2013    s/p IVC Filter  . Cancer 2008-2009    prostate TREATED WITH RADIATION   History   Social History  . Marital Status: Married    Spouse Name: N/A  . Number of Children: N/A  . Years of Education: N/A   Social History Main Topics  . Smoking status: Former Smoker -- 2.00 packs/day for 37 years    Types: Cigarettes    Start date: 02/17/1949    Quit date: 03/15/1985  . Smokeless tobacco: Former Systems developer  . Alcohol Use: No  . Drug Use: No  . Sexual Activity: No    Other Topics Concern  . None   Social History Narrative   Family History  Problem Relation Age of Onset  . Lung cancer Father    Scheduled Meds: . feeding supplement (JEVITY 1.2 CAL)  237 mL Per Tube BID WC  . free water  200 mL Per Tube QID  . vancomycin  125 mg Oral QID   Continuous Infusions: . cefTAZidime (FORTAZ)  IV Stopped (09/01/14 0410)  . feeding supplement (JEVITY 1.2 CAL) Stopped (09/01/14 0615)   PRN Meds:.acetaminophen, haloperidol lactate, HYDROcodone-acetaminophen, morphine injection, ondansetron (ZOFRAN) IV, risperiDONE Medications Prior to Admission:  Prior to Admission medications   Medication Sig Start Date End Date Taking? Authorizing Provider  acetaminophen (TYLENOL) 500 MG tablet Take 1,000 mg by mouth every 6 (six) hours as needed for mild pain or moderate pain.   Yes Historical Provider, MD  Amino Acids-Protein Hydrolys (FEEDING SUPPLEMENT, PRO-STAT SUGAR FREE 64,) LIQD Place 30 mLs into feeding tube 3 (three) times daily. 08/01/14  Yes Jessica U Vann, DO  ceFEPIme 2 g in dextrose 5 % 50 mL Inject 2 g into the vein every 12 (twelve) hours. 07/19/14  Yes Modena Jansky, MD  diphenhydrAMINE (BENADRYL) 12.5 MG/5ML elixir Take 5 mLs (12.5 mg total) by mouth at bedtime  as needed for sleep. 08/01/14  Yes Geradine Girt, DO  HYDROcodone-acetaminophen (NORCO/VICODIN) 5-325 MG per tablet Take 1 tablet by mouth every 6 (six) hours as needed for moderate pain. 08/01/14  Yes Geradine Girt, DO  LORazepam in dextrose solution Take 1 mg by mouth every 4 (four) hours as needed (Sedation).   Yes Historical Provider, MD  magnesium oxide (MAG-OX) 400 (241.3 MG) MG tablet Take 0.5 tablets (200 mg total) by mouth daily. 04/23/14  Yes Geradine Girt, DO  mirtazapine (REMERON) 7.5 MG tablet Take 7.5 mg by mouth at bedtime.   Yes Historical Provider, MD  Multiple Vitamin (MULTI-VITAMIN PO) Take 1 tablet by mouth daily.   Yes Historical Provider, MD  Nutritional Supplements (FEEDING  SUPPLEMENT, JEVITY 1.2 CAL,) LIQD Place 237 mLs into feeding tube 2 (two) times daily with breakfast and lunch. 08/01/14  Yes Geradine Girt, DO  Nutritional Supplements (FEEDING SUPPLEMENT, JEVITY 1.2 CAL,) LIQD Place 1,000 mLs into feeding tube continuous. 08/01/14  Yes Geradine Girt, DO  simvastatin (ZOCOR) 20 MG tablet Take 1 tablet (20 mg total) by mouth daily. Patient taking differently: Take 20 mg by mouth daily at 6 PM.  05/07/14  Yes Albertine Patricia, MD  tolterodine (DETROL LA) 4 MG 24 hr capsule Take 4 mg by mouth 2 (two) times daily.   Yes Historical Provider, MD  vancomycin (VANCOCIN) 50 mg/mL oral solution Take 2.5 mLs (125 mg total) by mouth 4 (four) times daily. 06/03/14  Yes Michel Bickers, MD  Water For Irrigation, Sterile (FREE WATER) SOLN Place 200 mLs into feeding tube 4 (four) times daily. 05/07/14  Yes Albertine Patricia, MD  bisacodyl (DULCOLAX) 10 MG suppository Place 1 suppository (10 mg total) rectally as needed for mild constipation or moderate constipation. 05/07/14   Silver Huguenin Elgergawy, MD  dronabinol (MARINOL) 2.5 MG capsule Take 1 capsule (2.5 mg total) by mouth 3 (three) times daily. Patient not taking: Reported on 08/28/2014 06/07/14   [name], MD   Allergies  Allergen Reactions  . Atorvastatin Other (See Comments)    Causes constipation  . Penicillins Other (See Comments)    Other reaction(s): RASH HIVES  Tolerates cefepime 2/22   CBC:    Component Value Date/Time   WBC 4.5 08/31/2014 0836   WBC 5.8 12/26/2013 1105   WBC 5.4 02/28/2007 0919   HGB 11.8* 08/31/2014 0836   HGB 11.0* 12/26/2013 1105   HGB 15.0 02/28/2007 0919   HCT 36.6* 08/31/2014 0836   HCT 34.5* 12/26/2013 1105   HCT 44.0 02/28/2007 0919   PLT 96* 08/31/2014 0836   PLT 157 12/26/2013 1105   PLT 153 02/28/2007 0919   MCV 87.6 08/31/2014 0836   MCV 84 12/26/2013 1105   MCV 84.4 02/28/2007 0919   NEUTROABS 3.0 08/31/2014 0836   NEUTROABS 4.2 12/26/2013 1105   NEUTROABS 3.0  02/28/2007 0919   LYMPHSABS 0.9 08/31/2014 0836   LYMPHSABS 0.9 12/26/2013 1105   LYMPHSABS 1.6 02/28/2007 0919   MONOABS 0.5 08/31/2014 0836   MONOABS 0.6 02/28/2007 0919   EOSABS 0.0 08/31/2014 0836   EOSABS 0.1 12/26/2013 1105   EOSABS 0.3 02/28/2007 0919   BASOSABS 0.0 08/31/2014 0836   BASOSABS 0.0 12/26/2013 1105   BASOSABS 0.0 02/28/2007 0919   Comprehensive Metabolic Panel:    Component Value Date/Time   NA 141 08/31/2014 0836   NA 140 10/31/2013 0839   K 3.6 08/31/2014 0836   K 3.9 10/31/2013 0839  CL 108 08/31/2014 0836   CL 103 10/31/2013 0839   CO2 25 08/31/2014 0836   CO2 27 10/31/2013 0839   BUN 43* 08/31/2014 0836   BUN 17 10/31/2013 0839   CREATININE 1.43* 08/31/2014 0836   CREATININE 1.1 10/31/2013 0839   GLUCOSE 98 08/31/2014 0836   GLUCOSE 93 10/31/2013 0839   CALCIUM 10.1 08/31/2014 0836   CALCIUM 8.9 10/31/2013 0839   AST 31 08/31/2014 0836   ALT 24 08/31/2014 0836   ALKPHOS 85 08/31/2014 0836   BILITOT 0.6 08/31/2014 0836   PROT 7.1 08/31/2014 0836   ALBUMIN 2.4* 08/31/2014 0836    Physical Exam: Vital Signs: BP 142/80 mmHg  Pulse 60  Temp(Src) 98.1 F (36.7 C) (Oral)  Resp 16  SpO2 95% SpO2: SpO2: 95 % O2 Device: O2 Device: Not Delivered O2 Flow Rate:   Intake/output summary:  Intake/Output Summary (Last 24 hours) at 09/01/14 1258 Last data filed at 09/01/14 0410  Gross per 24 hour  Intake    500 ml  Output    200 ml  Net    300 ml   LBM:   Baseline Weight:   Most recent weight:     Exam Findings:   weak appearing elderly gentleman resting in bed does not converse or engage Chest clear S1 S2 Abdomen PEG+ abdominal dressing in place to prevent patient from pulling PEG No edema Opens eyes but does not talk, in no distress currently          Palliative Performance Scale: *20%             Additional Data Reviewed: Recent Labs     08/31/14  0836  WBC  4.5  HGB  11.8*  PLT  96*  NA  141  BUN  43*  CREATININE  1.43*      Time In: 1200 Time Out: 1255 Time Total: 55 min Greater than 50%  of this time was spent counseling and coordinating care related to the above assessment and plan.  Signed by: Loistine Chance, MD  Loistine Chance, MD  09/01/2014, 12:58 PM  Please contact Palliative Medicine Team phone at [phone] for questions and concerns.

## 2014-09-01 NOTE — Care Management (Signed)
Family meeting has taken place. Patient has been made DNR and  placed on comfort care, pending [facility] hosp[facility]. ED CSW and CM will follow up in the am.

## 2014-09-01 NOTE — ED Notes (Signed)
md at bedside

## 2014-09-01 NOTE — ED Notes (Signed)
Unable to get 0600 VS d/t patient's agitation.
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
## 2014-09-01 NOTE — Consult Note (Signed)
[facility] for Infectious Disease    Date of Admission:  08/31/2014  Date of Consult:  09/01/2014  Reason for Consult: Confusion Referring Physician: Dr. Jeneen Rinks   HPI: [name] is an 81 y.o. 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 He was started on cefepime at that time and was discharged to skilled nursing facility with plan for ongoing IV antibiotics. He has been followed by [facility] infectious disease. His PICC line was removed 12-9 and his antibiotics were changed to by mouth Levaquin by [facility].   He is sent to [facility] on 12-10 with a temp of 103.3 and hypotension In the emergency room he was started on vancomycin and cefepime. His CXR was no acute, his WBC is slightly low at 3.4.   His CT of his graft showed: Stable sizes of thoracoabdominal, distal aortic and RIGHT common iliac artery aneurysms post endoluminal stenting. No evidence of endoleak or adjacent abscess collection. Interval additional stenting of the celiac artery with previously identified pseudoaneurysm no longer opacified and smaller in size. Dissection seen at the common hepatic artery origin on the previous study is not definitely visualized on the current study which demonstrates an interval stent. Suspected blood or debris within urinary bladder, cannot exclude minimal anterior bladder wall thickening. Colonic diverticulosis. Patchy bibasilar atelectasis with mild LEFT upper lobe infiltrate suspicious for pneumonia. Small amount of fluid adjacent to the gallbladder, cannot exclude cholecystitis; recommend clinical correlation and if indicated ultrasound imaging.   His BCx grew proteus (R- bactrim, cipro, gent. I - tobra). He was treated with merrem/rifampin and then was supposed to f/u at [facility] for his anbx for his anbx for his graft. He  and his family instead chose to f/u at [facility]. By [facility] from SNF when seen by Dr. Johnnye Sima on 04/09/14 He was on levaquin/rifampin. By his d/c notes he completed his merrem on 03-07-14. He had C diff+ 02-26-14 and had been on vanco 166m qid  He has had several episodes of fever, tachycardia and hypotension with admissions and decision after initial attempts at further suppressive oral fluoroquinolone and CDI recurrence decision made to place him long term Cefepime via PICC line and with problems with recurrent CDI on oral vancomycin. His daughter Elijah Rowancalled me early Saturday am concerned about pts' increasing confusion which she thought might be due to abx. I agreed with her idea to bring to ED and with subsequent discussions with ED MD, recommended admission to facilitate palliative care consult and so I could see pt. It appears pt has been on multiple sedating meds and initial thoughts in ED [facility] Patient has had waxing waning confusion attempting to pull out PICC line and his feeding tube.  I had extensive discussion today with the patients two daughters (one in person) and the other on speaker phone and they both were already coming to the conclusion that the patient was coming to the end of his life and that perhaps all of the interventions that were being made were not improving his quality of life but instead serving to irritate the patient.   Past Medical History  Diagnosis Date  . Hypertension   . BPH (benign prostatic hyperplasia)   . Hyperlipidemia   . AAA (abdominal aortic aneurysm) 04/2012    STENTING OF AAA IN [place]  .  Junctional cardiac arrhythmia     Occurred postoperatively after urologic surgery  . Severe sinus bradycardia     Occurred postoperatively after urologic surgery  . Aortic valve disorders   . Other primary cardiomyopathies   . Dysphagia   . Status post insertion of percutaneous endoscopic gastrostomy (PEG) tube 02/11/14  .  Bacteremia due to Pseudomonas   . Infected aortic graft   . DVT (deep venous thrombosis) 04/2013    s/p IVC Filter  . Cancer 2008-2009    prostate TREATED WITH RADIATION    Past Surgical History  Procedure Laterality Date  . Colectomy      FOR DIVERTICULITIS  . Appendectomy    . Hernia repair    . Bladder surgery  2008    FOR BLADDER STONE  . Total knee arthroplasty Left 1990  . Abdominal aortic aneurysm repair  04/2012  . Total knee arthroplasty Right 07/26/2012    Procedure: RIGHT TOTAL KNEE ARTHROPLASTY;  Surgeon: Tobi Bastos, MD;  Location: [facility];  Service: Orthopedics;  Laterality: Right;  . Nephrolithotomy Left 04/26/2013    Procedure: LEFT PERCUTANEOUS NEPHROLITHOTOMY ;  Surgeon: [name], MD;  Location: [facility];  Service: Urology;  Laterality: Left;  . Nephrolithotomy Left 05/03/2013    Procedure: 2ND STAGE LEFT PERCUTANEOUS NEPHROLITHOTOMY ;  Surgeon: [name], MD;  Location: [facility];  Service: Urology;  Laterality: Left;  . Esophagogastroduodenoscopy N/A 02/11/2014    BZM:CEYEM hiatal hernia. Patchy erythema of the gastric mucosa; otherwise negative EGD. Status post 22 French Microvasive PEG tube placement  . Peg placement N/A 02/11/2014    Procedure: PERCUTANEOUS ENDOSCOPIC GASTROSTOMY (PEG) PLACEMENT;  Surgeon: Daneil Dolin, MD;  Location: [facility];  Service: Endoscopy;  Laterality: N/A;  . Tee without cardioversion N/A 05/02/2014    Procedure: TRANSESOPHAGEAL ECHOCARDIOGRAM (TEE);  Surgeon: Lelon Perla, MD;  Location: [facility] ENDOSCOPY;  Service: Cardiovascular;  Laterality: N/A;  . Joint replacement    . Cataract extraction, bilateral Bilateral   . Vena cava filter placement  2015    Archie Endo 05/14/2013  . Prostate biopsy      hx/notes 04/25/2013  . Hemorrhoid surgery      hx/notes 04/25/2013  . Transurethral resection of prostate      hx/notes 04/25/2013  . Radiology with anesthesia N/A 07/30/2014    Procedure: MRI BRAIN WITH AND WITHOUT,MRI LUMBER WITHOUT  CONTRAST,MRI THORACIC;  Surgeon: Medication Radiologist, MD;  Location: [place];  Service: Radiology;  Laterality: N/A;  ergies:   Allergies  Allergen Reactions  . Atorvastatin Other (See Comments)    Causes constipation  . Penicillins Other (See Comments)    Other reaction(s): RASH HIVES  Tolerates cefepime 2/22     Medications: I have reviewed patients current medications as documented in Epic Anti-infectives    Start     Dose/Rate Route Frequency Ordered Stop   09/01/14 0400  cefTAZidime (FORTAZ) 1 g in dextrose 5 % 50 mL IVPB     1 g 100 mL/hr over 30 Minutes Intravenous Every 12 hours 08/31/14 2201     08/31/14 1500  vancomycin (VANCOCIN) 50 mg/mL oral solution 125 mg     125 mg Oral 4 times daily 08/31/14 1444     08/31/14 1445  ceFEPIme (MAXIPIME) 2 g in dextrose 5 % 50 mL IVPB  Status:  Discontinued     2 g 100 mL/hr over 30 Minutes Intravenous Every 12 hours 08/31/14 1444 08/31/14 2154      Social History:  reports that he quit smoking about 29 years ago. His smoking use included Cigarettes. He started smoking about 65 years ago. He has a 74 pack-year smoking history. He has quit using smokeless tobacco. He reports that he does not drink alcohol or use illicit drugs.  Family History  Problem Relation Age of Onset  . Lung cancer Father     As in HPI and primary teams notes otherwise 12 point review of systems is not obtainable  Blood pressure 142/80, pulse 60, temperature 98.1 F (36.7 C), temperature source Oral, resp. rate 16, SpO2 95 %.   General: confused trying to pull PEG tube out  HEENT: , EOMI,  CVS regular rate, normal r,   Chest, no wheezing, resp distress Abdomen: soft , bandage around PEG site Extremities: no  clubbing or edema noted bilaterally Skin: no rashes Neuro: nonfocal, strength and sensation intact   Results for orders placed or performed during the hospital encounter of 08/31/14 (from the past 48 hour(s))  CBG monitoring, ED     Status:  None   Collection Time: 08/31/14  7:44 AM  Result Value Ref Range   Glucose-Capillary 78 65 - 99 mg/dL  Clostridium Difficile by PCR (not at [facility])     Status: None   Collection Time: 08/31/14  8:05 AM  Result Value Ref Range   C difficile by pcr NEGATIVE NEGATIVE  Urinalysis, Routine w reflex microscopic (not at [facility])     Status: Abnormal   Collection Time: 08/31/14  8:12 AM  Result Value Ref Range   Color, Urine YELLOW YELLOW   APPearance CLEAR CLEAR   Specific Gravity, Urine 1.017 1.005 - 1.030   pH 6.5 5.0 - 8.0   Glucose, UA NEGATIVE NEGATIVE mg/dL   Hgb urine dipstick MODERATE (A) NEGATIVE   Bilirubin Urine NEGATIVE NEGATIVE   Ketones, ur NEGATIVE NEGATIVE mg/dL   Protein, ur 100 (A) NEGATIVE mg/dL   Urobilinogen, UA 0.2 0.0 - 1.0 mg/dL   Nitrite NEGATIVE NEGATIVE   Leukocytes, UA NEGATIVE NEGATIVE  Urine culture     Status: None (Preliminary result)   Collection Time: 08/31/14  8:12 AM  Result Value Ref Range   Specimen Description URINE, CATHETERIZED    Special Requests NONE    Culture CULTURE REINCUBATED FOR BETTER GROWTH    Report Status PENDING   Urine microscopic-add on     Status: Abnormal   Collection Time: 08/31/14  8:12 AM  Result Value Ref Range   Squamous Epithelial / LPF RARE RARE   WBC, UA 3-6 <3 WBC/hpf   RBC / HPF 3-6 <3 RBC/hpf   Bacteria, UA FEW (A) RARE   Casts HYALINE CASTS (A) NEGATIVE    Comment: GRANULAR CAST  Culture, blood (routine x 2)     Status: None (Preliminary result)   Collection Time: 08/31/14  8:30 AM  Result Value Ref Range   Specimen Description BLOOD RIGHT ARM    Special Requests BOTTLES DRAWN AEROBIC AND ANAEROBIC R 5CC B 10CC    Culture NO GROWTH 1 DAY    Report Status PENDING   Culture, blood (routine x 2)     Status: None (Preliminary result)   Collection Time: 08/31/14  8:35 AM  Result Value Ref Range   Specimen Description BLOOD RIGHT HAND    Special Requests BOTTLES DRAWN AEROBIC ONLY 10CC    Culture NO GROWTH 1 DAY      Report Status PENDING   CBC with Differential/Platelet     Status: Abnormal  Collection Time: 08/31/14  8:36 AM  Result Value Ref Range   WBC 4.5 4.0 - 10.5 K/uL   RBC 4.18 (L) 4.22 - 5.81 MIL/uL   Hemoglobin 11.8 (L) 13.0 - 17.0 g/dL   HCT 36.6 (L) 39.0 - 52.0 %   MCV 87.6 78.0 - 100.0 fL   MCH 28.2 26.0 - 34.0 pg   MCHC 32.2 30.0 - 36.0 g/dL   RDW 15.5 11.5 - 15.5 %   Platelets 96 (L) 150 - 400 K/uL    Comment: REPEATED TO VERIFY PLATELET COUNT CONFIRMED BY SMEAR    Neutrophils Relative % 68 43 - 77 %   Neutro Abs 3.0 1.7 - 7.7 K/uL   Lymphocytes Relative 20 12 - 46 %   Lymphs Abs 0.9 0.7 - 4.0 K/uL   Monocytes Relative 12 3 - 12 %   Monocytes Absolute 0.5 0.1 - 1.0 K/uL   Eosinophils Relative 0 0 - 5 %   Eosinophils Absolute 0.0 0.0 - 0.7 K/uL   Basophils Relative 1 0 - 1 %   Basophils Absolute 0.0 0.0 - 0.1 K/uL  Comprehensive metabolic panel     Status: Abnormal   Collection Time: 08/31/14  8:36 AM  Result Value Ref Range   Sodium 141 135 - 145 mmol/L   Potassium 3.6 3.5 - 5.1 mmol/L   Chloride 108 101 - 111 mmol/L   CO2 25 22 - 32 mmol/L   Glucose, Bld 98 65 - 99 mg/dL   BUN 43 (H) 6 - 20 mg/dL   Creatinine, Ser 1.43 (H) 0.61 - 1.24 mg/dL   Calcium 10.1 8.9 - 10.3 mg/dL   Total Protein 7.1 6.5 - 8.1 g/dL   Albumin 2.4 (L) 3.5 - 5.0 g/dL   AST 31 15 - 41 U/L   ALT 24 17 - 63 U/L   Alkaline Phosphatase 85 38 - 126 U/L   Total Bilirubin 0.6 0.3 - 1.2 mg/dL   GFR calc non Af Amer 44 (L) >60 mL/min   GFR calc Af Amer 51 (L) >60 mL/min    Comment: (NOTE) The eGFR has been calculated using the CKD EPI equation. This calculation has not been validated in all clinical situations. eGFR's persistently <60 mL/min signify possible Chronic Kidney Disease.    Anion gap 8 5 - 15  Vancomycin, random     Status: None   Collection Time: 08/31/14  8:36 AM  Result Value Ref Range   Vancomycin Rm <4 ug/mL    Comment: RESULTS CONFIRMED BY MANUAL DILUTION        Random  Vancomycin therapeutic range is dependent on dosage and time of specimen collection. A peak range is 20.0-40.0 ug/mL A trough range is 5.0-15.0 ug/mL          I-stat troponin, ED     Status: None   Collection Time: 08/31/14  8:45 AM  Result Value Ref Range   Troponin i, poc 0.02 0.00 - 0.08 ng/mL   Comment 3            Comment: Due to the release kinetics of cTnI, a negative result within the first hours of the onset of symptoms does not rule out myocardial infarction with certainty. If myocardial infarction is still suspected, repeat the test at appropriate intervals.   I-Stat CG4 Lactic Acid, ED     Status: None   Collection Time: 08/31/14  8:47 AM  Result Value Ref Range   Lactic Acid, Venous 0.73 0.5 - 2.0 mmol/L   @  BRIEFLABTABLE(sdes,specrequest,cult,reptstatus)   ) Recent Results (from the past 720 hour(s))  Clostridium Difficile by PCR (not at [facility])     Status: None   Collection Time: 08/31/14  8:05 AM  Result Value Ref Range Status   C difficile by pcr NEGATIVE NEGATIVE Final  Urine culture     Status: None (Preliminary result)   Collection Time: 08/31/14  8:12 AM  Result Value Ref Range Status   Specimen Description URINE, CATHETERIZED  Final   Special Requests NONE  Final   Culture CULTURE REINCUBATED FOR BETTER GROWTH  Final   Report Status PENDING  Incomplete  Culture, blood (routine x 2)     Status: None (Preliminary result)   Collection Time: 08/31/14  8:30 AM  Result Value Ref Range Status   Specimen Description BLOOD RIGHT ARM  Final   Special Requests BOTTLES DRAWN AEROBIC AND ANAEROBIC R 5CC B 10CC  Final   Culture NO GROWTH 1 DAY  Final   Report Status PENDING  Incomplete  Culture, blood (routine x 2)     Status: None (Preliminary result)   Collection Time: 08/31/14  8:35 AM  Result Value Ref Range Status   Specimen Description BLOOD RIGHT HAND  Final   Special Requests BOTTLES DRAWN AEROBIC ONLY 10CC  Final   Culture NO GROWTH 1 DAY  Final    Report Status PENDING  Incomplete     Impression/Recommendation  Active Problems:   * No active hospital problems. *   [name] is a 81 y.o. male with thoraco-abdominal aortic aneurysm with pseudomonas  Infection + possible proteus who we have attempted to suppress with oral FQ then IV cefepime with course complicated by CDI who has had continued decline neurologically.  I am fully supportive of departure from aggressive care and instead pursuing a purely palliative approach.  I had extensive discussion with both daughters and called Palliative care Consult after leaving ED.  I spent greater than 60 minutes with the patient including greater than 50% of time in face to face counsel of the patient's family re his prognosis and options for treatment  and in coordination of their care.    It seems fairly clear to me that the patients daughters will make decision for purely palliative care approach and if this is the case obviously should DC abx, DC PICC and PEG  I will sign off for now but should ID input be further needed this week at [facility], Dr Linus Salmons will be on call for formal consults in the hospital.     09/01/2014, 1:19 PM   Thank you so much for this interesting consult  [place] for [place] (pager) [phone] (office) 09/01/2014, 1:19 PM  Rhina [place] 09/01/2014, 1:19 PM

## 2014-09-01 NOTE — ED Notes (Signed)
Pt's family met with hospice and pallative care MD to discuss further needs for pt.

## 2014-09-01 NOTE — Progress Notes (Addendum)
ED CM spoke with Dr. Rowe Pavy concerning patient's plan of care., Dr. Rowe Pavy discussed with Daughter Bonita Quin  goals of care, as per conversation daughter is wanting comfort care for patient.  ED CM spoke with concerning recommendations for plan of care she is agreeable with discharge plan. Discussed the residential hospice facilities and the process. Daughter requesting a referral be sent to [facility], if a bed is available, placed call to  [facility], for available bed, none available. Placed call with [facility] as well. Daughter will be here  today at 2:30,Dr. Rowe Pavy will meet with patient and family today. Contacted [employer] for [facility] regarding referral. Spoke with Cassandra CSW regarding faxing referral to [facility] for Residential Hospice. Updated Dr. Rowe Pavy,  Dr.Zavitz and Narda Rutherford RN concerning disposition plan.

## 2014-09-01 NOTE — Progress Notes (Signed)
CSW was contacted by ED CM Mariann Laster concerning Pt placement at [facility] possibly tomorrow. Mariann Laster stated she had spoken with the coordinator Lattie Haw and coordinator will be meeting with the family a 14:30 on 6/19.   Coordinator requested that [place] go ahead and place the request through referral line so that [facility] has Pt information and process for transferring Pt could be in place.   CSW awaiting a call back for referral.    [facility]  2S, 100M,3S, 5N, 6N [phone]

## 2014-09-01 NOTE — ED Notes (Signed)
Called staffing for safety sitter d/t agitation/pulling at [facility]

## 2014-09-01 NOTE — ED Provider Notes (Signed)
Called to room due to agitation Pt with episodes of agitation but medically clear Haldol ordered He did attempt to pull PICC, CXR performed and negative Awaiting placement BP 127/82 mmHg  Pulse 77  Temp(Src) 98.1 F (36.7 C) (Oral)  Resp 16  SpO2 100%   Ripley Fraise, MD 09/01/14 [phone]

## 2014-09-01 NOTE — ED Notes (Signed)
Pt crying and yelling stating to "help with my back pain."  RN spoke with Md and got orders for pt pain control.

## 2014-09-01 NOTE — Progress Notes (Signed)
CSW sent in the referral for review and [facility] will possibly have a bed available in the morning.  CSW received a call back from [place] Lattie Haw also stated she spoke with Nira Conn from [facility]) concerning Pt referral to [employer]. Liason stated that she spoke with the family and the family has made the decision for placement into [facility] as the family feels Pt would be appropriate for the Hospice Services. Lattie Haw stated that the family did have the [place] meeting this evening and changed Pt code status to DNR with comfort care implemented Pt's wife made the final decision for Pt care.    CSW will pass on information to the ED CSW for follow up assistance with d/c planning needs.    [facility]  2S, 20M,3S, 5N, 6N [phone]

## 2014-09-01 NOTE — Consult Note (Addendum)
[facility] Consult   Reason for Consult: agitation, combative Referring Physician: Dr. Jeneen Rinks Patient Identification: [name] MRN:  [id number] Principal Diagnosis: Dementia with behavior disturbance Diagnosis:   Patient Active Problem List   Diagnosis Date Noted  . Delirium [R41.0]   . Weakness [R53.1]   . Chronic bacteremia [R78.81]   . Encounter for palliative care [Z51.5]   . Thoracic aortic aneurysm without rupture [I71.2]   . Pseudomonas infection [B96.5]   . Proteus infection [B96.4]   . Somnolence [R40.0]   . Dementia [F03.90]   . Goals of care, counseling/discussion [Z71.89]   . DVT (deep venous thrombosis) [I82.409] 08/05/2014  . Subarachnoid hemorrhage [I60.9] 07/26/2014  . Malnutrition of moderate degree [E44.0] 07/26/2014  . Acute right-sided weakness [M62.89] 07/25/2014  . Subdural hematoma [I62.00] 07/25/2014  . Recurrent colitis due to Clostridium difficile [A04.7] 05/02/2014  . History of endovascular stent graft for abdominal aortic aneurysm [Z95.828] 05/02/2014  . S/P AAA repair 2014 with chronic graft infection [Z98.89] 05/01/2014  . Aortic stenosis, moderate [I35.0] 05/01/2014  . Cardiomyopathy-EF 35-40% by echo Jan 2016 [I42.9] 05/01/2014  . Prolonged Q-T interval on ECG [I45.81] 05/01/2014  . 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 FTT (failure to thrive) in adult [R62.7] 03/30/2014  . Counseling regarding advanced directives and goals of care [Z71.89] 03/01/2014  . History of Bacteremia due to Pseudomonas [R78.81, B96.5] 02/21/2014  . Stage III chronic kidney disease [N18.3] 02/21/2014  . Status post insertion of percutaneous endoscopic gastrostomy (PEG) tube [Z93.1] 02/21/2014  . Protein-calorie malnutrition, severe [E43] 02/08/2014  . History of DVT of lower extremity (Feb 2015) -post IVC filter [W09.811] 05/03/2013  . Chronic Thrombocytopenia [D69.6] 04/30/2013  . Renal stone [N20.0] 04/25/2013  . Osteoarthritis of right knee [M17.9]  07/26/2012  . HTN (hypertension) [I10] 05/19/2012    Total Time spent with patient: 1 hour  Subjective:   [name] is a 81 y.o. male patient admitted with altered mental stratus  HPI:  Thanks for asking me to do a psychiatric consult on [name], 81 y.o. Male with history of chronic bacteremia, multiple medical problems and Dementia. He was brought to the emergency room from [facility] facility due to altered mental status and combativeness towards staff for the past 3 days after a fall. Patient is a poor historian who is unable to give a coherent history, most information are obtained from the charts and the staffs. Patient reportedly became aggressive, combative, agitated towards staffs, pulled his G-tube and attempting to get out of bed. Patient, interviewed in bed and chart reviewed. He is calm, cooperative, he is alert, oriented to person but disoriented to time, place and situation. He made good eye contact, but had trouble hearing. He appears restless, slightly fidgety with constricted affect and dysphoric mood. He denies depression, SI, HI, AVH. But he looks somewhat depressed, however, records indicates that he had no previous history of mental illness. Per record, patient has been feeding throughG tube. Pt denies abuse and says he likes living at [facility].Per the case manager at the ED, family has decided to place patient in hospice/palliative care.  HPI Elements:   Location:  agitation, combative, dementia. Quality:  moderate.  Past Medical History:  Past Medical History  Diagnosis Date  . Hypertension   . BPH (benign prostatic hyperplasia)   . Hyperlipidemia   . AAA (abdominal aortic aneurysm) 04/2012    STENTING OF AAA IN [place]  . Junctional cardiac arrhythmia  Occurred postoperatively after urologic surgery  . Severe sinus bradycardia     Occurred postoperatively after urologic surgery  . Aortic valve disorders   . Other primary  cardiomyopathies   . Dysphagia   . Status post insertion of percutaneous endoscopic gastrostomy (PEG) tube 02/11/14  . Bacteremia due to Pseudomonas   . Infected aortic graft   . DVT (deep venous thrombosis) 04/2013    s/p IVC Filter  . Cancer 2008-2009    prostate TREATED WITH RADIATION    Past Surgical History  Procedure Laterality Date  . Colectomy      FOR DIVERTICULITIS  . Appendectomy    . Hernia repair    . Bladder surgery  2008    FOR BLADDER STONE  . Total knee arthroplasty Left 1990  . Abdominal aortic aneurysm repair  04/2012  . Total knee arthroplasty Right 07/26/2012    Procedure: RIGHT TOTAL KNEE ARTHROPLASTY;  Surgeon: Tobi Bastos, MD;  Location: [facility];  Service: Orthopedics;  Laterality: Right;  . Nephrolithotomy Left 04/26/2013    Procedure: LEFT PERCUTANEOUS NEPHROLITHOTOMY ;  Surgeon: [name], MD;  Location: [facility];  Service: Urology;  Laterality: Left;  . Nephrolithotomy Left 05/03/2013    Procedure: 2ND STAGE LEFT PERCUTANEOUS NEPHROLITHOTOMY ;  Surgeon: [name], MD;  Location: [facility];  Service: Urology;  Laterality: Left;  . Esophagogastroduodenoscopy N/A 02/11/2014    OZD:GUYQI hiatal hernia. Patchy erythema of the gastric mucosa; otherwise negative EGD. Status post 74 French Microvasive PEG tube placement  . Peg placement N/A 02/11/2014    Procedure: PERCUTANEOUS ENDOSCOPIC GASTROSTOMY (PEG) PLACEMENT;  Surgeon: Daneil Dolin, MD;  Location: [facility];  Service: Endoscopy;  Laterality: N/A;  . Tee without cardioversion N/A 05/02/2014    Procedure: TRANSESOPHAGEAL ECHOCARDIOGRAM (TEE);  Surgeon: Lelon Perla, MD;  Location: [facility] ENDOSCOPY;  Service: Cardiovascular;  Laterality: N/A;  . Joint replacement    . Cataract extraction, bilateral Bilateral   . Vena cava filter placement  2015    Archie Endo 05/14/2013  . Prostate biopsy      hx/notes 04/25/2013  . Hemorrhoid surgery      hx/notes 04/25/2013  . Transurethral resection of prostate       hx/notes 04/25/2013  . Radiology with anesthesia N/A 07/30/2014    Procedure: MRI BRAIN WITH AND WITHOUT,MRI LUMBER WITHOUT CONTRAST,MRI THORACIC;  Surgeon: Medication Radiologist, MD;  Location: [facility];  Service: Radiology;  Laterality: N/A;   Family History:  Family History  Problem Relation Age of Onset  . Lung cancer Father    Social History:  History  Alcohol Use No     History  Drug Use No    History   Social History  . Marital Status: Married    Spouse Name: N/A  . Number of Children: N/A  . Years of Education: N/A   Social History Main Topics  . Smoking status: Former Smoker -- 2.00 packs/day for 37 years    Types: Cigarettes    Start date: 02/17/1949    Quit date: 03/15/1985  . Smokeless tobacco: Former Systems developer  . Alcohol Use: No  . Drug Use: No  . Sexual Activity: No   Other Topics Concern  . None   Social History Narrative   Additional Social History:    Pain Medications: denies Prescriptions: denies Over the Counter: denies History of alcohol / drug use?:  (denies) Negative Consequences of Use:  (denies) Withdrawal Symptoms:  (denies)  Allergies:   Allergies  Allergen Reactions  . Atorvastatin Other (See Comments)    Causes constipation  . Penicillins Other (See Comments)    Other reaction(s): RASH HIVES  Tolerates cefepime 2/22    Labs:  Results for orders placed or performed during the hospital encounter of 08/31/14 (from the past 48 hour(s))  CBG monitoring, ED     Status: None   Collection Time: 08/31/14  7:44 AM  Result Value Ref Range   Glucose-Capillary 78 65 - 99 mg/dL  Clostridium Difficile by PCR (not at [facility])     Status: None   Collection Time: 08/31/14  8:05 AM  Result Value Ref Range   C difficile by pcr NEGATIVE NEGATIVE  Urinalysis, Routine w reflex microscopic (not at [facility])     Status: Abnormal   Collection Time: 08/31/14  8:12 AM  Result Value Ref Range   Color, Urine YELLOW YELLOW   APPearance  CLEAR CLEAR   Specific Gravity, Urine 1.017 1.005 - 1.030   pH 6.5 5.0 - 8.0   Glucose, UA NEGATIVE NEGATIVE mg/dL   Hgb urine dipstick MODERATE (A) NEGATIVE   Bilirubin Urine NEGATIVE NEGATIVE   Ketones, ur NEGATIVE NEGATIVE mg/dL   Protein, ur 100 (A) NEGATIVE mg/dL   Urobilinogen, UA 0.2 0.0 - 1.0 mg/dL   Nitrite NEGATIVE NEGATIVE   Leukocytes, UA NEGATIVE NEGATIVE  Urine culture     Status: None (Preliminary result)   Collection Time: 08/31/14  8:12 AM  Result Value Ref Range   Specimen Description URINE, CATHETERIZED    Special Requests NONE    Culture CULTURE REINCUBATED FOR BETTER GROWTH    Report Status PENDING   Urine microscopic-add on     Status: Abnormal   Collection Time: 08/31/14  8:12 AM  Result Value Ref Range   Squamous Epithelial / LPF RARE RARE   WBC, UA 3-6 <3 WBC/hpf   RBC / HPF 3-6 <3 RBC/hpf   Bacteria, UA FEW (A) RARE   Casts HYALINE CASTS (A) NEGATIVE    Comment: GRANULAR CAST  Culture, blood (routine x 2)     Status: None (Preliminary result)   Collection Time: 08/31/14  8:30 AM  Result Value Ref Range   Specimen Description BLOOD RIGHT ARM    Special Requests BOTTLES DRAWN AEROBIC AND ANAEROBIC R 5CC B 10CC    Culture NO GROWTH 1 DAY    Report Status PENDING   Culture, blood (routine x 2)     Status: None (Preliminary result)   Collection Time: 08/31/14  8:35 AM  Result Value Ref Range   Specimen Description BLOOD RIGHT HAND    Special Requests BOTTLES DRAWN AEROBIC ONLY 10CC    Culture NO GROWTH 1 DAY    Report Status PENDING   CBC with Differential/Platelet     Status: Abnormal   Collection Time: 08/31/14  8:36 AM  Result Value Ref Range   WBC 4.5 4.0 - 10.5 K/uL   RBC 4.18 (L) 4.22 - 5.81 MIL/uL   Hemoglobin 11.8 (L) 13.0 - 17.0 g/dL   HCT 36.6 (L) 39.0 - 52.0 %   MCV 87.6 78.0 - 100.0 fL   MCH 28.2 26.0 - 34.0 pg   MCHC 32.2 30.0 - 36.0 g/dL   RDW 15.5 11.5 - 15.5 %   Platelets 96 (L) 150 - 400 K/uL    Comment: REPEATED TO  VERIFY PLATELET COUNT CONFIRMED BY SMEAR    Neutrophils Relative % 68 43 - 77 %  Neutro Abs 3.0 1.7 - 7.7 K/uL   Lymphocytes Relative 20 12 - 46 %   Lymphs Abs 0.9 0.7 - 4.0 K/uL   Monocytes Relative 12 3 - 12 %   Monocytes Absolute 0.5 0.1 - 1.0 K/uL   Eosinophils Relative 0 0 - 5 %   Eosinophils Absolute 0.0 0.0 - 0.7 K/uL   Basophils Relative 1 0 - 1 %   Basophils Absolute 0.0 0.0 - 0.1 K/uL  Comprehensive metabolic panel     Status: Abnormal   Collection Time: 08/31/14  8:36 AM  Result Value Ref Range   Sodium 141 135 - 145 mmol/L   Potassium 3.6 3.5 - 5.1 mmol/L   Chloride 108 101 - 111 mmol/L   CO2 25 22 - 32 mmol/L   Glucose, Bld 98 65 - 99 mg/dL   BUN 43 (H) 6 - 20 mg/dL   Creatinine, Ser 1.43 (H) 0.61 - 1.24 mg/dL   Calcium 10.1 8.9 - 10.3 mg/dL   Total Protein 7.1 6.5 - 8.1 g/dL   Albumin 2.4 (L) 3.5 - 5.0 g/dL   AST 31 15 - 41 U/L   ALT 24 17 - 63 U/L   Alkaline Phosphatase 85 38 - 126 U/L   Total Bilirubin 0.6 0.3 - 1.2 mg/dL   GFR calc non Af Amer 44 (L) >60 mL/min   GFR calc Af Amer 51 (L) >60 mL/min    Comment: (NOTE) The eGFR has been calculated using the CKD EPI equation. This calculation has not been validated in all clinical situations. eGFR's persistently <60 mL/min signify possible Chronic Kidney Disease.    Anion gap 8 5 - 15  Vancomycin, random     Status: None   Collection Time: 08/31/14  8:36 AM  Result Value Ref Range   Vancomycin Rm <4 ug/mL    Comment: RESULTS CONFIRMED BY MANUAL DILUTION        Random Vancomycin therapeutic range is dependent on dosage and time of specimen collection. A peak range is 20.0-40.0 ug/mL A trough range is 5.0-15.0 ug/mL          I-stat troponin, ED     Status: None   Collection Time: 08/31/14  8:45 AM  Result Value Ref Range   Troponin i, poc 0.02 0.00 - 0.08 ng/mL   Comment 3            Comment: Due to the release kinetics of cTnI, a negative result within the first hours of the onset of symptoms  does not rule out myocardial infarction with certainty. If myocardial infarction is still suspected, repeat the test at appropriate intervals.   I-Stat CG4 Lactic Acid, ED     Status: None   Collection Time: 08/31/14  8:47 AM  Result Value Ref Range   Lactic Acid, Venous 0.73 0.5 - 2.0 mmol/L    Vitals: Blood pressure 132/65, pulse 60, temperature 98.2 F (36.8 C), temperature source Axillary, resp. rate 16, SpO2 95 %.  Risk to Self: Suicidal Ideation: No Suicidal Intent: No Is patient at risk for suicide?: No Suicidal Plan?: No Access to Means: No What has been your use of drugs/alcohol within the last 12 months?:  (none) Intentional Self Injurious Behavior:  (pulling out feeding tube) Risk to Others: Homicidal Ideation: No Thoughts of Harm to Others: No Current Homicidal Intent: No Current Homicidal Plan: No Access to Homicidal Means: No History of harm to others?:  (has been aggressive towards staff at [facility] since Tuesday) Assessment of  Violence:  (aggitated since Tuesday off and on) Violent Behavior Description:  (striking at staff, trying to get out of bed) Does patient have access to weapons?: No Criminal Charges Pending?: No Does patient have a court date: No Prior Inpatient Therapy: Prior Inpatient Therapy: No Prior Outpatient Therapy: Prior Outpatient Therapy: No  Current Facility-Administered Medications  Medication Dose Route Frequency Provider Last Rate Last Dose  . acetaminophen (TYLENOL) tablet 1,000 mg  1,000 mg Oral Q6H PRN Tanna Furry, MD   1,000 mg at 09/01/14 0555  . haloperidol lactate (HALDOL) injection 2 mg  2 mg Intravenous Q3H PRN Loistine Chance, MD      . HYDROcodone-acetaminophen (NORCO/VICODIN) 5-325 MG per tablet 1 tablet  1 tablet Oral Q6H PRN Tanna Furry, MD   1 tablet at 09/01/14 1125  . morphine 2 MG/ML injection 2 mg  2 mg Intravenous Q3H PRN Loistine Chance, MD      . ondansetron (ZOFRAN) injection 4 mg  4 mg Intravenous Q6H PRN Loistine Chance, MD       . risperiDONE (RISPERDAL M-TABS) disintegrating tablet 0.5 mg  0.5 mg Oral BID PRN Tanna Furry, MD   0.5 mg at 09/01/14 0602   Current Outpatient Prescriptions  Medication Sig Dispense Refill  . acetaminophen (TYLENOL) 500 MG tablet Take 1,000 mg by mouth every 6 (six) hours as needed for mild pain or moderate pain.    . Amino Acids-Protein Hydrolys (FEEDING SUPPLEMENT, PRO-STAT SUGAR FREE 64,) LIQD Place 30 mLs into feeding tube 3 (three) times daily. 900 mL 0  . ceFEPIme 2 g in dextrose 5 % 50 mL Inject 2 g into the vein every 12 (twelve) hours. 30 each 0  . diphenhydrAMINE (BENADRYL) 12.5 MG/5ML elixir Take 5 mLs (12.5 mg total) by mouth at bedtime as needed for sleep. 120 mL 0  . HYDROcodone-acetaminophen (NORCO/VICODIN) 5-325 MG per tablet Take 1 tablet by mouth every 6 (six) hours as needed for moderate pain. 15 tablet 0  . LORazepam in dextrose solution Take 1 mg by mouth every 4 (four) hours as needed (Sedation).    . magnesium oxide (MAG-OX) 400 (241.3 MG) MG tablet Take 0.5 tablets (200 mg total) by mouth daily.    . mirtazapine (REMERON) 7.5 MG tablet Take 7.5 mg by mouth at bedtime.    . Multiple Vitamin (MULTI-VITAMIN PO) Take 1 tablet by mouth daily.    . Nutritional Supplements (FEEDING SUPPLEMENT, JEVITY 1.2 CAL,) LIQD Place 237 mLs into feeding tube 2 (two) times daily with breakfast and lunch.  0  . Nutritional Supplements (FEEDING SUPPLEMENT, JEVITY 1.2 CAL,) LIQD Place 1,000 mLs into feeding tube continuous.  0  . simvastatin (ZOCOR) 20 MG tablet Take 1 tablet (20 mg total) by mouth daily. (Patient taking differently: Take 20 mg by mouth daily at 6 PM. ) 30 tablet 0  . tolterodine (DETROL LA) 4 MG 24 hr capsule Take 4 mg by mouth 2 (two) times daily.    . vancomycin (VANCOCIN) 50 mg/mL oral solution Take 2.5 mLs (125 mg total) by mouth 4 (four) times daily. 300 mL 5  . Water For Irrigation, Sterile (FREE WATER) SOLN Place 200 mLs into feeding tube 4 (four) times daily.    .  bisacodyl (DULCOLAX) 10 MG suppository Place 1 suppository (10 mg total) rectally as needed for mild constipation or moderate constipation. 12 suppository 0  . dronabinol (MARINOL) 2.5 MG capsule Take 1 capsule (2.5 mg total) by mouth 3 (three) times daily. (Patient not taking: Reported  on 08/28/2014) 90 capsule 2    Musculoskeletal: Strength & Muscle Tone: decreased Gait & Station: unable to stand Patient leans: N/A  Psychiatric Specialty Exam: Physical Exam  Psychiatric: Thought content normal. His mood appears anxious. His speech is delayed. He is combative. Cognition and memory are impaired. He expresses impulsivity.    Review of Systems  Constitutional: Positive for malaise/fatigue.  HENT: Negative.   Eyes: Negative.   Respiratory: Negative.   Cardiovascular: Negative.   Skin: Negative.   Neurological: Positive for weakness.  Psychiatric/Behavioral: The patient is nervous/anxious.     Blood pressure 132/65, pulse 60, temperature 98.2 F (36.8 C), temperature source Axillary, resp. rate 16, SpO2 95 %.There is no weight on file to calculate BMI.  General Appearance: Casual  Eye Contact::  Minimal  Speech:  Slow  Volume:  Normal  Mood:  Anxious and Dysphoric  Affect:  Constricted  Thought Process:  unable to assess  Orientation:  Other:  not to place, time or situation  Thought Content:  unable to assess  Suicidal Thoughts:  No  Homicidal Thoughts:  No  Memory:  Immediate;   Poor Recent;   Poor Remote;   Poor  Judgement:  Impaired  Insight:  Lacking  Psychomotor Activity:  Increased  Concentration:  Poor  Recall:  Poor  Fund of Knowledge:Poor  Language: Fair  Akathisia:  No  Handed:  Right  AIMS (if indicated):     Assets:  Social Support  ADL's:  Impaired  Cognition: Impaired,  Moderate  Sleep:   poor   Medical Decision Making: Established Problem, Stable/Improving (1), Review or order clinical lab tests (1), Review of Medication Regimen & Side Effects (2) and  Review of New Medication or Change in Dosage (2)  Treatment Plan Summary: Daily contact with patient to assess and evaluate symptoms and progress in treatment;  Medication management : Continue Risperdal 0.29m bid as needed for agitation. Initiate Celexa 143mdaily for depression/aggressive behavior.  Plan:  No evidence of imminent risk to self or others at present.   Patient does not meet criteria for psychiatric inpatient admission.   Agreed with famiy's plan to place patient in palliative care/Hospice Disposition: as above  AkCorena PilgrimMD 09/01/2014 5:18 PM

## 2014-09-01 NOTE — ED Notes (Signed)
Gave pt sips of water.

## 2014-09-01 NOTE — ED Notes (Signed)
Pt heard whimpering from down the hall, RN went to room and asked pt what was wrong. Pt states he's hurting. Pt offered tylenol, pt refusing medications. Tylenol to be given per PEG tube

## 2014-09-01 NOTE — Progress Notes (Signed)
CM received call from ED CM requesting background on pt.  CM explained pt's wife was seaking SNF placement so am unsure wife has understanding of home hospice support will not include the daily maintenance of the pt.  Residential hospice may be the appropriate environment and ED CM states she will contact CSW.   Late entry 08/31/14 CM spoke with ED MD as pt referred to ED by [facility].  CM called [facility] rep, Tiffany who states HHRN did not have a choice and wife cannot take care of husband at home as he is very agitated.  CM called CSW, Twann for placement.

## 2014-09-01 NOTE — Progress Notes (Signed)
Family meeting with  3 daughters and hospice liaison:   Patient's current medical conditions discussed in detail, discussed signs and symptoms associated with the dying process discussed in detail.  All questions answered to the best of my ability Daughters are tearful but endorse that the patient has stopped eating, he has asked for his PEG tube to be removed, he has been voicing that he is "tired"  Discussed comfort measures only, discussed care that can be provided at inpatient hospice facility.  DNR DNI Anticipate transfer to hospice in am 35 minutes spent Loistine Chance, [place] [phone]

## 2014-09-01 NOTE — Progress Notes (Signed)
ED CM consulted by Janie RN on Pod C regarding patient's family considering hospice/palliative care. ED CM reviewed patient's record, patient has had 6 admissions and 3 ED visits in the past six months, as per family he is becoming progressively worse. Family report is for the last almost 2 weeks has been lethargic, confused,and combative. Initially placement was being sought for [facility] facility however, family is deciding on hospice/ palliative care. [facility] Palliative team consulted on patient back Jan 2016. Family is no longer able to manage care at home. CSW was notified of possible hospice placement, Consult was also made to [facility] Infectious Disease, and Palliative Team. ED CM will continue to follow

## 2014-09-01 NOTE — ED Notes (Signed)
Pt calling out for [name], stating "she wants me to die." Threatening to pull out PICC line when RN attempting to secure with tape. Pt thrashing around in bed. Calling out for [place]. RN attempting to redirect/reassure that daughters have gone home for the night and will return to visit him. Unable to redirect.

## 2014-09-02 ENCOUNTER — Emergency Department (HOSPITAL_COMMUNITY): Payer: Medicare Other

## 2014-09-02 DIAGNOSIS — R7881 Bacteremia: Secondary | ICD-10-CM | POA: Insufficient documentation

## 2014-09-02 MED ORDER — LIDOCAINE VISCOUS 2 % MT SOLN
OROMUCOSAL | Status: AC
  Filled 2014-09-02: qty 15

## 2014-09-02 NOTE — Progress Notes (Addendum)
1239:  Spoke with Erling Conte, aware of patient and family just completed paperwork.  Once peg is removed, patient is able to transfer to [facility] by EMS.  LCSW will fax most recent progress note to facility as requested.  RN in ED aware per Harmon Pier regarding disposition.   LCSW following case and message left for hospital liaison Erling Conte [phone]) Awaiting if she met with family and understanding when to move patient/bed available today or not?     Will continue to follow and assist with disposition.  [name], MSW Clinical Social Work: Emergency Room [phone]

## 2014-09-02 NOTE — ED Notes (Signed)
Pt transported to IR 

## 2014-09-02 NOTE — ED Notes (Signed)
[facility]AR here to transport patient to [facility]. °

## 2014-09-02 NOTE — ED Notes (Signed)
Case manager from [facility] advised that patient can come to [facility] after IR. D/c paperwork needs to be faxed to [facility] [phone], report can be called to [phone]. Harmon Pier, Case manager left her number, [phone]

## 2014-09-02 NOTE — ED Notes (Signed)
PTAR contacted for transport to [facility]

## 2014-09-02 NOTE — Progress Notes (Signed)
Met with patient's 3 daughters and Hospice liaison:  Patient's family states patient has been asking for his PEG tube to be removed for some time now. With the focus of his care shifting to comfort feedings, family is requesting PEG tube to be removed.   IR will be consulted, otherwise, arrangements are taking place for the patient's transfer to [facility].  Loistine Chance Palliative medicine [phone]

## 2014-09-02 NOTE — ED Notes (Signed)
Received call from daughter requesting that palliative care Dr contact her. Call to palliative care and informed of the same.

## 2014-09-02 NOTE — Progress Notes (Signed)
Daily Progress Note   Patient Name: [name]       Date: 09/02/2014 DOB: [birth year]  Age: 81 y.o. MRN#: [id number] Attending Physician: No att. providers found Primary Care Physician: [name], MD Admit Date: 08/31/2014  Reason for Consultation/Follow-up: follow up  Subjective:  patient is awake resting in bed, hard of hearing, answers some questions appropriately  Interval Events: Portable DNR signed Received phone call from patient's daughter [name] [phone] regarding another family meeting to discuss with the patient about his condition, prognosis and the plan to move him to hospice.  Patient has been medicated with Haldol, remains awake but not entirely alert. Discussed with [name] in detail, will attempt to see the patient later today with the patient's daughters in the room for further discussions.   Length of Stay: none  Current Medications: Scheduled Meds:  . citalopram  10 mg Oral Daily    Continuous Infusions:    PRN Meds: acetaminophen, haloperidol lactate, HYDROcodone-acetaminophen, morphine injection, ondansetron (ZOFRAN) IV, risperiDONE  Palliative Performance Scale: 30%     Vital Signs: BP 158/102 mmHg  Pulse 92  Temp(Src) 99 F (37.2 C) (Oral)  Resp 18  SpO2 100% SpO2: SpO2: 100 % O2 Device: O2 Device: Not Delivered O2 Flow Rate:    Intake/output summary: No intake or output data in the 24 hours ending 09/02/14 0840 LBM:   Baseline Weight:   Most recent weight:    Physical Exam:   Elderly male NAD Clear S1 S2 Abdomen soft non tender, dressing around PEG site Skin warm dry No edema          Additional Data Reviewed: Recent Labs     08/31/14  0836  WBC  4.5  HGB  11.8*  PLT  96*  NA  141  BUN  43*  CREATININE  1.43*     Problem List:  Patient Active Problem List   Diagnosis Date Noted  . Delirium   . Weakness   . Chronic bacteremia   . Encounter for palliative care   . Thoracic aortic aneurysm without rupture    . Pseudomonas infection   . Proteus infection   . Somnolence   . Dementia   . Goals of care, counseling/discussion   . DVT (deep venous thrombosis) 08/05/2014  . Subarachnoid hemorrhage 07/26/2014  . Malnutrition of moderate degree 07/26/2014  . Acute right-sided weakness 07/25/2014  . Subdural hematoma 07/25/2014  . Recurrent colitis due to Clostridium difficile 05/02/2014  . History of endovascular stent graft for abdominal aortic aneurysm 05/02/2014  . S/P AAA repair 2014 with chronic graft infection 05/01/2014  . Aortic stenosis, moderate 05/01/2014  . Cardiomyopathy-EF 35-40% by echo Jan 2016 05/01/2014  . Prolonged Q-T interval on ECG 05/01/2014  . Mycotic aneurysm   . FTT (failure to thrive) in adult 03/30/2014  . Counseling regarding advanced directives and goals of care 03/01/2014  . History of Bacteremia due to Pseudomonas 02/21/2014  . Stage III chronic kidney disease 02/21/2014  . Status post insertion of percutaneous endoscopic gastrostomy (PEG) tube 02/21/2014  . Protein-calorie malnutrition, severe 02/08/2014  . 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 Chronic Thrombocytopenia 04/30/2013  . Renal stone 04/25/2013  . Osteoarthritis of right knee 07/26/2012  . HTN (hypertension) 05/19/2012     Palliative Care Assessment & Plan    Code Status:  DNR  Goals of Care:   possibly transfer to inpatient hospice for symptom management today.   Desire  for further Chaplaincy support:no  3. Symptom Management:   continue PRNs Haldol and Morphine.   4. Palliative Prophylaxis:  Stool Softener: yes  5. Prognosis: days to weeks.   5. Discharge Planning: Hospice facility   Care plan was discussed with  Patient, patient's grandson, daughter Elijah Rowan, RN  Thank you for allowing the Palliative Medicine Team to assist in the care of this patient.   Time In: 0800 Time Out: 0835 Total Time 35 Prolonged Time Billed  no      Greater than 50%  of this time was spent counseling and coordinating care related to the above assessment and plan.   Loistine Chance, MD  09/02/2014, 8:40 AM  Please contact Palliative Medicine Team phone at [phone] for questions and concerns.

## 2014-09-02 NOTE — ED Notes (Signed)
Pt. removed his warm blanket/gown and brief , redirected/assisted by nurse but refused to wear it.

## 2014-09-02 NOTE — ED Notes (Signed)
[place] Worker advised all of pts paperwork was faxed over  To [employer] [facility] [employer] for transfer of patient.

## 2014-09-02 NOTE — ED Notes (Signed)
Haldol given for restlessness/agitation .

## 2014-09-02 NOTE — Procedures (Signed)
Interventional Radiology Procedure Note  Procedure: Bedside removal of percutaneous gastrostomy.  Complications: None Recommendations:    - Routine   care   Signed,  Dulcy Fanny. Earleen Newport, DO

## 2014-09-04 LAB — URINE CULTURE

## 2014-09-05 ENCOUNTER — Telehealth: Payer: Self-pay | Admitting: *Deleted

## 2014-09-05 LAB — CULTURE, BLOOD (ROUTINE X 2)
CULTURE: NO GROWTH
Culture: NO GROWTH

## 2014-09-05 NOTE — Telephone Encounter (Signed)
Pt's daughter called to share that the patient passed away early this morning, 08/21/2014.

## 2014-09-05 NOTE — Telephone Encounter (Signed)
I am VERY sorry to hear that. I think the daughters mad the absolute correct decision to go to palliative care. Will also cc Dr. Megan Salon as well for when he returns to town

## 2014-09-13 DEATH — deceased

## 2014-09-18 ENCOUNTER — Encounter: Payer: Self-pay | Admitting: Internal Medicine

## 2016-06-17 IMAGING — RF DG FLUORO RM 1-60 MIN
2 series · 2 of 2 positions shown · IV contrast (agent unspecified)
Comparison: None

CLINICAL DATA: Nausea, question dysphagia

EXAM:
FLOURO RM 1-60 MIN
TECHNIQUE: Attempted esophagram.
Patient unable to drink thin barium by straw.
With table elevated 40 degrees, patient unable to drink by cup.
Small amount of contrast was instilled into the oral cavity with a
syringe.
CONTRAST:  Thin barium
FLUOROSCOPY TIME:  1 min 12 seconds

[Series 1: run · 1 of 1 slices shown (1 of 2)]
[im 1/1]
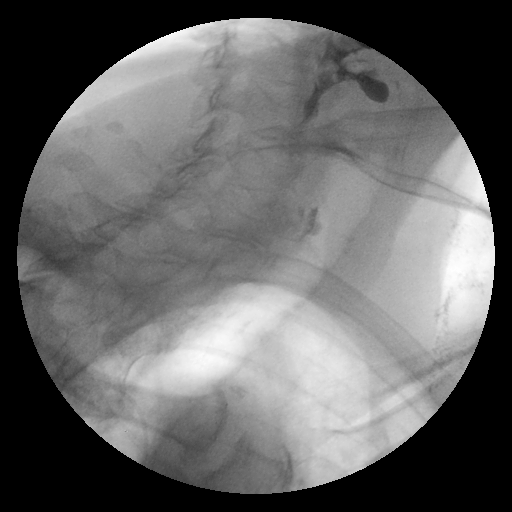

[Series 2: run · 1 of 1 slices shown (2 of 2)]
[im 1/1]
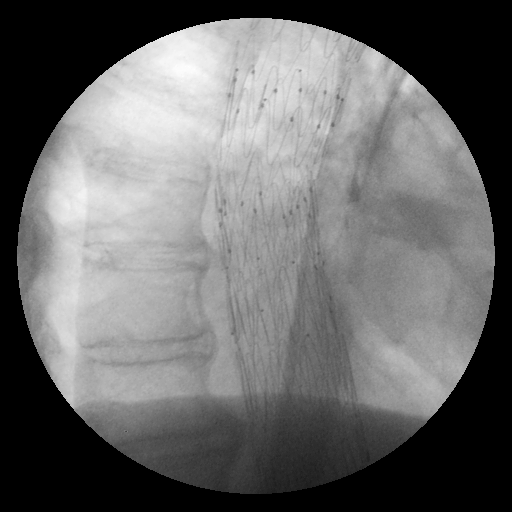

[2 of 2 positions shown; findings below may reference images not displayed]

FINDINGS: With a small amount of contrast instilled into oral cavity by
syringe, patient demonstrated prompt aspiration of a small amount of
contrast. Spontaneous cough reflex identified. Patient was unable to
swallow any contrast. Procedure terminated.
IMPRESSION: Aspiration of a small amount of contrast which was instilled into
the oral cavity by syringe.

Spontaneous cough reflex noted.

Unable to perform esophagram.

## 2016-06-19 IMAGING — CR DG CHEST 1V PORT
1 series · 1 of 1 positions shown · non-contrast
Comparison: January 11, 2014.

CLINICAL DATA: Weakness.

EXAM:
PORTABLE CHEST - 1 VIEW

[portable]
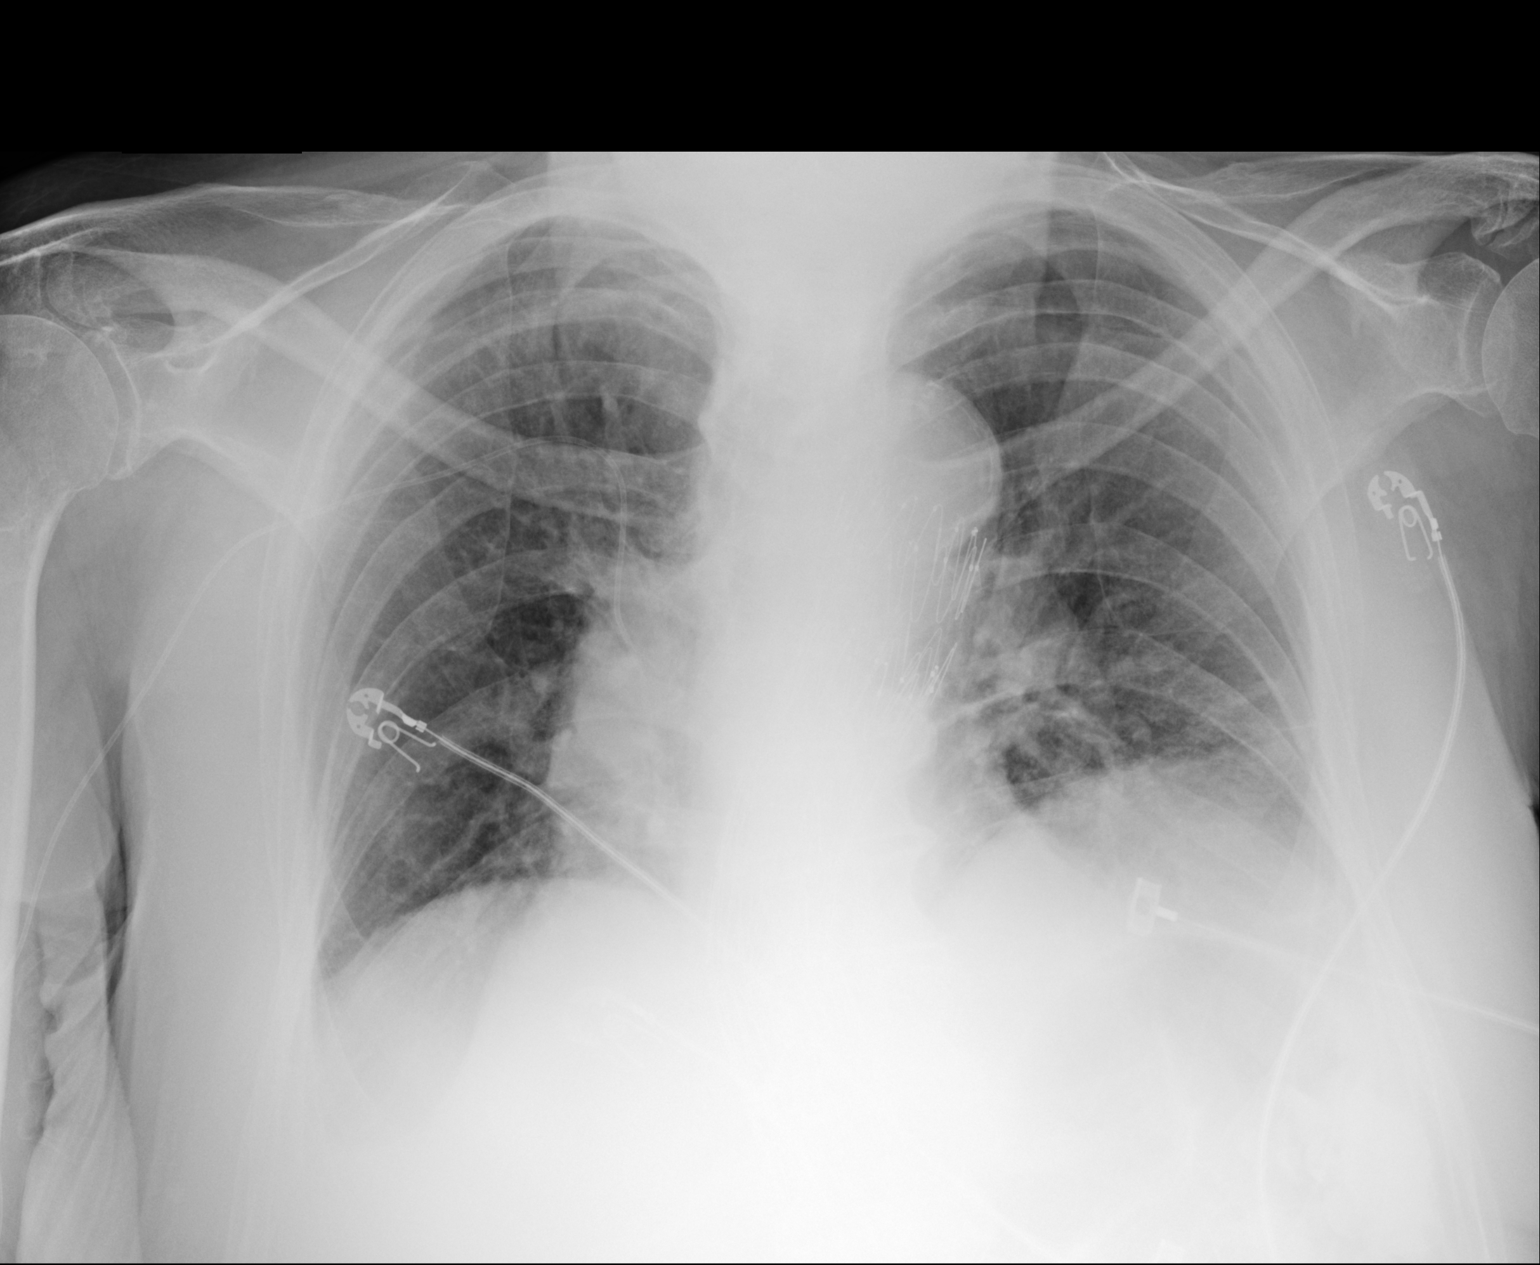

[1 of 1 positions shown; findings below may reference images not displayed]

FINDINGS: Stable cardiomediastinal silhouette. Stent graft is again noted
projected over descending thoracic aorta. No pneumothorax or
significant pleural effusion is noted. Hypoinflation of the lungs is
again noted. Right-sided PICC line is again noted and unchanged.
Minimal subsegmental atelectasis is noted in left lung base. Right
lung is clear. Bony thorax is intact.
IMPRESSION: Hypoinflation of the lungs is noted which is unchanged compared to
prior exam. Minimal left basilar subsegmental atelectasis is noted.

## 2016-06-23 IMAGING — CR DG CHEST 1V PORT
1 series · 1 of 1 positions shown · non-contrast
Comparison: 02/08/2014

CLINICAL DATA: Doppler of tube placement

EXAM:
PORTABLE CHEST - 1 VIEW

[portable]
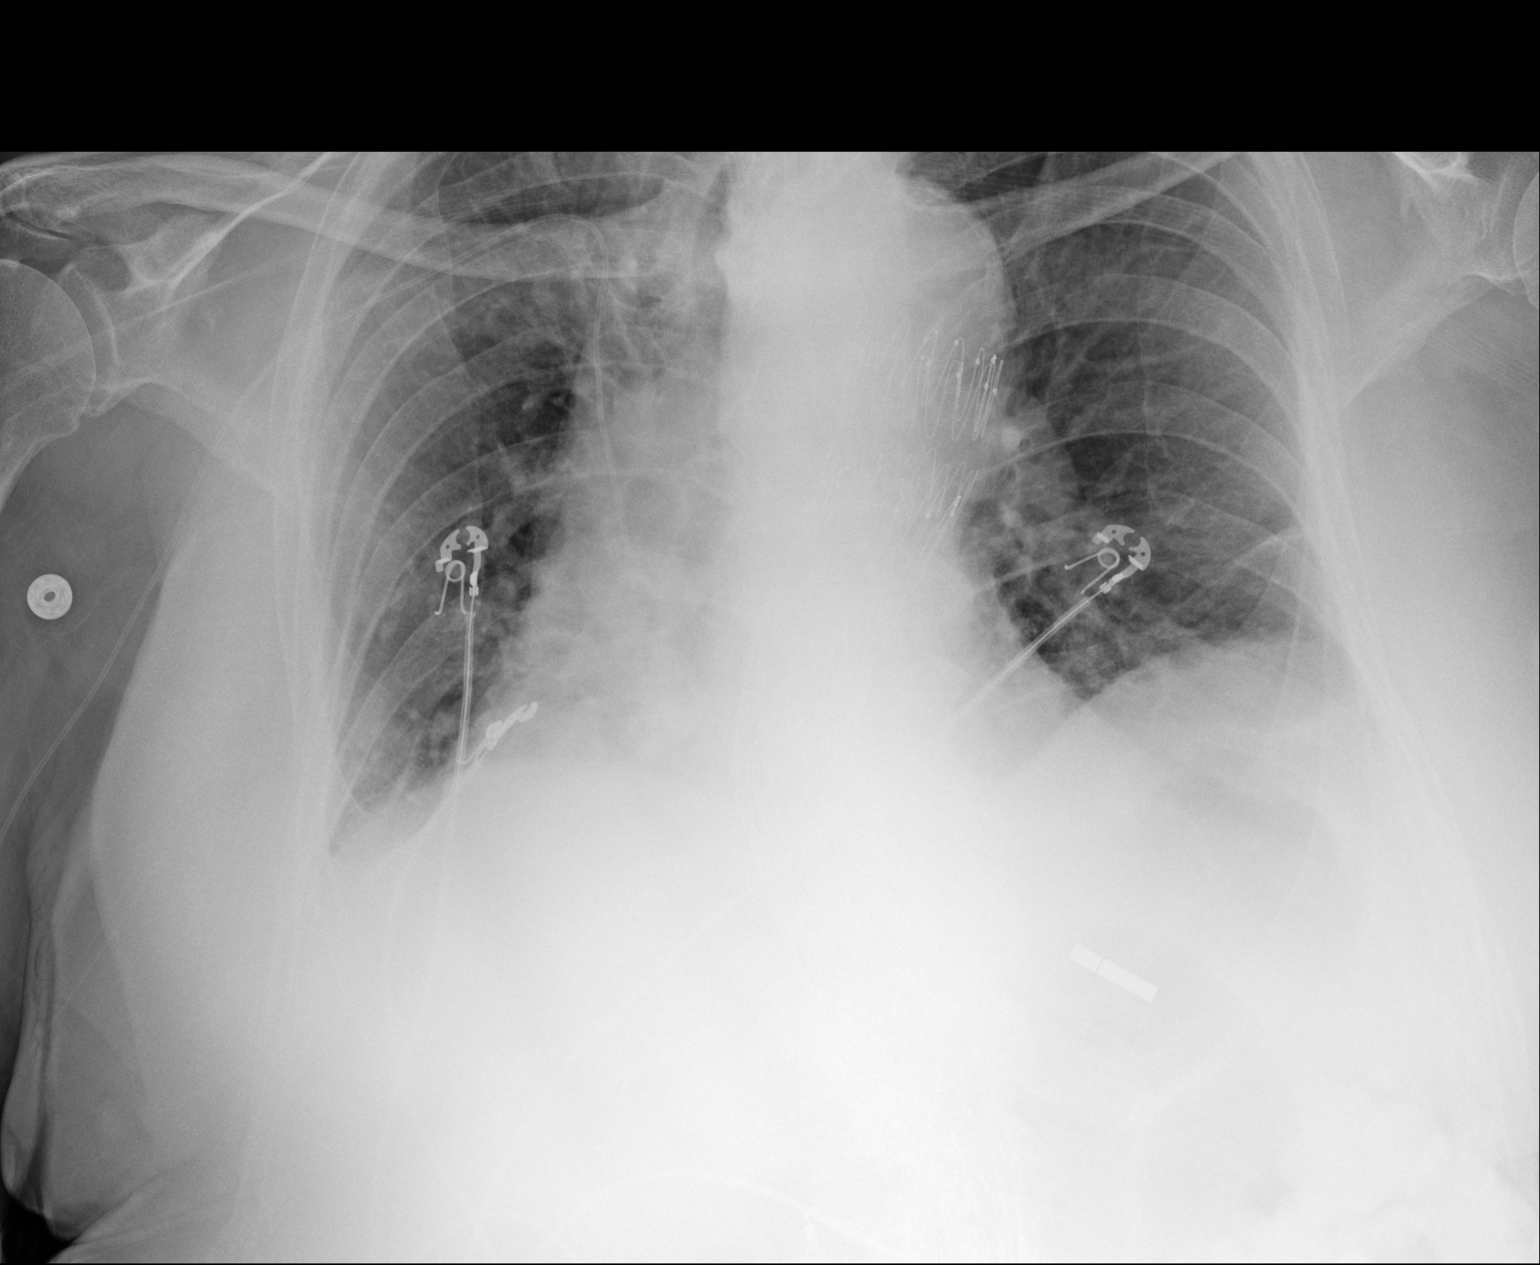

[1 of 1 positions shown; findings below may reference images not displayed]

FINDINGS: Cardiomediastinal silhouette is stable. Aortic stent is unchanged in
position. Stable right PICC line position. There is a NG feeding
tube with tip in proximal stomach below the level of diaphragm.
IMPRESSION: NG feeding tube with tip in proximal stomach below of the level of
diaphragm.

## 2016-12-12 IMAGING — CR DG CHEST 1V PORT
1 series · 1 of 1 positions shown · non-contrast
Comparison: 07/25/2014

CLINICAL DATA: 84-year-old male with a history of weakness.  PICC

EXAM:
PORTABLE CHEST - 1 VIEW

[AP]
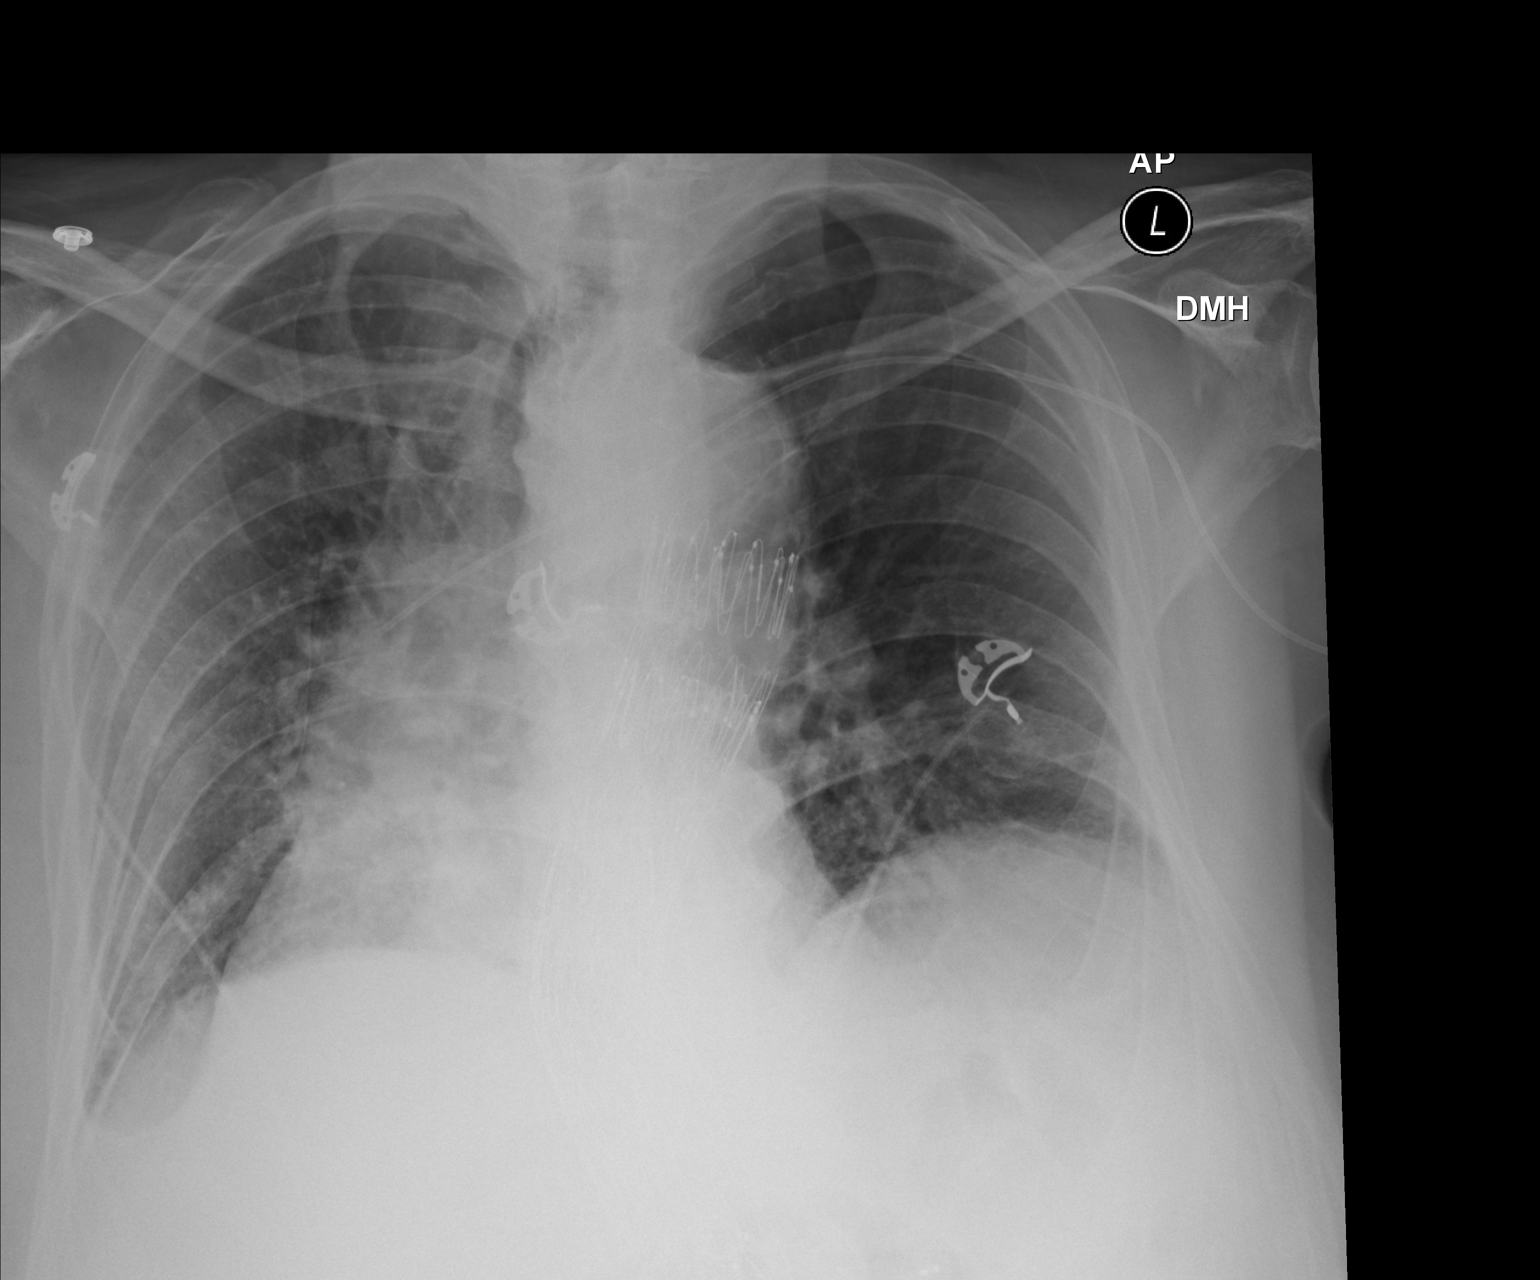

[1 of 1 positions shown; findings below may reference images not displayed]

FINDINGS: Cardiomediastinal silhouette unchanged.

Endovascular changes of prior thoracic aortic stent graft,
unchanged.

Unchanged position of left upper extremity PICC.

Low lung volumes. Persistent right basilar opacity with interstitial
opacities. No pneumothorax.
IMPRESSION: Persistently low lung volumes with atelectasis and/ at the right
greater than left bases.

Unchanged left upper extremity PICC.
